# Patient Record
Sex: Female | Born: 1950 | Race: Black or African American | Hispanic: No | Smoking: Former smoker
Health system: Southern US, Community
[De-identification: ages and names within clinical notes are randomized; demographics above are authoritative.]

## PROBLEM LIST (undated history)

## (undated) DIAGNOSIS — F419 Anxiety disorder, unspecified: Secondary | ICD-10-CM

## (undated) DIAGNOSIS — I10 Essential (primary) hypertension: Secondary | ICD-10-CM

## (undated) DIAGNOSIS — K449 Diaphragmatic hernia without obstruction or gangrene: Secondary | ICD-10-CM

## (undated) DIAGNOSIS — H8110 Benign paroxysmal vertigo, unspecified ear: Secondary | ICD-10-CM

## (undated) DIAGNOSIS — R222 Localized swelling, mass and lump, trunk: Secondary | ICD-10-CM

## (undated) DIAGNOSIS — E042 Nontoxic multinodular goiter: Secondary | ICD-10-CM

## (undated) DIAGNOSIS — F329 Major depressive disorder, single episode, unspecified: Secondary | ICD-10-CM

## (undated) DIAGNOSIS — J189 Pneumonia, unspecified organism: Secondary | ICD-10-CM

## (undated) DIAGNOSIS — I639 Cerebral infarction, unspecified: Secondary | ICD-10-CM

## (undated) DIAGNOSIS — N3281 Overactive bladder: Secondary | ICD-10-CM

## (undated) DIAGNOSIS — Z972 Presence of dental prosthetic device (complete) (partial): Secondary | ICD-10-CM

## (undated) DIAGNOSIS — G890 Central pain syndrome: Secondary | ICD-10-CM

## (undated) DIAGNOSIS — M1990 Unspecified osteoarthritis, unspecified site: Secondary | ICD-10-CM

## (undated) DIAGNOSIS — K279 Peptic ulcer, site unspecified, unspecified as acute or chronic, without hemorrhage or perforation: Secondary | ICD-10-CM

## (undated) DIAGNOSIS — I69354 Hemiplegia and hemiparesis following cerebral infarction affecting left non-dominant side: Secondary | ICD-10-CM

## (undated) DIAGNOSIS — R51 Headache: Secondary | ICD-10-CM

## (undated) DIAGNOSIS — I719 Aortic aneurysm of unspecified site, without rupture: Secondary | ICD-10-CM

## (undated) DIAGNOSIS — G8929 Other chronic pain: Secondary | ICD-10-CM

## (undated) DIAGNOSIS — Z973 Presence of spectacles and contact lenses: Secondary | ICD-10-CM

## (undated) DIAGNOSIS — Z90710 Acquired absence of both cervix and uterus: Secondary | ICD-10-CM

## (undated) DIAGNOSIS — E669 Obesity, unspecified: Secondary | ICD-10-CM

## (undated) DIAGNOSIS — R519 Headache, unspecified: Secondary | ICD-10-CM

## (undated) DIAGNOSIS — F32A Depression, unspecified: Secondary | ICD-10-CM

## (undated) DIAGNOSIS — Z862 Personal history of diseases of the blood and blood-forming organs and certain disorders involving the immune mechanism: Secondary | ICD-10-CM

## (undated) DIAGNOSIS — N3941 Urge incontinence: Secondary | ICD-10-CM

## (undated) DIAGNOSIS — I351 Nonrheumatic aortic (valve) insufficiency: Secondary | ICD-10-CM

## (undated) DIAGNOSIS — M4316 Spondylolisthesis, lumbar region: Secondary | ICD-10-CM

## (undated) DIAGNOSIS — K219 Gastro-esophageal reflux disease without esophagitis: Secondary | ICD-10-CM

## (undated) DIAGNOSIS — E785 Hyperlipidemia, unspecified: Secondary | ICD-10-CM

## (undated) DIAGNOSIS — N182 Chronic kidney disease, stage 2 (mild): Secondary | ICD-10-CM

## (undated) DIAGNOSIS — Z974 Presence of external hearing-aid: Secondary | ICD-10-CM

## (undated) HISTORY — PX: ABDOMINAL HYSTERECTOMY: SHX81

## (undated) HISTORY — DX: Cerebral infarction, unspecified: I63.9

## (undated) HISTORY — DX: Obesity, unspecified: E66.9

## (undated) HISTORY — DX: Peptic ulcer, site unspecified, unspecified as acute or chronic, without hemorrhage or perforation: K27.9

## (undated) HISTORY — DX: Hyperlipidemia, unspecified: E78.5

## (undated) HISTORY — DX: Morbid (severe) obesity due to excess calories: E66.01

## (undated) HISTORY — DX: Essential (primary) hypertension: I10

## (undated) HISTORY — PX: TUBAL LIGATION: SHX77

## (undated) HISTORY — DX: Nonrheumatic aortic (valve) insufficiency: I35.1

## (undated) HISTORY — DX: Acquired absence of both cervix and uterus: Z90.710

---

## 1998-05-23 ENCOUNTER — Emergency Department (HOSPITAL_COMMUNITY): Admission: EM | Admit: 1998-05-23 | Discharge: 1998-05-23 | Payer: Self-pay | Admitting: Emergency Medicine

## 1998-06-06 ENCOUNTER — Other Ambulatory Visit: Admission: RE | Admit: 1998-06-06 | Discharge: 1998-06-06 | Payer: Self-pay | Admitting: Family Medicine

## 1998-09-11 ENCOUNTER — Ambulatory Visit (HOSPITAL_COMMUNITY): Admission: RE | Admit: 1998-09-11 | Discharge: 1998-09-11 | Payer: Self-pay | Admitting: Obstetrics

## 1998-10-13 ENCOUNTER — Encounter: Admission: RE | Admit: 1998-10-13 | Discharge: 1998-10-13 | Payer: Self-pay | Admitting: Internal Medicine

## 1998-10-15 ENCOUNTER — Encounter: Admission: RE | Admit: 1998-10-15 | Discharge: 1998-10-15 | Payer: Self-pay | Admitting: Internal Medicine

## 1998-11-29 HISTORY — PX: TUBAL LIGATION: SHX77

## 1999-09-14 ENCOUNTER — Emergency Department (HOSPITAL_COMMUNITY): Admission: EM | Admit: 1999-09-14 | Discharge: 1999-09-14 | Payer: Self-pay | Admitting: Emergency Medicine

## 1999-09-16 ENCOUNTER — Ambulatory Visit (HOSPITAL_COMMUNITY): Admission: RE | Admit: 1999-09-16 | Discharge: 1999-09-16 | Payer: Self-pay | Admitting: Obstetrics

## 2000-06-07 ENCOUNTER — Emergency Department (HOSPITAL_COMMUNITY): Admission: EM | Admit: 2000-06-07 | Discharge: 2000-06-08 | Payer: Self-pay | Admitting: Emergency Medicine

## 2000-08-27 ENCOUNTER — Emergency Department (HOSPITAL_COMMUNITY): Admission: EM | Admit: 2000-08-27 | Discharge: 2000-08-27 | Payer: Self-pay | Admitting: Emergency Medicine

## 2000-09-20 ENCOUNTER — Ambulatory Visit (HOSPITAL_COMMUNITY): Admission: RE | Admit: 2000-09-20 | Discharge: 2000-09-20 | Payer: Self-pay | Admitting: Internal Medicine

## 2001-10-04 ENCOUNTER — Ambulatory Visit (HOSPITAL_COMMUNITY): Admission: RE | Admit: 2001-10-04 | Discharge: 2001-10-04 | Payer: Self-pay | Admitting: Internal Medicine

## 2001-11-29 DIAGNOSIS — Z8711 Personal history of peptic ulcer disease: Secondary | ICD-10-CM

## 2001-11-29 HISTORY — DX: Personal history of peptic ulcer disease: Z87.11

## 2002-04-06 ENCOUNTER — Emergency Department (HOSPITAL_COMMUNITY): Admission: EM | Admit: 2002-04-06 | Discharge: 2002-04-06 | Payer: Self-pay | Admitting: Emergency Medicine

## 2002-05-11 ENCOUNTER — Observation Stay (HOSPITAL_COMMUNITY): Admission: EM | Admit: 2002-05-11 | Discharge: 2002-05-12 | Payer: Self-pay | Admitting: Emergency Medicine

## 2002-07-06 ENCOUNTER — Ambulatory Visit (HOSPITAL_COMMUNITY): Admission: RE | Admit: 2002-07-06 | Discharge: 2002-07-06 | Payer: Self-pay | Admitting: Gastroenterology

## 2002-07-06 ENCOUNTER — Encounter (INDEPENDENT_AMBULATORY_CARE_PROVIDER_SITE_OTHER): Payer: Self-pay | Admitting: *Deleted

## 2002-10-21 ENCOUNTER — Emergency Department (HOSPITAL_COMMUNITY): Admission: EM | Admit: 2002-10-21 | Discharge: 2002-10-21 | Payer: Self-pay | Admitting: Emergency Medicine

## 2002-10-23 ENCOUNTER — Ambulatory Visit (HOSPITAL_COMMUNITY): Admission: RE | Admit: 2002-10-23 | Discharge: 2002-10-23 | Payer: Self-pay | Admitting: Gastroenterology

## 2004-05-31 ENCOUNTER — Emergency Department (HOSPITAL_COMMUNITY): Admission: EM | Admit: 2004-05-31 | Discharge: 2004-05-31 | Payer: Self-pay | Admitting: Emergency Medicine

## 2004-09-14 ENCOUNTER — Ambulatory Visit (HOSPITAL_COMMUNITY): Admission: RE | Admit: 2004-09-14 | Discharge: 2004-09-14 | Payer: Self-pay | Admitting: Internal Medicine

## 2004-12-17 ENCOUNTER — Ambulatory Visit: Payer: Self-pay | Admitting: *Deleted

## 2004-12-17 ENCOUNTER — Ambulatory Visit: Payer: Self-pay | Admitting: Family Medicine

## 2004-12-21 ENCOUNTER — Ambulatory Visit: Payer: Self-pay | Admitting: Family Medicine

## 2004-12-25 ENCOUNTER — Emergency Department (HOSPITAL_COMMUNITY): Admission: EM | Admit: 2004-12-25 | Discharge: 2004-12-26 | Payer: Self-pay | Admitting: Emergency Medicine

## 2005-02-02 ENCOUNTER — Ambulatory Visit: Payer: Self-pay | Admitting: Family Medicine

## 2005-02-04 ENCOUNTER — Ambulatory Visit: Payer: Self-pay | Admitting: Family Medicine

## 2005-05-19 ENCOUNTER — Ambulatory Visit: Payer: Self-pay | Admitting: Family Medicine

## 2005-06-11 ENCOUNTER — Ambulatory Visit: Payer: Self-pay | Admitting: Family Medicine

## 2005-06-14 ENCOUNTER — Emergency Department (HOSPITAL_COMMUNITY): Admission: EM | Admit: 2005-06-14 | Discharge: 2005-06-14 | Payer: Self-pay | Admitting: Emergency Medicine

## 2005-06-17 ENCOUNTER — Emergency Department (HOSPITAL_COMMUNITY): Admission: EM | Admit: 2005-06-17 | Discharge: 2005-06-17 | Payer: Self-pay | Admitting: Emergency Medicine

## 2005-06-22 ENCOUNTER — Emergency Department (HOSPITAL_COMMUNITY): Admission: EM | Admit: 2005-06-22 | Discharge: 2005-06-22 | Payer: Self-pay | Admitting: Family Medicine

## 2005-09-16 ENCOUNTER — Ambulatory Visit (HOSPITAL_COMMUNITY): Admission: RE | Admit: 2005-09-16 | Discharge: 2005-09-16 | Payer: Self-pay | Admitting: Family Medicine

## 2006-03-29 DIAGNOSIS — I6930 Unspecified sequelae of cerebral infarction: Secondary | ICD-10-CM

## 2006-03-29 HISTORY — DX: Unspecified sequelae of cerebral infarction: I69.30

## 2006-04-17 ENCOUNTER — Inpatient Hospital Stay (HOSPITAL_COMMUNITY): Admission: AD | Admit: 2006-04-17 | Discharge: 2006-04-21 | Payer: Self-pay | Admitting: Neurology

## 2006-04-17 ENCOUNTER — Encounter: Payer: Self-pay | Admitting: Emergency Medicine

## 2006-04-19 ENCOUNTER — Ambulatory Visit: Payer: Self-pay | Admitting: Physical Medicine & Rehabilitation

## 2006-04-21 ENCOUNTER — Inpatient Hospital Stay (HOSPITAL_COMMUNITY)
Admission: RE | Admit: 2006-04-21 | Discharge: 2006-05-05 | Payer: Self-pay | Admitting: Physical Medicine & Rehabilitation

## 2006-09-23 ENCOUNTER — Ambulatory Visit (HOSPITAL_COMMUNITY): Admission: RE | Admit: 2006-09-23 | Discharge: 2006-09-23 | Payer: Self-pay | Admitting: Family Medicine

## 2007-03-01 ENCOUNTER — Inpatient Hospital Stay (HOSPITAL_COMMUNITY): Admission: EM | Admit: 2007-03-01 | Discharge: 2007-03-02 | Payer: Self-pay | Admitting: Emergency Medicine

## 2007-03-01 ENCOUNTER — Encounter: Payer: Self-pay | Admitting: Cardiology

## 2007-03-01 ENCOUNTER — Ambulatory Visit: Payer: Self-pay | Admitting: Cardiology

## 2007-04-23 ENCOUNTER — Emergency Department (HOSPITAL_COMMUNITY): Admission: EM | Admit: 2007-04-23 | Discharge: 2007-04-23 | Payer: Self-pay | Admitting: Emergency Medicine

## 2007-09-26 ENCOUNTER — Ambulatory Visit (HOSPITAL_COMMUNITY): Admission: RE | Admit: 2007-09-26 | Discharge: 2007-09-26 | Payer: Self-pay | Admitting: Family Medicine

## 2008-09-17 ENCOUNTER — Encounter: Admission: RE | Admit: 2008-09-17 | Discharge: 2008-09-17 | Payer: Self-pay | Admitting: Family Medicine

## 2008-10-03 ENCOUNTER — Ambulatory Visit (HOSPITAL_COMMUNITY): Admission: RE | Admit: 2008-10-03 | Discharge: 2008-10-03 | Payer: Self-pay | Admitting: Family Medicine

## 2008-11-20 ENCOUNTER — Encounter: Admission: RE | Admit: 2008-11-20 | Discharge: 2008-11-20 | Payer: Self-pay | Admitting: Family Medicine

## 2009-08-09 ENCOUNTER — Emergency Department (HOSPITAL_COMMUNITY): Admission: EM | Admit: 2009-08-09 | Discharge: 2009-08-09 | Payer: Self-pay | Admitting: Emergency Medicine

## 2009-10-07 ENCOUNTER — Ambulatory Visit (HOSPITAL_COMMUNITY): Admission: RE | Admit: 2009-10-07 | Discharge: 2009-10-07 | Payer: Self-pay | Admitting: Family Medicine

## 2009-12-05 ENCOUNTER — Ambulatory Visit: Payer: Self-pay | Admitting: Cardiology

## 2009-12-05 ENCOUNTER — Observation Stay (HOSPITAL_COMMUNITY): Admission: EM | Admit: 2009-12-05 | Discharge: 2009-12-07 | Payer: Self-pay | Admitting: Emergency Medicine

## 2009-12-07 ENCOUNTER — Encounter: Payer: Self-pay | Admitting: Cardiology

## 2009-12-24 DIAGNOSIS — E785 Hyperlipidemia, unspecified: Secondary | ICD-10-CM | POA: Insufficient documentation

## 2009-12-24 DIAGNOSIS — Z8679 Personal history of other diseases of the circulatory system: Secondary | ICD-10-CM | POA: Insufficient documentation

## 2009-12-24 DIAGNOSIS — I10 Essential (primary) hypertension: Secondary | ICD-10-CM | POA: Insufficient documentation

## 2009-12-24 DIAGNOSIS — Z8711 Personal history of peptic ulcer disease: Secondary | ICD-10-CM | POA: Insufficient documentation

## 2009-12-24 DIAGNOSIS — E669 Obesity, unspecified: Secondary | ICD-10-CM | POA: Insufficient documentation

## 2009-12-25 ENCOUNTER — Telehealth (INDEPENDENT_AMBULATORY_CARE_PROVIDER_SITE_OTHER): Payer: Self-pay

## 2009-12-29 ENCOUNTER — Encounter: Payer: Self-pay | Admitting: Cardiology

## 2009-12-29 ENCOUNTER — Ambulatory Visit: Payer: Self-pay

## 2009-12-29 ENCOUNTER — Encounter (HOSPITAL_COMMUNITY): Admission: RE | Admit: 2009-12-29 | Discharge: 2010-03-04 | Payer: Self-pay | Admitting: Cardiology

## 2009-12-29 ENCOUNTER — Ambulatory Visit (HOSPITAL_COMMUNITY): Admission: RE | Admit: 2009-12-29 | Discharge: 2009-12-29 | Payer: Self-pay | Admitting: Cardiology

## 2009-12-29 ENCOUNTER — Ambulatory Visit: Payer: Self-pay | Admitting: Cardiology

## 2010-01-06 ENCOUNTER — Ambulatory Visit: Payer: Self-pay | Admitting: Cardiology

## 2010-01-06 DIAGNOSIS — E041 Nontoxic single thyroid nodule: Secondary | ICD-10-CM | POA: Insufficient documentation

## 2010-01-06 DIAGNOSIS — E876 Hypokalemia: Secondary | ICD-10-CM | POA: Insufficient documentation

## 2010-01-09 ENCOUNTER — Ambulatory Visit (HOSPITAL_COMMUNITY): Admission: RE | Admit: 2010-01-09 | Discharge: 2010-01-09 | Payer: Self-pay | Admitting: Cardiology

## 2010-01-09 LAB — CONVERTED CEMR LAB
BUN: 15 mg/dL (ref 6–23)
CO2: 31 meq/L (ref 19–32)
Calcium: 9.4 mg/dL (ref 8.4–10.5)
Chloride: 105 meq/L (ref 96–112)
Creatinine, Ser: 1.1 mg/dL (ref 0.4–1.2)
GFR calc non Af Amer: 65.61 mL/min (ref >60)
Glucose, Bld: 88 mg/dL (ref 70–99)
Potassium: 3.8 meq/L (ref 3.5–5.1)
Sodium: 142 meq/L (ref 135–145)

## 2010-02-02 ENCOUNTER — Ambulatory Visit: Payer: Self-pay | Admitting: Cardiology

## 2010-02-11 LAB — CONVERTED CEMR LAB
BUN: 18 mg/dL (ref 6–23)
CO2: 31 meq/L (ref 19–32)
Calcium: 9.3 mg/dL (ref 8.4–10.5)
Chloride: 107 meq/L (ref 96–112)
Creatinine, Ser: 1.3 mg/dL — ABNORMAL HIGH (ref 0.4–1.2)
GFR calc non Af Amer: 54.09 mL/min (ref >60)
Glucose, Bld: 94 mg/dL (ref 70–99)
Potassium: 3.8 meq/L (ref 3.5–5.1)
Sodium: 143 meq/L (ref 135–145)

## 2010-07-29 ENCOUNTER — Ambulatory Visit: Payer: Self-pay | Admitting: Cardiovascular Disease

## 2010-07-29 ENCOUNTER — Ambulatory Visit: Payer: Self-pay

## 2010-07-29 ENCOUNTER — Ambulatory Visit (HOSPITAL_COMMUNITY): Admission: RE | Admit: 2010-07-29 | Discharge: 2010-07-29 | Payer: Self-pay | Admitting: Cardiology

## 2010-07-29 ENCOUNTER — Ambulatory Visit: Payer: Self-pay | Admitting: Cardiology

## 2010-07-29 ENCOUNTER — Encounter: Payer: Self-pay | Admitting: Cardiology

## 2010-07-29 DIAGNOSIS — R072 Precordial pain: Secondary | ICD-10-CM | POA: Insufficient documentation

## 2010-07-29 DIAGNOSIS — I359 Nonrheumatic aortic valve disorder, unspecified: Secondary | ICD-10-CM | POA: Insufficient documentation

## 2010-08-05 ENCOUNTER — Ambulatory Visit: Payer: Self-pay | Admitting: Cardiology

## 2010-08-06 ENCOUNTER — Telehealth: Payer: Self-pay | Admitting: Cardiology

## 2010-08-07 ENCOUNTER — Ambulatory Visit: Payer: Self-pay | Admitting: Cardiology

## 2010-08-07 ENCOUNTER — Inpatient Hospital Stay (HOSPITAL_BASED_OUTPATIENT_CLINIC_OR_DEPARTMENT_OTHER): Admission: RE | Admit: 2010-08-07 | Discharge: 2010-08-07 | Payer: Self-pay | Admitting: Cardiology

## 2010-08-07 HISTORY — PX: CARDIAC CATHETERIZATION: SHX172

## 2010-08-09 ENCOUNTER — Telehealth: Payer: Self-pay | Admitting: Adult Health

## 2010-08-10 ENCOUNTER — Telehealth: Payer: Self-pay | Admitting: Cardiology

## 2010-08-11 ENCOUNTER — Ambulatory Visit: Payer: Self-pay | Admitting: Cardiology

## 2010-08-12 ENCOUNTER — Telehealth: Payer: Self-pay | Admitting: Cardiology

## 2010-08-16 LAB — CONVERTED CEMR LAB
BUN: 23 mg/dL (ref 6–23)
Basophils Absolute: 0 10*3/uL (ref 0.0–0.1)
Basophils Relative: 0.6 % (ref 0.0–3.0)
CO2: 29 meq/L (ref 19–32)
Calcium: 9.3 mg/dL (ref 8.4–10.5)
Chloride: 103 meq/L (ref 96–112)
Creatinine, Ser: 1.3 mg/dL — ABNORMAL HIGH (ref 0.4–1.2)
Eosinophils Absolute: 0.2 10*3/uL (ref 0.0–0.7)
Eosinophils Relative: 4 % (ref 0.0–5.0)
GFR calc non Af Amer: 54.97 mL/min (ref >60)
Glucose, Bld: 83 mg/dL (ref 70–99)
HCT: 35.4 % — ABNORMAL LOW (ref 36.0–46.0)
Hemoglobin: 11.9 g/dL — ABNORMAL LOW (ref 12.0–15.0)
INR: 1 (ref 0.8–1.0)
Lymphocytes Relative: 25.7 % (ref 12.0–46.0)
Lymphs Abs: 1.2 10*3/uL (ref 0.7–4.0)
MCHC: 33.7 g/dL (ref 30.0–36.0)
MCV: 90 fL (ref 78.0–100.0)
Monocytes Absolute: 0.4 10*3/uL (ref 0.1–1.0)
Monocytes Relative: 9.2 % (ref 3.0–12.0)
Neutro Abs: 2.8 10*3/uL (ref 1.4–7.7)
Neutrophils Relative %: 60.5 % (ref 43.0–77.0)
Platelets: 341 10*3/uL (ref 150.0–400.0)
Potassium: 4.3 meq/L (ref 3.5–5.1)
Prothrombin Time: 10.6 s (ref 9.7–11.8)
RBC: 3.94 M/uL (ref 3.87–5.11)
RDW: 13.6 % (ref 11.5–14.6)
Sodium: 141 meq/L (ref 135–145)
WBC: 4.7 10*3/uL (ref 4.5–10.5)

## 2010-09-21 ENCOUNTER — Ambulatory Visit: Payer: Self-pay | Admitting: Cardiology

## 2010-09-22 ENCOUNTER — Encounter: Payer: Self-pay | Admitting: Cardiology

## 2010-10-12 ENCOUNTER — Ambulatory Visit (HOSPITAL_COMMUNITY): Admission: RE | Admit: 2010-10-12 | Discharge: 2010-10-12 | Payer: Self-pay | Admitting: Family Medicine

## 2010-12-29 NOTE — Assessment & Plan Note (Signed)
Summary: eph/post myoview/echo and carotid/lg   Visit Type:  Post-Hospital  CC:  No cardiac complains.  History of Present Illness: Has been doing well since she went home.  She follows her grandkids.  She feels better except for a back ache.  Patient was admitted to the hospital for chest pain.  She was seen by Dr. Dietrich Pates, who arranged OP evaluation.  Has not been seen in some time.  Has been doing well since time of discharge.  Current Medications (verified): 1)  Aspirin 81 Mg Tbec (Aspirin) .... Take One Tablet By Mouth Daily 2)  Bumetanide 1 Mg Tabs (Bumetanide) .... Take 1 Tablet By Mouth Once A Day 3)  Cardura 8 Mg Tabs (Doxazosin Mesylate) .... Take 1 Tab By Mouth At Bedtime 4)  Clonidine Hcl 0.3 Mg Tabs (Clonidine Hcl) .... Take One Tablet By Mouth Three Times A Day 5)  Enablex 7.5 Mg Xr24h-Tab (Darifenacin Hydrobromide) .... Take 1 Tablet By Mouth Once A Day 6)  Exforge 10-160 Mg Tabs (Amlodipine Besylate-Valsartan) .... Take 1 Tablet By Mouth Once A Day 7)  Furosemide 40 Mg Tabs (Furosemide) .... Take One Tablet By Mouth Daily. 8)  Lisinopril 10 Mg Tabs (Lisinopril) .... Take One Tablet By Mouth Daily 9)  Multivitamins  Tabs (Multiple Vitamin) .... Take 1 Tablet By Mouth Once A Day 10)  Omeprazole 20 Mg Tbec (Omeprazole) .... Take 1 Tablet By Mouth Two Times A Day 11)  Verapamil Hcl 80 Mg Tabs (Verapamil Hcl) .... Take 1 Tablet By Mouth Two Times A Day 12)  Vitamin B-12 100 Mcg Tabs (Cyanocobalamin) .... Take 1 Tablet By Mouth Once A Day 13)  Zetia 10 Mg Tabs (Ezetimibe) .... Take One Tablet By Mouth Daily.  Allergies (verified): No Known Drug Allergies  Vital Signs:  Patient profile:   60 year old female Height:      67 inches Weight:      197.50 pounds BMI:     31.04 Pulse rate:   76 / minute Pulse rhythm:   regular Resp:     18 per minute BP sitting:   108 / 64  (right arm) Cuff size:   large  Vitals Entered By: Vikki Ports (January 06, 2010 12:39  PM)  Physical Exam  General:  Well developed, well nourished, in no acute distress. Head:  normocephalic and atraumatic Lungs:  Clear bilaterally to auscultation and percussion. Heart:  PMI non displaced.  Minimal apical murmur, no loud MR heard. Abdomen:  Bowel sounds positive; abdomen soft and non-tender without masses, organomegaly, or hernias noted. No hepatosplenomegaly. Pulses:  pulses normal in all 4 extremities Extremities:  No clubbing or cyanosis. Neurologic:  Alert and oriented x 3.   Echocardiogram  Procedure date:  12/29/2009  Findings:       Study Conclusions            - Left ventricle: The cavity size was normal. Wall thickness was       normal. The estimated ejection fraction was 60%. Wall motion was       normal; there were no regional wall motion abnormalities.     - Aortic valve: The aortic valve is trileaflet. There is a central       AI jet. The AI is mild/moderate.     - Mitral valve: Mild regurgitation.     - Left atrium: The atrium was mildly dilated.     - Tricuspid valve: Mild-moderate regurgitation.  Nuclear ETT  Procedure date:  12/29/2009  Findings:       QPS  Raw Data Images:  Normal; no motion artifact; normal heart/lung ratio. Stress Images:  There is normal uptake in all areas. Rest Images:  Normal homogeneous uptake in all areas of the myocardium. Subtraction (SDS):  No evidence of ischemia. Transient Ischemic Dilatation:  1.20  (Normal <1.22)  Lung/Heart Ratio:  .24  (Normal <0.45)  Quantitative Gated Spect Images  QGS EDV:  119 ml QGS ESV:  55 ml QGS EF:  54 % QGS cine images:  Normal  Carotid Doppler  Procedure date:  12/29/2009  Findings:      Normal Carotids. Bilateral Thyroid Cysts.  Referred for thyroid US and discussed with Dr. Gery Pray.  Impression & Recommendations:  Problem # 1:  CHEST PAIN, HX OF (ICD-V12.50) See nuc study.  No evidence of ischemia by evaluation.  Continue to follow up in six months.  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 Orders: Misc. Referral (Misc. Ref)  Problem # 3:  HYPOKALEMIA (ICD-276.8) Recheck Orders: TLB-BMP (Basic Metabolic Panel-BMET) (80048-METABOL)  Problem # 4:  HYPERTENSION, UNSPECIFIED (ICD-401.9) Continue to monitor BP.  Check k Her updated medication list for this problem includes:    Aspirin 81 Mg Tbec (Aspirin) .Marland Kitchen... Take one tablet by mouth daily    Bumetanide 1 Mg Tabs (Bumetanide) .Marland Kitchen... Take 1 tablet by mouth once a day    Cardura 8 Mg Tabs (Doxazosin mesylate) .Marland Kitchen... Take 1 tab by mouth at bedtime    Clonidine Hcl 0.3 Mg Tabs (Clonidine hcl) .Marland Kitchen... Take one tablet by mouth three times a day    Exforge 10-160 Mg Tabs (Amlodipine besylate-valsartan) .Marland Kitchen... Take 1 tablet by mouth once a day    Furosemide 40 Mg Tabs (Furosemide) .Marland Kitchen... Take one tablet by mouth daily.    Lisinopril 10 Mg Tabs (Lisinopril) .Marland Kitchen... Take one tablet by mouth daily    Verapamil Hcl 80 Mg Tabs (Verapamil hcl) .Marland Kitchen... Take 1 tablet by mouth two times a day  Orders: Echocardiogram (Echo)  Patient Instructions: 1)  Your physician recommends that you schedule a follow-up appointment in: 6 months with Dr. Riley Kill 2)  Your physician recommends that you get lab work today to check your potassium level. 3)  You have been referred to [facility] to have an ultrasound of the thyroid to evaluate your thyroid cysts. 4)  Your physician has requested that you have an echocardiogram in 6 months, prior to your next appointment.  Echocardiography is a painless test that uses sound waves to create images of your heart. It provides your doctor with information about the size and shape of your heart and how well your heart's chambers and valves are working.  This procedure takes approximately one hour. There are no restrictions for this procedure.

## 2010-12-29 NOTE — Progress Notes (Signed)
Summary: CP, med clarification  Phone Note Outgoing Call   Call placed by: Julieta Gutting, RN, BSN,  August 06, 2010 11:44 AM Call placed to: Patient Summary of Call: I spoke with the pt and she is not taking Furosemide. The pt does take Bumetanide 1mg  daily.  The pt made me aware that she had CP on Tuesay, Wednesday night, and is currently having pain.  I advised the pt that she should go to the ER now and not wait for her cath tomorrow.  The pt does not want to go into the hospital today.  I once again advised the pt that she should seek treatment in the ER for her symptoms.  The pt said she would go to the ER if her symptoms worsened. The pt said she has been taking 4 baby ASA daily for pain. Initial call taken by: Julieta Gutting, RN, BSN,  August 06, 2010 11:45 AM

## 2010-12-29 NOTE — Consult Note (Signed)
Summary: Consultation Report - [facility]  Consultation Report - [facility]   Imported By: Marylou Mccoy 12/26/2009 09:02:43  _____________________________________________________________________  External Attachment:    Type:   Image     Comment:   External Document

## 2010-12-29 NOTE — Assessment & Plan Note (Signed)
Summary: Cardiology Nuclear Study  Nuclear Med Background Indications for Stress Test: Evaluation for Ischemia, Post Hospital  Indications Comments: 12/05/09 to 12/07/09 CP, MI ruled out.  History: Echo  History Comments: '08 Echo: EF=55-60%, Mild AI.  Symptoms: Chest Pain, Chest Pressure, SOB  Symptoms Comments: Radiates to Left shoulder.   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 Edwards,RN.   Nuclear Med Study 1 or 2 day study:  1 day     Stress Test Type:  Eugenie Birks Reading MD:  Willa Rough, MD     Referring MD:  T.Stuckey Resting Radionuclide:  Technetium 42m Tetrofosmin     Resting Radionuclide Dose:  11.0 mCi  Stress Radionuclide:  Technetium 35m Tetrofosmin     Stress Radionuclide Dose:  33.0 mCi   Stress Protocol   Lexiscan: 0.4 mg   Stress Test Technologist:  Milana Na EMT-P     Nuclear Technologist:  Burna Mortimer Deal RT-N  Rest Procedure  Myocardial perfusion imaging was performed at rest 45 minutes following the intravenous administration of Myoview Technetium 53m Tetrofosmin.  Stress Procedure  The patient received IV Lexiscan 0.4 mg over 15-seconds.  Myoview injected at 30-seconds.  There were no significant changes with infusion.  Quantitative spect images were obtained after a 45 minute delay.  QPS Raw Data Images:  Normal; no motion artifact; normal heart/lung ratio. Stress Images:  There is normal uptake in all areas. Rest Images:  Normal homogeneous uptake in all areas of the myocardium. Subtraction (SDS):  No evidence of ischemia. Transient Ischemic Dilatation:  1.20  (Normal  <1.22)  Lung/Heart Ratio:  .24  (Normal <0.45)  Quantitative Gated Spect Images QGS EDV:  119 ml QGS ESV:  55 ml QGS EF:  54 % QGS cine images:  Normal  Findings Normal nuclear study      Overall Impression  Exercise Capacity: Lexiscan myoview BP Response: Normal blood pressure response. Clinical Symptoms: headache ECG Impression: No significant ST segment change suggestive of ischemia. Overall Impression: Normal stress nuclear study.  Appended Document: Cardiology Nuclear Study Report of study reviewed with patient at time of office visit.  TS

## 2010-12-29 NOTE — Progress Notes (Signed)
Summary: Nuc. Pre-Procedure  Phone Note Outgoing Call Call back at [facility] Phone [phone]   Call placed by: Irean Hong, RN,  December 25, 2009 1:44 PM Summary of Call: Reviewed information on Myoview Information Sheet (see scanned document for further details).  Spoke with patient.     Nuclear Med Background Indications for Stress Test: Evaluation for Ischemia, Post Hospital  Indications Comments: 12/05/09 to 12/07/09 CP, MI ruled out.  History: Echo  History Comments: '08 Echo: EF=55-60%, Mild AI.  Symptoms: Chest Pain, Chest Pressure, SOB  Symptoms Comments: Radiates to Left shoulder.   Nuclear Pre-Procedure Cardiac Risk Factors: CVA, Hypertension, Obesity

## 2010-12-29 NOTE — Miscellaneous (Signed)
Summary: Orders Update  Clinical Lists Changes  Orders: Added new Test order of Carotid Duplex (Carotid Duplex) - Signed 

## 2010-12-29 NOTE — Progress Notes (Signed)
Summary: talk to nurse re visit yesterday  Phone Note Call from Patient   Caller: Patient Reason for Call: Talk to Nurse Summary of Call: pt wants a call re her visit yesterday-pls call [phone] Initial call taken by: Glynda Jaeger,  August 12, 2010 11:24 AM  Follow-up for Phone Call        The pt called because she was wondering what she needs to have done for her back pain.  Dr Riley Kill did speak with her about this issue at her OV. He instructed the pt to discuss these concerns with Dr Parke Simmers and that she may require further evaluation of her back by MRI.  Dr Riley Kill said he would touch base with Dr Parke Simmers prior to the pt's appt on Monday.  Follow-up by: Julieta Gutting, RN, BSN,  August 12, 2010 12:36 PM

## 2010-12-29 NOTE — Letter (Signed)
Summary: Cardiac Catheterization Instructions- [facility]  [employer], Main Office  [address]   [place], [place] [address]   Phone: [phone]  Fax: [phone]     07/29/2010 MRN: [id number]  ZEPHYRA BERNARDI [address] [place], [place]  [address]  Dear Ms. Slevin,   You are scheduled for a Cardiac Catheterization on August 07, 2010 with Dr.Sherelle Castelli. Please arrive to the 1st floor of the Heart and Vascular Center at [facility] at 7:30 am  on the day of your procedure. Please do not arrive before 6:30 a.m. Call the Heart and Vascular Center at [phone] if you are unable to make your appointmnet. The Code to get into the parking garage under the building is 0020_. Take the elevators to the 1st floor. You must have someone to drive you home. Someone must be with you for the first 24 hours after you arrive home. Please wear clothes that are easy to get on and off and wear slip-on shoes. Do not eat or drink after midnight except water with your medications that morning. Bring all your medications and current insurance cards with you.   __X_ Make sure you take your aspirin.  _X__ You may take ALL of your medications with water that morning. _ Please come to the office on August 05, 2010 for lab work. The lab opens at 8:30 and closes from 2:00-2:30. It is open until 4:15.  The usual length of stay after your procedure is 2 to 3 hours. This can vary.  If you have any questions, please call the office at the number listed above.   Dossie Arbour, RN, BSN

## 2010-12-29 NOTE — Progress Notes (Signed)
  Phone Note Call from Patient   Action Taken: Phone Call Completed Details for Reason: Chest pain  Summary of Call: Recieved a page to return call of Chelsea [name].  I called back twice and no answer.  She called back and reported that she was having chest pain, only when she was lying down.  She has had a cardiac catherization on 08/07/2010 and was found to have normal coronary anatomy.  She is not SOB, diaphoretic or dizzy.  I have reassured her that it appeared to be musculoskeletal and she should take tylenol or advil for relief.  She has an appointment this week with Dr. Riley Kill.  I have assured her that I would pass on this information to him to discuss with her on that visit. Initial call taken by: Joni Reining NP

## 2010-12-29 NOTE — Cardiovascular Report (Signed)
Summary: Pre Cath Orders   Pre Cath Orders   Imported By: Roderic Ovens 08/17/2010 13:29:22  _____________________________________________________________________  External Attachment:    Type:   Image     Comment:   External Document

## 2010-12-29 NOTE — Assessment & Plan Note (Signed)
Summary: eph   Visit Type:  Post-hospital  CC:  Chest discomfort- Left arm warm funny feeling goes down to left leg- Some bleeding from the cath site  and Sob.  History of Present Illness: Ms. Cummings comes in for followup after undergoing cardiac cath.  This was done in part to a possible change in wall motion on echo.  Cath revealed no significant obstruction, and also preserved LV function.  AI was mild, and right heart pressures normal.  Previous workup has included CT.  She continues to have episodes of chest pain.  They radiate into the back, and there is some tenderness.  We had held off for quite some time in doing cath, but recent symptoms seem more prominent.  She has been concerned.  We spend a long time today discussing this.  She is to see Dr. Parke Simmers on Monday.  There is not prominent shortness of breath, nausea, or vomiting.  GI symptoms are not prominent.   Current Medications (verified): 1)  Aspirin 81 Mg Tbec (Aspirin) .... Take One Tablet By Mouth Daily 2)  Bumetanide 1 Mg Tabs (Bumetanide) .... Take 1 Tablet By Mouth Once A Day 3)  Cardura 8 Mg Tabs (Doxazosin Mesylate) .... Take 1 Tab By Mouth At Bedtime 4)  Clonidine Hcl 0.3 Mg Tabs (Clonidine Hcl) .... Take One Tablet By Mouth Three Times A Day 5)  Enablex 7.5 Mg Xr24h-Tab (Darifenacin Hydrobromide) .... Take 1 Tablet By Mouth Once A Day 6)  Exforge 10-160 Mg Tabs (Amlodipine Besylate-Valsartan) .... Take 1 Tablet By Mouth Once A Day 7)  Lisinopril 10 Mg Tabs (Lisinopril) .... Take One Tablet By Mouth Daily 8)  Multivitamins  Tabs (Multiple Vitamin) .... Take 1 Tablet By Mouth Once A Day 9)  Omeprazole 20 Mg Tbec (Omeprazole) .... Take 1 Tablet By Mouth Two Times A Day 10)  Verapamil Hcl 80 Mg Tabs (Verapamil Hcl) .... Take 1 Tablet By Mouth Two Times A Day 11)  Vitamin B-12 100 Mcg Tabs (Cyanocobalamin) .... Take 1 Tablet By Mouth Once A Day 12)  Zetia 10 Mg Tabs (Ezetimibe) .... Take One Tablet By Mouth Daily. 13)   Potassium Chloride Cr 10 Meq Cr-Caps (Potassium Chloride) .... Take One Tablet By Mouth Daily 14)  Meloxicam 15 Mg Tabs (Meloxicam) .... Take 1 Tablet By Mouth Once A Day  Allergies (verified): No Known Drug Allergies  Past History:  Past Medical History: Last updated: 01-09-2010 Current Problems:  HYPERTENSION, UNSPECIFIED (ICD-401.9) HYPERLIPIDEMIA (ICD-272.4) CEREBROVASCULAR ACCIDENT, HX OF (ICD-V12.50) CHEST PAIN, HX OF (ICD-V12.50) OBESITY (ICD-278.00) PEPTIC ULCER DISEASE, HX OF (ICD-V12.71)  Past Surgical History: Last updated: Jan 09, 2010 Tubal ligation Hysterectomy  Family History: Last updated: 01/09/10  Mother died of hypertension and peripheral vascular   disease.  Father died due to cirrhosis.  A grandmother died due to   myocardial infarction.      Social History: Last updated: 09-Jan-2010  Married and resides locally.  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  She does not use tobacco products nor  alcohol.  She uses no illicit drugs.      Vital Signs:  Patient profile:   60 year old female Height:      67 inches Weight:      193 pounds BMI:     30.34 Pulse rate:   69 / minute Pulse rhythm:   regular Resp:     18 per minute BP sitting:   120 / 80  (right arm) Cuff size:  large  Vitals Entered By: Vikki Ports (August 11, 2010 3:59 PM)  Physical Exam  General:  Well developed, well nourished, in no acute distress. Head:  normocephalic and atraumatic Eyes:  PERRLA/EOM intact; conjunctiva and lids normal. Chest Wall:  Slight tenderness over L parasternal area. Lungs:  Clear bilaterally to auscultation and percussion. Heart:  Normal S1 and S2.  Minimal AI murmur.  No def rub, or gallop. Abdomen:  Bowel sounds positive; abdomen soft and non-tender without masses, organomegaly, or hernias noted. No hepatosplenomegaly. Extremities:  No clubbing or cyanosis.  Cath site looks ok. Neurologic:  Alert and oriented x  3.   EKG  Procedure date:  08/11/2010  Findings:      NSR.  Voltage criteria for LVH.  Cardiac Cath  Procedure date:  08/07/2010  Findings:      ANGIOGRAPHIC DATA: 1. Aortic root aortography revealed a normal-sized aortic root     leaflet, motion appeared to be relatively normal.  There was at     least 1-2 utmost possibly 2+ aortic regurgitation noted. 2. Ventriculography was done in the RAO projection.  Overall systolic     function appeared to be in the lower limits of normal with EF in     the range of about 55%.  No definite wall motion abnormalities were     seen.  There may be minimal hangup of the apical dissection. 3. Left main is free of critical disease. 4. Left anterior descending artery courses to the apex.  There is a     modest size diagonal branch and septal perforators, all of which     appear free of critical disease. 5. There is a small ramus intermedius free of disease. 6. There is a dominant circumflex providing two marginals in the     posterior descending all of which appear to be smooth. 7. The right coronary artery is nondominant but without significant     focal obstruction.   IMPRESSION: 1. Normal to low normal overall ejection fraction. 2. Mild aortic regurgitation. 3. No significant coronary obstruction.   PLAN: 1. The patient will be seen back in the office for followup visit. 2. A D-dimer will be obtained. 3. We will need to seek other causes of chest pain. 4. Her aortic regurgitation will need to be followed over time. 5. We will need to revise her medication list.         Arturo Morton. Riley Kill, MD, [facility]        CT of Chest  Procedure date:  12/06/2009  Findings:       Comparison:  Chest x-ray of 12/05/2009    Findings:  The pulmonary arteries opacify and there is no evidence   of acute pulmonary embolism.  The thoracic aorta opacifies with no   acute abnormality.  The ascending aorta measures 37 mm in diameter.   No  mediastinal or hilar adenopathy is seen.  There is mild   cardiomegaly present.    On the lung window images, mild dependent basilar atelectasis is   noted.  No bony abnormality is seen.    Review of the MIP images confirms the above findings.    IMPRESSION:    1.  No evidence of pulmonary embolism.   2.  Mild basilar atelectasis.  No infiltrate or effusion.   3.  Cardiomegaly.   Impression & Recommendations:  Problem # 1:  CHEST PAIN-PRECORDIAL (ICD-786.51) Not really sure of source of pain.  She will see Dr. Parke Simmers for  followup on Monday.  Will review with her.  Am concerned about use of NSAIDS in light of everything else, and we discussed potential other workup.  Cor angio does not demonstrate sig CAD, and right heart pressures are normal.  ?MS in etiology.  Will review with Dr. Parke Simmers.   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 Take 1 tablet by mouth once a day    Exforge 10-160 Mg Tabs (Amlodipine besylate-valsartan) .Marland Kitchen... Take 1 tablet by mouth once a day    Lisinopril 10 Mg Tabs (Lisinopril) .Marland Kitchen... Take one tablet by mouth daily  Problem # 3:  HYPERLIPIDEMIA (ICD-272.4) Followed by Dr. Parke Simmers. Her updated medication list for this problem includes:    Zetia 10 Mg Tabs (Ezetimibe) .Marland Kitchen... Take one tablet by mouth daily.  Problem # 4:  HYPERTENSION, UNSPECIFIED (ICD-401.9) see med profile. Her updated medication list for this problem includes:    Aspirin 81 Mg Tbec (Aspirin) .Marland Kitchen... Take one tablet by mouth daily    Bumetanide 1 Mg Tabs (Bumetanide) .Marland Kitchen... Take 1 tablet by mouth once a day    Cardura 8 Mg Tabs (Doxazosin mesylate) .Marland Kitchen... Take 1 tab by mouth at bedtime    Clonidine Hcl 0.3 Mg Tabs (Clonidine hcl) .Marland Kitchen... Take one tablet by mouth three times a day    Exforge 10-160 Mg Tabs (Amlodipine  besylate-valsartan) .Marland Kitchen... Take 1 tablet by mouth once a day    Lisinopril 10 Mg Tabs (Lisinopril) .Marland Kitchen... Take one tablet by mouth daily    Verapamil Hcl 80 Mg Tabs (Verapamil hcl) .Marland Kitchen... Take 1 tablet by mouth two times a day  Patient Instructions: 1)  Your physician recommends that you schedule a follow-up appointment in: 6 WEEKS 2)  Your physician recommends that you continue on your current medications as directed. Please refer to the Current Medication list given to you today. DO NOT TAKE ADVIL OR ALEVE FOR PAIN

## 2010-12-29 NOTE — Progress Notes (Signed)
Summary: chest discomfort  Phone Note Call from Patient   Caller: Patient Reason for Call: Talk to Nurse Summary of Call: pt has an appt with dr Riley Kill tomorrow but wants to talk to lauren re chest discomfort she has today-pls call [phone] Initial call taken by: Glynda Jaeger,  August 10, 2010 1:46 PM  Follow-up for Phone Call        I spoke with the pt and she ic/o the same type of CP she has been having and would like to know what she can do about these symptoms. The pt does c/o discomfort when she takes a deep breath.  I spoke with Dr Riley Kill and he would like the pt to come into the office today.  The pt cannot come into the office today because she does not have transportation.  Dr Riley Kill reviewed her hospital records and advised that she should keep her appt tomorrow and we would decide about further testing at that appt. Pt agreed with plan.  I advised the pt to go into the ER is she had worsening symptoms.   Follow-up by: Julieta Gutting, RN, BSN,  August 10, 2010 2:42 PM

## 2010-12-29 NOTE — Assessment & Plan Note (Signed)
Summary: F6W   Visit Type:  Follow-up Primary Provider:  Keenan Bachelor  CC:  Dizzy spell yesterday.  History of Present Illness: Dr. Parke Simmers has been cutting her medication back a bit.  She did have a little dizziness yesterday.  Overall, she is doing better and all of her chest pain has resolved. She feels better.  She has been losing some weight, resulting in a bit drop in her BP. Checked by me today her BP was 110/70, and of note she says she checks with a digital machine at home and the numbers are running about 120 systolic.    Current Medications (verified): 1)  Aspirin 81 Mg Tbec (Aspirin) .... Take One Tablet By Mouth Daily 2)  Bumetanide 1 Mg Tabs (Bumetanide) .... Take 1 Tablet By Mouth Once A Day 3)  Cardura 8 Mg Tabs (Doxazosin Mesylate) .... Take 1 Tab By Mouth At Bedtime 4)  Clonidine Hcl 0.3 Mg Tabs (Clonidine Hcl) .... Take One Tablet By Mouth Two Times A Day 5)  Enablex 7.5 Mg Xr24h-Tab (Darifenacin Hydrobromide) .... Take 1 Tablet By Mouth Once A Day 6)  Exforge 10-160 Mg Tabs (Amlodipine Besylate-Valsartan) .... Take 1 Tablet By Mouth Once A Day 7)  Lisinopril 10 Mg Tabs (Lisinopril) .... Take One Tablet By Mouth Daily 8)  Multivitamins  Tabs (Multiple Vitamin) .... Take 1 Tablet By Mouth Once A Day 9)  Omeprazole 20 Mg Tbec (Omeprazole) .... Take 1 Tablet By Mouth Two Times A Day 10)  Verapamil Hcl 80 Mg Tabs (Verapamil Hcl) .... Take 1 Tablet By Mouth Two Times A Day 11)  Vitamin B-12 100 Mcg Tabs (Cyanocobalamin) .... Take 1 Tablet By Mouth Once A Day 12)  Zetia 10 Mg Tabs (Ezetimibe) .... Take One Tablet By Mouth Daily. 13)  Potassium Chloride Cr 10 Meq Cr-Caps (Potassium Chloride) .... Take One Tablet By Mouth Daily 14)  Meloxicam 15 Mg Tabs (Meloxicam) .... Take 1 Tablet By Mouth Once A Day  Allergies (verified): No Known Drug Allergies  Past History:  Past Medical History: Last updated: 03-Jan-2010 Current Problems:  HYPERTENSION, UNSPECIFIED  (ICD-401.9) HYPERLIPIDEMIA (ICD-272.4) CEREBROVASCULAR ACCIDENT, HX OF (ICD-V12.50) CHEST PAIN, HX OF (ICD-V12.50) OBESITY (ICD-278.00) PEPTIC ULCER DISEASE, HX OF (ICD-V12.71)  Past Surgical History: Last updated: 01-03-10 Tubal ligation Hysterectomy  Family History: Last updated: 2010/01/03  Mother died of hypertension and peripheral vascular   disease.  Father died due to cirrhosis.  A grandmother died due to   myocardial infarction.      Social History: Last updated: 03-Jan-2010  Married and resides locally.  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  She does not use tobacco products nor  alcohol.  She uses no illicit drugs.      Vital Signs:  Patient profile:   60 year old female Height:      67 inches Weight:      187.50 pounds BMI:     29.47 Pulse rate:   57 / minute Pulse rhythm:   regular Resp:     18 per minute BP sitting:   102 / 68  (right arm) Cuff size:   large  Vitals Entered By: Vikki Ports (September 21, 2010 11:46 AM)  Physical Exam  General:  Well developed, well nourished, in no acute distress. Head:  normocephalic and atraumatic Eyes:  PERRLA/EOM intact; conjunctiva and lids normal. Lungs:  Clear bilaterally to auscultation and percussion. Heart:  PMI non displaced. Normal S1 and S2.  Minimal SEM.  Pulses:  pulses normal in all 4 extremities Extremities:  No clubbing or cyanosis. Neurologic:  Alert and oriented x 3.   EKG  Procedure date:  09/21/2010  Findings:      NSR.   Borderline LVH.  Otherwise unremarkable.    Impression & Recommendations:  Problem # 1:  CHEST PAIN-PRECORDIAL (ICD-786.51) Hsa entirely resolved.  Source was unclear, but clearly improved at present time.  See cath results from last office visit.  Her updated medication list for this problem includes:    Aspirin 81 Mg Tbec (Aspirin) .Marland Kitchen... Take one tablet by mouth daily    Exforge 10-160 Mg Tabs (Amlodipine besylate-valsartan) .Marland Kitchen... Take 1  tablet by mouth once a day    Lisinopril 10 Mg Tabs (Lisinopril) .Marland Kitchen... Take one tablet by mouth daily    Verapamil Hcl 80 Mg Tabs (Verapamil hcl) .Marland Kitchen... Take 1 tablet by mouth two times a day  Orders: EKG w/ Interpretation (93000)  Problem # 2:  AORTIC STENOSIS/ INSUFFICIENCY, NON-RHEUMATIC (ICD-424.1) Mild AI by cath.  Continue to monitor in follow up visits. Her updated medication list for this problem includes:    Bumetanide 1 Mg Tabs (Bumetanide) .Marland Kitchen... Take 1 tablet by mouth once a day    Exforge 10-160 Mg Tabs (Amlodipine besylate-valsartan) .Marland Kitchen... Take 1 tablet by mouth once a day    Lisinopril 10 Mg Tabs (Lisinopril) .Marland Kitchen... Take one tablet by mouth daily  Orders: EKG w/ Interpretation (93000)  Problem # 3:  HYPERTENSION, UNSPECIFIED (ICD-401.9) Dr. Parke Simmers is watching her, and has cut back a bit of her meds.  Will need to watched closely. Her updated medication list for this problem includes:    Aspirin 81 Mg Tbec (Aspirin) .Marland Kitchen... Take one tablet by mouth daily    Bumetanide 1 Mg Tabs (Bumetanide) .Marland Kitchen... Take 1 tablet by mouth once a day    Cardura 8 Mg Tabs (Doxazosin mesylate) .Marland Kitchen... Take 1 tab by mouth at bedtime    Clonidine Hcl 0.3 Mg Tabs (Clonidine hcl) .Marland Kitchen... Take one tablet by mouth two times a day    Exforge 10-160 Mg Tabs (Amlodipine besylate-valsartan) .Marland Kitchen... Take 1 tablet by mouth once a day    Lisinopril 10 Mg Tabs (Lisinopril) .Marland Kitchen... Take one tablet by mouth daily    Verapamil Hcl 80 Mg Tabs (Verapamil hcl) .Marland Kitchen... Take 1 tablet by mouth two times a day  Problem # 4:  HYPERLIPIDEMIA (ICD-272.4) treated without statin. Her updated medication list for this problem includes:    Zetia 10 Mg Tabs (Ezetimibe) .Marland Kitchen... Take one tablet by mouth daily.  Problem # 5:  CEREBROVASCULAR ACCIDENT, HX OF (ICD-V12.50) uses cain.  Patient Instructions: 1)  Your physician recommends that you schedule a follow-up appointment in: 4 MONTHS 2)  Your physician recommends that you continue on  your current medications as directed. Please refer to the Current Medication list given to you today. 3)  Your physician has requested that you regularly monitor and record your blood pressure readings at home.  Please use the same machine at the same time of day to check your readings and record them to bring to your follow-up visit.

## 2010-12-29 NOTE — Assessment & Plan Note (Addendum)
Summary: ROV   Visit Type:  Follow-up  CC:  follow up echo done today.  History of Present Illness: The patient comes in for followup.  She is actually here for follow up on her echo study.  This demonstrates some change, and she has continued to have some intermittent symptoms, mostly left sided chest pain which is hard to characterize.  She has had a fairly full work up, initiated by Dr. Dietrich Pates, with findings of unremarkable echo, and normal perfusion.  However, she now has on echo described by Dr. Eden Emms as a wall motion abnormality inovlving the distal septal area.  We discussed cardiac cath, and given the echo change suggest that it would be best to proceed with a definitive diagnosis.  She is agreeable, and we have discussed the risks and benefits with her.  It is also unclear as to her current medical regimen.  Current Medications (verified): 1)  Aspirin 81 Mg Tbec (Aspirin) .... Take One Tablet By Mouth Daily 2)  Bumetanide 1 Mg Tabs (Bumetanide) .... Take 1 Tablet By Mouth Once A Day 3)  Cardura 8 Mg Tabs (Doxazosin Mesylate) .... Take 1 Tab By Mouth At Bedtime 4)  Clonidine Hcl 0.3 Mg Tabs (Clonidine Hcl) .... Take One Tablet By Mouth Three Times A Day 5)  Enablex 7.5 Mg Xr24h-Tab (Darifenacin Hydrobromide) .... Take 1 Tablet By Mouth Once A Day 6)  Exforge 10-160 Mg Tabs (Amlodipine Besylate-Valsartan) .... Take 1 Tablet By Mouth Once A Day 7)  Furosemide 40 Mg Tabs (Furosemide) .... Take One Tablet By Mouth Daily. 8)  Lisinopril 10 Mg Tabs (Lisinopril) .... Take One Tablet By Mouth Daily 9)  Multivitamins  Tabs (Multiple Vitamin) .... Take 1 Tablet By Mouth Once A Day 10)  Omeprazole 20 Mg Tbec (Omeprazole) .... Take 1 Tablet By Mouth Two Times A Day 11)  Verapamil Hcl 80 Mg Tabs (Verapamil Hcl) .... Take 1 Tablet By Mouth Two Times A Day 12)  Vitamin B-12 100 Mcg Tabs (Cyanocobalamin) .... Take 1 Tablet By Mouth Once A Day 13)  Zetia 10 Mg Tabs (Ezetimibe) .... Take One Tablet  By Mouth Daily. 14)  Potassium Chloride Cr 10 Meq Cr-Caps (Potassium Chloride) .... Take One Tablet By Mouth Daily  Allergies (verified): No Known Drug Allergies  Past History:  Past Medical History: Last updated: Jan 05, 2010 Current Problems:  HYPERTENSION, UNSPECIFIED (ICD-401.9) HYPERLIPIDEMIA (ICD-272.4) CEREBROVASCULAR ACCIDENT, HX OF (ICD-V12.50) CHEST PAIN, HX OF (ICD-V12.50) OBESITY (ICD-278.00) PEPTIC ULCER DISEASE, HX OF (ICD-V12.71)  Past Surgical History: Last updated: 01/05/10 Tubal ligation Hysterectomy  Family History: Last updated: 01/05/10  Mother died of hypertension and peripheral vascular   disease.  Father died due to cirrhosis.  A grandmother died due to   myocardial infarction.      Social History: Last updated: 2010-01-05  Married and resides locally.  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  She does not use tobacco products nor  alcohol.  She uses no illicit drugs.      Review of Systems       The patient complains of chest pain, dyspnea on exertion, and severe indigestion/heartburn.  The patient denies anorexia, fever, weight loss, weight gain, vision loss, decreased hearing, hoarseness, peripheral edema, prolonged cough, headaches, hemoptysis, abdominal pain, melena, and hematochezia.    Vital Signs:  Patient profile:   60 year old female Height:      67 inches Weight:      192.25 pounds BMI:  30.22 Pulse rate:   58 / minute Pulse rhythm:   regular Resp:     18 per minute BP sitting:   94 / 63  (right arm) Cuff size:   large  Vitals Entered By: Vikki Ports (July 29, 2010 11:58 AM)  Physical Exam  General:  Well developed, well nourished, in no acute distress. Head:  normocephalic and atraumatic Eyes:  PERRLA/EOM intact; conjunctiva and lids normal. Lungs:  Clear bilaterally to auscultation and percussion. Heart:  Non-displaced PMI, chest non-tender; regular rate and rhythm, S1, S2 without murmurs,  rubs or gallops. Carotid upstroke normal, no bruit. Normal abdominal aortic size, no bruits. Abdomen:  Bowel sounds positive; abdomen soft and non-tender without masses, organomegaly, or hernias noted. No hepatosplenomegaly. Pulses:  pulses normal in all 4 extremities Extremities:  No clubbing or cyanosis. Neurologic:  Alert and oriented x 3.   Echocardiogram  Procedure date:  07/29/2010  Findings:      Left ventricle: Septal and apical hypokinesis The cavity size was       mildly dilated. Wall thickness was normal. Systolic function was       mildly reduced. The estimated ejection fraction was in the range       of 45% to 50%. Severe hypokinesis of the apical myocardium.       Doppler parameters are consistent with abnormal left ventricular       relaxation (grade 1 diastolic dysfunction).     - Aortic valve: Mild regurgitation.     - Mitral valve: Mild regurgitation.     - Left atrium: The atrium was mildly dilated.     - Tricuspid valve: Mild-moderate regurgitation.         Nuclear Study  Procedure date:  12/29/2009  Findings:      Stress Procedure   The patient received IV Lexiscan 0.4 mg over 15-seconds.  Myoview injected at 30-seconds.  There were no significant changes with infusion.  Quantitative spect images were obtained after a 45 minute delay.  QPS  Raw Data Images:  Normal; no motion artifact; normal heart/lung ratio. Stress Images:  There is normal uptake in all areas. Rest Images:  Normal homogeneous uptake in all areas of the myocardium. Subtraction (SDS):  No evidence of ischemia. Transient Ischemic Dilatation:  1.20  (Normal <1.22)  Lung/Heart Ratio:  .24  (Normal <0.45)  Quantitative Gated Spect Images  QGS EDV:  119 ml QGS ESV:  55 ml QGS EF:  54 % QGS cine images:  Normal  Findings  Normal nuclear study      Overall Impression   Exercise Capacity: Lexiscan myoview BP Response: Normal blood pressure response. Clinical Symptoms:  headache ECG Impression: No significant ST segment change suggestive of ischemia. Overall Impression: Normal stress nuclear study.    Signed by Talitha Givens, MD, [facility] on 12/29/2009 at 4:04 PM   EKG  Procedure date:  07/29/2010  Findings:      SB with first degree av block.  Slightly prominent voltage.  No ST or T changes noted.    Impression & Recommendations:  Problem # 1:  AORTIC STENOSIS/ INSUFFICIENCY, NON-RHEUMATIC (ICD-424.1) Mild at present.  On 2 different diuretics, but she does not know her meds.  Will inquire at home.  The AI is mild Her updated medication list for this problem includes:    Bumetanide 1 Mg Tabs (Bumetanide) .Marland Kitchen... Take 1 tablet by mouth once a day    Exforge 10-160 Mg Tabs (Amlodipine besylate-valsartan) .Marland Kitchen... Take 1 tablet  by mouth once a day    Furosemide 40 Mg Tabs (Furosemide) .Marland Kitchen... Take one tablet by mouth daily.    Lisinopril 10 Mg Tabs (Lisinopril) .Marland Kitchen... Take one tablet by mouth daily  Orders: Cardiac Catheterization (Cardiac Cath)  Problem # 2:  HYPOKALEMIA (ICD-276.8) Will get labs for cath study.  Problem # 3:  CHEST PAIN-PRECORDIAL (ICD-786.51) Many atypical features.  However, echo suggests new wall motion abnormality according to [employer] report.  Based on this, would favor cath to clarify if patient does or does not have CAD.  Will also do root study to assess degree of AI.  Discussed with patient who understands and consents to further evaluation.   Her updated medication list for this problem includes:    Aspirin 81 Mg Tbec (Aspirin) .Marland Kitchen... Take one tablet by mouth daily    Exforge 10-160 Mg Tabs (Amlodipine besylate-valsartan) .Marland Kitchen... Take 1 tablet by mouth once a day    Lisinopril 10 Mg Tabs (Lisinopril) .Marland Kitchen... Take one tablet by mouth daily    Verapamil Hcl 80 Mg Tabs (Verapamil hcl) .Marland Kitchen... Take 1 tablet by mouth two times a day  Patient Instructions: 1)  Your physician recommends that you schedule a follow-up appointment in: 3  weeks 2)  Your physician recommends that you continue on your current medications as directed. Please refer to the Current Medication list given to you today. 3)  Your physician has requested that you have a cardiac catheterization.  Cardiac catheterization is used to diagnose and/or treat various heart conditions. Doctors may recommend this procedure for a number of different reasons. The most common reason is to evaluate chest pain. Chest pain can be a symptom of coronary artery disease (CAD), and cardiac catheterization can show whether plaque is narrowing or blocking your heart's arteries. This procedure is also used to evaluate the valves, as well as measure the blood flow and oxygen levels in different parts of your heart.  For further information please visit [email or web address]  Please follow instruction sheet, as given. 4)  Scheduled for August 07, 2010.

## 2011-01-25 ENCOUNTER — Ambulatory Visit: Payer: Self-pay | Admitting: Cardiology

## 2011-01-27 ENCOUNTER — Ambulatory Visit: Payer: Self-pay | Admitting: Cardiology

## 2011-02-11 LAB — POCT I-STAT 3, ART BLOOD GAS (G3+)
Acid-base deficit: 2 mmol/L (ref 0.0–2.0)
Bicarbonate: 23.2 mEq/L (ref 20.0–24.0)
O2 Saturation: 92 %
TCO2: 24 mmol/L (ref 0–100)
pCO2 arterial: 41.7 mmHg (ref 35.0–45.0)
pH, Arterial: 7.353 (ref 7.350–7.400)
pO2, Arterial: 66 mmHg — ABNORMAL LOW (ref 80.0–100.0)

## 2011-02-11 LAB — POCT I-STAT 3, VENOUS BLOOD GAS (G3P V)
Acid-base deficit: 1 mmol/L (ref 0.0–2.0)
Acid-base deficit: 1 mmol/L (ref 0.0–2.0)
Acid-base deficit: 2 mmol/L (ref 0.0–2.0)
Bicarbonate: 24.4 mEq/L — ABNORMAL HIGH (ref 20.0–24.0)
Bicarbonate: 25.2 mEq/L — ABNORMAL HIGH (ref 20.0–24.0)
Bicarbonate: 25.4 mEq/L — ABNORMAL HIGH (ref 20.0–24.0)
O2 Saturation: 57 %
O2 Saturation: 64 %
O2 Saturation: 64 %
TCO2: 26 mmol/L (ref 0–100)
TCO2: 27 mmol/L (ref 0–100)
TCO2: 27 mmol/L (ref 0–100)
pCO2, Ven: 45.9 mmHg (ref 45.0–50.0)
pCO2, Ven: 45.9 mmHg (ref 45.0–50.0)
pCO2, Ven: 46.4 mmHg (ref 45.0–50.0)
pH, Ven: 7.334 — ABNORMAL HIGH (ref 7.250–7.300)
pH, Ven: 7.346 — ABNORMAL HIGH (ref 7.250–7.300)
pH, Ven: 7.349 — ABNORMAL HIGH (ref 7.250–7.300)
pO2, Ven: 32 mmHg (ref 30.0–45.0)
pO2, Ven: 35 mmHg (ref 30.0–45.0)
pO2, Ven: 35 mmHg (ref 30.0–45.0)

## 2011-02-11 LAB — D-DIMER, QUANTITATIVE: D-Dimer, Quant: 0.4 ug/mL-FEU (ref 0.00–0.48)

## 2011-02-14 LAB — BASIC METABOLIC PANEL
BUN: 9 mg/dL (ref 6–23)
BUN: 9 mg/dL (ref 6–23)
CO2: 28 mEq/L (ref 19–32)
CO2: 29 mEq/L (ref 19–32)
Calcium: 8.6 mg/dL (ref 8.4–10.5)
Calcium: 9.1 mg/dL (ref 8.4–10.5)
Chloride: 107 mEq/L (ref 96–112)
Chloride: 110 mEq/L (ref 96–112)
Creatinine, Ser: 0.97 mg/dL (ref 0.4–1.2)
Creatinine, Ser: 1.01 mg/dL (ref 0.4–1.2)
GFR calc Af Amer: >60 mL/min (ref >60)
GFR calc Af Amer: >60 mL/min (ref >60)
GFR calc non Af Amer: 56 mL/min — ABNORMAL LOW (ref >60)
GFR calc non Af Amer: 59 mL/min — ABNORMAL LOW (ref >60)
Glucose, Bld: 77 mg/dL (ref 70–99)
Glucose, Bld: 88 mg/dL (ref 70–99)
Potassium: 3.1 mEq/L — ABNORMAL LOW (ref 3.5–5.1)
Potassium: 4.1 mEq/L (ref 3.5–5.1)
Sodium: 144 mEq/L (ref 135–145)
Sodium: 145 mEq/L (ref 135–145)

## 2011-02-14 LAB — COMPREHENSIVE METABOLIC PANEL
ALT: 15 U/L (ref 0–35)
AST: 16 U/L (ref 0–37)
Albumin: 3.4 g/dL — ABNORMAL LOW (ref 3.5–5.2)
Alkaline Phosphatase: 74 U/L (ref 39–117)
BUN: 14 mg/dL (ref 6–23)
CO2: 27 mEq/L (ref 19–32)
Calcium: 8.7 mg/dL (ref 8.4–10.5)
Chloride: 108 mEq/L (ref 96–112)
Creatinine, Ser: 1.03 mg/dL (ref 0.4–1.2)
GFR calc Af Amer: >60 mL/min (ref >60)
GFR calc non Af Amer: 55 mL/min — ABNORMAL LOW (ref >60)
Glucose, Bld: 89 mg/dL (ref 70–99)
Potassium: 3.1 mEq/L — ABNORMAL LOW (ref 3.5–5.1)
Sodium: 142 mEq/L (ref 135–145)
Total Bilirubin: 0.6 mg/dL (ref 0.3–1.2)
Total Protein: 7 g/dL (ref 6.0–8.3)

## 2011-02-14 LAB — CARDIAC PANEL(CRET KIN+CKTOT+MB+TROPI)
CK, MB: 1.2 ng/mL (ref 0.3–4.0)
Relative Index: INVALID (ref 0.0–2.5)
Total CK: 70 U/L (ref 7–177)
Troponin I: 0.02 ng/mL (ref 0.00–0.06)

## 2011-02-14 LAB — URINALYSIS, ROUTINE W REFLEX MICROSCOPIC
Bilirubin Urine: NEGATIVE
Glucose, UA: NEGATIVE mg/dL
Hgb urine dipstick: NEGATIVE
Ketones, ur: NEGATIVE mg/dL
Nitrite: NEGATIVE
Protein, ur: NEGATIVE mg/dL
Specific Gravity, Urine: 1.034 — ABNORMAL HIGH (ref 1.005–1.030)
Urobilinogen, UA: 0.2 mg/dL (ref 0.0–1.0)
pH: 5.5 (ref 5.0–8.0)

## 2011-02-14 LAB — CBC
HCT: 33.8 % — ABNORMAL LOW (ref 36.0–46.0)
HCT: 34.7 % — ABNORMAL LOW (ref 36.0–46.0)
HCT: 35.7 % — ABNORMAL LOW (ref 36.0–46.0)
Hemoglobin: 11.3 g/dL — ABNORMAL LOW (ref 12.0–15.0)
Hemoglobin: 11.6 g/dL — ABNORMAL LOW (ref 12.0–15.0)
Hemoglobin: 12.2 g/dL (ref 12.0–15.0)
MCHC: 33.5 g/dL (ref 30.0–36.0)
MCHC: 33.6 g/dL (ref 30.0–36.0)
MCHC: 34.1 g/dL (ref 30.0–36.0)
MCV: 88.2 fL (ref 78.0–100.0)
MCV: 88.7 fL (ref 78.0–100.0)
MCV: 89.5 fL (ref 78.0–100.0)
Platelets: 267 10*3/uL (ref 150–400)
Platelets: 268 10*3/uL (ref 150–400)
Platelets: 272 10*3/uL (ref 150–400)
RBC: 3.81 MIL/uL — ABNORMAL LOW (ref 3.87–5.11)
RBC: 3.88 MIL/uL (ref 3.87–5.11)
RBC: 4.04 MIL/uL (ref 3.87–5.11)
RDW: 15.6 % — ABNORMAL HIGH (ref 11.5–15.5)
RDW: 15.8 % — ABNORMAL HIGH (ref 11.5–15.5)
RDW: 15.9 % — ABNORMAL HIGH (ref 11.5–15.5)
WBC: 4.4 10*3/uL (ref 4.0–10.5)
WBC: 4.5 10*3/uL (ref 4.0–10.5)
WBC: 5.6 10*3/uL (ref 4.0–10.5)

## 2011-02-14 LAB — CK TOTAL AND CKMB (NOT AT ARMC)
CK, MB: 1.2 ng/mL (ref 0.3–4.0)
CK, MB: 1.5 ng/mL (ref 0.3–4.0)
Relative Index: INVALID (ref 0.0–2.5)
Relative Index: INVALID (ref 0.0–2.5)
Total CK: 79 U/L (ref 7–177)
Total CK: 79 U/L (ref 7–177)

## 2011-02-14 LAB — D-DIMER, QUANTITATIVE (NOT AT ARMC): D-Dimer, Quant: 0.31 ug/mL-FEU (ref 0.00–0.48)

## 2011-02-14 LAB — LIPID PANEL
Cholesterol: 163 mg/dL (ref 0–200)
HDL: 48 mg/dL (ref >39)
LDL Cholesterol: 91 mg/dL (ref 0–99)
Total CHOL/HDL Ratio: 3.4 RATIO
Triglycerides: 118 mg/dL (ref <150)
VLDL: 24 mg/dL (ref 0–40)

## 2011-02-14 LAB — POCT CARDIAC MARKERS
CKMB, poc: <1 ng/mL — ABNORMAL LOW (ref 1.0–8.0)
Myoglobin, poc: 70.5 ng/mL (ref 12–200)
Troponin i, poc: <0.05 ng/mL (ref 0.00–0.09)

## 2011-02-14 LAB — DIFFERENTIAL
Basophils Absolute: 0 10*3/uL (ref 0.0–0.1)
Basophils Relative: 0 % (ref 0–1)
Eosinophils Absolute: 0.2 10*3/uL (ref 0.0–0.7)
Eosinophils Relative: 5 % (ref 0–5)
Lymphocytes Relative: 26 % (ref 12–46)
Lymphs Abs: 1.1 10*3/uL (ref 0.7–4.0)
Monocytes Absolute: 0.3 10*3/uL (ref 0.1–1.0)
Monocytes Relative: 7 % (ref 3–12)
Neutro Abs: 2.7 10*3/uL (ref 1.7–7.7)
Neutrophils Relative %: 61 % (ref 43–77)

## 2011-02-14 LAB — LIPASE, BLOOD: Lipase: 27 U/L (ref 11–59)

## 2011-02-14 LAB — TSH: TSH: 1.42 u[IU]/mL (ref 0.350–4.500)

## 2011-02-14 LAB — TROPONIN I
Troponin I: 0.01 ng/mL (ref 0.00–0.06)
Troponin I: 0.04 ng/mL (ref 0.00–0.06)

## 2011-02-24 ENCOUNTER — Encounter: Payer: Self-pay | Admitting: Cardiology

## 2011-02-25 ENCOUNTER — Ambulatory Visit: Payer: Self-pay | Admitting: Cardiology

## 2011-02-25 ENCOUNTER — Ambulatory Visit (INDEPENDENT_AMBULATORY_CARE_PROVIDER_SITE_OTHER): Payer: Medicaid Other | Admitting: Cardiology

## 2011-02-25 ENCOUNTER — Encounter: Payer: Self-pay | Admitting: Cardiology

## 2011-02-25 DIAGNOSIS — R072 Precordial pain: Secondary | ICD-10-CM

## 2011-02-25 DIAGNOSIS — I351 Nonrheumatic aortic (valve) insufficiency: Secondary | ICD-10-CM

## 2011-02-25 DIAGNOSIS — I10 Essential (primary) hypertension: Secondary | ICD-10-CM

## 2011-02-25 DIAGNOSIS — E785 Hyperlipidemia, unspecified: Secondary | ICD-10-CM

## 2011-02-25 DIAGNOSIS — I359 Nonrheumatic aortic valve disorder, unspecified: Secondary | ICD-10-CM

## 2011-02-25 NOTE — Assessment & Plan Note (Signed)
Dr. Parke Simmers has given her direction on this.

## 2011-02-25 NOTE — Patient Instructions (Addendum)
Your physician wants you to follow-up in: 4 MONTHS.  You will receive a reminder letter in the mail two months in advance. If you don't receive a letter, please call our office to schedule the follow-up appointment.  Your physician has requested that you have an echocardiogram in the next few weeks. Echocardiography is a painless test that uses sound waves to create images of your heart. It provides your doctor with information about the size and shape of your heart and how well your heart's chambers and valves are working. This procedure takes approximately one hour. There are no restrictions for this procedure.  Your physician has recommended you make the following change in your medication: STOP Bumex and Potassium

## 2011-02-25 NOTE — Assessment & Plan Note (Signed)
BP well controlled.  Probably does not need diuretic, and with elevated BP suggest that she should stop that and K for now.  Follow up with Dr. Parke Simmers suggested.

## 2011-02-25 NOTE — Assessment & Plan Note (Signed)
Not a sig murmur.  EF was down last time, and for unclear reasons.  No clinical CHF.  Will repeat.

## 2011-02-25 NOTE — Progress Notes (Signed)
HPI:  Patient is doing well. She was placed on celebrex for her chest pain, and it has helped a lot.  Her BUN was elevated, and she does not have fluid unless she eats salt in general.  We discussed this in some detail.  Her last EF was down a bit, and repeat echo has been suggested to follow this.  No orthopnea or dyspnea.   Current Outpatient Prescriptions  Medication Sig Dispense Refill  . amLODipine-valsartan (EXFORGE) 10-160 MG per tablet Take 1 tablet by mouth daily.        Marland Kitchen aspirin 81 MG tablet Take 81 mg by mouth daily.        . bumetanide (BUMEX) 1 MG tablet Take 1 mg by mouth daily.        . celecoxib (CELEBREX) 200 MG capsule Take 200 mg by mouth daily.        . cloNIDine (CATAPRES) 0.3 MG tablet Take 0.3 mg by mouth 2 (two) times daily.       . cyanocobalamin 100 MCG tablet Take 100 mcg by mouth daily.       Marland Kitchen darifenacin (ENABLEX) 7.5 MG 24 hr tablet Take 7.5 mg by mouth daily.        Marland Kitchen doxazosin (CARDURA) 8 MG tablet Take 8 mg by mouth at bedtime.        Marland Kitchen ezetimibe (ZETIA) 10 MG tablet Take 10 mg by mouth daily.        Marland Kitchen lisinopril (PRINIVIL,ZESTRIL) 10 MG tablet Take 10 mg by mouth daily.        . meloxicam (MOBIC) 15 MG tablet Take 15 mg by mouth daily.        . Multiple Vitamin (MULTIVITAMIN) tablet Take 1 tablet by mouth daily.        . niacin 500 MG tablet Take 500 mg by mouth daily with breakfast.        . omeprazole (PRILOSEC) 20 MG capsule Take 20 mg by mouth 2 (two) times daily.        . potassium chloride (KLOR-CON) 10 MEQ CR tablet Take 10 mEq by mouth daily.        . verapamil (CALAN) 80 MG tablet Take 80 mg by mouth 2 (two) times daily.          No Known Allergies  Past Medical History  Diagnosis Date  . Hypertension   . Hyperlipidemia   . Cerebrovascular accident     hx of  . Chest pain   . Obesity   . Peptic ulcer disease   . Hx of hysterectomy     Past Surgical History  Procedure Date  . Tubal ligation     Family History  Problem Relation Age  of Onset  . Hypertension Mother     deceased  . Peripheral vascular disease Mother   . Cirrhosis Father     DICEASED  . Heart attack Other     grandmother deceased    History   Social History  . Marital Status: Married    Spouse Name: N/A    Number of Children: N/A  . 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 Smoking status: Never Smoker   . Smokeless tobacco: Not on file  . Alcohol Use: No  . Drug Use: No  . Sexually Active: Not on file   Other Topics  Concern  . Not on file   Social History Narrative  . No narrative on file    ROS: Please see the HPI.  All other systems reviewed and negative.  PHYSICAL EXAM:  BP 115/70  Pulse 79  Ht 5\' 6"  (1.676 m)  Wt 189 lb 12.8 oz (86.093 kg)  BMI 30.63 kg/m2  General: Well developed, well nourished, in no acute distress. Head:  Normocephalic and atraumatic. Neck: no JVD Lungs: Clear to auscultation and percussion. Heart: Normal S1 and S2.  Minimal AI murmur.   Pulses: Pulses normal in all 4 extremities. Extremities: No clubbing or cyanosis. No edema. Neurologic: Alert and oriented x 3.  EKG:  NSR. LAE.  LVH.  Otherwise, no acute changes.   ASSESSMENT AND PLAN:

## 2011-02-25 NOTE — Assessment & Plan Note (Signed)
It is better on celebrex.    She wants to continue.

## 2011-03-01 ENCOUNTER — Encounter: Payer: Self-pay | Admitting: Cardiology

## 2011-03-15 ENCOUNTER — Ambulatory Visit (HOSPITAL_COMMUNITY): Payer: Medicare Other | Attending: Cardiology

## 2011-03-15 ENCOUNTER — Telehealth: Payer: Self-pay | Admitting: Cardiology

## 2011-03-15 DIAGNOSIS — R072 Precordial pain: Secondary | ICD-10-CM | POA: Insufficient documentation

## 2011-03-15 DIAGNOSIS — I359 Nonrheumatic aortic valve disorder, unspecified: Secondary | ICD-10-CM | POA: Insufficient documentation

## 2011-04-16 NOTE — Discharge Summary (Signed)
Chelsea [name], Chelsea [name]                  ACCOUNT NO.:  [id number]   MEDICAL RECORD NO.:  [id number]          PATIENT TYPE:  INP   LOCATION:  4705                         FACILITY:  [facility]   PHYSICIAN:  Altha Harm, MDDATE OF BIRTH:  08-19-1951   DATE OF ADMISSION:  02/28/2007  DATE OF DISCHARGE:  03/02/2007                               DISCHARGE SUMMARY   DISCHARGE DISPOSTION:  Home.   DISCHARGE DIAGNOSES:  1. Hypertensive urgency with malignant hypertension.  2. Unstable angina.  3. Hypokalemia.  4. Status post cerebral vascular accident in the past.  5. Chronic pain.  6. History of hyperlipidemia.  7. History of anxiety and depression.  8. History of stage III hypertension.  9. Questionable cirrhosis of the liver.   DISCHARGE MEDICATIONS:  1. Aspirin, enteric-coated, 81 mg p.o. daily.  2. Clonidine 0.2 mg p.o. every 8 hours.  3. Calan SR 160 mg p.o. daily.  4. Lisinopril 20 mg p.o. daily.  5. K-Dur 40 mg p.o. daily.  6. Zetia 10 mg p.o. daily.  7. Omeprazole 40 mg p.o. daily.  8. Potassium chloride 40 mEq b.i.d.  9. Darvocet-N 100 one tab p.o. every 4 hours p.r.n.   MEDICATIONS THAT HAVE BEEN CHANGED:  1. Clonidine changed from 0.3 mg to 0.2 mg every 8 hours.  2. Lisinopril changed from 10 mg daily to 20 mg daily.  3. (Please note the patient did not know her dose of Calan SR, so this      dose may be a new dosing from what the patient was on before).   CONSULTANTS:  Dr. Riley Kill, cardiology.   PROCEDURES:  None.   DIAGNOSTIC STUDIES:  1. 2D echocardiogram which shows mild left ventricular hypertrophy.      No left ventricular regional wall abnormalities.  Ejection fraction      ranging between 55-60%, and Doppler parameter is consistent with      high left ventricular filling pressures.  The patient also has mild      aortic valve regurgitation and mild nodular valvular regurgitation.  2. Chest x-ray portable which shows stable cardiomegaly and no active  lung disease.   PRIMARY CARE PHYSICIAN:  Dr. Lowella Bandy.   CODE STATUS:  Full Code.   ALLERGIES:  NO KNOWN DRUG ALLERGIES.   CHIEF COMPLAINT:  Chest pain.   HISTORY OF PRESENT ILLNESS:  Please see H&P dictated by Dr. Lavera Guise for the  details of the HPI.   HOSPITAL COURSE:  1. Chest pain:  The patient was ruled out for resting ischemia with      serial enzymes.  The chest pain was felt to be due to her stage III      hypertension, which on admission was 158/100.  The patient was      given sublingual nitro in the emergency room and then started on a      nitro drip.  [facility] was consulted, and they evaluated      the patient and felt that even though the patient warranted a      workup for ischemic heart disease,  they would not do this until the      patient had been worked up for her hypokalemia.  I would recommend      that the patient follow up with [facility] as an outpatient      for further workup of possible ischemic heart disease.  Please note      that the patient ambulated while hospitalized without any      recurrence of her chest pain, and her vital signs remained stable.  2. Hypertension stage III with hypertensive urgency:  The patient came      in with blood pressure as noted above (158/100).  The patient was      initially managed with IV nitro and some of her usual medications.      Please note that on admission, the patient did not reveal to the      admitting physician that she had been on Calan and her K-Dur, both      of which she had discontinued because she had ran out of her      prescription.  This more than likely was therefore inciting event      for her hypertensive urgency.  The patient was managed with several      medications.  The medications adjusted as noted above.  The      patient's blood pressure is now running within the range of 151 to      153/80 to 94, and the patient will probably need further workup and      titration as an  outpatient.  3. Hypokalemia:  The patient came with a potassium low at 2.4.  The      patient received oral repletion of her potassium, as the potassium      dosing is being increased from 40 mEq a day to 80 mEq per day.      Given the patient's high blood pressure, her low potassium without      the use of diuretics and her relatively high sodium, it was felt by      consensus of all the physicians involved that this patient would      warrant a workup for hyperaldosteronism.  However, after discussing      the with the patient, the patient wants to follow up with her      primary care physician, who is also a certified hypertension      specialist and to have a workup done either by her primary care      physician or have a referral made from her primary care physician      to either a nephrologist or endocrinologist.  The patient did not      under any circumstances want to be seen by a consultant for this      problem, although hospitalized. The patient should have her      potassium checked within the week to ensure that it is staying in      the therapeutic range and also to have workup for hypokalemia      initiated.  The patient has shown no signs of renal compromise with      the creatinine clearance being 88.10 mL/minute.  All other medical      problems remained stable.  The patient is being continued on      medication as noted above.   DIETARY RESTRICTIONS:  The patient should be on a low-cholesterol, low-  sodium diet.   PHYSICAL RESTRICTIONS:  None.   FOLLOWUP:  The patient should follow up with her primary care physician,  Dr. Renaye Rakers, within 1 week and is to follow up with Dr. Riley Kill in  the office as determined by her primary care physician.   LABORATORY FOLLOWUP:  The patient should have her electrolytes evaluated  in approximately 1 week in light of her hypokalemia and also in light of  the increase in her lisinopril.      Altha Harm,  MD Electronically Signed     MAM/MEDQ  D:  03/02/2007  T:  03/02/2007  Job:  [id number]   cc:   Arturo Morton. Riley Kill, MD, [facility]  Renaye Rakers, M.D.

## 2011-04-16 NOTE — Op Note (Signed)
NAME:  Chelsea [name], Chelsea [name] NO.:  [id number]   MEDICAL RECORD NO.:  [id number]                   PATIENT TYPE:   LOCATION:                                       FACILITY:   PHYSICIAN:  Anselmo Rod, M.D.               DATE OF BIRTH:  07/23/1951   DATE OF PROCEDURE:  10/23/2002  DATE OF DISCHARGE:                                 OPERATIVE REPORT   PROCEDURE PERFORMED:  Esophagogastroduodenoscopy.   ENDOSCOPIST:  Charna Elizabeth, M.D.   INSTRUMENT USED:  Olympus video panendoscope.   INDICATIONS FOR PROCEDURE:  The patient is a 60 year old African-American  female with history of an antral ulcer.  Repeat esophagogastroduodenoscopy  is being done to rule out nonhealing ulcer disease.   PREPROCEDURE PREPARATION:  Informed consent was procured from the patient.  The patient was fasted for eight hours prior to the procedure.   PREPROCEDURE PHYSICAL:  The patient had stable vital signs.  Neck supple,  chest clear to auscultation.  S1, S2 regular.  Abdomen soft with normal  bowel sounds.   DESCRIPTION OF PROCEDURE:  The patient was placed in the left lateral  decubitus position and sedated with 50 mg of Demerol and 5 mg of Versed  intravenously.  Once the patient was adequately sedated and maintained on  low-flow oxygen and continuous cardiac monitoring, the Olympus video  panendoscope was advanced through the mouth piece over the tongue into the  esophagus under direct vision.  The entire esophagus appeared normal with no  evidence of ring, stricture, masses, esophagitis or Barrett's mucosa.  The  scope was then advanced to the stomach.  There was moderate diffuse  gastritis throughout the gastric mucosa.  A small  hiatal hernia was seen on  high retroflexion.  No ulcers were noted.  There was no evidence of visible  vessels and duodenal bulb and proximal small bowel appeared normal.  There  was no evidence of outlet obstruction.   IMPRESSION:  1.  Moderate diffuse gastritis.  2. Small  hiatal hernia.  3. Normal-appearing esophagus and proximal small bowel.    RECOMMENDATIONS:  1. Continue proton pump inhibitor.  2. Schedule colonoscopy at a later date as the patient did not take her prep     today as previously recommended.  3. Outpatient follow-up in the next two weeks, earlier if need be.                                                Anselmo Rod, M.D.    JNM/MEDQ  D:  10/23/2002  T:  10/23/2002  Job:  [id number]   cc:   Candyce Churn. Allyne Gee, M.D.  [phone] [address] [place]  [place] [address]  Fax: [phone]

## 2011-04-16 NOTE — Op Note (Signed)
Chelsea [name], HEFFERNAN                           ACCOUNT NO.:  [id number]   MEDICAL RECORD NO.:  [id number]                   PATIENT TYPE:  AMB   LOCATION:  ENDO                                 FACILITY:  [facility]   PHYSICIAN:  Charna Elizabeth, M.D.                   DATE OF BIRTH:  [birth year]   DATE OF PROCEDURE:  07/06/2002  DATE OF DISCHARGE:  07/06/2002                                 OPERATIVE REPORT   PROCEDURE PERFORMED:  Esophagogastroduodenoscopy with biopsies.   ENDOSCOPIST:  Charna Elizabeth, M.D.   INSTRUMENT USED:  Olympus video panendoscope.   INDICATIONS FOR PROCEDURE:  History of bleeding ulcer diagnosed in the past.  The patient has been treated with Prevpac and PPIs, rule out nonhealing  ulcer.   PREPROCEDURE PREPARATION:  Informed consent was procured from the patient.  The patient was fasted for eight hours prior to the procedure.   PREPROCEDURE PHYSICAL:  VITAL SIGNS:  The patient had stable vital signs.  NECK:  Supple.  CHEST:  Clear to auscultation.  CARDIAC:  S1 and S2 regular.  ABDOMEN:  Soft with normal bowel sounds.   DESCRIPTION OF PROCEDURE:  The patient was placed in the left lateral  decubitus position.  Sedated with 55 mg of Demerol and 5 mg of Versed  intravenously.  Once the patient was adequately sedated, maintained on low-  flow oxygen, and continuous cardiac monitoring, the Olympus panendoscope was  advanced with the mouthpiece over the tongue and into the esophagus.  Under  direct vision, there was evidence of mild distal esophagus.  On advancing  the scope into the stomach, a small hiatal hernia was seen on high  retroflexion. There were multiple superficial anterior ulcers seen with  erosions and severe antral gastritis.  Biopsies were done to rule out the  presence of Helicobacter pylori by pathology.  The rest of the gastric  mucosa in the proximal half of the stomach appeared normal.  The proximal  stomach including the duodenal bulb appeared  normal up 60 cm.  The patient  tolerated the procedure well without complications.   IMPRESSION:  1. Mild distal esophagitis.  2. Multiple superficial antral ulcers with evidence of old heme around the     ulcers.  3. Biopsies to rule out Helicobacter pylori pathology.  4. Normal proximal stomach.  5. Small hiatal hernia on high retroflexion.   RECOMMENDATIONS:  1. Avoid all nonsteroidal anti-inflammatories including BC and Goody's     Powders.  2.     PPI is to be resumed.  3. Will treat with antibiotics if H. Pylori present.  4. Outpatient followup within the next week.  Charna Elizabeth, M.D.    JM/MEDQ  D:  07/06/2002  T:  07/08/2002  Job:  [id number]   cc:   Candyce Churn. Allyne Gee, M.D.

## 2011-04-16 NOTE — H&P (Signed)
NAMEDENECIA, BRUNETTE NO.:  [id number]   MEDICAL RECORD NO.:  [id number]          PATIENT TYPE:  IPS   LOCATION:  4149                         FACILITY:  [facility]   PHYSICIAN:  Ellwood Dense, M.D.   DATE OF BIRTH:  [birth year]   DATE OF ADMISSION:  04/21/2006  DATE OF DISCHARGE:                                HISTORY & PHYSICAL   PRIMARY CARE PHYSICIAN:  Renaye Rakers, M.D.   HISTORY OF PRESENT ILLNESS:  Chelsea [name] is a 60 year old adult female with a  history of poorly controlled blood pressure.  She was admitted Apr 17, 2006,  with acute onset of left-sided weakness and numbness along with headache.  She has had these starting the day before admission.   Initial head CT showed a right thalamic hemorrhage with minimal edema and  minimal mass effect on the third ventricle with mid to left shift of 3 mm.  MRI and MRA study of the brain showed a right thalamic hematoma with mass  effect secondary to edema but no occlusion, stenosis or dissection.  Next,  follow-up cranial CT showed slight effacement of the third ventricle but no  hydrocephalus.   The patient was evaluated by speech therapy and showed no sign of dysphagia.  She was placed on a regular diet.  She continues with left hemiparesis along  with left hemisensory deficits, blurred vision and difficult with balance.   The patient was evaluated by the rehab physicians and felt to be an  appropriate candidate for inpatient rehabilitation.   ALLERGIES:  NO KNOWN DRUG ALLERGIES.   MEDICATIONS PRIOR TO ADMISSION:  1.  Clonidine 0.2 mg b.i.d.  2.  Cardura 8 mg nightly.  3.  Flexeril 10 mg t.i.d. p.r.n.  4.  Ibuprofen 800 mg t.i.d. p.r.n.  5.  K-Dur 20 mEq b.i.d.  6.  Norvasc daily.  7.  Lotrel daily.  8.  Verapamil 120 mg daily.  9.  Xanax 0.25 mg b.i.d. to t.i.d. p.r.n.  10. Lasix 40 mg daily p.r.n.  11. Celebrex 200 mg daily.  12. Zetia 10 mg daily.   REVIEW OF SYSTEMS:  Positive for bilateral lower  extremity weakness,  anxiety, lumbago and reflux.   PAST MEDICAL HISTORY:  1.  Uncontrolled hypertension.  2.  Hysterectomy.  3.  Osteoporosis.  4.  Anxiety.   FAMILY HISTORY:  Positive for cirrhosis and hypertension.   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  She does not use alcohol or tobacco.  Family is supportive.  Her husband  works third shift and can provide supervision during the day.  The two of  them live in a home with multiple pets.  Home is a one-level home with one  step to enter.   FUNCTIONAL HISTORY PRIOR TO ADMISSION:  Independent but not driving  secondary to personal preference.   LABORATORY DATA:  Recent labs showed hemoglobin of 14.4, hematocrit 43.6,  platelet count 328,000, white count 6.  Recent lipid panel showed  cholesterol of 203, triglycerides 103, HDL cholesterol 47, LDL cholesterol  135.  Recent sodium was 141, potassium 3.1, chloride 103, CO2 30, BUN 11,  creatinine 0.9, glucose 97.  Liver function panel was within normal limits.  Hemoglobin A1c was 5.4 and homocystine level was 12.8.   PHYSICAL EXAMINATION:  GENERAL APPEARANCE:  A well-appearing, fit, middle  aged adult female lying in bed in no acute discomfort.  VITAL SIGNS:  Blood pressure 131/82 with pulse 72, respiratory rate 20,  temperature 97.2 with O2 saturation 100%.  HEENT:  Normocephalic and atraumatic.  LUNGS:  Clear to auscultation bilaterally.  CARDIOVASCULAR:  Regular rate and rhythm.  S1 and S2 without murmurs.  ABDOMEN:  Soft, nontender with positive bowel sounds.  NEUROLOGIC:  Alert and oriented x3.  Cranial nerves II-XII showed decreased  sensation of the left face with tongue in the midline.  There was no  significant facial droop on the left side.   Right upper and right lower extremity strength was 5/5.  Bulk and tone were  normal  Reflexes were 2+ and symmetrical. Sensation was intact to light  touch throughout the right side upper  and lower extremity.   Left upper extremity strength showed 3+/5 strength with decreased  coordination and decreased sensory throughout the entire left upper  extremity.  She had significant apraxia and difficult with finger-to-nose  testing.  Left lower extremity strength showed 3+ to 4-/5 strength with  normal bulk and tone.  Sensation was decreased to light touch throughout the  left lower extremity.   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   Presently the patient has deficits at activities of daily living, transfers  and ambulation along with vision and self-care.   PLAN:  1.  Admit to the rehabilitation unit for daily OT, PT and speech therapy to      advance ADLs, transfers, ambulation and speech cognition as appropriate.  2.  24-hour nursing for medication administration and monitoring of vitals.  3.  Check admission labs including CBC and CMET Friday, Apr 22, 2006.  4.  Monitor hypertension on Cardura 8 mg nightly, Lasix 40 mg daily.  5.  Catapres 0.2 mg p.o. b.i.d., Verapamil sustained release 240 mg daily.  6.  Continue Protonix 40 mg daily for GI prophylaxis.  7.  Continue Zocor 20 mg nightly along with Zetia 10 mg daily for      dyslipidemia.  8.  Multivitamin daily.  9.  Ground pass with staff p.r.n.  10. Xanax 0.25 mg p.o. b.i.d. p.r.n. for anxiety.  11. Tylenol 325 mg one to two tablets p.o. q.4h. p.r.n. for pain.   PROGNOSIS:  Good.   ESTIMATED LENGTH OF STAY:  10 to 15 days.   GOALS:  Minimal assist to modified independent ADLs, transfers and  ambulation.           ______________________________  Ellwood Dense, M.D.     DC/MEDQ  D:  04/21/2006  T:  04/21/2006  Job:  [id number]

## 2011-04-16 NOTE — Discharge Summary (Signed)
[facility]  Patient:    Chelsea [name], Chelsea [name] Visit Number: [id number] MRN: [id number]          Service Type: MED Location: 3W [id number] 02 Attending Physician:  Charna Elizabeth Dictated by:   Anselmo Rod, M.D. Admit Date:  05/10/2002 Discharge Date: 05/12/2002                             Discharge Summary  DATE OF BIRTH:  01/29/1951  ADMISSION DIAGNOSES: 1. History of upper gastrointestinal bleed with melena. 2. Hypertension. 3. History of near syncope.  DISCHARGE DIAGNOSES: 1. Antral ulcer. 2. Small ulcer in duodenal bulb. 3. Iron-deficiency anemia. 4. Hypertension.  HOSPITAL COURSE:  Chelsea [name] is a 60 year old African-American female who was admitted through the [facility] Emergency Room, picked up by me on an unassigned service when she presented with a history of black stools. On 05/11/02, the patient had a tarry black bowel movement proceeded by two such bowel movements the night prior to admission.  She felt dizzy and diaphoretic, but denies passing out.  She has been feeling uneasy with some periumbilical cramping that started about a week ago.  There was no history of fever, nausea, vomiting, diarrhea, constipation.  She denies frank hematochezia. There is no history of abnormal weight loss or weight gain.  No cardiorespiratory complaints.  She had been taking two to three packs of Goody Powders per day for headaches.  ALLERGIES:  No known drug allergies.  MEDICATIONS:  Tenormin.  PAST MEDICAL HISTORY: 1. Three normal vaginal deliveries. 2. Hysterectomy. 3. In 2000, she had a tubal ligation prior to her hysterectomy after her    deliveries.  FAMILY HISTORY:  Her father and mother both died of myocardial infarctions in their 73s and 52s, respectively.  There is no known family history of colon cancer.  HOSPITAL COURSE:  The patient was started on Protonix IV, and an esophagogastroduodenoscopy was done that revealed an antral  ulcer with blood oozing around the margins.  No visible vessel was seen.  Biopsies were done for H. pylori.  The patient was then switched to p.o. Protonix, and discharged on 05/12/02, as her son was to be married that evening.  DISCHARGE MEDICATIONS:  Protonix 40 mg b.i.d.  DISCHARGE INSTRUCTIONS:  Avoid all nonsteroidals, including aspirin and Goody Powders.  FOLLOWUP:  Outpatient followup in the next two to three days for further recommendations.  Call the doctor or return to the hospital if there is any evidence of ongoing bleeding, dizziness, or passing out spells. Dictated by:   Anselmo Rod, M.D. Attending Physician:  Charna Elizabeth DD:  05/20/02 TD:  05/21/02 Job: [id number]

## 2011-04-16 NOTE — Discharge Summary (Signed)
[name], [name]                  ACCOUNT NO.:  [id number]   MEDICAL RECORD NO.:  [id number]          PATIENT TYPE:  IPS   LOCATION:  4149                         FACILITY:  [facility]   PHYSICIAN:  Erick Colace, M.D.DATE OF BIRTH:  05-03-1951   DATE OF ADMISSION:  04/21/2006  DATE OF DISCHARGE:  05/05/2006                                 DISCHARGE SUMMARY   DISCHARGE DIAGNOSES:  1.  Right thalamic intracerebral hemorrhage.  2.  Hypertension.  3.  Somnolence, improving with Ritalin.  4.  Situation depression.  5.  Dyslipidemia.   HISTORY OF PRESENT ILLNESS:  [name] is a 60 year old female with history  of poorly controlled hypertension, noncompliance with BP medications.  Admitted Apr 21, 2006 with left-sided weakness, numbness and headaches since  the day before.  A CT of the head showed right thalamic hemorrhage, with  mild edema and minimal mass effect on third ventricle and a mid-to-left  sided shift of 3 mm.  MRI/MRA of the brain showed right thalamic hematoma  with mass effect, with neck edema.  No occlusion stenosis or dissection.   Follow-up CT last showed slight effacement of a third ventricle, no  hydrocephalus.  The patient currently continues with left hemiparesis, left  hemisensory deficit, blurred vision, difficulty with balance.  Swallow study  was done, showed no signs of dysphagia and the patient maintained on a  regular diet.   Currently, she is noted to have problems with mobility and self-care.  Rehab  is considered for further therapies.   PAST MEDICAL HISTORY:  1.  Hypertension.  2.  History of cirrhosis.  3.  Anxiety.   ALLERGIES:  NO KNOWN DRUG ALLERGIES.   FAMILY HISTORY:  Positive for hypertension and cirrhosis.   SOCIAL HISTORY:  The patient is married.  Works in day care.  Active prior  to admission.  Does not currently use tobacco or alcohol.  Home is one-level  with 5 steps at entry.  Husband works third shift and tries to provide  supervision during the day.   HOSPITAL COURSE:  [name] was admitted to rehab on Apr 21, 2006.  The  patient's therapies consisted of PT, OT.   At the time of admission the patient's left face was not droopy, labile and  Catapres was initially increased to t.i.d. basis.   Labs done past admission revealed:  Hemoglobin 13.6, hematocrit 39.4, white  count 4.3.  At check of electrolytes revealed:  Sodium 131, potassium 5.4,  chloride 100, CO2 30, BUN 17, creatinine 1, glucose 97.  LFTs initially  showed AST 45 with T bili of 2.1.  A recheck on Apr 23, 2006 shows  normalization with AST 17, ALT 14, alkaline phosphatase 76, and T. bili 0.9.  Follow-up electrolytes on Apr 28, 2006 showed hypokalemia to have resolved,  with: Sodium 139, potassium 3.7. chloride 100, CO2 32, BUN 20, creatinine  1.2, glucose 105.   The patient was noted to have lability, depressed mood; was started on  Lexapro.  She was noted to have hypersomnia as well as decreased endurance  secondary to her bleed.  She was started on low-dose Ritalin, and has been  able to tolerate Ritalin 5 mg p.o. b.i.d. basis.  Her Catapres dose was  increased further for better blood pressure control.  Blood pressures in the  last few days have been running from 105-130 systolics, 60-80's diastolic.  Heart rates were stable in 50-60's range.  Her p.o. intake has been good.  The patient has been continent of bowel and bladder.  Left hemiparesis is  slowly resolving.  The patient reveals increased function/use of left upper  extremity with increase in a steady balance and occasional heaviness of left  lower extremity.  Sensory awareness continues to impair functional mobility.   The patient is currently at minimal assistance, ambulating 100 feet x2,  supervision for bed transfers requiring a lot of verbal cues .  She can  navigate full stairs with minimal assistance with verbal cues.  The patient  is at supervision to minimal  assistance for ADLs.  Speech therapy has been  working with the patient regarding recalling and memories.  The patient is  able to use strategies for recall that originate.  She is able to read  complex information with some accuracy.  She is able to list her medications  independently.  Recommended is 24-hr supervision, when husband can provide  this.  She will also continue further follow-up PT by [facility]  past discharge on May 05, 2006.   The patient is discharged to home.   DISCHARGE MEDICATIONS:  1.  Cardura 8 mg p.o. q.h.s.  2.  Prilosec over-the-counter once daily 40 mg.  3.  Zetia 10 mg q.h.s.  4.  Verapamil SR 240 mg a day.  5.  Multivitamin per day.  6.  Zocor 20 mg p.o. q.h.s.  7.  K-Dur 20 mEq a day.  8.  Prinivil 10 mg a day.  9.  Lexapro 10 mg a day.  10. Ritalin  5 mg p.o. b.i.d. x2 weeks, then decrease to 1/2 p.o. b.i.d. x2      weeks then discontinue.  11. Catapres 0.3 mg t.i.d.  12. Tylenol 650 mg p.o. q.4-6 h. p.r.n. pain.   ACTIVITY:  As tolerated.  With supervision assistance.   DIET:  Regular.   SPECIAL INSTRUCTIONS:  Further follow-up PT by [facility].  Supervision at 24-hr for now.   FOLLOW-UP:  The patient is to follow-up with Dr. Parke Simmers for blood pressure  check and medical management.  Follow-up with Dr. Pearlean Brownie in 1-2 months.  Follow-up with Dr. Wynn Banker as needed.      Greg Cutter, P.A.      Erick Colace, M.D.  Electronically Signed    PP/MEDQ  D:  05/05/2006  T:  05/06/2006  Job:  [id number]   cc:   Renaye Rakers, M.D.  Fax: [phone]   Pramod P. Pearlean Brownie, MD  Fax: [phone]

## 2011-04-16 NOTE — Consult Note (Signed)
Chelsea [name], Chelsea [name]                  ACCOUNT NO.:  [id number]   MEDICAL RECORD NO.:  [id number]          PATIENT TYPE:  OBV   LOCATION:  4705                         FACILITY:  [facility]   PHYSICIAN:  Arturo Morton. Riley Kill, MD, FACCDATE OF BIRTH:  1951-01-30   DATE OF CONSULTATION:  02/28/2007  DATE OF DISCHARGE:                                 CONSULTATION   CARDIOLOGY CONSULTATION   REASON FOR CONSULTATION:  Chest pain in the setting of hypertension.   PRIMARY CARE PHYSICIAN:  Renaye Rakers, M.D.   HISTORY OF PRESENT ILLNESS:  This is a 60 year old African-American  female with a longstanding history of hypertension who awoke this  morning with substernal chest pain radiating to the back of her left  shoulder.  The patient states that she could not take a deep breath.  She rated the pain as a 10/10.  She described it as a vice-like pain  lasting 1-1/2 to 2 hours.  She came to the emergency room, and  nitroglycerin was given sublingual with some relief and diminishment of  pain, and she has been placed on a nitroglycerin drip.  The pain is now  1/10.  She had no associated diaphoresis, nausea, vomiting, or dyspnea.  She was having some trouble with deep inspirations only.  She is unable  to sit up in bed secondary to pain.  She did take Tylenol and Darvocet  for the pain at home, but she has never had pain like this before.  She  has never had a prior cardiac workup.  On arrival to the emergency room,  the patient's blood pressure was 158/100 on admission.  After  nitroglycerin and aspirin and a nitroglycerin drip, her blood pressure  decreased to 120/70.  She is currently without pain.   PAST MEDICAL HISTORY:  1. Longstanding hypertension stage 3.  2. Hemorrhagic CVA in 2007.  3. Hyperlipidemia.  4. Anxiety and depression.  5. Questionable cirrhosis of the liver.   PAST SURGICAL HISTORY:  Hysterectomy.   SOCIAL HISTORY:  The patient lives in [place] with her husband.  She  works  occasionally at the daycare center.  She is married.  She does not  smoke.  She does not drink.  She does not use illicit drugs.   FAMILY HISTORY:  Mother died of hypertension and peripheral vascular  disease with complications from gangrene.  Father died of liver  cirrhosis.  She has two brothers who are deceased and two brothers who  she does not know well.  Her grandmother died of a myocardial  infarction.   CURRENT MEDICATIONS:  1. Nitroglycerin drip at 5 mcg/kg.  2. Clonidine 0.2 mg q.8 h.  3. Lisinopril 10 mg daily.  4. Potassium 40 mEq with repeating a potassium in four hours.  5. Norvasc 10 mg q.h.s.  6. MDI daily.  7. Enteric-coated aspirin 81 mg daily.  8. Protonix 40 mg daily.  9. Tylenol p.r.n.   The patient denies any medical noncompliance.   CURRENT LABORATORY DATA ON ADMISSION:  The patient's sodium was 141,  potassium was 2.8 on point of  care.  Followup labs revealed a potassium  of 2.4, this was repleted with 40 mEq of p.o. potassium in the ER with  followup additional doses of potassium in four hours.  Her chloride was  105, CO2 43, BUN 11, creatinine 0.9, D-dimer was negative at 0.39.  PT  13.5, INR 1.0.  BNP 101.  Point of care, troponin less than 0.05, CK-MB  less than 1.0, myoglobin 66.3.   PHYSICAL EXAMINATION:  She is awake, alert, oriented without complaints  of chest discomfort or dyspnea at this time.  HEENT:  Head is normocephalic, and atraumatic.  Eyes:  PERRLA.  Mucous  membranes of mouth is pink and moist.  Tongue is midline.  NECK:  Supple.  There is no carotid bruits appreciated.  No thyromegaly  is noted.  CARDIOVASCULAR:  Regular rate and rhythm with S1 and S2 with a 2/6  systolic murmur best heard at the left sternal border at the fifth  intercostal space.  Pulses are 2+ and equal bilaterally.  LUNGS:  Clear to auscultation.  ABDOMEN:  Soft, nontender, normal bowel sounds, no abdominal bruits are  appreciated.  EXTREMITIES:  Without  clubbing, cyanosis, or edema.  NEUROLOGIC:  There is weakness on the left side with unable to feel the  left leg.  She has diminished strength in the left arm.   Chest x-ray reveals stable cardiomegaly, nothing acute.  EKG revealed  normal sinus rhythm with hypertrophy, ventricular rate of 79 beats per  minute.   IMPRESSION:  1. Chest pain, uncertain etiology.  2. Poorly controlled hypertension.  3. Severe hypokalemia without diuretics, question hypoaldosteronism.   PLAN:  This is a 60 year old African-American female who is being seen  and examined by myself and Dr. Shawnie Pons secondary to chest pain  with uncontrolled hypertension.   Our recommendations are:  1. Replace potassium.  2. Blood pressure control with a goal blood pressure of 130/80.  3. Recommend endo consultation to do hypoaldosterone workup with      sodium load and aldosterone and PR levels, if greater than 33, it      is suggestive.  4. Consider chest CT to rule out aortic ulcer or dissection.  Can also      see coronary or calcium, could extend into the abdomen to see if      adenoma.  5. Also do an ultrasound to evaluate for renal artery stenosis.  6. Would not consider stress testing until potassium is normal and      blood pressure controlled.  7. Dr. Parke Simmers will need to do an outpatient workup for      hypoaldosteronism at her discretion.  We will follow, making      further recommendations.  Echocardiogram is ordered and will      monitor blood pressure response and electrolytes concerning      potassium level.      Bettey Mare. Lyman Bishop, NP      Arturo Morton. Riley Kill, MD, [facility]  Electronically Signed    KML/MEDQ  D:  02/28/2007  T:  02/28/2007  Job:  [id number]

## 2011-04-16 NOTE — H&P (Signed)
Chelsea [name], Chelsea [name]                  ACCOUNT NO.:  [id number]   MEDICAL RECORD NO.:  [id number]          PATIENT TYPE:  INP   LOCATION:  2307                         FACILITY:  [facility]   PHYSICIAN:  Gustavus Messing. Orlin Hilding, M.D.DATE OF BIRTH:  06/29/1951   DATE OF ADMISSION:  04/17/2006  DATE OF DISCHARGE:                                HISTORY & PHYSICAL   PRIMARY CARE PHYSICIAN:  Dr. Renaye Rakers   CHIEF COMPLAINT:  Left-sided weakness and numbness.   HISTORY OF PRESENT ILLNESS:  Chelsea [name] is a 60 year old right-handed black  woman with a history of poorly controlled hypertension.  Yesterday around 3 p.m. she had onset of headache, left-sided numbness and  weakness. She waited until this afternoon to come to the emergency room  where a CT scan of the brain showed that she had a right thalamic  hemorrhage.   REVIEW OF SYSTEMS:  Positive for some headache although better now. Some  numbness and blurry vision. No chest pain, no shortness of breath.   PAST MEDICAL HISTORY:  1.  Significant for hypertension.  2.  Remote tubal ligation.  3.  Hysterectomy.   CURRENT MEDICATIONS:  1.  Norvasc perhaps 2.5 mg once daily, the dose is unclear.  2.  Another blood pressure medicine which is taken twice daily that she does      not recall the name of.   ALLERGIES:  No known drug allergies.   SOCIAL HISTORY:  She is married with children. Works in day care. No  cigarette or alcohol use.   FAMILY HISTORY:  Negative for hemorrhage or stroke.   PHYSICAL EXAMINATION:  VITAL SIGNS:  Temperature is 98.3, pulse 85, blood  pressure is currently 165/92, respirations 14, 98% saturation on 2 liters.  HEENT:  Head is normocephalic, atraumatic.  NECK:  Supple without bruits.  HEART:  Regular rate and rhythm.  LUNGS:  Clear to auscultation.  ABDOMEN:  Benign.  NEURO:  On an NIH stroke scale she scores a 5.  1.a -  She is alert, 0.  1.b - She answers 2 out of 2 questions appropriately, 0.  1.c -  Follows 2 out of 2 commands, gets a 0.  1.  Has full gaze, gets a 0.  2.  Visual fields are full independently; with double simultaneous      stimulation, however, there is some extinction on the left. 0.  3.  She has a mild left facial droop, 1.  5.a  She has mild left upper extremity drift, gets a 1.  1.  b No right upper extremity drift, gets a 0.  2.  a. Left lower extremity mild drift, gets a 1.  6.b Right lower extremity no drift, gets a 0.  1.  No ataxia, gets a 0.  2.  Decreased sensation on the left, gets a 1.  3.  No aphasia, 0.  4.  No dysarthria, gets a 0.  5.  Mild visual extinction on the left, gets a 1.  TOTAL SCORE therefore is 5.   CT shows approximately 2 cm right thalamic hemorrhage  with mild  hydrocephalus compared to a CT from January, 2006.   She is a little bit hypokalemic at 3.1, sodium is 141, chloride 103, CO2 of  30, BUN 11, creatinine 0.9, glucose 97 and calcium is 9.5.  PT is 12.5, INR  0.9. White blood cell count 6.0 thousand, hemoglobin 14.4, hematocrit 43.6,  platelets 328,000.   IMPRESSION:  1.  Right thalamic hemorrhage likely hypertensive.  2.  Hypertension.   PLAN:  Admit to ICU. Cardene drip for blood pressure. Repeat CT in the  morning. I have spoken to Dr. Jordan Likes of neurosurgery to be on standby in case  a ventriculostomy is needed.      Catherine A. Orlin Hilding, M.D.  Electronically Signed     CAW/MEDQ  D:  04/17/2006  T:  04/17/2006  Job:  [id number]   cc:   Renaye Rakers, M.D.  Fax: [phone]

## 2011-04-16 NOTE — Procedures (Signed)
[place]  Patient:    Chelsea [name], Chelsea [name] Visit Number: [id number] MRN: [id number]          Service Type: MED Location: 3W [id number] 02 Attending Physician:  Charna Elizabeth Dictated by:   Anselmo Rod, M.D. Proc. Date: 05/11/02 Admit Date:  05/10/2002 Discharge Date: 05/12/2002   CC:         Viviann Spare A. Cleta Alberts, M.D., [facility], [address]   Procedure Report  DATE OF BIRTH:  February 14, 1951.  PROCEDURE:  Esophagogastroduodenoscopy with biopsies.  ENDOSCOPIST:  Anselmo Rod, M.D.  INSTRUMENT USED:  Olympus video panendoscope.  INDICATION FOR PROCEDURE:  A 60 year old African-American female with a history of black, tarry stools, hemoglobin of 10.4 g/dl, with BUN of 50.  Rule out an upper GI source of bleeding.  PREPROCEDURE PREPARATION:  Informed consent was procured from the patient. The patient was fasted for eight hours prior to the procedure, and PT/PTT was checked.  The patient was given 2 L of IV fluids prior to the procedure.  PREPROCEDURE PHYSICAL:  VITAL SIGNS:  The patient had stable vital signs.  NECK:  Supple.  CHEST:  Clear to auscultation.  S1, S2 regular.  ABDOMEN:  Soft, nondistended, nontender, with a well-healed surgical scar in the suprapubic area from previous abdominal hysterectomy and a small surgical scar at the umbilicus from her previous BTL.  There was no evidence of any hepatosplenomegaly, no masses palpable.  RECTAL:  As per Mechele Collin L. Effie Shy, M.D., revealed black stool, was strongly guaiac-positive.  DESCRIPTION OF PROCEDURE:  The patient was placed in the left lateral decubitus position and sedated with 60 mg of Demerol and 6 mg of Versed intravenously.  Once the patient was adequately sedate and maintained on low-flow oxygen and continuous cardiac monitoring, the Olympus video panendoscope was advanced through the mouthpiece, over the tongue, into the esophagus under direct vision.  There was  a small patch of salmon-pink mucosa about the Z-line, indicating what might be Barretts mucosa.  This was not biopsied for pathology because of the patients acute GI bleed.  On further advancing the scope into the stomach there was evidence of a superficial antral ulcer with some oozing from the margins of the ulcer.  No definite visible vessel was identified.  The ulcer had a clean base.  A small superficial ulcer was also seen in the duodenal bulb.  Biopsies were done from the antrum to rule out the presence of Helicobacter pylori by CLOtest.  The small bowel distal to the above appeared normal.  There was no outlet obstruction.  The patient tolerated the procedure well without complication.  IMPRESSION: 1. Antral ulcer with mild oozing from the margins of the ulcer, otherwise    clean-based.  No visible vessel present. 2. Small superficial ulcer in the duodenal bulb. 3. 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  RECOMMENDATIONS: 1. Start the patient on clear liquid diet. 2. Protonix 40 mg IV for now. 3. Serial CBCs. 4. Carafate slurry 1 g q.i.d. between meals and at bedtime. 5. Avoid all nonsteroidals including aspirin and Goody powders. 6. Anticipate discharge in a.m. 7. Repeat EGD in the next six to eight weeks to document healing of the ulcer    and to biopsy the esophagus to rule out Barretts mucosa. Dictated by:   Anselmo Rod, M.D. Attending Physician:  Charna Elizabeth DD:  05/11/02 TD:  05/13/02 Job: [id number]

## 2011-04-16 NOTE — H&P (Signed)
Chelsea [name], Chelsea [name]                  ACCOUNT NO.:  [id number]   MEDICAL RECORD NO.:  [id number]          PATIENT TYPE:  OBV   LOCATION:  1828                         FACILITY:  [facility]   PHYSICIAN:  Lonia Blood, M.D.       DATE OF BIRTH:  01-20-1951   DATE OF ADMISSION:  02/28/2007  DATE OF DISCHARGE:                              HISTORY & PHYSICAL   PRIMARY CARE PHYSICIAN:  Dr. Renaye [name]   CHIEF COMPLAINT:  Chest pain.   HISTORY OF PRESENT ILLNESS:  Chelsea [name] is a 60 year old African  American woman with past medical history of severe stage 3 hypertension  status post a thalamic hemorrhagic stroke, who reports that woke up this  morning with severe squeezing chest pain located retrosternal.  The  patient was brought to the ER by the family and received some  nitroglycerin which was found relieve the chest pressure.  The patient  is on multiple blood pressure medications and she reports being  compliant to them.  The patient denies any diaphoresis and reports that  the pain radiated to the left shoulder.  The patient does not have  abdominal pain, nausea or vomiting.   HOME MEDICATIONS:  1. Doxazosin 40 mg daily.  2. Zetia 10 mg daily.  3. Darvocet as needed,  4. Clonidine 0.3 mg three times a day.  5. Lisinopril 10 mg daily.  6. Omeprazole 40 mg daily.  7. Potassium chloride 40 mEq daily.   SOCIAL HISTORY:  The patient is married.  She has got three children  from a previous marriage.  She denies any alcohol use, cocaine use, or  smoking cigarettes.   FAMILY HISTORY:  The patient's mother had a myocardial infarction at age  41 and the patient's grandmother had a stroke at age 38.  The patient  has four brothers but she really does not know their medical history.   PAST MEDICAL HISTORY:  As above.  Also, the patient has had tubal  ligation and hysterectomy.  There is a mention in the records of  osteoporosis but the patient vehemently denies this.   REVIEW OF SYSTEMS:  As  per HPI.  Also, the patient has chronic left-side  numbness left from a thalamic stroke, and she has got some left facial  numbness and some chronic facial droop.  Other systems are negative.   PHYSICAL EXAMINATION UPON ADMISSION:  VITAL SIGNS:  Temperature 98.1,  blood pressure 158/100, pulse 90, respirations 18, saturation 98% on  room air.  GENERAL APPEARANCE:  African American woman, well-developed, well-  nourished, in no acute distress, sitting on the stretcher.  Alert and  oriented to place, person and time.  HEENT:  Head normocephalic, atraumatic.  Eyes:  Pupils equal and round,  reactive to light and accommodation.  Extraocular movement was intact.  Face slightly asymmetric with some right deviation, left facial  numbness.  Throat clear.  NECK:  Supple.  No JVD, no carotid bruits.  CHEST:  Clear to auscultation bilaterally without wheeze, rhonchi or  crackles.  HEART:  Regular rate and rhythm with occasional  premature contractions.  ABDOMEN:  Soft, nontender, nondistended, bowel sounds are present.  LOWER EXTREMITIES:  Have no edema.  NEUROLOGIC:  Cranial nerves III, IV, VI appear to be intact.  VII has  slight right facial droop.  Strength 5/5 on the right side, 3/5 right  left upper extremity with significant left upper extremity clumsiness.   LABORATORY VALUES AT THE TIME OF ADMISSION:  Sodium 149, potassium 2.8,  chloride 105, BUN 11, creatinine is 0.9.  PT 13.5, INR 1.0.  BNP 101.  D-  dimer 0.39.  EKG shows sinus rhythm with significant right and left  atrial enlargement and nonspecific ST-T changes.  QT interval is  measured at 483.   ASSESSMENT AND PLAN:  1. Unstable angina.  At this point in time it is difficult to assess      if the patient's primary problem is coronary disease versus she has      got unstable angina from hypertension.  For now will admit the      patient to telemetry, rule out myocardial infarction with cardiac      enzymes, get the patient on  nitroglycerin and continue her      clonidine and ACE inhibitors.  Also, the patient on low-dose      aspirin but will not anticoagulate unless she is has got positive      troponins, given her history of hemorrhagic stroke.  2. Hypokalemia.  This is unclear about the cause of this.  Given this      patient's severe hypertension and no use of diuretics, I am worried      about the possibility of a hyperaldosteronism causing severe      hypertension and hypokalemia.  I will monitor the patient closely.      At this point in time, though, the renin/aldosterone ratio will not      be very helpful since the patient is already on an ACE inhibitor.      Empirically treating the patient with an ACE inhibitor and      carefully replacing the potassium should be a good starting point.      Lonia Blood, M.D.  Electronically Signed     SL/MEDQ  D:  02/28/2007  T:  02/28/2007  Job:  [id number]   cc:   Chelsea [name], M.D.

## 2011-04-16 NOTE — Discharge Summary (Signed)
NAMEHONESTIE, KULIK                  ACCOUNT NO.:  [id number]   MEDICAL RECORD NO.:  [id number]          PATIENT TYPE:  INP   LOCATION:  3012                         FACILITY:  [facility]   PHYSICIAN:  Marlan Palau, M.D.  DATE OF BIRTH:  01/15/1951   DATE OF ADMISSION:  04/17/2006  DATE OF DISCHARGE:  04/21/2006                                 DISCHARGE SUMMARY   DIAGNOSES AT TIME OF DISCHARGE:  1.  Acute right thalamic hypertensive hemorrhage.  2.  Hypertension.  3.  Hysterectomy.  4.  Remote tubal ligation.   DISCHARGE MEDICATIONS:  1.  Potassium 20 mEq b.i.d.  2.  Cardura 8 mg q.h.s.  3.  Lasix 40 mg daily.  4.  Catapres 0.2 mg b.i.d.  5.  Zetia 10 mg a day.  6.  Verapamil 120 mg a day.  7.  Zocor 20 mg a day.  8.  Protonix 40 mg a day.   STUDIES PERFORMED:  CT of the brain on admission shows acute hemorrhagic  stroke involving the right thalamus.  There is surrounding edema with mild  mass effect on the third ventricle and just to the midline on the left,  approximately 3 mm.   Follow-up CT at 24 hours shows right thalamic hemorrhage again noted.  Slight increase in vasogenic edema and slight increase in mass effect with  mild obstructive hydrocephalus.   CT of the brain at 72 hours shows stable right thalamic intracranial  hemorrhage with localized mass effect and surrounding edema.  There is  slight effacement of the third ventricle in the midline with minimal right  to left shift but no significant hydrocephalus.  Overall, exam is stable.   EKG shows normal sinus rhythm.  Left ventricular hypertrophy.  Nonspecific  ST/T wave abnormalities, prolonged Q-T.  The T wave abnormality has improved  from EKG of May 31, 2004.   MRI of the brain shows right thalamic hematoma with surrounding mass effect  secondary to vasogenic edema related to hematoma.  There is mild mass effect  on the inferior aspect of the third ventricle.  No imaging evidence of  abnormal enhancement or  blood vessel ending from periphery of this  hemorrhage.  There are mild sinusitis changes.   MRA of the head shows no evidence of occlusion, stenosis, dissections, or  aneurysms.   Transcranial Doppler, carotid Doppler, 2D echocardiogram not performed.   LABORATORY STUDIES:  Hemoglobin A1C 5.4, homocysteine 12.8, cholesterol 203,  triglycerides 105, HDL 47, LDL 135.  Chemistry was normal.  Urine drug  screen not performed.   HISTORY OF PRESENT ILLNESS:  Ms. Pauli is a 60 year old right-handed African-  American female with a history of poorly controlled hypertension.  The day  prior to admission at 3:00 p.m., she had a sudden onset of headache, left-  sided numbness and weakness.  She waited until the afternoon of admission to  come to the emergency room where a CT of the brain showed that she had a  right thalamic hemorrhage.  She was admitted to the ICU for blood pressure,  and neurosurgery  was asked to consult.   HOSPITAL COURSE:  CT remained stable with no extension of hemorrhage once  hospitalized.  Her blood pressure was able to be controlled on the IV  Cardene, and p.o. medicines were started.  IV Cardene was discontinued, and  the patient was transferred to the floor.  She did have evaluation with  rehab, including , PT, OT, and speech, and all agreed that she needed  inpatient rehab for left hemiparesis, ataxia, and decreased sensation.  She  is off antiplatelets secondary to hemorrhage.  She needs tight risk factor  control, as she has as new diagnosis of dyslipidemia and was started on  Zocor.  She does have some mild cognative difficulties, and this will also  be addressed on rehab.   CONDITION ON DISCHARGE:  Patient alert and oriented x3.  She is cooperative.  She does have some left hemiparesis, dysphagia as well as limbs.  She has  decreased sensation on the left.  Her chest is clear to auscultation.  Heart  rate is regular.  Her extremities are without edema.    DISCHARGE PLAN:  1.  Transfer to rehab for continuation of PT, OT, and speech therapy.  2.  Follow up with primary care physician one month after discharge from      rehab.  Needs tight risk factor control.  Goal of systolic blood      pressure less than 130 and goal LDL less than 100.  3.  Follow up with Dr. Delia Heady in 2-3 months.      Annie Main, N.P.      Marlan Palau, M.D.  Electronically Signed    SB/MEDQ  D:  04/21/2006  T:  04/21/2006  Job:  [id number]   cc:   Pramod P. Pearlean Brownie, MD  Fax: [phone]   Renaye Rakers, M.D.  Fax: [phone]

## 2011-07-14 ENCOUNTER — Ambulatory Visit: Payer: Medicaid Other | Admitting: Cardiology

## 2011-08-06 ENCOUNTER — Encounter: Payer: Self-pay | Admitting: Cardiology

## 2011-08-06 ENCOUNTER — Ambulatory Visit (INDEPENDENT_AMBULATORY_CARE_PROVIDER_SITE_OTHER): Payer: Medicare Other | Admitting: Cardiology

## 2011-08-06 DIAGNOSIS — I350 Nonrheumatic aortic (valve) stenosis: Secondary | ICD-10-CM

## 2011-08-06 DIAGNOSIS — E785 Hyperlipidemia, unspecified: Secondary | ICD-10-CM

## 2011-08-06 DIAGNOSIS — I359 Nonrheumatic aortic valve disorder, unspecified: Secondary | ICD-10-CM

## 2011-08-06 DIAGNOSIS — I10 Essential (primary) hypertension: Secondary | ICD-10-CM

## 2011-08-06 DIAGNOSIS — I2510 Atherosclerotic heart disease of native coronary artery without angina pectoris: Secondary | ICD-10-CM

## 2011-08-06 NOTE — Patient Instructions (Signed)
Your physician recommends that you schedule a follow-up appointment in: 6 months with Dr. Riley Kill. Your physician has requested that you have an echocardiogram. Echocardiography is a painless test that uses sound waves to create images of your heart. It provides your doctor with information about the size and shape of your heart and how well your heart's chambers and valves are working. This procedure takes approximately one hour. There are no restrictions for this procedure. In 6 months.

## 2011-08-08 NOTE — Progress Notes (Signed)
HPI:  Her chest pain is notably better.  None since before. We reviewed her meds.  Most have been prescribed by Dr. Parke Simmers.  She is doing quite well.    Current Outpatient Prescriptions  Medication Sig Dispense Refill  . amLODipine-valsartan (EXFORGE) 10-160 MG per tablet Take 1 tablet by mouth daily.        Marland Kitchen aspirin 81 MG tablet Take 81 mg by mouth daily.        . celecoxib (CELEBREX) 200 MG capsule Take 200 mg by mouth daily.        . cloNIDine (CATAPRES) 0.3 MG tablet Take 0.3 mg by mouth 2 (two) times daily.       . cyanocobalamin 100 MCG tablet Take 100 mcg by mouth daily.       Marland Kitchen darifenacin (ENABLEX) 7.5 MG 24 hr tablet Take 7.5 mg by mouth daily.        Marland Kitchen doxazosin (CARDURA) 8 MG tablet Take 8 mg by mouth at bedtime.        Marland Kitchen ezetimibe (ZETIA) 10 MG tablet Take 10 mg by mouth daily.        Marland Kitchen lisinopril (PRINIVIL,ZESTRIL) 10 MG tablet Take 10 mg by mouth daily.        . meloxicam (MOBIC) 15 MG tablet Take 15 mg by mouth daily.        . Multiple Vitamin (MULTIVITAMIN) tablet Take 1 tablet by mouth daily.        . niacin 500 MG tablet Take 500 mg by mouth daily with breakfast.        . omeprazole (PRILOSEC) 20 MG capsule Take 20 mg by mouth 2 (two) times daily.        . verapamil (CALAN) 80 MG tablet Take 80 mg by mouth 2 (two) times daily.          No Known Allergies  Past Medical History  Diagnosis Date  . Hypertension   . Hyperlipidemia   . Cerebrovascular accident     hx of  . Chest pain   . Obesity   . Peptic ulcer disease   . Hx of hysterectomy     Past Surgical History  Procedure Date  . Tubal ligation     Family History  Problem Relation Age of Onset  . Hypertension Mother     deceased  . Peripheral vascular disease Mother   . Cirrhosis Father     DICEASED  . Heart attack Other     grandmother deceased    History   Social History  . Marital Status: Married    Spouse Name: N/A    Number of Children: N/A  . 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 Smoking status: Never Smoker   . Smokeless tobacco: Not on file  . Alcohol Use: No  . Drug Use: No  . Sexually Active: Not on file   Other Topics Concern  . Not on file   Social History Narrative  . No narrative on file    ROS: Please see the HPI.  All other systems reviewed and negative.  PHYSICAL EXAM:  BP 110/74  Pulse 60  Ht 5\' 6"  (1.676 m)  Wt 185 lb (83.915 kg)  BMI 29.86 kg/m2  General: Well developed, well nourished, in no acute distress. Head:  Normocephalic and atraumatic. Neck: no JVD Lungs:  Clear to auscultation and percussion. Heart: Normal S1 and S2.  No murmur, rubs.  No definite AI murmur.  Positive S4, no S3.  Abdomen:  Normal bowel sounds; soft; non tender; no organomegaly Pulses: Pulses normal in all 4 extremities. Extremities: No clubbing or cyanosis. No edema. Neurologic: Alert and oriented x 3.  EKG:  SB.  LVH.  Possible LAE.   ASSESSMENT AND PLAN:

## 2011-08-15 NOTE — Assessment & Plan Note (Signed)
She is seeing Dr. Parke Simmers regularly who is adjusting meds.  Will suggest that she relook at meds to consider just one calcium antagonist.

## 2011-08-15 NOTE — Assessment & Plan Note (Signed)
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  We need to keep watching this over time.  For now, she should remain on medical therapy.

## 2011-08-15 NOTE — Assessment & Plan Note (Signed)
Treatment per Dr. Parke Simmers.

## 2011-10-04 ENCOUNTER — Other Ambulatory Visit (HOSPITAL_COMMUNITY): Payer: Self-pay | Admitting: Family Medicine

## 2011-10-04 DIAGNOSIS — Z1231 Encounter for screening mammogram for malignant neoplasm of breast: Secondary | ICD-10-CM

## 2011-10-10 ENCOUNTER — Encounter (HOSPITAL_COMMUNITY): Payer: Self-pay | Admitting: Emergency Medicine

## 2011-10-10 ENCOUNTER — Emergency Department (HOSPITAL_COMMUNITY)
Admission: EM | Admit: 2011-10-10 | Discharge: 2011-10-10 | Disposition: A | Discharge status: Home / Self Care | Payer: Medicare Other | Attending: Emergency Medicine | Admitting: Emergency Medicine

## 2011-10-10 DIAGNOSIS — Z7982 Long term (current) use of aspirin: Secondary | ICD-10-CM | POA: Insufficient documentation

## 2011-10-10 DIAGNOSIS — I10 Essential (primary) hypertension: Secondary | ICD-10-CM | POA: Insufficient documentation

## 2011-10-10 DIAGNOSIS — Z8673 Personal history of transient ischemic attack (TIA), and cerebral infarction without residual deficits: Secondary | ICD-10-CM | POA: Insufficient documentation

## 2011-10-10 DIAGNOSIS — T783XXA Angioneurotic edema, initial encounter: Secondary | ICD-10-CM | POA: Insufficient documentation

## 2011-10-10 DIAGNOSIS — H579 Unspecified disorder of eye and adnexa: Secondary | ICD-10-CM | POA: Insufficient documentation

## 2011-10-10 DIAGNOSIS — E785 Hyperlipidemia, unspecified: Secondary | ICD-10-CM | POA: Insufficient documentation

## 2011-10-10 DIAGNOSIS — H5789 Other specified disorders of eye and adnexa: Secondary | ICD-10-CM | POA: Insufficient documentation

## 2011-10-10 DIAGNOSIS — Z79899 Other long term (current) drug therapy: Secondary | ICD-10-CM | POA: Insufficient documentation

## 2011-10-10 DIAGNOSIS — X58XXXA Exposure to other specified factors, initial encounter: Secondary | ICD-10-CM | POA: Insufficient documentation

## 2011-10-10 DIAGNOSIS — Z9889 Other specified postprocedural states: Secondary | ICD-10-CM | POA: Insufficient documentation

## 2011-10-10 DIAGNOSIS — T7840XA Allergy, unspecified, initial encounter: Secondary | ICD-10-CM

## 2011-10-10 MED ORDER — LORATADINE 10 MG PO TABS: 10.0000 mg | ORAL_TABLET | Freq: Every day | ORAL | Status: DC

## 2011-10-10 MED ORDER — PREDNISONE 10 MG PO TABS: 20.0000 mg | ORAL_TABLET | Freq: Two times a day (BID) | ORAL | Status: AC

## 2011-10-10 NOTE — ED Provider Notes (Signed)
History     CSN: [id number] Arrival date & time: 10/10/2011  1:37 PM   First MD Initiated Contact with Patient 10/10/11 1703      Chief Complaint  Patient presents with  . Facial Swelling    (Consider location/radiation/quality/duration/timing/severity/associated sxs/prior treatment) HPI Comments: Started yesterday with bilateral eye swelling, itching.  No new contacts, exposures.  Patient is a 60 y.o. female presenting with allergic reaction. The history is provided by the patient.  Allergic Reaction The primary symptoms are  angioedema. The current episode started yesterday. The problem has been gradually worsening.  Associated with: unknown. Associated symptoms comments: Eye swelling .     Past Medical History  Diagnosis Date  . Hypertension   . Hyperlipidemia   . Cerebrovascular accident     hx of  . Chest pain   . Obesity   . Peptic ulcer disease   . Hx of hysterectomy     Past Surgical History  Procedure Date  . Tubal ligation   . Abdominal hysterectomy     Family History  Problem Relation Age of Onset  . Hypertension Mother     deceased  . Peripheral vascular disease Mother   . Cirrhosis Father     DICEASED  . Heart attack Other     grandmother deceased    History  Substance Use Topics  . Smoking status: Never Smoker   . Smokeless tobacco: Not on file  . Alcohol Use: No    OB History    Grav Para Term Preterm Abortions TAB SAB Ect Mult Living                  Review of Systems  All other systems reviewed and are negative.    Allergies  Review of patient's allergies indicates no known allergies.  Home Medications   Current Outpatient Rx  Name Route Sig Dispense Refill  . AMLODIPINE BESYLATE-VALSARTAN 10-160 MG PO TABS Oral Take 1 tablet by mouth daily.      . ASPIRIN 81 MG PO TABS Oral Take 81 mg by mouth daily.      . CELECOXIB 200 MG PO CAPS Oral Take 200 mg by mouth daily.      Marland Kitchen CLONIDINE HCL 0.3 MG PO TABS Oral Take 0.3 mg  by mouth 2 (two) times daily.     . CYANOCOBALAMIN 100 MCG PO TABS Oral Take 100 mcg by mouth daily.     Marland Kitchen DARIFENACIN HYDROBROMIDE 7.5 MG PO TB24 Oral Take 7.5 mg by mouth daily.      Marland Kitchen DOXAZOSIN MESYLATE 8 MG PO TABS Oral Take 8 mg by mouth at bedtime.      Marland Kitchen EZETIMIBE 10 MG PO TABS Oral Take 10 mg by mouth daily.      Marland Kitchen LISINOPRIL 10 MG PO TABS Oral Take 10 mg by mouth daily.      . MELOXICAM 15 MG PO TABS Oral Take 15 mg by mouth daily.      Marland Kitchen ONE-DAILY MULTI VITAMINS PO TABS Oral Take 1 tablet by mouth daily.      Marland Kitchen NIACIN 500 MG PO TABS Oral Take 500 mg by mouth daily with breakfast.      . OMEPRAZOLE 20 MG PO CPDR Oral Take 20 mg by mouth 2 (two) times daily.      Marland Kitchen VERAPAMIL HCL 80 MG PO TABS Oral Take 80 mg by mouth 2 (two) times daily.        BP 148/74  Pulse 89  Temp(Src) 97.5 F (36.4 C) (Oral)  Resp 16  SpO2 96%  Physical Exam  Nursing note and vitals reviewed. Constitutional: She is oriented to person, place, and time. She appears well-developed and well-nourished.  HENT:  Head: Normocephalic and atraumatic.  Eyes:       Both eyes are noted to have swelling of the periorbital tissues.  Conjunctive and sclera are normal.  Neck: Normal range of motion. Neck supple.  Cardiovascular: Normal rate and regular rhythm.   Pulmonary/Chest: Effort normal and breath sounds normal. No respiratory distress.  Musculoskeletal: Normal range of motion.  Neurological: She is alert and oriented to person, place, and time.  Skin: Skin is warm and dry.    ED Course  Procedures (including critical care time)  Labs Reviewed - No data to display No results found.   No diagnosis found.    MDM  Allergic swelling around eyes.        Geoffery Lyons, MD 10/10/11 1728

## 2011-10-10 NOTE — ED Notes (Signed)
Pt awoke today with swelling to B/L eyes.  Pt denies change in products. Pt did have her hair colored on Friday and may have rubbed her eyes.

## 2011-11-02 ENCOUNTER — Ambulatory Visit (HOSPITAL_COMMUNITY)
Admission: RE | Admit: 2011-11-02 | Discharge: 2011-11-02 | Disposition: A | Discharge status: Home / Self Care | Payer: Medicare Other | Source: Ambulatory Visit | Attending: Family Medicine | Admitting: Family Medicine

## 2011-11-02 DIAGNOSIS — Z1231 Encounter for screening mammogram for malignant neoplasm of breast: Secondary | ICD-10-CM | POA: Insufficient documentation

## 2011-12-14 DIAGNOSIS — I10 Essential (primary) hypertension: Secondary | ICD-10-CM | POA: Diagnosis not present

## 2012-01-31 DIAGNOSIS — F4323 Adjustment disorder with mixed anxiety and depressed mood: Secondary | ICD-10-CM | POA: Diagnosis not present

## 2012-01-31 DIAGNOSIS — I959 Hypotension, unspecified: Secondary | ICD-10-CM | POA: Diagnosis not present

## 2012-01-31 DIAGNOSIS — I10 Essential (primary) hypertension: Secondary | ICD-10-CM | POA: Diagnosis not present

## 2012-02-14 ENCOUNTER — Ambulatory Visit: Payer: Medicare Other | Admitting: Cardiology

## 2012-02-18 DIAGNOSIS — I10 Essential (primary) hypertension: Secondary | ICD-10-CM | POA: Diagnosis not present

## 2012-02-29 ENCOUNTER — Ambulatory Visit (INDEPENDENT_AMBULATORY_CARE_PROVIDER_SITE_OTHER): Payer: Medicare Other | Admitting: Cardiology

## 2012-02-29 VITALS — BP 114/75 | HR 53 | Wt 191.0 lb

## 2012-02-29 DIAGNOSIS — I351 Nonrheumatic aortic (valve) insufficiency: Secondary | ICD-10-CM

## 2012-02-29 DIAGNOSIS — I10 Essential (primary) hypertension: Secondary | ICD-10-CM | POA: Diagnosis not present

## 2012-02-29 DIAGNOSIS — R072 Precordial pain: Secondary | ICD-10-CM | POA: Diagnosis not present

## 2012-02-29 DIAGNOSIS — I359 Nonrheumatic aortic valve disorder, unspecified: Secondary | ICD-10-CM

## 2012-02-29 DIAGNOSIS — E785 Hyperlipidemia, unspecified: Secondary | ICD-10-CM

## 2012-02-29 NOTE — Assessment & Plan Note (Signed)
Followed by Dr. Bland  

## 2012-02-29 NOTE — Progress Notes (Signed)
HPI:  She is doing pretty well.  No major complaints.  Sees Dr. Parke Simmers on a regular basis.  Currently without major cardiac symptoms.  Nonsteroidals mostly helped her chest pain.  I did review her meds in detail, and she thinks she is taking both meloxicam and celebrex.  Denies current symptoms.  Studies suggest some AI, normal coronaries.  Being followed for this at this point.    Current Outpatient Prescriptions  Medication Sig Dispense Refill  . ALPRAZolam (XANAX) 0.5 MG tablet Take 0.5 mg by mouth at bedtime as needed.      Marland Kitchen amLODipine-valsartan (EXFORGE) 10-160 MG per tablet Take 1 tablet by mouth daily.        Marland Kitchen aspirin 81 MG tablet Take 81 mg by mouth daily.        . celecoxib (CELEBREX) 200 MG capsule Take 200 mg by mouth daily.        . citalopram (CELEXA) 10 MG tablet Take 10 mg by mouth daily.      . cloNIDine (CATAPRES) 0.3 MG tablet Take 0.3 mg by mouth 2 (two) times daily.       . cyanocobalamin 100 MCG tablet Take 100 mcg by mouth daily.       Marland Kitchen darifenacin (ENABLEX) 7.5 MG 24 hr tablet Take 7.5 mg by mouth daily.        Marland Kitchen doxazosin (CARDURA) 8 MG tablet Take 8 mg by mouth at bedtime.        Marland Kitchen ezetimibe (ZETIA) 10 MG tablet Take 10 mg by mouth daily.        Marland Kitchen lisinopril (PRINIVIL,ZESTRIL) 10 MG tablet Take 10 mg by mouth daily.        Marland Kitchen loratadine (CLARITIN) 10 MG tablet Take 1 tablet (10 mg total) by mouth daily.  10 tablet  0  . meloxicam (MOBIC) 15 MG tablet Take 15 mg by mouth daily.        . Multiple Vitamin (MULTIVITAMIN) tablet Take 1 tablet by mouth daily.        Marland Kitchen omeprazole (PRILOSEC) 20 MG capsule Take 20 mg by mouth 2 (two) times daily.        . verapamil (CALAN) 80 MG tablet Take 80 mg by mouth 2 (two) times daily.          No Known Allergies  Past Medical History  Diagnosis Date  . Hypertension   . Hyperlipidemia   . Cerebrovascular accident     hx of  . Chest pain   . Obesity   . Peptic ulcer disease   . Hx of hysterectomy     Past Surgical  History  Procedure Date  . Tubal ligation   . Abdominal hysterectomy     Family History  Problem Relation Age of Onset  . Hypertension Mother     deceased  . Peripheral vascular disease Mother   . Cirrhosis Father     DICEASED  . Heart attack Other     grandmother deceased    History   Social History  . Marital Status: Married    Spouse Name: N/A    Number of Children: N/A  . 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 Smoking status: Never Smoker   . Smokeless tobacco: Not on file  . Alcohol Use: No  . Drug Use: No  . Sexually Active:  Not on file   Other Topics Concern  . Not on file   Social History Narrative  . No narrative on file    ROS: Please see the HPI.  All other systems reviewed and negative.  PHYSICAL EXAM:  BP 114/75  Pulse 53  Wt 191 lb (86.637 kg)  General: Well developed, well nourished, in no acute distress. Head:  Normocephalic and atraumatic. Neck: no JVD Lungs: Clear to auscultation and percussion. Heart: Normal S1 and S2.  Soft AI murmur.  No peripheral findings of AI such as quinkes, or bounding pulsations.     Pulses: Pulses normal in all 4 extremities. Extremities: No clubbing or cyanosis. No edema. Neurologic: Alert and oriented x 3.  EKG:  SB.  Poss LAE.  ECHO 2012  Study Conclusions  - Left ventricle: LV filling pressure may be mildly increased. The cavity size was normal. Wall thickness was increased in a pattern of mild LVH. The estimated ejection fraction was 60%. Wall motion was normal; there were no regional wall motion abnormalities. - Aortic valve: Sclerosis without stenosis. Mild to moderate regurgitation. - Left atrium: The atrium was mildly dilated. - Right atrium: The atrium was mildly dilated. - Pulmonary arteries: PA peak pressure: 34mm Hg (S). - Impressions: Severity of AI seems similar to  06/2010. Wall motion now is better than reported in 06/2010. Wall motion in 2011 may have been somewhat better than reported. Impressions:  - Severity of AI seems similar to 06/2010. Wall motion now is better than reported in 06/2010. Wall motion in 2011 may have been somewhat better than reported.    ASSESSMENT AND PLAN:

## 2012-02-29 NOTE — Assessment & Plan Note (Addendum)
BP is well controlled, and she has moderate bradycardia including clonidine and verapamil.  Would stop the latter, and instructed to do so. This can be reassessed in her follow up with Dr. Parke Simmers.

## 2012-02-29 NOTE — Patient Instructions (Addendum)
Your physician wants you to follow-up in: 6 months.  You will receive a reminder letter in the mail two months in advance. If you don't receive a letter, please call our office to schedule the follow-up appointment.  Talk with Dr. Parke Simmers about Meloxicam and Celebrex combination. Also talk with Dr. Parke Simmers about the possibility of stopping Verapamil  Your physician has requested that you have an echocardiogram. Echocardiography is a painless test that uses sound waves to create images of your heart. It provides your doctor with information about the size and shape of your heart and how well your heart's chambers and valves are working. This procedure takes approximately one hour. There are no restrictions for this procedure.  To be done in 6 months.

## 2012-02-29 NOTE — Assessment & Plan Note (Signed)
Improved with NSAID.  However, she appears to be on two, and mentioned "Squeezing of her kidneys" with labs with Dr. Parke Simmers. She will see Dr. Parke Simmers in a few weeks, and I instructed her to bring the issue up with regard to her current celebrex, and meloxicam, especially with other agents.  Will defer to Dr. Parke Simmers as she has been following the patient closely.

## 2012-02-29 NOTE — Assessment & Plan Note (Signed)
Exam is stable.  When I see her in six months we will repeat her echocardiogram.

## 2012-03-16 DIAGNOSIS — H1045 Other chronic allergic conjunctivitis: Secondary | ICD-10-CM | POA: Diagnosis not present

## 2012-03-16 DIAGNOSIS — H04129 Dry eye syndrome of unspecified lacrimal gland: Secondary | ICD-10-CM | POA: Diagnosis not present

## 2012-03-16 DIAGNOSIS — H47649 Disorders of visual cortex in (due to) vascular disorders, unspecified side of brain: Secondary | ICD-10-CM | POA: Diagnosis not present

## 2012-03-17 DIAGNOSIS — I6789 Other cerebrovascular disease: Secondary | ICD-10-CM | POA: Diagnosis not present

## 2012-03-17 DIAGNOSIS — I10 Essential (primary) hypertension: Secondary | ICD-10-CM | POA: Diagnosis not present

## 2012-03-17 DIAGNOSIS — F4322 Adjustment disorder with anxiety: Secondary | ICD-10-CM | POA: Diagnosis not present

## 2012-04-14 DIAGNOSIS — M2040 Other hammer toe(s) (acquired), unspecified foot: Secondary | ICD-10-CM | POA: Diagnosis not present

## 2012-04-14 DIAGNOSIS — L600 Ingrowing nail: Secondary | ICD-10-CM | POA: Diagnosis not present

## 2012-04-14 DIAGNOSIS — M79609 Pain in unspecified limb: Secondary | ICD-10-CM | POA: Diagnosis not present

## 2012-04-20 DIAGNOSIS — L600 Ingrowing nail: Secondary | ICD-10-CM | POA: Diagnosis not present

## 2012-05-08 DIAGNOSIS — L03039 Cellulitis of unspecified toe: Secondary | ICD-10-CM | POA: Diagnosis not present

## 2012-05-08 DIAGNOSIS — L97509 Non-pressure chronic ulcer of other part of unspecified foot with unspecified severity: Secondary | ICD-10-CM | POA: Diagnosis not present

## 2012-05-16 DIAGNOSIS — I10 Essential (primary) hypertension: Secondary | ICD-10-CM | POA: Diagnosis not present

## 2012-05-16 DIAGNOSIS — I6789 Other cerebrovascular disease: Secondary | ICD-10-CM | POA: Diagnosis not present

## 2012-05-16 DIAGNOSIS — F4322 Adjustment disorder with anxiety: Secondary | ICD-10-CM | POA: Diagnosis not present

## 2012-05-22 DIAGNOSIS — T8189XA Other complications of procedures, not elsewhere classified, initial encounter: Secondary | ICD-10-CM | POA: Diagnosis not present

## 2012-08-15 DIAGNOSIS — I6789 Other cerebrovascular disease: Secondary | ICD-10-CM | POA: Diagnosis not present

## 2012-08-15 DIAGNOSIS — R0602 Shortness of breath: Secondary | ICD-10-CM | POA: Diagnosis not present

## 2012-08-15 DIAGNOSIS — I10 Essential (primary) hypertension: Secondary | ICD-10-CM | POA: Diagnosis not present

## 2012-08-29 ENCOUNTER — Encounter: Payer: Self-pay | Admitting: Cardiology

## 2012-08-29 ENCOUNTER — Ambulatory Visit (INDEPENDENT_AMBULATORY_CARE_PROVIDER_SITE_OTHER): Payer: Medicare Other | Admitting: Cardiology

## 2012-08-29 ENCOUNTER — Ambulatory Visit (HOSPITAL_COMMUNITY): Payer: Medicare Other | Attending: Cardiology | Admitting: Radiology

## 2012-08-29 VITALS — BP 96/64 | HR 62 | Resp 18 | Ht 67.0 in | Wt 184.0 lb

## 2012-08-29 DIAGNOSIS — I080 Rheumatic disorders of both mitral and aortic valves: Secondary | ICD-10-CM | POA: Diagnosis not present

## 2012-08-29 DIAGNOSIS — I10 Essential (primary) hypertension: Secondary | ICD-10-CM | POA: Diagnosis not present

## 2012-08-29 DIAGNOSIS — I359 Nonrheumatic aortic valve disorder, unspecified: Secondary | ICD-10-CM

## 2012-08-29 DIAGNOSIS — I369 Nonrheumatic tricuspid valve disorder, unspecified: Secondary | ICD-10-CM | POA: Diagnosis not present

## 2012-08-29 DIAGNOSIS — I379 Nonrheumatic pulmonary valve disorder, unspecified: Secondary | ICD-10-CM | POA: Insufficient documentation

## 2012-08-29 DIAGNOSIS — I351 Nonrheumatic aortic (valve) insufficiency: Secondary | ICD-10-CM

## 2012-08-29 LAB — CBC WITH DIFFERENTIAL/PLATELET
Basophils Absolute: 0 10*3/uL (ref 0.0–0.1)
Basophils Relative: 0.7 % (ref 0.0–3.0)
Eosinophils Absolute: 0.3 10*3/uL (ref 0.0–0.7)
Eosinophils Relative: 6.1 % — ABNORMAL HIGH (ref 0.0–5.0)
HCT: 39.7 % (ref 36.0–46.0)
Hemoglobin: 13 g/dL (ref 12.0–15.0)
Lymphocytes Relative: 31.1 % (ref 12.0–46.0)
Lymphs Abs: 1.4 10*3/uL (ref 0.7–4.0)
MCHC: 32.7 g/dL (ref 30.0–36.0)
MCV: 89.6 fl (ref 78.0–100.0)
Monocytes Absolute: 0.4 10*3/uL (ref 0.1–1.0)
Monocytes Relative: 7.6 % (ref 3.0–12.0)
Neutro Abs: 2.5 10*3/uL (ref 1.4–7.7)
Neutrophils Relative %: 54.5 % (ref 43.0–77.0)
Platelets: 294 10*3/uL (ref 150.0–400.0)
RBC: 4.43 Mil/uL (ref 3.87–5.11)
RDW: 14 % (ref 11.5–14.6)
WBC: 4.6 10*3/uL (ref 4.5–10.5)

## 2012-08-29 LAB — BASIC METABOLIC PANEL
BUN: 25 mg/dL — ABNORMAL HIGH (ref 6–23)
CO2: 29 mEq/L (ref 19–32)
Calcium: 9.2 mg/dL (ref 8.4–10.5)
Chloride: 100 mEq/L (ref 96–112)
Creatinine, Ser: 1.8 mg/dL — ABNORMAL HIGH (ref 0.4–1.2)
GFR: 38.05 mL/min — ABNORMAL LOW (ref >60.00)
Glucose, Bld: 93 mg/dL (ref 70–99)
Potassium: 3.9 mEq/L (ref 3.5–5.1)
Sodium: 138 mEq/L (ref 135–145)

## 2012-08-29 NOTE — Progress Notes (Signed)
HPI:  Patient is in for followup today. Overall, she seems to be getting along pretty well. She denies any chest pain. She denies progressive shortness of breath she and I reviewed her echocardiogram in detail today. It does demonstrate aortic regurgitation, and am waiting for the official report to determine the relationship between the previous study of the current study. We also reviewed her current medications today, and the list is rather extensive and has been changed.  Her chest pain comes and goes, but is not different than before.    Current Outpatient Prescriptions  Medication Sig Dispense Refill  . ALPRAZolam (XANAX) 0.5 MG tablet Take 0.5 mg by mouth at bedtime as needed.      Marland Kitchen amLODipine-valsartan (EXFORGE) 10-160 MG per tablet Take 1 tablet by mouth daily.        Marland Kitchen aspirin 81 MG tablet Take 81 mg by mouth daily.        . celecoxib (CELEBREX) 200 MG capsule Take 200 mg by mouth daily.        . citalopram (CELEXA) 10 MG tablet Take 10 mg by mouth daily.      . cloNIDine (CATAPRES) 0.3 MG tablet Take 0.3 mg by mouth 2 (two) times daily.       . cyanocobalamin 100 MCG tablet Take 100 mcg by mouth daily.       Marland Kitchen darifenacin (ENABLEX) 7.5 MG 24 hr tablet Take 7.5 mg by mouth daily.        Marland Kitchen doxazosin (CARDURA) 8 MG tablet Take 8 mg by mouth at bedtime.        Marland Kitchen ezetimibe (ZETIA) 10 MG tablet Take 10 mg by mouth daily.        Marland Kitchen lisinopril (PRINIVIL,ZESTRIL) 10 MG tablet Take 10 mg by mouth daily.        Marland Kitchen loratadine (CLARITIN) 10 MG tablet Take 1 tablet (10 mg total) by mouth daily.  10 tablet  0  . meloxicam (MOBIC) 15 MG tablet Take 15 mg by mouth daily.        . Multiple Vitamin (MULTIVITAMIN) tablet Take 1 tablet by mouth daily.        Marland Kitchen omeprazole (PRILOSEC) 20 MG capsule Take 20 mg by mouth 2 (two) times daily.        . verapamil (CALAN) 80 MG tablet Take 80 mg by mouth 2 (two) times daily.          No Known Allergies  Past Medical History  Diagnosis Date  . Hypertension     . Hyperlipidemia   . Cerebrovascular accident     hx of  . Chest pain   . Obesity   . Peptic ulcer disease   . Hx of hysterectomy     Past Surgical History  Procedure Date  . Tubal ligation   . Abdominal hysterectomy     Family History  Problem Relation Age of Onset  . Hypertension Mother     deceased  . Peripheral vascular disease Mother   . Cirrhosis Father     DICEASED  . Heart attack Other     grandmother deceased    History   Social History  . Marital Status: Married    Spouse Name: N/A    Number of Children: N/A  . 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 Smoking status: Never  Smoker   . Smokeless tobacco: Not on file  . Alcohol Use: No  . Drug Use: No  . Sexually Active: Not on file   Other Topics Concern  . Not on file   Social History Narrative  . No narrative on file    ROS: Please see the HPI.  All other systems reviewed and negative.  PHYSICAL EXAM:  BP 96/64  Pulse 62  Resp 18  Ht 5\' 7"  (1.702 m)  Wt 184 lb (83.462 kg)  BMI 28.82 kg/m2  SpO2 96% BP was confirmed by me.    General: Well developed, well nourished, in no acute distress. Head:  Normocephalic and atraumatic. Neck: no JVD Lungs: Clear to auscultation and percussion. Heart: Normal S1 and S2.  Soft AI murmur.  No SEM.   Abdomen:  Normal bowel sounds; soft; non tender; no organomegaly Pulses: Pulses normal in all 4 extremities. Extremities: No clubbing or cyanosis. No edema. Neurologic: Alert and oriented x 3.  EKG:  SB.  LVH   ASSESSMENT AND PLAN:

## 2012-08-29 NOTE — Assessment & Plan Note (Addendum)
I am concerned about her current BP.  It is lower and she is on a lot of meds.  We will stop her Verapamil, check some labs, and get her in later this week or early next week for an extender visit.   She will bring her meds to that clinic visit.  She is also on an ACE and ARB.  Will need to check.

## 2012-08-29 NOTE — Patient Instructions (Addendum)
Your physician recommends that you have lab work today: CBC and BMP  Your physician recommends that you schedule a follow-up appointment on Friday with NP/PA in the office to follow-up on BP.  Please bring all medications to your appointment.   Your physician recommends that you schedule a follow-up appointment in: 3 WEEKS with Dr Riley Kill  Your physician has recommended you make the following change in your medication: STOP Verapamil  Heart Murmur A heart murmur is an extra sound heard by your caregiver when listening to your heart with a device called a stethoscope. The sound might be a "hum" or "whoosh" sound heard when the heart beats. The sound comes from turbulence when blood flows through the heart. There are two types of heart murmurs:  Innocent (Harmless) murmurs: Most people with this type of heart murmur do not have signs or symptoms of heart problems. Many children have innocent heart murmurs. When an innocent heart murmur is found, there is no need to get tests or do treatment. Also, there is no need to restrict activities or stop playing sports. Innocent heart murmurs may be caused by many things. For example, it might be caused by a tiny hole or defect in the wall of the heart. These defects often close as a child grows. An innocent heart murmur may be heard by an examining clinician throughout your life. If you see a new caregiver, please let him or her know this was found during past exams.  Abnormal murmurs: May have signs and symptoms of heart problems. These types of murmurs can occur in children and adults. In children, abnormal heart murmurs are typically caused from heart defects that are present at birth. In adults, abnormal murmurs are usually from heart valve problems caused by disease, infection, or aging. SYMPTOMS   Innocent (Harmless) murmurs do not cause symptoms or require you to limit physical activity.  Many people with abnormal murmurs may or may not have symptoms.  If symptoms do develop, they might include:  Shortness of breath.  Blue coloring of the skin, especially on the fingertips.  Chest pain.  Palpitations or feeling a "fluttering" or a "skipped" heart beat.  Fainting.  Persistent cough.  Getting tired much faster than expected. DIAGNOSIS  A heart murmur might be heard during a pre-sports physical or during any type of examination. When a murmur is heard, it may suggest a possible problem. When this happens, your caregiver may ask you to see a heart specialist (cardiologist). You may also be asked to undergo one or more heart tests. In these cases, testing may vary depending upon what your caregiver heard. Tests for a heart murmur might include one or more of the following:  EKG (electrocardiogram).  Echocardiogram.  Cardiac MRI. For children and adults who have an abnormal heart murmur and want to play sports, it is important to complete testing, review test results, and receive recommendations from your caregiver. If heart disease is present, it may be risky to play. Finding out the results of your test Not all test results are available during your visit. If your test results are not back during the visit, make an appointment with your caregiver to find out the results. Do not assume everything is normal if you have not heard from your caregiver or the medical facility. It is important for you to follow up on all of your test results.  TREATMENT  As noted above, innocent (harmless) murmurs require no treatment or activity restriction. If the murmur represents  a problem with the heart, treatment will depend upon the exact nature of the problem. In these cases, medicine or surgery may be needed to treat the problem. HOME CARE INSTRUCTIONS If you want to participate in sports or other types of strenuous physical activity, it is important to discuss this first with your caregiver. If the murmur represents a problem with the heart and you  choose to participate in sports, there is a small chance that a serious problem (including sudden death) could result.  SEEK MEDICAL CARE IF:   You feel that your symptoms are slowly worsening.  You develop any new symptoms that cause concern.  You feel that you are having side effects from any medicines prescribed. SEEK IMMEDIATE MEDICAL CARE IF:   Chest pain develops.  You are short of breath.  You notice that your heart beats irregularly often enough to cause you to worry.  You have fainting spells.  There is a worsening of any problems that brought you or your child in for medical care. Document Released: 12/23/2004 Document Revised: 02/07/2012 Document Reviewed: 01/23/2008 [facility] Patient Information 2013 [place], [place].

## 2012-08-29 NOTE — Assessment & Plan Note (Signed)
Will await official report.  Has fairly minimal murmur at present.

## 2012-08-29 NOTE — Progress Notes (Signed)
Echocardiogram performed.  

## 2012-09-01 ENCOUNTER — Encounter: Payer: Self-pay | Admitting: Nurse Practitioner

## 2012-09-01 ENCOUNTER — Ambulatory Visit (INDEPENDENT_AMBULATORY_CARE_PROVIDER_SITE_OTHER): Payer: Medicare Other | Admitting: Nurse Practitioner

## 2012-09-01 ENCOUNTER — Ambulatory Visit: Payer: Medicare Other | Admitting: Cardiology

## 2012-09-01 VITALS — BP 106/70 | HR 88 | Ht 67.0 in | Wt 188.4 lb

## 2012-09-01 DIAGNOSIS — I10 Essential (primary) hypertension: Secondary | ICD-10-CM | POA: Diagnosis not present

## 2012-09-01 NOTE — Patient Instructions (Addendum)
We are stopping the Lisinopril and the Celebrex  For pain - use some Tylenol arthritis (or store brand)  Monitor your blood pressure at home and bring in your readings for Dr. Riley Kill to see.  Call the [facility] office at [phone] if you have any questions, problems or concerns.

## 2012-09-01 NOTE — Progress Notes (Signed)
Chelsea [name] Date of Birth: [birth year] Medical Record #[id number]  History of Present Illness: Chelsea [name] is seen today for a 3 day check. She is seen for Chelsea [name]. She has a history of mild valvular heart disease with recent echo. EF is normal. Her other problems include HTN, prior stroke, HLD, and OA.  She was here earlier this week. Blood pressure was much lower. She is on a multitude of meds. On both ACE & ARB. On amlodipiine and her Verapamil was stopped earlier this week.   She comes in today. She is here alone. She is doing ok. Blood pressure still on the low side of normal. She says her hemogobin was low, but it was normal by recent labs. Her echo showed just mild aortic regurgitation. EF is normal. She is not lightheaded or dizzy. She did have some elevated BUN and creatinine. She is on Celebrex and sounds like she has been on Mobic as well. She does not have chest pain. She is using her cane. Still with some mild left sided weakness from her old stroke.   Current Outpatient Prescriptions on File Prior to Visit  Medication Sig Dispense Refill  . amLODipine-valsartan (EXFORGE) 10-160 MG per tablet Take 1 tablet by mouth daily.        Marland Kitchen aspirin 81 MG tablet Take 81 mg by mouth daily.        . citalopram (CELEXA) 10 MG tablet Take 10 mg by mouth daily.      . cloNIDine (CATAPRES) 0.3 MG tablet Take 0.3 mg by mouth 2 (two) times daily.       . cyanocobalamin 100 MCG tablet Take 100 mcg by mouth daily.       Marland Kitchen doxazosin (CARDURA) 8 MG tablet Take 8 mg by mouth at bedtime.        Marland Kitchen ezetimibe (ZETIA) 10 MG tablet Take 10 mg by mouth daily.        . fesoterodine (TOVIAZ) 4 MG TB24 Take 1 tablet (4 mg total) by mouth daily.  1 tablet  0  . Multiple Vitamin (MULTIVITAMIN) tablet Take 1 tablet by mouth daily.        Marland Kitchen omeprazole (PRILOSEC) 20 MG capsule Take 20 mg by mouth 2 (two) times daily.          No Known Allergies  Past Medical History  Diagnosis Date  . Hypertension   .  Hyperlipidemia   . 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 Chest pain   . Obesity   . Peptic ulcer disease   . Hx of hysterectomy   . Aortic regurgitation     Past Surgical History  Procedure Date  . Tubal ligation   . Abdominal hysterectomy     History  Smoking status  . Never Smoker   Smokeless tobacco  . Not on file    History  Alcohol Use No    Family History  Problem Relation Age of Onset  . Hypertension Mother     deceased  . Peripheral vascular disease Mother   . Cirrhosis Father     DICEASED  . Heart attack Other     grandmother deceased    Review of Systems: The review of systems is per the HPI.  All other systems were reviewed and are negative.  Physical Exam: BP 106/70  Pulse 88  Ht 5\' 7"  (1.702 m)  Wt 188 lb 6.4 oz (85.458 kg)  BMI 29.51 kg/m2 Patient is very  pleasant and in no acute distress. Skin is warm and dry. Color is normal.  HEENT is unremarkable. Normocephalic/atraumatic. PERRL. Sclera are nonicteric. Neck is supple. No masses. No JVD. Lungs are clear. Cardiac exam shows a regular rate and rhythm. Soft outflow murmur. Abdomen is soft. Extremities are without edema. Gait and ROM are intact. No gross neurologic deficits noted.   LABORATORY DATA: N/A    Chemistry      Component Value Date/Time   NA 138 08/29/2012 1232   K 3.9 08/29/2012 1232   CL 100 08/29/2012 1232   CO2 29 08/29/2012 1232   BUN 25* 08/29/2012 1232   CREATININE 1.8* 08/29/2012 1232      Component Value Date/Time   CALCIUM 9.2 08/29/2012 1232   ALKPHOS 74 12/05/2009 1720   AST 16 12/05/2009 1720   ALT 15 12/05/2009 1720   BILITOT 0.6 12/05/2009 1720     Lab Results  Component Value Date   WBC 4.6 08/29/2012   HGB 13.0 08/29/2012   HCT 39.7 08/29/2012   MCV 89.6 08/29/2012   PLT 294.0 08/29/2012   Lab Results  Component Value Date   CHOL  Value: 163        ATP III CLASSIFICATION:  <200     mg/dL   Desirable  161-096  mg/dL   Borderline High  >=045    mg/dL    High        4/0/9811   HDL 48 12/05/2009   LDLCALC  Value: 91        Total Cholesterol/HDL:CHD Risk Coronary Heart Disease Risk Table                     Men   Women  1/2 Average Risk   3.4   3.3  Average Risk       5.0   4.4  2 X Average Risk   9.6   7.1  3 X Average Risk  23.4   11.0        Use the calculated Patient Ratio above and the CHD Risk Table to determine the patient's CHD Risk.        ATP III CLASSIFICATION (LDL):  <100     mg/dL   Optimal  914-782  mg/dL   Near or Above                    Optimal  130-159  mg/dL   Borderline  956-213  mg/dL   High  >086     mg/dL   Very High 04/04/8468   TRIG 118 12/05/2009   CHOLHDL 3.4 12/05/2009   Echo Study Conclusions 08-29-12  - Left ventricle: The cavity size was normal. Wall thickness was increased in a pattern of mild LVH. The estimated ejection fraction was 55%. Wall motion was normal; there were no regional wall motion abnormalities. Doppler parameters are consistent with abnormal left ventricular relaxation (grade 1 diastolic dysfunction). - Aortic valve: There was no stenosis. Mild regurgitation. - Mitral valve: Trivial regurgitation. - Left atrium: The atrium was mildly dilated. - Right ventricle: The cavity size was normal. Systolic function was normal. - Right atrium: The atrium was mildly dilated. - Tricuspid valve: Moderate regurgitation. Peak RV-RA gradient: 33mm Hg (S). - Pulmonary arteries: PA peak pressure: 38mm Hg (S). - Inferior vena cava: The vessel was normal in size; the respirophasic diameter changes were in the normal range (= 50%); findings are consistent with normal central venous pressure.  Assessment / Plan: 1. HTN - blood pressure remains on the lower side of normal. She is not symptomatic. She did have some mild kidney dysfunction on recent labs. I am stopping the Lisinopril and the Celebrex. She will need a recheck of her BMET with Chelsea [name] later this month. I have asked her to monitor some readings at home  and bring in for Chelsea [name] to review.   2. Valvular heart disease - mild. Has normal EF.  3. OA - taking Celebrex. BUN/Creatinine was up. I have suggested stopping and using Tylenol instead.  Chelsea [name] will see her back later this month. Will need recheck of her BMET on return. Patient is agreeable to this plan and will call if any problems develop in the interim.

## 2012-09-07 ENCOUNTER — Other Ambulatory Visit (HOSPITAL_COMMUNITY): Payer: Self-pay | Admitting: Family Medicine

## 2012-09-07 DIAGNOSIS — Z1231 Encounter for screening mammogram for malignant neoplasm of breast: Secondary | ICD-10-CM

## 2012-09-19 ENCOUNTER — Ambulatory Visit (INDEPENDENT_AMBULATORY_CARE_PROVIDER_SITE_OTHER): Payer: Medicare Other | Admitting: Cardiology

## 2012-09-19 ENCOUNTER — Ambulatory Visit: Payer: Medicare Other | Admitting: Cardiology

## 2012-09-19 ENCOUNTER — Encounter: Payer: Self-pay | Admitting: Cardiology

## 2012-09-19 VITALS — BP 116/70 | HR 75 | Ht 67.0 in | Wt 188.4 lb

## 2012-09-19 DIAGNOSIS — I10 Essential (primary) hypertension: Secondary | ICD-10-CM | POA: Diagnosis not present

## 2012-09-19 DIAGNOSIS — I359 Nonrheumatic aortic valve disorder, unspecified: Secondary | ICD-10-CM

## 2012-09-19 LAB — BASIC METABOLIC PANEL
BUN: 19 mg/dL (ref 6–23)
CO2: 31 mEq/L (ref 19–32)
Calcium: 9 mg/dL (ref 8.4–10.5)
Chloride: 105 mEq/L (ref 96–112)
Creatinine, Ser: 1.1 mg/dL (ref 0.4–1.2)
GFR: 65 mL/min (ref >60.00)
Glucose, Bld: 76 mg/dL (ref 70–99)
Potassium: 4 mEq/L (ref 3.5–5.1)
Sodium: 142 mEq/L (ref 135–145)

## 2012-09-19 NOTE — Assessment & Plan Note (Signed)
Seems better controlled. Her blood pressure is up. She is now on clonidine. We will recheck her basic metabolic profile today and hopefully this will be improved. I plan to see her back in followup in 4 weeks and have encouraged her to continue to check blood pressures at home

## 2012-09-19 NOTE — Patient Instructions (Addendum)
Your physician recommends that you schedule a follow-up appointment in: 4 WEEKS with Dr Virl Diamond MEDICATION BOTTLES TO YOUR NEXT OFFICE VISIT  Your physician recommends that you have lab work today: BMP  Calorie Counting Diet A calorie counting diet requires you to eat the number of calories that are right for you in a day. Calories are the measurement of how much energy you get from the food you eat. Eating the right amount of calories is important for staying at a healthy weight. If you eat too many calories, your body will store them as fat and you may gain weight. If you eat too few calories, you may lose weight. Counting the number of calories you eat during a day will help you know if you are eating the right amount. A Registered Dietitian can determine how many calories you need in a day. The amount of calories needed varies from person to person. If your goal is to lose weight, you will need to eat fewer calories. Losing weight can benefit you if you are overweight or have health problems such as heart disease, high blood pressure, or diabetes. If your goal is to gain weight, you will need to eat more calories. Gaining weight may be necessary if you have a certain health problem that causes your body to need more energy. TIPS Whether you are increasing or decreasing the number of calories you eat during a day, it may be hard to get used to changes in what you eat and drink. The following are tips to help you keep track of the number of calories you eat.  Measure foods at home with measuring cups. This helps you know the amount of food and number of calories you are eating.  Restaurants often serve food in amounts that are larger than 1 serving. While eating out, estimate how many servings of a food you are given. For example, a serving of cooked rice is  cup or about the size of half of a fist. Knowing serving sizes will help you be aware of how much food you are eating at  restaurants.  Ask for smaller portion sizes or child-size portions at restaurants.  Plan to eat half of a meal at a restaurant. Take the rest home or share the other half with a friend.  Read the Nutrition Facts panel on food labels for calorie content and serving size. You can find out how many servings are in a package, the size of a serving, and the number of calories each serving has.  For example, a package might contain 3 cookies. The Nutrition Facts panel on that package says that 1 serving is 1 cookie. Below that, it will say there are 3 servings in the container. The calories section of the Nutrition Facts label says there are 90 calories. This means there are 90 calories in 1 cookie (1 serving). If you eat 1 cookie you have eaten 90 calories. If you eat all 3 cookies, you have eaten 270 calories (3 servings x 90 calories = 270 calories). The list below tells you how big or small some common portion sizes are.  1 oz.........4 stacked dice.  3 oz........Marland KitchenDeck of cards.  1 tsp.......Marland KitchenTip of little finger.  1 tbs......Marland KitchenMarland KitchenThumb.  2 tbs.......Marland KitchenGolf ball.   cup......Marland KitchenHalf of a fist.  1 cup.......Marland KitchenA fist. KEEP A FOOD LOG Write down every food item you eat, the amount you eat, and the number of calories in each food you eat during the day. At the  end of the day, you can add up the total number of calories you have eaten. It may help to keep a list like the one below. Find out the calorie information by reading the Nutrition Facts panel on food labels. Breakfast  Bran cereal (1 cup, 110 calories).  Fat-free milk ( cup, 45 calories). Snack  Apple (1 medium, 80 calories). Lunch  Spinach (1 cup, 20 calories).  Tomato ( medium, 20 calories).  Chicken breast strips (3 oz, 165 calories).  Shredded cheddar cheese ( cup, 110 calories).  Light [place] dressing (2 tbs, 60 calories).  Whole-wheat bread (1 slice, 80 calories).  Tub margarine (1 tsp, 35 calories).  Vegetable  soup (1 cup, 160 calories). Dinner  Pork chop (3 oz, 190 calories).  Audi Conover rice (1 cup, 215 calories).  Steamed broccoli ( cup, 20 calories).  Strawberries (1  cup, 65 calories).  Whipped cream (1 tbs, 50 calories). Daily Calorie Total: 1425 Document Released: 11/15/2005 Document Revised: 02/07/2012 Document Reviewed: 05/12/2007 [facility] Patient Information 2013 [place], [place].

## 2012-09-19 NOTE — Assessment & Plan Note (Signed)
This is stable at the present time

## 2012-09-19 NOTE — Progress Notes (Signed)
HPI:  Patient seen today in a followup visit. She actually is been pretty stable. She forgot again to bring her medications in. I've been on her last several visits to bring her medications for her office visits, but she continues to forget. We will reminded her again today. However, she is on less medication at the present time. Her blood pressures have been improved. His blood pressure cuff at home, and his systolics are generally been running in the range of 130. She feels really quite good. She denies any chest pain. We asked her come in earlier for any electrolyte panel, but she was unable to come. We will obtain this today.  Current Outpatient Prescriptions  Medication Sig Dispense Refill  . amLODipine-valsartan (EXFORGE) 10-160 MG per tablet Take 1 tablet by mouth daily.        Marland Kitchen aspirin 81 MG tablet Take 81 mg by mouth daily.        . citalopram (CELEXA) 10 MG tablet Take 10 mg by mouth daily.      . cloNIDine (CATAPRES) 0.3 MG tablet Take 0.3 mg by mouth 2 (two) times daily.       . cyanocobalamin 100 MCG tablet Take 100 mcg by mouth daily.       Marland Kitchen doxazosin (CARDURA) 8 MG tablet Take 8 mg by mouth at bedtime.        Marland Kitchen ezetimibe (ZETIA) 10 MG tablet Take 10 mg by mouth daily.        . fesoterodine (TOVIAZ) 4 MG TB24 Take 1 tablet (4 mg total) by mouth daily.  1 tablet  0  . Multiple Vitamin (MULTIVITAMIN) tablet Take 1 tablet by mouth daily.        Marland Kitchen omeprazole (PRILOSEC) 20 MG capsule Take 20 mg by mouth 2 (two) times daily.          No Known Allergies  Past Medical History  Diagnosis Date  . Hypertension   . Hyperlipidemia   . 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 Chest pain   . Obesity   . Peptic ulcer disease   . Hx of hysterectomy   . Aortic regurgitation     Past Surgical History  Procedure Date  . Tubal ligation   . Abdominal hysterectomy     Family History  Problem Relation Age of Onset  . Hypertension Mother     deceased  . Peripheral  vascular disease Mother   . Cirrhosis Father     DICEASED  . Heart attack Other     grandmother deceased    History   Social History  . Marital Status: Married    Spouse Name: N/A    Number of Children: N/A  . 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 Smoking status: Never Smoker   . Smokeless tobacco: Not on file  . Alcohol Use: No  . Drug Use: No  . Sexually Active: Not Currently   Other Topics Concern  . Not on file   Social History Narrative  . No narrative on file    ROS: Please see the HPI.  All other systems reviewed and negative.  PHYSICAL EXAM:  BP 116/70  Pulse 75  Ht 5\' 7"  (1.702 m)  Wt 188 lb 6.4 oz (85.458 kg)  BMI 29.51 kg/m2  SpO2 97%  General: Well  developed, well nourished, in no acute distress. Head:  Normocephalic and atraumatic. Neck: no JVD Lungs: Clear to auscultation and percussion. Heart: Normal S1 and S2.  Soft SEM.  No DM.    Pulses: Pulses normal in all 4 extremities. Extremities: No clubbing or cyanosis. No edema. Neurologic: Alert and oriented x 3.  EKG:  Charge pulled out.  No acute change.  LVH.    ASSESSMENT AND PLAN:

## 2012-10-17 ENCOUNTER — Ambulatory Visit (INDEPENDENT_AMBULATORY_CARE_PROVIDER_SITE_OTHER): Payer: Medicare Other | Admitting: Cardiology

## 2012-10-17 VITALS — BP 136/84 | HR 78 | Resp 16 | Ht 67.0 in | Wt 183.0 lb

## 2012-10-17 DIAGNOSIS — I359 Nonrheumatic aortic valve disorder, unspecified: Secondary | ICD-10-CM | POA: Diagnosis not present

## 2012-10-17 DIAGNOSIS — I10 Essential (primary) hypertension: Secondary | ICD-10-CM | POA: Diagnosis not present

## 2012-10-17 LAB — BASIC METABOLIC PANEL
BUN: 15 mg/dL (ref 6–23)
CO2: 32 mEq/L (ref 19–32)
Calcium: 9.2 mg/dL (ref 8.4–10.5)
Chloride: 100 mEq/L (ref 96–112)
Creatinine, Ser: 1.1 mg/dL (ref 0.4–1.2)
GFR: 67.83 mL/min (ref >60.00)
Glucose, Bld: 95 mg/dL (ref 70–99)
Potassium: 3.7 mEq/L (ref 3.5–5.1)
Sodium: 139 mEq/L (ref 135–145)

## 2012-10-17 NOTE — Assessment & Plan Note (Addendum)
She remains stable and her BP is improved.  No changes at present.  Will check BMET today.  See last echocardiogram.

## 2012-10-17 NOTE — Assessment & Plan Note (Signed)
Improved on multiple meds.  Will continue to monitor.  BMET today and follow up in 2 months in office.

## 2012-10-17 NOTE — Progress Notes (Signed)
HPI:  Patient is in for followup. From a clinical standpoint she really is doing quite well. She does have a headache, but otherwise is stable. She did bring her medicines today, so we were able to review them in detail. She remains on quite a few blood pressure medicines to keep her blood pressure under control, but clearly is improved.  Current Outpatient Prescriptions  Medication Sig Dispense Refill  . amLODipine-valsartan (EXFORGE) 10-160 MG per tablet Take 1 tablet by mouth daily.        Marland Kitchen aspirin 81 MG tablet Take 81 mg by mouth daily.        . citalopram (CELEXA) 10 MG tablet Take 10 mg by mouth daily.      . cloNIDine (CATAPRES) 0.3 MG tablet Take 0.3 mg by mouth 2 (two) times daily.       . cyanocobalamin 100 MCG tablet Take 100 mcg by mouth daily.       Marland Kitchen doxazosin (CARDURA) 8 MG tablet Take 8 mg by mouth at bedtime.        Marland Kitchen ezetimibe (ZETIA) 10 MG tablet Take 10 mg by mouth daily.        . fesoterodine (TOVIAZ) 4 MG TB24 Take 1 tablet (4 mg total) by mouth daily.  1 tablet  0  . Multiple Vitamin (MULTIVITAMIN) tablet Take 1 tablet by mouth daily.        Marland Kitchen omeprazole (PRILOSEC) 20 MG capsule Take 20 mg by mouth 2 (two) times daily.          No Known Allergies  Past Medical History  Diagnosis Date  . Hypertension   . Hyperlipidemia   . 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 Chest pain   . Obesity   . Peptic ulcer disease   . Hx of hysterectomy   . Aortic regurgitation     Past Surgical History  Procedure Date  . Tubal ligation   . Abdominal hysterectomy     Family History  Problem Relation Age of Onset  . Hypertension Mother     deceased  . Peripheral vascular disease Mother   . Cirrhosis Father     DICEASED  . Heart attack Other     grandmother deceased    History   Social History  . Marital Status: Married    Spouse Name: N/A    Number of Children: N/A  . 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 Smoking status: Never Smoker   . Smokeless tobacco: Not on file  . Alcohol Use: No  . Drug Use: No  . Sexually Active: Not Currently   Other Topics Concern  . Not on file   Social History Narrative  . No narrative on file    ROS: Please see the HPI.  All other systems reviewed and negative.  PHYSICAL EXAM:  BP 136/84  Pulse 78  Resp 16  Ht 5\' 7"  (1.702 m)  Wt 183 lb (83.008 kg)  BMI 28.66 kg/m2  General: Well developed, well nourished, in no acute distress. Head:  Normocephalic and atraumatic. Neck: no JVD Lungs: Clear to auscultation and percussion. Heart: Normal S1 and S2. 1-2/6 SEM.  Do not appreciate AI today.   Pulses: Pulses normal in all 4 extremities. Extremities: No clubbing or cyanosis. No edema. Neurologic: Alert and oriented x  3.  EKG:  Non done today.   ASSESSMENT AND PLAN:

## 2012-10-17 NOTE — Patient Instructions (Signed)
Your physician recommends that you have lab work today: [facility]  Your physician recommends that you schedule a follow-up appointment in: 6-8 WEEKS  Your physician recommends that you continue on your current medications as directed. Please refer to the Current Medication list given to you today.

## 2012-10-19 ENCOUNTER — Encounter: Payer: Self-pay | Admitting: Cardiology

## 2012-10-19 NOTE — Telephone Encounter (Signed)
This encounter was created in error - please disregard.

## 2012-10-19 NOTE — Telephone Encounter (Signed)
New Problem:    Patient returned your call about her blood work.  Please call back.

## 2012-11-13 ENCOUNTER — Ambulatory Visit (HOSPITAL_COMMUNITY)
Admission: RE | Admit: 2012-11-13 | Discharge: 2012-11-13 | Disposition: A | Discharge status: Home / Self Care | Payer: Medicare HMO | Source: Ambulatory Visit | Attending: Family Medicine | Admitting: Family Medicine

## 2012-11-13 DIAGNOSIS — Z1231 Encounter for screening mammogram for malignant neoplasm of breast: Secondary | ICD-10-CM | POA: Insufficient documentation

## 2012-12-05 ENCOUNTER — Encounter: Payer: Self-pay | Admitting: Cardiology

## 2012-12-05 ENCOUNTER — Ambulatory Visit (INDEPENDENT_AMBULATORY_CARE_PROVIDER_SITE_OTHER): Payer: Medicare HMO | Admitting: Cardiology

## 2012-12-05 VITALS — BP 122/84 | HR 63 | Resp 16 | Ht 67.0 in | Wt 191.0 lb

## 2012-12-05 DIAGNOSIS — I10 Essential (primary) hypertension: Secondary | ICD-10-CM

## 2012-12-05 DIAGNOSIS — I359 Nonrheumatic aortic valve disorder, unspecified: Secondary | ICD-10-CM

## 2012-12-05 MED ORDER — DOXAZOSIN MESYLATE 8 MG PO TABS: 4.0000 mg | ORAL_TABLET | Freq: Every day | ORAL | Status: DC

## 2012-12-05 NOTE — Assessment & Plan Note (Signed)
She may be slightly overmedicated.  Will reduce Cardura to 4mg  and have her check and call nurses here for follow up.  She will then return in two weeks, and call us if she remains lightheaded.  Clonidine will need to be adjusted if that is necessary.

## 2012-12-05 NOTE — Patient Instructions (Addendum)
Reduce Cardura dose to 4mg  at bedtime. 1/2 tablet.  Your physician recommends that you schedule a follow-up appointment in: 2 weeks with Dr. Riley Kill.  Keep a good record of your blood pressures at home and report them back to Lauren in 1 week.

## 2012-12-05 NOTE — Progress Notes (Signed)
HPI:  She returns in follow up.  She is doing pretty well, but she notes that she gets lightheaded when she gets up quickly and turns. She take some of her morning medications. She denies any chest pain or other cardiac type symptoms. She did bump her head, and notices that it is sore.  This was just after Thanksgiving, but has improved.  Denies other symptoms.    Current Outpatient Prescriptions  Medication Sig Dispense Refill  . amLODipine-valsartan (EXFORGE) 10-160 MG per tablet Take 1 tablet by mouth daily.        Marland Kitchen aspirin 81 MG tablet Take 81 mg by mouth daily.        . citalopram (CELEXA) 10 MG tablet Take 10 mg by mouth daily.      . cloNIDine (CATAPRES) 0.3 MG tablet Take 0.3 mg by mouth 2 (two) times daily.       . cyanocobalamin 100 MCG tablet Take 100 mcg by mouth daily.       Marland Kitchen doxazosin (CARDURA) 8 MG tablet Take 0.5 tablets (4 mg total) by mouth at bedtime.  30 tablet  3  . ezetimibe (ZETIA) 10 MG tablet Take 10 mg by mouth daily.        . fesoterodine (TOVIAZ) 4 MG TB24 Take 1 tablet (4 mg total) by mouth daily.  1 tablet  0  . Multiple Vitamin (MULTIVITAMIN) tablet Take 1 tablet by mouth daily.        Marland Kitchen omeprazole (PRILOSEC) 20 MG capsule Take 20 mg by mouth 2 (two) times daily.          No Known Allergies  Past Medical History  Diagnosis Date  . Hypertension   . Hyperlipidemia   . 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 Chest pain   . Obesity   . Peptic ulcer disease   . Hx of hysterectomy   . Aortic regurgitation     Past Surgical History  Procedure Date  . Tubal ligation   . Abdominal hysterectomy     Family History  Problem Relation Age of Onset  . Hypertension Mother     deceased  . Peripheral vascular disease Mother   . Cirrhosis Father     DICEASED  . Heart attack Other     grandmother deceased    History   Social History  . Marital Status: Married    Spouse Name: N/A    Number of Children: N/A  . 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 Smoking status: Never Smoker   . Smokeless tobacco: Not on file  . Alcohol Use: No  . Drug Use: No  . Sexually Active: Not Currently   Other Topics Concern  . Not on file   Social History Narrative  . No narrative on file    ROS: Please see the HPI.  All other systems reviewed and negative.  PHYSICAL EXAM:  BP 122/84  Pulse 63  Resp 16  Ht 5\' 7"  (1.702 m)  Wt 191 lb (86.637 kg)  BMI 29.91 kg/m2  SpO2 98%  General: Well developed, well nourished, in no acute distress. Head:  Normocephalic and atraumatic. Neck: no JVD Lungs: Clear to auscultation and percussion. Heart: Normal S1 and S2.  No murmur, rubs or gallops.  Pulses: Pulses normal in all 4 extremities. Extremities:  No clubbing or cyanosis. No edema. Neurologic: Alert and oriented x 3.  No focal defects.    EKG:  ASSESSMENT AND PLAN:

## 2012-12-05 NOTE — Assessment & Plan Note (Signed)
No change in status.

## 2012-12-18 ENCOUNTER — Ambulatory Visit (INDEPENDENT_AMBULATORY_CARE_PROVIDER_SITE_OTHER): Payer: Medicare HMO | Admitting: Cardiology

## 2012-12-18 ENCOUNTER — Encounter: Payer: Self-pay | Admitting: Cardiology

## 2012-12-18 VITALS — BP 125/81 | HR 70 | Ht 67.0 in | Wt 187.0 lb

## 2012-12-18 DIAGNOSIS — I951 Orthostatic hypotension: Secondary | ICD-10-CM | POA: Insufficient documentation

## 2012-12-18 DIAGNOSIS — I10 Essential (primary) hypertension: Secondary | ICD-10-CM

## 2012-12-18 NOTE — Assessment & Plan Note (Addendum)
This has resolved with reduction in her Cardura.  We will continue the lower dose of medication. We will follow her up in six weeks.  TS

## 2012-12-18 NOTE — Progress Notes (Signed)
   HPI:  Patient returns in followup. She really is doing quite well. Since we read used her dose of Cardura, she is substantially better. She has no lightheadedness or dizziness. She feels quite well  Current Outpatient Prescriptions  Medication Sig Dispense Refill  . amLODipine-valsartan (EXFORGE) 10-160 MG per tablet Take 1 tablet by mouth daily.        Marland Kitchen aspirin 81 MG tablet Take 81 mg by mouth daily.        . citalopram (CELEXA) 10 MG tablet Take 10 mg by mouth daily.      . cloNIDine (CATAPRES) 0.3 MG tablet Take 0.3 mg by mouth 2 (two) times daily.       . cyanocobalamin 100 MCG tablet Take 100 mcg by mouth daily.       Marland Kitchen doxazosin (CARDURA) 8 MG tablet Take 0.5 tablets (4 mg total) by mouth at bedtime.  30 tablet  3  . ezetimibe (ZETIA) 10 MG tablet Take 10 mg by mouth daily.        . fesoterodine (TOVIAZ) 4 MG TB24 Take 1 tablet (4 mg total) by mouth daily.  1 tablet  0  . Multiple Vitamin (MULTIVITAMIN) tablet Take 1 tablet by mouth daily.        Marland Kitchen omeprazole (PRILOSEC) 20 MG capsule Take 20 mg by mouth 2 (two) times daily.          No Known Allergies  Past Medical History  Diagnosis Date  . Hypertension   . Hyperlipidemia   . 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 Chest pain   . Obesity   . Peptic ulcer disease   . Hx of hysterectomy   . Aortic regurgitation     Past Surgical History  Procedure Date  . Tubal ligation   . Abdominal hysterectomy     Family History  Problem Relation Age of Onset  . Hypertension Mother     deceased  . Peripheral vascular disease Mother   . Cirrhosis Father     DICEASED  . Heart attack Other     grandmother deceased    History   Social History  . Marital Status: Married    Spouse Name: N/A    Number of Children: N/A  . 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 Smoking status: Never Smoker     . Smokeless tobacco: Not on file  . Alcohol Use: No  . Drug Use: No  . Sexually Active: Not Currently   Other Topics Concern  . Not on file   Social History Narrative  . No narrative on file    ROS: Please see the HPI.  All other systems reviewed and negative.  PHYSICAL EXAM:  BP 125/81  Pulse 70  Ht 5\' 7"  (1.702 m)  Wt 187 lb (84.823 kg)  BMI 29.29 kg/m2  SpO2 98%  General: Well developed, well nourished, in no acute distress. Head:  Normocephalic and atraumatic. Neck: no JVD Neurologic: Alert and oriented x 3.  EKG:  ASSESSMENT AND PLAN:

## 2012-12-18 NOTE — Assessment & Plan Note (Signed)
Controlled at present.  

## 2012-12-18 NOTE — Patient Instructions (Signed)
Your physician recommends that you schedule a follow-up appointment in: 6 WEEKS with Dr Stuckey.  Your physician recommends that you continue on your current medications as directed. Please refer to the Current Medication list given to you today.  

## 2013-01-29 ENCOUNTER — Ambulatory Visit: Payer: Medicare HMO | Admitting: Cardiology

## 2013-01-30 ENCOUNTER — Encounter: Payer: Self-pay | Admitting: Cardiology

## 2013-01-30 ENCOUNTER — Ambulatory Visit (INDEPENDENT_AMBULATORY_CARE_PROVIDER_SITE_OTHER): Payer: Medicare HMO | Admitting: Cardiology

## 2013-01-30 VITALS — BP 142/90 | HR 70 | Ht 67.0 in | Wt 192.0 lb

## 2013-01-30 DIAGNOSIS — I359 Nonrheumatic aortic valve disorder, unspecified: Secondary | ICD-10-CM

## 2013-01-30 DIAGNOSIS — I2789 Other specified pulmonary heart diseases: Secondary | ICD-10-CM

## 2013-01-30 DIAGNOSIS — I951 Orthostatic hypotension: Secondary | ICD-10-CM

## 2013-01-30 DIAGNOSIS — I272 Pulmonary hypertension, unspecified: Secondary | ICD-10-CM

## 2013-01-30 DIAGNOSIS — I10 Essential (primary) hypertension: Secondary | ICD-10-CM

## 2013-01-30 NOTE — Assessment & Plan Note (Signed)
Improved.  Managed mostly by Dr. Parke Simmers.  Meds recently cut back due to hypotension.  Remains on considerable meds.

## 2013-01-30 NOTE — Progress Notes (Signed)
HPI:  This nice patient is in for followup. She cut her Cardura and half recently, and her blood pressures been much better. They been running in the range of about 120 systolic. She denies any ongoing chest pain or progressive symptoms. She feels good  Current Outpatient Prescriptions  Medication Sig Dispense Refill  . amLODipine-valsartan (EXFORGE) 10-160 MG per tablet Take 1 tablet by mouth daily.        Marland Kitchen aspirin 81 MG tablet Take 81 mg by mouth daily.        . citalopram (CELEXA) 10 MG tablet Take 10 mg by mouth daily.      . cloNIDine (CATAPRES) 0.3 MG tablet Take 0.3 mg by mouth 2 (two) times daily.       . cyanocobalamin 100 MCG tablet Take 100 mcg by mouth daily.       Marland Kitchen doxazosin (CARDURA) 8 MG tablet Take 0.5 tablets (4 mg total) by mouth at bedtime.  30 tablet  3  . ezetimibe (ZETIA) 10 MG tablet Take 10 mg by mouth daily.        . fesoterodine (TOVIAZ) 4 MG TB24 Take 1 tablet (4 mg total) by mouth daily.  1 tablet  0  . Multiple Vitamin (MULTIVITAMIN) tablet Take 1 tablet by mouth daily.        Marland Kitchen omeprazole (PRILOSEC) 20 MG capsule Take 20 mg by mouth 2 (two) times daily.         No current facility-administered medications for this visit.    No Known Allergies  Past Medical History  Diagnosis Date  . Hypertension   . Hyperlipidemia   . 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 Chest pain   . Obesity   . Peptic ulcer disease   . Hx of hysterectomy   . Aortic regurgitation     Past Surgical History  Procedure Laterality Date  . Tubal ligation    . Abdominal hysterectomy      Family History  Problem Relation Age of Onset  . Hypertension Mother     deceased  . Peripheral vascular disease Mother   . Cirrhosis Father     DICEASED  . Heart attack Other     grandmother deceased    History   Social History  . Marital Status: Married    Spouse Name: N/A    Number of Children: N/A  . 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 Smoking status: Never Smoker   . Smokeless tobacco: Not on file  . Alcohol Use: No  . Drug Use: No  . Sexually Active: Not Currently   Other Topics Concern  . Not on file   Social History Narrative  . No narrative on file    ROS: Please see the HPI.  All other systems reviewed and negative.  PHYSICAL EXAM:  BP 142/90  Pulse 70  Ht 5\' 7"  (1.702 m)  Wt 192 lb (87.091 kg)  BMI 30.06 kg/m2.  No orthostatic change.    General: Well developed, well nourished, in no acute distress. Head:  Normocephalic and atraumatic. Neck: no JVD Lungs: Clear to auscultation and percussion. Heart: Normal S1 and S2.  Minimal AI murmur.   Abdomen:  Normal bowel sounds; soft; non tender; no organomegaly Pulses: Pulses normal in all 4 extremities. Extremities: No clubbing or cyanosis.  No edema. Neurologic: Alert and oriented x 3.  EKG:  NSR.  WNL.    ASSESSMENT AND PLAN:

## 2013-01-30 NOTE — Patient Instructions (Addendum)
Your physician recommends that you schedule a follow-up appointment in: 2 months with Dr McLean.   

## 2013-01-30 NOTE — Assessment & Plan Note (Signed)
Not note today.

## 2013-01-30 NOTE — Assessment & Plan Note (Signed)
Very  Borderline.  On RV enlargement on echo.  Numbers are borderline.  Will need office follow up.  Might eventually benefit from TEE, but with findings prob not necessary at this point.

## 2013-01-30 NOTE — Assessment & Plan Note (Signed)
Mild possible mod AI.  BP control has improved on multiple meds.

## 2013-03-29 ENCOUNTER — Telehealth: Payer: Self-pay | Admitting: Cardiology

## 2013-03-29 NOTE — Telephone Encounter (Signed)
New Problem:    Patient called in confused about the last time she had an OV in our office.  Patient was contacted by her transportation's office and told to have a transportation slip from her vist from around 03/20/13 filled out and faxed to them Please call back.

## 2013-03-29 NOTE — Telephone Encounter (Signed)
Pt states that since she called this office this morning she realized that the office she was to call was [facility] for needed transportation slip. Pt is advised that if she does need a form from [place] to bring it by the office and we will gladly complete, she verbalized understanding.

## 2013-04-09 ENCOUNTER — Ambulatory Visit: Payer: Medicare HMO | Admitting: Cardiology

## 2013-04-10 ENCOUNTER — Encounter: Payer: Self-pay | Admitting: Cardiology

## 2013-04-10 ENCOUNTER — Ambulatory Visit (INDEPENDENT_AMBULATORY_CARE_PROVIDER_SITE_OTHER): Payer: Medicare HMO | Admitting: Cardiology

## 2013-04-10 VITALS — BP 110/70 | HR 76 | Ht 67.0 in | Wt 184.0 lb

## 2013-04-10 DIAGNOSIS — I10 Essential (primary) hypertension: Secondary | ICD-10-CM

## 2013-04-10 DIAGNOSIS — I359 Nonrheumatic aortic valve disorder, unspecified: Secondary | ICD-10-CM

## 2013-04-10 DIAGNOSIS — E785 Hyperlipidemia, unspecified: Secondary | ICD-10-CM

## 2013-04-10 MED ORDER — ATORVASTATIN CALCIUM 10 MG PO TABS: 10.0000 mg | ORAL_TABLET | Freq: Every day | ORAL | Status: DC

## 2013-04-10 NOTE — Progress Notes (Signed)
Patient ID: Chelsea [name], female   DOB: Jan 09, 1951, 62 y.o.   MRN: [id number] PCP: Dr. Parke Simmers  62 yo with history of CVA and left-sided weakness as well as a history of aortic insufficiency presents for cardiology followup.  She has been seen by Dr. Riley Kill in the past and is seen by me for the first time today.  She is feeling well in general.  No chest pain.  She uses her cane to walk and gets short of breath with long distances.  Her left side, especially the arm, is weak.  Her last echo showed mild aortic insufficiency.    Labs (11/13): K 3.7, creatinine 1.1  PMH: 1. HTN 2. Hyperlipidemia 3. CVA with left-sided weakness in 2007. 4. PUD 5. Aortic insufficiency:  Echo (10/13) showed EF 55%, mild LVH, mild AI, normal RV, moderate TR, PA systolic pressure 38 mmHg.   FH: Grandmother with MI, father with cirrhosis, mother with HTN  SH: Married, nonsmoker, lives in [place].  ROS: All systems reviewed and negative except as per PHI.   Current Outpatient Prescriptions  Medication Sig Dispense Refill  . amLODipine-valsartan (EXFORGE) 10-160 MG per tablet Take 1 tablet by mouth daily.        Marland Kitchen aspirin 81 MG tablet Take 81 mg by mouth daily.        . citalopram (CELEXA) 10 MG tablet Take 10 mg by mouth daily.      . cloNIDine (CATAPRES) 0.3 MG tablet Take 0.3 mg by mouth 2 (two) times daily.       . cyanocobalamin 100 MCG tablet Take 100 mcg by mouth daily.       Marland Kitchen doxazosin (CARDURA) 8 MG tablet Take 0.5 tablets (4 mg total) by mouth at bedtime.  30 tablet  3  . fesoterodine (TOVIAZ) 4 MG TB24 Take 1 tablet (4 mg total) by mouth daily.  1 tablet  0  . Multiple Vitamin (MULTIVITAMIN) tablet Take 1 tablet by mouth daily.        Marland Kitchen omeprazole (PRILOSEC) 20 MG capsule Take 20 mg by mouth 2 (two) times daily.        Marland Kitchen atorvastatin (LIPITOR) 10 MG tablet Take 1 tablet (10 mg total) by mouth daily.  30 tablet  11   No current facility-administered medications for this visit.    BP 110/70  Pulse  76  Ht 5\' 7"  (1.702 m)  Wt 184 lb (83.462 kg)  BMI 28.81 kg/m2  SpO2 99% General: NAD Neck: No JVD, no thyromegaly or thyroid nodule.  Lungs: Clear to auscultation bilaterally with normal respiratory effort. CV: Nondisplaced PMI.  Heart regular S1/S2, no S3/S4, no murmur.  No peripheral edema.  No carotid bruit.  Normal pedal pulses.  Abdomen: Soft, nontender, no hepatosplenomegaly, no distention.  Neurologic: Alert and oriented x 3.  Left facial droop and left-sided weakness.  Psych: Normal affect. Extremities: No clubbing or cyanosis.   Assessment/Plan: 1. Hyperlipidemia: With known vascular disease (prior CVA), patient should be on a statin.  She says she has never taken a statin.  She is on Zetia.  I will have her stop Zetia and start atorvastatin 10 mg daily with lipids/LFTs in 2 months.  2. HTN: BP appears to be controlled.  3. H/o CVA: Continue ASA 81, BP control, and start statin.  4. Aortic insufficiency: Only mild on most recent echo.  I will not have her get a repeat echo until 10/15 unless symptoms develop.    Marca Ancona 61/13/2014

## 2013-04-10 NOTE — Patient Instructions (Addendum)
Your physician wants you to follow-up in: 6 MONTHS  You will receive a reminder letter in the mail two months in advance. If you don't receive a letter, please call our office to schedule the follow-up appointment.   Your physician recommends that you return for a FASTING lipid profile: 2 MONTHS  06/13/13 Wednesday COME IN ANYTIME 7:30 AM TO 4 PM NO EATING BUT DRINK WATER.  Your physician has recommended you make the following change in your medication:   STOP ZETIA START ATORVASTATIN 10 MG EACH EVENING

## 2013-04-13 ENCOUNTER — Telehealth: Payer: Self-pay | Admitting: Cardiology

## 2013-04-13 ENCOUNTER — Telehealth: Payer: Self-pay | Admitting: *Deleted

## 2013-04-13 MED ORDER — ATORVASTATIN CALCIUM 10 MG PO TABS: 10.0000 mg | ORAL_TABLET | Freq: Every day | ORAL | Status: DC

## 2013-04-13 NOTE — Telephone Encounter (Signed)
LMTCB Calling patient to see which pharmacy she prefers for her medication refills. [facility] or [facility] on [address].   Micki Riley, CMA

## 2013-04-13 NOTE — Telephone Encounter (Signed)
[facility]

## 2013-04-16 ENCOUNTER — Telehealth: Payer: Self-pay | Admitting: Cardiology

## 2013-04-16 NOTE — Telephone Encounter (Signed)
Spoke with patient and answered questions.

## 2013-04-16 NOTE — Telephone Encounter (Signed)
New problem     Need an explantation of why patient was taken off zetia.     What are the cholesterol levels .   Recent office visit & lab sent to Dr. Arvella Nigh office # [phone].

## 2013-06-11 ENCOUNTER — Telehealth: Payer: Self-pay | Admitting: Cardiology

## 2013-06-11 NOTE — Telephone Encounter (Deleted)
error 

## 2013-06-13 ENCOUNTER — Other Ambulatory Visit (INDEPENDENT_AMBULATORY_CARE_PROVIDER_SITE_OTHER): Payer: Medicare HMO

## 2013-06-13 DIAGNOSIS — E785 Hyperlipidemia, unspecified: Secondary | ICD-10-CM

## 2013-06-13 LAB — HEPATIC FUNCTION PANEL
ALT: 17 U/L (ref 0–35)
AST: 19 U/L (ref 0–37)
Albumin: 3.6 g/dL (ref 3.5–5.2)
Alkaline Phosphatase: 93 U/L (ref 39–117)
Bilirubin, Direct: 0 mg/dL (ref 0.0–0.3)
Total Bilirubin: 0.3 mg/dL (ref 0.3–1.2)
Total Protein: 7.2 g/dL (ref 6.0–8.3)

## 2013-06-13 LAB — LIPID PANEL
Cholesterol: 102 mg/dL (ref 0–200)
HDL: 46.3 mg/dL (ref >39.00)
LDL Cholesterol: 43 mg/dL (ref 0–99)
Total CHOL/HDL Ratio: 2
Triglycerides: 66 mg/dL (ref 0.0–149.0)
VLDL: 13.2 mg/dL (ref 0.0–40.0)

## 2013-09-10 ENCOUNTER — Other Ambulatory Visit: Payer: Self-pay | Admitting: Cardiology

## 2013-09-17 ENCOUNTER — Telehealth: Payer: Self-pay | Admitting: Cardiology

## 2013-09-17 NOTE — Telephone Encounter (Signed)
Pt called to make an appointment to see Dr. Shirlee Latch for a 6 months F/U. An appointment was made for 11/15/13 at 3:30 PM pt aware.

## 2013-09-17 NOTE — Telephone Encounter (Signed)
New Problem:  Pt states she is calling in to find out what her plan of care is... Pt states is she suppose to see Dr. Shirlee Latch every 6 months, every year, etc... Please advise

## 2013-10-05 ENCOUNTER — Other Ambulatory Visit (HOSPITAL_COMMUNITY): Payer: Self-pay | Admitting: Family Medicine

## 2013-10-05 DIAGNOSIS — Z1231 Encounter for screening mammogram for malignant neoplasm of breast: Secondary | ICD-10-CM

## 2013-10-10 DIAGNOSIS — L219 Seborrheic dermatitis, unspecified: Secondary | ICD-10-CM | POA: Diagnosis not present

## 2013-11-11 ENCOUNTER — Other Ambulatory Visit: Payer: Self-pay | Admitting: Cardiology

## 2013-11-15 ENCOUNTER — Encounter: Payer: Self-pay | Admitting: *Deleted

## 2013-11-15 ENCOUNTER — Encounter: Payer: Self-pay | Admitting: Cardiology

## 2013-11-15 ENCOUNTER — Ambulatory Visit (INDEPENDENT_AMBULATORY_CARE_PROVIDER_SITE_OTHER): Payer: Medicare HMO | Admitting: Cardiology

## 2013-11-15 VITALS — BP 140/79 | HR 65 | Ht 67.0 in | Wt 186.6 lb

## 2013-11-15 DIAGNOSIS — I359 Nonrheumatic aortic valve disorder, unspecified: Secondary | ICD-10-CM

## 2013-11-15 DIAGNOSIS — E785 Hyperlipidemia, unspecified: Secondary | ICD-10-CM

## 2013-11-15 DIAGNOSIS — I351 Nonrheumatic aortic (valve) insufficiency: Secondary | ICD-10-CM

## 2013-11-15 DIAGNOSIS — I10 Essential (primary) hypertension: Secondary | ICD-10-CM

## 2013-11-15 MED ORDER — AMLODIPINE BESYLATE-VALSARTAN 10-160 MG PO TABS: 1.0000 | ORAL_TABLET | Freq: Every day | ORAL | Status: DC

## 2013-11-15 MED ORDER — CLONIDINE HCL 0.3 MG PO TABS: 0.3000 mg | ORAL_TABLET | Freq: Two times a day (BID) | ORAL | Status: DC

## 2013-11-15 MED ORDER — ATORVASTATIN CALCIUM 10 MG PO TABS: ORAL_TABLET | ORAL | Status: DC

## 2013-11-15 NOTE — Patient Instructions (Signed)
Your physician has requested that you have an echocardiogram. Echocardiography is a painless test that uses sound waves to create images of your heart. It provides your doctor with information about the size and shape of your heart and how well your heart's chambers and valves are working. This procedure takes approximately one hour. There are no restrictions for this procedure. October 2015  Your physician wants you to follow-up in: October 2015 with Dr Shirlee Latch after you have the echocardiogram.  You will receive a reminder letter in the mail two months in advance. If you don't receive a letter, please call our office to schedule the follow-up appointment.

## 2013-11-18 NOTE — Progress Notes (Signed)
Patient ID: Chelsea [name], female   DOB: 10-Apr-1951, 62 y.o.   MRN: [id number] PCP: Dr. Parke Simmers  62 yo with history of CVA and left-sided weakness as well as a history of aortic insufficiency presents for cardiology followup.  She is feeling well in general.  No chest pain.  She uses her cane to walk and gets short of breath with long distances.  Her left side, especially the arm, is weak.  Her last echo showed mild aortic insufficiency.  BP borderline elevated in the office today but has been running SBP 120s-130s at home.   Labs (11/13): K 3.7, creatinine 1.1 Labs (7/14): LDL 43, HDL 46  ECG: NSR, normal  PMH: 1. HTN 2. Hyperlipidemia 3. CVA with left-sided weakness in 2007. 4. PUD 5. Aortic insufficiency:  Echo (10/13) showed EF 55%, mild LVH, mild AI, normal RV, moderate TR, PA systolic pressure 38 mmHg.   FH: Grandmother with MI, father with cirrhosis, mother with HTN  SH: Married, nonsmoker, lives in [place].  Current Outpatient Prescriptions  Medication Sig Dispense Refill  . amLODipine-valsartan (EXFORGE) 10-160 MG per tablet Take 1 tablet by mouth daily.  90 tablet  3  . aspirin 81 MG tablet Take 81 mg by mouth daily.        Marland Kitchen atorvastatin (LIPITOR) 10 MG tablet TAKE 1 TABLET EVERY DAY  90 tablet  3  . citalopram (CELEXA) 10 MG tablet Take 10 mg by mouth daily.      . cloNIDine (CATAPRES) 0.3 MG tablet Take 1 tablet (0.3 mg total) by mouth 2 (two) times daily.  180 tablet  3  . cyanocobalamin 100 MCG tablet Take 100 mcg by mouth daily.       Marland Kitchen doxazosin (CARDURA) 8 MG tablet Take 0.5 tablets (4 mg total) by mouth at bedtime.  30 tablet  3  . fesoterodine (TOVIAZ) 4 MG TB24 Take 1 tablet (4 mg total) by mouth daily.  1 tablet  0  . Multiple Vitamin (MULTIVITAMIN) tablet Take 1 tablet by mouth daily.        Marland Kitchen omeprazole (PRILOSEC) 20 MG capsule Take 20 mg by mouth 2 (two) times daily.         No current facility-administered medications for this visit.    BP 140/79  Pulse 65   Ht 5\' 7"  (1.702 m)  Wt 84.641 kg (186 lb 9.6 oz)  BMI 29.22 kg/m2 General: NAD Neck: No JVD, no thyromegaly or thyroid nodule.  Lungs: Clear to auscultation bilaterally with normal respiratory effort. CV: Nondisplaced PMI.  Heart regular S1/S2, no S3/S4, 1/6 SEM.  No peripheral edema.  No carotid bruit.  Normal pedal pulses.  Abdomen: Soft, nontender, no hepatosplenomegaly, no distention.  Neurologic: Alert and oriented x 3.  Left facial droop and left-sided weakness.  Psych: Normal affect. Extremities: No clubbing or cyanosis.   Assessment/Plan: 1. Hyperlipidemia: Good lipids when checked in 7/14.  2. HTN: BP reasonably controlled.   3. H/o CVA: Continue ASA 81, BP control, statin. 4. Aortic insufficiency: Only mild on most recent echo.  I will not have her get a repeat echo until 10/15 unless symptoms develop.    Marca Ancona 11/18/2013

## 2013-11-19 ENCOUNTER — Ambulatory Visit (HOSPITAL_COMMUNITY)
Admission: RE | Admit: 2013-11-19 | Discharge: 2013-11-19 | Disposition: A | Discharge status: Home / Self Care | Payer: Medicare HMO | Source: Ambulatory Visit | Attending: Family Medicine | Admitting: Family Medicine

## 2013-11-19 DIAGNOSIS — Z1231 Encounter for screening mammogram for malignant neoplasm of breast: Secondary | ICD-10-CM

## 2013-12-24 ENCOUNTER — Other Ambulatory Visit: Payer: Self-pay | Admitting: Gastroenterology

## 2013-12-24 DIAGNOSIS — R933 Abnormal findings on diagnostic imaging of other parts of digestive tract: Secondary | ICD-10-CM

## 2013-12-25 ENCOUNTER — Other Ambulatory Visit: Payer: Self-pay | Admitting: Gastroenterology

## 2013-12-25 ENCOUNTER — Ambulatory Visit
Admission: RE | Admit: 2013-12-25 | Discharge: 2013-12-25 | Disposition: A | Discharge status: Home / Self Care | Payer: Commercial Managed Care - HMO | Source: Ambulatory Visit | Attending: Gastroenterology | Admitting: Gastroenterology

## 2013-12-25 DIAGNOSIS — R933 Abnormal findings on diagnostic imaging of other parts of digestive tract: Secondary | ICD-10-CM

## 2014-06-07 NOTE — Telephone Encounter (Signed)
Close encounter 

## 2014-09-16 ENCOUNTER — Ambulatory Visit (HOSPITAL_COMMUNITY): Payer: Medicare HMO | Attending: Cardiology | Admitting: Radiology

## 2014-09-16 ENCOUNTER — Other Ambulatory Visit: Payer: Self-pay

## 2014-09-16 DIAGNOSIS — I10 Essential (primary) hypertension: Secondary | ICD-10-CM | POA: Diagnosis not present

## 2014-09-16 DIAGNOSIS — E785 Hyperlipidemia, unspecified: Secondary | ICD-10-CM | POA: Diagnosis not present

## 2014-09-16 DIAGNOSIS — I359 Nonrheumatic aortic valve disorder, unspecified: Secondary | ICD-10-CM | POA: Insufficient documentation

## 2014-09-16 DIAGNOSIS — I351 Nonrheumatic aortic (valve) insufficiency: Secondary | ICD-10-CM

## 2014-09-16 NOTE — Progress Notes (Signed)
Echocardiogram performed.  

## 2014-09-17 ENCOUNTER — Telehealth: Payer: Self-pay | Admitting: Cardiology

## 2014-09-17 NOTE — Telephone Encounter (Signed)
New message  ° ° °Patient calling for test results.   °

## 2014-09-17 NOTE — Telephone Encounter (Signed)
Spoke with patient about recent echo results 

## 2014-10-11 ENCOUNTER — Other Ambulatory Visit (HOSPITAL_COMMUNITY): Payer: Self-pay | Admitting: Family Medicine

## 2014-10-11 DIAGNOSIS — Z1231 Encounter for screening mammogram for malignant neoplasm of breast: Secondary | ICD-10-CM

## 2014-11-04 ENCOUNTER — Ambulatory Visit: Payer: Commercial Managed Care - HMO | Admitting: Neurology

## 2014-11-06 ENCOUNTER — Encounter: Payer: Self-pay | Admitting: Neurology

## 2014-11-06 ENCOUNTER — Ambulatory Visit (INDEPENDENT_AMBULATORY_CARE_PROVIDER_SITE_OTHER): Payer: Commercial Managed Care - HMO | Admitting: Neurology

## 2014-11-06 VITALS — BP 128/83 | HR 88 | Temp 98.1°F | Ht 67.0 in | Wt 200.0 lb

## 2014-11-06 DIAGNOSIS — G8194 Hemiplegia, unspecified affecting left nondominant side: Secondary | ICD-10-CM

## 2014-11-06 DIAGNOSIS — I619 Nontraumatic intracerebral hemorrhage, unspecified: Secondary | ICD-10-CM | POA: Diagnosis not present

## 2014-11-06 DIAGNOSIS — G890 Central pain syndrome: Secondary | ICD-10-CM

## 2014-11-06 DIAGNOSIS — G8190 Hemiplegia, unspecified affecting unspecified side: Secondary | ICD-10-CM | POA: Diagnosis not present

## 2014-11-06 DIAGNOSIS — R259 Unspecified abnormal involuntary movements: Secondary | ICD-10-CM

## 2014-11-06 MED ORDER — GABAPENTIN 100 MG PO CAPS: ORAL_CAPSULE | ORAL | Status: DC

## 2014-11-06 NOTE — Patient Instructions (Addendum)
For you left side pain, which is likely from your stroke in 2007, we will try Neurontin (gabapentin) 100 mg strength: Take 1 pill nightly at bedtime for 1 week, then 2 pills nightly for 1 week, then 3 pills each night thereafter.  The most common side effects reported are sedation or sleepiness. Rare side effects include balance problems, confusion. Most people do not have side effects.   For your neck and back pain, you should see your orthopedic doctor again.   Try to drink enough water. Use your cane at all times.

## 2014-11-06 NOTE — Progress Notes (Signed)
Subjective:    Patient ID: Chelsea [name] is a 63 y.o. female.  HPI     Star Age, MD, PhD [facility] [address], [address] P.O. [place], [place] [address]  Dear Dr. Criss Rosales,  I saw your patient, Chelsea [name], upon your kind request in my neurologic clinic today for initial consultation of her headaches and recurrent neck pain. The patient is unaccompanied by today. As you know, Chelsea [name] is a 63 year old right-handed woman with an underlying complex medical history of hypertension, reflux disease, hyperactive bladder, arthritis, hyperlipidemia, depression, and previous stroke in 2007 with left-sided weakness, aortic insufficiency, followed by cardiology, who reports left-sided pain in her face, as well as the left hemibody. Symptoms started gradually, but she does not know exactly when they started. She did not happen immediately after the stroke in 2007. Perhaps a few years later. She has residual left-sided weakness and walks with a cane. She had therapy and home health therapy and was advised to use a cane. She describes a warmth sensation on the left side of her face and hemibody. This can be a burning pain as well at times. It is not always the same or constant. She has a tremor on the left side and difficulty with fine motor control in the left. In addition she has neck and lower back pain. She has seen a chiropractor for this. She had seen [facility] for this and her knee in the past. She's not able to pinpoint the degree of pain on the left side. Sometimes it is not actually a pain but an abnormal temperature sensation. It is not necessarily debilitating but at times quite painful when it has the burning sensation to it. She lives alone. She has 3 grown children. Her grandson is currently visiting. She does not smoke. She is on a baby aspirin. Of note, she had a right thalamic hemorrhagic stroke. She had an MRI brain with and without contrast on  11/20/2008: 1.  No evidence of acute ischemia. 2.  Stable non specific supratentorial subcortical white matter changes most likely representing areas of ischemic gliosis due to small vessel disease related to hypertension or diabetes. 3.  Interval near-complete resolution of the previous right thalamic hemorrhage. 4.  Small focal area of enhancement within the epicenter of the hemorrhage, which probably represents contrast pooling.  No evidence of a vascular abnormality however is suggested on the MRI examination.  She had a brain MRI with and without contrast and brain MRA without contrast on 04/22/2006:1.  Right thalamic hematoma with surrounding mass effect secondary to vasogenic edema. 2.  Mild mass effect on the inferior aspect of the third ventricle. 3.  No imaging evidence of abnormal enhancement or blood vessel ending from periphery of this hemorrhage.   4.  Mild sinusitis changes.  1.  No evidence of occlusion, stenosis, dissections or brain aneurysms noted on the images provided. 2.  Aneurysms of 5 mm or less may not be seen on MRA examination.    I personally reviewed the images through the PACS system and also shared images with her on the computer.  Of note, she is not sure if she snores. She denies gasping sensations and has not been told that she has pauses in her breathing. Her Past Medical History Is Significant For: Past Medical History  Diagnosis Date  . Hypertension   . Hyperlipidemia   . 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 Chest pain   .  Obesity   . Peptic ulcer disease   . Hx of hysterectomy   . Aortic regurgitation     His Past Surgical History Is Significant For: Past Surgical History  Procedure Laterality Date  . Tubal ligation    . Abdominal hysterectomy      Her Family History Is Significant For: Family History  Problem Relation Age of Onset  . Hypertension Mother     deceased  . Peripheral vascular disease Mother   . Cirrhosis Father      DICEASED  . Heart attack Other     grandmother deceased    Her Social History Is Significant For: History   Social History  . Marital Status: Married    Spouse Name: N/A    Number of Children: 3  . 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 Smoking status: Never Smoker   . Smokeless tobacco: Never Used  . Alcohol Use: No  . Drug Use: No  . Sexual Activity: Not Currently   Other Topics Concern  . None   Social History Narrative   Patient consumes no caffeine    Her Allergies Are:  No Known Allergies:   Her Current Medications Are:  Outpatient Encounter Prescriptions as of 11/06/2014  Medication Sig  . amLODipine-valsartan (EXFORGE) 10-160 MG per tablet Take 1 tablet by mouth daily.  Marland Kitchen aspirin 81 MG tablet Take 81 mg by mouth daily.    Marland Kitchen atorvastatin (LIPITOR) 10 MG tablet TAKE 1 TABLET EVERY DAY  . Calcium-Magnesium-Vitamin D (CALCIUM 500 PO) Take by mouth daily.  . citalopram (CELEXA) 10 MG tablet Take 10 mg by mouth daily.  . cloNIDine (CATAPRES) 0.3 MG tablet Take 1 tablet (0.3 mg total) by mouth 2 (two) times daily.  . cyanocobalamin 100 MCG tablet Take 100 mcg by mouth daily.   Marland Kitchen doxazosin (CARDURA) 8 MG tablet Take 0.5 tablets (4 mg total) by mouth at bedtime.  Marland Kitchen ezetimibe (ZETIA) 10 MG tablet Take 10 mg by mouth daily.  . fesoterodine (TOVIAZ) 4 MG TB24 Take 1 tablet (4 mg total) by mouth daily.  . meloxicam (MOBIC) 7.5 MG tablet   . Multiple Vitamin (MULTIVITAMIN) tablet Take 1 tablet by mouth daily.    Marland Kitchen omeprazole (PRILOSEC) 20 MG capsule Take 20 mg by mouth 2 (two) times daily.    . potassium chloride SA (K-DUR,KLOR-CON) 20 MEQ tablet   . gabapentin (NEURONTIN) 100 MG capsule Take 1 pill nightly at bedtime for 1 week, then 2 pills nightly for 1 week, then 3 pills each night thereafter.  :   Review of Systems:  Out of a complete 14 point  review of systems, all are reviewed and negative with the exception of these symptoms as listed below:  Review of Systems  Neurological: Positive for headaches.   L sided pain, weakness  Objective:  Neurologic Exam  Physical Exam Physical Examination:   Filed Vitals:   11/06/14 1030  BP: 128/83  Pulse: 88  Temp: 98.1 F (36.7 C)   General Examination: The patient is a very pleasant 63 y.o. female in no acute distress. She appears well-developed and well-nourished and well groomed.   HEENT: Normocephalic, atraumatic, pupils are equal, round and reactive to light and accommodation. Funduscopic exam is normal with sharp disc margins noted. Extraocular tracking is good without limitation to gaze excursion or nystagmus noted.  Normal smooth pursuit is noted. Hearing is grossly intact. Tympanic membranes are clear bilaterally. Face is asymmetric with mild left lower facial weakness noted. Speech is clear. She has no significant dysarthria. She has decreased sensation on the left face. Oropharynx exam reveals: moderate mouth dryness, adequate dental hygiene and mild airway crowding.   Chest: Clear to auscultation without wheezing, rhonchi or crackles noted.  Heart: S1+S2+0, regular and normal without murmurs, rubs or gallops noted.   Abdomen: Soft, non-tender and non-distended with normal bowel sounds appreciated on auscultation.  Extremities: There is no pitting edema in the distal lower extremities bilaterally. Pedal pulses are intact.  Skin: Warm and dry without trophic changes noted. There are no varicose veins.  Musculoskeletal: exam reveals no obvious joint deformities, tenderness or joint swelling or erythema.   Neurologically:  Mental status: The patient is awake, alert and oriented in all 4 spheres. Her immediate and remote memory, attention, language skills and fund of knowledge are appropriate. There is no evidence of aphasia, agnosia, apraxia or anomia. Speech is clear with  normal prosody and enunciation. Thought process is linear. Mood is normal and affect is normal.  Cranial nerves II - XII are as described above under HEENT exam. In addition: shoulder shrug is normal with equal shoulder height noted. Motor exam: She has an overall fairly thin bulk. Strength is normal on the right. She has 4 out of 5 weakness on the left. She has a coarse intention tremor on the left and no resting tremor. She has difficulty with fine motor skills and involuntary movements in the left upper extremity in particular with action. Reflexes are mildly hyperactive on the left and normal on the right. She has mild difficulty standing and walks with a slight limp on the left. She can walk some without her cane. I did not ask her to do tandem walk. Romberg shows mild sway.  She reports mild low back pain while standing up. This is not a sciatica type pain.  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 On examination she has residual left hemiparesis and left hemibody decrease in sensation and describes abnormal sensations and pain on the left side including her face, arm and leg. 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 I explained to the patient that her involuntary movements are likely secondary to the stroke as well. These can be very difficult to treat and the pain can also be resistant to treatment. Nevertheless, she has never tried gabapentin and I would like to proceed with a trial of gabapentin with a small dose and slow titration. She will start with 100 mg at night and build  up to 300 mg over the next 3 weeks. We talked about common side effects including balance problems and sedation and she is advised to be cautious and use her cane at all times. She reports normal liver and kidney function. If she has not had routine blood work done I would suggest that she have this with you. For her neck and back pain she has seen orthopedics before and is advised to see orthopedics at [facility] again. I answered  all her questions today and she was in agreement. I will see her routinely in a few months and have encouraged her to call with any interim questions. I provided her with a new prescription for gabapentin. Thank you very much for allowing me to participate in the care of this nice patient. If I can be of any further assistance to you please do not hesitate to call me at [phone].  Sincerely,   Star Age, MD, PhD

## 2014-11-25 ENCOUNTER — Other Ambulatory Visit: Payer: Self-pay | Admitting: Cardiology

## 2014-11-26 ENCOUNTER — Ambulatory Visit (HOSPITAL_COMMUNITY)
Admission: RE | Admit: 2014-11-26 | Discharge: 2014-11-26 | Disposition: A | Discharge status: Home / Self Care | Payer: Commercial Managed Care - HMO | Source: Ambulatory Visit | Attending: Family Medicine | Admitting: Family Medicine

## 2014-11-26 DIAGNOSIS — Z1231 Encounter for screening mammogram for malignant neoplasm of breast: Secondary | ICD-10-CM | POA: Diagnosis present

## 2015-02-06 ENCOUNTER — Other Ambulatory Visit: Payer: Self-pay

## 2015-02-06 ENCOUNTER — Telehealth: Payer: Self-pay | Admitting: Cardiology

## 2015-02-06 DIAGNOSIS — I351 Nonrheumatic aortic (valve) insufficiency: Secondary | ICD-10-CM

## 2015-02-06 MED ORDER — ATORVASTATIN CALCIUM 10 MG PO TABS: 10.0000 mg | ORAL_TABLET | Freq: Every day | ORAL | Status: DC

## 2015-02-06 MED ORDER — CLONIDINE HCL 0.3 MG PO TABS: 0.3000 mg | ORAL_TABLET | Freq: Two times a day (BID) | ORAL | Status: DC

## 2015-02-06 NOTE — Telephone Encounter (Signed)
New Message        Pt calling stating she wants to speak to Dr. Claris Gladden nurse to find out when she needs to come in for her next visit. I offered to schedule pt and appt w/ Dr. Aundra Dubin or a PA since she hasn't been seen in over a year but pt declined and wants to find out from the nurse when she is supposed to come back. Please call back and advise.

## 2015-02-06 NOTE — Telephone Encounter (Signed)
Left message that I would be forwarding this to Dr. Claris Gladden nurse and [facility] for scheduling. Informed on message that someone would call her back about appointment.

## 2015-02-10 NOTE — Telephone Encounter (Signed)
Per Dr Scarlette Shorts schedule with a PA/NP on a day Dr Aundra Dubin is in the office.  Please let the patient know Dr Aundra Dubin will come see her after the PA/NP has seen her that day.

## 2015-03-10 ENCOUNTER — Encounter: Payer: Self-pay | Admitting: Neurology

## 2015-03-10 ENCOUNTER — Ambulatory Visit (INDEPENDENT_AMBULATORY_CARE_PROVIDER_SITE_OTHER): Payer: Commercial Managed Care - HMO | Admitting: Neurology

## 2015-03-10 VITALS — BP 124/66 | HR 65 | Resp 16 | Ht 67.0 in | Wt 208.0 lb

## 2015-03-10 DIAGNOSIS — G8194 Hemiplegia, unspecified affecting left nondominant side: Secondary | ICD-10-CM

## 2015-03-10 DIAGNOSIS — G8190 Hemiplegia, unspecified affecting unspecified side: Secondary | ICD-10-CM | POA: Diagnosis not present

## 2015-03-10 DIAGNOSIS — G890 Central pain syndrome: Secondary | ICD-10-CM

## 2015-03-10 DIAGNOSIS — R259 Unspecified abnormal involuntary movements: Secondary | ICD-10-CM

## 2015-03-10 DIAGNOSIS — I619 Nontraumatic intracerebral hemorrhage, unspecified: Secondary | ICD-10-CM | POA: Diagnosis not present

## 2015-03-10 MED ORDER — GABAPENTIN 300 MG PO CAPS: 300.0000 mg | ORAL_CAPSULE | Freq: Three times a day (TID) | ORAL | Status: DC

## 2015-03-10 NOTE — Patient Instructions (Signed)
We will increase your gabapentin to 300 mg strength: take 1 twice daily for 2 weeks, then 1 pill three times a day, thereafter.  You can use up your previous gabapentin 100 mg 3 at night, and when you fill the new prescription, you can start the new dosing regimen.

## 2015-03-10 NOTE — Progress Notes (Signed)
Subjective:    Patient ID: Chelsea [name] is a 64 y.o. female.  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 She is unaccompanied today. I first met her on 11/06/2014 at the request of her primary care physician, at which time the patient reported a several year history of left-sided pain in her face as well as hemibody. Symptoms started perhaps a few years after her stroke in 2007. I suggested symptomatic treatment with gabapentin. 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 I suggested she titrate gabapentin starting at 100 mg at night.  Today, 03/10/15: she reports that the gabapentin was somewhat helpful. She still has some episodic pain shooting through her face or down her left arm or leg. Overall her pain intensity is improved. She denies any side effects with gabapentin and is currently taking 300 mg each night. She had no significant medication changes except for being on a new bladder medication per primary care physician. She had an appointment with her last month. She reports no new problems otherwise.  Previously:   She reports left-sided pain in her face, as well as the left hemibody. Symptoms started gradually, but she does not know exactly when they started. It did not happen immediately after the stroke in 2007, perhaps a few years later. She has residual left-sided weakness and walks with a cane. She had therapy and home health therapy and was advised to use a cane. She describes a warmth sensation on the left side of her face and hemibody. This can be a  burning pain as well at times. It is not always the same or constant. She has a tremor on the left side and difficulty with fine motor control in the left. In addition she has neck and lower back pain. She has seen a chiropractor for this. She had seen [facility] for this and her knee in the past. She's not able to pinpoint the degree of pain on the left side. Sometimes it is not actually a pain but an abnormal temperature sensation. It is not necessarily debilitating but at times quite painful when it has the burning sensation to it. She lives alone. She has 3 grown children. Her grandson is currently visiting. She does not smoke. She is on a baby aspirin. Of note, she had a right thalamic hemorrhagic stroke. She had an MRI brain with and without contrast on 11/20/2008: 1.  No evidence of acute ischemia. 2.  Stable non specific supratentorial subcortical white matter changes most likely representing areas of ischemic gliosis due to small vessel disease related to hypertension or diabetes. 3.  Interval near-complete resolution of the previous right thalamic hemorrhage. 4.  Small focal area of enhancement within the epicenter of the hemorrhage, which probably represents contrast pooling.  No evidence of a vascular abnormality however is suggested on the MRI examination.  She had a brain MRI with and without contrast and brain MRA without contrast on 04/22/2006:1.  Right thalamic hematoma with surrounding mass effect secondary to vasogenic edema. 2.  Mild mass effect on the inferior aspect of the third ventricle. 3.  No imaging evidence of abnormal enhancement or blood  vessel ending from periphery of this hemorrhage.   4.  Mild sinusitis changes.   1.  No evidence of occlusion, stenosis, dissections or brain aneurysms noted on the images provided. 2.  Aneurysms of 5 mm or less may not be seen on MRA examination.    I personally reviewed the images through the PACS system and also shared images with  her on the computer.  Of note, she is not sure if she snores. She denies gasping sensations and has not been told that she has pauses in her breathing.  Her Past Medical History Is Significant For: Past Medical History  Diagnosis Date  . Hypertension   . Hyperlipidemia   . 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 Chest pain   . Obesity   . Peptic ulcer disease   . Hx of hysterectomy   . Aortic regurgitation     Her Past Surgical History Is Significant For: Past Surgical History  Procedure Laterality Date  . Tubal ligation    . Abdominal hysterectomy      Her Family History Is Significant For: Family History  Problem Relation Age of Onset  . Hypertension Mother     deceased  . Peripheral vascular disease Mother   . Cirrhosis Father     DICEASED  . Heart attack Other     grandmother deceased    Her Social History Is Significant For: History   Social History  . Marital Status: Married    Spouse Name: N/A  . Number of Children: 3  . 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 Smoking status: Never Smoker   . Smokeless tobacco: Never Used  . Alcohol Use: No  . Drug Use: No  . Sexual Activity: Not Currently   Other Topics Concern  . None   Social History Narrative   Patient consumes no caffeine    Her Allergies Are:  No Known Allergies:   Her Current Medications Are:  Outpatient Encounter Prescriptions as of 03/10/2015  Medication Sig  . amLODipine-valsartan (EXFORGE) 10-160 MG per tablet Take 1 tablet by mouth daily.  Marland Kitchen aspirin 81 MG tablet Take 81 mg by mouth daily.    Marland Kitchen atorvastatin (LIPITOR) 10 MG tablet Take 1 tablet (10 mg total) by mouth daily.  . Calcium-Magnesium-Vitamin D (CALCIUM 500 PO) Take by mouth daily.  . citalopram (CELEXA) 10 MG tablet Take 10 mg by mouth daily.  . cloNIDine (CATAPRES) 0.3 MG tablet Take 1  tablet (0.3 mg total) by mouth 2 (two) times daily.  . cyanocobalamin 100 MCG tablet Take 100 mcg by mouth daily.   . diclofenac (VOLTAREN) 75 MG EC tablet   . doxazosin (CARDURA) 8 MG tablet Take 0.5 tablets (4 mg total) by mouth at bedtime.  Marland Kitchen ezetimibe (ZETIA) 10 MG tablet Take 10 mg by mouth daily.  Marland Kitchen gabapentin (NEURONTIN) 100 MG capsule Take 1 pill nightly at bedtime for 1 week, then 2 pills nightly for 1 week, then 3 pills each night thereafter.  . Multiple Vitamin (MULTIVITAMIN) tablet Take 1 tablet by mouth daily.    Marland Kitchen omeprazole (PRILOSEC) 20 MG capsule Take 20 mg by mouth 2 (two) times daily.    . potassium chloride SA (K-DUR,KLOR-CON) 20 MEQ tablet   . [DISCONTINUED] fesoterodine (TOVIAZ) 4 MG TB24 Take 1 tablet (4 mg total) by  mouth daily.  . [DISCONTINUED] meloxicam (MOBIC) 7.5 MG tablet   :  Review of Systems:  Out of a complete 14 point review of systems, all are reviewed and negative with the exception of these symptoms as listed below:   Review of Systems  Neurological:       L sided rib pain when stretching, exercising, and coughing.      Objective:  Neurologic Exam  Physical Exam Physical Examination:   Filed Vitals:   03/10/15 1301  BP: 124/66  Pulse: 65  Resp: 16   General Examination: The patient is a very pleasant 64 y.o. female in no acute distress. She appears well-developed and well-nourished and well groomed. She wears a head scarf.   HEENT: Normocephalic, atraumatic, pupils are equal, round and reactive to light and accommodation. Funduscopic exam is normal with sharp disc margins noted. She has mild b/l cataracts. Extraocular tracking is good without limitation to gaze excursion or nystagmus noted. Normal smooth pursuit is noted. Hearing is grossly intact. Face is asymmetric with mild left lower facial weakness noted. Speech is clear. She has no significant dysarthria. She has decreased sensation to all modalities on the left face. Oropharynx exam  reveals: moderate mouth dryness, adequate dental hygiene and mild airway crowding.   Chest: Clear to auscultation without wheezing, rhonchi or crackles noted.  Heart: S1+S2+0, regular and normal without murmurs, rubs or gallops noted.   Abdomen: Soft, non-tender and non-distended with normal bowel sounds appreciated on auscultation.  Extremities: There is no pitting edema in the distal lower extremities bilaterally. Pedal pulses are intact.  Skin: Warm and dry without trophic changes noted. There are no varicose veins.  Musculoskeletal: exam reveals no obvious joint deformities, tenderness or joint swelling or erythema.   Neurologically:  Mental status: The patient is awake, alert and oriented in all 4 spheres. Her immediate and remote memory, attention, language skills and fund of knowledge are appropriate. There is no evidence of aphasia, agnosia, apraxia or anomia. Speech is clear with normal prosody and enunciation. Thought process is linear. Mood is normal and affect is normal.  Cranial nerves II - XII are as described above under HEENT exam. In addition: shoulder shrug is normal with equal shoulder height noted. Motor exam: She has an overall fairly thin bulk. Strength is normal on the right. She has 4 out of 5 weakness on the left. She has mild twitching in the LUE. She has a coarse intention tremor on the left and no resting tremor. She has difficulty with fine motor skills and has involuntary movements in the left upper extremity in particular with action. sensory exam shows decreased all modalities on the left hemibody. She has some sensation of a dull touch and has no sensation for vibration on the left.  Toes are downgoing on the R and equivocal on the L. Reflexes are mildly hyperactive on the left and normal on the right. She has mild difficulty standing and walks with a slight limp on the left. She has mild circumduction on the L. She can walk some without her cane. She cannot do  tandem walk. Romberg shows mild sway.   Assessment and Plan:    In summary, KAYLEEN ALIG is a very pleasant 64 year old female with an underlying complex medical history of hypertension, reflux disease, hyperactive bladder, arthritis, hyperlipidemia, depression, and previous stroke in 2007 with left-sided weakness, aortic insufficiency, followed by cardiology, who reports left-sided pain in her face, as well as the left hemibody. On  examination she has residual left hemiparesis and left hemibody decrease in sensation and describes abnormal sensations and pain on the left side including her face, arm and leg. 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 I explained to the patient that her involuntary movements are likely secondary to the stroke as well. These can be very difficult to treat and the pain can also be resistant to treatment. Nevertheless, she has had some success with low-dose gabapentin at 300 mg each night with no side effects reported. I would like to increase her gabapentin to 300 mg strength one pill twice daily for the next couple weeks and then increase to 1 pill 3 times a day thereafter. She is again reminded about potential side effects and encouraged to call with any interim questions or concerns. I would like to see her back routinely in 4-5 months, sooner if the need arises. Her exam is otherwise stable.  I spent 15 minutes in total face-to-face time with the patient, more than 50% of which was spent in counseling and coordination of care, reviewing test results, reviewing medication and discussing or reviewing the diagnosis of thalamic stroke and residual weakness secondary to thalamic stroke and thalamic pain syndrome, its prognosis and treatment options.

## 2015-03-18 ENCOUNTER — Encounter: Payer: Self-pay | Admitting: Nurse Practitioner

## 2015-03-18 ENCOUNTER — Ambulatory Visit (INDEPENDENT_AMBULATORY_CARE_PROVIDER_SITE_OTHER): Payer: Commercial Managed Care - HMO | Admitting: Nurse Practitioner

## 2015-03-18 VITALS — BP 118/80 | HR 76 | Ht 66.0 in | Wt 205.0 lb

## 2015-03-18 DIAGNOSIS — I10 Essential (primary) hypertension: Secondary | ICD-10-CM | POA: Diagnosis not present

## 2015-03-18 DIAGNOSIS — I359 Nonrheumatic aortic valve disorder, unspecified: Secondary | ICD-10-CM | POA: Diagnosis not present

## 2015-03-18 DIAGNOSIS — E785 Hyperlipidemia, unspecified: Secondary | ICD-10-CM | POA: Diagnosis not present

## 2015-03-18 DIAGNOSIS — I351 Nonrheumatic aortic (valve) insufficiency: Secondary | ICD-10-CM

## 2015-03-18 LAB — LIPID PANEL
Cholesterol: 151 mg/dL (ref 0–200)
HDL: 54.4 mg/dL (ref >39.00)
LDL Cholesterol: 75 mg/dL (ref 0–99)
NonHDL: 96.6
Total CHOL/HDL Ratio: 3
Triglycerides: 106 mg/dL (ref 0.0–149.0)
VLDL: 21.2 mg/dL (ref 0.0–40.0)

## 2015-03-18 NOTE — Progress Notes (Signed)
Patient Name: Chelsea [name] Date of Encounter: 03/18/2015  Primary Care Provider:  Elyn Peers, MD Primary Cardiologist:  Einar Crow, MD   Chief Complaint  64 year old female with prior history of hypertension and aortic insufficiency who presents for routine follow-up.  Past Medical History   Past Medical History  Diagnosis Date  . Hypertension   . Hyperlipidemia   . 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 Chest pain   . Obesity   . Peptic ulcer disease   . Hx of hysterectomy   . Mild to Moderate Aortic Insufficiency     a. 08/2014 Echo: EF 55-60%, mild conc LVH, no rwma, Gr 1 DD, mild-mod AI, mild MR.  . Morbid obesity    Past Surgical History  Procedure Laterality Date  . Tubal ligation    . Abdominal hysterectomy      Allergies  No Known Allergies  HPI  64 year old female with the above problem list. She is status post stroke in 2007 with some mild, residual left-sided weakness. She also has a history of aortic insufficiency and had an echocardiogram performed in October 2015 showing normal LV function with mild to moderate AI. Over the past year, she has been doing reasonably well. She does walk some and is able to do this without significant dyspnea on exertion. She does not express any chest pain. She denies PND, orthopnea, dizziness, syncope, edema, or early satiety. She is interested in losing weight and has asked her primary care provider for diet. We did discuss diet and exercise at length as well as calorie counting and available applications to assist her in losing weight.  Home Medications  Prior to Admission medications   Medication Sig Start Date End Date Taking? Authorizing Provider  amLODipine-valsartan (EXFORGE) 10-160 MG per tablet Take 1 tablet by mouth daily. 11/15/13  Yes Larey Dresser, MD  aspirin 81 MG tablet Take 81 mg by mouth daily.     Yes Historical Provider, MD  atorvastatin (LIPITOR) 10 MG tablet Take 1  tablet (10 mg total) by mouth daily. 02/06/15  Yes Larey Dresser, MD  Calcium-Magnesium-Vitamin D (CALCIUM 500 PO) Take by mouth daily.   Yes Historical Provider, MD  citalopram (CELEXA) 10 MG tablet Take 10 mg by mouth daily.   Yes Historical Provider, MD  cloNIDine (CATAPRES) 0.3 MG tablet Take 1 tablet (0.3 mg total) by mouth 2 (two) times daily. 02/06/15  Yes Larey Dresser, MD  cyanocobalamin 100 MCG tablet Take 100 mcg by mouth daily.    Yes Historical Provider, MD  diclofenac (VOLTAREN) 75 MG EC tablet  03/05/15  Yes Historical Provider, MD  doxazosin (CARDURA) 8 MG tablet Take 0.5 tablets (4 mg total) by mouth at bedtime. 12/05/12  Yes Hillary Bow, MD  ezetimibe (ZETIA) 10 MG tablet Take 10 mg by mouth daily.   Yes Historical Provider, MD  gabapentin (NEURONTIN) 300 MG capsule Take 300 mg by mouth 3 (three) times daily.   Yes Historical Provider, MD  Multiple Vitamin (MULTIVITAMIN) tablet Take 1 tablet by mouth daily.     Yes Historical Provider, MD  omeprazole (PRILOSEC) 20 MG capsule Take 20 mg by mouth 2 (two) times daily.     Yes Historical Provider, MD  potassium chloride SA (K-DUR,KLOR-CON) 20 MEQ tablet  09/01/14  Yes Historical Provider, MD    Review of Systems  As above, she is overall doing well.   She denies chest pain, palpitations, dyspnea,  pnd, orthopnea, n, v, dizziness, syncope, edema, weight gain, or early satiety. All other systems reviewed and are otherwise negative except as noted above.  Physical Exam  VS:  BP 118/80 mmHg  Pulse 76  Ht 5\' 6"  (1.676 m)  Wt 205 lb (92.987 kg)  BMI 33.10 kg/m2 , BMI Body mass index is 33.1 kg/(m^2). GEN: Well nourished, well developed, in no acute distress. HEENT: normal. Neck: Supple, no JVD, carotid bruits, or masses. Cardiac: RRR, 2/6 systolic murmur at the right upper sternal border, no rubs, or gallops. No clubbing, cyanosis, edema.  Radials/DP/PT 2+ and equal bilaterally.  Respiratory:  Respirations regular and unlabored,  clear to auscultation bilaterally. GI: Soft, nontender, nondistended, BS + x 4. MS: no deformity or atrophy. Skin: warm and dry, no rash. Neuro:  Strength and sensation are intact. Psych: Normal affect.  Accessory Clinical Findings  ECG - regular sinus rhythm, 76, nonspecific T changes, left axis. No acute changes.  Assessment & Plan  1.  Hypertension: This is well controlled on multiple medications including calcium channel blocker, ARB, and clonidine.  2. Hyperlipidemia: She is on Lipitor therapy. We will check lipid profile today.  3. Mild to moderate aortic insufficiency: She had echo in October 2015 showing normal LV function and mild to moderate AI. Plan to follow up echo in October 2017 as recommended by Dr. Aundra Dubin.  4. Morbid obesity: Patient is interested in losing weight. She does walk some. We discussed the benefits of calorie counting and phone applications such as My Fitness Pal to assist her in doing so. She does walk some but feels that her neighborhood is not conducive to regular outside exercise.  5. Disposition: Follow-up lipids today. Follow-up with Dr. Marigene Ehlers in one year.  Murray Hodgkins, NP 03/18/2015, 4:36 PM

## 2015-03-18 NOTE — Patient Instructions (Signed)
Medication Instructions:  Your physician recommends that you continue on your current medications as directed. Please refer to the Current Medication list given to you today.   Labwork: Your physician recommends that you have labs today lipids   Testing/Procedures: Your physician has requested that you have an echocardiogram in 2017. Echocardiography is a painless test that uses sound waves to create images of your heart. It provides your doctor with information about the size and shape of your heart and how well your heart's chambers and valves are working. This procedure takes approximately one hour. There are no restrictions for this procedure.    Follow-Up: Your physician wants you to follow-up in: 1 year with Dr. Aundra Dubin. You will receive a reminder letter in the mail two months in advance. If you don't receive a letter, please call our office to schedule the follow-up appointment.   Any Other Special Instructions Will Be Listed Below (If Applicable).

## 2015-03-19 ENCOUNTER — Telehealth: Payer: Self-pay | Admitting: Cardiology

## 2015-03-19 NOTE — Telephone Encounter (Signed)
Follow up      Returned Michelle's call

## 2015-03-19 NOTE — Telephone Encounter (Signed)
NEw Message  Pt wanted to inform our office that she made an appt- 4/25 @ 1 pm w/ Doree Fudge. Pt wanted to discuss what to do going forward. Please call back and discuss.

## 2015-03-19 NOTE — Telephone Encounter (Signed)
Left message for patient to call office.  

## 2015-03-19 NOTE — Telephone Encounter (Signed)
Spoke with patient who states she is scheduled for an MRI Monday at 1:00 pm and was advised to notify Dr. Aundra Dubin.  I questioned the reason Dr. Aundra Dubin wanted to know and patient realized that she had called the wrong office; states she was supposed to let someone else know.  I advised the patient to call back with questions or concerns.

## 2015-06-10 DIAGNOSIS — Z713 Dietary counseling and surveillance: Secondary | ICD-10-CM | POA: Diagnosis not present

## 2015-06-10 DIAGNOSIS — E669 Obesity, unspecified: Secondary | ICD-10-CM | POA: Diagnosis not present

## 2015-06-10 DIAGNOSIS — I119 Hypertensive heart disease without heart failure: Secondary | ICD-10-CM | POA: Diagnosis not present

## 2015-06-10 DIAGNOSIS — I69952 Hemiplegia and hemiparesis following unspecified cerebrovascular disease affecting left dominant side: Secondary | ICD-10-CM | POA: Diagnosis not present

## 2015-06-13 DIAGNOSIS — M47816 Spondylosis without myelopathy or radiculopathy, lumbar region: Secondary | ICD-10-CM | POA: Diagnosis not present

## 2015-07-10 DIAGNOSIS — F064 Anxiety disorder due to known physiological condition: Secondary | ICD-10-CM | POA: Diagnosis not present

## 2015-07-10 DIAGNOSIS — E669 Obesity, unspecified: Secondary | ICD-10-CM | POA: Diagnosis not present

## 2015-07-10 DIAGNOSIS — R739 Hyperglycemia, unspecified: Secondary | ICD-10-CM | POA: Diagnosis not present

## 2015-07-21 ENCOUNTER — Encounter: Payer: Self-pay | Admitting: Dietician

## 2015-07-21 ENCOUNTER — Encounter: Payer: Medicaid Other | Attending: Family Medicine | Admitting: Dietician

## 2015-07-21 DIAGNOSIS — Z713 Dietary counseling and surveillance: Secondary | ICD-10-CM | POA: Insufficient documentation

## 2015-07-21 DIAGNOSIS — Z6835 Body mass index (BMI) 35.0-35.9, adult: Secondary | ICD-10-CM | POA: Insufficient documentation

## 2015-07-21 NOTE — Patient Instructions (Addendum)
If you want coffee, try with either 1-2 tsp of sugar or splenda/stevia. Try switching to 1% milk. Aim to eat at least 3 x day. Have protein and carbs with each meal and snack. Aim to fill half of your plate with vegetables, have a quarter of your plate protein and a quarter of your plate starch/fruit. Try to use a small plate to help with portion sizes. Try Powerade Zero instead of Gatorade.  Try Diet V8 Splash or Diet Cranberry Ocean Spray (5 calories) juice. Try to eat meals and snacks without the TV on.  Continue to exercise most days.  Use canola for cooking vegetables. Frozen vegetables are just as good as fresh vegetables.

## 2015-07-21 NOTE — Progress Notes (Signed)
  Medical Nutrition Therapy:  Appt start time: 1400 end time:  4536.   Assessment:  Primary concerns today: Chelsea [name] is here today to try to lose weight and was told she had pre-diabetes. Hgb A1c was 5.7% in early August. Has been walking/exercising. Was eating cake, chips, fritos, etc until 2 weeks ago.  Weight was at 170 lbs for a while and starting gaining in the past year. Weight loss goal is 150 lbs. Has some fluid in her legs and taking lasix now. Not able to get on floor to door exercise since she has trouble getting up.   She is not working. Had a stroke in 2007 which affected her left side. Lives by herself and does her meal preparation/food shopping. Doesn't eat in the morning since she is not hungry. Misses lunch 1-2 x week. Very seldom eats meals out.  Appetites varies by day. Portion sizes are not very large. Watches her salt intake. Using Mrs. Dash now and unsalted butter.  Preferred Learning Style:   No preference indicated   Learning Readiness:   Ready  MEDICATIONS: see list   DIETARY INTAKE:  Usual eating pattern includes 2  meals and 1 snacks per day.  Avoided foods include mushrooms, okra    24-hr recall:  B ( AM):none  Snk ( AM): none  L ( PM): none or 1 egg with applesauce Snk ( PM): none or pie (rare) D ( PM): chicken with zucchini and onions, hamburger (no bun) with rice and cornbread Snk ( PM): apple or watermelon Beverages: water, 2 cups juice, 1/2 bottle Gatorade, coffee sometimes with 2 tablespoons of sugar  Usual physical activity: walking if it's not too hot/raining for 10 minutes and 50 sit ups/other exercises for 30 minutes each day  Estimated energy needs: 1600 calories 180 g carbohydrates 120 g protein 44 g fat  Progress Towards Goal(s):  In progress.   Nutritional Diagnosis:  [place]-3.3 Overweight/obesity As related to hx of energy dense food choices and sugary beverages.  As evidenced by BMI of 35.9.    Intervention:  Nutrition counseling  provided. Plan: If you want coffee, try with either 1-2 tsp of sugar or splenda/stevia. Try switching to 1% milk. Aim to eat at least 3 x day. Have protein and carbs with each meal and snack. Aim to fill half of your plate with vegetables, have a quarter of your plate protein and a quarter of your plate starch/fruit. Try to use a small plate to help with portion sizes. Try Powerade Zero instead of Gatorade.  Try Diet V8 Splash or Diet Cranberry Ocean Spray (5 calories) juice. Try to eat meals and snacks without the TV on.  Continue to exercise most days.  Use canola for cooking vegetables. Frozen vegetables are just as good as fresh vegetables.   Teaching Method Utilized:  Visual Auditory Hands on  Handouts given during visit include:  MyPlate Handout  15 g CHO Snacks  Meal Plan  Barriers to learning/adherence to lifestyle change: limited income, limited mobility  Demonstrated degree of understanding via:  Teach Back   Monitoring/Evaluation:  Dietary intake, exercise, and body weight in 3 month(s).

## 2015-08-14 ENCOUNTER — Ambulatory Visit (INDEPENDENT_AMBULATORY_CARE_PROVIDER_SITE_OTHER): Payer: Medicare Other | Admitting: Neurology

## 2015-08-14 ENCOUNTER — Encounter: Payer: Self-pay | Admitting: Neurology

## 2015-08-14 VITALS — BP 120/78 | HR 70 | Resp 16 | Ht 66.0 in | Wt 221.0 lb

## 2015-08-14 DIAGNOSIS — G8194 Hemiplegia, unspecified affecting left nondominant side: Secondary | ICD-10-CM

## 2015-08-14 DIAGNOSIS — G890 Central pain syndrome: Secondary | ICD-10-CM

## 2015-08-14 DIAGNOSIS — G8190 Hemiplegia, unspecified affecting unspecified side: Secondary | ICD-10-CM

## 2015-08-14 DIAGNOSIS — R259 Unspecified abnormal involuntary movements: Secondary | ICD-10-CM

## 2015-08-14 DIAGNOSIS — I619 Nontraumatic intracerebral hemorrhage, unspecified: Secondary | ICD-10-CM

## 2015-08-14 MED ORDER — GABAPENTIN 300 MG PO CAPS: 300.0000 mg | ORAL_CAPSULE | Freq: Three times a day (TID) | ORAL | Status: DC

## 2015-08-14 NOTE — Progress Notes (Signed)
Subjective:    Patient ID: Chelsea [name] is a 64 y.o. female.  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 She is unaccompanied today. I last saw her on 03/10/2015, at which time she reported that the gabapentin was somewhat helpful. She had some residual pain. She was taking 300 mg each night. We gradually increased it to tid.   Today, 08/14/2015: She reports doing fairly well. She needs some assistance from the [place] to help  Take her trash can out and buy can. She lives alone. She has not fallen. She feels that the increase in gabapentin was helpful. She does not drive. She has ongoing left-sided weakness and involuntary movements. She tries to drink enough water. She has gained some weight and is scheduled to see a nutritionist.  Previously:  I first met her on 11/06/2014 at the request of her primary care physician, at which time the patient reported a several year history of left-sided pain in her face as well as hemibody. Symptoms started perhaps a few years after her stroke in 2007. I suggested symptomatic treatment with gabapentin. 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 I suggested she titrate gabapentin starting at 100 mg at night.   She reports left-sided pain in her face, as well as the left hemibody. Symptoms started gradually, but she does not know exactly when they started. It did not happen immediately after the stroke in 2007, perhaps a few years later. She has residual left-sided weakness and walks with a cane. She had therapy and home health  therapy and was advised to use a cane. She describes a warmth sensation on the left side of her face and hemibody. This can be a burning pain as well at times. It is not always the same or constant. She has a tremor on the left side and difficulty with fine motor control in the left. In addition she has neck and lower back pain. She has seen a chiropractor for this. She had seen [facility] for this and her knee in the past. She's not able to pinpoint the degree of pain on the left side. Sometimes it is not actually a pain but an abnormal temperature sensation. It is not necessarily debilitating but at times quite painful when it has the burning sensation to it. She lives alone. She has 3 grown children. Her grandson is currently visiting. She does not smoke. She is on a baby aspirin. Of note, she had a right thalamic hemorrhagic stroke. She had an MRI brain with and without contrast on 11/20/2008: 1.  No evidence of acute ischemia. 2.  Stable non specific supratentorial subcortical white matter changes most likely representing areas of ischemic gliosis due to small vessel disease related to hypertension or diabetes. 3.  Interval near-complete resolution of the previous right thalamic hemorrhage. 4.  Small focal area of enhancement within the epicenter of the hemorrhage, which probably represents contrast pooling.  No evidence of a vascular abnormality however is suggested on the MRI examination.   She had a brain MRI with and without contrast and brain MRA without contrast on 04/22/2006:1.  Right thalamic hematoma with surrounding  mass effect secondary to vasogenic edema. 2.  Mild mass effect on the inferior aspect of the third ventricle. 3.  No imaging evidence of abnormal enhancement or blood vessel ending from periphery of this hemorrhage.   4.  Mild sinusitis changes.   1.  No evidence of occlusion, stenosis, dissections or brain aneurysms noted on the images provided. 2.  Aneurysms of 5 mm or  less may not be seen on MRA examination.    I personally reviewed the images through the PACS system and also shared images with her on the computer.  Of note, she is not sure if she snores. She denies gasping sensations and has not been told that she has pauses in her breathing.  Her Past Medical History Is Significant For: Past Medical History  Diagnosis Date  . Hypertension   . Hyperlipidemia   . 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 Chest pain   . Obesity   . Peptic ulcer disease   . Hx of hysterectomy   . Mild to Moderate Aortic Insufficiency     a. 08/2014 Echo: EF 55-60%, mild conc LVH, no rwma, Gr 1 DD, mild-mod AI, mild MR.  . Morbid obesity     Her Past Surgical History Is Significant For: Past Surgical History  Procedure Laterality Date  . Tubal ligation    . Abdominal hysterectomy      Her Family History Is Significant For: Family History  Problem Relation Age of Onset  . Hypertension Mother     deceased  . Peripheral vascular disease Mother   . Cirrhosis Father     DICEASED  . Heart attack Other     grandmother deceased    Her Social History Is Significant For: Social History   Social History  . Marital Status: Married    Spouse Name: N/A  . Number of Children: 3  . 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 Smoking status: Never Smoker   . Smokeless tobacco: Never Used  . Alcohol Use: No  . Drug Use: No  . Sexual Activity: Not Currently   Other Topics Concern  . None   Social History Narrative   Patient consumes no caffeine    Her Allergies Are:  No Known Allergies:   Her Current Medications Are:  Outpatient Encounter Prescriptions as of 08/14/2015  Medication Sig  . amLODipine-valsartan (EXFORGE) 10-160 MG per tablet Take 1 tablet by mouth daily.  Marland Kitchen aspirin 81 MG tablet Take 81 mg by mouth  daily.    Marland Kitchen atorvastatin (LIPITOR) 10 MG tablet Take 1 tablet (10 mg total) by mouth daily.  . Calcium-Magnesium-Vitamin D (CALCIUM 500 PO) Take by mouth daily.  . citalopram (CELEXA) 10 MG tablet Take 10 mg by mouth daily.  . cloNIDine (CATAPRES) 0.3 MG tablet Take 1 tablet (0.3 mg total) by mouth 2 (two) times daily.  . cyanocobalamin 100 MCG tablet Take 100 mcg by mouth daily.   Marland Kitchen doxazosin (CARDURA) 8 MG tablet Take 0.5 tablets (4 mg total) by mouth at bedtime.  Marland Kitchen ezetimibe (ZETIA) 10 MG tablet Take 10 mg by mouth daily.  . furosemide (LASIX) 20 MG tablet Take 20 mg by mouth.  . gabapentin (NEURONTIN) 300 MG capsule Take 300 mg by mouth 3 (three) times daily.  . mirabegron ER (MYRBETRIQ) 50 MG  TB24 tablet Take 50 mg by mouth daily.  . Multiple Vitamin (MULTIVITAMIN) tablet Take 1 tablet by mouth daily.    Marland Kitchen omeprazole (PRILOSEC) 20 MG capsule Take 20 mg by mouth 2 (two) times daily.    Marland Kitchen omeprazole (PRILOSEC) 40 MG capsule   . [DISCONTINUED] diclofenac (VOLTAREN) 75 MG EC tablet   . [DISCONTINUED] potassium chloride SA (K-DUR,KLOR-CON) 20 MEQ tablet    No facility-administered encounter medications on file as of 08/14/2015.  :  Review of Systems:  Out of a complete 14 point review of systems, all are reviewed and negative with the exception of these symptoms as listed below:   Review of Systems  Neurological:       Patient would like to discuss her "nerve pain". States that the increase in gabapentin has helped "some".     Objective:  Neurologic Exam  Physical Exam Physical Examination:   Filed Vitals:   08/14/15 1250  BP: 120/78  Pulse: 70  Resp: 16   General Examination: The patient is a very pleasant 64 y.o. female in no acute distress. She appears well-developed and well-nourished and well groomed. She wears a head scarf. She is in good spirits today.   HEENT: Normocephalic, atraumatic, pupils are equal, round and reactive to light and accommodation. Funduscopic exam  is normal with sharp disc margins noted. She has mild b/l cataracts. Extraocular tracking is good without limitation to gaze excursion or nystagmus noted. Normal smooth pursuit is noted. Hearing is grossly intact. Face is asymmetric with mild left lower facial weakness noted, unchanged. Speech is clear. She has no significant dysarthria. She has decreased sensation to all modalities on the left face. Oropharynx exam reveals: moderate mouth dryness, adequate dental hygiene and mild airway crowding.   Chest: Clear to auscultation without wheezing, rhonchi or crackles noted.  Heart: S1+S2+0, regular and normal without murmurs, rubs or gallops noted.   Abdomen: Soft, non-tender and non-distended with normal bowel sounds appreciated on auscultation.  Extremities: There is trace pitting edema in the distal lower extremities bilaterally. Pedal pulses are intact.  Skin: Warm and dry without trophic changes noted. There are no varicose veins.  Musculoskeletal: exam reveals no obvious joint deformities, tenderness or joint swelling or erythema.   Neurologically:  Mental status: The patient is awake, alert and oriented in all 4 spheres. Her immediate and remote memory, attention, language skills and fund of knowledge are appropriate. There is no evidence of aphasia, agnosia, apraxia or anomia. Speech is clear with normal prosody and enunciation. Thought process is linear. Mood is normal and affect is normal.  Cranial nerves II - XII are as described above under HEENT exam. In addition: shoulder shrug is normal with equal shoulder height noted. Motor exam: She has an overall fairly thin bulk. Strength is normal on the right. She has 4 out of 5 weakness on the left. She has mild twitching in the LUE. She has a coarse intention tremor on the left and no resting tremor. She has difficulty with fine motor skills and has involuntary movements in the left upper extremity in particular with action. Sensory exam shows  decreased all modalities on the left hemibody. Reflexes are mildly hyperactive on the left and normal on the right. She has mild difficulty standing and walks with a slight limp on the left. She has mild circumduction on the L. She can walk some without her cane. She cannot do tandem walk. Romberg shows mild sway, all stable.   Assessment and Plan:  In summary, GUADALUPE KEREKES is a very pleasant 64 year old female with an underlying complex medical history of hypertension, reflux disease, hyperactive bladder, arthritis, hyperlipidemia, depression, and previous stroke in 2007 with left-sided weakness, aortic insufficiency, followed by cardiology, who reports left-sided pain in her face, as well as the left hemibody, with  Improvement of her symptoms on gabapentin 300 mg 3 times a day. Her exam is stable , and in keeping with residual left hemiparesis and left hemibody sensory loss as well as involuntary movements of her left upper extremity.  She has left hemibody pain. This is in light of her hemorrhagic thalamic stroke on the right side in keeping with thalamic pain syndrome. I explained to the patient that her involuntary movements are likely secondary to the stroke as well. These can be very difficult to treat and the pain can also be resistant to treatment. Nevertheless, she has had some success with  Gabapentin 300 mg 3 times a day. I would like to keep her at this dose. We have gradually increased in the recent past. She is able to tolerate this. She requested a form to be filled out so she  Receive assistance from the sitting to get her trashcan pulled out and placed back in place. I filled out her form.  I renewed her gabapentin prescription. I would like to see her back in 6 months, sooner if the need arises.  I answered all her questions today and she was in agreement.  I spent 15 minutes in total face-to-face time with the patient, more than 50% of which was spent in counseling and coordination of  care, reviewing test results, reviewing medication and discussing or reviewing the diagnosis of thalamic stroke and residual weakness secondary to thalamic stroke and thalamic pain syndrome, its prognosis and treatment options.

## 2015-08-14 NOTE — Patient Instructions (Signed)
We will continue with gabapentin 300 mg three times a day.

## 2015-09-29 ENCOUNTER — Telehealth: Payer: Medicare Other | Admitting: Neurology

## 2015-09-29 DIAGNOSIS — G8194 Hemiplegia, unspecified affecting left nondominant side: Secondary | ICD-10-CM

## 2015-09-29 DIAGNOSIS — I619 Nontraumatic intracerebral hemorrhage, unspecified: Secondary | ICD-10-CM

## 2015-09-29 DIAGNOSIS — R259 Unspecified abnormal involuntary movements: Secondary | ICD-10-CM

## 2015-09-29 DIAGNOSIS — G890 Central pain syndrome: Secondary | ICD-10-CM

## 2015-09-29 NOTE — Telephone Encounter (Signed)
Patient called again about the form for Chelsea [name] and wanted to know if these have been sent.  She requested a call back.

## 2015-09-29 NOTE — Telephone Encounter (Signed)
Pt called inquiring if form from [employer] has been received for rolator. Please call and advise at [phone]

## 2015-09-30 ENCOUNTER — Telehealth: Payer: Self-pay | Admitting: Neurology

## 2015-09-30 NOTE — Telephone Encounter (Signed)
I spoke to patient and advised her that her insurance requires very specific information which we are trying to find in her chart. I stated that we are working on it.

## 2015-09-30 NOTE — Telephone Encounter (Signed)
Patient called back wanting to know if form had been sent to [facility] Patient states she "has arthritis in back real bad, when she walks or stands it hurts real bad, problem with left knee, when I walk, I'm about to fall, I had stroke that affected the whole left side, burning in left side, walks with cane, cane is not holding her balance, she almost falls or falls sometimes, I have to let the cane go so I can catch my balance. If I had the rollator, I could just lock it in, sit down until I catch my balance and pain subsides a little and then be able to get back up and walk again".

## 2015-09-30 NOTE — Telephone Encounter (Signed)
I spoke to patient and let her know that we just received the form today. We will fill out and fax in today.

## 2015-09-30 NOTE — Telephone Encounter (Signed)
Pt returned Diana's call. I relayed message to pt. She understood.

## 2015-09-30 NOTE — Telephone Encounter (Signed)
Left message on vm that we have sent order to [facility].

## 2015-09-30 NOTE — Telephone Encounter (Signed)
Patient called back and stated that [facility] says that all they need is a prescription sent to them and they will take care of everything.

## 2015-09-30 NOTE — Telephone Encounter (Signed)
I called patient back and let her know I have already sent the order in. I advised her that it will take a little bit before they process it.

## 2015-09-30 NOTE — Addendum Note (Signed)
Addended by: Laurence Spates on: 09/30/2015 03:48 PM   Modules accepted: Orders

## 2015-09-30 NOTE — Telephone Encounter (Signed)
Melissa/[facility] [phone] called regarding request for Rollator. Needs more information for [facility] medical necessity for Rollator. Notes that were received didn't have enough information to support Rollator. Melissa states patient calls approximately three times a day.

## 2015-09-30 NOTE — Telephone Encounter (Signed)
Patient returned call. Got message from nurse that form was sent to [facility]. Patient wants to know if it was sent today or what's going on with it. 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 Patient states she called [facility] between 4:00-4:30pm and they had not received form.

## 2015-10-01 NOTE — Telephone Encounter (Signed)
Pt called inquiring if referral was sent. I relayed message Beverlee Nims noted this morning. Advised pt to wait a couple more days. Pt understood.

## 2015-10-01 NOTE — Telephone Encounter (Signed)
Order has been sent via [facility]. I advised patient yesterday that it may take a couple of days to process.

## 2015-10-01 NOTE — Telephone Encounter (Signed)
Patient called back. States [facility] thinks order may have been faxed to the wrong #, [facility] has never received the order. Resend to fax# [phone].

## 2015-10-02 ENCOUNTER — Other Ambulatory Visit: Payer: Self-pay

## 2015-10-02 ENCOUNTER — Telehealth: Payer: Self-pay | Admitting: Neurology

## 2015-10-02 DIAGNOSIS — R259 Unspecified abnormal involuntary movements: Secondary | ICD-10-CM

## 2015-10-02 DIAGNOSIS — G8194 Hemiplegia, unspecified affecting left nondominant side: Secondary | ICD-10-CM

## 2015-10-02 DIAGNOSIS — G890 Central pain syndrome: Secondary | ICD-10-CM

## 2015-10-02 DIAGNOSIS — I619 Nontraumatic intracerebral hemorrhage, unspecified: Secondary | ICD-10-CM

## 2015-10-02 NOTE — Telephone Encounter (Signed)
Please see other phone message for further.

## 2015-10-02 NOTE — Telephone Encounter (Signed)
Patient called wanting to know when the Rx for the Rollator will be faxed to [facility]. Corene Cornea from [facility] called her and said that we sent the wrong Rx over. Will it be today or tomorrow?

## 2015-10-02 NOTE — Telephone Encounter (Signed)
I am in contact with rep at [facility] to make sure the order is right. I put in an order for the same "rollator walker" (same equipment code) that she was trying to get from the original company she was using.

## 2015-10-02 NOTE — Telephone Encounter (Signed)
Chelsea [name] at 10/02/2015 4:19 PM     Status: Signed       Expand All Collapse All   Pt called and said that [facility] called her and said the rx was sent for a walker instead of a rollator walker. Can you please resend a correct rx.

## 2015-10-02 NOTE — Telephone Encounter (Signed)
Please see other  Message for further

## 2015-10-02 NOTE — Telephone Encounter (Signed)
Pt called and said that [facility] called her and said the rx was sent for a walker instead of a rollator walker.  Can you please resend a correct rx.

## 2015-10-06 NOTE — Telephone Encounter (Signed)
[facility] has advised me that they have the complete order and will process.

## 2015-10-08 DIAGNOSIS — I69952 Hemiplegia and hemiparesis following unspecified cerebrovascular disease affecting left dominant side: Secondary | ICD-10-CM | POA: Diagnosis not present

## 2015-10-08 DIAGNOSIS — N3281 Overactive bladder: Secondary | ICD-10-CM | POA: Diagnosis not present

## 2015-10-08 DIAGNOSIS — I10 Essential (primary) hypertension: Secondary | ICD-10-CM | POA: Diagnosis not present

## 2015-10-08 DIAGNOSIS — F064 Anxiety disorder due to known physiological condition: Secondary | ICD-10-CM | POA: Diagnosis not present

## 2015-10-13 ENCOUNTER — Ambulatory Visit: Payer: Commercial Managed Care - HMO | Admitting: Dietician

## 2015-10-16 DIAGNOSIS — M25562 Pain in left knee: Secondary | ICD-10-CM | POA: Diagnosis not present

## 2015-10-16 DIAGNOSIS — M25462 Effusion, left knee: Secondary | ICD-10-CM | POA: Diagnosis not present

## 2015-10-21 DIAGNOSIS — M791 Myalgia: Secondary | ICD-10-CM | POA: Diagnosis not present

## 2015-10-27 ENCOUNTER — Other Ambulatory Visit: Payer: Self-pay

## 2015-10-27 DIAGNOSIS — Z1231 Encounter for screening mammogram for malignant neoplasm of breast: Secondary | ICD-10-CM

## 2015-10-28 ENCOUNTER — Telehealth: Payer: Self-pay | Admitting: Neurology

## 2015-10-28 ENCOUNTER — Ambulatory Visit: Payer: 59 | Admitting: Dietician

## 2015-10-28 NOTE — Telephone Encounter (Signed)
Patient called to request refill of gabapentin (NEURONTIN) 300 MG capsule to be sent to [facility] on [address], [place].

## 2015-10-28 NOTE — Telephone Encounter (Signed)
It appears a 6 month Rx for Gabapentin was sent to [facility] in Sept.  I called the pharmacy ans spoke with the pharmacist who said the patient has several refills remaining.  I called the patient back.  She is aware.

## 2015-11-03 ENCOUNTER — Telehealth: Payer: Self-pay | Admitting: Neurology

## 2015-11-03 NOTE — Telephone Encounter (Signed)
Pt called requesting to make payment on old balance. Please call and advise.

## 2015-11-04 DIAGNOSIS — M7122 Synovial cyst of popliteal space [Baker], left knee: Secondary | ICD-10-CM | POA: Diagnosis not present

## 2015-11-04 DIAGNOSIS — Z6839 Body mass index (BMI) 39.0-39.9, adult: Secondary | ICD-10-CM | POA: Diagnosis not present

## 2015-11-04 DIAGNOSIS — E785 Hyperlipidemia, unspecified: Secondary | ICD-10-CM | POA: Diagnosis not present

## 2015-11-10 DIAGNOSIS — M25562 Pain in left knee: Secondary | ICD-10-CM | POA: Diagnosis not present

## 2015-11-13 ENCOUNTER — Encounter: Payer: Self-pay | Admitting: Dietician

## 2015-11-13 ENCOUNTER — Encounter: Payer: Medicaid Other | Attending: Family Medicine | Admitting: Dietician

## 2015-11-13 VITALS — Ht 66.0 in | Wt 220.6 lb

## 2015-11-13 DIAGNOSIS — E669 Obesity, unspecified: Secondary | ICD-10-CM | POA: Insufficient documentation

## 2015-11-13 DIAGNOSIS — Z6835 Body mass index (BMI) 35.0-35.9, adult: Secondary | ICD-10-CM | POA: Diagnosis not present

## 2015-11-13 DIAGNOSIS — Z713 Dietary counseling and surveillance: Secondary | ICD-10-CM | POA: Insufficient documentation

## 2015-11-13 NOTE — Patient Instructions (Addendum)
Aim to eat at least 3 x day. Have protein and carbs with each meal and snack. (egg and fruit for breakfast) Try having one starch per meal (bread, potatoes, rice, noodles, corn, yams, and peas). Use just a small amount of mayo on sandwich. Aim to fill half of your plate with vegetables, have a quarter of your plate protein and a quarter of your plate starch/fruit. For lunch, have potato, chicken, and carrots. For dinner, have broccoli, lettuce, fish, and rice. Try Diet V8 Splash or Diet Cranberry Ocean Spray (5 calories) juice. Try to use a small plate to help with portion sizes again. Continue to eat meals and snacks without the TV on to help notice fullness feelings. Continue to exercise most days.  Look for whole wheat flour at the grocery store.

## 2015-11-13 NOTE — Progress Notes (Signed)
  Medical Nutrition Therapy:  Appt start time: [id number] end time:  1625   Assessment:  Primary concerns today: Chelsea [name] is here today for a follow up to try to lose weight with a 2 lbs weight loss.  May have to have knee surgery soon but is scared to have surgery. Felt like she was track for a short time and then went back to old habits - large portions and "junk food". Started feeling depressed and started skipping and breakfast and lunch and was having a large dinner.   Preferred Learning Style:   No preference indicated   Learning Readiness:   Ready  MEDICATIONS: see list   DIETARY INTAKE:  Usual eating pattern includes 2  meals and 1 snacks per day.  Avoided foods include mushrooms, okra    24-hr recall:  B ( AM):none  Snk ( AM): none  L (12 PM): none or bologna sandwich with mayo and juice Snk ( PM): tangerine D ( PM): spaghetti with fruit, chicken with zucchini and onions, hamburger (no bun) with rice and cornbread Snk ( PM): cake  Beverages: water, 2 cups juice, 1/2 bottle Powerade Zero, coffee sometimes with stevia  Usual physical activity: some walking (not consistent). Cardio cruiser every day for 45 minutes   Estimated energy needs: 1600 calories 180 g carbohydrates 120 g protein 44 g fat  Progress Towards Goal(s):  In progress.   Nutritional Diagnosis:  Chelsea [name]-3.3 Overweight/obesity As related to hx of energy dense food choices and sugary beverages.  As evidenced by BMI of 35.9.    Intervention:  Nutrition counseling provided. Plan: Aim to eat at least 3 x day. Have protein and carbs with each meal and snack. (egg and fruit for breakfast) Try having one starch per meal (bread, potatoes, rice, noodles, corn, yams, and peas). Use just a small amount of mayo on sandwich. Aim to fill half of your plate with vegetables, have a quarter of your plate protein and a quarter of your plate starch/fruit. For lunch, have potato, chicken, and carrots. For dinner, have broccoli,  lettuce, fish, and rice. Try Diet V8 Splash or Diet Cranberry Ocean Spray (5 calories) juice. Try to use a small plate to help with portion sizes again. Continue to eat meals and snacks without the TV on to help notice fullness feelings. Continue to exercise most days.  Look for whole wheat flour at the grocery store.   Teaching Method Utilized:  Visual Auditory Hands on  Handouts given during visit include:  MyPlate Handout  Meal Plan  Barriers to learning/adherence to lifestyle change: limited income, limited mobility  Demonstrated degree of understanding via:  Teach Back   Monitoring/Evaluation:  Dietary intake, exercise, and body weight in 4 month(s).

## 2015-11-17 DIAGNOSIS — M25562 Pain in left knee: Secondary | ICD-10-CM | POA: Diagnosis not present

## 2015-11-28 ENCOUNTER — Ambulatory Visit
Admission: RE | Admit: 2015-11-28 | Discharge: 2015-11-28 | Disposition: A | Discharge status: Home / Self Care | Payer: Medicare Other | Source: Ambulatory Visit

## 2015-11-28 DIAGNOSIS — Z1231 Encounter for screening mammogram for malignant neoplasm of breast: Secondary | ICD-10-CM

## 2015-12-03 DIAGNOSIS — M1712 Unilateral primary osteoarthritis, left knee: Secondary | ICD-10-CM | POA: Diagnosis not present

## 2015-12-16 ENCOUNTER — Other Ambulatory Visit: Payer: Self-pay | Admitting: Orthopedic Surgery

## 2015-12-25 ENCOUNTER — Encounter (HOSPITAL_COMMUNITY)
Admission: RE | Admit: 2015-12-25 | Discharge: 2015-12-25 | Disposition: A | Discharge status: Home / Self Care | Payer: Medicare Other | Source: Ambulatory Visit | Attending: Orthopedic Surgery | Admitting: Orthopedic Surgery

## 2015-12-25 ENCOUNTER — Encounter (HOSPITAL_COMMUNITY): Payer: Self-pay

## 2015-12-25 ENCOUNTER — Telehealth: Payer: Self-pay | Admitting: Cardiology

## 2015-12-25 DIAGNOSIS — I10 Essential (primary) hypertension: Secondary | ICD-10-CM | POA: Diagnosis not present

## 2015-12-25 DIAGNOSIS — Z79899 Other long term (current) drug therapy: Secondary | ICD-10-CM | POA: Insufficient documentation

## 2015-12-25 DIAGNOSIS — E785 Hyperlipidemia, unspecified: Secondary | ICD-10-CM | POA: Diagnosis not present

## 2015-12-25 DIAGNOSIS — Z0183 Encounter for blood typing: Secondary | ICD-10-CM | POA: Diagnosis not present

## 2015-12-25 DIAGNOSIS — Z7982 Long term (current) use of aspirin: Secondary | ICD-10-CM | POA: Insufficient documentation

## 2015-12-25 DIAGNOSIS — Z01818 Encounter for other preprocedural examination: Secondary | ICD-10-CM | POA: Insufficient documentation

## 2015-12-25 DIAGNOSIS — M1712 Unilateral primary osteoarthritis, left knee: Secondary | ICD-10-CM | POA: Insufficient documentation

## 2015-12-25 DIAGNOSIS — Z01811 Encounter for preprocedural respiratory examination: Secondary | ICD-10-CM | POA: Diagnosis not present

## 2015-12-25 DIAGNOSIS — Z01812 Encounter for preprocedural laboratory examination: Secondary | ICD-10-CM | POA: Insufficient documentation

## 2015-12-25 DIAGNOSIS — I351 Nonrheumatic aortic (valve) insufficiency: Secondary | ICD-10-CM | POA: Diagnosis not present

## 2015-12-25 DIAGNOSIS — K219 Gastro-esophageal reflux disease without esophagitis: Secondary | ICD-10-CM | POA: Diagnosis not present

## 2015-12-25 DIAGNOSIS — Z8673 Personal history of transient ischemic attack (TIA), and cerebral infarction without residual deficits: Secondary | ICD-10-CM | POA: Diagnosis not present

## 2015-12-25 HISTORY — DX: Gastro-esophageal reflux disease without esophagitis: K21.9

## 2015-12-25 HISTORY — DX: Headache, unspecified: R51.9

## 2015-12-25 HISTORY — DX: Anxiety disorder, unspecified: F41.9

## 2015-12-25 HISTORY — DX: Headache: R51

## 2015-12-25 HISTORY — DX: Major depressive disorder, single episode, unspecified: F32.9

## 2015-12-25 HISTORY — DX: Depression, unspecified: F32.A

## 2015-12-25 LAB — CBC WITH DIFFERENTIAL/PLATELET
Basophils Absolute: 0 10*3/uL (ref 0.0–0.1)
Basophils Relative: 0 %
Eosinophils Absolute: 0.2 10*3/uL (ref 0.0–0.7)
Eosinophils Relative: 4 %
HCT: 43.6 % (ref 36.0–46.0)
Hemoglobin: 13.7 g/dL (ref 12.0–15.0)
Lymphocytes Relative: 27 %
Lymphs Abs: 1.3 10*3/uL (ref 0.7–4.0)
MCH: 28.2 pg (ref 26.0–34.0)
MCHC: 31.4 g/dL (ref 30.0–36.0)
MCV: 89.7 fL (ref 78.0–100.0)
Monocytes Absolute: 0.3 10*3/uL (ref 0.1–1.0)
Monocytes Relative: 7 %
Neutro Abs: 3.1 10*3/uL (ref 1.7–7.7)
Neutrophils Relative %: 62 %
Platelets: 299 10*3/uL (ref 150–400)
RBC: 4.86 MIL/uL (ref 3.87–5.11)
RDW: 15 % (ref 11.5–15.5)
WBC: 5 10*3/uL (ref 4.0–10.5)

## 2015-12-25 LAB — APTT: aPTT: 31 seconds (ref 24–37)

## 2015-12-25 LAB — URINALYSIS, ROUTINE W REFLEX MICROSCOPIC
Bilirubin Urine: NEGATIVE
Glucose, UA: NEGATIVE mg/dL
Ketones, ur: NEGATIVE mg/dL
Nitrite: NEGATIVE
Protein, ur: NEGATIVE mg/dL
Specific Gravity, Urine: 1.02 (ref 1.005–1.030)
pH: 5 (ref 5.0–8.0)

## 2015-12-25 LAB — BASIC METABOLIC PANEL
Anion gap: 9 (ref 5–15)
BUN: 15 mg/dL (ref 6–20)
CO2: 29 mmol/L (ref 22–32)
Calcium: 9.7 mg/dL (ref 8.9–10.3)
Chloride: 105 mmol/L (ref 101–111)
Creatinine, Ser: 1.12 mg/dL — ABNORMAL HIGH (ref 0.44–1.00)
GFR calc Af Amer: 59 mL/min — ABNORMAL LOW (ref >60)
GFR calc non Af Amer: 51 mL/min — ABNORMAL LOW (ref >60)
Glucose, Bld: 89 mg/dL (ref 65–99)
Potassium: 3.9 mmol/L (ref 3.5–5.1)
Sodium: 143 mmol/L (ref 135–145)

## 2015-12-25 LAB — PROTIME-INR
INR: 1.04 (ref 0.00–1.49)
Prothrombin Time: 13.8 seconds (ref 11.6–15.2)

## 2015-12-25 LAB — TYPE AND SCREEN
ABO/RH(D): O POS
Antibody Screen: NEGATIVE

## 2015-12-25 LAB — ABO/RH: ABO/RH(D): O POS

## 2015-12-25 LAB — URINE MICROSCOPIC-ADD ON

## 2015-12-25 LAB — SURGICAL PCR SCREEN
MRSA, PCR: NEGATIVE
Staphylococcus aureus: NEGATIVE

## 2015-12-25 NOTE — Telephone Encounter (Signed)
Pt just left [facility]  And they told her to notify Dr Aundra Dubin that she is having a knee replacement on 01-05-16.

## 2015-12-25 NOTE — Pre-Procedure Instructions (Signed)
    Chelsea [name]  12/25/2015      [name], [name] [address] Phone: [phone] Fax: [phone]  [name], [name] Phone: [phone] Fax: [phone]  [name] - [place], [place] [address] AT [address] [place] [address] Phone: [phone] Fax: [phone]    Your procedure is scheduled on 01/05/16.  Report to [facility] Admitting at 8 A.M.  Call this number if you have problems the morning of surgery:  [phone]   Remember:  Do not eat food or drink liquids after midnight.  Take these medicines the morning of surgery with A SIP OF WATER --amlodipine,celexa,clondine,cardura,neurontin,prilosec,hydrocodone   Do not wear jewelry, make-up or nail polish.  Do not wear lotions, powders, or perfumes.  You may wear deodorant.  Do not shave 48 hours prior to surgery.  Men may shave face and neck.  Do not bring valuables to the hospital.  [facility] is not responsible for any belongings or valuables.  Contacts, dentures or bridgework may not be worn into surgery.  Leave your suitcase in the car.  After surgery it may be brought to your room.  For patients admitted to the hospital, discharge time will be determined by your treatment team.  Patients discharged the day of surgery will not be allowed to drive home.   Name and phone number of your driver:   Special instructions:   Please read over the following fact sheets that you were given. Pain Booklet, Coughing and Deep Breathing, Blood Transfusion Information, MRSA Information and Surgical Site Infection Prevention

## 2015-12-25 NOTE — Progress Notes (Signed)
Pt has not informed Dr Aundra [name] about up coming surgery. But does plan to. I will also f/u with Chelsea [name]

## 2015-12-26 ENCOUNTER — Telehealth: Payer: Self-pay | Admitting: Cardiology

## 2015-12-26 DIAGNOSIS — I351 Nonrheumatic aortic (valve) insufficiency: Secondary | ICD-10-CM

## 2015-12-26 NOTE — Telephone Encounter (Signed)
As long as symptoms are stable, would get echo to followup on aortic insufficiency pre-surgery. As long as this is not severe, she may proceed with surgery.

## 2015-12-26 NOTE — Telephone Encounter (Signed)
LMTCB

## 2015-12-26 NOTE — Telephone Encounter (Signed)
Chelsea [name] is returning your call .

## 2015-12-26 NOTE — Telephone Encounter (Signed)
Request for surgical clearance:  1. What type of surgery is being performed?  lft total knee    2. When is this surgery scheduled? Feb 6-   3. Are there any medications that need to be held prior to surgery and how long? n/a   4. Name of physician performing surgery? Dr Frederik Pear   5. What is your office phone and fax number? Fax- [phone].

## 2015-12-26 NOTE — Telephone Encounter (Signed)
Pt states she was told to notify Dr Aundra Dubin that she was having knee replacement 01/05/16, she does not think they are requesting cardiac clearance from Dr Aundra Dubin Pt advised that if surgeon is requesting cardiac clearance for surgery by Dr Aundra Dubin, the surgeon's office should contact Dr Aundra Dubin with name of surgery and if the surgeon has any requests for holding medications, etc.  Pt verbalized understanding.

## 2015-12-26 NOTE — Telephone Encounter (Signed)
LMTCB to let pt know surgeon's office will need to contact our office if the surgeon is requesting  surgical clearance from Dr Chelsea [name].

## 2015-12-29 ENCOUNTER — Other Ambulatory Visit: Payer: Self-pay | Admitting: Orthopedic Surgery

## 2015-12-29 NOTE — Telephone Encounter (Signed)
Per Ebony in echo scheduling, first echo appt 01/08/16. Charlena Cross states she will check with Lorriane Shire tomorrow to see if echo can be scheduled ASAP so pt can have prior to knee surgery for Dr Aundra Dubin to be able to clear for knee surgery.  Pt advised it will be tomorrow before I will know about echo scheduling, pt states she will contact Dr Damita Dunnings office to let him know.

## 2015-12-29 NOTE — Telephone Encounter (Signed)
LMTCB

## 2015-12-29 NOTE — Telephone Encounter (Signed)
Returning your call. °

## 2015-12-29 NOTE — Telephone Encounter (Signed)
Pt asked me to notify Caralyn Guile at Dr Damita Dunnings office once I know about timing of echocardiogram.

## 2015-12-30 DIAGNOSIS — R739 Hyperglycemia, unspecified: Secondary | ICD-10-CM | POA: Diagnosis not present

## 2015-12-30 DIAGNOSIS — B664 Paragonimiasis: Secondary | ICD-10-CM | POA: Diagnosis not present

## 2015-12-30 DIAGNOSIS — I69952 Hemiplegia and hemiparesis following unspecified cerebrovascular disease affecting left dominant side: Secondary | ICD-10-CM | POA: Diagnosis not present

## 2015-12-30 DIAGNOSIS — N760 Acute vaginitis: Secondary | ICD-10-CM | POA: Diagnosis not present

## 2015-12-30 DIAGNOSIS — N3281 Overactive bladder: Secondary | ICD-10-CM | POA: Diagnosis not present

## 2015-12-30 DIAGNOSIS — N390 Urinary tract infection, site not specified: Secondary | ICD-10-CM | POA: Diagnosis not present

## 2015-12-30 NOTE — Telephone Encounter (Signed)
Echo scheduled for 12/30/15 at 11:30AM, pt advised.

## 2015-12-30 NOTE — Telephone Encounter (Signed)
Please call.

## 2015-12-30 NOTE — Progress Notes (Addendum)
Anesthesia Chart Review:  Pt is a 65 year old female scheduled for L total knee arthroplasty on 01/05/2016 with Dr. Mayer Camel.   Cardiologist is Dr. Loralie Champagne.   PMH includes:  HTN, aortic insufficiency, hyperlipidemia, CVA (2007), GERD. Never smoker. BMI 35.   Medications include: amlodipine-valsartan, ASA, lipitor, clonidine, doxazosin, zetia, lasix, prilosec.   Preoperative labs reviewed.  UA consistent with UTI. Left message for Juliann Pulse in Dr. Damita Dunnings office.   Chest x-ray 12/25/15 reviewed. No active cardiopulmonary disease.   EKG 03/15/15: NSR. Moderate voltage criteria for LVH, may be normal variant. Nonspecific T wave abnormality.   Pt scheduled for echo 12/31/15. Per Dr. Aundra Dubin, if aortic insufficiency stable, pt can proceed with surgery (see Epic telephone encounter dated 12/26/15). Will revisit chart when results available.   Willeen Cass, FNP-BC [facility] Short Stay Surgical Center/Anesthesiology Phone: [phone] 12/30/2015 1:20 PM  Addendum:   Echo 12/31/15:  - Left ventricle: The cavity size was mildly dilated. Wall thickness was increased in a pattern of mild LVH. Systolic function was normal. The estimated ejection fraction was in the range of 55% to 60%. Wall motion was normal; there were no regional wall motion abnormalities. Doppler parameters are consistent with abnormal left ventricular relaxation (grade 1 diastolic dysfunction). Doppler parameters are consistent with elevated ventricular end-diastolic filling pressure. - Aortic valve: There was mild regurgitation. Mean gradient (S): 7 mm Hg. Peak gradient (S): 15 mm Hg. Valve area (VTI): 2.63 cm^2. Valve area (Vmax): 2.45 cm^2. Valve area (Vmean): 2.75 cm^2. - Mitral valve: There was mild regurgitation. - Left atrium: The atrium was mildly dilated. - Atrial septum: No defect or patent foramen ovale was identified.  Dr. Aundra Dubin has cleared pt for surgery.  If no changes, I anticipate pt can proceed with surgery as scheduled.    Willeen Cass, FNP-BC [facility] Short Stay Surgical Center/Anesthesiology Phone: [phone] 01/02/2016 9:11 AM

## 2015-12-31 ENCOUNTER — Ambulatory Visit (HOSPITAL_COMMUNITY): Payer: Medicare Other | Attending: Cardiovascular Disease

## 2015-12-31 ENCOUNTER — Other Ambulatory Visit: Payer: Self-pay

## 2015-12-31 DIAGNOSIS — Z6834 Body mass index (BMI) 34.0-34.9, adult: Secondary | ICD-10-CM | POA: Diagnosis not present

## 2015-12-31 DIAGNOSIS — I351 Nonrheumatic aortic (valve) insufficiency: Secondary | ICD-10-CM | POA: Diagnosis not present

## 2015-12-31 DIAGNOSIS — I10 Essential (primary) hypertension: Secondary | ICD-10-CM | POA: Diagnosis not present

## 2015-12-31 DIAGNOSIS — E785 Hyperlipidemia, unspecified: Secondary | ICD-10-CM | POA: Diagnosis not present

## 2015-12-31 DIAGNOSIS — I340 Nonrheumatic mitral (valve) insufficiency: Secondary | ICD-10-CM | POA: Diagnosis not present

## 2015-12-31 DIAGNOSIS — E669 Obesity, unspecified: Secondary | ICD-10-CM | POA: Insufficient documentation

## 2015-12-31 DIAGNOSIS — I359 Nonrheumatic aortic valve disorder, unspecified: Secondary | ICD-10-CM | POA: Diagnosis present

## 2015-12-31 DIAGNOSIS — I517 Cardiomegaly: Secondary | ICD-10-CM | POA: Insufficient documentation

## 2016-01-01 ENCOUNTER — Telehealth: Payer: Self-pay | Admitting: Cardiology

## 2016-01-01 NOTE — Telephone Encounter (Signed)
See phone note 12/26/15

## 2016-01-01 NOTE — Telephone Encounter (Signed)
New message     patient calling checking on the status cardiac clearance

## 2016-01-01 NOTE — Telephone Encounter (Signed)
LM for [facility] at Dr Damita Dunnings office [phone] Dr Aundra Dubin has  given cardiac clearance for surgery

## 2016-01-01 NOTE — Telephone Encounter (Signed)
Pt given echo results 

## 2016-01-01 NOTE — Telephone Encounter (Signed)
Pt advised Dr Aundra Dubin has cleared her for surgery.

## 2016-01-01 NOTE — Telephone Encounter (Signed)
Reviewed echo, looks ok.  Should be of reasonable risk to undergo surgery.

## 2016-01-02 NOTE — H&P (Signed)
TOTAL KNEE ADMISSION H&P  Patient is being admitted for left total knee arthroplasty.  Subjective:  Chief Complaint:left knee pain.  HPI: Chelsea [name], 65 y.o. female, has a history of pain and functional disability in the left knee due to arthritis and has failed non-surgical conservative treatments for greater than 12 weeks to includeNSAID's and/or analgesics, corticosteriod injections, flexibility and strengthening excercises, weight reduction as appropriate and activity modification.  Onset of symptoms was gradual, starting 3 years ago with gradually worsening course since that time. The patient noted no past surgery on the left knee(s).  Patient currently rates pain in the left knee(s) at 10 out of 10 with activity. Patient has night pain, worsening of pain with activity and weight bearing, pain that interferes with activities of daily living, pain with passive range of motion, crepitus and joint swelling.  Patient has evidence of subchondral sclerosis and joint space narrowing by imaging studies. There is no active infection.  Patient Active Problem List   Diagnosis Date Noted  . Mild to Moderate Aortic Insufficiency   . Hypertension   . Hyperlipidemia   . Morbid obesity ([place])   . Pulmonary hypertension ([place]) 01/30/2013  . Orthostatic hypotension 12/18/2012  . Aortic valve disorder 07/29/2010  . CHEST PAIN-PRECORDIAL 07/29/2010  . CYST OF THYROID 01/06/2010  . HYPOKALEMIA 01/06/2010  . HYPERLIPIDEMIA 12/24/2009  . OBESITY 12/24/2009  . HYPERTENSION, UNSPECIFIED 12/24/2009  . Personal history of unspecified circulatory disease 12/24/2009  . PEPTIC ULCER DISEASE, HX OF 12/24/2009   Past Medical History  Diagnosis Date  . Hypertension   . Hyperlipidemia   . Cerebrovascular accident [facility])     a. 2007-->residual left sided weakness  . Chest pain   . Obesity   . Peptic ulcer disease   . Hx of hysterectomy   . Mild to Moderate Aortic Insufficiency     a. 08/2014 Echo: EF 55-60%,  mild conc LVH, no rwma, Gr 1 DD, mild-mod AI, mild MR.  . Morbid obesity ([place])   . Depression   . Anxiety   . Headache   . GERD (gastroesophageal reflux disease)     Past Surgical History  Procedure Laterality Date  . Tubal ligation    . Abdominal hysterectomy      No prescriptions prior to admission   No Known Allergies  Social History  Substance Use Topics  . Smoking status: Never Smoker   . Smokeless tobacco: Never Used  . Alcohol Use: No    Family History  Problem Relation Age of Onset  . Hypertension Mother     deceased  . Peripheral vascular disease Mother   . Cirrhosis Father     DICEASED  . Heart attack Other     grandmother deceased     Review of Systems  Constitutional: Negative.   HENT: Negative.   Eyes:       Glasses  Respiratory: Negative.   Cardiovascular:       HTN and hx of stroke  Gastrointestinal: Negative.   Genitourinary: Negative.   Musculoskeletal: Positive for joint pain.  Skin: Negative.   Neurological: Negative.   Psychiatric/Behavioral: Negative.     Objective:  Physical Exam  Constitutional: She is oriented to person, place, and time. She appears well-developed and well-nourished.  HENT:  Head: Normocephalic and atraumatic.  Eyes: Pupils are equal, round, and reactive to light.  Neck: Normal range of motion. Neck supple.  Cardiovascular: Intact distal pulses.   Respiratory: Effort normal.  Musculoskeletal:  Tender along the  lateral joint line of the left knee obvious 10 valgus deformity.  2+ effusion, collateral ligaments are stable.    Neurological: She is alert and oriented to person, place, and time.  Skin: Skin is warm and dry.  Psychiatric: She has a normal mood and affect. Her behavior is normal. Judgment and thought content normal.    Vital signs in last 24 hours:    Labs:   Estimated body mass index is 35.62 kg/(m^2) as calculated from the following:   Height as of 11/13/15: 5\' 6"  (1.676 m).   Weight as of  11/13/15: 100.064 kg (220 lb 9.6 oz).   Imaging Review Plain radiographs demonstrate bone-on-bone the lateral compartment and near bone-on-bone to the patellofemoral compartment.  An MRI scan was also accomplished showing bone-on-bone arthritic changes with marrow edema in the lateral femoral condyle and the lateral tibial plateau.  Assessment/Plan:  End stage arthritis, left knee   The patient history, physical examination, clinical judgment of the provider and imaging studies are consistent with end stage degenerative joint disease of the left knee(s) and total knee arthroplasty is deemed medically necessary. The treatment options including medical management, injection therapy arthroscopy and arthroplasty were discussed at length. The risks and benefits of total knee arthroplasty were presented and reviewed. The risks due to aseptic loosening, infection, stiffness, patella tracking problems, thromboembolic complications and other imponderables were discussed. The patient acknowledged the explanation, agreed to proceed with the plan and consent was signed. Patient is being admitted for inpatient treatment for surgery, pain control, PT, OT, prophylactic antibiotics, VTE prophylaxis, progressive ambulation and ADL's and discharge planning. The patient is planning to be discharged to skilled nursing facility

## 2016-01-03 DIAGNOSIS — M1712 Unilateral primary osteoarthritis, left knee: Secondary | ICD-10-CM | POA: Diagnosis present

## 2016-01-04 MED ORDER — CHLORHEXIDINE GLUCONATE 4 % EX LIQD: 60.0000 mL | Freq: Once | CUTANEOUS | Status: DC

## 2016-01-04 MED ORDER — TRANEXAMIC ACID 1000 MG/10ML IV SOLN
2000.0000 mg | Freq: Once | INTRAVENOUS | Status: AC
  Administered 2016-01-05: 2000 mg via TOPICAL
  Filled 2016-01-04: qty 20

## 2016-01-04 MED ORDER — DEXTROSE-NACL 5-0.45 % IV SOLN: INTRAVENOUS | Status: DC

## 2016-01-04 MED ORDER — CEFAZOLIN SODIUM 10 G IJ SOLR
3.0000 g | INTRAMUSCULAR | Status: AC
  Administered 2016-01-05: 3 g via INTRAVENOUS
  Filled 2016-01-04: qty 3000

## 2016-01-04 MED ORDER — BUPIVACAINE LIPOSOME 1.3 % IJ SUSP
20.0000 mL | Freq: Once | INTRAMUSCULAR | Status: AC
  Administered 2016-01-05: 20 mL
  Filled 2016-01-04: qty 20

## 2016-01-05 ENCOUNTER — Inpatient Hospital Stay (HOSPITAL_COMMUNITY): Payer: Medicare Other | Admitting: Emergency Medicine

## 2016-01-05 ENCOUNTER — Inpatient Hospital Stay (HOSPITAL_COMMUNITY)
Admission: RE | Admit: 2016-01-05 | Discharge: 2016-01-08 | DRG: 470 | Disposition: A | Discharge status: Home / Self Care | Payer: Medicare Other | Source: Ambulatory Visit | Attending: Orthopedic Surgery | Admitting: Orthopedic Surgery

## 2016-01-05 ENCOUNTER — Encounter (HOSPITAL_COMMUNITY)
Admission: RE | Disposition: A | Discharge status: Home / Self Care | Payer: Self-pay | Source: Ambulatory Visit | Attending: Orthopedic Surgery

## 2016-01-05 ENCOUNTER — Inpatient Hospital Stay (HOSPITAL_COMMUNITY): Payer: Medicare Other | Admitting: Certified Registered"

## 2016-01-05 DIAGNOSIS — D62 Acute posthemorrhagic anemia: Secondary | ICD-10-CM | POA: Diagnosis not present

## 2016-01-05 DIAGNOSIS — M1712 Unilateral primary osteoarthritis, left knee: Secondary | ICD-10-CM | POA: Diagnosis not present

## 2016-01-05 DIAGNOSIS — I10 Essential (primary) hypertension: Secondary | ICD-10-CM | POA: Diagnosis not present

## 2016-01-05 DIAGNOSIS — Z6835 Body mass index (BMI) 35.0-35.9, adult: Secondary | ICD-10-CM

## 2016-01-05 DIAGNOSIS — Z8711 Personal history of peptic ulcer disease: Secondary | ICD-10-CM | POA: Diagnosis not present

## 2016-01-05 DIAGNOSIS — I272 Other secondary pulmonary hypertension: Secondary | ICD-10-CM | POA: Diagnosis not present

## 2016-01-05 DIAGNOSIS — K219 Gastro-esophageal reflux disease without esophagitis: Secondary | ICD-10-CM | POA: Diagnosis not present

## 2016-01-05 DIAGNOSIS — E785 Hyperlipidemia, unspecified: Secondary | ICD-10-CM | POA: Diagnosis present

## 2016-01-05 DIAGNOSIS — F329 Major depressive disorder, single episode, unspecified: Secondary | ICD-10-CM | POA: Diagnosis present

## 2016-01-05 DIAGNOSIS — G8918 Other acute postprocedural pain: Secondary | ICD-10-CM | POA: Diagnosis not present

## 2016-01-05 DIAGNOSIS — M179 Osteoarthritis of knee, unspecified: Secondary | ICD-10-CM | POA: Diagnosis not present

## 2016-01-05 DIAGNOSIS — I69354 Hemiplegia and hemiparesis following cerebral infarction affecting left non-dominant side: Secondary | ICD-10-CM | POA: Diagnosis not present

## 2016-01-05 HISTORY — PX: TOTAL KNEE ARTHROPLASTY: SHX125

## 2016-01-05 SURGERY — ARTHROPLASTY, KNEE, TOTAL
Anesthesia: Spinal | Laterality: Left

## 2016-01-05 MED ORDER — DEXAMETHASONE SODIUM PHOSPHATE 10 MG/ML IJ SOLN
10.0000 mg | Freq: Once | INTRAMUSCULAR | Status: AC
  Administered 2016-01-06: 10 mg via INTRAVENOUS
  Filled 2016-01-05: qty 1

## 2016-01-05 MED ORDER — METHOCARBAMOL 500 MG PO TABS
500.0000 mg | ORAL_TABLET | Freq: Four times a day (QID) | ORAL | Status: DC | PRN
  Administered 2016-01-05 – 2016-01-08 (×5): 500 mg via ORAL
  Filled 2016-01-05 (×6): qty 1

## 2016-01-05 MED ORDER — BISACODYL 5 MG PO TBEC: 5.0000 mg | DELAYED_RELEASE_TABLET | Freq: Every day | ORAL | Status: DC | PRN

## 2016-01-05 MED ORDER — SODIUM CHLORIDE 0.9 % IV BOLUS (SEPSIS)
500.0000 mL | Freq: Once | INTRAVENOUS | Status: AC
  Administered 2016-01-05: 500 mL via INTRAVENOUS

## 2016-01-05 MED ORDER — ONDANSETRON HCL 4 MG/2ML IJ SOLN
INTRAMUSCULAR | Status: DC | PRN
  Administered 2016-01-05: 4 mg via INTRAVENOUS

## 2016-01-05 MED ORDER — ACETAMINOPHEN 325 MG PO TABS: 650.0000 mg | ORAL_TABLET | Freq: Four times a day (QID) | ORAL | Status: DC | PRN

## 2016-01-05 MED ORDER — PROPOFOL 10 MG/ML IV BOLUS
INTRAVENOUS | Status: AC
  Filled 2016-01-05: qty 20

## 2016-01-05 MED ORDER — EZETIMIBE 10 MG PO TABS
10.0000 mg | ORAL_TABLET | Freq: Every day | ORAL | Status: DC
  Administered 2016-01-05 – 2016-01-08 (×4): 10 mg via ORAL
  Filled 2016-01-05 (×4): qty 1

## 2016-01-05 MED ORDER — PHENYLEPHRINE HCL 10 MG/ML IJ SOLN
10.0000 mg | INTRAVENOUS | Status: DC | PRN
  Administered 2016-01-05: 5 ug/min via INTRAVENOUS

## 2016-01-05 MED ORDER — MEPERIDINE HCL 25 MG/ML IJ SOLN: 6.2500 mg | INTRAMUSCULAR | Status: DC | PRN

## 2016-01-05 MED ORDER — METHOCARBAMOL 1000 MG/10ML IJ SOLN
500.0000 mg | Freq: Four times a day (QID) | INTRAVENOUS | Status: DC | PRN
  Filled 2016-01-05: qty 5

## 2016-01-05 MED ORDER — IRBESARTAN 150 MG PO TABS
150.0000 mg | ORAL_TABLET | Freq: Every day | ORAL | Status: DC
  Administered 2016-01-06 – 2016-01-07 (×2): 150 mg via ORAL
  Filled 2016-01-05 (×2): qty 1

## 2016-01-05 MED ORDER — SODIUM CHLORIDE 0.9 % IJ SOLN
INTRAMUSCULAR | Status: DC | PRN
  Administered 2016-01-05: 40 mL via INTRAVENOUS

## 2016-01-05 MED ORDER — HYDROMORPHONE HCL 1 MG/ML IJ SOLN: 0.2500 mg | INTRAMUSCULAR | Status: DC | PRN

## 2016-01-05 MED ORDER — ONDANSETRON HCL 4 MG PO TABS: 4.0000 mg | ORAL_TABLET | Freq: Four times a day (QID) | ORAL | Status: DC | PRN

## 2016-01-05 MED ORDER — ASPIRIN EC 325 MG PO TBEC
325.0000 mg | DELAYED_RELEASE_TABLET | Freq: Every day | ORAL | Status: DC
  Administered 2016-01-06 – 2016-01-08 (×3): 325 mg via ORAL
  Filled 2016-01-05 (×3): qty 1

## 2016-01-05 MED ORDER — DOXAZOSIN MESYLATE 4 MG PO TABS
4.0000 mg | ORAL_TABLET | Freq: Every day | ORAL | Status: DC
  Administered 2016-01-05 – 2016-01-06 (×2): 4 mg via ORAL
  Filled 2016-01-05 (×4): qty 1

## 2016-01-05 MED ORDER — CLONIDINE HCL 0.2 MG PO TABS
0.3000 mg | ORAL_TABLET | Freq: Two times a day (BID) | ORAL | Status: DC
  Administered 2016-01-06 – 2016-01-08 (×4): 0.3 mg via ORAL
  Filled 2016-01-05 (×6): qty 1

## 2016-01-05 MED ORDER — FENTANYL CITRATE (PF) 250 MCG/5ML IJ SOLN
INTRAMUSCULAR | Status: AC
  Filled 2016-01-05: qty 5

## 2016-01-05 MED ORDER — AMLODIPINE BESYLATE 10 MG PO TABS
10.0000 mg | ORAL_TABLET | Freq: Every day | ORAL | Status: DC
  Administered 2016-01-06 – 2016-01-07 (×2): 10 mg via ORAL
  Filled 2016-01-05 (×2): qty 1

## 2016-01-05 MED ORDER — ADULT MULTIVITAMIN W/MINERALS CH
1.0000 | ORAL_TABLET | Freq: Every day | ORAL | Status: DC
  Administered 2016-01-05 – 2016-01-08 (×4): 1 via ORAL
  Filled 2016-01-05 (×4): qty 1

## 2016-01-05 MED ORDER — MENTHOL 3 MG MT LOZG
1.0000 | LOZENGE | OROMUCOSAL | Status: DC | PRN
Start: 2016-01-05 — End: 2016-01-08

## 2016-01-05 MED ORDER — FESOTERODINE FUMARATE ER 4 MG PO TB24
4.0000 mg | ORAL_TABLET | Freq: Every day | ORAL | Status: DC
  Administered 2016-01-05 – 2016-01-06 (×2): 4 mg via ORAL
  Filled 2016-01-05 (×4): qty 1

## 2016-01-05 MED ORDER — ACETAMINOPHEN 650 MG RE SUPP: 650.0000 mg | Freq: Four times a day (QID) | RECTAL | Status: DC | PRN

## 2016-01-05 MED ORDER — AMLODIPINE BESYLATE-VALSARTAN 10-160 MG PO TABS: 1.0000 | ORAL_TABLET | Freq: Every day | ORAL | Status: DC

## 2016-01-05 MED ORDER — MIDAZOLAM HCL 2 MG/2ML IJ SOLN
INTRAMUSCULAR | Status: AC
  Filled 2016-01-05: qty 2

## 2016-01-05 MED ORDER — METOCLOPRAMIDE HCL 5 MG PO TABS: 5.0000 mg | ORAL_TABLET | Freq: Three times a day (TID) | ORAL | Status: DC | PRN

## 2016-01-05 MED ORDER — DIPHENHYDRAMINE HCL 12.5 MG/5ML PO ELIX: 12.5000 mg | ORAL_SOLUTION | ORAL | Status: DC | PRN

## 2016-01-05 MED ORDER — METOCLOPRAMIDE HCL 5 MG/ML IJ SOLN: 5.0000 mg | Freq: Three times a day (TID) | INTRAMUSCULAR | Status: DC | PRN

## 2016-01-05 MED ORDER — GABAPENTIN 300 MG PO CAPS
300.0000 mg | ORAL_CAPSULE | Freq: Three times a day (TID) | ORAL | Status: DC
  Administered 2016-01-05 – 2016-01-08 (×9): 300 mg via ORAL
  Filled 2016-01-05 (×9): qty 1

## 2016-01-05 MED ORDER — PHENYLEPHRINE HCL 10 MG/ML IJ SOLN
INTRAMUSCULAR | Status: DC | PRN
  Administered 2016-01-05 (×3): 40 ug via INTRAVENOUS

## 2016-01-05 MED ORDER — ONDANSETRON HCL 4 MG/2ML IJ SOLN: 4.0000 mg | Freq: Four times a day (QID) | INTRAMUSCULAR | Status: DC | PRN

## 2016-01-05 MED ORDER — EPHEDRINE SULFATE 50 MG/ML IJ SOLN
INTRAMUSCULAR | Status: DC | PRN
  Administered 2016-01-05: 10 mg via INTRAVENOUS
  Administered 2016-01-05 (×2): 5 mg via INTRAVENOUS

## 2016-01-05 MED ORDER — MIDAZOLAM HCL 2 MG/2ML IJ SOLN
INTRAMUSCULAR | Status: DC | PRN
  Administered 2016-01-05: 1 mg via INTRAVENOUS

## 2016-01-05 MED ORDER — BUPIVACAINE IN DEXTROSE 0.75-8.25 % IT SOLN
INTRATHECAL | Status: DC | PRN
  Administered 2016-01-05: 2 mL via INTRATHECAL

## 2016-01-05 MED ORDER — KCL IN DEXTROSE-NACL 20-5-0.45 MEQ/L-%-% IV SOLN
INTRAVENOUS | Status: DC
  Administered 2016-01-05 – 2016-01-06 (×2): via INTRAVENOUS
  Filled 2016-01-05 (×2): qty 1000

## 2016-01-05 MED ORDER — PANTOPRAZOLE SODIUM 40 MG PO TBEC
40.0000 mg | DELAYED_RELEASE_TABLET | Freq: Every day | ORAL | Status: DC
  Administered 2016-01-05 – 2016-01-08 (×4): 40 mg via ORAL
  Filled 2016-01-05 (×4): qty 1

## 2016-01-05 MED ORDER — ALUM & MAG HYDROXIDE-SIMETH 200-200-20 MG/5ML PO SUSP: 30.0000 mL | ORAL | Status: DC | PRN

## 2016-01-05 MED ORDER — BUPIVACAINE HCL 0.5 % IJ SOLN
INTRAMUSCULAR | Status: DC | PRN
  Administered 2016-01-05: 20 mL

## 2016-01-05 MED ORDER — FENTANYL CITRATE (PF) 100 MCG/2ML IJ SOLN
INTRAMUSCULAR | Status: AC
  Administered 2016-01-05: 100 ug
  Filled 2016-01-05: qty 2

## 2016-01-05 MED ORDER — LACTATED RINGERS IV SOLN
INTRAVENOUS | Status: DC
  Administered 2016-01-05 (×2): via INTRAVENOUS

## 2016-01-05 MED ORDER — HYDROMORPHONE HCL 1 MG/ML IJ SOLN
0.5000 mg | INTRAMUSCULAR | Status: DC | PRN
  Administered 2016-01-05 – 2016-01-06 (×3): 1 mg via INTRAVENOUS
  Filled 2016-01-05 (×3): qty 1

## 2016-01-05 MED ORDER — ONDANSETRON HCL 4 MG/2ML IJ SOLN: 4.0000 mg | Freq: Once | INTRAMUSCULAR | Status: DC | PRN

## 2016-01-05 MED ORDER — BUPIVACAINE-EPINEPHRINE (PF) 0.5% -1:200000 IJ SOLN
INTRAMUSCULAR | Status: DC | PRN
  Administered 2016-01-05: 30 mL via PERINEURAL

## 2016-01-05 MED ORDER — PROPOFOL 500 MG/50ML IV EMUL
INTRAVENOUS | Status: DC | PRN
  Administered 2016-01-05: 5 ug/kg/min via INTRAVENOUS

## 2016-01-05 MED ORDER — CITALOPRAM HYDROBROMIDE 10 MG PO TABS
10.0000 mg | ORAL_TABLET | Freq: Every day | ORAL | Status: DC
  Administered 2016-01-06 – 2016-01-08 (×3): 10 mg via ORAL
  Filled 2016-01-05 (×3): qty 1

## 2016-01-05 MED ORDER — DOCUSATE SODIUM 100 MG PO CAPS
100.0000 mg | ORAL_CAPSULE | Freq: Two times a day (BID) | ORAL | Status: DC
  Administered 2016-01-05 – 2016-01-08 (×7): 100 mg via ORAL
  Filled 2016-01-05 (×7): qty 1

## 2016-01-05 MED ORDER — PROPOFOL 10 MG/ML IV BOLUS
INTRAVENOUS | Status: DC | PRN
  Administered 2016-01-05: 20 mg via INTRAVENOUS

## 2016-01-05 MED ORDER — VITAMIN B-12 100 MCG PO TABS
100.0000 ug | ORAL_TABLET | Freq: Every day | ORAL | Status: DC
  Administered 2016-01-06 – 2016-01-08 (×3): 100 ug via ORAL
  Filled 2016-01-05 (×3): qty 1

## 2016-01-05 MED ORDER — ATORVASTATIN CALCIUM 10 MG PO TABS
10.0000 mg | ORAL_TABLET | Freq: Every day | ORAL | Status: DC
  Administered 2016-01-05 – 2016-01-07 (×3): 10 mg via ORAL
  Filled 2016-01-05 (×3): qty 1

## 2016-01-05 MED ORDER — TRAMADOL HCL 50 MG PO TABS
50.0000 mg | ORAL_TABLET | Freq: Three times a day (TID) | ORAL | Status: DC | PRN
Start: 2016-01-05 — End: 2016-01-08
  Administered 2016-01-06: 100 mg via ORAL
  Filled 2016-01-05: qty 2

## 2016-01-05 MED ORDER — MIDAZOLAM HCL 2 MG/2ML IJ SOLN
INTRAMUSCULAR | Status: AC
  Administered 2016-01-05: 2 mg
  Filled 2016-01-05: qty 2

## 2016-01-05 MED ORDER — BUPIVACAINE HCL (PF) 0.5 % IJ SOLN
INTRAMUSCULAR | Status: AC
  Filled 2016-01-05: qty 30

## 2016-01-05 MED ORDER — PHENOL 1.4 % MT LIQD
1.0000 | OROMUCOSAL | Status: DC | PRN
Start: 2016-01-05 — End: 2016-01-08

## 2016-01-05 MED ORDER — FUROSEMIDE 20 MG PO TABS
20.0000 mg | ORAL_TABLET | Freq: Two times a day (BID) | ORAL | Status: DC
  Administered 2016-01-06 – 2016-01-08 (×5): 20 mg via ORAL
  Filled 2016-01-05 (×6): qty 1

## 2016-01-05 MED ORDER — OXYCODONE HCL 5 MG PO TABS
5.0000 mg | ORAL_TABLET | ORAL | Status: DC | PRN
  Administered 2016-01-05: 10 mg via ORAL
  Administered 2016-01-05: 5 mg via ORAL
  Administered 2016-01-06 – 2016-01-08 (×10): 10 mg via ORAL
  Filled 2016-01-05 (×10): qty 2
  Filled 2016-01-05: qty 1
  Filled 2016-01-05 (×2): qty 2

## 2016-01-05 MED ORDER — FLEET ENEMA 7-19 GM/118ML RE ENEM: 1.0000 | ENEMA | Freq: Once | RECTAL | Status: DC | PRN

## 2016-01-05 MED ORDER — FENTANYL CITRATE (PF) 250 MCG/5ML IJ SOLN
INTRAMUSCULAR | Status: DC | PRN
  Administered 2016-01-05: 50 ug via INTRAVENOUS

## 2016-01-05 MED ORDER — SENNOSIDES-DOCUSATE SODIUM 8.6-50 MG PO TABS: 1.0000 | ORAL_TABLET | Freq: Every evening | ORAL | Status: DC | PRN

## 2016-01-05 MED ORDER — SODIUM CHLORIDE 0.9 % IR SOLN
Status: DC | PRN
  Administered 2016-01-05: 1000 mL

## 2016-01-05 SURGICAL SUPPLY — 69 items
BAG DECANTER FOR FLEXI CONT (MISCELLANEOUS) ×1 IMPLANT
BANDAGE ACE 4X5 VEL STRL LF (GAUZE/BANDAGES/DRESSINGS) ×1 IMPLANT
BANDAGE ACE 6X5 VEL STRL LF (GAUZE/BANDAGES/DRESSINGS) ×1 IMPLANT
BANDAGE ESMARK 6X9 LF (GAUZE/BANDAGES/DRESSINGS) ×1 IMPLANT
BLADE SAG 18X100X1.27 (BLADE) ×2 IMPLANT
BLADE SAW SGTL 13.0X1.19X90.0M (BLADE) ×1 IMPLANT
BLADE SAW SGTL 13X75X1.27 (BLADE) ×2 IMPLANT
BLADE SURG ROTATE 9660 (MISCELLANEOUS) IMPLANT
BNDG CMPR 9X6 STRL LF SNTH (GAUZE/BANDAGES/DRESSINGS) ×1
BNDG CMPR MED 10X6 ELC LF (GAUZE/BANDAGES/DRESSINGS) ×1
BNDG ELASTIC 6X10 VLCR STRL LF (GAUZE/BANDAGES/DRESSINGS) ×2 IMPLANT
BNDG ESMARK 6X9 LF (GAUZE/BANDAGES/DRESSINGS) ×2
BOWL SMART MIX CTS (DISPOSABLE) ×2 IMPLANT
CAPT KNEE TOTAL 3 ATTUNE ×1 IMPLANT
CEMENT HV SMART SET (Cement) ×4 IMPLANT
COVER SURGICAL LIGHT HANDLE (MISCELLANEOUS) ×2 IMPLANT
CUFF TOURNIQUET SINGLE 34IN LL (TOURNIQUET CUFF) ×2 IMPLANT
CUFF TOURNIQUET SINGLE 44IN (TOURNIQUET CUFF) IMPLANT
DECANTER SPIKE VIAL GLASS SM (MISCELLANEOUS) ×1 IMPLANT
DRAPE EXTREMITY T 121X128X90 (DRAPE) ×2 IMPLANT
DRAPE U-SHAPE 47X51 STRL (DRAPES) ×2 IMPLANT
DRSG AQUACEL AG ADV 3.5X14 (GAUZE/BANDAGES/DRESSINGS) ×1 IMPLANT
DURAPREP 26ML APPLICATOR (WOUND CARE) ×4 IMPLANT
ELECT REM PT RETURN 9FT ADLT (ELECTROSURGICAL) ×2
ELECTRODE REM PT RTRN 9FT ADLT (ELECTROSURGICAL) ×1 IMPLANT
EVACUATOR 1/8 PVC DRAIN (DRAIN) IMPLANT
GAUZE SPONGE 4X4 12PLY STRL (GAUZE/BANDAGES/DRESSINGS) ×4 IMPLANT
GAUZE XEROFORM 1X8 LF (GAUZE/BANDAGES/DRESSINGS) ×2 IMPLANT
GLOVE BIO SURGEON STRL SZ7.5 (GLOVE) ×6 IMPLANT
GLOVE BIO SURGEON STRL SZ8.5 (GLOVE) ×2 IMPLANT
GLOVE BIOGEL PI IND STRL 7.0 (GLOVE) IMPLANT
GLOVE BIOGEL PI IND STRL 8 (GLOVE) ×1 IMPLANT
GLOVE BIOGEL PI IND STRL 9 (GLOVE) ×1 IMPLANT
GLOVE BIOGEL PI INDICATOR 7.0 (GLOVE) ×3
GLOVE BIOGEL PI INDICATOR 8 (GLOVE) ×1
GLOVE BIOGEL PI INDICATOR 9 (GLOVE) ×1
GLOVE ECLIPSE 6.5 STRL STRAW (GLOVE) ×1 IMPLANT
GLOVE ECLIPSE 7.0 STRL STRAW (GLOVE) ×1 IMPLANT
GOWN STRL REIN 3XL LVL4 (GOWN DISPOSABLE) ×1 IMPLANT
GOWN STRL REUS W/ TWL LRG LVL3 (GOWN DISPOSABLE) ×1 IMPLANT
GOWN STRL REUS W/ TWL XL LVL3 (GOWN DISPOSABLE) ×2 IMPLANT
GOWN STRL REUS W/TWL LRG LVL3 (GOWN DISPOSABLE) ×2
GOWN STRL REUS W/TWL XL LVL3 (GOWN DISPOSABLE) ×4
HANDPIECE INTERPULSE COAX TIP (DISPOSABLE) ×2
HOOD PEEL AWAY FACE SHEILD DIS (HOOD) ×4 IMPLANT
KIT BASIN OR (CUSTOM PROCEDURE TRAY) ×2 IMPLANT
KIT ROOM TURNOVER OR (KITS) ×2 IMPLANT
MANIFOLD NEPTUNE II (INSTRUMENTS) ×2 IMPLANT
NDL SPNL 18GX3.5 QUINCKE PK (NEEDLE) IMPLANT
NEEDLE SPNL 18GX3.5 QUINCKE PK (NEEDLE) IMPLANT
NS IRRIG 1000ML POUR BTL (IV SOLUTION) ×2 IMPLANT
PACK TOTAL JOINT (CUSTOM PROCEDURE TRAY) ×2 IMPLANT
PACK UNIVERSAL I (CUSTOM PROCEDURE TRAY) ×2 IMPLANT
PAD ARMBOARD 7.5X6 YLW CONV (MISCELLANEOUS) ×4 IMPLANT
PADDING CAST COTTON 6X4 STRL (CAST SUPPLIES) ×2 IMPLANT
SET HNDPC FAN SPRY TIP SCT (DISPOSABLE) ×1 IMPLANT
SUT VIC AB 0 CT1 27 (SUTURE) ×2
SUT VIC AB 0 CT1 27XBRD ANBCTR (SUTURE) ×1 IMPLANT
SUT VIC AB 1 CTX 36 (SUTURE) ×2
SUT VIC AB 1 CTX36XBRD ANBCTR (SUTURE) ×1 IMPLANT
SUT VIC AB 2-0 CT1 27 (SUTURE)
SUT VIC AB 2-0 CT1 TAPERPNT 27 (SUTURE) IMPLANT
SUT VIC AB 3-0 CT1 27 (SUTURE) ×2
SUT VIC AB 3-0 CT1 TAPERPNT 27 (SUTURE) ×1 IMPLANT
SYR 50ML LL SCALE MARK (SYRINGE) ×2 IMPLANT
TOWEL OR 17X24 6PK STRL BLUE (TOWEL DISPOSABLE) ×2 IMPLANT
TOWEL OR 17X26 10 PK STRL BLUE (TOWEL DISPOSABLE) ×2 IMPLANT
TRAY CATH 16FR W/PLASTIC CATH (SET/KITS/TRAYS/PACK) ×1 IMPLANT
WATER STERILE IRR 1000ML POUR (IV SOLUTION) ×6 IMPLANT

## 2016-01-05 NOTE — Transfer of Care (Signed)
Immediate Anesthesia Transfer of Care Note  Patient: Chelsea [name]  Procedure(s) Performed: Procedure(s): TOTAL KNEE ARTHROPLASTY (Left)  Patient Location: PACU  Anesthesia Type:MAC, Spinal and MAC combined with regional for post-op pain  Level of Consciousness: awake, alert  and oriented  Airway & Oxygen Therapy: Patient Spontanous Breathing and Patient connected to face mask oxygen  Post-op Assessment: Report given to RN and Post -op Vital signs reviewed and stable  Post vital signs: Reviewed and stable  Last Vitals:  Filed Vitals:   01/05/16 0933 01/05/16 0938  BP: 107/61 104/60  Pulse: 68 71  Temp:    Resp: 7 12    Complications: No apparent anesthesia complications

## 2016-01-05 NOTE — Anesthesia Preprocedure Evaluation (Signed)
Anesthesia Evaluation  Patient identified by MRN, date of birth, ID band Patient awake    Reviewed: Allergy & Precautions, NPO status , Patient's Chart, lab work & pertinent test results  Airway Mallampati: I  TM Distance: >3 FB Neck ROM: Full    Dental   Pulmonary    Pulmonary exam normal        Cardiovascular hypertension, Pt. on medications Normal cardiovascular exam     Neuro/Psych Anxiety Depression    GI/Hepatic GERD  Medicated and Controlled,  Endo/Other    Renal/GU      Musculoskeletal   Abdominal   Peds  Hematology   Anesthesia Other Findings   Reproductive/Obstetrics                             Anesthesia Physical Anesthesia Plan  ASA: II  Anesthesia Plan: Spinal   Post-op Pain Management: MAC Combined w/ Regional for Post-op pain   Induction: Intravenous  Airway Management Planned: Simple Face Mask  Additional Equipment:   Intra-op Plan:   Post-operative Plan:   Informed Consent: I have reviewed the patients History and Physical, chart, labs and discussed the procedure including the risks, benefits and alternatives for the proposed anesthesia with the patient or authorized representative who has indicated his/her understanding and acceptance.     Plan Discussed with: CRNA and Surgeon  Anesthesia Plan Comments:         Anesthesia Quick Evaluation

## 2016-01-05 NOTE — Evaluation (Signed)
Physical Therapy Evaluation Patient Details Name: Chelsea [name] MRN: [id number] DOB: 25-Jan-1951 Today's Date: 01/05/2016   History of Present Illness  65 y.o. female admitted to [facility] on 01/05/16 for elective L TKA.  Pt with significant PMHx of stroke with residual left sided weakness, HTN, mild to moderate aortic insufficiency, depression/anxiety, and obesity.    Clinical Impression  Pt is POD #0 and is able to get OOB to the chair with min to mod assist and RW.  She reports she has not arranged for 24/7 assist when she goes home (son works days), but could call a neighbor for help if needed.  I would prefer, for her safety given her h/o stroke and increased fall risk compared to other TKA patients, for her to have 24/7 assist for at least a week when she first discharges home.   PT to follow acutely for deficits listed below.       Follow Up Recommendations Home health PT;Supervision/Assistance - 24 hour    Equipment Recommendations  Rolling walker with 5" wheels;3in1 (PT)    Recommendations for Other Services   NA    Precautions / Restrictions Precautions Precautions: Fall;Knee Precaution Booklet Issued: Yes (comment) Precaution Comments: knee hanout given Restrictions Weight Bearing Restrictions: Yes LLE Weight Bearing: Weight bearing as tolerated      Mobility  Bed Mobility Overal bed mobility: Needs Assistance Bed Mobility: Supine to Sit     Supine to sit: Min assist     General bed mobility comments: Min assist to help progress left leg over EOB.  Pt relying heavily on bed rails and HOB elevated to get seated EOB.  verbal cues for sequencing and technique.   Transfers Overall transfer level: Needs assistance Equipment used: Rolling walker (2 wheeled) Transfers: Sit to/from [facility] Sit to Stand: Mod assist Stand pivot transfers: Min assist       General transfer comment: Mod assist to support trunk to get to standing initally, but once standing pt  was min assist to help steady herself in the RW to take steps to the recliner chair.                          Balance Overall balance assessment: Needs assistance Sitting-balance support: Feet supported;Single extremity supported Sitting balance-Leahy Scale: Good     Standing balance support: Bilateral upper extremity supported Standing balance-Leahy Scale: Poor                               Pertinent Vitals/Pain Pain Assessment: 0-10 Pain Score: 8  Pain Location: left knee Pain Descriptors / Indicators: Aching;Burning Pain Intervention(s): Limited activity within patient's tolerance;Monitored during session;Repositioned;Patient requesting pain meds-RN notified    Home Living Family/patient expects to be discharged to:: Private residence Living Arrangements: Alone Available Help at Discharge: Family;Available PRN/intermittently (son in AMs and PMs after work, Industrial/product designer by phone) Type of Home: House Home Access: Stairs to enter Entrance Stairs-Rails: None Technical brewer of Steps: 1 Home Layout: One level Home Equipment: Cane - single point      Prior Function Level of Independence: Independent with assistive device(s)         Comments: pt walked with a cane     Hand Dominance   Dominant Hand: Right    Extremity/Trunk Assessment   Upper Extremity Assessment: LUE deficits/detail (due to PMHx of stroke, strong enough to grip RW)  Lower Extremity Assessment: LLE deficits/detail   LLE Deficits / Details: left leg with normal post op pain and weakness.  Some weakness on this side due to PMHx of stroke at baseline, however, ankle at least 3/5, knee 2/5, hip 2/5 per bed level gross functional assessment.   Cervical / Trunk Assessment: Normal  Communication   Communication: No difficulties  Cognition Arousal/Alertness: Awake/alert Behavior During Therapy: WFL for tasks assessed/performed Overall Cognitive Status: Within  Functional Limits for tasks assessed                         Exercises Total Joint Exercises Ankle Circles/Pumps: AROM;Both;10 reps      Assessment/Plan    PT Assessment Patient needs continued PT services  PT Diagnosis Abnormality of gait;Difficulty walking;Generalized weakness;Acute pain   PT Problem List Decreased strength;Decreased activity tolerance;Decreased range of motion;Decreased balance;Decreased mobility;Decreased knowledge of use of DME;Decreased knowledge of precautions;Pain  PT Treatment Interventions DME instruction;Gait training;Stair training;Functional mobility training;Therapeutic activities;Therapeutic exercise;Neuromuscular re-education;Balance training;Patient/family education;Modalities;Manual techniques   PT Goals (Current goals can be found in the Care Plan section) Acute Rehab PT Goals Patient Stated Goal: to get back to walking PT Goal Formulation: With patient Time For Goal Achievement: 01/12/16 Potential to Achieve Goals: Good    Frequency 7X/week   Barriers to discharge Decreased caregiver support it does not sound like pt has arranged for 24/7 assist at discharge.  She has a complicated PMHx and I think she would be safer with 24/7 assit for the first week.        End of Session Equipment Utilized During Treatment: Gait belt Activity Tolerance: Patient limited by pain Patient left: in chair;with call bell/phone within reach;with chair alarm set;with family/visitor present Nurse Communication: Mobility status         Time: [id number] PT Time Calculation (min) (ACUTE ONLY): 22 min   Charges:   PT Evaluation $PT Eval Moderate Complexity: 1 Procedure          Airel Magadan B. Roberts, [place], DPT [phone]   01/05/2016, 6:15 PM

## 2016-01-05 NOTE — Op Note (Addendum)
PATIENT ID:      Chelsea [name]  MRN:     [id number] DOB/AGE:    Jul 10, 1951 / 65 y.o.       OPERATIVE REPORT    DATE OF PROCEDURE:  01/05/2016       PREOPERATIVE DIAGNOSIS:   LEFT KNEE OSTEOARTHRITIS      Estimated body mass index is 34.96 kg/(m^2) as calculated from the following:   Height as of 12/25/15: 5\' 6"  (1.676 m).   Weight as of this encounter: 98.204 kg (216 lb 8 oz).                                                        POSTOPERATIVE DIAGNOSIS:   LEFT KNEE OSTEOARTHRITIS                                                                      PROCEDURE:  Procedure(s): TOTAL KNEE ARTHROPLASTY Using DepuyAttune RP implants #6L Femur, #6Tibia, 5 mm Attune RP bearing, 38 Patella     SURGEON: Shelsey Rieth J    ASSISTANT:  [employer] RNFA   (Present and scrubbed throughout the case, critical for assistance with exposure, retraction, instrumentation, and closure.)         ANESTHESIA: Spinal, 20cc Exparel, 20cc 0.5% Marcaine  EBL: 400  FLUID REPLACEMENT: 1600 crystalloid  TOURNIQUET TIME: 67min  Drains: None  Tranexamic Acid: 2gm topical   COMPLICATIONS:  None         INDICATIONS FOR PROCEDURE: The patient has  LEFT KNEE OSTEOARTHRITIS, Valgu deformities, XR shows bone on bone arthritis, lateral subluxation of tibia. Patient has failed all conservative measures including anti-inflammatory medicines, narcotics, attempts at  exercise and weight loss, cortisone injections and viscosupplementation.  Risks and benefits of surgery have been discussed, questions answered.   DESCRIPTION OF PROCEDURE: The patient identified by armband, received  IV antibiotics, in the holding area at [facility]. Patient taken to the operating room, appropriate anesthetic  monitors were attached, and spinal anesthesia was  induced. Tourniquet  applied high to the operative thigh. Lateral post and foot positioner  applied to the table, the lower extremity was then prepped and draped  in usual sterile  fashion from the toes to the tourniquet. Time-out procedure was performed. We began the operation, with the knee flexed 120 degrees, by making the anterior midline incision starting at handbreadth above the patella going over the patella 1 cm medial to and 4 cm distal to the tibial tubercle. Small bleeders in the skin and the  subcutaneous tissue identified and cauterized. Transverse retinaculum was incised and reflected medially and a medial parapatellar arthrotomy was accomplished. the patella was everted and theprepatellar fat pad resected. The superficial medial collateral  ligament was then elevated from anterior to posterior along the proximal  flare of the tibia and anterior half of the menisci resected. The knee was hyperflexed exposing bone on bone arthritis. Peripheral and notch osteophytes as well as the cruciate ligaments were then resected. We continued to  work our way around posteriorly along the proximal tibia, and externally  rotated the tibia subluxing it out from underneath the femur. A McHale  retractor was placed through the notch and a lateral Hohmann retractor  placed, and we then drilled through the proximal tibia in line with the  axis of the tibia followed by an intramedullary guide rod and 2-degree  posterior slope cutting guide. The tibial cutting guide, 3 degree posterior sloped, was pinned into place allowing resection of 4 mm of bone medially and 6 mm of bone laterally. Satisfied with the tibial resection, we then  entered the distal femur 2 mm anterior to the PCL origin with the  intramedullary guide rod and applied the distal femoral cutting guide  set at 9 mm, with 5 degrees of valgus. This was pinned along the  epicondylar axis. At this point, the distal femoral cut was accomplished without difficulty. We then sized for a #6L femoral component and pinned the guide in 3 degrees of external rotation. The chamfer cutting guide was pinned into place. The anterior,  posterior, and chamfer cuts were accomplished without difficulty followed by  the Attune RP box cutting guide and the box cut. We also removed posterior osteophytes from the posterior femoral condyles. At this  time, the knee was brought into full extension. We checked our  extension and flexion gaps and found them symmetric for a 7 mm bearing. Distracting in extension with a lamina spreader, the posterior horns of the menisci were removed, and Exparel, diluted to 60 cc, with 20cc NS, and 20cc 0.5% Marcaine,was injected into the capsule and synovium of the knee. The posterior patella cut was accomplished with the 9.5 mm Attune cutting guide, sized for a 49mm dome, and the fixation pegs drilled.The knee  was then once again hyperflexed exposing the proximal tibia. We sized for a # 6 tibial base plate, applied the smokestack and the conical reamer followed by the the Delta fin keel punch. We then hammered into place the Attune RP trial femoral component, drilled the lugs, inserted a  7 mm trial bearing, trial patellar button, and took the knee through range of motion from 0-130 degrees. No thumb pressure was required for patellar Tracking. At this point, the limb was wrapped with an Esmarch bandage and the tourniquet inflated to 350 mmHg. All trial components were removed, mating surfaces irrigated with pulse lavage, and dried with suction and sponges. A double batch of DePuy HV cement with 1500 mg of Zinacef was mixed and applied to all bony metallic mating surfaces except for the posterior condyles of the femur itself. In order, we  hammered into place the tibial tray and removed excess cement, the femoral component and removed excess cement. The final Attune RP bearing  was inserted, and the knee brought to full extension with compression.  The patellar button was clamped into place, and excess cement  removed. While the cement cured the wound was irrigated out with normal saline solution pulse lavage.  Ligament stability and patellar tracking were checked and found to be excellent. The parapatellar arthrotomy was closed with  running #1 Vicryl suture. The subcutaneous tissue with 0 and 2-0 undyed  Vicryl suture, and the skin with running 3-0 SQ vicryl. A dressing of Xeroform,  4 x 4, dressing sponges, Webril, and Ace wrap applied. The patient  awakened, and taken to recovery room without difficulty.   Jireh Vinas J 01/05/2016, 11:38 AM

## 2016-01-05 NOTE — Interval H&P Note (Signed)
History and Physical Interval Note:  01/05/2016 9:15 AM  Chelsea [name]  has presented today for surgery, with the diagnosis of LEFT KNEE OSTEOARTHRITIS  The various methods of treatment have been discussed with the patient and family. After consideration of risks, benefits and other options for treatment, the patient has consented to  Procedure(s): TOTAL KNEE ARTHROPLASTY (Left) as a surgical intervention .  The patient's history has been reviewed, patient examined, no change in status, stable for surgery.  I have reviewed the patient's chart and labs.  Questions were answered to the patient's satisfaction.     Kerin Salen

## 2016-01-05 NOTE — Anesthesia Procedure Notes (Addendum)
Anesthesia Regional Block:  Adductor canal block  Pre-Anesthetic Checklist: ,, timeout performed, Correct Patient, Correct Site, Correct Laterality, Correct Procedure, Correct Position, site marked, Risks and benefits discussed,  Surgical consent,  Pre-op evaluation,  At surgeon's request and post-op pain management  Laterality: Left  Prep: chloraprep       Needles:  Injection technique: Single-shot  Needle Type: Echogenic Stimulator Needle     Needle Length: 9cm 9 cm Needle Gauge: 21 and 21 G    Additional Needles:  Procedures: ultrasound guided (picture in chart) Adductor canal block Narrative:  Start time: 01/05/2016 9:15 AM End time: 01/05/2016 9:30 AM Injection made incrementally with aspirations every 5 mL.  Performed by: Personally  Anesthesiologist: Lillia Abed  Additional Notes: Monitors applied. Patient sedated. Sterile prep and drape,hand hygiene and sterile gloves were used. Relevant anatomy identified.Needle position confirmed.Local anesthetic injected incrementally after negative aspiration. Local anesthetic spread visualized around nerve(s). Vascular puncture avoided. No complications. Image printed for medical record.The patient tolerated the procedure well.    Lillia Abed MD   Spinal Patient location during procedure: OR Start time: 01/05/2016 10:07 AM End time: 01/05/2016 10:12 AM Staffing Anesthesiologist: Lillia Abed Performed by: anesthesiologist  Preanesthetic Checklist Completed: patient identified, site marked, surgical consent, pre-op evaluation, timeout performed, IV checked, risks and benefits discussed and monitors and equipment checked Spinal Block Patient position: sitting Prep: Betadine Patient monitoring: heart rate, cardiac monitor, continuous pulse ox and blood pressure Approach: midline Location: L3-4 Injection technique: single-shot Needle Needle type: Pencan  Needle gauge: 24 G Needle length: 9 cm Needle insertion depth: 6  cm

## 2016-01-05 NOTE — Anesthesia Postprocedure Evaluation (Addendum)
Anesthesia Post Note  Patient: Chelsea [name]  Procedure(s) Performed: Procedure(s) (LRB): TOTAL KNEE ARTHROPLASTY (Left)  Patient location during evaluation: PACU Anesthesia Type: Spinal Level of consciousness: awake and alert Pain management: pain level controlled Vital Signs Assessment: post-procedure vital signs reviewed and stable Respiratory status: spontaneous breathing, nonlabored ventilation, respiratory function stable and patient connected to nasal cannula oxygen Cardiovascular status: blood pressure returned to baseline and stable Postop Assessment: no signs of nausea or vomiting Anesthetic complications: no    Last Vitals:  Filed Vitals:   01/05/16 1205 01/05/16 1215  BP: 101/59   Pulse: 67 55  Temp: 36.4 C   Resp: 14 11    Last Pain:  Filed Vitals:   01/05/16 1215  PainSc: 0-No pain                 Bonnell Placzek DAVID

## 2016-01-05 NOTE — Progress Notes (Addendum)
Pt requested to use the [facility] and get back to bed. RN and nursing student helped pt get to [facility]. As patient was on the [facility] pt c/o being dizzy. Vital signs taken at time of incident BP was 72/37. Pt was Alert and Oriented the whole time. Pt was then helped back into the bed. BP was then 90/48. Pt stated dizziness had subsided. Paged the answering service and awaiting return page.

## 2016-01-05 NOTE — Progress Notes (Signed)
Utilization review completed.  

## 2016-01-05 NOTE — Progress Notes (Signed)
Orders for 500cc bolus given by PA on call.

## 2016-01-06 ENCOUNTER — Encounter (HOSPITAL_COMMUNITY): Payer: Self-pay | Admitting: Orthopedic Surgery

## 2016-01-06 LAB — BASIC METABOLIC PANEL
Anion gap: 8 (ref 5–15)
BUN: 10 mg/dL (ref 6–20)
CO2: 26 mmol/L (ref 22–32)
Calcium: 8.4 mg/dL — ABNORMAL LOW (ref 8.9–10.3)
Chloride: 104 mmol/L (ref 101–111)
Creatinine, Ser: 1.31 mg/dL — ABNORMAL HIGH (ref 0.44–1.00)
GFR calc Af Amer: 49 mL/min — ABNORMAL LOW (ref >60)
GFR calc non Af Amer: 42 mL/min — ABNORMAL LOW (ref >60)
Glucose, Bld: 140 mg/dL — ABNORMAL HIGH (ref 65–99)
Potassium: 3.3 mmol/L — ABNORMAL LOW (ref 3.5–5.1)
Sodium: 138 mmol/L (ref 135–145)

## 2016-01-06 LAB — CBC
HCT: 34.1 % — ABNORMAL LOW (ref 36.0–46.0)
Hemoglobin: 10.5 g/dL — ABNORMAL LOW (ref 12.0–15.0)
MCH: 27.7 pg (ref 26.0–34.0)
MCHC: 30.8 g/dL (ref 30.0–36.0)
MCV: 90 fL (ref 78.0–100.0)
Platelets: 241 10*3/uL (ref 150–400)
RBC: 3.79 MIL/uL — ABNORMAL LOW (ref 3.87–5.11)
RDW: 15.3 % (ref 11.5–15.5)
WBC: 8.2 10*3/uL (ref 4.0–10.5)

## 2016-01-06 MED ORDER — ASPIRIN EC 325 MG PO TBEC: 325.0000 mg | DELAYED_RELEASE_TABLET | Freq: Two times a day (BID) | ORAL | Status: DC

## 2016-01-06 MED ORDER — METHOCARBAMOL 500 MG PO TABS: ORAL_TABLET | ORAL | Status: DC

## 2016-01-06 MED ORDER — OXYCODONE-ACETAMINOPHEN 5-325 MG PO TABS: 1.0000 | ORAL_TABLET | ORAL | Status: DC | PRN

## 2016-01-06 NOTE — Progress Notes (Signed)
OT calls RN to room to inform her of pt incision dsg bleeding after therapy. Mepilex dsg saturated with blood and leakage to the side. Dr. Mayer Camel notified and he ordered to remove old dsg, clean incision with hydrogen peroxide and applied 4x4 gauze dsgs with compression wrap. dsg changed, clean dry and intact. Will closely monitor. Delia Heady RN

## 2016-01-06 NOTE — Progress Notes (Signed)
Patient ID: Chelsea [name], female   DOB: 09-Jun-1951, 65 y.o.   MRN: [id number] PATIENT ID: Chelsea [name]  MRN: [id number]  DOB/AGE:  1951-09-28 / 65 y.o.  1 Day Post-Op Procedure(s) (LRB): TOTAL KNEE ARTHROPLASTY (Left)    PROGRESS NOTE Subjective: Patient is alert, oriented, no Nausea, no Vomiting, yes passing gas. Taking PO well. Denies SOB, Chest or Calf Pain. Using Incentive Spirometer, PAS in place. Ambulate in room yesterday, CPM 0-40 Patient reports pain as 6/10 .    Objective: Vital signs in last 24 hours: Filed Vitals:   01/05/16 1940 01/05/16 2245 01/06/16 0236 01/06/16 0559  BP: 142/70 111/61 103/67 110/51  Pulse: 76 83 92 106  Temp:  98.2 F (36.8 C) 98.9 F (37.2 C) 99.8 F (37.7 C)  TempSrc:  Oral Oral Oral  Resp:  14 18 20   Weight:      SpO2:  93% 94% 96%      Intake/Output from previous day: I/O last 3 completed shifts: In: [id number] [P.O.:240; I.V.:1500] Out: 1050 [Urine:650; Blood:400]   Intake/Output this shift:     LABORATORY DATA:  Recent Labs  01/06/16 0312  WBC 8.2  HGB 10.5*  HCT 34.1*  PLT 241  NA 138  K 3.3*  CL 104  CO2 26  BUN 10  CREATININE 1.31*  GLUCOSE 140*  CALCIUM 8.4*    Examination: Neurologically intact ABD soft Neurovascular intact Sensation intact distally Intact pulses distally Dorsiflexion/Plantar flexion intact Incision: dressing C/D/I No cellulitis present Compartment soft}  Assessment:   1 Day Post-Op Procedure(s) (LRB): TOTAL KNEE ARTHROPLASTY (Left) ADDITIONAL DIAGNOSIS: Expected Acute Blood Loss Anemia, Hypertension, mod AI, pulm HTN  Plan: PT/OT WBAT, CPM 5/hrs day until ROM 0-90 degrees, then D/C CPM DVT Prophylaxis:  SCDx72hrs, ASA 325 mg BID x 2 weeks DISCHARGE PLAN: Home, if progresses with PT DISCHARGE NEEDS: HHPT, CPM, Walker and 3-in-1 comode seat     Morgan Keinath J 01/06/2016, 6:45 AM

## 2016-01-06 NOTE — Progress Notes (Signed)
Occupational Therapy Evaluation Patient Details Name: Chelsea [name] MRN: [id number] DOB: 1951/03/24 Today's Date: 01/06/2016    History of Present Illness 65 y.o. female admitted to [facility] on 01/05/16 for elective L TKA.  Pt with significant PMHx of stroke with residual left sided weakness, HTN, mild to moderate aortic insufficiency, depression/anxiety, and obesity.     Clinical Impression   PTA, pt was independent with ADLs and used a SPC for mobility due to residual L sided weakness from previous CVA. Pt currently requires mod assist for functional transfers and ambulation and mod assist for LB ADLs. Reviewed knee precautions, importance of elevating LLE, and began education for compensatory strategies for ADLs. Had lengthy discussion with pt and family members regarding discharge options and plan - pt open to various options, but son reports that pt's other son is not reliable and cannot provide consistent care. Currently, recommending SNF for post-acute rehab stay. Pt will benefit from continued acute OT to increase independence and safety with ADLs and mobility. Will continue to follow acutely.    Follow Up Recommendations  SNF;Supervision/Assistance - 24 hour    Equipment Recommendations  3 in 1 bedside comode;Tub/shower seat;Other (comment) (RW-2 wheeled)    Recommendations for Other Services       Precautions / Restrictions Precautions Precautions: Fall;Knee Precaution Booklet Issued: No Precaution Comments: Reviewed nor keeping pillow or other object under knee Restrictions Weight Bearing Restrictions: Yes LLE Weight Bearing: Weight bearing as tolerated      Mobility Bed Mobility Overal bed mobility: Needs Assistance Bed Mobility: Supine to Sit     Supine to sit: Min assist     General bed mobility comments: Pt up in chair on OT arrival  Transfers Overall transfer level: Needs assistance Equipment used: Rolling walker (2 wheeled) Transfers: Sit to/from Stand Sit to  Stand: Mod assist Stand pivot transfers: Mod assist       General transfer comment: x3 - Mod assist for boost to stand and to stabilize RW for first 2 transfers, but pt reached closer to min assist level by 3rd sit-stand. Pt with narrow BOS and verbal cues to widen stance. Verbal cues for safe hand placement and RW use.    Balance Overall balance assessment: Needs assistance Sitting-balance support: No upper extremity supported;Feet supported Sitting balance-Leahy Scale: Fair     Standing balance support: Bilateral upper extremity supported;During functional activity Standing balance-Leahy Scale: Poor Standing balance comment: Reliant on UE support to maintain static and dynamic standing balance                            ADL Overall ADL's : Needs assistance/impaired                     Lower Body Dressing: Moderate assistance;Cueing for compensatory techniques;Cueing for safety;Sitting/lateral leans Lower Body Dressing Details (indicate cue type and reason): Pt unable to reach bilateral feet Toilet Transfer: Moderate assistance;Cueing for safety;Cueing for sequencing;Ambulation;BSC;RW Toilet Transfer Details (indicate cue type and reason): BSC placed just inside bathroom, cues for step sequence, hand placement and to feel BSC on back of legs Toileting- Clothing Manipulation and Hygiene: Minimal assistance;Sitting/lateral lean Toileting - Clothing Manipulation Details (indicate cue type and reason): Able to complete front pericare while sitting, min assist to move gown before sitting although pt able to assist with this as well     Functional mobility during ADLs: Moderate assistance;Rolling walker;Cueing for sequencing General ADL Comments: Reviewed knee precautions,  importance of elevating LLE, and began education on compensatory strategies for ADLs.     Vision Vision Assessment?: No apparent visual deficits   Perception     Praxis      Pertinent  Vitals/Pain Pain Assessment: 0-10 Pain Score: 5  Pain Location: L knee Pain Descriptors / Indicators: Aching;Sore Pain Intervention(s): Limited activity within patient's tolerance;Monitored during session;Premedicated before session;Repositioned;Ice applied     Hand Dominance Right   Extremity/Trunk Assessment Upper Extremity Assessment Upper Extremity Assessment: LUE deficits/detail LUE Deficits / Details: STRENGTH: wfl very slight weakness, AROM wfl, SENSATION: intact, pt noted to have tremor in hand and arm during activity LUE Coordination: decreased fine motor   Lower Extremity Assessment Lower Extremity Assessment: LLE deficits/detail LLE Deficits / Details: Decreased AROM and strength as expected post op. Some residual weakness from PMHx of CVA at baseline.   Cervical / Trunk Assessment Cervical / Trunk Assessment: Normal   Communication Communication Communication: No difficulties   Cognition Arousal/Alertness: Awake/alert Behavior During Therapy: WFL for tasks assessed/performed Overall Cognitive Status: Within Functional Limits for tasks assessed                     General Comments       Exercises Exercises: Total Joint     Shoulder Instructions      Home Living Family/patient expects to be discharged to:: Private residence Living Arrangements: Alone Available Help at Discharge: Family;Available PRN/intermittently (son in AMs and PMs after work) Type of Home: House Home Access: Stairs to enter Technical brewer of Steps: 1 Entrance Stairs-Rails: None Home Layout: One level     Bathroom Shower/Tub: Tub/shower unit;Curtain Shower/tub characteristics: Architectural technologist: Standard Bathroom Accessibility: Yes How Accessible: Accessible via walker Home Equipment: Kasandra Knudsen - single point   Additional Comments: 1 son available in AMs and PMs after work (brother says this sibling is not reliable), other son lievs in [place] and can assist  sometimes, daughter lives in [place]      Prior Functioning/Environment Level of Independence: Independent with assistive device(s)        Comments: pt walked with a cane    OT Diagnosis: Generalized weakness;Acute pain;Hemiplegia non-dominant side   OT Problem List: Decreased strength;Decreased range of motion;Decreased activity tolerance;Impaired balance (sitting and/or standing);Decreased coordination;Decreased safety awareness;Decreased knowledge of precautions;Decreased knowledge of use of DME or AE;Obesity;Pain;Impaired UE functional use   OT Treatment/Interventions: Self-care/ADL training;Therapeutic exercise;Energy conservation;DME and/or AE instruction;Therapeutic activities;Patient/family education;Balance training    OT Goals(Current goals can be found in the care plan section) Acute Rehab OT Goals Patient Stated Goal: to have a good birthday! OT Goal Formulation: With patient Time For Goal Achievement: 01/20/16 Potential to Achieve Goals: Good ADL Goals Pt Will Perform Grooming: with min guard assist;standing Pt Will Perform Lower Body Bathing: with min guard assist;with adaptive equipment;sit to/from stand;sitting/lateral leans Pt Will Perform Lower Body Dressing: with min guard assist;sitting/lateral leans;with adaptive equipment;sit to/from stand Pt Will Transfer to Toilet: with min guard assist;ambulating;bedside commode (BSC over toilet) Pt Will Perform Toileting - Clothing Manipulation and hygiene: with min guard assist;sitting/lateral leans;sit to/from stand Pt Will Perform Tub/Shower Transfer: Tub transfer;with min assist;ambulating;shower seat;rolling walker  OT Frequency: Min 3X/week   Barriers to D/C: Decreased caregiver support  Son(s) only available before and after work - 1 son unreliable according to other son       Co-evaluation              End of Session Equipment Utilized During Treatment: Gait belt;Rolling  walker CPM Left Knee CPM Left Knee:  Off Additional Comments: elevated L heel Nurse Communication: Mobility status  Activity Tolerance: Patient limited by fatigue Patient left: in chair;with call bell/phone within reach;with family/visitor present   Time: 1215-1252 OT Time Calculation (min): 37 min Charges:  OT General Charges $OT Visit: 1 Procedure OT Evaluation $OT Eval Moderate Complexity: 1 Procedure OT Treatments $Self Care/Home Management : 8-22 mins G-Codes:    Redmond Baseman, OTR/L Pager: [phone] 01/06/2016, 1:49 PM

## 2016-01-06 NOTE — Progress Notes (Signed)
Physical Therapy Treatment Patient Details Name: Chelsea [name] MRN: [id number] DOB: December 28, 1950 Today's Date: 01/06/2016    History of Present Illness 65 y.o. female admitted to [facility] on 01/05/16 for elective L TKA.  Pt with significant PMHx of stroke with residual left sided weakness, HTN, mild to moderate aortic insufficiency, depression/anxiety, and obesity.      PT Comments    Pt limited by pain this session and unable to ambulate due to Lt knee buckling. Pt will benefit from [facility] for safe gait training. Continue to progress as tolerated.   Follow Up Recommendations  Home health PT;Supervision/Assistance - 24 hour     Equipment Recommendations  Rolling walker with 5" wheels;3in1 (PT)    Recommendations for Other Services       Precautions / Restrictions Precautions Precautions: Fall;Knee Precaution Booklet Issued: Yes (comment) Precaution Comments: knee hanout given Restrictions Weight Bearing Restrictions: Yes LLE Weight Bearing: Weight bearing as tolerated    Mobility  Bed Mobility Overal bed mobility: Needs Assistance Bed Mobility: Supine to Sit     Supine to sit: Min assist     General bed mobility comments: R LE assist with bringing L LE to EOB with cues for technique; HOB flat and min use of bedrails; min A to elevate trunk into sitting; vc for sequencing  Transfers Overall transfer level: Needs assistance Equipment used: Rolling walker (2 wheeled) Transfers: Sit to/from Stand Sit to Stand: Mod assist Stand pivot transfers: Mod assist       General transfer comment: mod A for power up into standing from EOB with vc for hand placement and technique; vc for posture and engaging L quad to better stabilize for WS to L LE; pt with L knee flexion in standing; mod A to guide RW, WS to L LE, and maintain balance for stand pivot; L knee buckled with each step   Ambulation/Gait             General Gait Details: unable due to L knee buckling and c/o pain   Stairs            Wheelchair Mobility    Modified Rankin (Stroke Patients Only)       Balance Overall balance assessment: Needs assistance Sitting-balance support: Feet supported;Single extremity supported Sitting balance-Leahy Scale: Good     Standing balance support: Bilateral upper extremity supported Standing balance-Leahy Scale: Poor                      Cognition Arousal/Alertness: Awake/alert Behavior During Therapy: WFL for tasks assessed/performed Overall Cognitive Status: Within Functional Limits for tasks assessed                      Exercises Total Joint Exercises Quad Sets: AROM;Both;10 reps;Supine Long Arc Quad: AROM;Left;5 reps;Seated Knee Flexion: AROM;Left;5 reps;Seated (with towel under foot for sliding) Goniometric ROM: 0-60    General Comments        Pertinent Vitals/Pain Pain Assessment: 0-10 Pain Score: 8  Pain Location: L knee Pain Descriptors / Indicators: Sore;Sharp Pain Intervention(s): Limited activity within patient's tolerance;Monitored during session;Premedicated before session;Repositioned;Ice applied    Home Living                      Prior Function            PT Goals (current goals can now be found in the care plan section) Acute Rehab PT Goals Patient Stated Goal: to get back to  walking PT Goal Formulation: With patient Time For Goal Achievement: 01/12/16 Potential to Achieve Goals: Good Progress towards PT goals: Not progressing toward goals - comment (limited by pain)    Frequency  7X/week    PT Plan Current plan remains appropriate    Co-evaluation             End of Session Equipment Utilized During Treatment: Gait belt Activity Tolerance: Patient limited by pain Patient left: in chair;with call bell/phone within reach;with family/visitor present     Time: [id number] PT Time Calculation (min) (ACUTE ONLY): 47 min  Charges:  $Therapeutic Exercise: 8-22 mins $Therapeutic  Activity: 23-37 mins                    G Codes:      Salina April, PTA Pager: [phone]   01/06/2016, 11:44 AM

## 2016-01-06 NOTE — Care Management Note (Signed)
Case Management Note  Patient Details  Name: Chelsea [name] MRN: [id number] Date of Birth: Feb 06, 1951  Subjective/Objective:            S/p left total knee arthroplasty        Action/Plan: Spoke with patient and her son about discharge plan. They selected [facility]. Contacted Marie at [employer] and set up [place]. [employer] will deliver CPM to patient's home, rolling walker and 3N1 to patient's room. Patient stated that her sons and grandchildren will be able to assist her after discharge.     Expected Discharge Date:                  Expected Discharge Plan:  [place]  In-House Referral:  NA  Discharge planning Services  CM Consult  Post Acute Care Choice:  Durable Medical Equipment, Home Health Choice offered to:  Patient  DME Arranged:  3-N-1, CPM DME Agency:  [employer]  HH Arranged:  PT [place] Agency:  [facility]  Status of Service:  Completed, signed off  Medicare Important Message Given:    Date Medicare IM Given:    Medicare IM give by:    Date Additional Medicare IM Given:    Additional Medicare Important Message give by:     If discussed at [place] of Stay Meetings, dates discussed:    Additional Comments:  Nila Nephew, RN 01/06/2016, 12:27 PM

## 2016-01-06 NOTE — Progress Notes (Signed)
Physical Therapy Treatment Patient Details Name: Chelsea [name] MRN: [id number] DOB: [birth year] Today's Date: 01/06/2016    History of Present Illness 65 y.o. female admitted to [facility] on 01/05/16 for elective L TKA.  Pt with significant PMHx of stroke with residual left sided weakness, HTN, mild to moderate aortic insufficiency, depression/anxiety, and obesity.      PT Comments    Pt is requiring mod assist just to transfer to [facility].  She has been unable to ambulate for me even short distance, in room.  She is not progressing well enough to d/c home safely at this time, so I am updating my recommendation to SNF for rehab at discharge.  Pt is still hopeful that she can progress better tomorrow if her pain has decreased.  I called Dr. Damita Dunnings office and asked for an order for a KI for her to help with knee stability during mobility.    Follow Up Recommendations  SNF     Equipment Recommendations  Rolling walker with 5" wheels;3in1 (PT)    Recommendations for Other Services   NA     Precautions / Restrictions Precautions Precautions: Fall;Knee Precaution Booklet Issued: Yes (comment) Precaution Comments: knee hanout given yesterday Restrictions LLE Weight Bearing: Weight bearing as tolerated    Mobility  Bed Mobility Overal bed mobility: Needs Assistance Bed Mobility: Supine to Sit;Sit to Supine     Supine to sit: Min assist Sit to supine: Min assist   General bed mobility comments: Min assist to help progress left leg to EOB. Verbal cues for hand placement and sequencing.  HOB elevated and pt needed significant extra time to complete the task.  Transfers Overall transfer level: Needs assistance Equipment used: Rolling walker (2 wheeled) Transfers: Sit to/from [facility] Sit to Stand: Mod assist;From elevated surface Stand pivot transfers: Mod assist;From elevated surface       General transfer comment: Mod assist to stand from elelvated bed to [facility] and back to  bed.  Pt with significant flexed knee posture on left leg during stance.  Left knee buckling with attempts to step around with RW.  Pt unable, due to left arm weakness, to support her trunk on her arms to prevent buckling.  MD paged to ask for KI to help with left knee stability during standing and attempts at gait.   Ambulation/Gait             General Gait Details: unble at this time.  Pt reports too painful and pt is not strong enough yet as left knee is buckling.           Balance Overall balance assessment: Needs assistance Sitting-balance support: Feet supported;Bilateral upper extremity supported Sitting balance-Leahy Scale: Fair     Standing balance support: Bilateral upper extremity supported Standing balance-Leahy Scale: Poor                      Cognition Arousal/Alertness: Awake/alert Behavior During Therapy: WFL for tasks assessed/performed Overall Cognitive Status: Within Functional Limits for tasks assessed                      Exercises Total Joint Exercises Ankle Circles/Pumps: AROM;AAROM;Both;20 reps Quad Sets: AROM;Left;10 reps Towel Squeeze: AROM;Left;10 reps Heel Slides: AAROM;Left;10 reps        Pertinent Vitals/Pain Pain Assessment: 0-10 Pain Score: 8  Pain Location: left knee Pain Descriptors / Indicators: Aching;Burning;Constant Pain Intervention(s): Monitored during session;Limited activity within patient's tolerance;Repositioned;Patient requesting pain meds-RN notified;RN gave  pain meds during session    Home Living Family/patient expects to be discharged to:: Private residence Living Arrangements: Alone                      PT Goals (current goals can now be found in the care plan section) Acute Rehab PT Goals Patient Stated Goal: to decrease pain so she can walk better Progress towards PT goals: Progressing toward goals (very slowly)    Frequency  7X/week    PT Plan Discharge plan needs to be updated        End of Session Equipment Utilized During Treatment: Gait belt Activity Tolerance: Patient limited by pain Patient left: in bed;with call bell/phone within reach;with bed alarm set;in CPM     Time: [id number] PT Time Calculation (min) (ACUTE ONLY): 36 min  Charges:  $Therapeutic Exercise: 8-22 mins $Therapeutic Activity: 8-22 mins                     Chelsea [name] B. [place], [place], DPT [phone]   01/06/2016, 4:39 PM

## 2016-01-07 LAB — CBC
HCT: 32.2 % — ABNORMAL LOW (ref 36.0–46.0)
Hemoglobin: 10.1 g/dL — ABNORMAL LOW (ref 12.0–15.0)
MCH: 28 pg (ref 26.0–34.0)
MCHC: 31.4 g/dL (ref 30.0–36.0)
MCV: 89.2 fL (ref 78.0–100.0)
Platelets: 247 10*3/uL (ref 150–400)
RBC: 3.61 MIL/uL — ABNORMAL LOW (ref 3.87–5.11)
RDW: 15.3 % (ref 11.5–15.5)
WBC: 13.6 10*3/uL — ABNORMAL HIGH (ref 4.0–10.5)

## 2016-01-07 NOTE — Progress Notes (Signed)
Physical Therapy Treatment Patient Details Name: Chelsea [name] MRN: [id number] DOB: 1951/10/16 Today's Date: 01/07/2016    History of Present Illness 65 y.o. female admitted to [facility] on 01/05/16 for elective L TKA.  Pt with significant PMHx of stroke with residual left sided weakness, HTN, mild to moderate aortic insufficiency, depression/anxiety, and obesity.      PT Comments    Per pt report, she will have 24/7 care from ex-husband if discharged home. Due to pt presentation this AM, PT continues to recommend SNF upon d/c due to pt requiring +2 mod assist in transferring sit to stand and in ambulation. Pt is making progress compared to yesterday, but prior to changing recommended d/c location to home with one person to help, PT needs to see continued progress with transfers and ambulation.   Follow Up Recommendations  SNF     Equipment Recommendations  Rolling walker with 5" wheels;3in1 (PT)       Precautions / Restrictions Precautions Precautions: Fall;Knee Restrictions Weight Bearing Restrictions: Yes LLE Weight Bearing: Weight bearing as tolerated    Mobility  Bed Mobility Overal bed mobility:  (pt received and returned to recliner )                Transfers Overall transfer level: Needs assistance Equipment used: Rolling walker (2 wheeled) Transfers: Sit to/from Stand Sit to Stand: Mod assist;+2 physical assistance         General transfer comment: Pt requires verbal cues for hand placement prior to pushing up. Pt requires +2 heavy mod assist to transfer from sit to stand. Pt took multiple attempts to stand, and used some momentum to push forward and up. Once standing, pt's LLE and LUE have noted weakness, which was normal at her baseline due to CVA.  Ambulation/Gait Ambulation/Gait assistance: Mod assist;+2 physical assistance Ambulation Distance (Feet): 25 Feet Assistive device: Rolling walker (2 wheeled) Gait Pattern/deviations: Step-to pattern;Antalgic;Decreased  weight shift to left Gait velocity: decreased    General Gait Details: Pt able to ambulate with near constant cues for gait pattern and sequencing. Pt requires verbal cues to get the RW out in front of her so that she has space to take a full step into the base. As pt fatigues, her LUE and LLE become shaky and L knee buckles when weight bearing. Pt requires +2 mod assist for physical assistance on both sides and the recliner chair followed closely behind for safety.             Balance Overall balance assessment: Needs assistance Sitting-balance support: Feet supported;Single extremity supported Sitting balance-Leahy Scale: Fair Sitting balance - Comments: Pt tends to hold on with RUE to armrest in sitting to help maintain balance as her LUE is weak.   Standing balance support: Bilateral upper extremity supported Standing balance-Leahy Scale: Poor Standing balance comment: Requires bilateral UE support on RW to  maintain standing balance                     Cognition Arousal/Alertness: Awake/alert Behavior During Therapy: WFL for tasks assessed/performed Overall Cognitive Status: Within Functional Limits for tasks assessed                      Exercises Total Joint Exercises Ankle Circles/Pumps: AROM;Both;20 reps Quad Sets: AROM;Left;10 reps Towel Squeeze: AROM;Both;10 reps Heel Slides: AAROM;Left;10 reps    General Comments General comments (skin integrity, edema, etc.): Per pt report, if she discharges home, her ex-husband will be available 24/7  to care for her. Her preference is to d/c home with his help instead of SNF. Due to pt presentation, PT continues to recommend SNF as d/c location due to needing +2 mod assist in transfers and gait. Pt's ambulation progress is significant today compared to yesterday, but need to see continued progress in mobility prior to recommending d/c location to home with one person to help.      Pertinent Vitals/Pain Pain  Assessment: 0-10 Pain Score: 7  Pain Location: Left knee and headache  Pain Descriptors / Indicators: Aching Pain Intervention(s): Limited activity within patient's tolerance;Monitored during session;Repositioned           PT Goals (current goals can now be found in the care plan section) Acute Rehab PT Goals Patient Stated Goal: decrease pain and d/c home  PT Goal Formulation: With patient Time For Goal Achievement: 01/12/16 Potential to Achieve Goals: Good Progress towards PT goals: Progressing toward goals    Frequency  7X/week    PT Plan Current plan remains appropriate       End of Session Equipment Utilized During Treatment: Gait belt Activity Tolerance: Patient limited by pain Patient left: in chair;with call bell/phone within reach     Time: 1000-1034 PT Time Calculation (min) (ACUTE ONLY): 34 min  Charges:    1 therapeutic exercise, 1 gait           [place], [place] office [name] 01/07/2016, 10:56 AM

## 2016-01-07 NOTE — Progress Notes (Signed)
Physical Therapy Treatment Patient Details Name: Chelsea [name] MRN: [id number] DOB: 11/28/1951 Today's Date: 01/07/2016    History of Present Illness 66 y.o. female admitted to [facility] on 01/05/16 for elective L TKA.  Pt with significant PMHx of stroke with residual left sided weakness, HTN, mild to moderate aortic insufficiency, depression/anxiety, and obesity.      PT Comments    Pt requires +2 mod assist to transfer and ambulate safely. Pt's ex-husband was present during PT tx, and practiced sit to stand transfers as one of the +2. Then ex-husband and pt practiced a sit to stand transfer on their own with PT min guard for safety. It took them three attempts to get her to standing, and pt was very shaky. To ambulate, pt requires +2 mod assist for safety. Ex-husband and pt practiced ambulation with PT min guard assist for safety and a recliner chair following closely behind. As pt fatigues, her L knee begins to buckle when weight bearing and pt requires verbal cues for sequencing. Per pt and ex-husband's report, they can enter through the back door, which is a level entry. PT will continue to recommend d/c to SNF for rehab due to deficits noted below.   Follow Up Recommendations  SNF     Equipment Recommendations  Rolling walker with 5" wheels;3in1 (PT)       Precautions / Restrictions Precautions Precautions: Fall;Knee Precaution Comments: knee hanout given yesterday Restrictions Weight Bearing Restrictions: Yes LLE Weight Bearing: Weight bearing as tolerated    Mobility  Bed Mobility Overal bed mobility:  (pt received and returned to recliner)                Transfers Overall transfer level: Needs assistance Equipment used: Rolling walker (2 wheeled) Transfers: Sit to/from Stand Sit to Stand: Mod assist;+2 physical assistance         General transfer comment: Pt requires verbal cues for hand placement prior to pushing up. Pt counts to three and uses momentum to push up to  standing from sitting. Pt requires heavy mod assist with verbal cues to remind her to push with her arms (particularly with her RUE as her LUE is weak). Pt practiced STS transfer with ex-husband as he will be caring for her once home. Worked best for him to assist from R side as he is "pulling" her to standing on her strong side. When he completed the trasnfer with her, another person was right next to her for safety as it took them three attempts to get her to standing.  Ambulation/Gait Ambulation/Gait assistance: +2 physical assistance;Mod assist Ambulation Distance (Feet): 25 Feet Assistive device: Rolling walker (2 wheeled) Gait Pattern/deviations: Step-to pattern;Decreased weight shift to left;Antalgic Gait velocity: decreased  Gait velocity interpretation: Below normal speed for age/gender General Gait Details: For safety, pt requires +2 mod assist to ambulate with verbal cues for sequencing of RW and steps. Pt requries cues to put RW out far enough in front of her to create space to step. Pt practiced ambulation with ex-husband, and he was educated on providing ambulation assist on her L side as her knee buckles when WB. A second person was on the other side of the pt for safety with a third person bringing the recliner chair to follow closely.           Balance Overall balance assessment: Needs assistance Sitting-balance support: Single extremity supported;Feet supported Sitting balance-Leahy Scale: Fair Sitting balance - Comments: Pt tends to hold on with RUE to armrest  in sitting to help maintain balance as her LUE is weak.   Standing balance support: Bilateral upper extremity supported Standing balance-Leahy Scale: Poor Standing balance comment: Requires bilateral UE support on RW to maintain standing balance                    Cognition Arousal/Alertness: Awake/alert Behavior During Therapy: WFL for tasks assessed/performed Overall Cognitive Status: Within Functional  Limits for tasks assessed                      Exercises Total Joint Exercises Short Arc QuadSinclair Ship;Left;10 reps Hip ABduction/ADduction: AAROM;Left;10 reps Straight Leg Raises: AAROM;Left;10 reps    General Comments        Pertinent Vitals/Pain Pain Assessment: 0-10 Pain Score: 7  Pain Location: L knee Pain Descriptors / Indicators: Aching Pain Intervention(s): Limited activity within patient's tolerance;Monitored during session;Repositioned           PT Goals (current goals can now be found in the care plan section) Acute Rehab PT Goals Patient Stated Goal: d/c home  PT Goal Formulation: With patient Time For Goal Achievement: 01/12/16 Potential to Achieve Goals: Good Progress towards PT goals: Progressing toward goals    Frequency  7X/week    PT Plan Current plan remains appropriate       End of Session Equipment Utilized During Treatment: Gait belt Activity Tolerance: Patient limited by pain Patient left: in chair;with call bell/phone within reach;with family/visitor present     Time: [id number] PT Time Calculation (min) (ACUTE ONLY): 37 min  Charges:    1 therapeutic exercise, 1 gait                     [place], [place] office [name] 01/07/2016, 4:02 PM

## 2016-01-07 NOTE — Progress Notes (Addendum)
Subjective: 2 Days Post-Op Procedure(s) (LRB): TOTAL KNEE ARTHROPLASTY (Left).  Patient is resting comfortably in bed this morning. She is still sore and weak. PT is now recommending SNF instead of HH and Home PT. Patient states that she wants to try to do better with PT this morning and that her ex husband will help take care of her.  Activity level:  wbat Diet tolerance:  ok Voiding:  ok Patient reports pain as mild and moderate.    Objective: Vital signs in last 24 hours: Temp:  [98.4 F (36.9 C)-99.1 F (37.3 C)] 98.5 F (36.9 C) (02/08 0358) Pulse Rate:  [87-107] 87 (02/08 0358) Resp:  [16-20] 16 (02/08 0358) BP: (106-155)/(62-91) 128/71 mmHg (02/08 0358) SpO2:  [93 %-97 %] 93 % (02/08 0358)  Labs:  Recent Labs  01/06/16 0312  HGB 10.5*    Recent Labs  01/06/16 0312  WBC 8.2  RBC 3.79*  HCT 34.1*  PLT 241    Recent Labs  01/06/16 0312  NA 138  K 3.3*  CL 104  CO2 26  BUN 10  CREATININE 1.31*  GLUCOSE 140*  CALCIUM 8.4*   No results for input(s): LABPT, INR in the last 72 hours.  Physical Exam:  Neurologically intact ABD soft Neurovascular intact Sensation intact distally Intact pulses distally Dorsiflexion/Plantar flexion intact Incision: dressing C/D/I and no drainage No cellulitis present Compartment soft  Assessment/Plan:  2 Days Post-Op Procedure(s) (LRB): TOTAL KNEE ARTHROPLASTY (Left) Advance diet Up with therapy Plan for discharge tomorrow Discharge to SNF per the reccomendations of PT at this time. We will see how she does this morning and if she does well  And her ex husband can help her at home we will send her home today. I will check back at lunch time about this. Follow up with Dr. Mayer Camel in office 2 weeks post op. Continue on ASA 325mg  BID x 2 weeks post op for dvt prevention.   Arielys Wandersee, Larwance Sachs 01/07/2016, 7:39 AM

## 2016-01-07 NOTE — Clinical Social Work Note (Signed)
CSW met with patient to discuss going to SNF for short term rehab.  Patient stated she does not want to go to SNF even though PT is recommending that she goes to SNF.  Patient stated her ex husband will be staying with her and he is on disability and does not work.  Patient states she has spoken to him and he is in agreement to having her stay with her.  CSW to sign off please reconsult if other social work needs arise.  Jones Broom. [place], MSW, [place] 01/07/2016 12:42 PM

## 2016-01-08 LAB — CBC
HCT: 31 % — ABNORMAL LOW (ref 36.0–46.0)
Hemoglobin: 10 g/dL — ABNORMAL LOW (ref 12.0–15.0)
MCH: 28.4 pg (ref 26.0–34.0)
MCHC: 32.3 g/dL (ref 30.0–36.0)
MCV: 88.1 fL (ref 78.0–100.0)
Platelets: 232 10*3/uL (ref 150–400)
RBC: 3.52 MIL/uL — ABNORMAL LOW (ref 3.87–5.11)
RDW: 15.6 % — ABNORMAL HIGH (ref 11.5–15.5)
WBC: 8.5 10*3/uL (ref 4.0–10.5)

## 2016-01-08 NOTE — Progress Notes (Signed)
Physical Therapy Treatment Patient Details Name: Chelsea [name] MRN: [id number] DOB: 1951-11-27 Today's Date: 01/08/2016    History of Present Illness 65 y.o. female admitted to [facility] on 01/05/16 for elective L TKA.  Pt with significant PMHx of stroke with residual left sided weakness, HTN, mild to moderate aortic insufficiency, depression/anxiety, and obesity.      PT Comments    Pt requires min assist for bed mobility to advance LLE to EOB. Pt requires +2 mod assist for safety to transfer from sit to stand with verbal cues for hand placement. Pt counts to three and uses momentum to help sit to stand transfer. Pt transitioned from a +2 mod assist in ambulation to a +1 mod assist midway through her 30 ft walk. Pt took one seated rest break in the recliner that was closely following the pt throughout ambulation. Pt reports she wants to d/c home and has help from her ex-husband. PT will see pt this PM ideally with ex-husband present to practice transfers and ambulation as we did with him yesterday afternoon, but it was shaky and needs practice for safety.     Follow Up Recommendations  SNF     Equipment Recommendations  Rolling walker with 5" wheels;3in1 (PT)       Precautions / Restrictions Precautions Precautions: Fall;Knee Restrictions Weight Bearing Restrictions: Yes LLE Weight Bearing: Weight bearing as tolerated    Mobility  Bed Mobility Overal bed mobility: Needs Assistance Bed Mobility: Supine to Sit     Supine to sit: Min assist     General bed mobility comments: Min assist to help advance LLE to EOB. Pt requires verbal cues for hand palcement and to "find the floor" with her R foot once sitting EOB.  Transfers Overall transfer level: Needs assistance Equipment used: Rolling walker (2 wheeled) Transfers: Sit to/from Stand Sit to Stand: Mod assist;+2 physical assistance Stand pivot transfers: Mod assist;+2 physical assistance       General transfer comment: Pt requires  verbal cues for hand placement to push up. Pt uses momentum to push up and counts to three. Pt requires verbal cues to "push, push, push" with her hands with transferring from sit to stand. This AM, pt still required +2 mod assist for physical assistance and safety, but she was a lighter +2 mod assist than yesterday PM.  Ambulation/Gait Ambulation/Gait assistance: +2 physical assistance;Mod assist Ambulation Distance (Feet): 30 Feet Assistive device: Rolling walker (2 wheeled) Gait Pattern/deviations: Step-to pattern;Antalgic;Decreased weight shift to left Gait velocity: decreased  Gait velocity interpretation: Below normal speed for age/gender General Gait Details: For safety, pt requires +2 mod assist to ambulate with continued verbal cues for sequencing of RW and steps. Pt requires verbal cues to remind her to advance the RW far enough in front of her to to create space to step into. Towards end of walk, we transistioned to +1 mod assist with a second person close by and available if needed. The recliner was followed close behind throughout the entire walk. Pt took one seated rest break approx 8 ft into the walk.           Balance Overall balance assessment: Needs assistance Sitting-balance support: Single extremity supported;Feet supported Sitting balance-Leahy Scale: Fair     Standing balance support: Bilateral upper extremity supported Standing balance-Leahy Scale: Poor Standing balance comment: Requires bilateral UE support on RW                     Cognition Arousal/Alertness: Awake/alert  Behavior During Therapy: [facility] for tasks assessed/performed Overall Cognitive Status: Within Functional Limits for tasks assessed                      Exercises Total Joint Exercises Long Arc Quad: AAROM;Left;10 reps;Seated Knee Flexion: AROM;AAROM;Left;10 reps (AAROM with assist from RLE - completed 5 reps) Goniometric ROM: L knee flexion: 70 degrees; L knee extension:  lacking 7 degrees from full extension        Pertinent Vitals/Pain Pain Assessment: 0-10 Pain Score: 7  Pain Location: L knee Pain Descriptors / Indicators: Aching;Dull;Throbbing Pain Intervention(s): Limited activity within patient's tolerance;Monitored during session;Repositioned           PT Goals (current goals can now be found in the care plan section) Acute Rehab PT Goals Patient Stated Goal: d/c home  PT Goal Formulation: With patient Time For Goal Achievement: 01/12/16 Potential to Achieve Goals: Good Progress towards PT goals: Progressing toward goals    Frequency  7X/week    PT Plan Current plan remains appropriate       End of Session Equipment Utilized During Treatment: Gait belt Activity Tolerance: Patient limited by pain Patient left: in chair;with call bell/phone within reach     Time: 0759-0843 PT Time Calculation (min) (ACUTE ONLY): 44 min  Charges:    1 therapeutic exercise, 2 gait          [place], [place] office  [name] 01/08/2016, 9:32 AM

## 2016-01-08 NOTE — Progress Notes (Signed)
   PATIENT ID: Chelsea [name]   3 Days Post-Op Procedure(s) (LRB): TOTAL KNEE ARTHROPLASTY (Left)  Subjective: Sitting up in chair, comfortable. Anxious to go home. Does not want to go to SNF as rec by PT. Tells me she will have help at home.   Objective:  Filed Vitals:   01/08/16 0500 01/08/16 0846  BP: 128/75 124/71  Pulse: 93   Temp: 98.5 F (36.9 C)   Resp: 18     Neurologically intact Neurovascular intact Sensation intact distally Intact pulses distally Dorsiflexion/Plantar flexion intact Incision: dressing C/D/I and no drainage No cellulitis present Compartment soft  Labs:   Recent Labs  01/06/16 0312 01/07/16 0629 01/08/16 0729  HGB 10.5* 10.1* 10.0*   Recent Labs  01/07/16 0629 01/08/16 0729  WBC 13.6* 8.5  RBC 3.61* 3.52*  HCT 32.2* 31.0*  PLT 247 232   Recent Labs  01/06/16 0312  NA 138  K 3.3*  CL 104  CO2 26  BUN 10  CREATININE 1.31*  GLUCOSE 140*  CALCIUM 8.4*    Assessment and Plan: 3 Days Post-Op Procedure(s) (LRB): TOTAL KNEE ARTHROPLASTY (Left) Advance diet Up with therapy D/c home today Refuses to go to SNF, home health set up and will be will ex-husband with 24 hr care Follow up with Dr. Mayer Camel in office 2 weeks post op. Continue on ASA 325mg  BID x 2 weeks post op for dvt prevention.

## 2016-01-08 NOTE — Progress Notes (Signed)
Physical Therapy Treatment Patient Details Name: Chelsea [name] MRN: [id number] DOB: 09-21-1951 Today's Date: 01/08/2016    History of Present Illness 65 y.o. female admitted to [facility] on 01/05/16 for elective L TKA.  Pt with significant PMHx of stroke with residual left sided weakness, HTN, mild to moderate aortic insufficiency, depression/anxiety, and obesity.      PT Comments    Pt's ex-husband was present and practiced STS and ambulation with pt. PT was close on other side of pt should they need help. Pt requires mod assist to transfer from sit to stand with verbal cues for hand placement. It was shaky for ex-husband to get her to standing, and it took them 2 attempts. Once standing, pt demonstrated a smoother gait pattern in ambulation today compared to yesterday with her ex-husband.   Follow Up Recommendations  SNF     Equipment Recommendations  Rolling walker with 5" wheels;3in1 (PT)       Precautions / Restrictions Precautions Precautions: Fall;Knee Restrictions Weight Bearing Restrictions: Yes LLE Weight Bearing: Weight bearing as tolerated    Mobility  Bed Mobility Overal bed mobility:  (pt received and returned to recliner)                Transfers Overall transfer level: Needs assistance Equipment used: Rolling walker (2 wheeled) Transfers: Sit to/from Stand Sit to Stand: Mod assist         General transfer comment: Pt demonstrated ability to transfer from sit to stand with mod assist from ex-husband, but it was shaky and took multiple attempts. PT was close on other side of pt if they were to need help. It took two attempts, but pt was able to transfer by using momentum and counting to three. Pt continues to requrie verbal cues for hand placement.  Ambulation/Gait Ambulation/Gait assistance: Mod assist Ambulation Distance (Feet): 35 Feet Assistive device: Rolling walker (2 wheeled) Gait Pattern/deviations: Step-to pattern;Decreased weight shift to  left;Antalgic Gait velocity: decreased  Gait velocity interpretation: Below normal speed for age/gender General Gait Details: Pt ambulated with mod assist with help from her ex-husband. PT was on other side of pt should they need help. Both pt and ex-husband verbalized they felt comfortable with both transfers and ambulation. Pt requires verbal cues to get the walker out in front of her so that she will have space to take a full step forward.          Balance Overall balance assessment: Needs assistance Sitting-balance support: Feet supported;Single extremity supported Sitting balance-Leahy Scale: Fair     Standing balance support: Bilateral upper extremity supported Standing balance-Leahy Scale: Poor Standing balance comment: Requires bilateral UE support on RW                    Cognition Arousal/Alertness: Awake/alert Behavior During Therapy: WFL for tasks assessed/performed Overall Cognitive Status: Within Functional Limits for tasks assessed                      Exercises Total Joint Exercises Ankle Circles/Pumps: AROM;Both;20 reps Heel Slides: AAROM;Left;10 reps        Pertinent Vitals/Pain Pain Assessment: 0-10 Pain Score: 6  Pain Location: L knee Pain Descriptors / Indicators: Aching Pain Intervention(s): Limited activity within patient's tolerance;Monitored during session;Repositioned           PT Goals (current goals can now be found in the care plan section) Acute Rehab PT Goals Patient Stated Goal: d/c home  PT Goal Formulation: With patient  Time For Goal Achievement: 01/12/16 Potential to Achieve Goals: Good Progress towards PT goals: Progressing toward goals    Frequency  7X/week    PT Plan Current plan remains appropriate       End of Session Equipment Utilized During Treatment: Gait belt Activity Tolerance: Patient limited by pain Patient left: in chair;with call bell/phone within reach;with family/visitor present      Time: 1416-1440 PT Time Calculation (min) (ACUTE ONLY): 24 min  Charges:    2 gait                     [place], [place] office  Chelsea [name] 01/08/2016, 4:14 PM

## 2016-01-08 NOTE — Progress Notes (Signed)
Occupational Therapy Treatment Patient Details Name: Chelsea [name] MRN: [id number] DOB: 13-Mar-1951 Today's Date: 01/08/2016    History of present illness 65 y.o. female admitted to [facility] on 01/05/16 for elective L TKA.  Pt with significant PMHx of stroke with residual left sided weakness, HTN, mild to moderate aortic insufficiency, depression/anxiety, and obesity.     OT comments  Pt making progress toward goals. States she will have 24/7 assistance at home. Educated on home safety, compensatory techniques for ADL and functional mobility. Discussed need for [facility] with case manager.   Follow Up Recommendations  Home health OT;Supervision/Assistance - 24 hour    Equipment Recommendations  3 in 1 bedside comode    Recommendations for Other Services      Precautions / Restrictions Precautions Precautions: Fall;Knee Restrictions Weight Bearing Restrictions: Yes LLE Weight Bearing: Weight bearing as tolerated       Mobility Bed Mobility Overal bed mobility: Needs Assistance Bed Mobility: Supine to Sit     Supine to sit: Min assist     General bed mobility comments: Pt OOB in chair. Educated pt on using sheet/belt to mobilize LLE in/out of bed  Transfers Overall transfer level: Needs assistance Equipment used: Rolling walker (2 wheeled) Transfers: Sit to/from [facility] Sit to Stand: Mod assist Stand pivot transfers: Min assist;From elevated surface       General transfer comment: Better performance from elevated surface    Balance Overall balance assessment: Needs assistance Sitting-balance support: Single extremity supported;Feet supported Sitting balance-Leahy Scale: Fair     Standing balance support: Bilateral upper extremity supported Standing balance-Leahy Scale: Poor Standing balance comment: Requires bilateral UE support on RW                    ADL                                         General ADL Comments: Completed  education on technique for tub transfer using 3 in 1. Recommended to pt to only sponge bath at this time, unless she backed up to tub with 3 in 1 turned sideways to sit in tub with LLE dangling out of tub. Pt not safe to step over tub at this time. Pt verbalized understanding. Required mod A to move from sit - stand from lower recliner. Only min A from elevated 3 in 1. Discussed importance of sitting in higher chairs at thome to increase safety with mobility. also educated on compensatory technqiues for LB ADL. Pt verbalized understadning. Also recommended for pt to use 3 in 1 by bed at night to reduce risk of falls until she is safe to ambulate bathroom distance with [facility] therapists.      Vision                     Perception     Praxis      Cognition   Behavior During Therapy: [facility] for tasks assessed/performed Overall Cognitive Status: Within Functional Limits for tasks assessed                       Extremity/Trunk Assessment               Exercises   Shoulder Instructions       General Comments      Pertinent Vitals/ Pain  Pain Assessment: 0-10 Pain Score: 8  Pain Location: L knee Pain Descriptors / Indicators: Aching Pain Intervention(s): Limited activity within patient's tolerance  Home Living                                          Prior Functioning/Environment              Frequency Min 3X/week     Progress Toward Goals  OT Goals(current goals can now be found in the care plan section)  Progress towards OT goals: Progressing toward goals  Acute Rehab OT Goals Patient Stated Goal: d/c home  OT Goal Formulation: With patient Time For Goal Achievement: 01/20/16 Potential to Achieve Goals: Good ADL Goals Pt Will Perform Grooming: with min guard assist;standing Pt Will Perform Lower Body Bathing: with min guard assist;with adaptive equipment;sit to/from stand;sitting/lateral leans Pt Will Perform Lower Body  Dressing: with min guard assist;sitting/lateral leans;with adaptive equipment;sit to/from stand Pt Will Transfer to Toilet: with min guard assist;ambulating;bedside commode Pt Will Perform Toileting - Clothing Manipulation and hygiene: with min guard assist;sitting/lateral leans;sit to/from stand Pt Will Perform Tub/Shower Transfer: Tub transfer;with min assist;ambulating;shower seat;rolling walker  Plan Discharge plan needs to be updated    Co-evaluation                 End of Session Equipment Utilized During Treatment: Gait belt;Rolling walker CPM Left Knee CPM Left Knee: Off   Activity Tolerance Patient tolerated treatment well   Patient Left in chair;with call bell/phone within reach;with chair alarm set   Nurse Communication Mobility status        Time: [id number] OT Time Calculation (min): 29 min  Charges: OT General Charges $OT Visit: 1 Procedure OT Treatments $Self Care/Home Management : 23-37 mins  Deeanne Deininger,H[place] 01/08/2016, 10:06 AM   Maurie Boettcher, OTR/L  [phone] 01/08/2016

## 2016-01-08 NOTE — Discharge Summary (Signed)
Patient ID: Chelsea [name] MRN: [id number] DOB/AGE: [birth year] 64 y.o.  Admit date: 01/05/2016 Discharge date: 01/08/2016  Admission Diagnoses:  Principal Problem:   Primary osteoarthritis of left knee   Discharge Diagnoses:  Same  Past Medical History  Diagnosis Date  . Hypertension   . Hyperlipidemia   . Cerebrovascular accident [facility])     a. 2007-->residual left sided weakness  . Chest pain   . Obesity   . Peptic ulcer disease   . Hx of hysterectomy   . Mild to Moderate Aortic Insufficiency     a. 08/2014 Echo: EF 55-60%, mild conc LVH, no rwma, Gr 1 DD, mild-mod AI, mild MR.  . Morbid obesity ([place])   . Depression   . Anxiety   . Headache   . GERD (gastroesophageal reflux disease)     Surgeries: Procedure(s): TOTAL KNEE ARTHROPLASTY on 01/05/2016   Consultants:    Discharged Condition: Improved  Hospital Course: Chelsea [name] is an 65 y.o. female who was admitted 01/05/2016 for operative treatment ofPrimary osteoarthritis of left knee. Patient has severe unremitting pain that affects sleep, daily activities, and work/hobbies. After pre-op clearance the patient was taken to the operating room on 01/05/2016 and underwent  Procedure(s): TOTAL KNEE ARTHROPLASTY.    Patient was given perioperative antibiotics: Anti-infectives    Start     Dose/Rate Route Frequency Ordered Stop   01/05/16 0930  ceFAZolin (ANCEF) 3 g in dextrose 5 % 50 mL IVPB     3 g 130 mL/hr over 30 Minutes Intravenous To ShortStay Surgical 01/04/16 1537 01/05/16 1030       Patient was given sequential compression devices, early ambulation, and ASA 325 daily to prevent DVT.  Patient benefited maximally from hospital stay and there were no complications.    Recent vital signs: Patient Vitals for the past 24 hrs:  BP Temp Temp src Pulse Resp SpO2  01/08/16 0846 124/71 mmHg - - - - -  01/08/16 0500 128/75 mmHg 98.5 F (36.9 C) Oral 93 18 95 %  01/08/16 0111 (!) 98/56 mmHg - - 89 - -  01/07/16 2118 (!) 95/59  mmHg - - - - -  01/07/16 2050 (!) 91/57 mmHg 98.4 F (36.9 C) Oral 85 18 97 %  01/07/16 1352 113/67 mmHg 98.6 F (37 C) - 84 18 98 %     Recent laboratory studies:  Recent Labs  01/06/16 0312 01/07/16 0629 01/08/16 0729  WBC 8.2 13.6* 8.5  HGB 10.5* 10.1* 10.0*  HCT 34.1* 32.2* 31.0*  PLT 241 247 232  NA 138  --   --   K 3.3*  --   --   CL 104  --   --   CO2 26  --   --   BUN 10  --   --   CREATININE 1.31*  --   --   GLUCOSE 140*  --   --   CALCIUM 8.4*  --   --      Discharge Medications:     Medication List    STOP taking these medications        aspirin 81 MG tablet  Replaced by:  aspirin EC 325 MG tablet     HYDROcodone-acetaminophen 5-325 MG tablet  Commonly known as:  NORCO/VICODIN      TAKE these medications        amLODipine-valsartan 10-160 MG tablet  Commonly known as:  EXFORGE  Take 1 tablet by mouth daily.     aspirin  EC 325 MG tablet  Take 1 tablet (325 mg total) by mouth 2 (two) times daily.     atorvastatin 10 MG tablet  Commonly known as:  LIPITOR  Take 1 tablet (10 mg total) by mouth daily.     CALCIUM 500 PO  Take 1 tablet by mouth daily.     citalopram 10 MG tablet  Commonly known as:  CELEXA  Take 10 mg by mouth daily.     cloNIDine 0.3 MG tablet  Commonly known as:  CATAPRES  Take 1 tablet (0.3 mg total) by mouth 2 (two) times daily.     cyanocobalamin 100 MCG tablet  Take 100 mcg by mouth daily.     doxazosin 8 MG tablet  Commonly known as:  CARDURA  Take 0.5 tablets (4 mg total) by mouth at bedtime.     furosemide 20 MG tablet  Commonly known as:  LASIX  Take 20 mg by mouth 2 (two) times daily.     gabapentin 300 MG capsule  Commonly known as:  NEURONTIN  Take 1 capsule (300 mg total) by mouth 3 (three) times daily.     methocarbamol 500 MG tablet  Commonly known as:  ROBAXIN  1 tablet q 6hr prn muscle spasm     multivitamin tablet  Take 1 tablet by mouth daily.     omeprazole 20 MG capsule  Commonly known  as:  PRILOSEC  Take 20 mg by mouth 2 (two) times daily.     oxyCODONE-acetaminophen 5-325 MG tablet  Commonly known as:  ROXICET  Take 1 tablet by mouth every 4 (four) hours as needed for severe pain.     TOVIAZ 4 MG Tb24 tablet  Generic drug:  fesoterodine  Take 4 mg by mouth at bedtime.     traMADol 50 MG tablet  Commonly known as:  ULTRAM  Take 1-2 tablets by mouth 3 (three) times daily as needed.     ZETIA 10 MG tablet  Generic drug:  ezetimibe  Take 10 mg by mouth daily.        Diagnostic Studies: Dg Chest 2 View  12/25/2015  CLINICAL DATA:  Left knee osteoarthritis. Preop respiratory exam for total knee arthroplasty. Chest pain, gastroesophageal reflux disease, and hypertension. EXAM: CHEST  2 VIEW COMPARISON:  12/05/2009 FINDINGS: The heart size and mediastinal contours are within normal limits. Mild ectasia thoracic aorta again noted. Both lungs are clear. No evidence of pleural effusion or pneumothorax. The visualized skeletal structures are unremarkable. IMPRESSION: No active cardiopulmonary disease. Electronically Signed   By: Earle Gell M.D.   On: 12/25/2015 15:17    Disposition: 01-Home or Self Care      Discharge Instructions    Call MD / Call 911    Complete by:  As directed   If you experience chest pain or shortness of breath, CALL 911 and be transported to the hospital emergency room.  If you develope a fever above 101 F, pus (white drainage) or increased drainage or redness at the wound, or calf pain, call your surgeon's office.     Constipation Prevention    Complete by:  As directed   Drink plenty of fluids.  Prune juice may be helpful.  You may use a stool softener, such as Colace (over the counter) 100 mg twice a day.  Use MiraLax (over the counter) for constipation as needed.     Diet - low sodium heart healthy    Complete by:  As directed  Increase activity slowly as tolerated    Complete by:  As directed            Follow-up Information     Follow up with [facility].   Why:  They will contact you to schedule home therapy visits.    Contact information:   [address] [place] [address] [phone]        Signed: Grier Mitts 01/08/2016, 8:56 AM

## 2016-01-08 NOTE — Care Management Important Message (Signed)
Important Message  Patient Details  Name: Chelsea [name] MRN: [id number] Date of Birth: 1951-06-12   Medicare Important Message Given:  Yes    Loann Quill 01/08/2016, 8:33 AM

## 2016-01-08 NOTE — Progress Notes (Signed)
OT recommending [place], contacted Manuela Schwartz at [facility] and added HHOT. Patient set up for [place] and [place] with [facility]. Spoke with patient about discharge plan and confirmed that she does not agree to going to SNF although SNF recommended by PT. Patient stated that her ex husband will be staying with her 24/7. Informed patient that she will be receiving HHOT as well as HHPT.

## 2016-01-08 NOTE — Discharge Instructions (Signed)

## 2016-01-09 DIAGNOSIS — Z7982 Long term (current) use of aspirin: Secondary | ICD-10-CM | POA: Diagnosis not present

## 2016-01-09 DIAGNOSIS — Z96652 Presence of left artificial knee joint: Secondary | ICD-10-CM | POA: Diagnosis not present

## 2016-01-09 DIAGNOSIS — I340 Nonrheumatic mitral (valve) insufficiency: Secondary | ICD-10-CM | POA: Diagnosis not present

## 2016-01-09 DIAGNOSIS — Z471 Aftercare following joint replacement surgery: Secondary | ICD-10-CM | POA: Diagnosis not present

## 2016-01-09 DIAGNOSIS — E785 Hyperlipidemia, unspecified: Secondary | ICD-10-CM | POA: Diagnosis not present

## 2016-01-09 DIAGNOSIS — M1712 Unilateral primary osteoarthritis, left knee: Secondary | ICD-10-CM | POA: Diagnosis not present

## 2016-01-09 DIAGNOSIS — I10 Essential (primary) hypertension: Secondary | ICD-10-CM | POA: Diagnosis not present

## 2016-01-12 DIAGNOSIS — Z7982 Long term (current) use of aspirin: Secondary | ICD-10-CM | POA: Diagnosis not present

## 2016-01-12 DIAGNOSIS — Z471 Aftercare following joint replacement surgery: Secondary | ICD-10-CM | POA: Diagnosis not present

## 2016-01-12 DIAGNOSIS — Z96652 Presence of left artificial knee joint: Secondary | ICD-10-CM | POA: Diagnosis not present

## 2016-01-12 DIAGNOSIS — I340 Nonrheumatic mitral (valve) insufficiency: Secondary | ICD-10-CM | POA: Diagnosis not present

## 2016-01-12 DIAGNOSIS — I10 Essential (primary) hypertension: Secondary | ICD-10-CM | POA: Diagnosis not present

## 2016-01-12 DIAGNOSIS — E785 Hyperlipidemia, unspecified: Secondary | ICD-10-CM | POA: Diagnosis not present

## 2016-01-13 DIAGNOSIS — I340 Nonrheumatic mitral (valve) insufficiency: Secondary | ICD-10-CM | POA: Diagnosis not present

## 2016-01-13 DIAGNOSIS — Z96652 Presence of left artificial knee joint: Secondary | ICD-10-CM | POA: Diagnosis not present

## 2016-01-13 DIAGNOSIS — Z7982 Long term (current) use of aspirin: Secondary | ICD-10-CM | POA: Diagnosis not present

## 2016-01-13 DIAGNOSIS — I10 Essential (primary) hypertension: Secondary | ICD-10-CM | POA: Diagnosis not present

## 2016-01-13 DIAGNOSIS — E785 Hyperlipidemia, unspecified: Secondary | ICD-10-CM | POA: Diagnosis not present

## 2016-01-13 DIAGNOSIS — Z471 Aftercare following joint replacement surgery: Secondary | ICD-10-CM | POA: Diagnosis not present

## 2016-01-14 DIAGNOSIS — Z96652 Presence of left artificial knee joint: Secondary | ICD-10-CM | POA: Diagnosis not present

## 2016-01-14 DIAGNOSIS — E785 Hyperlipidemia, unspecified: Secondary | ICD-10-CM | POA: Diagnosis not present

## 2016-01-14 DIAGNOSIS — I340 Nonrheumatic mitral (valve) insufficiency: Secondary | ICD-10-CM | POA: Diagnosis not present

## 2016-01-14 DIAGNOSIS — Z7982 Long term (current) use of aspirin: Secondary | ICD-10-CM | POA: Diagnosis not present

## 2016-01-14 DIAGNOSIS — Z471 Aftercare following joint replacement surgery: Secondary | ICD-10-CM | POA: Diagnosis not present

## 2016-01-14 DIAGNOSIS — I10 Essential (primary) hypertension: Secondary | ICD-10-CM | POA: Diagnosis not present

## 2016-01-15 DIAGNOSIS — Z7982 Long term (current) use of aspirin: Secondary | ICD-10-CM | POA: Diagnosis not present

## 2016-01-15 DIAGNOSIS — Z471 Aftercare following joint replacement surgery: Secondary | ICD-10-CM | POA: Diagnosis not present

## 2016-01-15 DIAGNOSIS — I10 Essential (primary) hypertension: Secondary | ICD-10-CM | POA: Diagnosis not present

## 2016-01-15 DIAGNOSIS — E785 Hyperlipidemia, unspecified: Secondary | ICD-10-CM | POA: Diagnosis not present

## 2016-01-15 DIAGNOSIS — I340 Nonrheumatic mitral (valve) insufficiency: Secondary | ICD-10-CM | POA: Diagnosis not present

## 2016-01-15 DIAGNOSIS — Z96652 Presence of left artificial knee joint: Secondary | ICD-10-CM | POA: Diagnosis not present

## 2016-01-19 DIAGNOSIS — Z7982 Long term (current) use of aspirin: Secondary | ICD-10-CM | POA: Diagnosis not present

## 2016-01-19 DIAGNOSIS — E785 Hyperlipidemia, unspecified: Secondary | ICD-10-CM | POA: Diagnosis not present

## 2016-01-19 DIAGNOSIS — Z471 Aftercare following joint replacement surgery: Secondary | ICD-10-CM | POA: Diagnosis not present

## 2016-01-19 DIAGNOSIS — I10 Essential (primary) hypertension: Secondary | ICD-10-CM | POA: Diagnosis not present

## 2016-01-19 DIAGNOSIS — Z96652 Presence of left artificial knee joint: Secondary | ICD-10-CM | POA: Diagnosis not present

## 2016-01-19 DIAGNOSIS — I340 Nonrheumatic mitral (valve) insufficiency: Secondary | ICD-10-CM | POA: Diagnosis not present

## 2016-01-20 DIAGNOSIS — M1712 Unilateral primary osteoarthritis, left knee: Secondary | ICD-10-CM | POA: Diagnosis not present

## 2016-01-22 DIAGNOSIS — Z471 Aftercare following joint replacement surgery: Secondary | ICD-10-CM | POA: Diagnosis not present

## 2016-01-22 DIAGNOSIS — E785 Hyperlipidemia, unspecified: Secondary | ICD-10-CM | POA: Diagnosis not present

## 2016-01-22 DIAGNOSIS — I340 Nonrheumatic mitral (valve) insufficiency: Secondary | ICD-10-CM | POA: Diagnosis not present

## 2016-01-22 DIAGNOSIS — Z96652 Presence of left artificial knee joint: Secondary | ICD-10-CM | POA: Diagnosis not present

## 2016-01-22 DIAGNOSIS — Z7982 Long term (current) use of aspirin: Secondary | ICD-10-CM | POA: Diagnosis not present

## 2016-01-22 DIAGNOSIS — I10 Essential (primary) hypertension: Secondary | ICD-10-CM | POA: Diagnosis not present

## 2016-01-23 DIAGNOSIS — I340 Nonrheumatic mitral (valve) insufficiency: Secondary | ICD-10-CM | POA: Diagnosis not present

## 2016-01-23 DIAGNOSIS — E785 Hyperlipidemia, unspecified: Secondary | ICD-10-CM | POA: Diagnosis not present

## 2016-01-23 DIAGNOSIS — Z96652 Presence of left artificial knee joint: Secondary | ICD-10-CM | POA: Diagnosis not present

## 2016-01-23 DIAGNOSIS — I10 Essential (primary) hypertension: Secondary | ICD-10-CM | POA: Diagnosis not present

## 2016-01-23 DIAGNOSIS — Z7982 Long term (current) use of aspirin: Secondary | ICD-10-CM | POA: Diagnosis not present

## 2016-01-23 DIAGNOSIS — Z471 Aftercare following joint replacement surgery: Secondary | ICD-10-CM | POA: Diagnosis not present

## 2016-01-26 DIAGNOSIS — Z7982 Long term (current) use of aspirin: Secondary | ICD-10-CM | POA: Diagnosis not present

## 2016-01-26 DIAGNOSIS — I340 Nonrheumatic mitral (valve) insufficiency: Secondary | ICD-10-CM | POA: Diagnosis not present

## 2016-01-26 DIAGNOSIS — E785 Hyperlipidemia, unspecified: Secondary | ICD-10-CM | POA: Diagnosis not present

## 2016-01-26 DIAGNOSIS — Z471 Aftercare following joint replacement surgery: Secondary | ICD-10-CM | POA: Diagnosis not present

## 2016-01-26 DIAGNOSIS — I10 Essential (primary) hypertension: Secondary | ICD-10-CM | POA: Diagnosis not present

## 2016-01-26 DIAGNOSIS — Z96652 Presence of left artificial knee joint: Secondary | ICD-10-CM | POA: Diagnosis not present

## 2016-01-27 DIAGNOSIS — E785 Hyperlipidemia, unspecified: Secondary | ICD-10-CM | POA: Diagnosis not present

## 2016-01-27 DIAGNOSIS — Z471 Aftercare following joint replacement surgery: Secondary | ICD-10-CM | POA: Diagnosis not present

## 2016-01-27 DIAGNOSIS — I10 Essential (primary) hypertension: Secondary | ICD-10-CM | POA: Diagnosis not present

## 2016-01-27 DIAGNOSIS — Z96652 Presence of left artificial knee joint: Secondary | ICD-10-CM | POA: Diagnosis not present

## 2016-01-27 DIAGNOSIS — I340 Nonrheumatic mitral (valve) insufficiency: Secondary | ICD-10-CM | POA: Diagnosis not present

## 2016-01-27 DIAGNOSIS — Z7982 Long term (current) use of aspirin: Secondary | ICD-10-CM | POA: Diagnosis not present

## 2016-01-29 DIAGNOSIS — Z96652 Presence of left artificial knee joint: Secondary | ICD-10-CM | POA: Diagnosis not present

## 2016-01-29 DIAGNOSIS — Z7982 Long term (current) use of aspirin: Secondary | ICD-10-CM | POA: Diagnosis not present

## 2016-01-29 DIAGNOSIS — E785 Hyperlipidemia, unspecified: Secondary | ICD-10-CM | POA: Diagnosis not present

## 2016-01-29 DIAGNOSIS — Z471 Aftercare following joint replacement surgery: Secondary | ICD-10-CM | POA: Diagnosis not present

## 2016-01-29 DIAGNOSIS — I10 Essential (primary) hypertension: Secondary | ICD-10-CM | POA: Diagnosis not present

## 2016-01-29 DIAGNOSIS — I340 Nonrheumatic mitral (valve) insufficiency: Secondary | ICD-10-CM | POA: Diagnosis not present

## 2016-02-03 ENCOUNTER — Ambulatory Visit: Payer: Medicare Other | Attending: Orthopedic Surgery

## 2016-02-03 DIAGNOSIS — M25662 Stiffness of left knee, not elsewhere classified: Secondary | ICD-10-CM | POA: Diagnosis not present

## 2016-02-03 DIAGNOSIS — R29898 Other symptoms and signs involving the musculoskeletal system: Secondary | ICD-10-CM

## 2016-02-03 DIAGNOSIS — R262 Difficulty in walking, not elsewhere classified: Secondary | ICD-10-CM | POA: Diagnosis not present

## 2016-02-03 DIAGNOSIS — M6289 Other specified disorders of muscle: Secondary | ICD-10-CM | POA: Diagnosis not present

## 2016-02-03 DIAGNOSIS — R2681 Unsteadiness on feet: Secondary | ICD-10-CM | POA: Diagnosis not present

## 2016-02-03 NOTE — Therapy (Signed)
[place], [place], [address] Phone: [phone]   Fax:  [phone]  Physical Therapy Evaluation  Patient Details  Name: Chelsea [name] MRN: [id number] Date of Birth: September 11, 1951 Referring Provider: Frederik Pear, MD  Encounter Date: 02/03/2016      PT End of Session - 02/03/16 1442    Visit Number 1   Number of Visits 16   Date for PT Re-Evaluation 03/30/16   Authorization Type UHC MCR   Authorization Time Period KX at visit 15   PT Start Time 0110   PT Stop Time 0200   PT Time Calculation (min) 50 min   Activity Tolerance Patient tolerated treatment well   Behavior During Therapy [facility] for tasks assessed/performed      Past Medical History  Diagnosis Date  . Hypertension   . Hyperlipidemia   . Cerebrovascular accident [facility])     a. 2007-->residual left sided weakness  . Chest pain   . Obesity   . Peptic ulcer disease   . Hx of hysterectomy   . Mild to Moderate Aortic Insufficiency     a. 08/2014 Echo: EF 55-60%, mild conc LVH, no rwma, Gr 1 DD, mild-mod AI, mild MR.  . Morbid obesity ([place])   . Depression   . Anxiety   . Headache   . GERD (gastroesophageal reflux disease)     Past Surgical History  Procedure Laterality Date  . Tubal ligation    . Abdominal hysterectomy    . Total knee arthroplasty Left 01/05/2016    Procedure: TOTAL KNEE ARTHROPLASTY;  Surgeon: Frederik Pear, MD;  Location: [place];  Service: Orthopedics;  Laterality: Left;    There were no vitals filed for this visit.  Visit Diagnosis:  Difficulty walking - Plan: PT plan of care cert/re-cert  Stiffness of knee joint, left - Plan: PT plan of care cert/re-cert  Unsteadiness on feet - Plan: PT plan of care cert/re-cert  Weakness of both hips - Plan: PT plan of care cert/re-cert  Weakness of left lower extremity - Plan: PT plan of care cert/re-cert      Subjective Assessment - 02/03/16 1313    Subjective TKA LT  01/05/16.   She was in pain for years and she was bone on bone so had surgery.    Limitations Walking;Standing;Sitting   How long can you sit comfortably? 45-60 min then stiff and sore after   How long can you stand comfortably? 15 min   How long can you walk comfortably? 200 feet   Patient Stated Goals To be able to get around as before without device. REturn to exercise with gym machine at home   Currently in Pain? No/denies   Pain Score 4   in last week   Pain Location Knee   Pain Orientation Left   Pain Descriptors / Indicators Aching   Pain Type Surgical pain   Pain Onset More than a month ago   Pain Frequency Intermittent   Aggravating Factors  being on feet for periods   Pain Relieving Factors rest, medication, ice   Multiple Pain Sites No            [facility] PT Assessment - 02/03/16 1313    Assessment   Medical Diagnosis LT TKA   Referring Provider Frederik Pear, MD   Onset Date/Surgical Date 01/05/16   Next MD Visit 02/24/16   Prior Therapy She had OT and PT home health. Stopped 01/27/16 last seen   Precautions  Precautions None   Restrictions   Weight Bearing Restrictions No   Balance Screen   Has the patient fallen in the past 6 months No   Has the patient had a decrease in activity level because of a fear of falling?  Yes   Is the patient reluctant to leave their home because of a fear of falling?  No   Home Environment   Living Environment Private residence   Living Arrangements Alone   Type of [place] Access Level entry   Prior Function   Level of Independence Requires assistive device for independence   Cognition   Overall Cognitive Status Within Functional Limits for tasks assessed   Observation/Other Assessments-Edema    Edema Circumferential   Circumferential Edema   Circumferential - Right mid patella  48 cm.   Circumferential - Left  43 cm.   Posture/Postural Control   Posture Comments Legs fall into ER due to weakness at hips   ROM / Strength    AROM / PROM / Strength AROM;PROM;Strength   AROM   AROM Assessment Site Knee   Right/Left Knee Right;Left   Right Knee Extension 0   Right Knee Flexion 130   Left Knee Extension -15   Left Knee Flexion 108   PROM   PROM Assessment Site Knee   Right/Left Knee Left   Left Knee Extension -5   Left Knee Flexion 110   Strength   Overall Strength Comments Lt hip flex 4/5 , abduction 3-/5 ,  rotation 4/5.    Strength Assessment Site Knee   Right/Left Knee Right;Left   Right Knee Flexion 5/5   Right Knee Extension 5/5   Left Knee Flexion 4/5   Left Knee Extension 5/5   Ambulation/Gait   Assistive device Rolling walker   Gait Pattern Step-through pattern   Balance   Balance Assessed --  Unable to stand on LT leg , RT 3 sec max with light hand ass                   [facility] Adult PT Treatment/Exercise - 02/03/16 1313    Exercises   Exercises Knee/Hip   Knee/Hip Exercises: Aerobic   Nustep L4 6 min                 PT Education - 02/03/16 1337    Education provided Yes   Education Details POC   Person(s) Educated Patient   Methods Explanation   Comprehension Verbalized understanding          PT Short Term Goals - 02/03/16 1449    PT SHORT TERM GOAL #1   Title she will be independent with inital HEP issued   Time 4   Period Weeks   Status New   PT SHORT TERM GOAL #2   Title She will be able to walk with supervision with [facility] safely 200 feet for basisc in home ambulation   Time 4   Period Weeks   Status New   PT SHORT TERM GOAL #3   Title She wioo improve active LT knee flexion to 125 degrees or more    Time 4   Period Weeks   Status New           PT Long Term Goals - 02/03/16 1451    PT LONG TERM GOAL #1   Title she will walk safely in community with [facility].    Time 8   Period Weeks   Status New  PT LONG TERM GOAL #2   Title She will improve edema to 44 cm LT knee to improve active flexion to 130 degeesso she can walk comfortabley in and  out of home   Time 8   Period Weeks   Status New   PT LONG TERM GOAL #3   Title She will be able to walk 12 steps with one rail up and down safely for community access.    Time 8   Period Weeks   Status New   PT LONG TERM GOAL #4   Title She will be independent with all HEP issued as of last visit   Time 8   Period Weeks   Status New   PT LONG TERM GOAL #5   Title She will report no pain with normal home tasks   Time 8   Period Weeks   Status New               Plan - March 02, 2016 1443    Clinical Impression Statement MS Lagace presents with LE weakness limiting ambulation and need for use of walker. Some of the weakness may be residual from previous CVA. She has measureable swelling limiting ROM and decreased balance due to weakness an effects of CVA.  Pain is only a minor factor limiting activity. She should do well with PT but may be limited by effects of CVA on LT LE   Pt will benefit from skilled therapeutic intervention in order to improve on the following deficits Decreased strength;Decreased balance;Pain;Decreased activity tolerance;Difficulty walking;Decreased range of motion   Rehab Potential Good   PT Frequency 2x / week   PT Duration 8 weeks   PT Treatment/Interventions Cryotherapy;Software engineer;Therapeutic exercise;Manual techniques;Taping;Vasopneumatic Device;Dry needling;Patient/family education;Passive range of motion   PT Next Visit Plan REview HEP and add as needed and add weight. Manual to LT knee and hip strengthening   Consulted and Agree with Plan of Care Patient          G-Codes - 03/02/16 1441    Functional Assessment Tool Used FOTO 49% limited   Functional Limitation Mobility: Walking and moving around   Mobility: Walking and Moving Around Current Status [phone]) At least 40 percent but less than 60 percent impaired, limited or restricted   Mobility: Walking and Moving Around Goal Status [phone]) At least 20 percent but  less than 40 percent impaired, limited or restricted       Problem List Patient Active Problem List   Diagnosis Date Noted  . Primary osteoarthritis of left knee 01/03/2016  . Mild to Moderate Aortic Insufficiency   . Hypertension   . Hyperlipidemia   . Morbid obesity ([place])   . Pulmonary hypertension ([place]) 01/30/2013  . Orthostatic hypotension 12/18/2012  . Aortic valve disorder 07/29/2010  . CHEST PAIN-PRECORDIAL 07/29/2010  . CYST OF THYROID 01/06/2010  . HYPOKALEMIA 01/06/2010  . HYPERLIPIDEMIA 12/24/2009  . OBESITY 12/24/2009  . HYPERTENSION, UNSPECIFIED 12/24/2009  . Personal history of unspecified circulatory disease 12/24/2009  . PEPTIC ULCER DISEASE, HX OF 12/24/2009    Darrel Hoover PT 03/02/2016, 2:56 PM  [facility] [address] [place], [place], [address] Phone: [phone]   Fax:  [phone]  Name: VASHANTI RANDLETT MRN: [id number] Date of Birth: [birth year]

## 2016-02-03 NOTE — Patient Instructions (Signed)
Asked her to bring in HEP after discussing what she is doing at home

## 2016-02-05 ENCOUNTER — Ambulatory Visit: Payer: Medicare Other | Admitting: Physical Therapy

## 2016-02-10 ENCOUNTER — Ambulatory Visit: Payer: Medicare Other

## 2016-02-10 DIAGNOSIS — R29898 Other symptoms and signs involving the musculoskeletal system: Secondary | ICD-10-CM

## 2016-02-10 DIAGNOSIS — M25662 Stiffness of left knee, not elsewhere classified: Secondary | ICD-10-CM | POA: Diagnosis not present

## 2016-02-10 DIAGNOSIS — R2681 Unsteadiness on feet: Secondary | ICD-10-CM | POA: Diagnosis not present

## 2016-02-10 DIAGNOSIS — R262 Difficulty in walking, not elsewhere classified: Secondary | ICD-10-CM

## 2016-02-10 DIAGNOSIS — M6289 Other specified disorders of muscle: Secondary | ICD-10-CM | POA: Diagnosis not present

## 2016-02-10 NOTE — Therapy (Signed)
[place], [place], [address] Phone: [phone]   Fax:  [phone]  Physical Therapy Treatment  Patient Details  Name: Chelsea [name] MRN: [id number] Date of Birth: June 16, 1951 Referring Provider: Frederik Pear, MD  Encounter Date: 02/10/2016      PT End of Session - 02/10/16 1503    Visit Number 2   Number of Visits 16   Date for PT Re-Evaluation 03/30/16   PT Start Time 0215   PT Stop Time 0315   PT Time Calculation (min) 60 min   Activity Tolerance Patient tolerated treatment well   Behavior During Therapy [facility] for tasks assessed/performed      Past Medical History  Diagnosis Date  . Hypertension   . Hyperlipidemia   . Cerebrovascular accident [facility])     a. 2007-->residual left sided weakness  . Chest pain   . Obesity   . Peptic ulcer disease   . Hx of hysterectomy   . Mild to Moderate Aortic Insufficiency     a. 08/2014 Echo: EF 55-60%, mild conc LVH, no rwma, Gr 1 DD, mild-mod AI, mild MR.  . Morbid obesity ([place])   . Depression   . Anxiety   . Headache   . GERD (gastroesophageal reflux disease)     Past Surgical History  Procedure Laterality Date  . Tubal ligation    . Abdominal hysterectomy    . Total knee arthroplasty Left 01/05/2016    Procedure: TOTAL KNEE ARTHROPLASTY;  Surgeon: Frederik Pear, MD;  Location: [place];  Service: Orthopedics;  Laterality: Left;    There were no vitals filed for this visit.  Visit Diagnosis:  Difficulty walking  Stiffness of knee joint, left  Unsteadiness on feet  Weakness of both hips  Weakness of left lower extremity      Subjective Assessment - 02/10/16 1448    Subjective Want to walk off walker . Called MD to  ask and they said we would ween off walker.  BERG test done .     Patient is accompained by: Family member   Currently in Pain? No/denies  soreness in LT thigh            [facility] PT Assessment - 02/10/16 1429    AROM   Left Knee  Flexion 110   Standardized Balance Assessment   Standardized Balance Assessment Berg Balance Test   Berg Balance Test   Sit to Stand Able to stand  independently using hands   Standing Unsupported Able to stand safely 2 minutes   Sitting with Back Unsupported but Feet Supported on Floor or Stool Able to sit safely and securely 2 minutes   Stand to Sit Sits safely with minimal use of hands   Transfers Able to transfer safely, definite need of hands   Standing Unsupported with Eyes Closed Able to stand 10 seconds safely   Standing Ubsupported with Feet Together Needs help to attain position but able to stand for 30 seconds with feet together   From Standing, Reach Forward with Outstretched Arm Can reach confidently >25 cm (10")   From Standing Position, Pick up Object from Floor Able to pick up shoe safely and easily   From Standing Position, Turn to Look Behind Over each Shoulder Looks behind one side only/other side shows less weight shift   Turn 360 Degrees Able to turn 360 degrees safely but slowly   Standing Unsupported, Alternately Place Feet on Step/Stool Needs assistance to keep from falling  or unable to try   Standing Unsupported, One Foot in [place] to take small step independently and hold 30 seconds   Standing on One Leg Unable to try or needs assist to prevent fall   Total Score 38   Berg comment: The score and fall risk without use of walker was explained to patient and she appeared to understand.                     [facility] Adult PT Treatment/Exercise - 02/10/16 1429    Ambulation/Gait   Ambulation Distance (Feet) 250 Feet   Assistive device Straight cane   Gait Pattern Step-through pattern   Ambulation Surface Level;Indoor   Door Management 6: Modified independent (Device/Increase time)   Gait Comments CGA   Knee/Hip Exercises: Supine   Short Arc Quad Sets Strengthening;Left   Short Arc Quad Sets Limitations 3 pounds x12 10 sec hold   Heel Slides  Left;AROM;5 reps   Bridges Limitations 2x10 reps with cues to not adduct knees (LT leg shaky with this)   Bridges with Clamshell Strengthening;Both;2 sets  12 reps greens band   Knee/Hip Exercises: Sidelying   Hip ABduction Left;AROM  12 reps. Cued tactile and verbal to keep hip forward    Hip ABduction Limitations and to limit ROM to minimize ER /flex of LT hip   Modalities   Modalities Moist Heat   Moist Heat Therapy   Number Minutes Moist Heat 15 Minutes   Moist Heat Location --  LT thigh                PT Education - 02/10/16 1503    Education provided Yes   Education Details BERG score results and for safty a walker will be best used out of home   Person(s) Educated Patient   Methods Explanation   Comprehension Verbalized understanding          PT Short Term Goals - 02/03/16 1449    PT SHORT TERM GOAL #1   Title she will be independent with inital HEP issued   Time 4   Period Weeks   Status New   PT SHORT TERM GOAL #2   Title She will be able to walk with supervision with [facility] safely 200 feet for basisc in home ambulation   Time 4   Period Weeks   Status New   PT SHORT TERM GOAL #3   Title She wioo improve active LT knee flexion to 125 degrees or more    Time 4   Period Weeks   Status New           PT Long Term Goals - 02/03/16 1451    PT LONG TERM GOAL #1   Title she will walk safely in community with [place].    Time 8   Period Weeks   Status New   PT LONG TERM GOAL #2   Title She will improve edema to 44 cm LT knee to improve active flexion to 130 degeesso she can walk comfortabley in and out of home   Time 8   Period Weeks   Status New   PT LONG TERM GOAL #3   Title She will be able to walk 12 steps with one rail up and down safely for community access.    Time 8   Period Weeks   Status New   PT LONG TERM GOAL #4   Title She will be independent with all HEP issued as of  last visit   Time 8   Period Weeks   Status New   PT LONG TERM  GOAL #5   Title She will report no pain with normal home tasks   Time 8   Period Weeks   Status New               Plan - 02/10/16 1504    Clinical Impression Statement She is not safe for use of walker out of home even if she feels she is safe in the home. Suggested continued use of RW per BERG score.  will conitnue to work on flexion ROM and LT LE strength and balance though residual imparments from CVA will impact end function   PT Next Visit Plan Continue strength and ROM flexion. REview HEP   Consulted and Agree with Plan of Care Patient        Problem List Patient Active Problem List   Diagnosis Date Noted  . Primary osteoarthritis of left knee 01/03/2016  . Mild to Moderate Aortic Insufficiency   . Hypertension   . Hyperlipidemia   . Morbid obesity ([place])   . Pulmonary hypertension ([place]) 01/30/2013  . Orthostatic hypotension 12/18/2012  . Aortic valve disorder 07/29/2010  . CHEST PAIN-PRECORDIAL 07/29/2010  . CYST OF THYROID 01/06/2010  . HYPOKALEMIA 01/06/2010  . HYPERLIPIDEMIA 12/24/2009  . OBESITY 12/24/2009  . HYPERTENSION, UNSPECIFIED 12/24/2009  . Personal history of unspecified circulatory disease 12/24/2009  . PEPTIC ULCER DISEASE, HX OF 12/24/2009    Darrel Hoover PT 02/10/2016, 3:18 PM  [facility] [address] [place], [place], [address] Phone: [phone]   Fax:  [phone]  Name: Chelsea [name] MRN: [id number] Date of Birth: [birth year]

## 2016-02-11 ENCOUNTER — Ambulatory Visit (INDEPENDENT_AMBULATORY_CARE_PROVIDER_SITE_OTHER): Payer: Medicare Other | Admitting: Neurology

## 2016-02-11 ENCOUNTER — Encounter: Payer: Self-pay | Admitting: Neurology

## 2016-02-11 VITALS — BP 130/68 | HR 78 | Resp 18 | Ht 66.0 in | Wt 204.0 lb

## 2016-02-11 DIAGNOSIS — R259 Unspecified abnormal involuntary movements: Secondary | ICD-10-CM | POA: Diagnosis not present

## 2016-02-11 DIAGNOSIS — I619 Nontraumatic intracerebral hemorrhage, unspecified: Secondary | ICD-10-CM | POA: Diagnosis not present

## 2016-02-11 DIAGNOSIS — G8194 Hemiplegia, unspecified affecting left nondominant side: Secondary | ICD-10-CM | POA: Diagnosis not present

## 2016-02-11 DIAGNOSIS — G890 Central pain syndrome: Secondary | ICD-10-CM | POA: Diagnosis not present

## 2016-02-11 DIAGNOSIS — I499 Cardiac arrhythmia, unspecified: Secondary | ICD-10-CM

## 2016-02-11 MED ORDER — GABAPENTIN 300 MG PO CAPS: 300.0000 mg | ORAL_CAPSULE | Freq: Three times a day (TID) | ORAL | Status: DC

## 2016-02-11 NOTE — Progress Notes (Signed)
Subjective:    [name] ID: Chelsea [name] is a 65 y.o. [name].  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 Chelsea [name] is unaccompanied today. I last saw Chelsea [name] on 08/14/2015, at which time Chelsea [name] reported doing fairly well. Chelsea [name] requested a letter so Chelsea [name] can have help with Chelsea [name] trash can as Chelsea [name] was not able to move Chelsea [name] trash can by herself. Chelsea [name] felt that Chelsea increase in gabapentin was helpful. Chelsea [name] was not driving. Chelsea [name] had ongoing left-sided weakness and left-sided involuntary movements but stable. Chelsea [name] had gained some weight and was scheduled to see a nutritionist. I suggested Chelsea [name] continue with gabapentin 300 mg 3 times a day.  Today, 02/11/2016: Chelsea [name] reports that Chelsea gabapentin is working fairly well. Chelsea [name] still has residual pain on Chelsea left side. I noticed an irregular heartbeat and pulse today. Chelsea [name] has a mild systolic murmur. Chelsea [name] denies any chest pain or shortness of breath but does say that Chelsea [name] notices an irregular heartbeat from time to time. Sometimes Chelsea [name] takes an extra baby aspirin and it helps. Chelsea [name] has not seen cardiology from what I can see since 2014. Chelsea [name] had an echocardiogram and EKG for surgical clearance last month. I reviewed Chelsea echocardiogram results. In Chelsea interim, on 01/05/2016 Chelsea [name] underwent left total knee arthroplasty under Dr. Mayer Camel. Chelsea [name] is coming along fairly well after that. Chelsea [name] is sore in Chelsea [name] left knee. Chelsea [name] is in physical therapy. Unfortunately, Chelsea [name] had side effects with Chelsea [name] pain medication, including severe nausea and vomiting. Chelsea [name] stopped taking Chelsea [name] narcotic pain medication in late February. Chelsea [name] is on nausea medication as needed. Well after that.    Previously:  I saw Chelsea [name] on 03/10/2015, at which time Chelsea [name] reported that Chelsea gabapentin was somewhat helpful. Chelsea [name] had some residual pain. Chelsea [name] was taking 300 mg each night. We gradually increased it to tid.   I first met Chelsea [name] on 11/06/2014 at Chelsea request of Chelsea [name] primary care physician, at which time Chelsea [name] reported a several year history of left-sided pain in Chelsea [name] face as well as hemibody. Symptoms started perhaps a few years after Chelsea [name] stroke in 2007. I suggested symptomatic treatment with gabapentin. 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 I suggested Chelsea [name] titrate gabapentin starting at 100 mg at night.  Chelsea [name] reports left-sided pain in Chelsea [name] face, as well as Chelsea left hemibody. Symptoms started gradually, but Chelsea [name] does not know exactly when they started. It did not happen immediately after Chelsea stroke in 2007, perhaps a few years later. Chelsea [name] has residual left-sided weakness and walks with a cane. Chelsea [name] had therapy and home health therapy and was advised to use a cane. Chelsea [name] describes a warmth sensation on Chelsea left side of Chelsea [name] face and hemibody. This can be a burning pain as well at times. It is not always Chelsea same or constant. Chelsea [name] has a tremor on Chelsea left side and difficulty with fine motor control in Chelsea left. In addition Chelsea [name] has neck and lower back pain. Chelsea [name] has seen a chiropractor for this. Chelsea [name] had seen [place] for this and Chelsea [name] knee in Chelsea past. Chelsea [name]'s not able to pinpoint Chelsea degree of pain  on Chelsea left side. Sometimes it is not actually a pain but an abnormal temperature sensation. It is not necessarily debilitating but at times quite painful when it has Chelsea burning sensation to it. Chelsea [name] lives alone. Chelsea [name] has 3 grown children. Chelsea [name] grandson is currently visiting. Chelsea [name] does not smoke. Chelsea [name] is on a baby aspirin. 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 Chelsea [name] had an MRI brain with and without  contrast on 11/20/2008: 1.  No evidence of acute ischemia. 2.  Stable non specific supratentorial subcortical white matter changes most likely representing areas of ischemic gliosis due to small vessel disease related to hypertension or diabetes. 3.  Interval near-complete resolution of Chelsea previous right thalamic hemorrhage. 4.  Small focal area of enhancement within Chelsea epicenter of Chelsea hemorrhage, which probably represents contrast pooling.  No evidence of a vascular abnormality however is suggested on Chelsea MRI examination.   Chelsea [name] had a brain MRI with and without contrast and brain MRA without contrast on 04/22/2006:1.  Right thalamic hematoma with surrounding mass effect secondary to vasogenic edema. 2.  Mild mass effect on Chelsea inferior aspect of Chelsea third ventricle. 3.  No imaging evidence of abnormal enhancement or blood vessel ending from periphery of this hemorrhage.   4.  Mild sinusitis changes.   1.  No evidence of occlusion, stenosis, dissections or brain aneurysms noted on Chelsea images provided. 2.  Aneurysms of 5 mm or less may not be seen on MRA examination.    I personally reviewed Chelsea images through Chelsea PACS system and also shared images with Chelsea [name] on Chelsea computer.  Of note, Chelsea [name] is not sure if Chelsea [name] snores. Chelsea [name] denies gasping sensations and has not been told that Chelsea [name] has pauses in Chelsea [name] breathing.  Chelsea [name] Past Medical History Is Significant For: Past Medical History  Diagnosis Date  . Hypertension   . Hyperlipidemia   . Cerebrovascular accident [facility])     a. 2007-->residual left sided weakness  . Chest pain   . Obesity   . Peptic ulcer disease   . Hx of hysterectomy   . Mild to Moderate Aortic Insufficiency     a. 08/2014 Echo: EF 55-60%, mild conc LVH, no rwma, Gr 1 DD, mild-mod AI, mild MR.  . Morbid obesity ([place])   . Depression   . Anxiety   . Headache   . GERD (gastroesophageal reflux disease)     Chelsea [name] Past Surgical History Is Significant For: Past Surgical History  Procedure  Laterality Date  . Tubal ligation    . Abdominal hysterectomy    . Total knee arthroplasty Left 01/05/2016    Procedure: TOTAL KNEE ARTHROPLASTY;  Surgeon: Frederik Pear, MD;  Location: [facility];  Service: Orthopedics;  Laterality: Left;    Chelsea [name] Family History Is Significant For: Family History  Problem Relation Age of Onset  . Hypertension Mother     deceased  . Peripheral vascular disease Mother   . Cirrhosis Father     DICEASED  . Heart attack Other     grandmother deceased    Chelsea [name] Social History Is Significant For: Social History   Social History  . Marital Status: Married    Spouse Name: N/A  . Number of Children: 3  . 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 Smoking status: Never Smoker   . Smokeless tobacco:  Never Used  . Alcohol Use: No  . Drug Use: No  . Sexual Activity: Not Currently   Other Topics Concern  . None   Social History Narrative   [name] consumes no caffeine    Chelsea [name] Allergies Are:  No Known Allergies:   Chelsea [name] Current Medications Are:  Outpatient Encounter Prescriptions as of 02/11/2016  Medication Sig  . amLODipine-valsartan (EXFORGE) 10-160 MG per tablet Take 1 tablet by mouth daily.  Marland Kitchen aspirin EC 325 MG tablet Take 1 tablet (325 mg total) by mouth 2 (two) times daily.  Marland Kitchen atorvastatin (LIPITOR) 10 MG tablet Take 1 tablet (10 mg total) by mouth daily.  . Calcium-Magnesium-Vitamin D (CALCIUM 500 PO) Take 1 tablet by mouth daily.   . citalopram (CELEXA) 10 MG tablet Take 10 mg by mouth daily.  . cloNIDine (CATAPRES) 0.3 MG tablet Take 1 tablet (0.3 mg total) by mouth 2 (two) times daily.  . cyanocobalamin 100 MCG tablet Take 100 mcg by mouth daily.   Marland Kitchen doxazosin (CARDURA) 8 MG tablet Take 0.5 tablets (4 mg total) by mouth at bedtime. ([name] taking differently: Take 8 mg by mouth at bedtime. )  . ezetimibe (ZETIA) 10 MG tablet Take 10 mg  by mouth daily.  . fesoterodine (TOVIAZ) 4 MG TB24 tablet Take 4 mg by mouth at bedtime.  . furosemide (LASIX) 20 MG tablet Take 20 mg by mouth 2 (two) times daily.   Marland Kitchen gabapentin (NEURONTIN) 300 MG capsule Take 1 capsule (300 mg total) by mouth 3 (three) times daily.  . methocarbamol (ROBAXIN) 500 MG tablet 1 tablet q 6hr prn muscle spasm  . Multiple Vitamin (MULTIVITAMIN) tablet Take 1 tablet by mouth daily.    Marland Kitchen omeprazole (PRILOSEC) 20 MG capsule Take 20 mg by mouth 2 (two) times daily.    Marland Kitchen oxyCODONE-acetaminophen (ROXICET) 5-325 MG tablet Take 1 tablet by mouth every 4 (four) hours as needed for severe pain.  . traMADol (ULTRAM) 50 MG tablet Take 1-2 tablets by mouth 3 (three) times daily as needed.  . [DISCONTINUED] gabapentin (NEURONTIN) 300 MG capsule Take 1 capsule (300 mg total) by mouth 3 (three) times daily.   No facility-administered encounter medications on file as of 02/11/2016.  :  Review of Systems:  Out of a complete 14 point review of systems, all are reviewed and negative with Chelsea exception of these symptoms as listed below:  Review of Systems  Neurological:       [name] recently had L knee surgery. No new concerns.     Objective:  Neurologic Exam  Physical Exam Physical Examination:   Filed Vitals:   02/11/16 1502  BP: 130/68  Pulse: 78  Resp: 18   General Examination: Chelsea [name] is a very pleasant 65 y.o. [name] in no acute distress. Chelsea [name] appears well-developed and well-nourished and well groomed. Chelsea [name] wears a head scarf. Chelsea [name] is in good spirits today. Chelsea [name] denies any palpitations, chest pain or shortness of breath.   HEENT: Normocephalic, atraumatic, pupils are equal, round and reactive to light and accommodation. Funduscopic exam is normal with sharp disc margins noted. Chelsea [name] has mild b/l cataracts. Chelsea [name] wears corrective eyeglasses. Extraocular tracking is good without limitation to gaze excursion or nystagmus noted. Normal smooth pursuit is noted. Hearing is  grossly intact. Face is asymmetric with mild left lower facial weakness noted, unchanged. Speech is clear. Chelsea [name] has no significant dysarthria. Chelsea [name] has decreased sensation to all modalities on Chelsea left face. Oropharynx exam reveals: moderate mouth dryness, adequate dental  hygiene and mild airway crowding.   Chest: Clear to auscultation without wheezing, rhonchi or crackles noted.  Heart: S1+S2+0, irregularly irregular, mild systolic heart murmur noted, but new.   Abdomen: Soft, non-tender and non-distended with normal bowel sounds appreciated on auscultation.  Extremities: There is trace pitting edema in Chelsea distal lower extremities bilaterally. Pedal pulses are intact.  Skin: Warm and dry without trophic changes noted. There are no varicose veins.  Musculoskeletal: exam reveals no obvious joint deformities, tenderness or joint swelling or erythema, other than unremarkable scar over Chelsea left knee, mild swelling of Chelsea left knee and mild warmth noted in Chelsea lateral aspect of Chelsea left knee. Chelsea [name] has some soreness in Chelsea [name] left knee.  Neurologically:  Mental status: Chelsea [name] is awake, alert and oriented in all 4 spheres. Chelsea [name] immediate and remote memory, attention, language skills and fund of knowledge are appropriate. There is no evidence of aphasia, agnosia, apraxia or anomia. Speech is clear with normal prosody and enunciation. Thought process is linear. Mood is normal and affect is normal.  Cranial nerves II - XII are as described above under HEENT exam. In addition: shoulder shrug is normal with equal shoulder height noted. Motor exam: Chelsea [name] has an overall fairly thin bulk. Strength is normal on Chelsea right. Chelsea [name] has 4 out of 5 weakness on Chelsea left, stable. Chelsea [name] has no involuntary movements at rest in Chelsea left upper extremity. Chelsea [name] does have a coarse intention tremor on Chelsea left, no significant postural tremor in either upper extremity. Chelsea [name] has difficulty with fine motor skills and has involuntary movements  in Chelsea left upper extremity in particular with action. Sensory exam shows decreased all modalities on Chelsea left hemibody. Reflexes are mildly hyperactive on Chelsea left and normal on Chelsea right. Chelsea [name] has mild difficulty standing and walks with a slight limp on Chelsea left.  Chelsea [name] also reports soreness in Chelsea [name] left knee when standing and walking. Tandem walk is not possible. Romberg is not possible. Chelsea [name] walks with a 2 wheeled walker.  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 Aundra Dubin in cardiology), who  presents for follow-up consultation of Chelsea [name] thalamic pain syndrome, in Chelsea context of history of thalamic stroke with residual weakness and pain symptoms of left-sided pain, including Chelsea [name] face, and left hemibody. Symptomatic treatment with gabapentin 300 mg 3 times a day has been fairly successful. Chelsea [name] exam is stable with Chelsea exception that I detected a regular heartbeat today, mild systolic murmur but Chelsea [name] heartbeat is irregularly irregular, so is Chelsea [name] pulse, concern for A. fib. Chelsea [name] may have paroxysmal A. fib for chronic A. fib. Chelsea [name] is strongly encouraged to call Chelsea [name] cardiologist today to make an appointment ASAP. Chelsea [name] is on baby aspirin and is advised to continue with this. Should Chelsea [name] have any sudden neurological symptoms Chelsea [name] knows to go to Chelsea emergency room. Chelsea [name] is advised to continue with gabapentin 300 mg 3 times a day and I renewed Chelsea [name] prescription for 90 days with refills. I advised Chelsea [name] that should Chelsea [name] need any referral to cardiology because it has been too long since Chelsea [name] was actually seen in Chelsea office, I will make a referral or Chelsea [name] can ask Chelsea [name] primary care physician for a referral. I would like to see Chelsea [name]  back in 6 months, sooner if Chelsea need  arises.  I answered all Chelsea [name] questions today and Chelsea [name] was in agreement.  I spent 25 minutes in total face-to-face time with Chelsea [name], more than 50% of which was spent in counseling and coordination of care, reviewing test results, reviewing medication and discussing or reviewing Chelsea diagnosis of thalamic stroke and residual weakness secondary to thalamic stroke and thalamic pain syndrome, as well as a fib, its prognosis and treatment options.

## 2016-02-11 NOTE — Patient Instructions (Addendum)
We will continue with your gabapentin 300 mg 3 times a day.  I have refilled your prescription.  I have detected an irregular heartbeat, which appears to be new to me. Please call your cardiologist for an appointment ASAP.

## 2016-02-16 ENCOUNTER — Ambulatory Visit: Payer: Medicare Other | Admitting: Physical Therapy

## 2016-02-16 DIAGNOSIS — R2681 Unsteadiness on feet: Secondary | ICD-10-CM

## 2016-02-16 DIAGNOSIS — M25662 Stiffness of left knee, not elsewhere classified: Secondary | ICD-10-CM | POA: Diagnosis not present

## 2016-02-16 DIAGNOSIS — R29898 Other symptoms and signs involving the musculoskeletal system: Secondary | ICD-10-CM | POA: Diagnosis not present

## 2016-02-16 DIAGNOSIS — R262 Difficulty in walking, not elsewhere classified: Secondary | ICD-10-CM | POA: Diagnosis not present

## 2016-02-16 DIAGNOSIS — M6289 Other specified disorders of muscle: Secondary | ICD-10-CM | POA: Diagnosis not present

## 2016-02-16 NOTE — Patient Instructions (Addendum)
Straight Leg Raise    Press back of knee flat into mat table. Hold knee straight and lift  _6___ inches from floor. Hold 5 seconds. Do not let it bend. Repeat __10__ times per set. Do ___2_ sets per session. Do __2__ sessions per day.  Balance: Unilateral   HOLD ONTO COUNTER TOP Attempt to balance on left leg, eyes open. Hold __60__ seconds. Repeat __1__ times per set. Do __1-2__ sets per session. Do ___2_ sessions per day.   Hamstring Stretch, Seated (Strap, Two Chairs)    Sit with one leg extended onto facing chair. Loop strap over outstretched foot at ball of big toe. Lengthen spine. Hold for _5___ breaths. Repeat __3__ times each leg.

## 2016-02-16 NOTE — Therapy (Signed)
[place], [place], [address] Phone: [phone]   Fax:  [phone]  Physical Therapy Treatment  Patient Details  Name: Chelsea [name] MRN: [id number] Date of Birth: Feb 12, 1951 Referring Provider: Frederik Pear, MD  Encounter Date: 02/16/2016      PT End of Session - 02/16/16 1450    Visit Number 3   Number of Visits 16   Date for PT Re-Evaluation 03/30/16   Authorization Type [employer] MCR   Authorization Time Period KX at visit 15   PT Start Time 0205   PT Stop Time 0255   PT Time Calculation (min) 50 min      Past Medical History  Diagnosis Date  . Hypertension   . Hyperlipidemia   . Cerebrovascular accident [facility])     a. 2007-->residual left sided weakness  . Chest pain   . Obesity   . Peptic ulcer disease   . Hx of hysterectomy   . Mild to Moderate Aortic Insufficiency     a. 08/2014 Echo: EF 55-60%, mild conc LVH, no rwma, Gr 1 DD, mild-mod AI, mild MR.  . Morbid obesity ([place])   . Depression   . Anxiety   . Headache   . GERD (gastroesophageal reflux disease)     Past Surgical History  Procedure Laterality Date  . Tubal ligation    . Abdominal hysterectomy    . Total knee arthroplasty Left 01/05/2016    Procedure: TOTAL KNEE ARTHROPLASTY;  Surgeon: Frederik Pear, MD;  Location: [facility];  Service: Orthopedics;  Laterality: Left;    There were no vitals filed for this visit.  Visit Diagnosis:  Difficulty walking  Stiffness of knee joint, left  Unsteadiness on feet  Weakness of both hips  Weakness of left lower extremity      Subjective Assessment - 02/16/16 1510    Subjective I am ready to be off this walker. My knee feels like it buckles sometimes.   Currently in Pain? No/denies                         [facility] Adult PT Treatment/Exercise - 02/16/16 0001    Knee/Hip Exercises: Aerobic   Recumbent Bike Rec bike L1 x 5 min   Knee/Hip Exercises: Standing   Knee Flexion  Limitations painful popping medial knee   SLS with 1 UE support 15 sec best    Knee/Hip Exercises: Seated   Long Arc Quad 2 sets;10 reps   Long Arc Quad Weight 5 lbs.   Hamstring Curl 10 reps   Hamstring Limitations green band - pain ful popping left medial knee   Knee/Hip Exercises: Supine   Quad Sets 10 reps   Heel Slides Left;AROM;5 reps   Bridges Limitations 2x10 reps with cues to not adduct knees (LT leg shaky with this)   Bridges with Clamshell Strengthening;Both;2 sets  12 reps greens band   Straight Leg Raises 10 reps   Straight Leg Raises Limitations with quad set and cues to decrease 5 degree lag                PT Education - 02/16/16 1509    Education provided Yes   Education Details SLS at counter, hamstring/calf stretch on stool, SLR with quad set   Person(s) Educated Patient   Methods Explanation;Handout   Comprehension Verbalized understanding          PT Short Term Goals - 02/16/16 1510  PT SHORT TERM GOAL #1   Title she will be independent with inital HEP issued   Time 4   Period Weeks   Status On-going   PT SHORT TERM GOAL #2   Title She will be able to walk with supervision with [facility] safely 200 feet for basisc in home ambulation   Time 4   Period Weeks   Status On-going   PT SHORT TERM GOAL #3   Title She wioo improve active LT knee flexion to 125 degrees or more    Time 4   Period Weeks   Status On-going           PT Long Term Goals - 02/03/16 1451    PT LONG TERM GOAL #1   Title she will walk safely in community with [place].    Time 8   Period Weeks   Status New   PT LONG TERM GOAL #2   Title She will improve edema to 44 cm LT knee to improve active flexion to 130 degeesso she can walk comfortabley in and out of home   Time 8   Period Weeks   Status New   PT LONG TERM GOAL #3   Title She will be able to walk 12 steps with one rail up and down safely for community access.    Time 8   Period Weeks   Status New   PT LONG TERM  GOAL #4   Title She will be independent with all HEP issued as of last visit   Time 8   Period Weeks   Status New   PT LONG TERM GOAL #5   Title She will report no pain with normal home tasks   Time 8   Period Weeks   Status New               Plan - 02/16/16 1540    Clinical Impression Statement Instructed pt in SLS at RW vs counter using UE support to promote weight bearing. Pt able to tolerate 15 seconds with UE support. Pt demonstrates painful popping medial knee with any resisted knee flexion. Instructed pt in seated calf/hamstring stretch using stool and towel around ball of foot. Pt reports she holds her leg perpindicular while in supine and holds it for 1 minute. Instructed pt to begin SLR with quad set and hold only slightly off table. This is very difficult and she demonstrates mild quad lag. No increased pain. Updated HEP to include todays exercises.    PT Next Visit Plan Continue strength and ROM flexion. REview HEP        Problem List Patient Active Problem List   Diagnosis Date Noted  . Primary osteoarthritis of left knee 01/03/2016  . Mild to Moderate Aortic Insufficiency   . Hypertension   . Hyperlipidemia   . Morbid obesity ([place])   . Pulmonary hypertension ([place]) 01/30/2013  . Orthostatic hypotension 12/18/2012  . Aortic valve disorder 07/29/2010  . CHEST PAIN-PRECORDIAL 07/29/2010  . CYST OF THYROID 01/06/2010  . HYPOKALEMIA 01/06/2010  . HYPERLIPIDEMIA 12/24/2009  . OBESITY 12/24/2009  . HYPERTENSION, UNSPECIFIED 12/24/2009  . Personal history of unspecified circulatory disease 12/24/2009  . PEPTIC ULCER DISEASE, HX OF 12/24/2009    Chelsea [name] , PTA  02/16/2016, 4:58 PM  [place], [place], [address] Phone: [phone]   Fax:  [phone]  Name: Chelsea [name] MRN: [id number] Date of Birth: December 10, 1950

## 2016-02-18 ENCOUNTER — Ambulatory Visit: Payer: Medicare Other | Admitting: Physical Therapy

## 2016-02-18 DIAGNOSIS — R2681 Unsteadiness on feet: Secondary | ICD-10-CM

## 2016-02-18 DIAGNOSIS — M25662 Stiffness of left knee, not elsewhere classified: Secondary | ICD-10-CM | POA: Diagnosis not present

## 2016-02-18 DIAGNOSIS — R29898 Other symptoms and signs involving the musculoskeletal system: Secondary | ICD-10-CM

## 2016-02-18 DIAGNOSIS — R262 Difficulty in walking, not elsewhere classified: Secondary | ICD-10-CM

## 2016-02-18 DIAGNOSIS — M6289 Other specified disorders of muscle: Secondary | ICD-10-CM | POA: Diagnosis not present

## 2016-02-18 NOTE — Patient Instructions (Signed)
KNEE: Flexion - Prone    Bend knee. Raise heel toward buttocks. Do not raise hips. _10 reps per set, _3__ sets per session, 2 sessions per day.  Heel Slide    Bend knee and pull heel toward buttocks. Hold __5__ seconds. Return. Repeat with other knee. Repeat __10__ times. Do _3___ sets, 2 sessions per day.

## 2016-02-18 NOTE — Therapy (Signed)
[place], [place], [address] Phone: [phone]   Fax:  [phone]  Physical Therapy Treatment  Patient Details  Name: Chelsea [name] MRN: [id number] Date of Birth: 1951/02/05 Referring Provider: Frederik Pear, MD  Encounter Date: 02/18/2016      PT End of Session - 02/18/16 1411    Visit Number 4   Number of Visits 16   Date for PT Re-Evaluation 03/30/16   Authorization Type UHC MCR   Authorization Time Period KX at visit 15   PT Start Time 0205   PT Stop Time 0300   PT Time Calculation (min) 55 min      Past Medical History  Diagnosis Date  . Hypertension   . Hyperlipidemia   . Cerebrovascular accident [facility])     a. 2007-->residual left sided weakness  . Chest pain   . Obesity   . Peptic ulcer disease   . Hx of hysterectomy   . Mild to Moderate Aortic Insufficiency     a. 08/2014 Echo: EF 55-60%, mild conc LVH, no rwma, Gr 1 DD, mild-mod AI, mild MR.  . Morbid obesity ([place])   . Depression   . Anxiety   . Headache   . GERD (gastroesophageal reflux disease)     Past Surgical History  Procedure Laterality Date  . Tubal ligation    . Abdominal hysterectomy    . Total knee arthroplasty Left 01/05/2016    Procedure: TOTAL KNEE ARTHROPLASTY;  Surgeon: Frederik Pear, MD;  Location: [place];  Service: Orthopedics;  Laterality: Left;    There were no vitals filed for this visit.  Visit Diagnosis:  Difficulty walking  Stiffness of knee joint, left  Unsteadiness on feet  Weakness of both hips  Weakness of left lower extremity      Subjective Assessment - 02/18/16 1409    Subjective I am going to start walking an the level sidewalk outside of my house with my walker.   Currently in Pain? Yes   Pain Score 1    Pain Location Knee   Pain Orientation Left   Aggravating Factors  standing    Pain Relieving Factors rest, meds, ice                         [facility] Adult PT  Treatment/Exercise - 02/18/16 0001    Knee/Hip Exercises: Aerobic   Nustep L5 x 5 minutes   Knee/Hip Exercises: Standing   Knee Flexion 1 set;10 reps   Knee Flexion Limitations no pain today   Other Standing Knee Exercises 3 way hip in parallel bars x 10 with Left SLS, squats 2 x 10 in bars    Knee/Hip Exercises: Seated   Long Arc Quad 2 sets;10 reps   Long Arc Quad Weight 5 lbs.   Knee/Hip Exercises: Supine   Quad Sets 2 sets;10 reps   Short Arc Quad Sets 3 sets;10 reps   Short Arc Quad Sets Limitations assist for full ROM, cues for hold   Heel Slides 3 sets;10 reps   Straight Leg Raises 10 reps;3 sets   Knee/Hip Exercises: Sidelying   Hip ABduction 2 sets;10 reps   Clams 2 sets 10 reps left   Other Sidelying Knee/Hip Exercises reverse clam 2 x 10 left   Knee/Hip Exercises: Prone   Hamstring Curl 3 sets;10 reps                PT Education - 02/18/16  1445    Education provided Yes   Education Details heel slide, prone h/s curl   Person(s) Educated Patient   Methods Explanation;Handout   Comprehension Verbalized understanding          PT Short Term Goals - 02/16/16 1510    PT SHORT TERM GOAL #1   Title she will be independent with inital HEP issued   Time 4   Period Weeks   Status On-going   PT SHORT TERM GOAL #2   Title She will be able to walk with supervision with [facility] safely 200 feet for basisc in home ambulation   Time 4   Period Weeks   Status On-going   PT SHORT TERM GOAL #3   Title She wioo improve active LT knee flexion to 125 degrees or more    Time 4   Period Weeks   Status On-going           PT Long Term Goals - 02/03/16 1451    PT LONG TERM GOAL #1   Title she will walk safely in community with [place].    Time 8   Period Weeks   Status New   PT LONG TERM GOAL #2   Title She will improve edema to 44 cm LT knee to improve active flexion to 130 degeesso she can walk comfortabley in and out of home   Time 8   Period Weeks   Status New    PT LONG TERM GOAL #3   Title She will be able to walk 12 steps with one rail up and down safely for community access.    Time 8   Period Weeks   Status New   PT LONG TERM GOAL #4   Title She will be independent with all HEP issued as of last visit   Time 8   Period Weeks   Status New   PT LONG TERM GOAL #5   Title She will report no pain with normal home tasks   Time 8   Period Weeks   Status New               Plan - 02/18/16 1435    Clinical Impression Statement Improved tolerance to hamstring AROM/ strengthening today. Pt no longer feel pain/popping as she did last session. Worked on balance and weight bearing in parallel bars using 3 way hip with SLS on left with 1 ue support. Pt demonstrates decreased left hip and knee strength. Added to HEP.  She demonstrates quad lag and decreased motor control with LLE AROM exercises.    PT Next Visit Plan Get hip strength baseline. Continue POC, CHECK goals        Problem List Patient Active Problem List   Diagnosis Date Noted  . Primary osteoarthritis of left knee 01/03/2016  . Mild to Moderate Aortic Insufficiency   . Hypertension   . Hyperlipidemia   . Morbid obesity ([place])   . Pulmonary hypertension ([place]) 01/30/2013  . Orthostatic hypotension 12/18/2012  . Aortic valve disorder 07/29/2010  . CHEST PAIN-PRECORDIAL 07/29/2010  . CYST OF THYROID 01/06/2010  . HYPOKALEMIA 01/06/2010  . HYPERLIPIDEMIA 12/24/2009  . OBESITY 12/24/2009  . HYPERTENSION, UNSPECIFIED 12/24/2009  . Personal history of unspecified circulatory disease 12/24/2009  . PEPTIC ULCER DISEASE, HX OF 12/24/2009    Dorene Ar, PTA 02/18/2016, 3:17 PM  [place], [place], [address] Phone: [phone]   Fax:  [phone]  Name: Layten  POPPY BOELENS MRN: [id number] Date of Birth: [birth year]

## 2016-02-24 DIAGNOSIS — Z96652 Presence of left artificial knee joint: Secondary | ICD-10-CM | POA: Diagnosis not present

## 2016-02-24 DIAGNOSIS — Z471 Aftercare following joint replacement surgery: Secondary | ICD-10-CM | POA: Diagnosis not present

## 2016-02-25 ENCOUNTER — Ambulatory Visit: Payer: Medicare Other | Admitting: Physical Therapy

## 2016-02-25 DIAGNOSIS — R262 Difficulty in walking, not elsewhere classified: Secondary | ICD-10-CM

## 2016-02-25 DIAGNOSIS — R29898 Other symptoms and signs involving the musculoskeletal system: Secondary | ICD-10-CM | POA: Diagnosis not present

## 2016-02-25 DIAGNOSIS — R2681 Unsteadiness on feet: Secondary | ICD-10-CM | POA: Diagnosis not present

## 2016-02-25 DIAGNOSIS — M6289 Other specified disorders of muscle: Secondary | ICD-10-CM | POA: Diagnosis not present

## 2016-02-25 DIAGNOSIS — M25662 Stiffness of left knee, not elsewhere classified: Secondary | ICD-10-CM

## 2016-02-25 NOTE — Therapy (Signed)
[place], [place], [address] Phone: [phone]   Fax:  [phone]  Physical Therapy Treatment  Patient Details  Name: Chelsea [name] MRN: [id number] Date of Birth: [birth year] Referring Provider: Frederik Pear, MD  Encounter Date: 02/25/2016      PT End of Session - 02/25/16 1537    Visit Number 5   Number of Visits 16   Date for PT Re-Evaluation 03/30/16   Authorization Type UHC MCR   Authorization Time Period KX at visit 15   PT Start Time 0324   PT Stop Time 0415   PT Time Calculation (min) 51 min      Past Medical History  Diagnosis Date  . Hypertension   . Hyperlipidemia   . Cerebrovascular accident [facility])     a. 2007-->residual left sided weakness  . Chest pain   . Obesity   . Peptic ulcer disease   . Hx of hysterectomy   . Mild to Moderate Aortic Insufficiency     a. 08/2014 Echo: EF 55-60%, mild conc LVH, no rwma, Gr 1 DD, mild-mod AI, mild MR.  . Morbid obesity ([place])   . Depression   . Anxiety   . Headache   . GERD (gastroesophageal reflux disease)     Past Surgical History  Procedure Laterality Date  . Tubal ligation    . Abdominal hysterectomy    . 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  Visit Diagnosis:  Difficulty walking  Stiffness of knee joint, left  Unsteadiness on feet  Weakness of both hips  Weakness of left lower extremity      Subjective Assessment - 02/25/16 1537    Subjective I saw dr Mayer Camel. He wanted to know when I would be able to get off the RW.    Currently in Pain? No/denies            [facility] PT Assessment - 02/25/16 0001    Circumferential Edema   Circumferential - Right mid patella 41 cm   Circumferential - Left  mid patella 45.5 cm   AROM   Left Knee Extension 0   Left  Knee Flexion 120   Strength   Left Knee Flexion 5/5   Left Knee Extension 5/5   Ambulation/Gait   Ambulation Distance (Feet) 250 Feet   Assistive device Straight cane   Gait Pattern Step-through pattern   Ambulation Surface Level;Indoor   Gait Comments Supervision   Berg Balance Test   Sit to Stand Able to stand without using hands and stabilize independently   Standing Unsupported Able to stand safely 2 minutes   Sitting with Back Unsupported but Feet Supported on Floor or Stool Able to sit safely and securely 2 minutes   Stand to Sit Sits safely with minimal use of hands   Transfers Able to transfer safely, minor use of hands   Standing Unsupported with Eyes Closed Able to stand 10 seconds safely   Standing Ubsupported with Feet Together Able to place feet together independently and stand 1 minute safely   From Standing, Reach Forward with Outstretched Arm Can reach confidently >25 cm (10")   From Standing Position, Pick up Object from Floor Able to pick up shoe safely and easily   From Standing Position, Turn to Look Behind Over each  Shoulder Looks behind from both sides and weight shifts well   Turn 360 Degrees Able to turn 360 degrees safely but slowly   Standing Unsupported, Alternately Place Feet on Step/Stool Able to stand independently and complete 8 steps >20 seconds   Standing Unsupported, One Foot in Front Able to plae foot ahead of the other independently and hold 30 seconds   Standing on One Leg Able to lift leg independently and hold 5-10 seconds  10 sec on right   Total Score 51                     [facility] Adult PT Treatment/Exercise - 02/25/16 0001    Ambulation/Gait   Stairs Yes   Stairs Assistance 6: Modified independent (Device/Increase time)   Stair Management Technique Alternating pattern   Number of Stairs 12   Height of Stairs 6   Door Management --   Knee/Hip Exercises: Stretches   Knee: Self-Stretch to increase Flexion 3 reps;30 seconds    Knee: Self-Stretch Limitations seated edge of seat   Knee/Hip Exercises: Supine   Straight Leg Raises 10 reps   Straight Leg Raises Limitations mild lag                  PT Short Term Goals - 02/25/16 1623    PT SHORT TERM GOAL #1   Title she will be independent with inital HEP issued   Status Achieved   PT SHORT TERM GOAL #2   Title She will be able to walk with supervision with SPC safely 200 feet for basisc in home ambulation   Status Achieved   PT SHORT TERM GOAL #3   Title She wioo improve active LT knee flexion to 125 degrees or more    Baseline 120   Time 4   Period Weeks   Status On-going           PT Long Term Goals - 02/25/16 1623    PT LONG TERM GOAL #1   Title she will walk safely in community with SPC.    Time 8   Period Weeks   Status On-going   PT LONG TERM GOAL #2   Title She will improve edema to 44 cm LT knee to improve active flexion to 130 degeesso she can walk comfortabley in and out of home   Time 8   Period Weeks   Status On-going   PT LONG TERM GOAL #3   Title She will be able to walk 12 steps with one rail up and down safely for community access.    Time 8   Period Weeks   Status Achieved   PT LONG TERM GOAL #4   Title She will be independent with all HEP issued as of last visit   Time 8   Period Weeks   Status On-going   PT LONG TERM GOAL #5   Title She will report no pain with normal home tasks   Baseline only when she over does her exercises   Time 8   Period Weeks   Status Partially Met               Plan - 02/25/16 1617    Clinical Impression Statement Pt presents with questions about how long to continue with RW. Retested BERG with pt scoring 51/56 improved from 38/56 at eval 2 weeks ago. She reports working very Hard on her HEP. Knee strength is 5/5 when tested however she demonstrates a mild  quad lag with SLR. Gait training today with quad cane verses SPC. Pt reports she wants to order a quad cane because it  can stand alone. Pt demonstrates left knee snapping into extension with gait however she reports her knee did that prior to TKA. She demonstrates good safety on level surfaces as well as able to negotiate 12 stairs reciprocally  in clinic with one handrail on right. Her left knee edema at mid patella measures at 45.5 cm reduced from 48 cm at eval. Her knee AROM has improved to 120 supine. Instructed pt in seated edge of seat scoot for continued stretching in flexion. PROM measures 125. STG#1,2 Met. LTG#3 Met.    PT Next Visit Plan gait outdoors on unlevel surfaces with SPC, recheck flexion ROM, step ups, closed chain        Problem List Patient Active Problem List   Diagnosis Date Noted  . Primary osteoarthritis of left knee 01/03/2016  . Mild to Moderate Aortic Insufficiency   . Hypertension   . Hyperlipidemia   . Morbid obesity ([place])   . Pulmonary hypertension ([place]) 01/30/2013  . Orthostatic hypotension 12/18/2012  . Aortic valve disorder 07/29/2010  . CHEST PAIN-PRECORDIAL 07/29/2010  . CYST OF THYROID 01/06/2010  . HYPOKALEMIA 01/06/2010  . HYPERLIPIDEMIA 12/24/2009  . OBESITY 12/24/2009  . HYPERTENSION, UNSPECIFIED 12/24/2009  . Personal history of unspecified circulatory disease 12/24/2009  . PEPTIC ULCER DISEASE, HX OF 12/24/2009    Dorene Ar, PTA 02/25/2016, 4:32 PM  [facility] [address] [place], [place], [address] Phone: [phone]   Fax:  [phone]  Name: Chelsea [name] MRN: [id number] Date of Birth: 07-31-1951

## 2016-03-01 ENCOUNTER — Encounter: Payer: Medicare Other | Admitting: Physical Therapy

## 2016-03-02 DIAGNOSIS — R634 Abnormal weight loss: Secondary | ICD-10-CM | POA: Diagnosis not present

## 2016-03-02 DIAGNOSIS — R7309 Other abnormal glucose: Secondary | ICD-10-CM | POA: Diagnosis not present

## 2016-03-02 DIAGNOSIS — I10 Essential (primary) hypertension: Secondary | ICD-10-CM | POA: Diagnosis not present

## 2016-03-02 DIAGNOSIS — I119 Hypertensive heart disease without heart failure: Secondary | ICD-10-CM | POA: Diagnosis not present

## 2016-03-02 DIAGNOSIS — E785 Hyperlipidemia, unspecified: Secondary | ICD-10-CM | POA: Diagnosis not present

## 2016-03-03 ENCOUNTER — Ambulatory Visit: Payer: Medicare Other | Attending: Orthopedic Surgery | Admitting: Physical Therapy

## 2016-03-03 DIAGNOSIS — R262 Difficulty in walking, not elsewhere classified: Secondary | ICD-10-CM | POA: Insufficient documentation

## 2016-03-03 DIAGNOSIS — M25662 Stiffness of left knee, not elsewhere classified: Secondary | ICD-10-CM | POA: Insufficient documentation

## 2016-03-03 DIAGNOSIS — M6281 Muscle weakness (generalized): Secondary | ICD-10-CM | POA: Diagnosis not present

## 2016-03-03 DIAGNOSIS — R2681 Unsteadiness on feet: Secondary | ICD-10-CM | POA: Diagnosis not present

## 2016-03-03 NOTE — Patient Instructions (Addendum)
Knee Extension: Resisted (Sitting)   With band looped around right ankle and under other foot, straighten leg with ankle loop. Keep other leg bent to increase resistance. HOLD 5 Sec.  Repeat ___10_ times per set. Do __2__ sets per session. Do _2___ sessions per day.   Knee Flexion: Resisted (Sitting)   Sit with band under left foot and looped around ankle of supported leg OR PUT KNOT IN DOOR . Pull unsupported leg back. HOLD 5 SEC Repeat ___20_ times per set. Do _2___ sets per session. Do _2___ sessions per day.

## 2016-03-03 NOTE — Therapy (Signed)
[place], [place], [address] Phone: [phone]   Fax:  [phone]  Physical Therapy Treatment  Patient Details  Name: Chelsea [name] MRN: [id number] Date of Birth: 04/25/1951 Referring Provider: Frederik Pear, MD  Encounter Date: 03/03/2016      PT End of Session - 03/03/16 1526    Visit Number 6   Number of Visits 16   Date for PT Re-Evaluation 03/30/16   Authorization Type UHC MCR   Authorization Time Period KX at visit 15   PT Start Time 0300   PT Stop Time 0345   PT Time Calculation (min) 45 min      Past Medical History  Diagnosis Date  . Hypertension   . Hyperlipidemia   . Cerebrovascular accident [facility])     a. 2007-->residual left sided weakness  . Chest pain   . Obesity   . Peptic ulcer disease   . Hx of hysterectomy   . Mild to Moderate Aortic Insufficiency     a. 08/2014 Echo: EF 55-60%, mild conc LVH, no rwma, Gr 1 DD, mild-mod AI, mild MR.  . Morbid obesity ([place])   . Depression   . Anxiety   . Headache   . GERD (gastroesophageal reflux disease)     Past Surgical History  Procedure Laterality Date  . Tubal ligation    . Abdominal hysterectomy    . Total knee arthroplasty Left 01/05/2016    Procedure: TOTAL KNEE ARTHROPLASTY;  Surgeon: Frederik Pear, MD;  Location: [place];  Service: Orthopedics;  Laterality: Left;    There were no vitals filed for this visit.  Visit Diagnosis:  Stiffness of knee joint, left  Unsteadiness on feet  Muscle weakness (generalized)      Subjective Assessment - 03/03/16 1655    Currently in Pain? No/denies                         [facility] Adult PT Treatment/Exercise - 03/03/16 0001    Ambulation/Gait   Stairs Yes   Stairs Assistance 6: Modified independent (Device/Increase time)   Stair Management Technique Alternating pattern;One rail Right   Number of Stairs 12   Height of Stairs 6   Knee/Hip Exercises: Standing   Lateral  Step Up 10 reps   Lateral Step Up Limitations cues for alignment   Forward Step Up 10 reps   Forward Step Up Limitations cues for alignment   Step Down 10 reps   Step Down Limitations cues for alignment, heel strike   Knee/Hip Exercises: Seated   Long Arc Quad 2 sets;10 reps   Long Arc Quad [name] 5 lbs.   Long [employer] Limitations given green band for h/s curls   Hamstring Curl 10 reps   Hamstring Limitations green band - pain ful popping left medial knee   Knee/Hip Exercises: Supine   Short Arc Quad Sets 3 sets;10 reps   Short Arc Quad Sets Limitations 5#    Straight Leg Raises 10 reps   Straight Leg Raises Limitations quad lag                 PT Education - 03/03/16 1546    Education provided Yes   Education Details knee therabnd LAQ and curl green   Person(s) Educated Patient   Methods Explanation;Handout   Comprehension Verbalized understanding          PT Short Term Goals - 03/03/16 1529    PT  SHORT TERM GOAL #1   Title she will be independent with inital HEP issued   Status Achieved   PT SHORT TERM GOAL #2   Title She will be able to walk with supervision with [facility] safely 200 feet for basisc in home ambulation   Status Achieved   PT SHORT TERM GOAL #3   Title She wioo improve active LT knee flexion to 125 degrees or more    Status Achieved           PT Long Term Goals - 03/03/16 1530    PT LONG TERM GOAL #1   Title she will walk safely in community with [place].    Time 8   Period Weeks   Status On-going   PT LONG TERM GOAL #2   Title She will improve edema to 44 cm LT knee to improve active flexion to 130 degeesso she can walk comfortabley in and out of home   Time 8   Period Weeks   Status Partially Met   PT LONG TERM GOAL #3   Title She will be able to walk 12 steps with one rail up and down safely for community access.    Status Achieved   PT LONG TERM GOAL #4   Title She will be independent with all HEP issued as of last visit   Time 8    Period Weeks   Status On-going   PT LONG TERM GOAL #5   Title She will report no pain with normal home tasks   Status Achieved               Plan - 03/03/16 1529    Clinical Impression Statement Pts AROM improved to 125 left knee flexion. STG#3 Met. Her edema has reduced to 44 cm left patella, LTG# 2 partially met, no pain with normal tasks, LTG# 5 Met. Pt's SPC is broken, using duct tape unitl she can boorow a cane. Using RW today. CAn order a new cane April 15th per her insurance. Encouraged her to keep RW unless she has a new cane.    PT Next Visit Plan gait outdoors on unlevel surfaces with SPC, recheck flexion ROM, step ups, closed chain, open chain with [name], Review seated theraband        Problem List Patient Active Problem List   Diagnosis Date Noted  . Primary osteoarthritis of left knee 01/03/2016  . Mild to Moderate Aortic Insufficiency   . Hypertension   . Hyperlipidemia   . Morbid obesity ([place])   . Pulmonary hypertension ([place]) 01/30/2013  . Orthostatic hypotension 12/18/2012  . Aortic valve disorder 07/29/2010  . CHEST PAIN-PRECORDIAL 07/29/2010  . CYST OF THYROID 01/06/2010  . HYPOKALEMIA 01/06/2010  . HYPERLIPIDEMIA 12/24/2009  . OBESITY 12/24/2009  . HYPERTENSION, UNSPECIFIED 12/24/2009  . Personal history of unspecified circulatory disease 12/24/2009  . PEPTIC ULCER DISEASE, HX OF 12/24/2009    Dorene Ar, PTA 03/03/2016, 4:57 PM  [place], [place], [address] Phone: [phone]   Fax:  [phone]  Name: Chelsea [name] MRN: [id number] Date of Birth: 10/07/1951

## 2016-03-04 DIAGNOSIS — G890 Central pain syndrome: Secondary | ICD-10-CM | POA: Diagnosis not present

## 2016-03-04 DIAGNOSIS — G8190 Hemiplegia, unspecified affecting unspecified side: Secondary | ICD-10-CM | POA: Diagnosis not present

## 2016-03-09 ENCOUNTER — Ambulatory Visit: Payer: Medicare Other

## 2016-03-09 DIAGNOSIS — R2681 Unsteadiness on feet: Secondary | ICD-10-CM | POA: Diagnosis not present

## 2016-03-09 DIAGNOSIS — M6281 Muscle weakness (generalized): Secondary | ICD-10-CM | POA: Diagnosis not present

## 2016-03-09 DIAGNOSIS — R262 Difficulty in walking, not elsewhere classified: Secondary | ICD-10-CM

## 2016-03-09 DIAGNOSIS — M25662 Stiffness of left knee, not elsewhere classified: Secondary | ICD-10-CM

## 2016-03-09 NOTE — Therapy (Signed)
[place], [place], [address] Phone: [phone]   Fax:  [phone]  Physical Therapy Treatment  Patient Details  Name: Chelsea [name] MRN: [id number] Date of Birth: Jan 03, 1951 Referring Provider: Frederik Pear, MD  Encounter Date: 03/09/2016      PT End of Session - 03/09/16 1521    Visit Number 7   Number of Visits 16   Date for PT Re-Evaluation 03/30/16   PT Start Time 0225   PT Stop Time 0312   PT Time Calculation (min) 47 min   Activity Tolerance Patient tolerated treatment well   Behavior During Therapy [facility] for tasks assessed/performed      Past Medical History  Diagnosis Date  . Hypertension   . Hyperlipidemia   . Cerebrovascular accident [facility])     a. 2007-->residual left sided weakness  . Chest pain   . Obesity   . Peptic ulcer disease   . Hx of hysterectomy   . Mild to Moderate Aortic Insufficiency     a. 08/2014 Echo: EF 55-60%, mild conc LVH, no rwma, Gr 1 DD, mild-mod AI, mild MR.  . Morbid obesity ([place])   . Depression   . Anxiety   . Headache   . GERD (gastroesophageal reflux disease)     Past Surgical History  Procedure Laterality Date  . Tubal ligation    . Abdominal hysterectomy    . 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      Subjective Assessment - 03/09/16 1431    Subjective I don't use the RW now except to come in here.    Currently in Pain? No/denies            [facility] PT Assessment - 03/09/16 1443    AROM   Left Knee Extension 0   Left Knee Flexion 120   Strength   Right Knee Flexion 5/5   Right Knee Extension 5/5   Left Knee Flexion 5/5   Left Knee Extension 5/5                     [facility] Adult PT Treatment/Exercise - 03/09/16 1432    Ambulation/Gait   Ambulation  Distance (Feet) 500 Feet   Assistive device Straight cane   Gait Pattern Step-through pattern   Ambulation Surface Level;Indoor;Outdoor;Paved   Door Management 5: Supervision   Gait Comments inclines up and down outside   Knee/Hip Exercises: Aerobic   Recumbent Bike Rec bike L2 x 6  min   Knee/Hip Exercises: Standing   Lateral Step Up Left;Step Height: 8";Hand Hold: 2  12 reps   Lateral Step Up Limitations cues for max LT quad efefort   Forward Step Up 15 reps;Hand Hold: 2;Step Height: 8"   Forward Step Up Limitations cued for max quad effort   Knee/Hip Exercises: Seated   Other Seated Knee/Hip Exercises worked on stabilization LT knee with SAQ to -10-15  degreeswith 3 pounds and red band to pull up and after band tension off hold foot to not have foot lower. x 12   Sit to Sand 10 reps;2 sets                  PT Short Term Goals - 03/03/16 1529    PT SHORT TERM GOAL #1  Title she will be independent with inital HEP issued   Status Achieved   PT SHORT TERM GOAL #2   Title She will be able to walk with supervision with [facility] safely 200 feet for basisc in home ambulation   Status Achieved   PT SHORT TERM GOAL #3   Title She wioo improve active LT knee flexion to 125 degrees or more    Status Achieved           PT Long Term Goals - 03/09/16 1525    PT LONG TERM GOAL #1   Title she will walk safely in community with [place].    Status On-going   PT LONG TERM GOAL #2   Title She will improve edema to 44 cm LT knee to improve active flexion to 130 degeesso she can walk comfortabley in and out of home   Status Partially Met   PT LONG TERM GOAL #3   Title She will be able to walk 12 steps with one rail up and down safely for community access.    Status Achieved   PT LONG TERM GOAL #4   Title She will be independent with all HEP issued as of last visit   Status On-going   PT LONG TERM GOAL #5   Title She will report no pain with normal home tasks   Status Achieved                Plan - 03/09/16 1521    Clinical Impression Statement LT knee flexion to 120 degrees today so we worked on self stretching with knee to chest. She did better with LT Leg strength and use of LT leg to step up and lower  and was shaky Lt  quad and leg with exercise after step ups  showed fatigue after this   PT Next Visit Plan Continued closed chain exercises and try on grass next visit with cane. review flexion stretching and active ROM   PT Home Exercise Plan LT knee flexion stretch   Consulted and Agree with Plan of Care Patient      Patient will benefit from skilled therapeutic intervention in order to improve the following deficits and impairments:  Decreased strength, Decreased balance, Pain, Decreased activity tolerance, Difficulty walking, Decreased range of motion  Visit Diagnosis: Stiffness of knee joint, left  Muscle weakness (generalized)  Unsteadiness on feet  Difficulty in walking, not elsewhere classified     Problem List Patient Active Problem List   Diagnosis Date Noted  . Primary osteoarthritis of left knee 01/03/2016  . Mild to Moderate Aortic Insufficiency   . Hypertension   . Hyperlipidemia   . Morbid obesity ([place])   . Pulmonary hypertension ([place]) 01/30/2013  . Orthostatic hypotension 12/18/2012  . Aortic valve disorder 07/29/2010  . CHEST PAIN-PRECORDIAL 07/29/2010  . CYST OF THYROID 01/06/2010  . HYPOKALEMIA 01/06/2010  . HYPERLIPIDEMIA 12/24/2009  . OBESITY 12/24/2009  . HYPERTENSION, UNSPECIFIED 12/24/2009  . Personal history of unspecified circulatory disease 12/24/2009  . PEPTIC ULCER DISEASE, HX OF 12/24/2009    Darrel Hoover PT 03/09/2016, 3:27 PM  [place] [facility] [address] [place], [place], [address] Phone: [phone]   Fax:  [phone]  Name: Chelsea [name] MRN: [id number] Date of Birth: 01/18/1951

## 2016-03-15 ENCOUNTER — Ambulatory Visit: Payer: Medicare Other | Admitting: Physical Therapy

## 2016-03-15 DIAGNOSIS — M25662 Stiffness of left knee, not elsewhere classified: Secondary | ICD-10-CM | POA: Diagnosis not present

## 2016-03-15 DIAGNOSIS — M6281 Muscle weakness (generalized): Secondary | ICD-10-CM | POA: Diagnosis not present

## 2016-03-15 DIAGNOSIS — R2681 Unsteadiness on feet: Secondary | ICD-10-CM

## 2016-03-15 DIAGNOSIS — R262 Difficulty in walking, not elsewhere classified: Secondary | ICD-10-CM | POA: Diagnosis not present

## 2016-03-15 NOTE — Therapy (Signed)
[place], [place], [address] Phone: [phone]   Fax:  [phone]  Physical Therapy Treatment  Patient Details  Name: GENESE QUEBEDEAUX MRN: [id number] Date of Birth: Jun 02, 1951 Referring Provider: Frederik Pear, MD  Encounter Date: 03/15/2016      PT End of Session - 03/15/16 1154    Visit Number 8   Number of Visits 16   Date for PT Re-Evaluation 03/30/16   Authorization Type UHC MCR   Authorization Time Period KX at visit 15   PT Start Time 1100   PT Stop Time 1140   PT Time Calculation (min) 40 min      Past Medical History  Diagnosis Date  . Hypertension   . Hyperlipidemia   . Cerebrovascular accident [facility])     a. 2007-->residual left sided weakness  . Chest pain   . Obesity   . Peptic ulcer disease   . Hx of hysterectomy   . Mild to Moderate Aortic Insufficiency     a. 08/2014 Echo: EF 55-60%, mild conc LVH, no rwma, Gr 1 DD, mild-mod AI, mild MR.  . Morbid obesity ([place])   . Depression   . Anxiety   . Headache   . GERD (gastroesophageal reflux disease)     Past Surgical History  Procedure Laterality Date  . Tubal ligation    . Abdominal hysterectomy    . 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                       [facility] Adult PT Treatment/Exercise - 03/15/16 0001    Ambulation/Gait   Ambulation Distance (Feet) 500 Feet   Assistive device Straight cane   Gait Pattern Step-through pattern   Ambulation Surface Outdoor;Unlevel   Door Management 5: Supervision   Gait Comments inclines, declines, curbs grass, dirt al with good safety and good sequencing for curbs   Knee/Hip Exercises: Stretches   Active Hamstring Stretch 2 reps;30 seconds   Active Hamstring Stretch Limitations seated   Knee:  Self-Stretch to increase Flexion 3 reps;30 seconds   Knee: Self-Stretch Limitations supine knee to chest, cues to relax leg and used arm   Knee/Hip Exercises: Prone   Hamstring Curl 2 sets;10 reps   Hamstring Curl Limitations 3# pt reports increased posterior knee pain   Manual Therapy   Manual Therapy Soft tissue mobilization;Passive ROM   Soft tissue mobilization Posterior knee, hamstring soft tissue work. Most tender popliteal fossa   Passive ROM Prone knee flexion 5 x 30 sec to tolerance                  PT Short Term Goals - 03/03/16 1529    PT SHORT TERM GOAL #1   Title she will be independent with inital HEP issued   Status Achieved   PT SHORT TERM GOAL #2   Title She will be able to walk with supervision with [facility] safely 200 feet for basisc in home ambulation   Status Achieved   PT SHORT TERM GOAL #3   Title She wioo improve active LT knee flexion to 125 degrees or more    Status Achieved           PT Long Term Goals - 03/15/16 1158  PT LONG TERM GOAL #1   Title she will walk safely in community with [facility].    Status Achieved   PT LONG TERM GOAL #2   Title She will improve edema to 44 cm LT knee to improve active flexion to 130 degeesso she can walk comfortabley in and out of home   Baseline 120 Active flexion, 130 passive   Time 8   Period Weeks   Status Partially Met   PT LONG TERM GOAL #3   Title She will be able to walk 12 steps with one rail up and down safely for community access.    Status Achieved   PT LONG TERM GOAL #4   Title She will be independent with all HEP issued as of last visit   Time 8   Period Weeks   Status On-going   PT LONG TERM GOAL #5   Title She will report no pain with normal home tasks   Status Achieved               Plan - 03/15/16 1154    Clinical Impression Statement Active knee flexion still 120, Passive 130. Pt reports increased hamstring/posterior knee pain due to straining with bending exercises and just  pushing herself despite pain. Encouraged pt to decrease reps if straining. Used manual techniques to decrease posterior knee pain. Trigger points present in distal hamstrings. Gait training outdoors and on unlevel surfaces with SPC and good safety. Pt reports she has been using SPC in community including shopping without difficulty. LTG#1 Met.    PT Next Visit Plan flexion stretching and hamstring strengthening as tolerated, manual to posterior knee as needed, closed chain quad strength FOTO soon      Patient will benefit from skilled therapeutic intervention in order to improve the following deficits and impairments:  Decreased strength, Decreased balance, Pain, Decreased activity tolerance, Difficulty walking, Decreased range of motion  Visit Diagnosis: Stiffness of knee joint, left  Muscle weakness (generalized)  Unsteadiness on feet  Difficulty in walking, not elsewhere classified     Problem List Patient Active Problem List   Diagnosis Date Noted  . Primary osteoarthritis of left knee 01/03/2016  . Mild to Moderate Aortic Insufficiency   . Hypertension   . Hyperlipidemia   . Morbid obesity ([place])   . Pulmonary hypertension ([place]) 01/30/2013  . Orthostatic hypotension 12/18/2012  . Aortic valve disorder 07/29/2010  . CHEST PAIN-PRECORDIAL 07/29/2010  . CYST OF THYROID 01/06/2010  . HYPOKALEMIA 01/06/2010  . HYPERLIPIDEMIA 12/24/2009  . OBESITY 12/24/2009  . HYPERTENSION, UNSPECIFIED 12/24/2009  . Personal history of unspecified circulatory disease 12/24/2009  . PEPTIC ULCER DISEASE, HX OF 12/24/2009    Dorene Ar, PTA 03/15/2016, 12:02 PM  [place], [place], [address] Phone: [phone]   Fax:  [phone]  Name: ARELIZ ROTHMAN MRN: [id number] Date of Birth: [birth year]

## 2016-03-16 ENCOUNTER — Ambulatory Visit: Payer: Medicare Other | Admitting: Physical Therapy

## 2016-03-16 DIAGNOSIS — M25662 Stiffness of left knee, not elsewhere classified: Secondary | ICD-10-CM

## 2016-03-16 DIAGNOSIS — M6281 Muscle weakness (generalized): Secondary | ICD-10-CM | POA: Diagnosis not present

## 2016-03-16 DIAGNOSIS — R262 Difficulty in walking, not elsewhere classified: Secondary | ICD-10-CM | POA: Diagnosis not present

## 2016-03-16 DIAGNOSIS — R2681 Unsteadiness on feet: Secondary | ICD-10-CM | POA: Diagnosis not present

## 2016-03-16 NOTE — Therapy (Signed)
[place], [place], [address] Phone: [phone]   Fax:  [phone]  Physical Therapy Treatment  Patient Details  Name: Chelsea [name] MRN: [id number] Date of Birth: 01-08-1951 Referring Provider: Frederik Pear, MD  Encounter Date: 03/16/2016      PT End of Session - 03/16/16 1650    Visit Number 9   Number of Visits 16   Date for PT Re-Evaluation 03/30/16   Authorization Type UHC MCR   PT Start Time 2449   PT Stop Time 1625   PT Time Calculation (min) 40 min   Activity Tolerance Patient tolerated treatment well   Behavior During Therapy [facility] for tasks assessed/performed      Past Medical History  Diagnosis Date  . Hypertension   . Hyperlipidemia   . Cerebrovascular accident [facility])     a. 2007-->residual left sided weakness  . Chest pain   . Obesity   . Peptic ulcer disease   . Hx of hysterectomy   . Mild to Moderate Aortic Insufficiency     a. 08/2014 Echo: EF 55-60%, mild conc LVH, no rwma, Gr 1 DD, mild-mod AI, mild MR.  . Morbid obesity ([place])   . Depression   . Anxiety   . Headache   . GERD (gastroesophageal reflux disease)     Past Surgical History  Procedure Laterality Date  . Tubal ligation    . Abdominal hysterectomy    . 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      Subjective Assessment - 03/16/16 1550    Subjective "I 've been sore in the back of the knee from doing the exercises"    Currently in Pain? Yes   Pain Score 2    Pain Orientation Left   Pain Descriptors / Indicators Throbbing   Pain Type Surgical pain   Pain Onset More than a month ago   Pain Frequency Intermittent   Aggravating Factors  working too much on the knee bending   Pain Relieving Factors rest, meds, ice            [facility] PT  Assessment - 03/16/16 0001    Ambulation/Gait   Number of Stairs 12   Height of Stairs 6   Door Management 7: Independent                     [facility] Adult PT Treatment/Exercise - 03/16/16 1555    Knee/Hip Exercises: Stretches   Active Hamstring Stretch 2 reps;30 seconds   Knee: Self-Stretch to increase Flexion 4 reps;20 seconds  with strap in prone   Knee/Hip Exercises: Standing   Heel Raises 2 sets;20 reps   Wall Squat 2 sets;10 reps  with ball between knees to facilitate proper alignment   Gait Training in // exagerrated heel strike and toe off x 4, 2 x 30 ft with SPC   reported felt easier and decreased forced knee extension   Knee/Hip Exercises: Seated   Sit to Sand --  3 x 7 with table lowered between sets   Manual Therapy   Soft tissue mobilization IASTM of distal hamstrings with increased focus on semi-tendinosus  reported decreased pain                PT Education - 03/16/16 1657  Education provided Yes   Education Details gait training with heel to toe technique   Person(s) Educated Patient   Methods Explanation   Comprehension Verbalized understanding          PT Short Term Goals - 03/03/16 1529    PT SHORT TERM GOAL #1   Title she will be independent with inital HEP issued   Status Achieved   PT SHORT TERM GOAL #2   Title She will be able to walk with supervision with SPC safely 200 feet for basisc in home ambulation   Status Achieved   PT SHORT TERM GOAL #3   Title She wioo improve active LT knee flexion to 125 degrees or more    Status Achieved           PT Long Term Goals - 03/15/16 1158    PT LONG TERM GOAL #1   Title she will walk safely in community with [place].    Status Achieved   PT LONG TERM GOAL #2   Title She will improve edema to 44 cm LT knee to improve active flexion to 130 degeesso she can walk comfortabley in and out of home   Baseline 120 Active flexion, 130 passive   Time 8   Period Weeks   Status  Partially Met   PT LONG TERM GOAL #3   Title She will be able to walk 12 steps with one rail up and down safely for community access.    Status Achieved   PT LONG TERM GOAL #4   Title She will be independent with all HEP issued as of last visit   Time 8   Period Weeks   Status On-going   PT LONG TERM GOAL #5   Title She will report no pain with normal home tasks   Status Achieved               Plan - 03/16/16 1651    Clinical Impression Statement Mrs. Nguyen reports that she is sore but is doing better since the last session. following stretching and  IASTM of the hamstrings she reported relief of pain. Following gait training she reported decreasd popping back of the knee and she felt it helped her stand up straighter. pt declined MHP post session.    PT Next Visit Plan flexion stretching and hamstring strengthening as tolerated, manual to posterior knee as needed, closed chain quad strength FOTO soon   Consulted and Agree with Plan of Care Patient      Patient will benefit from skilled therapeutic intervention in order to improve the following deficits and impairments:  Decreased strength, Decreased balance, Pain, Decreased activity tolerance, Difficulty walking, Decreased range of motion  Visit Diagnosis: Stiffness of knee joint, left  Muscle weakness (generalized)  Unsteadiness on feet  Difficulty in walking, not elsewhere classified     Problem List Patient Active Problem List   Diagnosis Date Noted  . Primary osteoarthritis of left knee 01/03/2016  . Mild to Moderate Aortic Insufficiency   . Hypertension   . Hyperlipidemia   . Morbid obesity ([place])   . Pulmonary hypertension ([place]) 01/30/2013  . Orthostatic hypotension 12/18/2012  . Aortic valve disorder 07/29/2010  . CHEST PAIN-PRECORDIAL 07/29/2010  . CYST OF THYROID 01/06/2010  . HYPOKALEMIA 01/06/2010  . HYPERLIPIDEMIA 12/24/2009  . OBESITY 12/24/2009  . HYPERTENSION, UNSPECIFIED 12/24/2009  .  Personal history of unspecified circulatory disease 12/24/2009  . PEPTIC ULCER DISEASE, HX OF 12/24/2009   Starr Lake PT, DPT,  LAT, ATC  03/16/2016  4:58 PM      [facility] [address] [place], [place], [address] Phone: [phone]   Fax:  [phone]  Name: CORIE ALLIS MRN: [id number] Date of Birth: [birth year]

## 2016-03-17 ENCOUNTER — Encounter: Payer: Medicare Other | Attending: Family Medicine | Admitting: Dietician

## 2016-03-17 ENCOUNTER — Encounter: Payer: Self-pay | Admitting: Dietician

## 2016-03-17 VITALS — Ht 66.0 in | Wt 203.6 lb

## 2016-03-17 DIAGNOSIS — E669 Obesity, unspecified: Secondary | ICD-10-CM | POA: Insufficient documentation

## 2016-03-17 NOTE — Progress Notes (Signed)
  Medical Nutrition Therapy:  Appt start time: 200 end time:  230   Assessment:  Primary concerns today: Chelsea [name] is here today for a follow up to try to lose weight with a 16 lbs weight loss in the past 4 months. Had a joint/knee replacement since last visit. Was on a low calorie diet in the hospital. Cut out most junk food and only drinking ginger ale for sodas once in a while. Eating yogurt or fruit cups for snacks. Has 2 more weeks of physical therapy left.  Eating more meals throughout the day.   Preferred Learning Style:   No preference indicated   Learning Readiness:   Ready  MEDICATIONS: see list   DIETARY INTAKE:  Usual eating pattern includes 2  meals and 1 snacks per day.  Avoided foods include mushrooms, okra    24-hr recall:  B ( AM):special k with 2% milk with slice of toast and bacon and egg Snk ( AM): fruit cup or yogurt L (12 PM): bologna sandwich with mayo with fruit cup and slice of toast Snk ( PM): grapes D ( PM): chicken, small amount of mashed potatoes, and applesauce Snk ( PM): yogurt Beverages: water, Ocean Spray 5 calorie, 1/2 bottle Powerade Zero, coffee sometimes with stevia  Usual physical activity: not released to do any exercise outside of physical therapy  Estimated energy needs: 1600 calories 180 g carbohydrates 120 g protein 44 g fat  Progress Towards Goal(s):  In progress.   Nutritional Diagnosis:  [place]-3.3 Overweight/obesity As related to hx of energy dense food choices and sugary beverages.  As evidenced by BMI of 35.9.    Intervention:  Nutrition counseling provided. Plan: Continue eat at least 3 x day. Have just cereal with egg and bacon (leave off toast if not that hungry). Just have sandwich with fruit at lunch (leave off toast if not that hungry). Continue to use a small plate to help with portion sizes again. Start doing exercise again when you are able to.   Teaching Method Utilized:  Visual Auditory Hands on  Handouts  given during visit include:  none  Barriers to learning/adherence to lifestyle change: limited income, limited mobility  Demonstrated degree of understanding via:  Teach Back   Monitoring/Evaluation:  Dietary intake, exercise, and body weight in 3 month(s).

## 2016-03-17 NOTE — Patient Instructions (Addendum)
Continue eat at least 3 x day. Have just cereal with egg and bacon (leave off toast if not that hungry). Just have sandwich with fruit at lunch (leave off toast if not that hungry). Continue to use a small plate to help with portion sizes again. Start doing exercise again when you are able to.

## 2016-03-22 ENCOUNTER — Ambulatory Visit: Payer: Medicare Other | Admitting: Physical Therapy

## 2016-03-22 DIAGNOSIS — R262 Difficulty in walking, not elsewhere classified: Secondary | ICD-10-CM | POA: Diagnosis not present

## 2016-03-22 DIAGNOSIS — M6281 Muscle weakness (generalized): Secondary | ICD-10-CM | POA: Diagnosis not present

## 2016-03-22 DIAGNOSIS — R2681 Unsteadiness on feet: Secondary | ICD-10-CM

## 2016-03-22 DIAGNOSIS — M25662 Stiffness of left knee, not elsewhere classified: Secondary | ICD-10-CM | POA: Diagnosis not present

## 2016-03-22 NOTE — Therapy (Signed)
[place], [place], [address] Phone: [phone]   Fax:  [phone]  Physical Therapy Treatment  Patient Details  Name: Chelsea [name] MRN: [id number] Date of Birth: 31-Oct-1951 Referring Provider: Frederik Pear, MD  Encounter Date: 03/22/2016      PT End of Session - 03/22/16 1435    Visit Number 10   Number of Visits 16   Date for PT Re-Evaluation 03/30/16   PT Start Time 9326   PT Stop Time 1315   PT Time Calculation (min) 45 min      Past Medical History  Diagnosis Date  . Hypertension   . Hyperlipidemia   . Cerebrovascular accident [facility])     a. 2007-->residual left sided weakness  . Chest pain   . Obesity   . Peptic ulcer disease   . Hx of hysterectomy   . Mild to Moderate Aortic Insufficiency     a. 08/2014 Echo: EF 55-60%, mild conc LVH, no rwma, Gr 1 DD, mild-mod AI, mild MR.  . Morbid obesity ([place])   . Depression   . Anxiety   . Headache   . GERD (gastroesophageal reflux disease)     Past Surgical History  Procedure Laterality Date  . Tubal ligation    . Abdominal hysterectomy    . 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      Subjective Assessment - 03/22/16 1418    Subjective No pain. Mentioned that the back of her knee and her back have been sore today because of the weather.   Currently in Pain? No/denies            [facility] PT Assessment - 03/22/16 0001    AROM   Left Knee Flexion 121   PROM   Left Knee Flexion 130                     [facility] Adult PT Treatment/Exercise - 03/22/16 0001    Knee/Hip Exercises: Stretches   Active Hamstring Stretch 2 reps;30 seconds   Knee: Self-Stretch to increase Flexion Both;2 reps;30 seconds prone with strap   Knee/Hip Exercises: Standing   Heel Raises  Both;3 sets;10 reps   Wall Squat 3 sets;10 reps   Other Standing Knee Exercises sit-to-stand  3x x 10   Knee/Hip Exercises: Prone   Hamstring Curl 2 sets;10 reps  second set against resistance of my hand.                  PT Short Term Goals - 03/03/16 1529    PT SHORT TERM GOAL #1   Title she will be independent with inital HEP issued   Status Achieved   PT SHORT TERM GOAL #2   Title She will be able to walk with supervision with SPC safely 200 feet for basisc in home ambulation   Status Achieved   PT SHORT TERM GOAL #3   Title She wioo improve active LT knee flexion to 125 degrees or more    Status Achieved           PT Long Term Goals - 03/15/16 1158    PT LONG TERM GOAL #1   Title she will walk safely in community with [place].    Status Achieved   PT LONG TERM  GOAL #2   Title She will improve edema to 44 cm LT knee to improve active flexion to 130 degeesso she can walk comfortabley in and out of home   Baseline 120 Active flexion, 130 passive   Time 8   Period Weeks   Status Partially Met   PT LONG TERM GOAL #3   Title She will be able to walk 12 steps with one rail up and down safely for community access.    Status Achieved   PT LONG TERM GOAL #4   Title She will be independent with all HEP issued as of last visit   Time 8   Period Weeks   Status On-going   PT LONG TERM GOAL #5   Title She will report no pain with normal home tasks   Status Achieved               Plan - 03/22/16 1427    Clinical Impression Statement Stated that she is sore but no pain and that the weather might be making the soreness worse. Needed brief breaks during session because of fatigue. Mentioned that she felt the standing calf raises in her knee, calves, and leg. Made progress with LTG #2. Still needs work on Environmental health practitioner.   PT Next Visit Plan Continue to focus on hamstring strengthening so she can reach LTG #2 of reaching 130 degrees actively. Continue with  flexion stretching. Next visit will be her final one.NEEDS FOTO      Patient will benefit from skilled therapeutic intervention in order to improve the following deficits and impairments:  Decreased strength, Decreased balance, Pain, Decreased activity tolerance, Difficulty walking, Decreased range of motion  Visit Diagnosis: Stiffness of knee joint, left  Muscle weakness (generalized)  Unsteadiness on feet  Difficulty in walking, not elsewhere classified     Problem List Patient Active Problem List   Diagnosis Date Noted  . Primary osteoarthritis of left knee 01/03/2016  . Mild to Moderate Aortic Insufficiency   . Hypertension   . Hyperlipidemia   . Morbid obesity ([place])   . Pulmonary hypertension ([place]) 01/30/2013  . Orthostatic hypotension 12/18/2012  . Aortic valve disorder 07/29/2010  . CHEST PAIN-PRECORDIAL 07/29/2010  . CYST OF THYROID 01/06/2010  . HYPOKALEMIA 01/06/2010  . HYPERLIPIDEMIA 12/24/2009  . OBESITY 12/24/2009  . HYPERTENSION, UNSPECIFIED 12/24/2009  . Personal history of unspecified circulatory disease 12/24/2009  . PEPTIC ULCER DISEASE, HX OF 12/24/2009   Chelsea [name], [address], [place] 03/22/2016, 3:07 PM  [facility] [address] [place], [place], [address] Phone: [phone]   Fax:  [phone]  Name: Chelsea [name] MRN: [id number] Date of Birth: [birth year]

## 2016-03-24 ENCOUNTER — Ambulatory Visit: Payer: Medicare Other | Admitting: Physical Therapy

## 2016-03-24 DIAGNOSIS — R2681 Unsteadiness on feet: Secondary | ICD-10-CM | POA: Diagnosis not present

## 2016-03-24 DIAGNOSIS — M6281 Muscle weakness (generalized): Secondary | ICD-10-CM | POA: Diagnosis not present

## 2016-03-24 DIAGNOSIS — R262 Difficulty in walking, not elsewhere classified: Secondary | ICD-10-CM | POA: Diagnosis not present

## 2016-03-24 DIAGNOSIS — M25662 Stiffness of left knee, not elsewhere classified: Secondary | ICD-10-CM

## 2016-03-24 NOTE — Therapy (Signed)
[place], [place], [address] Phone: [phone]   Fax:  [phone]  Physical Therapy Treatment / Discharge Note  Patient Details  Name: Chelsea [name] MRN: [id number] Date of Birth: 1951/01/20 Referring Provider: Frederik Pear, MD  Encounter Date: 03/24/2016      PT End of Session - 03/24/16 1153    Visit Number 11   Number of Visits 16   Date for PT Re-Evaluation 03/30/16   Authorization Type [employer] MCR   PT Start Time 8889   PT Stop Time 1218   PT Time Calculation (min) 33 min   Activity Tolerance Patient tolerated treatment well   Behavior During Therapy [facility] for tasks assessed/performed      Past Medical History  Diagnosis Date  . Hypertension   . Hyperlipidemia   . Cerebrovascular accident [facility])     a. 2007-->residual left sided weakness  . Chest pain   . Obesity   . Peptic ulcer disease   . Hx of hysterectomy   . Mild to Moderate Aortic Insufficiency     a. 08/2014 Echo: EF 55-60%, mild conc LVH, no rwma, Gr 1 DD, mild-mod AI, mild MR.  . Morbid obesity ([place])   . Depression   . Anxiety   . Headache   . GERD (gastroesophageal reflux disease)     Past Surgical History  Procedure Laterality Date  . Tubal ligation    . Abdominal hysterectomy    . Total knee arthroplasty Left 01/05/2016    Procedure: TOTAL KNEE ARTHROPLASTY;  Surgeon: Frederik Pear, MD;  Location: [place];  Service: Orthopedics;  Laterality: Left;    There were no vitals filed for this visit.      Subjective Assessment - 03/24/16 1149    Subjective " my back, is  more sore today from having to rearrange my room"   Currently in Pain? No/denies   Pain Location Knee   Pain Orientation Left            [facility] PT Assessment - 03/24/16 1200    Observation/Other Assessments   Focus on Therapeutic Outcomes (FOTO)  45% limited   AROM   Left Knee Flexion 125  AAROM pushing with her other leg   Strength   Right Knee Flexion  5/5   Right Knee Extension 5/5   Left Knee Flexion 5/5   Left Knee Extension 5/5                     [facility] Adult PT Treatment/Exercise - 03/24/16 1152    Knee/Hip Exercises: Stretches   Active Hamstring Stretch 2 reps;30 seconds   Knee/Hip Exercises: Aerobic   Nustep L 8 x 5 min   Knee/Hip Exercises: Seated   Hamstring Curl AROM;Strengthening;Left;2 sets;10 reps                PT Education - 03/24/16 1223    Education provided Yes   Education Details HEP review   Person(s) Educated Patient   Methods Explanation   Comprehension Verbalized understanding          PT Short Term Goals - 03/03/16 1529    PT SHORT TERM GOAL #1   Title she will be independent with inital HEP issued   Status Achieved   PT SHORT TERM GOAL #2   Title She will be able to walk with supervision with [facility] safely 200 feet for basisc in home ambulation   Status Achieved   PT SHORT  TERM GOAL #3   Title She wioo improve active LT knee flexion to 125 degrees or more    Status Achieved           PT Long Term Goals - 10-Apr-2016 1210    PT LONG TERM GOAL #1   Title she will walk safely in community with SPC.    Time 8   Period Weeks   Status Achieved   PT LONG TERM GOAL #2   Title She will improve edema to 44 cm LT knee to improve active flexion to 130 degeesso she can walk comfortabley in and out of home   Baseline AAROM 125, AROM 121   Time 8   Period Weeks   Status Partially Met   PT LONG TERM GOAL #3   Title She will be able to walk 12 steps with one rail up and down safely for community access.    Time 8   Period Weeks   Status Achieved   PT LONG TERM GOAL #4   Title She will be independent with all HEP issued as of last visit   Time 8   Period Weeks   Status Achieved   PT LONG TERM GOAL #5   Title She will report no pain with normal home tasks   Time 8   Period Weeks   Status Achieved               Plan - 04/10/16 1224    Clinical Impression Statement  Mrs. Wrench reports that today is her last day due to insurance issues with coverage. She reported no pain inthe knee/ hamstringst today. she met all goals except for partially meeting LTG #2 today. She has functional ROM of the L knee and improved static strength but continues to demonstrate limitation with endurance. She reports she is able to maintain and progress her current level of function independently and will be discharged from physical therapy today.    PT Next Visit Plan discharge from PT   Consulted and Agree with Plan of Care Patient      Patient will benefit from skilled therapeutic intervention in order to improve the following deficits and impairments:  Decreased strength, Decreased balance, Pain, Decreased activity tolerance, Difficulty walking, Decreased range of motion  Visit Diagnosis: Stiffness of knee joint, left  Muscle weakness (generalized)  Unsteadiness on feet  Difficulty in walking, not elsewhere classified       G-Codes - 10-Apr-2016 1211    Functional Assessment Tool Used FOTO 45% limited/ clinical judgement   Functional Limitation Mobility: Walking and moving around   Mobility: Walking and Moving Around Goal Status [phone]) At least 20 percent but less than 40 percent impaired, limited or restricted   Mobility: Walking and Moving Around Discharge Status [phone]) At least 40 percent but less than 60 percent impaired, limited or restricted      Problem List Patient Active Problem List   Diagnosis Date Noted  . Primary osteoarthritis of left knee 01/03/2016  . Mild to Moderate Aortic Insufficiency   . Hypertension   . Hyperlipidemia   . Morbid obesity ([place])   . Pulmonary hypertension ([place]) 01/30/2013  . Orthostatic hypotension 12/18/2012  . Aortic valve disorder 07/29/2010  . CHEST PAIN-PRECORDIAL 07/29/2010  . CYST OF THYROID 01/06/2010  . HYPOKALEMIA 01/06/2010  . HYPERLIPIDEMIA 12/24/2009  . OBESITY 12/24/2009  . HYPERTENSION, UNSPECIFIED  12/24/2009  . Personal history of unspecified circulatory disease 12/24/2009  . PEPTIC ULCER DISEASE, HX OF 12/24/2009  Starr Lake PT, DPT, LAT, ATC  03/24/2016  12:28 PM      [facility] [address] [place], [place], [address] Phone: [phone]   Fax:  [phone]  Name: Chelsea [name] MRN: [id number] Date of Birth: 02/16/1951  PHYSICAL THERAPY DISCHARGE SUMMARY  Visits from Start of Care: 11  Current functional level related to goals / functional outcomes: FOTO 45% limited   Remaining deficits: Functional L knee AROM. Intermittent soreness in the distal hamstrings in the L compared bil. Limited endurance with weight bearing activities requiring multiple rest breaks.    Education / Equipment: HEP, theraband for strengthening, gait training education  Plan: Patient agrees to discharge.  Patient goals were partially met. Patient is being discharged due to financial reasons.  ?????

## 2016-03-29 ENCOUNTER — Other Ambulatory Visit: Payer: Self-pay | Admitting: *Deleted

## 2016-03-29 MED ORDER — ATORVASTATIN CALCIUM 10 MG PO TABS: 10.0000 mg | ORAL_TABLET | Freq: Every day | ORAL | Status: DC

## 2016-04-06 DIAGNOSIS — M1712 Unilateral primary osteoarthritis, left knee: Secondary | ICD-10-CM | POA: Diagnosis not present

## 2016-04-15 ENCOUNTER — Encounter: Payer: Self-pay | Admitting: *Deleted

## 2016-04-20 ENCOUNTER — Ambulatory Visit (INDEPENDENT_AMBULATORY_CARE_PROVIDER_SITE_OTHER): Payer: Medicare Other | Admitting: Cardiology

## 2016-04-20 ENCOUNTER — Encounter: Payer: Self-pay | Admitting: Cardiology

## 2016-04-20 VITALS — BP 152/102 | HR 67 | Ht 66.0 in | Wt 199.0 lb

## 2016-04-20 DIAGNOSIS — I351 Nonrheumatic aortic (valve) insufficiency: Secondary | ICD-10-CM

## 2016-04-20 DIAGNOSIS — E785 Hyperlipidemia, unspecified: Secondary | ICD-10-CM

## 2016-04-20 DIAGNOSIS — I10 Essential (primary) hypertension: Secondary | ICD-10-CM | POA: Diagnosis not present

## 2016-04-20 DIAGNOSIS — I272 Other secondary pulmonary hypertension: Secondary | ICD-10-CM

## 2016-04-20 DIAGNOSIS — I359 Nonrheumatic aortic valve disorder, unspecified: Secondary | ICD-10-CM

## 2016-04-20 LAB — BASIC METABOLIC PANEL
BUN: 10 mg/dL (ref 7–25)
CO2: 28 mmol/L (ref 20–31)
Calcium: 9.4 mg/dL (ref 8.6–10.4)
Chloride: 103 mmol/L (ref 98–110)
Creat: 1.11 mg/dL — ABNORMAL HIGH (ref 0.50–0.99)
Glucose, Bld: 82 mg/dL (ref 65–99)
Potassium: 4.2 mmol/L (ref 3.5–5.3)
Sodium: 142 mmol/L (ref 135–146)

## 2016-04-20 LAB — LIPID PANEL
Cholesterol: 134 mg/dL (ref 125–200)
HDL: 54 mg/dL (ref >=46)
LDL Cholesterol: 60 mg/dL (ref <130)
Total CHOL/HDL Ratio: 2.5 Ratio (ref <=5.0)
Triglycerides: 99 mg/dL (ref <150)
VLDL: 20 mg/dL (ref <30)

## 2016-04-20 NOTE — Patient Instructions (Signed)
Medication Instructions:  Your physician recommends that you continue on your current medications as directed. Please refer to the Current Medication list given to you today.   Labwork: Lipid profile/BMET today  Testing/Procedures: none  Follow-Up: Your physician recommends that you schedule a follow-up appointment in: 1 year with Dr Aundra Dubin. (May 2018).   Any Other Special Instructions Will Be Listed Below (If Applicable).  Your physician has requested that you regularly monitor and record your blood pressure readings at home. Please use the same machine at the same time of day to check your readings and record them. I will call you in about 2 weeks to get the readings. Eliot Ford       If you need a refill on your cardiac medications before your next appointment, please call your pharmacy.

## 2016-04-21 NOTE — Progress Notes (Signed)
Patient ID: Chelsea [name], female   DOB: [birth year], 65 y.o.   MRN: [id number] PCP: Dr. Criss Rosales  65 yo with history of CVA and left-sided weakness as well as a history of aortic insufficiency presents for cardiology followup.  She is feeling well in general.  No chest pain.  BP is high in the office today but runs systolic [id number] at home.  No palpitations.  No exertional dyspnea.  Some fatigue.    Labs (11/13): K 3.7, creatinine 1.1 Labs (7/14): LDL 43, HDL 46  ECG: NSR, LVH, nonspecific T wave flattening.   PMH: 1. HTN 2. Hyperlipidemia 3. CVA with left-sided weakness in 2007. 4. PUD 5. Aortic insufficiency:  Echo (10/13) showed EF 55%, mild LVH, mild AI, normal RV, moderate TR, PA systolic pressure 38 mmHg.  - Echo (2/17) with EF 55-60%, mild AI.  6. Left TKR (2/17) 7. 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   FH: Grandmother with MI, father with cirrhosis, mother with HTN  SH: Married, nonsmoker, lives in [place].  Current Outpatient Prescriptions  Medication Sig Dispense Refill  . amLODipine-valsartan (EXFORGE) 10-160 MG per tablet Take 1 tablet by mouth daily. 90 tablet 3  . aspirin EC 325 MG tablet Take 1 tablet (325 mg total) by mouth 2 (two) times daily. 30 tablet 0  . atorvastatin (LIPITOR) 10 MG tablet Take 1 tablet (10 mg total) by mouth daily. 90 tablet 0  . Calcium-Magnesium-Vitamin D (CALCIUM 500 PO) Take 1 tablet by mouth daily.     . citalopram (CELEXA) 10 MG tablet Take 10 mg by mouth daily.    . cloNIDine (CATAPRES) 0.3 MG tablet Take 1 tablet (0.3 mg total) by mouth 2 (two) times daily. 180 tablet 2  . cyanocobalamin 100 MCG tablet Take 100 mcg by mouth daily.     Marland Kitchen doxazosin (CARDURA) 8 MG tablet Take 0.5 tablets (4 mg total) by mouth at bedtime. (Patient taking differently: Take 8 mg by mouth at bedtime. ) 30 tablet 3  . ezetimibe (ZETIA) 10 MG tablet Take 10 mg by mouth daily.    . fesoterodine (TOVIAZ) 4 MG TB24 tablet Take 4 mg by mouth at bedtime.     . furosemide (LASIX) 20 MG tablet Take 20 mg by mouth 2 (two) times daily.     Marland Kitchen gabapentin (NEURONTIN) 300 MG capsule Take 1 capsule (300 mg total) by mouth 3 (three) times daily. 270 capsule 3  . methocarbamol (ROBAXIN) 500 MG tablet 1 tablet q 6hr prn muscle spasm 60 tablet 1  . Multiple Vitamin (MULTIVITAMIN) tablet Take 1 tablet by mouth daily.      Marland Kitchen omeprazole (PRILOSEC) 20 MG capsule Take 20 mg by mouth 2 (two) times daily.      . traMADol (ULTRAM) 50 MG tablet Take 1-2 tablets by mouth 3 (three) times daily as needed.  0   No current facility-administered medications for this visit.    BP 152/102 mmHg  Pulse 67  Ht 5\' 6"  (1.676 m)  Wt 199 lb (90.266 kg)  BMI 32.13 kg/m2 General: NAD Neck: No JVD, no thyromegaly or thyroid nodule.  Lungs: Clear to auscultation bilaterally with normal respiratory effort. CV: Nondisplaced PMI.  Heart regular S1/S2, no S3/S4, 1/6 SEM.  No peripheral edema.  No carotid bruit.  Normal pedal pulses.  Abdomen: Soft, nontender, no hepatosplenomegaly, no distention.  Neurologic: Alert and oriented x 3.  Left facial droop and left-sided weakness.  Psych: Normal affect. Extremities: No clubbing or cyanosis.  Assessment/Plan: 1. Hyperlipidemia: Check lipids today.  2. HTN: BP high here today, she says it has been fine at home.  I will have her check BP daily x 2 weeks, we will call for readings.   3. H/o CVA: Continue ASA 81, BP control, statin. 4. Aortic insufficiency: Only mild on most recent echo.  I will not have her get a repeat echo until 2/19 unless symptoms develop.    Loralie Champagne 04/21/2016

## 2016-04-27 ENCOUNTER — Telehealth: Payer: Self-pay | Admitting: Cardiology

## 2016-04-27 NOTE — Telephone Encounter (Signed)
LMTCB

## 2016-04-27 NOTE — Telephone Encounter (Signed)
Spoke with patient about recent lab results 

## 2016-04-27 NOTE — Telephone Encounter (Signed)
Follow Up ° °Pt returned call//  °

## 2016-04-27 NOTE — Telephone Encounter (Signed)
Follow Up   Pt states that she is returning the call

## 2016-05-07 ENCOUNTER — Telehealth: Payer: Self-pay | Admitting: *Deleted

## 2016-05-07 NOTE — Telephone Encounter (Signed)
Copied from Dr Claris Gladden 04/20/16 office note:  HTN: BP high here today, she says it has been fine at home. I will have her check BP daily x 2 weeks, we will call for readings.   05/07/16--pt states BP since office visit with Dr Aundra Dubin 04/20/16 has been in the 119-125/80-90 range, pt states DBP occ >90 but not consistently.   Pt advised I will forward to Dr Aundra Dubin for review.

## 2016-05-07 NOTE — Telephone Encounter (Signed)
Pt.notified

## 2016-05-07 NOTE — Telephone Encounter (Signed)
Those are fine

## 2016-05-11 DIAGNOSIS — H25813 Combined forms of age-related cataract, bilateral: Secondary | ICD-10-CM | POA: Diagnosis not present

## 2016-05-18 ENCOUNTER — Other Ambulatory Visit: Payer: Self-pay | Admitting: Cardiology

## 2016-05-24 ENCOUNTER — Other Ambulatory Visit: Payer: Self-pay | Admitting: Cardiology

## 2016-06-02 DIAGNOSIS — I69952 Hemiplegia and hemiparesis following unspecified cerebrovascular disease affecting left dominant side: Secondary | ICD-10-CM | POA: Diagnosis not present

## 2016-06-02 DIAGNOSIS — I6789 Other cerebrovascular disease: Secondary | ICD-10-CM | POA: Diagnosis not present

## 2016-06-02 DIAGNOSIS — I10 Essential (primary) hypertension: Secondary | ICD-10-CM | POA: Diagnosis not present

## 2016-06-02 DIAGNOSIS — F331 Major depressive disorder, recurrent, moderate: Secondary | ICD-10-CM | POA: Diagnosis not present

## 2016-06-16 ENCOUNTER — Ambulatory Visit: Payer: Medicare Other | Admitting: Dietician

## 2016-06-16 DIAGNOSIS — I69952 Hemiplegia and hemiparesis following unspecified cerebrovascular disease affecting left dominant side: Secondary | ICD-10-CM | POA: Diagnosis not present

## 2016-06-16 DIAGNOSIS — I119 Hypertensive heart disease without heart failure: Secondary | ICD-10-CM | POA: Diagnosis not present

## 2016-06-16 DIAGNOSIS — E785 Hyperlipidemia, unspecified: Secondary | ICD-10-CM | POA: Diagnosis not present

## 2016-06-30 ENCOUNTER — Encounter: Payer: Self-pay | Admitting: Dietician

## 2016-06-30 ENCOUNTER — Encounter: Payer: Medicare Other | Attending: Family Medicine | Admitting: Dietician

## 2016-06-30 DIAGNOSIS — Z713 Dietary counseling and surveillance: Secondary | ICD-10-CM | POA: Diagnosis not present

## 2016-06-30 DIAGNOSIS — E669 Obesity, unspecified: Secondary | ICD-10-CM

## 2016-06-30 NOTE — Progress Notes (Signed)
  Medical Nutrition Therapy:  Appt start time: [id number] end time:  1105   Assessment:  Primary concerns today: Chelsea [name] is here today for a follow up to try to lose weight with a 11 lbs weight loss in the past 3 month! Has lost 27 lbs since December 15th.   Has cut out soda since December. Has chips rarely. No longer craving sweets. No longer having hunger at night. No longer skipping meals.   Preferred Learning Style:   No preference indicated   Learning Readiness:   Ready  MEDICATIONS: see list   DIETARY INTAKE:  Usual eating pattern includes 2  meals and 1 snacks per day.  Avoided foods include mushrooms, okra, black beans    24-hr recall:  B ( AM): 2 boiled eggs with a plums Snk ( AM): fruit or yogurt L (12 PM): baked chicken with brown rice and green beans st Snk ( PM): grapes or other fruit D ( PM): chicken, small amount of mashed potatoes, and applesauce Snk ( PM): yogurt Beverages: water, Ocean Spray 5 calorie, diet green tea, 1/2 bottle Powerade Zero, coffee sometimes with stevia  Usual physical activity: exercises in her bed  Estimated energy needs: 1600 calories 180 g carbohydrates 120 g protein 44 g fat  Progress Towards Goal(s):  In progress.   Nutritional Diagnosis:  -3.3 Overweight/obesity As related to hx of energy dense food choices and sugary beverages.  As evidenced by BMI of 35.9.    Intervention:  Nutrition counseling provided. Plan: Continue eat at least 3 x day. For yogurt try Dannon Light and Fit or Oikos Triple Zero (carbs less than 15 grams). Have snacks only if you are hungry. If you have to have a half bagel, have with protein (egg, peanut butter).  If you want to have ice cream, go out to get a cone.   Try sugar free jello. Continue doing exercises each day.   Teaching Method Utilized:  Visual Auditory Hands on  Handouts given during visit include:  none  Barriers to learning/adherence to lifestyle change: limited income, limited  mobility  Demonstrated degree of understanding via:  Teach Back   Monitoring/Evaluation:  Dietary intake, exercise, and body weight in 3 month(s).

## 2016-06-30 NOTE — Patient Instructions (Addendum)
Continue eat at least 3 x day. For yogurt try Dannon Light and Fit or Oikos Triple Zero (carbs less than 15 grams). Have snacks only if you are hungry. If you have to have a half bagel, have with protein (egg, peanut butter).  If you want to have ice cream, go out to get a cone.   Try sugar free jello. Continue doing exercises each day.

## 2016-07-28 DIAGNOSIS — K219 Gastro-esophageal reflux disease without esophagitis: Secondary | ICD-10-CM | POA: Diagnosis not present

## 2016-07-28 DIAGNOSIS — I69952 Hemiplegia and hemiparesis following unspecified cerebrovascular disease affecting left dominant side: Secondary | ICD-10-CM | POA: Diagnosis not present

## 2016-08-09 ENCOUNTER — Encounter (HOSPITAL_COMMUNITY): Payer: Self-pay

## 2016-08-09 ENCOUNTER — Emergency Department (HOSPITAL_COMMUNITY)
Admission: EM | Admit: 2016-08-09 | Discharge: 2016-08-09 | Disposition: A | Discharge status: Home / Self Care | Payer: Medicare Other | Attending: Emergency Medicine | Admitting: Emergency Medicine

## 2016-08-09 DIAGNOSIS — Z79899 Other long term (current) drug therapy: Secondary | ICD-10-CM | POA: Insufficient documentation

## 2016-08-09 DIAGNOSIS — Z96652 Presence of left artificial knee joint: Secondary | ICD-10-CM | POA: Diagnosis not present

## 2016-08-09 DIAGNOSIS — L309 Dermatitis, unspecified: Secondary | ICD-10-CM | POA: Insufficient documentation

## 2016-08-09 DIAGNOSIS — I10 Essential (primary) hypertension: Secondary | ICD-10-CM | POA: Diagnosis not present

## 2016-08-09 DIAGNOSIS — Z7982 Long term (current) use of aspirin: Secondary | ICD-10-CM | POA: Diagnosis not present

## 2016-08-09 DIAGNOSIS — Z8673 Personal history of transient ischemic attack (TIA), and cerebral infarction without residual deficits: Secondary | ICD-10-CM | POA: Diagnosis not present

## 2016-08-09 DIAGNOSIS — L298 Other pruritus: Secondary | ICD-10-CM | POA: Diagnosis present

## 2016-08-09 LAB — CBC
HCT: 43 % (ref 36.0–46.0)
Hemoglobin: 13.6 g/dL (ref 12.0–15.0)
MCH: 28.4 pg (ref 26.0–34.0)
MCHC: 31.6 g/dL (ref 30.0–36.0)
MCV: 89.8 fL (ref 78.0–100.0)
Platelets: 237 10*3/uL (ref 150–400)
RBC: 4.79 MIL/uL (ref 3.87–5.11)
RDW: 14.6 % (ref 11.5–15.5)
WBC: 4 10*3/uL (ref 4.0–10.5)

## 2016-08-09 LAB — I-STAT CHEM 8, ED
BUN: 14 mg/dL (ref 6–20)
Calcium, Ion: 1.13 mmol/L — ABNORMAL LOW (ref 1.15–1.40)
Chloride: 103 mmol/L (ref 101–111)
Creatinine, Ser: 1.2 mg/dL — ABNORMAL HIGH (ref 0.44–1.00)
Glucose, Bld: 89 mg/dL (ref 65–99)
HCT: 43 % (ref 36.0–46.0)
Hemoglobin: 14.6 g/dL (ref 12.0–15.0)
Potassium: 3.5 mmol/L (ref 3.5–5.1)
Sodium: 145 mmol/L (ref 135–145)
TCO2: 29 mmol/L (ref 0–100)

## 2016-08-09 MED ORDER — RANITIDINE HCL 150 MG PO TABS: 150.0000 mg | ORAL_TABLET | Freq: Two times a day (BID) | ORAL | 0 refills | Status: DC

## 2016-08-09 MED ORDER — DIPHENHYDRAMINE HCL 25 MG PO CAPS: 25.0000 mg | ORAL_CAPSULE | Freq: Four times a day (QID) | ORAL | 0 refills | Status: DC | PRN

## 2016-08-09 MED ORDER — FAMOTIDINE IN NACL 20-0.9 MG/50ML-% IV SOLN
20.0000 mg | Freq: Once | INTRAVENOUS | Status: AC
Start: 2016-08-09 — End: 2016-08-09
  Administered 2016-08-09: 20 mg via INTRAVENOUS
  Filled 2016-08-09: qty 50

## 2016-08-09 MED ORDER — METHYLPREDNISOLONE SODIUM SUCC 125 MG IJ SOLR
125.0000 mg | Freq: Once | INTRAMUSCULAR | Status: AC
  Administered 2016-08-09: 125 mg via INTRAVENOUS
  Filled 2016-08-09: qty 2

## 2016-08-09 MED ORDER — SODIUM CHLORIDE 0.9 % IV BOLUS (SEPSIS)
1000.0000 mL | Freq: Once | INTRAVENOUS | Status: AC
  Administered 2016-08-09: 1000 mL via INTRAVENOUS

## 2016-08-09 MED ORDER — DIPHENHYDRAMINE HCL 50 MG/ML IJ SOLN
50.0000 mg | Freq: Once | INTRAMUSCULAR | Status: AC
  Administered 2016-08-09: 50 mg via INTRAVENOUS
  Filled 2016-08-09: qty 1

## 2016-08-09 MED ORDER — PREDNISONE 20 MG PO TABS: 60.0000 mg | ORAL_TABLET | Freq: Every day | ORAL | 0 refills | Status: AC

## 2016-08-09 NOTE — ED Notes (Signed)
Pt sts swelling is becoming worse, pt reports she cannot see anymore.

## 2016-08-09 NOTE — ED Provider Notes (Signed)
[facility] DEPT Provider Note   CSN: [id number] Arrival date & time: 08/09/16  1054     History   Chief Complaint No chief complaint on file.   HPI Chelsea [name] is a 65 y.o. female.  HPI 65 year old female with past medical history of hypertension, hyperlipidemia, who presents with scalp itching and pain. The patient states that on Friday night, she applied a black hair dye to her hair. She had some mild burning at the time, but wash it out and had no further itching or pain. Throughout the day Saturday and Sunday, she developed progressively worsening itching and redness of her scalp. Over the last 12 hours she has also noticed swelling of her scalp as well as swelling of her bilateral eyelids. She endorses persistent severe itching of her entire scalp, wherever she put the hair dye. Of note, she has had allergic reactions to hair dye in the past. Denies any difficulty swallowing or speaking. Denies any wheezing, nausea or vomiting. No other new medication changes, recent changes in shampoo or other potential allergens.  Past Medical History:  Diagnosis Date  . Anxiety   . Cerebrovascular accident [facility])    a. 2007-->residual left sided weakness  . Chest pain   . Depression   . GERD (gastroesophageal reflux disease)   . Headache   . Hx of hysterectomy   . Hyperlipidemia   . Hypertension   . Mild to Moderate Aortic Insufficiency    a. 08/2014 Echo: EF 55-60%, mild conc LVH, no rwma, Gr 1 DD, mild-mod AI, mild MR.  . Morbid obesity ([place])   . Obesity   . Peptic ulcer disease     Patient Active Problem List   Diagnosis Date Noted  . Primary osteoarthritis of left knee 01/03/2016  . Mild to Moderate Aortic Insufficiency   . Hypertension   . Hyperlipidemia   . Morbid obesity ([place])   . Pulmonary hypertension ([place]) 01/30/2013  . Orthostatic hypotension 12/18/2012  . Aortic valve disorder 07/29/2010  . CHEST PAIN-PRECORDIAL 07/29/2010  . CYST OF THYROID 01/06/2010  .  HYPOKALEMIA 01/06/2010  . HYPERLIPIDEMIA 12/24/2009  . OBESITY 12/24/2009  . HYPERTENSION, UNSPECIFIED 12/24/2009  . Personal history of unspecified circulatory disease 12/24/2009  . PEPTIC ULCER DISEASE, HX OF 12/24/2009    Past Surgical History:  Procedure Laterality Date  . ABDOMINAL HYSTERECTOMY    . 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 TUBAL LIGATION      OB History    No data available       Home Medications    Prior to Admission medications   Medication Sig Start Date End Date Taking? Authorizing Provider  amLODipine-valsartan (EXFORGE) 10-160 MG per tablet Take 1 tablet by mouth daily. 11/15/13  Yes Larey Dresser, MD  aspirin EC 325 MG tablet Take 1 tablet (325 mg total) by mouth 2 (two) times daily. 01/06/16  Yes Frederik Pear, MD  atorvastatin (LIPITOR) 10 MG tablet TAKE 1 TABLET(10 MG) BY MOUTH DAILY 05/25/16  Yes Larey Dresser, MD  Calcium-Magnesium-Vitamin D (CALCIUM 500 PO) Take 1 tablet by mouth daily.    Yes Historical Provider, MD  CARAFATE 1 GM/10ML suspension Take 10 mLs by mouth 4 (four) times daily. 07/29/16  Yes Historical Provider, MD  citalopram (CELEXA) 10 MG tablet Take 10 mg by mouth daily.   Yes Historical Provider, MD  cloNIDine (CATAPRES)  0.3 MG tablet TAKE 1 TABLET BY MOUTH TWICE DAILY 05/19/16  Yes Larey Dresser, MD  cyanocobalamin 100 MCG tablet Take 100 mcg by mouth daily.    Yes Historical Provider, MD  doxazosin (CARDURA) 8 MG tablet Take 0.5 tablets (4 mg total) by mouth at bedtime. Patient taking differently: Take 8 mg by mouth at bedtime.  12/05/12  Yes Hillary Bow, MD  ezetimibe (ZETIA) 10 MG tablet Take 10 mg by mouth daily.   Yes Historical Provider, MD  furosemide (LASIX) 20 MG tablet Take 20 mg by mouth 2 (two) times daily.    Yes Historical Provider, MD  gabapentin (NEURONTIN) 300 MG capsule Take 1 capsule  (300 mg total) by mouth 3 (three) times daily. 02/11/16  Yes Star Age, MD  methocarbamol (ROBAXIN) 500 MG tablet 1 tablet q 6hr prn muscle spasm 01/06/16  Yes Frederik Pear, MD  mirabegron ER (MYRBETRIQ) 25 MG TB24 tablet Take 25 mg by mouth daily.   Yes Historical Provider, MD  Multiple Vitamin (MULTIVITAMIN) tablet Take 1 tablet by mouth daily.     Yes Historical Provider, MD  omeprazole (PRILOSEC) 20 MG capsule Take 20 mg by mouth 2 (two) times daily.     Yes Historical Provider, MD  pantoprazole (PROTONIX) 40 MG tablet Take 40 mg by mouth every evening. 07/28/16  Yes Historical Provider, MD  diphenhydrAMINE (BENADRYL) 25 mg capsule Take 1 capsule (25 mg total) by mouth every 6 (six) hours as needed for itching. 08/09/16 08/16/16  Duffy Bruce, MD  predniSONE (DELTASONE) 20 MG tablet Take 3 tablets (60 mg total) by mouth daily. 08/10/16 08/14/16  Duffy Bruce, MD  ranitidine (ZANTAC) 150 MG tablet Take 1 tablet (150 mg total) by mouth 2 (two) times daily. 08/09/16 08/16/16  Duffy Bruce, MD    Family History Family History  Problem Relation Age of Onset  . Hypertension Mother     deceased  . Peripheral vascular disease Mother   . Cirrhosis Father     DeCEASED  . Heart attack Other     grandmother deceased    Social History Social History  Substance Use Topics  . Smoking status: Never Smoker  . Smokeless tobacco: Never Used  . Alcohol use No     Allergies   Other   Review of Systems Review of Systems  Constitutional: Negative for chills, fatigue and fever.  HENT: Positive for facial swelling. Negative for congestion and rhinorrhea.   Eyes: Negative for visual disturbance.  Respiratory: Negative for cough, shortness of breath and wheezing.   Cardiovascular: Negative for chest pain and leg swelling.  Gastrointestinal: Negative for abdominal pain, diarrhea, nausea and vomiting.  Genitourinary: Negative for dysuria and flank pain.  Musculoskeletal: Negative for neck pain and  neck stiffness.  Skin: Positive for rash. Negative for wound.  Allergic/Immunologic: Negative for immunocompromised state.  Neurological: Negative for syncope, weakness and headaches.  All other systems reviewed and are negative.    Physical Exam Updated Vital Signs BP 148/91   Pulse (!) 49   Temp 97.5 F (36.4 C) (Oral)   Resp 17   Ht 5\' 6"  (1.676 m)   Wt 186 lb (84.4 kg)   SpO2 100%   BMI 30.02 kg/m   Physical Exam  Constitutional: She is oriented to person, place, and time. She appears well-developed and well-nourished. No distress.  HENT:  Head: Normocephalic and atraumatic.  Moderate erythema and urticaria of the scalp. Erythema stops beyond border repair. Moderate bilateral facial lid edema.  Conjunctivae are normal. Vision is intact. No erythema of the face.  Eyes: Conjunctivae are normal. Pupils are equal, round, and reactive to light.  Neck: Normal range of motion. Neck supple.  No stridor  Cardiovascular: Normal rate, regular rhythm and normal heart sounds.  Exam reveals no friction rub.   No murmur heard. Pulmonary/Chest: Effort normal and breath sounds normal. No respiratory distress. She has no wheezes. She has no rales.  Abdominal: Soft. Bowel sounds are normal. She exhibits no distension.  Musculoskeletal: She exhibits no edema.  Neurological: She is alert and oriented to person, place, and time. She exhibits normal muscle tone.  Skin: Skin is warm. Capillary refill takes less than 2 seconds.  Psychiatric: She has a normal mood and affect.  Nursing note and vitals reviewed.    ED Treatments / Results  Labs (all labs ordered are listed, but only abnormal results are displayed) Labs Reviewed  I-STAT CHEM 8, ED - Abnormal; Notable for the following:       Result Value   Creatinine, Ser 1.20 (*)    Calcium, Ion 1.13 (*)    All other components within normal limits  CBC    EKG  EKG Interpretation None       Radiology No results  found.  Procedures Procedures (including critical care time)  Medications Ordered in ED Medications  sodium chloride 0.9 % bolus 1,000 mL (0 mLs Intravenous Stopped 08/09/16 1733)  diphenhydrAMINE (BENADRYL) injection 50 mg (50 mg Intravenous Given 08/09/16 1609)  famotidine (PEPCID) IVPB 20 mg premix (0 mg Intravenous Stopped 08/09/16 1733)  methylPREDNISolone sodium succinate (SOLU-MEDROL) 125 mg/2 mL injection 125 mg (125 mg Intravenous Given 08/09/16 1609)     Initial Impression / Assessment and Plan / ED Course  I have reviewed the triage vital signs and the nursing notes.  Pertinent labs & imaging results that were available during my care of the patient were reviewed by me and considered in my medical decision making (see chart for details).  Clinical Course    65 year old female here with scalp itching and irritation after applying hair dye approximate 72 hours ago. Exam and history is consistent with likely allergic dermatitis with possible component of inflammatory contact dermatitis. No evidence of airway involvement, wheezing, nausea, vomiting or other signs of anaphylaxis. Will give antihistamines, steroids and monitor for improvement. Patient is otherwise well-appearing. Her symptoms correlate directly with the application of hairline. She has no tenderness or pain over her areas of erythema and I do not suspect superimposed or primary cellulitis or other infectious etiology.  1:24 AM scalp erythema significantly improved and bilateral lid edema is improved as well. Patient states her symptoms are improved and she is able to see without difficulty. We will discharge with outpatient course of antihistamines, steroids and topical steroids. Patient is in agreement with this plan  Final Clinical Impressions(s) / ED Diagnoses   Final diagnoses:  Dermatitis    New Prescriptions Discharge Medication List as of 08/09/2016  4:41 PM    START taking these medications   Details   diphenhydrAMINE (BENADRYL) 25 mg capsule Take 1 capsule (25 mg total) by mouth every 6 (six) hours as needed for itching., Starting Mon 08/09/2016, Until Mon 08/16/2016, Print    predniSONE (DELTASONE) 20 MG tablet Take 3 tablets (60 mg total) by mouth daily., Starting Tue 08/10/2016, Until Sat 08/14/2016, Print    ranitidine (ZANTAC) 150 MG tablet Take 1 tablet (150 mg total) by mouth 2 (two) times daily., Starting Mon  08/09/2016, Until Mon 08/16/2016, Print         Duffy Bruce, MD 08/10/16 [phone]

## 2016-08-09 NOTE — ED Triage Notes (Signed)
Patient here with bilateral eye swelling and itching since applying hair color in hair Saturday, no pain but orbital skin tightness, NAD

## 2016-08-09 NOTE — ED Notes (Signed)
Pt's eyes are swollen shut. Can not see.

## 2016-08-09 NOTE — ED Notes (Signed)
Pt.ask when wiil she be getting a room  are working on a room

## 2016-08-09 NOTE — Discharge Instructions (Signed)
-   Avoid any new hair dye or hair products until your scalp fully heals - Try to use shampoo that you know you do not react to

## 2016-08-11 ENCOUNTER — Ambulatory Visit: Payer: Medicare Other | Admitting: Neurology

## 2016-08-26 ENCOUNTER — Other Ambulatory Visit: Payer: Self-pay | Admitting: Gastroenterology

## 2016-08-26 DIAGNOSIS — R10816 Epigastric abdominal tenderness: Secondary | ICD-10-CM

## 2016-08-26 DIAGNOSIS — R1030 Lower abdominal pain, unspecified: Secondary | ICD-10-CM

## 2016-08-26 DIAGNOSIS — K219 Gastro-esophageal reflux disease without esophagitis: Secondary | ICD-10-CM | POA: Diagnosis not present

## 2016-08-26 DIAGNOSIS — R110 Nausea: Secondary | ICD-10-CM | POA: Diagnosis not present

## 2016-08-26 DIAGNOSIS — R1084 Generalized abdominal pain: Secondary | ICD-10-CM

## 2016-09-09 ENCOUNTER — Encounter: Payer: Self-pay | Admitting: Neurology

## 2016-09-09 ENCOUNTER — Ambulatory Visit (INDEPENDENT_AMBULATORY_CARE_PROVIDER_SITE_OTHER): Payer: Medicare Other | Admitting: Neurology

## 2016-09-09 DIAGNOSIS — G8194 Hemiplegia, unspecified affecting left nondominant side: Secondary | ICD-10-CM | POA: Diagnosis not present

## 2016-09-09 DIAGNOSIS — G890 Central pain syndrome: Secondary | ICD-10-CM | POA: Diagnosis not present

## 2016-09-09 DIAGNOSIS — R259 Unspecified abnormal involuntary movements: Secondary | ICD-10-CM | POA: Diagnosis not present

## 2016-09-09 DIAGNOSIS — I619 Nontraumatic intracerebral hemorrhage, unspecified: Secondary | ICD-10-CM | POA: Diagnosis not present

## 2016-09-09 MED ORDER — GABAPENTIN 300 MG PO CAPS: 300.0000 mg | ORAL_CAPSULE | Freq: Three times a day (TID) | ORAL | 3 refills | Status: DC

## 2016-09-09 NOTE — Patient Instructions (Signed)
We will continue with your gabapentin 300 mg 3 times a day.   We can see you in 6 months, you can see one of our nurse practitioners as you are stable. I will see you after that.

## 2016-09-09 NOTE — Progress Notes (Signed)
Subjective:    [name] ID: Chelsea [name] is a 65 y.o. female.  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 Chelsea [name] is unaccompanied today. I last saw Chelsea [name] on 02/11/2016, at which time Chelsea [name] reported that gabapentin was helping. Chelsea [name] did have residual pain on Chelsea left side. Chelsea [name] was noted to have an irregular pulse on my exam and denied any chest pain or shortness of breath, but reported that Chelsea [name] herself had noted irregular heartbeat from time to time. Chelsea [name] had not seen cardiology since 2014. Chelsea [name] had an echocardiogram and EKG for surgical clearance recently.Chelsea [name] underwent left total knee arthroplasty on 01/05/2016, under Dr. Mayer Camel. Chelsea [name] was coming along quite well after that, still was in physical therapy. Unfortunately, Chelsea [name] pain medication had caused severe nausea and vomiting. Chelsea [name] stopped taking narcotic pain medication in February 2017. I suggested Chelsea [name] continue with gabapentin 300 mg 3 times a day.  Today, 09/09/2016: Chelsea [name] reports doing okay as far as Chelsea pain on Chelsea L side. Chelsea [name] was seen by Dr. Aundra Dubin in cardiology on 04/20/2016 and I reviewed Chelsea office note. Chelsea [name] has been feeling okay as far as palpitations. Chelsea [name] has LBP, sees Dr. Jacelyn Grip and has been receiving injections. L knee okay, finished PT.   Previously:   I saw Chelsea [name] on 08/14/2015, at which time Chelsea [name] reported doing fairly well. Chelsea [name] requested a letter so Chelsea [name] can have help with Chelsea [name] trash can as Chelsea [name] was not able to move Chelsea [name] trash can by herself. Chelsea [name] felt that Chelsea increase in gabapentin was helpful. Chelsea [name] was not driving. Chelsea [name] had ongoing left-sided weakness and  left-sided involuntary movements but stable. Chelsea [name] had gained some weight and was scheduled to see a nutritionist. I suggested Chelsea [name] continue with gabapentin 300 mg 3 times a day.   I saw Chelsea [name] on 03/10/2015, at which time Chelsea [name] reported that Chelsea gabapentin was somewhat helpful. Chelsea [name] had some residual pain. Chelsea [name] was taking 300 mg each night. We gradually increased it to tid.    I first met Chelsea [name] on 11/06/2014 at Chelsea request of Chelsea [name] primary care physician, at which time Chelsea [name] reported a several year history of left-sided pain in Chelsea [name] face as well as hemibody. Symptoms started perhaps a few years after Chelsea [name] stroke in 2007. I suggested symptomatic treatment with gabapentin. 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 I suggested Chelsea [name] titrate gabapentin starting at 100 mg at night.   Chelsea [name] reports left-sided pain in Chelsea [name] face, as well as Chelsea left hemibody. Symptoms started gradually, but Chelsea [name] does not know exactly when they started. It did not happen immediately after Chelsea stroke in 2007, perhaps a few years later. Chelsea [name] has residual left-sided weakness and walks with a cane. Chelsea [name] had therapy and home health therapy and was advised to use a cane. Chelsea [name] describes a warmth sensation on Chelsea left side of Chelsea [name] face and hemibody. This can be a burning pain as well at times. It is not always Chelsea same or constant. Chelsea [name] has a tremor on Chelsea left side and difficulty with fine motor control in Chelsea left. In  addition Chelsea [name] has neck and lower back pain. Chelsea [name] has seen a chiropractor for this. Chelsea [name] had seen [facility] for this and Chelsea [name] knee in Chelsea past. Chelsea [name]'s not able to pinpoint Chelsea degree of pain on Chelsea left side. Sometimes it is not actually a pain but an abnormal temperature sensation. It is not necessarily debilitating but at times quite painful when it has Chelsea burning sensation to it. Chelsea [name] lives alone. Chelsea [name] has 3 grown children. Chelsea [name] grandson is  currently visiting. Chelsea [name] does not smoke. Chelsea [name] is on a baby aspirin. 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 Chelsea [name] had an MRI brain with and without contrast on 11/20/2008: 1.  No evidence of acute ischemia. 2.  Stable non specific supratentorial subcortical white matter changes most likely representing areas of ischemic gliosis due to small vessel disease related to hypertension or diabetes. 3.  Interval near-complete resolution of Chelsea previous right thalamic hemorrhage. 4.  Small focal area of enhancement within Chelsea epicenter of Chelsea hemorrhage, which probably represents contrast pooling.  No evidence of a vascular abnormality however is suggested on Chelsea MRI examination.   Chelsea [name] had a brain MRI with and without contrast and brain MRA without contrast on 04/22/2006:1.  Right thalamic hematoma with surrounding mass effect secondary to vasogenic edema. 2.  Mild mass effect on Chelsea inferior aspect of Chelsea third ventricle. 3.  No imaging evidence of abnormal enhancement or blood vessel ending from periphery of this hemorrhage.   4.  Mild sinusitis changes.   1.  No evidence of occlusion, stenosis, dissections or brain aneurysms noted on Chelsea images provided. 2.  Aneurysms of 5 mm or less may not be seen on MRA examination.     I personally reviewed Chelsea images through Chelsea PACS system and also shared images with Chelsea [name] on Chelsea computer.   Of note, Chelsea [name] is not sure if Chelsea [name] snores. Chelsea [name] denies gasping sensations and has not been told that Chelsea [name] has pauses in Chelsea [name] breathing.  Chelsea [name] Past Medical History Is Significant For: Past Medical History:  Diagnosis Date  . Anxiety   . Cerebrovascular accident [facility])    a. 2007-->residual left sided weakness  . Chest pain   . Depression   . GERD (gastroesophageal reflux disease)   . Headache   . Hx of hysterectomy   . Hyperlipidemia   . Hypertension   . Mild to Moderate Aortic Insufficiency    a. 08/2014 Echo: EF 55-60%, mild conc LVH, no rwma, Gr 1 DD, mild-mod AI, mild  MR.  . Morbid obesity ([place])   . Obesity   . Peptic ulcer disease     Chelsea [name] Past Surgical History Is Significant For: Past Surgical History:  Procedure Laterality Date  . ABDOMINAL HYSTERECTOMY    . TOTAL KNEE ARTHROPLASTY Left 01/05/2016   Procedure: TOTAL KNEE ARTHROPLASTY;  Surgeon: Frederik Pear, MD;  Location: [facility];  Service: Orthopedics;  Laterality: Left;  . TUBAL LIGATION      Chelsea [name] Family History Is Significant For: Family History  Problem Relation Age of Onset  . Hypertension Mother     deceased  . Peripheral vascular disease Mother   . Cirrhosis Father     DeCEASED  . Heart attack Other     grandmother deceased    Chelsea [name] Social History Is Significant For: Social History   Social History  . Marital status: Married    Spouse name: N/A  . Number of children: 3  . 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 Smoking status: Never Smoker  . Smokeless tobacco: Never Used  . Alcohol use No  . Drug use: No  . Sexual activity: Not Currently   Other Topics Concern  . None   Social History Narrative   [name] consumes no caffeine    Chelsea [name] Allergies Are:  Allergies  Allergen Reactions  . Other Swelling    Hair dye, caused eye swelling, multiple times experienced this reaction  :   Chelsea [name] Current Medications Are:  Outpatient Encounter Prescriptions as of 09/09/2016  Medication Sig  . amLODipine-valsartan (EXFORGE) 10-160 MG per tablet Take 1 tablet by mouth daily.  Marland Kitchen aspirin EC 325 MG tablet Take 1 tablet (325 mg total) by mouth 2 (two) times daily.  Marland Kitchen atorvastatin (LIPITOR) 10 MG tablet TAKE 1 TABLET(10 MG) BY MOUTH DAILY  . Calcium-Magnesium-Vitamin D (CALCIUM 500 PO) Take 1 tablet by mouth daily.   Marland Kitchen CARAFATE 1 GM/10ML suspension Take 10 mLs by mouth 4 (four) times daily.  . citalopram (CELEXA) 10 MG tablet Take 10 mg by mouth daily.  . cloNIDine (CATAPRES)  0.3 MG tablet TAKE 1 TABLET BY MOUTH TWICE DAILY  . cyanocobalamin 100 MCG tablet Take 100 mcg by mouth daily.   Marland Kitchen doxazosin (CARDURA) 8 MG tablet Take 0.5 tablets (4 mg total) by mouth at bedtime. ([name] taking differently: Take 8 mg by mouth at bedtime. )  . ezetimibe (ZETIA) 10 MG tablet Take 10 mg by mouth daily.  . furosemide (LASIX) 20 MG tablet Take 20 mg by mouth 2 (two) times daily.   Marland Kitchen gabapentin (NEURONTIN) 300 MG capsule Take 1 capsule (300 mg total) by mouth 3 (three) times daily.  . methocarbamol (ROBAXIN) 500 MG tablet 1 tablet q 6hr prn muscle spasm  . mirabegron ER (MYRBETRIQ) 25 MG TB24 tablet Take 25 mg by mouth daily.  . Multiple Vitamin (MULTIVITAMIN) tablet Take 1 tablet by mouth daily.    Marland Kitchen omeprazole (PRILOSEC) 20 MG capsule Take 20 mg by mouth 2 (two) times daily.    . pantoprazole (PROTONIX) 40 MG tablet Take 40 mg by mouth every evening.  . diphenhydrAMINE (BENADRYL) 25 mg capsule Take 1 capsule (25 mg total) by mouth every 6 (six) hours as needed for itching.  . ranitidine (ZANTAC) 150 MG tablet Take 1 tablet (150 mg total) by mouth 2 (two) times daily.   No facility-administered encounter medications on file as of 09/09/2016.   :  Review of Systems:  Out of a complete 14 point review of systems, all are reviewed and negative with Chelsea exception of these symptoms as listed below:  Review of Systems  Neurological:       No new concerns per [name]. Needs Handicap DMV form filled out.     Objective:  Neurologic Exam  Physical Exam Physical Examination:   Vitals:   09/09/16 0810  BP: 128/82  Pulse: 72  Resp: 16   General Examination: Chelsea [name] is a very pleasant 65 y.o. female in no acute distress. Chelsea [name] appears well-developed and well-nourished and well groomed. Chelsea [name] wears a head scarf. Chelsea [name] is in good spirits today.   HEENT: Normocephalic, atraumatic, pupils are equal, round and reactive to light and accommodation. Funduscopic exam is normal with sharp  disc margins noted. Chelsea [name] has mild b/l cataracts. Chelsea [name] wears corrective eyeglasses. Extraocular tracking is good without limitation to  gaze excursion or nystagmus noted. Normal smooth pursuit is noted. Hearing is grossly intact. Face is asymmetric with mild left lower facial weakness noted, unchanged. Speech is clear. Chelsea [name] has no significant dysarthria. Chelsea [name] has decreased sensation to all modalities on Chelsea left face. Oropharynx exam reveals: moderate mouth dryness, adequate dental hygiene and mild airway crowding.   Chest: Clear to auscultation without wheezing, rhonchi or crackles noted.  Heart: S1+S2+0, regular, no murmur noted today.    Abdomen: Soft, non-tender and non-distended with normal bowel sounds appreciated on auscultation.  Extremities: There is trace pitting edema in Chelsea distal lower extremities bilaterally. Pedal pulses are intact.  Skin: Warm and dry without trophic changes noted. There are no varicose veins.  Musculoskeletal: exam reveals no obvious joint deformities, tenderness or joint swelling or erythema, other than unremarkable scar over Chelsea left knee, mild swelling of Chelsea left knee. Chelsea [name] has some LBP.   Neurologically:  Mental status: Chelsea [name] is awake, alert and oriented in all 4 spheres. Chelsea [name] immediate and remote memory, attention, language skills and fund of knowledge are appropriate. There is no evidence of aphasia, agnosia, apraxia or anomia. Speech is clear with normal prosody and enunciation. Thought process is linear. Mood is normal and affect is normal.  Cranial nerves II - XII are as described above under HEENT exam. In addition: shoulder shrug is normal with equal shoulder height noted. Motor exam: Chelsea [name] has an overall fairly thin bulk. Strength is normal on Chelsea right. Chelsea [name] has 4 out of 5 weakness on Chelsea left, stable. Chelsea [name] has no involuntary movements at rest in Chelsea left upper extremity, but has a coarse intention tremor on Chelsea left, no significant postural tremor in either  upper extremity. Chelsea [name] has difficulty with fine motor skills and has involuntary movements in Chelsea left upper extremity in particular with action. Sensory exam shows decreased all modalities on Chelsea left hemibody. Reflexes are mildly hyperactive on Chelsea left and normal on Chelsea right. Chelsea [name] has mild difficulty standing and walks with a slight limp on Chelsea left, has a 4 pronged cane. Chelsea [name] also reports soreness in Chelsea [name] left knee when standing and walking. Tandem walk is not possible. Romberg is not possible.  Assessment and Plan:    In summary, Chelsea [name] is a very pleasant 65 year old female with an underlying complex medical history of hypertension, reflux disease, hyperactive bladder, arthritis, hyperlipidemia, depression, and stroke in 2007 with residual left-sided weakness, aortic insufficiency (seen by Dr. Aundra Dubin in cardiology), who presents for follow-up consultation of Chelsea [name] thalamic pain syndrome, in Chelsea context of thalamic stroke with residual weakness and pain symptoms of left-sided pain, including Chelsea [name] face, and left hemibody. Symptomatic treatment with gabapentin 300 mg 3 times a day has been fairly successful. Chelsea [name] exam is stable with Chelsea exception. I had detected an irregular heartbeat last time, but thankfully things checked out with Chelsea [name] cardiology FU appointment in May 2017. Chelsea [name] is on baby aspirin and is advised to continue with this. Should Chelsea [name] have any sudden neurological symptoms Chelsea [name] knows to go to Chelsea emergency room. Chelsea [name] is advised to continue with gabapentin 300 mg 3 times a day and I renewed Chelsea [name] prescription for 90 days with refills. I would like to see Chelsea [name] back in 6 months, sooner if Chelsea need arises.  I answered all Chelsea [name] questions today and Chelsea [name] was in agreement.  I spent 25 minutes in total face-to-face time with Chelsea [name], more than 50% of which was spent in counseling and coordination  of care, reviewing test results, reviewing medication and discussing or reviewing Chelsea diagnosis of thalamic stroke  and residual weakness secondary to thalamic stroke and thalamic pain syndrome, as well as a fib, its prognosis and treatment options.

## 2016-09-10 ENCOUNTER — Ambulatory Visit
Admission: RE | Admit: 2016-09-10 | Discharge: 2016-09-10 | Disposition: A | Discharge status: Home / Self Care | Payer: Medicare Other | Source: Ambulatory Visit | Attending: Gastroenterology | Admitting: Gastroenterology

## 2016-09-10 DIAGNOSIS — R1084 Generalized abdominal pain: Secondary | ICD-10-CM

## 2016-09-10 DIAGNOSIS — R10816 Epigastric abdominal tenderness: Secondary | ICD-10-CM

## 2016-09-10 DIAGNOSIS — R1030 Lower abdominal pain, unspecified: Secondary | ICD-10-CM

## 2016-09-10 DIAGNOSIS — K8020 Calculus of gallbladder without cholecystitis without obstruction: Secondary | ICD-10-CM | POA: Diagnosis not present

## 2016-09-22 DIAGNOSIS — R1013 Epigastric pain: Secondary | ICD-10-CM | POA: Diagnosis not present

## 2016-09-22 DIAGNOSIS — K228 Other specified diseases of esophagus: Secondary | ICD-10-CM | POA: Diagnosis not present

## 2016-09-22 DIAGNOSIS — K259 Gastric ulcer, unspecified as acute or chronic, without hemorrhage or perforation: Secondary | ICD-10-CM | POA: Diagnosis not present

## 2016-09-22 DIAGNOSIS — R12 Heartburn: Secondary | ICD-10-CM | POA: Diagnosis not present

## 2016-09-22 DIAGNOSIS — K210 Gastro-esophageal reflux disease with esophagitis: Secondary | ICD-10-CM | POA: Diagnosis not present

## 2016-09-22 DIAGNOSIS — K219 Gastro-esophageal reflux disease without esophagitis: Secondary | ICD-10-CM | POA: Diagnosis not present

## 2016-09-22 DIAGNOSIS — K3189 Other diseases of stomach and duodenum: Secondary | ICD-10-CM | POA: Diagnosis not present

## 2016-09-22 DIAGNOSIS — K2930 Chronic superficial gastritis without bleeding: Secondary | ICD-10-CM | POA: Diagnosis not present

## 2016-09-29 DIAGNOSIS — K210 Gastro-esophageal reflux disease with esophagitis: Secondary | ICD-10-CM | POA: Diagnosis not present

## 2016-09-29 DIAGNOSIS — K2930 Chronic superficial gastritis without bleeding: Secondary | ICD-10-CM | POA: Diagnosis not present

## 2016-10-05 ENCOUNTER — Encounter: Payer: Medicare Other | Attending: Family Medicine | Admitting: Dietician

## 2016-10-05 DIAGNOSIS — E669 Obesity, unspecified: Secondary | ICD-10-CM | POA: Insufficient documentation

## 2016-10-05 DIAGNOSIS — Z6830 Body mass index (BMI) 30.0-30.9, adult: Secondary | ICD-10-CM

## 2016-10-05 DIAGNOSIS — Z713 Dietary counseling and surveillance: Secondary | ICD-10-CM | POA: Insufficient documentation

## 2016-10-05 NOTE — Patient Instructions (Addendum)
For yogurt try Dannon Light and Fit or Oikos Triple Zero (carbs less than 15 grams). Have snacks only if you are hungry. If you have to have a half bagel, have with protein (egg, peanut butter).  If you want to have ice cream, go out to get a cone.   Try sugar free jello. Continue doing exercises each day.  Have vegetables (spinach, green beans, broccoli, cauliflower) with lunch and dinner. Aim to have 30-45 grams of carbs per meal and up to 15 grams per snack.

## 2016-10-05 NOTE — Progress Notes (Signed)
  Medical Nutrition Therapy:  Appt start time: 1025 end time:  1105   Assessment:  Primary concerns today: Chelsea [name] is here today for a follow up to try to lose weight with a 1 lbs weight loss in the past 3 months. Just found out that she has a stomach ulcer. Taking Protonix.  Splurges on chips every once in a while but for the most part avoid sweets and "junk foods".  Weight loss goal is 165 lbs.  Preferred Learning Style:   No preference indicated   Learning Readiness:   Ready  MEDICATIONS: see list   DIETARY INTAKE:  Usual eating pattern includes 3  meals and 3 snacks per day.  Avoided foods include mushrooms, okra, black beans    24-hr recall:  B ( AM): 2 scrambled eggs with [place] bacon and applesauce Snk ( AM): fruit or yogurt L (12 PM): baked chicken with brown rice and green beans or fiber one if she is out Snk ( PM): grapes or other fruit  D ( PM): 2 hot dogs, pinto beans, and applesauce or fish, hush puppies, and coleslaw or bologna sandwich and applesauce Snk ( PM): yogurt Beverages: water, Ocean Spray 5 calorie, diet green tea, 1/2 bottle Powerade Zero, coffee sometimes with stevia  Usual physical activity: exercises in her bed  Estimated energy needs: 1600 calories 180 g carbohydrates 120 g protein 44 g fat  Progress Towards Goal(s):  In progress.   Nutritional Diagnosis:  [place]-3.3 Overweight/obesity As related to hx of energy dense food choices and sugary beverages.  As evidenced by BMI of 35.9.    Intervention:  Nutrition counseling provided. Plan: For yogurt try Dannon Light and Fit or Oikos Triple Zero (carbs less than 15 grams). Have snacks only if you are hungry. If you have to have a half bagel, have with protein (egg, peanut butter).  If you want to have ice cream, go out to get a cone.   Try sugar free jello. Continue doing exercises each day.  Have vegetables (spinach, green beans, broccoli, cauliflower) with lunch and dinner. Aim to have 30-45  grams of carbs per meal and up to 15 grams per snack.   Teaching Method Utilized:  Visual Auditory Hands on  Handouts given during visit include:  Meal card  Barriers to learning/adherence to lifestyle change: limited income, limited mobility  Demonstrated degree of understanding via:  Teach Back   Monitoring/Evaluation:  Dietary intake, exercise, and body weight in 3 month(s).

## 2016-10-27 ENCOUNTER — Other Ambulatory Visit: Payer: Self-pay | Admitting: Family Medicine

## 2016-10-27 DIAGNOSIS — Z1231 Encounter for screening mammogram for malignant neoplasm of breast: Secondary | ICD-10-CM

## 2016-10-27 DIAGNOSIS — I10 Essential (primary) hypertension: Secondary | ICD-10-CM | POA: Diagnosis not present

## 2016-10-27 DIAGNOSIS — I69952 Hemiplegia and hemiparesis following unspecified cerebrovascular disease affecting left dominant side: Secondary | ICD-10-CM | POA: Diagnosis not present

## 2016-10-28 DIAGNOSIS — R10816 Epigastric abdominal tenderness: Secondary | ICD-10-CM | POA: Diagnosis not present

## 2016-10-28 DIAGNOSIS — K219 Gastro-esophageal reflux disease without esophagitis: Secondary | ICD-10-CM | POA: Diagnosis not present

## 2016-11-18 DIAGNOSIS — R10816 Epigastric abdominal tenderness: Secondary | ICD-10-CM | POA: Diagnosis not present

## 2016-11-19 ENCOUNTER — Ambulatory Visit: Payer: Medicare Other

## 2016-12-02 DIAGNOSIS — R899 Unspecified abnormal finding in specimens from other organs, systems and tissues: Secondary | ICD-10-CM | POA: Diagnosis not present

## 2016-12-07 ENCOUNTER — Ambulatory Visit
Admission: RE | Admit: 2016-12-07 | Discharge: 2016-12-07 | Disposition: A | Discharge status: Home / Self Care | Payer: Medicare Other | Source: Ambulatory Visit | Attending: Family Medicine | Admitting: Family Medicine

## 2016-12-07 DIAGNOSIS — Z1231 Encounter for screening mammogram for malignant neoplasm of breast: Secondary | ICD-10-CM

## 2016-12-08 DIAGNOSIS — K219 Gastro-esophageal reflux disease without esophagitis: Secondary | ICD-10-CM | POA: Diagnosis not present

## 2016-12-08 DIAGNOSIS — I10 Essential (primary) hypertension: Secondary | ICD-10-CM | POA: Diagnosis not present

## 2016-12-08 DIAGNOSIS — E785 Hyperlipidemia, unspecified: Secondary | ICD-10-CM | POA: Diagnosis not present

## 2016-12-23 DIAGNOSIS — I10 Essential (primary) hypertension: Secondary | ICD-10-CM | POA: Diagnosis not present

## 2017-01-03 ENCOUNTER — Ambulatory Visit: Payer: Medicare Other | Admitting: Dietician

## 2017-01-04 ENCOUNTER — Encounter: Payer: Medicare Other | Attending: Family Medicine | Admitting: Registered"

## 2017-01-04 DIAGNOSIS — Z713 Dietary counseling and surveillance: Secondary | ICD-10-CM | POA: Insufficient documentation

## 2017-01-04 DIAGNOSIS — E6609 Other obesity due to excess calories: Secondary | ICD-10-CM

## 2017-01-04 DIAGNOSIS — E669 Obesity, unspecified: Secondary | ICD-10-CM | POA: Insufficient documentation

## 2017-01-04 DIAGNOSIS — Z6830 Body mass index (BMI) 30.0-30.9, adult: Secondary | ICD-10-CM

## 2017-01-04 NOTE — Progress Notes (Signed)
  Medical Nutrition Therapy:  Appt start time: 0940 end time:  1010   Assessment:  Primary concerns today: Chelsea [name] is here today for a follow up to try to lose weight. Her weight loss seems to have plateaud, 193 lbs, 2 lbs up from November. (she was wearing winter coat when getting weighed. Left side is affected by prior stroke and did not ask her to remove coat). Chelsea [name] states that she weighed 187 at last doctor's visit.  Recently bought brown rice. Commercials for fast food makes her want to eat these foods. Takes Kcl which helps her energy level. She reports getting plenty of sleep. Last night fell asleep with the TV on.  Weight loss goal is 175 lbs.  Preferred Learning Style:   No preference indicated   Learning Readiness:   Ready  MEDICATIONS: see list   DIETARY INTAKE:  Usual eating pattern includes 3  meals and 3 snacks per day.  Avoided foods include mushrooms, okra, black beans    24-hr recall:  B ( AM): 1 boiled egg, grits, Chelsea [name] bacon Snk ( AM): wheat thins crackers L (12 PM): PB&J  Snk ( PM): ice cream sandwich D ( PM): 1/2 Chelsea [name] sandwich with lettuce, tomato Snk ( PM): none Beverages: started water with lemon, Ocean Spray 5 calorie, diet green tea, 1/2 bottle Powerade Zero, coffee sometimes with stevia  Usual physical activity: exercises in her bed  Estimated energy needs: 1600 calories 180 g carbohydrates 120 g protein 44 g fat  Progress Towards Goal(s):  In progress.   Nutritional Diagnosis:  Chelsea [name]-3.3 Overweight/obesity As related to hx of energy dense food choices and sugary beverages.  As evidenced by BMI of 35.9.    Intervention:  Nutrition counseling provided. Plan: For yogurt try fat free [place] yogurt with carbs less than 15 grams). Popcorn is fine for snacks pair with protein. Skinny Pop is a good brand. Continue doing exercises each day.  Have vegetables (spinach, green beans, broccoli, cauliflower) with lunch and dinner. Aim to have 30-45 grams  of carbs per meal and up to 15 grams per snack.   Teaching Method Utilized:  Visual Auditory Hands on  Handouts given during visit include:    Barriers to learning/adherence to lifestyle change: limited income, limited mobility  Demonstrated degree of understanding via:  Teach Back   Monitoring/Evaluation:  Dietary intake, exercise, and body weight in 3 month(s).

## 2017-01-04 NOTE — Patient Instructions (Signed)
Plan: For yogurt try fat free [place] yogurt with carbs less than 15 grams). Popcorn is fine for snacks pair with protein. Skinny Pop is a good brand. Continue doing exercises each day.  Have vegetables (spinach, green beans, broccoli, cauliflower) with lunch and dinner. Aim to have 30-45 grams of carbs per meal and up to 15 grams per snack.

## 2017-02-03 DIAGNOSIS — I10 Essential (primary) hypertension: Secondary | ICD-10-CM | POA: Diagnosis not present

## 2017-02-03 DIAGNOSIS — E060 Acute thyroiditis: Secondary | ICD-10-CM | POA: Diagnosis not present

## 2017-03-07 DIAGNOSIS — M549 Dorsalgia, unspecified: Secondary | ICD-10-CM | POA: Diagnosis not present

## 2017-03-10 ENCOUNTER — Ambulatory Visit (INDEPENDENT_AMBULATORY_CARE_PROVIDER_SITE_OTHER): Payer: Medicare Other | Admitting: Adult Health

## 2017-03-10 ENCOUNTER — Encounter: Payer: Self-pay | Admitting: Adult Health

## 2017-03-10 ENCOUNTER — Encounter (INDEPENDENT_AMBULATORY_CARE_PROVIDER_SITE_OTHER): Payer: Self-pay

## 2017-03-10 VITALS — BP 156/92 | HR 56 | Ht 66.0 in | Wt 198.2 lb

## 2017-03-10 DIAGNOSIS — I619 Nontraumatic intracerebral hemorrhage, unspecified: Secondary | ICD-10-CM

## 2017-03-10 DIAGNOSIS — G890 Central pain syndrome: Secondary | ICD-10-CM | POA: Diagnosis not present

## 2017-03-10 MED ORDER — GABAPENTIN 400 MG PO CAPS: 400.0000 mg | ORAL_CAPSULE | Freq: Three times a day (TID) | ORAL | 11 refills | Status: DC

## 2017-03-10 NOTE — Progress Notes (Addendum)
PATIENT: Chelsea [name] DOB: [birth year]  REASON FOR VISIT: follow up- stroke, thalamic pain syndrome HISTORY FROM: patient  HISTORY OF PRESENT ILLNESS: Chelsea [name] is a 66 year old female with a history of stroke. She returns today for follow-up. Since her stroke she's had thalamic pain primarily on the left side of the body. She reports that gabapentin is beneficial but there are times that she has to double her dose due to her discomfort. She is currently taking gabapentin 300 mg 3 times a day. She is following with her primary care. She states that her blood pressure has been elevated and her PCP suggested that she rest more often. She has a follow-up appointment at the end of this month. She remains on Lipitor for her cholesterol. She denies any new neurological symptoms. She returns today for an evaluation.  HISTORY copied from Dr. 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 She is unaccompanied today. I last saw her on 02/11/2016, at which time she reported that gabapentin was helping. She did have residual pain on the left side. She was noted to have an irregular pulse on my exam and denied any chest pain or shortness of breath, but reported that she herself had noted irregular heartbeat from time to time. She had not seen cardiology since 2014. She had an echocardiogram and EKG for surgical clearance recently.she underwent left total knee arthroplasty on 01/05/2016, under Dr. Mayer Camel. She was coming along quite well after that, still was in physical therapy. Unfortunately, her pain medication had caused severe  nausea and vomiting. She stopped taking narcotic pain medication in February 2017. I suggested she continue with gabapentin 300 mg 3 times a day.  Today, 09/09/2016: She reports doing okay as far as the pain on the L side. She was seen by Dr. Aundra Dubin in cardiology on 04/20/2016 and I reviewed the office note. She has been feeling okay as far as palpitations. She has LBP, sees Dr. Jacelyn Grip and has been receiving injections. L knee okay, finished PT.   Previously:  I saw her on 08/14/2015, at which time she reported doing fairly well. She requested a letter so she can have help with her trash can as she was not able to move her trash can by herself. She felt that the increase in gabapentin was helpful. She was not driving. She had ongoing left-sided weakness and left-sided involuntary movements but stable. She had gained some weight and was scheduled to see a nutritionist. I suggested she continue with gabapentin 300 mg 3 times a day.  I saw her on 03/10/2015, at which time she reported that the gabapentin was somewhat helpful. She had some residual pain. She was taking 300 mg each night. We gradually increased it to tid.   I first met her on 11/06/2014 at the request of her primary care physician, at which time the patient reported a several year history of left-sided pain in her face as well as hemibody. Symptoms started perhaps a few years after her stroke in 2007. I suggested symptomatic treatment with gabapentin. 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 I suggested she  titrate gabapentin starting at 100 mg at night.  She reports left-sided pain in her face, as well as the left hemibody. Symptoms started gradually, but she does not know exactly when they started. It did not happen immediately after the stroke in 2007, perhaps a few years later. She has residual left-sided weakness and walks with a  cane. She had therapy and home health therapy and was advised to use a cane. She describes a warmth sensation on the left side of her face and hemibody. This can be a burning pain as well at times. It is not always the same or constant. She has a tremor on the left side and difficulty with fine motor control in the left. In addition she has neck and lower back pain. She has seen a chiropractor for this. She had seen [facility] for this and her knee in the past. She's not able to pinpoint the degree of pain on the left side. Sometimes it is not actually a pain but an abnormal temperature sensation. It is not necessarily debilitating but at times quite painful when it has the burning sensation to it. She lives alone. She has 3 grown children. Her grandson is currently visiting. She does not smoke. She is on a baby aspirin. Of note, she had a right thalamic hemorrhagic stroke. She had an MRI brain with and without contrast on 11/20/2008: 1.No evidence of acute ischemia. 2.Stable non specific supratentorial subcortical white matter changes most likely representing areas of ischemic gliosis due to small vessel disease related to hypertension or diabetes. 3.Interval near-complete resolution of the previous right thalamic hemorrhage. 4.Small focal area of enhancement within the epicenter of the hemorrhage, which probably represents contrast pooling.No evidence of a vascular abnormality however is suggested on the MRI examination.  She had a brain MRI with and without contrast and brain MRA without contrast on 04/22/2006:1.Right thalamic hematoma with surrounding mass effect secondary to vasogenic edema. 2.Mild mass effect on the inferior aspect of the third ventricle. 3.No imaging evidence of abnormal enhancement or blood vessel ending from periphery of this hemorrhage.  4.Mild sinusitis changes.  1.No evidence of occlusion, stenosis, dissections or brain aneurysms noted on the  images provided. 2.Aneurysms of 5 mm or less may not be seen on MRA examination.   I personally reviewed the images through the PACS system and also shared images with her on the computer.  Of note, she is not sure if she snores. She denies gasping sensations and has not been told that she has pauses in her breathing.  REVIEW OF SYSTEMS: Out of a complete 14 system review of symptoms, the patient complains only of the following symptoms, and all other reviewed systems are negative.  See history of present illness  ALLERGIES: Allergies  Allergen Reactions  . Other Swelling    Hair dye, caused eye swelling, multiple times experienced this reaction    HOME MEDICATIONS: Outpatient Medications Prior to Visit  Medication Sig Dispense Refill  . amLODipine-valsartan (EXFORGE) 10-160 MG per tablet Take 1 tablet by mouth daily. 90 tablet 3  . aspirin EC 325 MG tablet Take 1 tablet (325 mg total) by mouth 2 (two) times daily. 30 tablet 0  . atorvastatin (LIPITOR) 10 MG tablet TAKE 1 TABLET(10 MG) BY MOUTH DAILY 90 tablet 3  . Calcium-Magnesium-Vitamin D (CALCIUM 500 PO) Take 1 tablet by mouth daily.     Marland Kitchen CARAFATE 1 GM/10ML suspension Take 10 mLs by mouth 4 (four) times daily.    Marland Kitchen  citalopram (CELEXA) 10 MG tablet Take 10 mg by mouth daily.    . cloNIDine (CATAPRES) 0.3 MG tablet TAKE 1 TABLET BY MOUTH TWICE DAILY 180 tablet 3  . cyanocobalamin 100 MCG tablet Take 100 mcg by mouth daily.     Marland Kitchen doxazosin (CARDURA) 8 MG tablet Take 0.5 tablets (4 mg total) by mouth at bedtime. (Patient taking differently: Take 8 mg by mouth at bedtime. ) 30 tablet 3  . ezetimibe (ZETIA) 10 MG tablet Take 10 mg by mouth daily.    . furosemide (LASIX) 20 MG tablet Take 20 mg by mouth 2 (two) times daily.     Marland Kitchen gabapentin (NEURONTIN) 300 MG capsule Take 1 capsule (300 mg total) by mouth 3 (three) times daily. 270 capsule 3  . methocarbamol (ROBAXIN) 500 MG tablet 1 tablet q 6hr prn muscle spasm 60 tablet 1  .  mirabegron ER (MYRBETRIQ) 25 MG TB24 tablet Take 25 mg by mouth daily.    . Multiple Vitamin (MULTIVITAMIN) tablet Take 1 tablet by mouth daily.      . pantoprazole (PROTONIX) 40 MG tablet Take 40 mg by mouth every evening.    . diphenhydrAMINE (BENADRYL) 25 mg capsule Take 1 capsule (25 mg total) by mouth every 6 (six) hours as needed for itching. 30 capsule 0  . omeprazole (PRILOSEC) 20 MG capsule Take 20 mg by mouth 2 (two) times daily.      . ranitidine (ZANTAC) 150 MG tablet Take 1 tablet (150 mg total) by mouth 2 (two) times daily. 14 tablet 0   No facility-administered medications prior to visit.     PAST MEDICAL HISTORY: Past Medical History:  Diagnosis Date  . Anxiety   . Cerebrovascular accident [facility])    a. 2007-->residual left sided weakness  . Chest pain   . Depression   . GERD (gastroesophageal reflux disease)   . Headache   . Hx of hysterectomy   . Hyperlipidemia   . Hypertension   . Mild to Moderate Aortic Insufficiency    a. 08/2014 Echo: EF 55-60%, mild conc LVH, no rwma, Gr 1 DD, mild-mod AI, mild MR.  . Morbid obesity ([place])   . Obesity   . Peptic ulcer disease     PAST SURGICAL HISTORY: Past Surgical History:  Procedure Laterality Date  . ABDOMINAL HYSTERECTOMY    . TOTAL KNEE ARTHROPLASTY Left 01/05/2016   Procedure: TOTAL KNEE ARTHROPLASTY;  Surgeon: Frederik Pear, MD;  Location: [place];  Service: Orthopedics;  Laterality: Left;  . TUBAL LIGATION      FAMILY HISTORY: Family History  Problem Relation Age of Onset  . Hypertension Mother     deceased  . Peripheral vascular disease Mother   . Cirrhosis Father     DeCEASED  . Heart attack Other     grandmother deceased    SOCIAL HISTORY: Social History   Social History  . Marital status: Married    Spouse name: N/A  . Number of children: 3  . 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  Smoking status: Never Smoker  . Smokeless tobacco: Never Used  . Alcohol use No  . Drug use: No  . Sexual activity: Not Currently   Other Topics Concern  . Not on file   Social History Narrative   Patient consumes no caffeine  PHYSICAL EXAM  Vitals:   03/10/17 1000  BP: (!) 156/92  Pulse: (!) 56  Weight: 198 lb 3.2 oz (89.9 kg)  Height: _0  (1.676 m)   Body mass index is 31.99 kg/m.  Generalized: Well developed, in no acute distress   Neurological examination  Mentation: Alert oriented to time, place, history taking. Follows all commands speech and language fluent Cranial nerve II-XII: Pupils were equal round reactive to light. Extraocular movements were full, visual field were full on confrontational test. Facial sensation and strength were normal. Uvula tongue midline. Head turning and shoulder shrug  were normal and symmetric. Motor: The motor testing reveals 5 over 5 strength of all 4 extremities. Good symmetric motor tone is noted throughout.  Sensory: Sensory testing is intact to soft touch on all 4 extremities. No evidence of extinction is noted.  Coordination: Cerebellar testing reveals good finger-nose-finger and heel-to-shin bilaterally.  Gait and station: Gait is normal. Tandem gait is normal. Romberg is negative. No drift is seen.  Reflexes: Deep tendon reflexes are symmetric and normal bilaterally.   DIAGNOSTIC DATA (LABS, IMAGING, TESTING) - I reviewed patient records, labs, notes, testing and imaging myself where available.  Lab Results  Component Value Date   WBC 4.0 08/09/2016   HGB 14.6 08/09/2016   HCT 43.0 08/09/2016   MCV 89.8 08/09/2016   PLT 237 08/09/2016      Component Value Date/Time   NA 145 08/09/2016 1603   K 3.5 08/09/2016 1603   CL 103 08/09/2016 1603   CO2 28 04/20/2016 1409   GLUCOSE 89 08/09/2016 1603   BUN 14 08/09/2016 1603   CREATININE 1.20 (H) 08/09/2016 1603   CREATININE 1.11 (H) 04/20/2016 1409   CALCIUM 9.4  04/20/2016 1409   PROT 7.2 06/13/2013 1045   ALBUMIN 3.6 06/13/2013 1045   AST 19 06/13/2013 1045   ALT 17 06/13/2013 1045   ALKPHOS 93 06/13/2013 1045   BILITOT 0.3 06/13/2013 1045   GFRNONAA 42 (L) 01/06/2016 0312   GFRAA 49 (L) 01/06/2016 0312   Lab Results  Component Value Date   CHOL 134 04/20/2016   HDL 54 04/20/2016   LDLCALC 60 04/20/2016   TRIG 99 04/20/2016   CHOLHDL 2.5 04/20/2016       ASSESSMENT AND PLAN 66 y.o. year old female  has a past medical history of Anxiety; Cerebrovascular accident ([place]); Chest pain; Depression; GERD (gastroesophageal reflux disease); Headache; hysterectomy; Hyperlipidemia; Hypertension; Mild to Moderate Aortic Insufficiency; Morbid obesity ([place]); Obesity; and Peptic ulcer disease. here with:  1. Thalamic pain syndrome   2. History of stroke   The patient states that gabapentin is beneficial for her pain but there are times that she has to double her dose. For that reason we will increase gabapentin to 400 mg 3 times a day. I have reviewed the side effects of gabapentin with the patient and provided her with a handout. The patient should maintain strict control of her blood pressure with goal less than 130/90, cholesterol LDL less than 70 and hemoglobin A1c less than 6.5%. Patient is advised that if she has any strokelike symptoms she should go to the emergency room. She will follow-up in 6 months with Dr. Carmelia Bake, MSN, NP-C 03/10/2017, 10:05 AM [facility] [address] [place], [place] [address] [phone]  I reviewed the above note and documentation by the Nurse Practitioner and agree with the history, physical exam, assessment and plan as outlined above. I was  immediately available for face-to-face consultation. Star Age, MD, PhD [facility])

## 2017-03-10 NOTE — Patient Instructions (Addendum)
Increase Gabapentin to 400 mg three times a day Maintain strict BP <130/90 Cholesterol LDL < 70 HgbA1c <6.5 % If your symptoms worsen or you develop new symptoms please let us know.   Gabapentin capsules or tablets What is this medicine? GABAPENTIN (GA ba pen tin) is used to control partial seizures in adults with epilepsy. It is also used to treat certain types of nerve pain. This medicine may be used for other purposes; ask your health care provider or pharmacist if you have questions. COMMON BRAND NAME(S): Active-PAC with Gabapentin, Gabarone, Neurontin What should I tell my health care provider before I take this medicine? They need to know if you have any of these conditions: -kidney disease -suicidal thoughts, plans, or attempt; a previous suicide attempt by you or a family member -an unusual or allergic reaction to gabapentin, other medicines, foods, dyes, or preservatives -pregnant or trying to get pregnant -breast-feeding How should I use this medicine? Take this medicine by mouth with a glass of water. Follow the directions on the prescription label. You can take it with or without food. If it upsets your stomach, take it with food.Take your medicine at regular intervals. Do not take it more often than directed. Do not stop taking except on your doctor's advice. If you are directed to break the 600 or 800 mg tablets in half as part of your dose, the extra half tablet should be used for the next dose. If you have not used the extra half tablet within 28 days, it should be thrown away. A special MedGuide will be given to you by the pharmacist with each prescription and refill. Be sure to read this information carefully each time. Talk to your pediatrician regarding the use of this medicine in children. Special care may be needed. Overdosage: If you think you have taken too much of this medicine contact a poison control center or emergency room at once. NOTE: This medicine is only for  you. Do not share this medicine with others. What if I miss a dose? If you miss a dose, take it as soon as you can. If it is almost time for your next dose, take only that dose. Do not take double or extra doses. What may interact with this medicine? Do not take this medicine with any of the following medications: -other gabapentin products This medicine may also interact with the following medications: -alcohol -antacids -antihistamines for allergy, cough and cold -certain medicines for anxiety or sleep -certain medicines for depression or psychotic disturbances -homatropine; hydrocodone -naproxen -narcotic medicines (opiates) for pain -phenothiazines like chlorpromazine, mesoridazine, prochlorperazine, thioridazine This list may not describe all possible interactions. Give your health care provider a list of all the medicines, herbs, non-prescription drugs, or dietary supplements you use. Also tell them if you smoke, drink alcohol, or use illegal drugs. Some items may interact with your medicine. What should I watch for while using this medicine? Visit your doctor or health care professional for regular checks on your progress. You may want to keep a record at home of how you feel your condition is responding to treatment. You may want to share this information with your doctor or health care professional at each visit. You should contact your doctor or health care professional if your seizures get worse or if you have any new types of seizures. Do not stop taking this medicine or any of your seizure medicines unless instructed by your doctor or health care professional. Stopping your medicine suddenly can  increase your seizures or their severity. Wear a medical identification bracelet or chain if you are taking this medicine for seizures, and carry a card that lists all your medications. You may get drowsy, dizzy, or have blurred vision. Do not drive, use machinery, or do anything that needs  mental alertness until you know how this medicine affects you. To reduce dizzy or fainting spells, do not sit or stand up quickly, especially if you are an older patient. Alcohol can increase drowsiness and dizziness. Avoid alcoholic drinks. Your mouth may get dry. Chewing sugarless gum or sucking hard candy, and drinking plenty of water will help. The use of this medicine may increase the chance of suicidal thoughts or actions. Pay special attention to how you are responding while on this medicine. Any worsening of mood, or thoughts of suicide or dying should be reported to your health care professional right away. Women who become pregnant while using this medicine may enroll in the [place] Pregnancy Registry by calling [phone]. This registry collects information about the safety of antiepileptic drug use during pregnancy. What side effects may I notice from receiving this medicine? Side effects that you should report to your doctor or health care professional as soon as possible: -allergic reactions like skin rash, itching or hives, swelling of the face, lips, or tongue -worsening of mood, thoughts or actions of suicide or dying Side effects that usually do not require medical attention (report to your doctor or health care professional if they continue or are bothersome): -constipation -difficulty walking or controlling muscle movements -dizziness -nausea -slurred speech -tiredness -tremors -weight gain This list may not describe all possible side effects. Call your doctor for medical advice about side effects. You may report side effects to FDA at 1-800-FDA-1088. Where should I keep my medicine? Keep out of reach of children. This medicine may cause accidental overdose and death if it taken by other adults, children, or pets. Mix any unused medicine with a substance like cat litter or coffee grounds. Then throw the medicine away in a sealed container like a  sealed bag or a coffee can with a lid. Do not use the medicine after the expiration date. Store at room temperature between 15 and 30 degrees C (59 and 86 degrees F). NOTE: This sheet is a summary. It may not cover all possible information. If you have questions about this medicine, talk to your doctor, pharmacist, or health care provider.  2018 Elsevier/Gold Standard (2014-01-11 15:26:50)

## 2017-03-11 IMAGING — US US ABDOMEN COMPLETE
1 series · 14 of 25 positions shown · non-contrast
Comparison: None.

CLINICAL DATA: Epigastric pain with palpation. Nausea for 2 months.

EXAM:
ABDOMEN ULTRASOUND COMPLETE

[Series 1: us abdomen complete · 0.20mm/px · 14 of 74 slices shown]
[im 1/74]
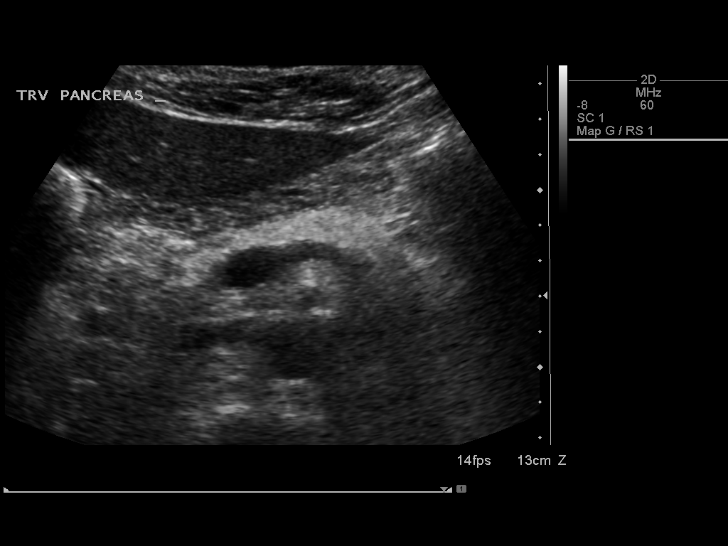
[im 7/74]
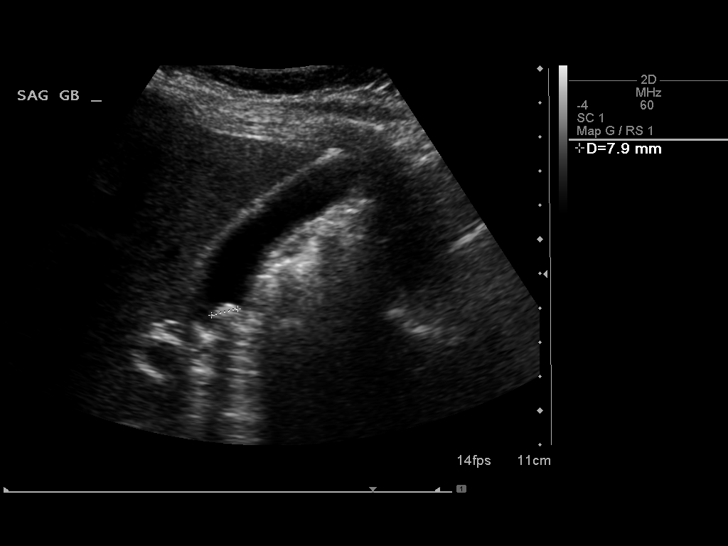
[im 13/74]
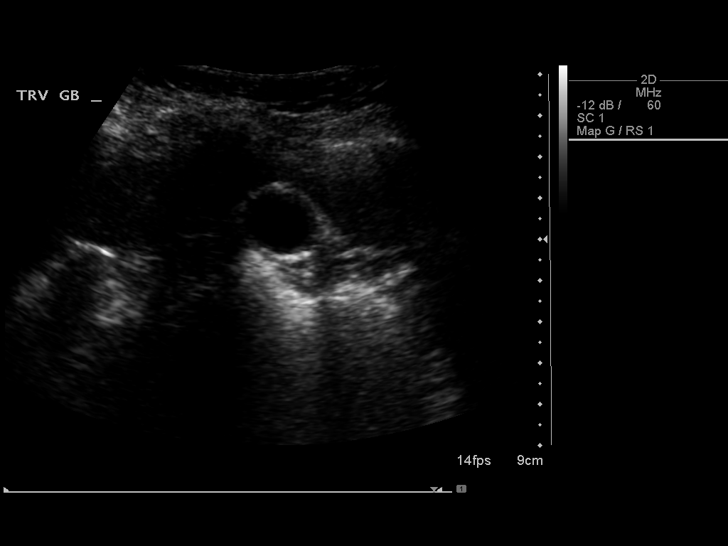
[im 19/74]
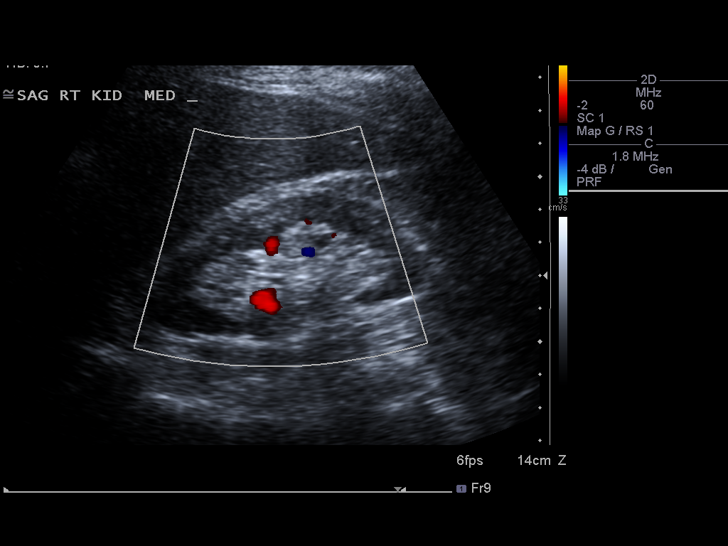
[im 25/74]
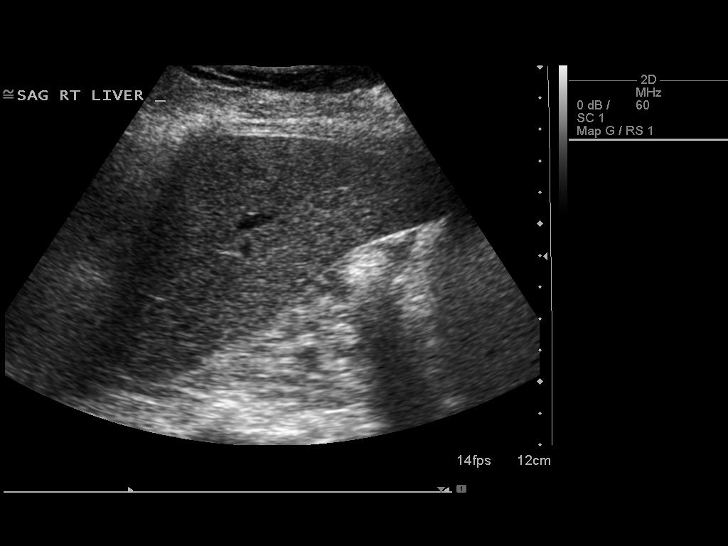
[im 28/74]
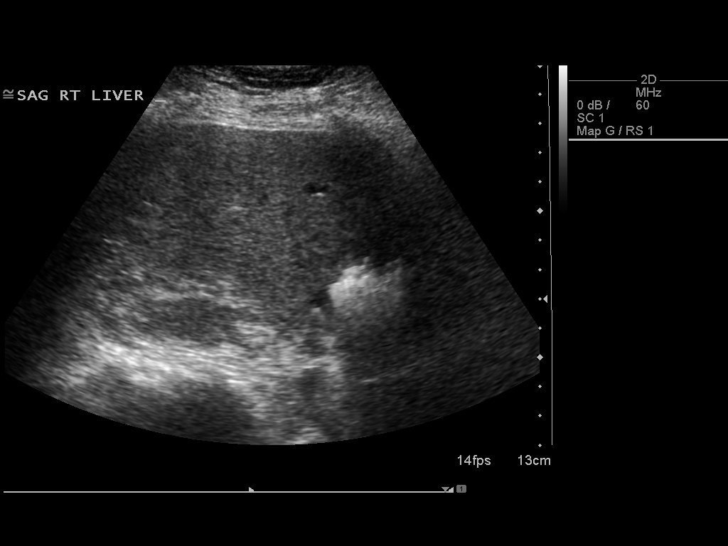
[im 34/74]
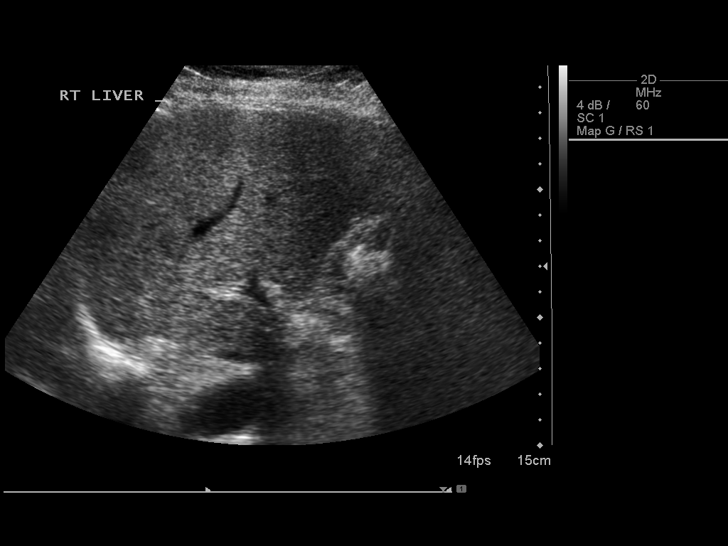
[im 40/74]
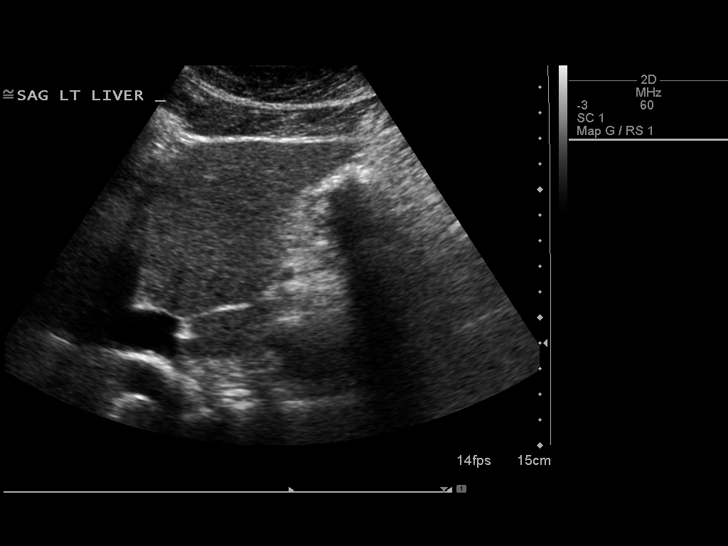
[im 46/74]
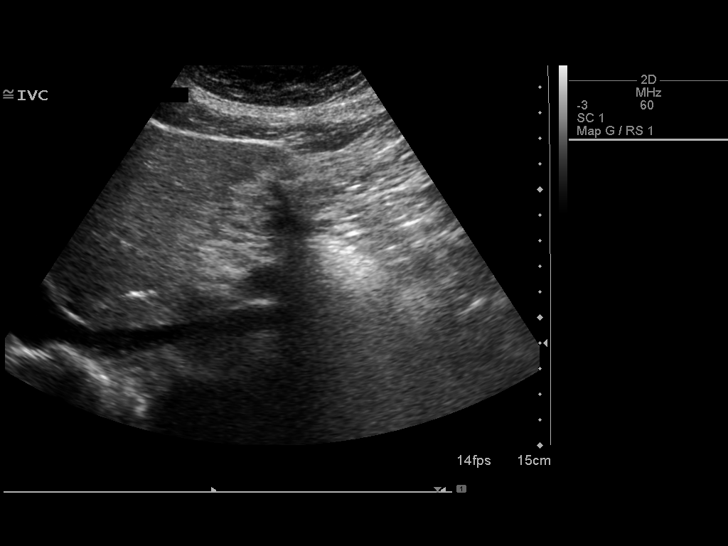
[im 49/74]
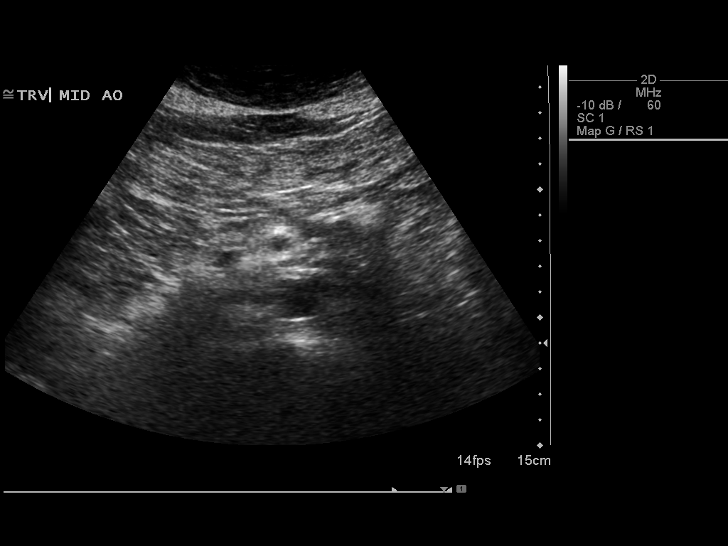
[im 55/74]
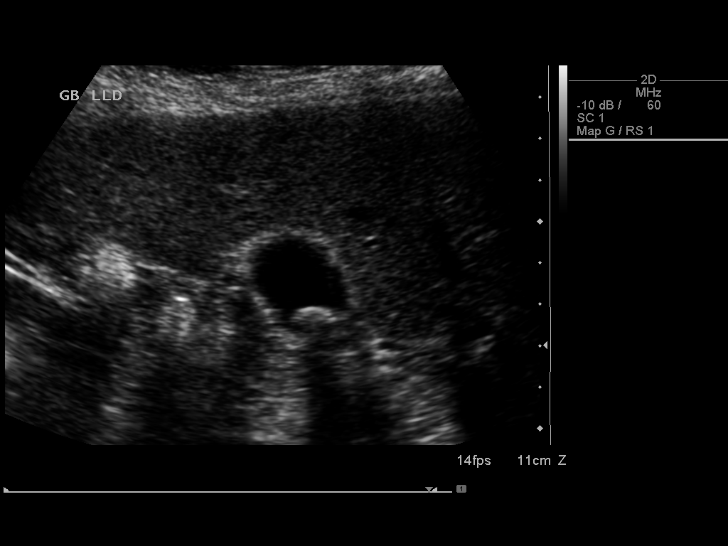
[im 61/74]
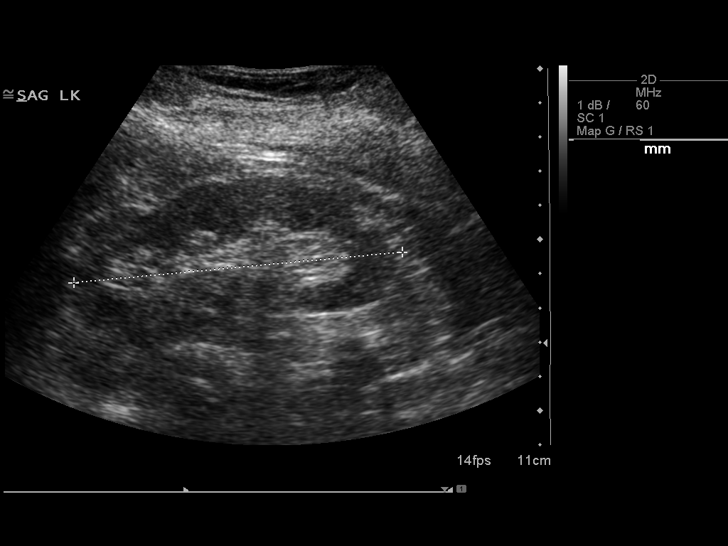
[im 67/74]
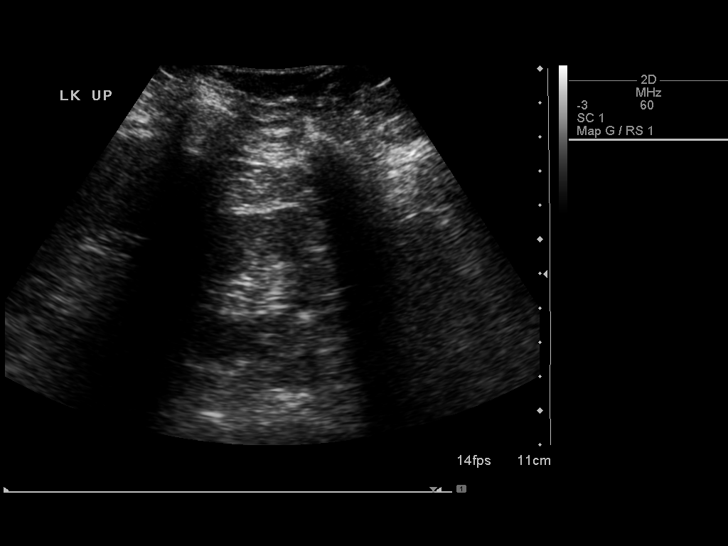
[im 74/74]
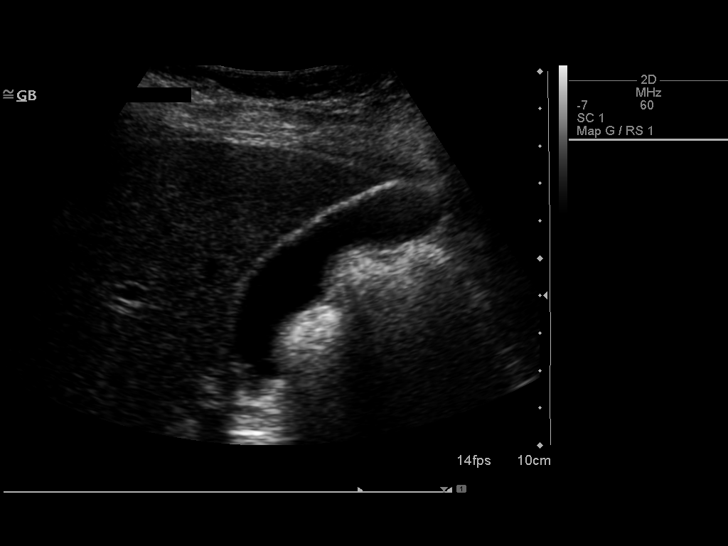

[14 of 25 positions shown; findings below may reference images not displayed]

FINDINGS: Gallbladder: Cholelithiasis. No pericholecystic fluid or gallbladder
wall thickening. Negative sonographic Murphy sign.

Common bile duct: Diameter: 3.1 mm

Liver: No focal lesion identified. Within normal limits in
parenchymal echogenicity.

IVC: No abnormality visualized.

Pancreas: Visualized portion unremarkable.

Spleen: Size and appearance within normal limits.

Right Kidney: Length: 9.4 cm. Echogenicity within normal limits. No
mass or hydronephrosis visualized.

Left Kidney: Length: 9.6 cm. Echogenicity within normal limits. No
mass or hydronephrosis visualized.

Abdominal aorta: No aneurysm visualized.

Other findings: None.
IMPRESSION: Cholelithiasis without sonographic evidence of acute cholecystitis.

## 2017-03-11 IMAGING — US US PELVIS COMPLETE
1 series · 14 of 25 positions shown · non-contrast
Comparison: CT abdomen and pelvis April 23, 2007

CLINICAL DATA: Abdominal pain

EXAM:
TRANSABDOMINAL AND TRANSVAGINAL ULTRASOUND OF PELVIS
TECHNIQUE: Study was performed transabdominally to optimize pelvic field of
view evaluation and transvaginally to optimize internal visceral
architecture evaluation.

[Series 1: us pelvis complete · 0.28mm/px · 14 of 34 slices shown]
[im 1/34]
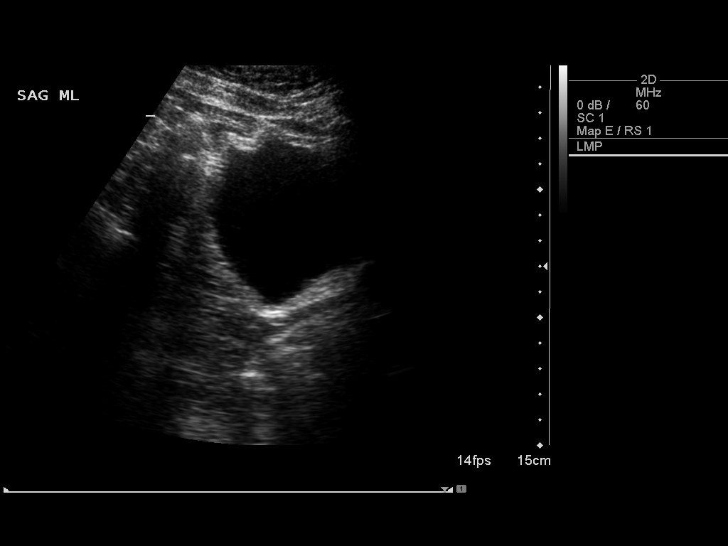
[im 3/34]
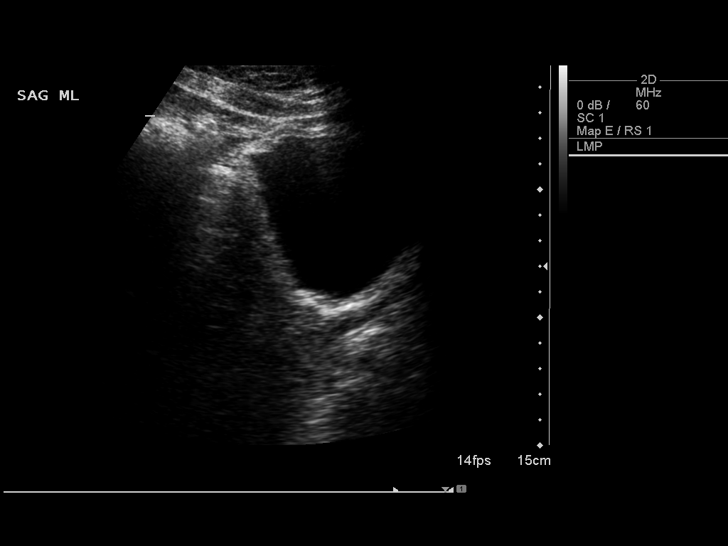
[im 6/34]
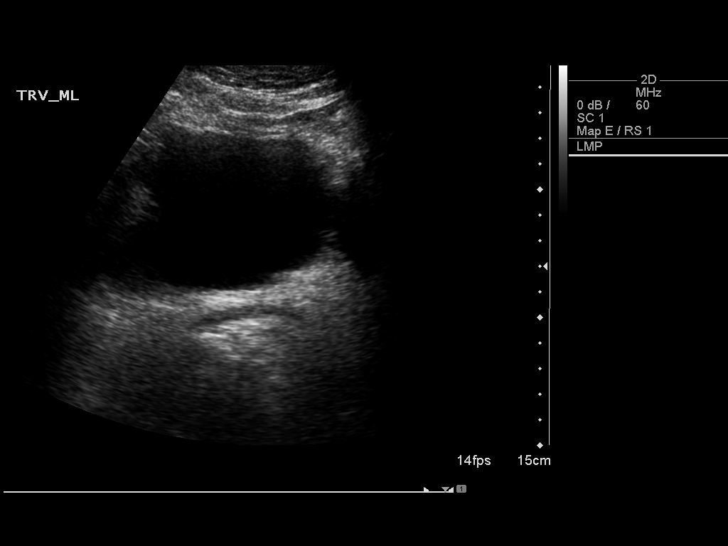
[im 9/34]
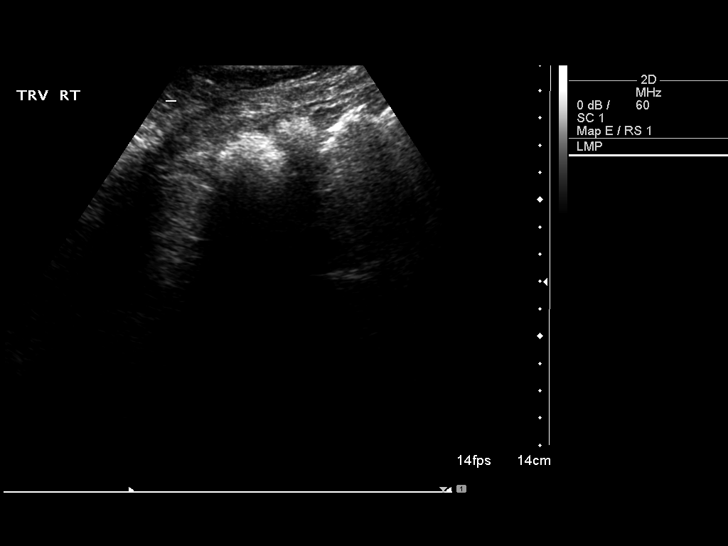
[im 12/34]
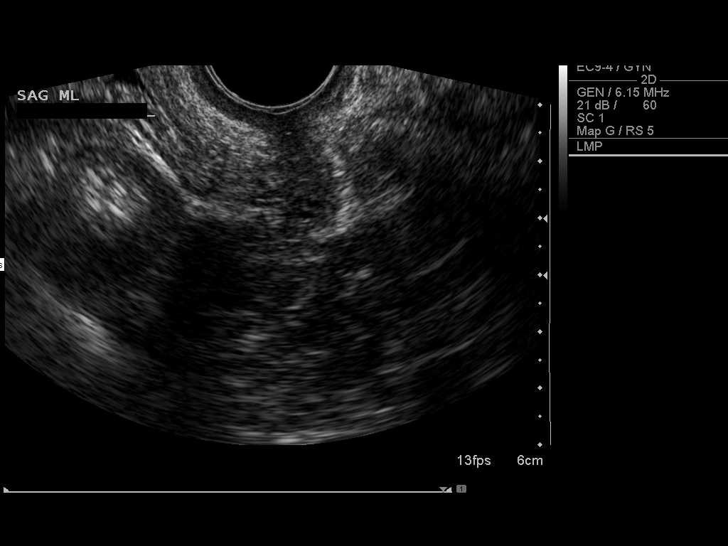
[im 13/34]
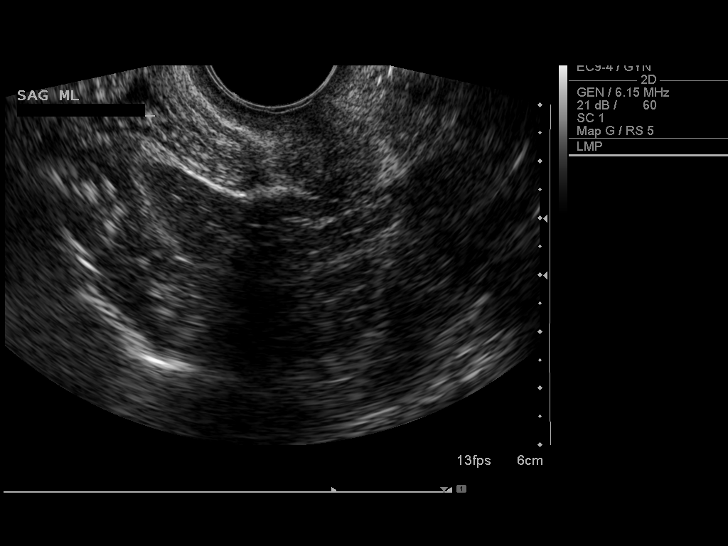
[im 16/34]
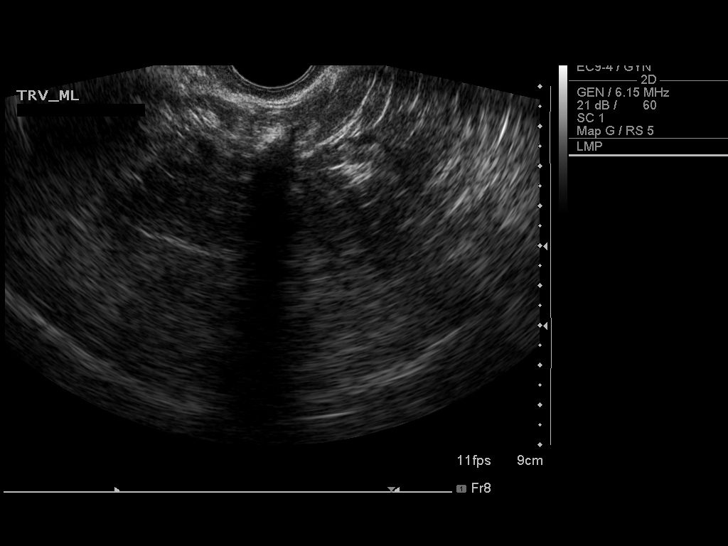
[im 18/34]
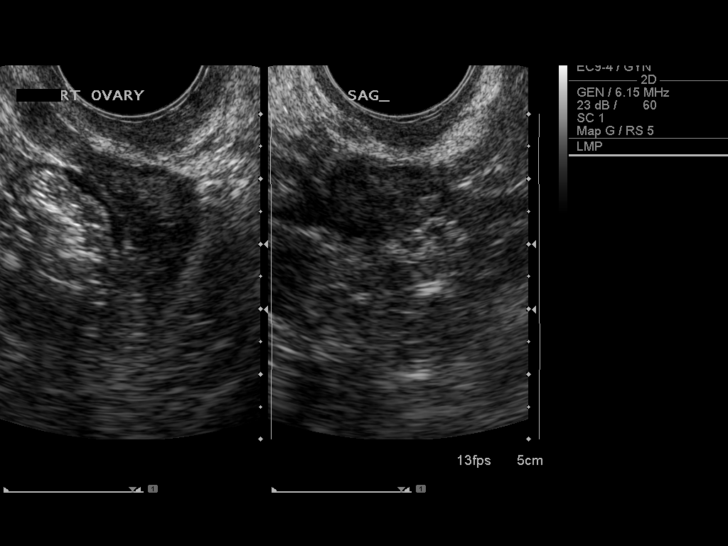
[im 21/34]
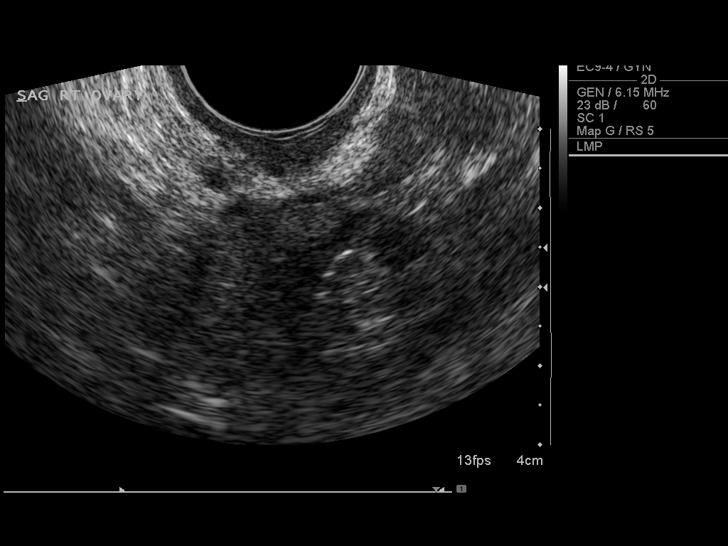
[im 23/34]
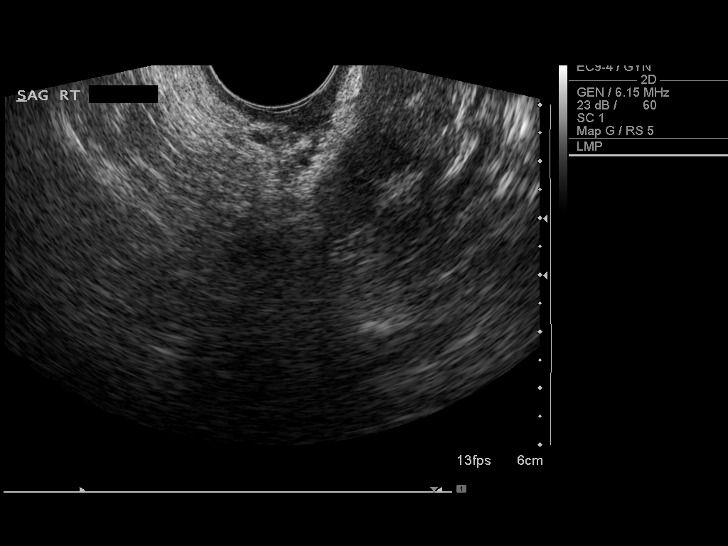
[im 25/34]
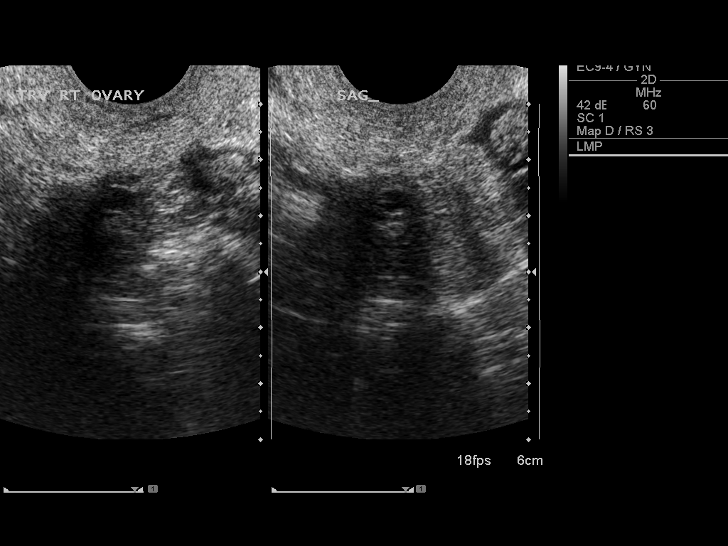
[im 28/34]
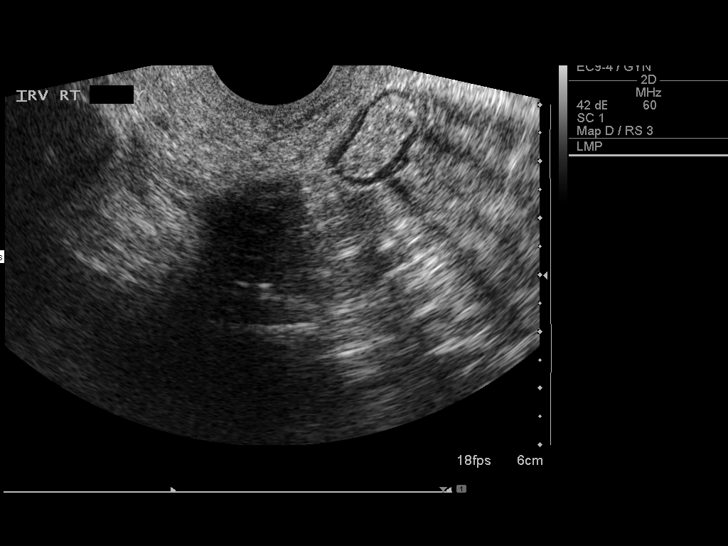
[im 31/34]
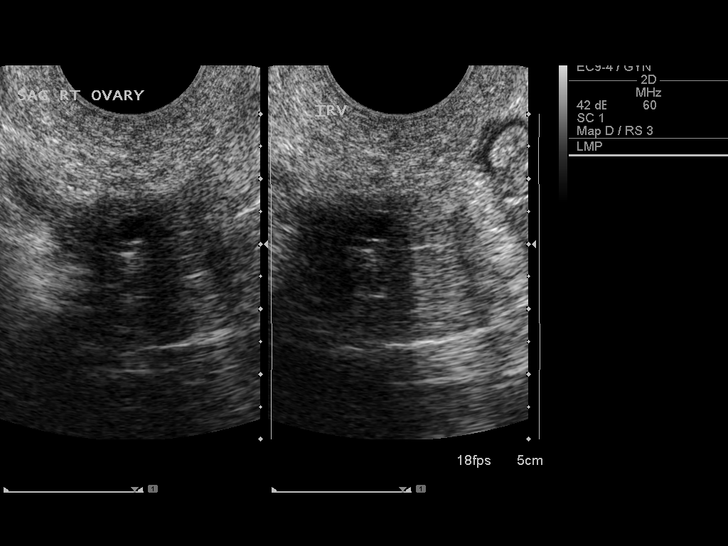
[im 34/34]
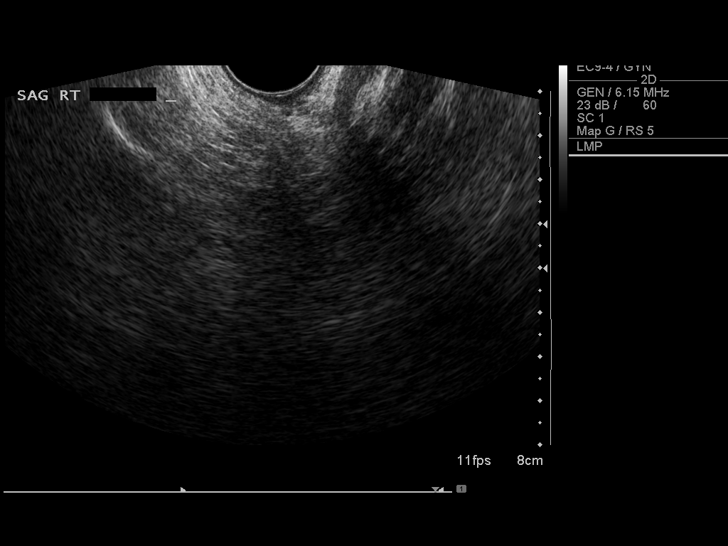

[14 of 25 positions shown; findings below may reference images not displayed]

FINDINGS: Uterus

Surgically absent.

Right ovary

Measurements: 1.4 x 2.0 x 1.4 cm. A 5 x 4 mm cyst is noted in the
right ovary. No other right-sided pelvic mass.

Left ovary

Measurements: 2.0 x 1.4 x 2.6 cm. Normal appearance/no adnexal mass.

Other findings

No abnormal free fluid.
IMPRESSION: Uterus absent. 5 x 4 mm cyst in right ovary. This is almost
certainly benign, and no specific imaging follow up is recommended
according to the Society of Radiologists in XltrasoundCPKP Consensus
Conference Statement (Eqan Tarif et al. Management of Asymptomatic
Ovarian and Other Adnexal Cysts Imaged at US: Society of
Radiologists in Ultrasound Consensus Conference Statement 8616.
Radiology [DATE]): [id number].). Study otherwise unremarkable.

## 2017-03-23 DIAGNOSIS — I6789 Other cerebrovascular disease: Secondary | ICD-10-CM | POA: Diagnosis not present

## 2017-03-23 DIAGNOSIS — M5489 Other dorsalgia: Secondary | ICD-10-CM | POA: Diagnosis not present

## 2017-03-23 DIAGNOSIS — I10 Essential (primary) hypertension: Secondary | ICD-10-CM | POA: Diagnosis not present

## 2017-03-29 DIAGNOSIS — M545 Low back pain: Secondary | ICD-10-CM | POA: Diagnosis not present

## 2017-04-04 DIAGNOSIS — M545 Low back pain: Secondary | ICD-10-CM | POA: Diagnosis not present

## 2017-04-05 ENCOUNTER — Ambulatory Visit: Payer: Medicare Other | Admitting: Registered"

## 2017-04-07 DIAGNOSIS — M545 Low back pain: Secondary | ICD-10-CM | POA: Diagnosis not present

## 2017-04-10 NOTE — Progress Notes (Signed)
Cardiology Office Note    Date:  04/11/2017   ID:  Chelsea [name], DOB 1951-11-17, MRN [id number]  PCP:  Lucianne Lei, MD  Cardiologist: Sinclair Grooms, MD   Chief Complaint  Patient presents with  . Coronary Artery Disease    History of Present Illness:  Chelsea [name] is a 66 y.o. female CVA and left-sided weakness as well as a history of aortic insufficiency presents for cardiology followup. Prior patient of Dr. Aundra Dubin and now being established for care with me.  No specific cardiac complaints. She feels she has a "rip" in a heart valve.She denies chest pain, edema,orthopnea, and PND. She does not feel limited There is no history of coronary di In reviewing the records her main reason for cardiology follow-up is "aortic insufficiency".   Past Medical History:  Diagnosis Date  . Anxiety   . Cerebrovascular accident [facility])    a. 2007-->residual left sided weakness  . Chest pain   . Depression   . GERD (gastroesophageal reflux disease)   . Headache   . Hx of hysterectomy   . Hyperlipidemia   . Hypertension   . Mild to Moderate Aortic Insufficiency    a. 08/2014 Echo: EF 55-60%, mild conc LVH, no rwma, Gr 1 DD, mild-mod AI, mild MR.  . Morbid obesity ([place])   . Obesity   . Peptic ulcer disease     Past Surgical History:  Procedure Laterality Date  . ABDOMINAL HYSTERECTOMY    . 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 TUBAL LIGATION      Current Medications: Outpatient Medications Prior to Visit  Medication Sig Dispense Refill  . amLODipine-valsartan (EXFORGE) 10-160 MG per tablet Take 1 tablet by mouth daily. 90 tablet 3  . aspirin EC 325 MG tablet Take 1 tablet (325 mg total) by mouth 2 (two) times daily. 30 tablet 0  . atorvastatin (LIPITOR) 10 MG tablet TAKE 1 TABLET(10 MG) BY MOUTH DAILY 90 tablet 3  . Calcium-Magnesium-Vitamin D (CALCIUM  500 PO) Take 1 tablet by mouth daily.     Marland Kitchen CARAFATE 1 GM/10ML suspension Take 10 mLs by mouth 4 (four) times daily.    . citalopram (CELEXA) 10 MG tablet Take 10 mg by mouth daily.    . cloNIDine (CATAPRES) 0.3 MG tablet TAKE 1 TABLET BY MOUTH TWICE DAILY 180 tablet 3  . cyanocobalamin 100 MCG tablet Take 100 mcg by mouth daily.     Marland Kitchen ezetimibe (ZETIA) 10 MG tablet Take 10 mg by mouth daily.    . furosemide (LASIX) 20 MG tablet Take 20 mg by mouth 2 (two) times daily.     Marland Kitchen gabapentin (NEURONTIN) 400 MG capsule Take 1 capsule (400 mg total) by mouth 3 (three) times daily. 90 capsule 11  . methocarbamol (ROBAXIN) 500 MG tablet 1 tablet q 6hr prn muscle spasm 60 tablet 1  . mirabegron ER (MYRBETRIQ) 25 MG TB24 tablet Take 25 mg by mouth daily.    . Multiple Vitamin (MULTIVITAMIN) tablet Take 1 tablet by mouth daily.      . pantoprazole (PROTONIX) 40 MG tablet Take 40 mg by mouth every evening.    Marland Kitchen doxazosin (CARDURA) 8 MG tablet Take 0.5 tablets (4 mg total) by mouth at bedtime. (Patient not taking: Reported on 04/11/2017) 30 tablet 3   No facility-administered medications prior to visit.  Allergies:   Other   Social History   Social History  . Marital status: Married    Spouse name: N/A  . Number of children: 3  . 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 Smoking status: Never Smoker  . Smokeless tobacco: Never Used  . Alcohol use No  . Drug use: No  . Sexual activity: Not Currently   Other Topics Concern  . None   Social History Narrative   Patient consumes no caffeine     Family History:  The patient's family history includes Cirrhosis in her father; Heart attack in her other; Hypertension in her mother; Peripheral vascular disease in her mother.   ROS:   Please see the history of present illness.    Occasional left knee discomfort. Palpitations  intermittently. Unexplained weight gain.  All other systems reviewed and are negative.   PHYSICAL EXAM:   VS:  BP (!) 144/94 (BP Location: Right Arm)   Pulse (!) 59   Ht 5\' 6"  (1.676 m)   Wt 196 lb 12.8 oz (89.3 kg)   BMI 31.76 kg/m    GEN: Well nourished, well developed, in no acute distress  HEENT: normal  Neck: no JVD, carotid bruits, or masses Cardiac: RRR. She does have a 1/6 systolic murmur, right upper sternal border. No rub. There is an S4 gallops. No edema . Respiratory:  clear to auscultation bilaterally, normal work of breathing GI: soft, nontender, nondistended, + BS MS: no deformity or atrophy  Skin: warm and dry, no rash Neuro:  Alert and Oriented x 3, Strength and sensation are intact Psych: euthymic mood, full affect  Wt Readings from Last 3 Encounters:  04/11/17 196 lb 12.8 oz (89.3 kg)  03/10/17 198 lb 3.2 oz (89.9 kg)  01/04/17 193 lb (87.5 kg)      Studies/Labs Reviewed:   EKG:  EKG  Sinus bradycardia at 59 bpm, biatrial abnormality, voltage criteria for LVH. No change compared with prior.  Recent Labs: 08/09/2016: BUN 14; Creatinine, Ser 1.20; Hemoglobin 14.6; Platelets 237; Potassium 3.5; Sodium 145   Lipid Panel    Component Value Date/Time   CHOL 134 04/20/2016 1409   TRIG 99 04/20/2016 1409   HDL 54 04/20/2016 1409   CHOLHDL 2.5 04/20/2016 1409   VLDL 20 04/20/2016 1409   LDLCALC 60 04/20/2016 1409    Additional studies/ records that were reviewed today include:    Echocardiogram 12/2015: ------------------------------------------------------------------- Study Conclusions  - Left ventricle: The cavity size was mildly dilated. Wall   thickness was increased in a pattern of mild LVH. Systolic   function was normal. The estimated ejection fraction was in the   range of 55% to 60%. Wall motion was normal; there were no   regional wall motion abnormalities. Doppler parameters are   consistent with abnormal left ventricular relaxation  (grade 1   diastolic dysfunction). Doppler parameters are consistent with   elevated ventricular end-diastolic filling pressure. - Aortic valve: There was mild regurgitation. Mean gradient (S): 7   mm Hg. Peak gradient (S): 15 mm Hg. Valve area (VTI): 2.63 cm^2.   Valve area (Vmax): 2.45 cm^2. Valve area (Vmean): 2.75 cm^2. - Mitral valve: There was mild regurgitation. - Left atrium: The atrium was mildly dilated. - Atrial septum: No defect or patent foramen ovale was identified.   ASSESSMENT:  1. Aortic valve disorder   2. Essential hypertension   3. Nonrheumatic aortic valve insufficiency      PLAN:  In order of problems listed above:  1. The aortic valve issue is stable. No obvious structural abnormality and the most recent echo demonstrated mild aortic regurgitation. 2. Hypertension with evidence that his, echo demonstrating moderate LVH Low pressure today is under reason Target should be less than 140/90 millimeters mercury. 2 g sodium diet. Weight loss. Aerobic activity. 3. Target LDL less than 70. Diet and exercise are recommended.  Overall plan is clinical follow-up in    Medication Adjustments/Labs and Tests Ordered: Current medicines are reviewed at length with the patient today.  Concerns regarding medicines are outlined above.  Medication changes, Labs and Tests ordered today are listed in the Patient Instructions below. There are no Patient Instructions on file for this visit.   Signed, Sinclair Grooms, MD  04/11/2017 10:28 AM    [facility] [address], [place], [place]  [address] Phone: [phone]; Fax: [phone]

## 2017-04-11 ENCOUNTER — Encounter (INDEPENDENT_AMBULATORY_CARE_PROVIDER_SITE_OTHER): Payer: Self-pay

## 2017-04-11 ENCOUNTER — Ambulatory Visit (INDEPENDENT_AMBULATORY_CARE_PROVIDER_SITE_OTHER): Payer: Medicare Other | Admitting: Interventional Cardiology

## 2017-04-11 ENCOUNTER — Encounter: Payer: Self-pay | Admitting: Interventional Cardiology

## 2017-04-11 VITALS — BP 144/94 | HR 59 | Ht 66.0 in | Wt 196.8 lb

## 2017-04-11 DIAGNOSIS — I359 Nonrheumatic aortic valve disorder, unspecified: Secondary | ICD-10-CM

## 2017-04-11 DIAGNOSIS — E783 Hyperchylomicronemia: Secondary | ICD-10-CM

## 2017-04-11 DIAGNOSIS — I10 Essential (primary) hypertension: Secondary | ICD-10-CM

## 2017-04-11 MED ORDER — ASPIRIN EC 81 MG PO TBEC: 81.0000 mg | DELAYED_RELEASE_TABLET | Freq: Every day | ORAL | Status: DC

## 2017-04-11 NOTE — Patient Instructions (Addendum)
Medication Instructions:  DECREASE Aspirin to 81 mg once daily  Labwork: None Ordered   Testing/Procedures: None Ordered   Follow-Up: Your physician wants you to follow-up in: Dr. Tamala Julian.  You will receive a reminder letter in the mail two months in advance. If you don't receive a letter, please call our office to schedule the follow-up appointment.  Dr. Tamala Julian recommends you keep your blood pressure < 140/90 mmHg, eat a low sodium diet (< 2 grams sodium per day) and get 30 minutes of aerobic activity daily  If you need a refill on your cardiac medications before your next appointment, please call your pharmacy.   Thank you for choosing [facility]! Christen Bame, RN [phone]

## 2017-04-12 ENCOUNTER — Encounter: Payer: Medicare Other | Attending: Family Medicine | Admitting: Registered"

## 2017-04-12 DIAGNOSIS — Z713 Dietary counseling and surveillance: Secondary | ICD-10-CM | POA: Diagnosis not present

## 2017-04-12 DIAGNOSIS — E669 Obesity, unspecified: Secondary | ICD-10-CM

## 2017-04-12 DIAGNOSIS — R7303 Prediabetes: Secondary | ICD-10-CM | POA: Insufficient documentation

## 2017-04-12 DIAGNOSIS — I10 Essential (primary) hypertension: Secondary | ICD-10-CM | POA: Diagnosis not present

## 2017-04-12 NOTE — Progress Notes (Addendum)
Medical Nutrition Therapy:  Appt start time: 2409 end time:  1135   Assessment:  Primary concerns today: Chelsea [name] is here today for a weight managemetn follow up visit. Pt has gained 2 lbs since last visit. Pt states she gets frustrated because she is not losing weight and then will eat (emotional eating). Pt states her exhusband and kids stress her out. When RD offered resources for counselor, Pt states she will talk to Dr. Criss Rosales for referral. Pt states her heart doctor wants to keep BP under control.   Pt states her weight loss goal is 165 lbs. (reported 175 lbs at last visit).  Preferred Learning Style:   No preference indicated   Learning Readiness:   Ready  MEDICATIONS: see list   DIETARY INTAKE:  Usual eating pattern includes 3  meals and 3 snacks per day.  Avoided foods include mushrooms, okra, black beans    24-hr recall:  B ( AM): scrambled egg, grits, [place] bacon, toast or bagel Snk ( AM): yogurt OR melon L (12 PM): PB&J (sugar free) Snk ( PM): cheese crackers  D ( PM): spaghetti Snk ( PM): none Beverages: started water with lemon, Ocean Spray 5 calorie, strawberry kiwi, bottle Powerade Zero, coffee sometimes with stevia  Usual physical activity: exercises in her bed  Estimated energy needs: 1600 calories 180 g carbohydrates 120 g protein 44 g fat  Progress Towards Goal(s):  In progress.   Nutritional Diagnosis:  [place]-3.3 Overweight/obesity As related to hx of energy dense food choices and sugary beverages.  As evidenced by BMI of 35.9.    Intervention:  Nutrition counseling provided.  Plan:  Look at your snacks foods for the sodium content.  Foods that are high in sodium include many canned goods, soups, and pre-packaged foods.  Use the Low-Sodium seasoning ideas for meat and vegetables.  Peanut butter (to go for convenience) is a good snack with fruits & vegetables.  Continue walking for heart health For other counseling support look into [facility] [place], [place] [address] Phone: [phone] [email or web address]   Teaching Method Utilized:  Visual Auditory Hands on  Handouts given during visit include:  Low Sodium Seasonings  Barriers to learning/adherence to lifestyle change: limited income, limited mobility  Demonstrated degree of understanding via:  Teach Back   Monitoring/Evaluation:  Dietary intake, exercise, and body weight in 3 month(s).

## 2017-04-12 NOTE — Patient Instructions (Addendum)
   Look at your snacks foods for the sodium content.  Foods that are high in sodium include many canned goods, soups, and pre-packaged foods.  Use the Low-Sodium seasoning ideas for meat and vegetables.  Peanut butter (to go for convenience) is a good snack with fruits & vegetables.  Continue walking for heart health  [facility] [place], [place] [address] Phone: [phone] [email or web address]

## 2017-04-13 DIAGNOSIS — M545 Low back pain: Secondary | ICD-10-CM | POA: Diagnosis not present

## 2017-04-15 DIAGNOSIS — M545 Low back pain: Secondary | ICD-10-CM | POA: Diagnosis not present

## 2017-04-18 DIAGNOSIS — M545 Low back pain: Secondary | ICD-10-CM | POA: Diagnosis not present

## 2017-04-20 DIAGNOSIS — M545 Low back pain: Secondary | ICD-10-CM | POA: Diagnosis not present

## 2017-04-24 ENCOUNTER — Other Ambulatory Visit: Payer: Self-pay | Admitting: Neurology

## 2017-04-24 DIAGNOSIS — G8194 Hemiplegia, unspecified affecting left nondominant side: Secondary | ICD-10-CM

## 2017-04-24 DIAGNOSIS — G890 Central pain syndrome: Secondary | ICD-10-CM

## 2017-04-24 DIAGNOSIS — R259 Unspecified abnormal involuntary movements: Secondary | ICD-10-CM

## 2017-04-24 DIAGNOSIS — I619 Nontraumatic intracerebral hemorrhage, unspecified: Secondary | ICD-10-CM

## 2017-04-27 DIAGNOSIS — M545 Low back pain: Secondary | ICD-10-CM | POA: Diagnosis not present

## 2017-05-03 DIAGNOSIS — M545 Low back pain: Secondary | ICD-10-CM | POA: Diagnosis not present

## 2017-05-05 DIAGNOSIS — M545 Low back pain: Secondary | ICD-10-CM | POA: Diagnosis not present

## 2017-05-10 ENCOUNTER — Other Ambulatory Visit: Payer: Self-pay | Admitting: Cardiology

## 2017-05-10 DIAGNOSIS — M545 Low back pain: Secondary | ICD-10-CM | POA: Diagnosis not present

## 2017-05-10 NOTE — Telephone Encounter (Signed)
Okay to refill these? Previous patient of Dr Aundra Dubin. Most recent lipid panel in epic is from 03/2016. Please advise. Thanks, MI

## 2017-05-11 DIAGNOSIS — H25813 Combined forms of age-related cataract, bilateral: Secondary | ICD-10-CM | POA: Diagnosis not present

## 2017-05-11 NOTE — Telephone Encounter (Signed)
That's fine

## 2017-05-12 DIAGNOSIS — M545 Low back pain: Secondary | ICD-10-CM | POA: Diagnosis not present

## 2017-05-17 DIAGNOSIS — M545 Low back pain: Secondary | ICD-10-CM | POA: Diagnosis not present

## 2017-05-19 DIAGNOSIS — M545 Low back pain: Secondary | ICD-10-CM | POA: Diagnosis not present

## 2017-05-23 DIAGNOSIS — I10 Essential (primary) hypertension: Secondary | ICD-10-CM | POA: Diagnosis not present

## 2017-05-23 DIAGNOSIS — I1310 Hypertensive heart and chronic kidney disease without heart failure, with stage 1 through stage 4 chronic kidney disease, or unspecified chronic kidney disease: Secondary | ICD-10-CM | POA: Diagnosis not present

## 2017-05-23 DIAGNOSIS — I69952 Hemiplegia and hemiparesis following unspecified cerebrovascular disease affecting left dominant side: Secondary | ICD-10-CM | POA: Diagnosis not present

## 2017-05-23 DIAGNOSIS — E876 Hypokalemia: Secondary | ICD-10-CM | POA: Diagnosis not present

## 2017-05-30 DIAGNOSIS — M545 Low back pain: Secondary | ICD-10-CM | POA: Diagnosis not present

## 2017-06-07 DIAGNOSIS — M545 Low back pain: Secondary | ICD-10-CM | POA: Diagnosis not present

## 2017-07-13 ENCOUNTER — Ambulatory Visit: Payer: Medicare Other | Admitting: Registered"

## 2017-07-19 ENCOUNTER — Encounter: Payer: Medicare Other | Attending: Family Medicine | Admitting: Registered"

## 2017-07-19 DIAGNOSIS — R7303 Prediabetes: Secondary | ICD-10-CM | POA: Diagnosis not present

## 2017-07-19 DIAGNOSIS — Z713 Dietary counseling and surveillance: Secondary | ICD-10-CM | POA: Diagnosis not present

## 2017-07-19 DIAGNOSIS — E669 Obesity, unspecified: Secondary | ICD-10-CM

## 2017-07-19 NOTE — Patient Instructions (Addendum)
   Think about switching up your breakfast cereal and try Cheerios.  Try yogurt try fat free yogurt with carbs less than 15 grams.  Try an egg, [place], cheese and spinach on a English muffin, with grapes for breakfast.  Popcorn is fine for snacks pair with protein such as nuts  Continue having vegetables (spinach, green beans, broccoli, cauliflower) with lunch and dinner.  Try to get back doing exercises each day.  When buying ground beef or [place] burger look for 85% lean or high.  I dinner idea is Chicken or Burger (palm of your hand), sweet potatoes 1/2 c, and 1 c spinach.  Add more of your steamed spinach to more meals.

## 2017-07-19 NOTE — Progress Notes (Signed)
Medical Nutrition Therapy:  Appt start time: 4970 end time:  1215.   Assessment:  Primary concerns today: follow-up for weight management. Pt states she shops at [facility] Lot and per pt they have limited options for yogurt and no [place] yogurt. Pt states neighbor bought 6 pack diet coke but doesn't drink it much.   RD helped patient create a grocery shopping list.  Preferred Learning Style:   No preference indicated   Learning Readiness:   Change in progress  MEDICATIONS: reviewed   DIETARY INTAKE:  24-hr recall:  B ( AM): cinnamon toast crunch  Snk ( AM): carrots L ( PM): peanut on honeywheat Snk ( PM): crackers/ popcorn D ( PM): bean burritos Snk ( PM): fruit Beverages: fruit flavored water, water  Usual physical activity: too stressed out about ex-husband's problems to exercise.  Estimated energy needs: 1600 calories 180 g carbohydrates 120 g protein 44 g fat  Progress Towards Goal(s):  In progress.   Nutritional Diagnosis:  [place]-3.3 Overweight/obesity As related to hx of energy dense food choices and sugary beverages.  As evidenced by BMI of 35.9    Intervention:  Nutrition counseling.  Plan:  Think about switching up your breakfast cereal and try Cheerios.  Try yogurt try fat free yogurt with carbs less than 15 grams.  Try an egg, [place], cheese and spinach on a English muffin, with grapes for breakfast.  Popcorn is fine for snacks pair with protein such as nuts  Continue having vegetables (spinach, green beans, broccoli, cauliflower) with lunch and dinner.  Try to get back doing exercises each day.  When buying ground beef or [place] burger look for 85% lean or high.  I dinner idea is Chicken or Burger (palm of your hand), sweet potatoes 1/2 c, and 1 c spinach.  Add more of your steamed spinach to more meals.  Teaching Method Utilized:  Visual Auditory  Handouts given during visit include:  none  Barriers to learning/adherence to lifestyle  change: limited finances and reduced mobility due to effects of stroke  Demonstrated degree of understanding via:  Teach Back   Monitoring/Evaluation:  Dietary intake, exercise, and body weight in 3 month(s).

## 2017-08-22 DIAGNOSIS — I119 Hypertensive heart disease without heart failure: Secondary | ICD-10-CM | POA: Diagnosis not present

## 2017-08-22 DIAGNOSIS — E785 Hyperlipidemia, unspecified: Secondary | ICD-10-CM | POA: Diagnosis not present

## 2017-08-22 DIAGNOSIS — Z23 Encounter for immunization: Secondary | ICD-10-CM | POA: Diagnosis not present

## 2017-09-12 ENCOUNTER — Ambulatory Visit: Payer: Medicare Other | Admitting: Neurology

## 2017-10-04 DIAGNOSIS — I10 Essential (primary) hypertension: Secondary | ICD-10-CM | POA: Diagnosis not present

## 2017-10-04 DIAGNOSIS — E0811 Diabetes mellitus due to underlying condition with ketoacidosis with coma: Secondary | ICD-10-CM | POA: Diagnosis not present

## 2017-10-10 ENCOUNTER — Ambulatory Visit (INDEPENDENT_AMBULATORY_CARE_PROVIDER_SITE_OTHER): Payer: Medicare Other | Admitting: Neurology

## 2017-10-10 ENCOUNTER — Encounter: Payer: Self-pay | Admitting: Neurology

## 2017-10-10 VITALS — BP 192/100 | HR 77 | Ht 66.0 in | Wt 206.0 lb

## 2017-10-10 DIAGNOSIS — I619 Nontraumatic intracerebral hemorrhage, unspecified: Secondary | ICD-10-CM

## 2017-10-10 DIAGNOSIS — G890 Central pain syndrome: Secondary | ICD-10-CM

## 2017-10-10 MED ORDER — GABAPENTIN 800 MG PO TABS: 800.0000 mg | ORAL_TABLET | Freq: Two times a day (BID) | ORAL | 5 refills | Status: DC

## 2017-10-10 NOTE — Progress Notes (Signed)
Subjective:    [name] ID: Chelsea [name] is a 66 y.o. female.  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 She is unaccompanied today. I last saw her on 09/09/2016, at which time she reported doing okay as far as Chelsea pain on Chelsea L side. She was seen by Dr. Aundra Dubin in cardiology on 04/20/2016. For her LBP, she had seen Dr. Jacelyn Grip and had received injections. L knee was okay, finished PT.  She saw Ward Givens in Chelsea interim on 03/10/2017, at which time she reported residual pain in her gabapentin was increased to 400 mg 3 times a day.  Today, 10/10/2017: She reports that she takes 2 pills of Chelsea 400 mg about once daily, not 400 mg tid. She feels, Chelsea 800 mg helps in one dose. No recent illness, but does report increase in stress lately. Both her ex-husbands have been sick, she recently reunited with her younger brother after about 40+ years.    Chelsea [name]'s allergies, current medications, family history, past medical history, past social history, past surgical history and problem list were reviewed and updated as appropriate.    Previously:  I saw her on 02/11/2016, at which time she reported that gabapentin was helping. She did have residual pain on Chelsea left side. She was noted to have an irregular pulse on my exam and denied any chest pain or shortness of breath, but reported that she herself had noted irregular heartbeat from time to time. She had not seen cardiology since 2014. She had an echocardiogram and EKG for surgical clearance recently.she underwent left  total knee arthroplasty on 01/05/2016, under Dr. Mayer Camel. She was coming along quite well after that, still was in physical therapy. Unfortunately, her pain medication had caused severe nausea and vomiting. She stopped taking narcotic pain medication in February 2017. I suggested she continue with gabapentin 300 mg 3 times a day.     I saw her on 08/14/2015, at which time she reported doing fairly well. She requested a letter so she can have help with her trash can as she was not able to move her trash can by herself. She felt that Chelsea increase in gabapentin was helpful. She was not driving. She had ongoing left-sided weakness and left-sided involuntary movements but stable. She had gained some weight and was scheduled to see a nutritionist. I suggested she continue with gabapentin 300 mg 3 times a day.   I saw her on 03/10/2015, at which time she reported that Chelsea gabapentin was somewhat helpful. She had some residual pain. She was taking 300 mg each night. We gradually increased it to tid.    I first met her on 11/06/2014 at Chelsea request of her primary care physician, at which time Chelsea [name] reported a several year history of left-sided pain in her face as well as hemibody. Symptoms started perhaps a few years after her stroke in 2007. I suggested symptomatic treatment with gabapentin. 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 I suggested she titrate gabapentin starting at 100 mg at  night.   She reports left-sided pain in her face, as well as Chelsea left hemibody. Symptoms started gradually, but she does not know exactly when they started. It did not happen immediately after Chelsea stroke in 2007, perhaps a few years later. She has residual left-sided weakness and walks with a cane. She had therapy and home health therapy and was advised to use a cane. She describes a warmth sensation on Chelsea left side of her face  and hemibody. This can be a burning pain as well at times. It is not always Chelsea same or constant. She has a tremor on Chelsea left side and difficulty with fine motor control in Chelsea left. In addition she has neck and lower back pain. She has seen a chiropractor for this. She had seen [place] for this and her knee in Chelsea past. She's not able to pinpoint Chelsea degree of pain on Chelsea left side. Sometimes it is not actually a pain but an abnormal temperature sensation. It is not necessarily debilitating but at times quite painful when it has Chelsea burning sensation to it. She lives alone. She has 3 grown children. Her grandson is currently visiting. She does not smoke. She is on a baby aspirin. Of note, she had a right thalamic hemorrhagic stroke. She had an MRI brain with and without contrast on 11/20/2008: 1.  No evidence of acute ischemia. 2.  Stable non specific supratentorial subcortical white matter changes most likely representing areas of ischemic gliosis due to small vessel disease related to hypertension or diabetes. 3.  Interval near-complete resolution of Chelsea previous right thalamic hemorrhage. 4.  Small focal area of enhancement within Chelsea epicenter of Chelsea hemorrhage, which probably represents contrast pooling.  No evidence of a vascular abnormality however is suggested on Chelsea MRI examination.   She had a brain MRI with and without contrast and brain MRA without contrast on 04/22/2006:1.  Right thalamic hematoma with surrounding mass effect secondary to vasogenic edema. 2.  Mild mass effect on Chelsea inferior aspect of Chelsea third ventricle. 3.  No imaging evidence of abnormal enhancement or blood vessel ending from periphery of this hemorrhage.   4.  Mild sinusitis changes.   1.  No evidence of occlusion, stenosis, dissections or brain aneurysms noted on Chelsea images provided. 2.  Aneurysms of 5 mm or less may not be seen on MRA examination.     I personally reviewed Chelsea images through Chelsea PACS  system and also shared images with her on Chelsea computer.   Of note, she is not sure if she snores. She denies gasping sensations and has not been told that she has pauses in her breathing.  Her Past Medical History Is Significant For: Past Medical History:  Diagnosis Date  . Anxiety   . Cerebrovascular accident [facility])    a. 2007-->residual left sided weakness  . Chest pain   . Depression   . GERD (gastroesophageal reflux disease)   . Headache   . Hx of hysterectomy   . Hyperlipidemia   . Hypertension   . Mild to Moderate Aortic Insufficiency    a. 08/2014 Echo: EF 55-60%, mild conc LVH, no rwma, Gr 1 DD, mild-mod AI, mild MR.  . Morbid obesity ([place])   . Obesity   . Peptic ulcer disease     Her Past Surgical History Is Significant For: Past Surgical History:  Procedure Laterality Date  . ABDOMINAL HYSTERECTOMY    . TUBAL LIGATION      Her  Family History Is Significant For: Family History  Problem Relation Age of Onset  . Hypertension Mother        deceased  . Peripheral vascular disease Mother   . Cirrhosis Father        DeCEASED  . Heart attack Other        grandmother deceased    Her Social History Is Significant For: Social History   Socioeconomic History  . Marital status: Married    Spouse name: None  . Number of children: 3  . Years of education: 70  . Highest education level: None  Social Needs  . Financial resource strain: None  . Food insecurity - worry: None  . Food insecurity - inability: None  . Transportation needs - medical: None  . Transportation needs - non-medical: None  Occupational History  . 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 Smoking status: Never Smoker  . Smokeless tobacco: Never Used  Substance and Sexual Activity  . Alcohol use: No    Alcohol/week: 0.0 oz  . Drug use: No  . Sexual activity: Not Currently  Other Topics Concern  . None  Social History  Narrative   [name] consumes no caffeine    Her Allergies Are:  Allergies  Allergen Reactions  . Other Swelling    Hair dye, caused eye swelling, multiple times experienced this reaction  :   Her Current Medications Are:  Outpatient Encounter Medications as of 10/10/2017  Medication Sig  . amLODipine-valsartan (EXFORGE) 10-160 MG per tablet Take 1 tablet by mouth daily.  Marland Kitchen aspirin EC 81 MG tablet Take 1 tablet (81 mg total) by mouth daily.  Marland Kitchen atorvastatin (LIPITOR) 10 MG tablet TAKE 1 TABLET(10 MG) BY MOUTH DAILY  . Calcium-Magnesium-Vitamin D (CALCIUM 500 PO) Take 1 tablet by mouth daily.   Marland Kitchen CARAFATE 1 GM/10ML suspension Take 10 mLs by mouth 4 (four) times daily.  . citalopram (CELEXA) 10 MG tablet Take 10 mg by mouth daily.  . cloNIDine (CATAPRES) 0.3 MG tablet TAKE 1 TABLET BY MOUTH TWICE DAILY  . cyanocobalamin 100 MCG tablet Take 100 mcg by mouth daily.   Marland Kitchen doxazosin (CARDURA) 8 MG tablet Take 8 mg by mouth at bedtime.  Marland Kitchen ezetimibe (ZETIA) 10 MG tablet Take 10 mg by mouth daily.  . furosemide (LASIX) 20 MG tablet Take 20 mg by mouth 2 (two) times daily.   Marland Kitchen gabapentin (NEURONTIN) 400 MG capsule Take 1 capsule (400 mg total) by mouth 3 (three) times daily.  . methocarbamol (ROBAXIN) 500 MG tablet 1 tablet q 6hr prn muscle spasm  . mirabegron ER (MYRBETRIQ) 25 MG TB24 tablet Take 25 mg by mouth daily.  . Multiple Vitamin (MULTIVITAMIN) tablet Take 1 tablet by mouth daily.    . pantoprazole (PROTONIX) 40 MG tablet Take 40 mg by mouth every evening.  Marland Kitchen TRAMADOL HCL ER PO Take by mouth.   No facility-administered encounter medications on file as of 10/10/2017.   :  Review of Systems:  Out of a complete 14 point review of systems, all are reviewed and negative with Chelsea exception of these symptoms as listed below: Review of Systems  Neurological:       Pt presents today to follow up on her pain. Pt is complaining of a burning pain in her arms. Pt is taking gabapentin.     Objective:  Neurological Exam  Physical Exam Physical Examination:   Vitals:  10/10/17 1400  BP: (!) 192/100  Pulse: 77   General Examination: Chelsea [name] is a very pleasant 66 y.o. female in no acute distress. She appears well-developed and well-nourished and well groomed.   HEENT: Normocephalic, atraumatic, pupils are equal, round and reactive to light and accommodation. She has mild b/l cataracts. She wears corrective eyeglasses. Extraocular tracking is good without limitation to gaze excursion or nystagmus noted. Normal smooth pursuit is noted. Hearing is grossly intact. Face is asymmetric with mild left lower facial weakness noted, unchanged. Speech is clear. She has no significant dysarthria. She has decreased sensation to all modalities on Chelsea left face. Oropharynx exam reveals: moderate mouth dryness, adequate dental hygiene and mild airway crowding.   Chest: Clear to auscultation without wheezing, rhonchi or crackles noted.  Heart: S1+S2+0, regular, no murmur noted today.    Abdomen: Soft, non-tender and non-distended with normal bowel sounds appreciated on auscultation.  Extremities: There is trace pitting edema in Chelsea distal lower extremities bilaterally. Pedal pulses are intact.  Skin: Warm and dry without trophic changes noted. There are no varicose veins.  Musculoskeletal: exam reveals no obvious joint deformities, tenderness or joint swelling or erythema, other than unremarkable scar over Chelsea left knee, mild swelling of Chelsea left knee. She has some LBP. All unchanged.   Neurologically:  Mental status: Chelsea [name] is awake, alert and oriented in all 4 spheres. Her immediate and remote memory, attention, language skills and fund of knowledge are appropriate. There is no evidence of aphasia, agnosia, apraxia or anomia. Speech is clear with normal prosody and enunciation. Thought process is linear. Mood is normal and affect is normal.  Cranial nerves II - XII are  as described above under HEENT exam.  Motor exam: She has an overall fairly thin bulk. Strength is normal on Chelsea right. She has 4 out of 5 weakness on Chelsea left, stable. She has no involuntary movements at rest in Chelsea left upper extremity, but has a coarse intention tremor on Chelsea left, no significant postural tremor in either upper extremity. She has difficulty with fine motor skills and has involuntary movements in Chelsea left upper extremity in particular with action. All unchanged. Sensory exam shows decreased all modalities on Chelsea left hemibody. Reflexes are mildly hyperactive on Chelsea left and normal on Chelsea right. She has mild difficulty standing and walks with a slight limp on Chelsea left, has a 4 pronged cane. She also reports soreness in her left knee when standing and walking. Tandem walk is not possible. Romberg is not possible.  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 Aundra Dubin in cardiology), and status post left total knee replacement in 2016, who presents for follow-up consultation of her thalamic pain syndrome, in Chelsea context of thalamic stroke with residual weakness and pain symptoms of left-sided pain, including her face, and left hemibody. Symptomatic treatment with gabapentin 300 mg 3 times a day has been fairly successful in Chelsea past. She has found that it is more effective for her to take 800 mg once daily. She does not take 400 mg 3 times a day as discussed last time when she saw [place]. Her exam is otherwise stable. I suggested that I increase her gabapentin prescription to 800 mg tablets and she can take 1 tablet  up to twice daily. She is cautioned about Chelsea sedating effects of gabapentin and is aware. I suggested a six-month  follow-up with Jinny Blossom, I will see her after that. I answered all her questions today and she was in agreement. I spent 25 minutes in total face-to-face time with Chelsea [name], more than 50% of which was spent in counseling and coordination of care, reviewing test results, reviewing medication and discussing or reviewing Chelsea diagnosis of stroke and thalamic pain syndrome, Chelsea prognosis and treatment options. Pertinent laboratory and imaging test results that were available during this visit with Chelsea [name] were reviewed by me and considered in my medical decision making (see chart for details).

## 2017-10-10 NOTE — Patient Instructions (Signed)
As discussed, we will increase your neurontin to 800 mg tabs 1 pill up to twice daily, as you have had better results with 800 mg daily.   I adjusted your prescription in that regard. You can follow up with Megan in about 6 months.

## 2017-10-11 ENCOUNTER — Encounter: Payer: Medicare Other | Attending: Family Medicine | Admitting: Registered"

## 2017-10-11 DIAGNOSIS — R7303 Prediabetes: Secondary | ICD-10-CM | POA: Insufficient documentation

## 2017-10-11 DIAGNOSIS — Z713 Dietary counseling and surveillance: Secondary | ICD-10-CM | POA: Insufficient documentation

## 2017-10-11 NOTE — Patient Instructions (Addendum)
Consider finding another counselor to talk to about your feeling about your family and the emotional eating.  Try to eat balanced meals, consider having peanuts with your breakfast so you can have protein in the morning.  If you have a doctor's appointment before our next visit, ask to have your blood sugar checked and bring that blood sugar number to your next visit with me.

## 2017-10-11 NOTE — Progress Notes (Signed)
Medical Nutrition Therapy:  Appt start time: 1200 end time:  3335.  Assessment:  Patient is here for follow-up for weight management. Patients states she recently reconnected with younger brother which she hasn't seen since he was a child and had several weeks of high stress trying to track him down.  Pt state she has strong emotions when she looks at family pictures which evokes feelings of guilt and anger. Pt states when she is in these high emotional states she will over eat foods such as Hi-c punch, chips, cookies, donuts.  Pt states she enjoys watching wresting on TV and has to snack on something and this week it has been cream filled donut holes.  Preferred Learning Style:   No preference indicated   Learning Readiness:   Change in progress  MEDICATIONS: reviewed   DIETARY INTAKE:   Avoided foods include pork and tuna fish.  24-hr recall:  B ( AM): instant oatmeal or instant grits Snk ( AM): none L ( PM): PB & J sandwich Snk ( PM): yogurt OR banana D ( PM): 2 hamburgers on buns OR chalupa Snk ( PM): graham crackers  Beverages: water,   Usual physical activity: not assessed this visit  Estimated energy needs: 1600 calories 180 g carbohydrates 120 g protein 44 g fat  Progress Towards Goal(s):  No progress. Pt is gaining weight due to increased emotional eating.   Nutritional Diagnosis:  [place]-3.3 Overweight/obesity As related to hx of energy dense food choices and sugary beverages.  As evidenced by BMI of 35.9    Intervention:  Nutrition counseling.  Plan:  Consider finding another counselor to talk to about your feeling about your family and the emotional eating.  Try to eat balanced meals, consider having peanuts with your breakfast so you can have protein in the morning.  If you have a doctor's appointment before our next visit, ask to have your blood sugar checked and bring that blood sugar number to your next visit with me.  Teaching Method Utilized:   Visual Auditory  Handouts given during visit include:  10 healthy things to buy at dollar store  Healthy shopping at the dollar store  Barriers to learning/adherence to lifestyle change: limited finances and reduced mobility due to effects of stroke  Demonstrated degree of understanding via:  Teach Back   Monitoring/Evaluation:  Dietary intake, exercise, and body weight in 3 month(s).

## 2017-11-01 ENCOUNTER — Other Ambulatory Visit: Payer: Self-pay | Admitting: Family Medicine

## 2017-11-01 DIAGNOSIS — Z1231 Encounter for screening mammogram for malignant neoplasm of breast: Secondary | ICD-10-CM

## 2017-12-08 ENCOUNTER — Ambulatory Visit
Admission: RE | Admit: 2017-12-08 | Discharge: 2017-12-08 | Disposition: A | Discharge status: Home / Self Care | Payer: Medicare Other | Source: Ambulatory Visit | Attending: Family Medicine | Admitting: Family Medicine

## 2017-12-08 DIAGNOSIS — Z1231 Encounter for screening mammogram for malignant neoplasm of breast: Secondary | ICD-10-CM

## 2017-12-13 ENCOUNTER — Ambulatory Visit: Payer: Medicare Other | Admitting: Registered"

## 2017-12-14 ENCOUNTER — Encounter: Payer: Medicare Other | Attending: Family Medicine | Admitting: Registered"

## 2017-12-14 DIAGNOSIS — R7303 Prediabetes: Secondary | ICD-10-CM | POA: Diagnosis not present

## 2017-12-14 DIAGNOSIS — Z713 Dietary counseling and surveillance: Secondary | ICD-10-CM | POA: Insufficient documentation

## 2017-12-14 NOTE — Patient Instructions (Addendum)
   Consider eating beans on a regular basis.  Think about using the toaster oven or Sanjuana Letters rather than the deep fryer for your meat.  Try to eat balanced meals, consider having peanuts with your breakfast so you can have protein in the morning.  If you have a doctor's appointment before our next visit, ask to have your blood sugar checked and bring that blood sugar number to your next visit with me.

## 2017-12-14 NOTE — Progress Notes (Signed)
Medical Nutrition Therapy:  Appt start time: 1150 end time:  1230  Assessment:  Patient is here for follow-up for weight management with 0.5 lbs weight loss in 2 months. Patient believes she is struggling with weight because of family stress and has been talking to a new therapist who she reports has been trying to help her limit how much time she spends worrying about other people's problems. Patient states she plays games on her phone or listens to music to occupy her mind. Patient states she has an ulcer which stress makes it worse and she will drink lemon grass tea to help stomach pain. Patient also states she has been having headaches.   Patient states next month she will not be getting food stamps, but has a back up plan for getting food from pantries including [employer].  Patient states her daughter gave her a deep fryer and she cooks chicken in it.   Patient states she tries to encourage her daughter to get more exercise and tells her that even though she gets out of breath walking 3 or 4 blocks, she still does it some times. Patient reports she uses her exercise machine at home every other day for 5 min and resistance bands every night.  Preferred Learning Style:   No preference indicated   Learning Readiness:   Change in progress  MEDICATIONS: reviewed   DIETARY INTAKE:   Avoided foods include pork and tuna fish.  24-hr recall:  B ( AM): cornflakes OR banana muffin Snk ( AM): none L ( PM): none (wasn't feeling good yesterday, was alseep) day before had grilled chicken salad Snk ( PM): none D ( PM): manwich and fries, sweet tea (diet?) Snk ( PM): sugar free jello  Beverages: water, diet tea   Usual physical activity: see note above  Estimated energy needs: 1600 calories 180 g carbohydrates 120 g protein 44 g fat  Progress Towards Goal(s):  In progress. Pt weight stabilized, appears to be eating less sweet baked goods.   Nutritional Diagnosis:  [place]-3.3  Overweight/obesity As related to hx of energy dense food choices and sugary beverages.  As evidenced by BMI of 35.9    Intervention:  Nutrition counseling. Discussed ways to make healthy meals using food typically received from food pantries. Discussed healthier cooking methods.   Consider eating beans on a regular basis.  Think about using the toaster oven or Sanjuana Letters rather than the deep fryer for your meat.  Try to eat balanced meals, consider having peanuts with your breakfast so you can have protein in the morning.  If you have a doctor's appointment before our next visit, ask to have your blood sugar checked and bring that blood sugar number to your next visit with me.  Teaching Method Utilized:  Visual Auditory  Handouts given during visit include:  none  Barriers to learning/adherence to lifestyle change: limited finances and reduced mobility due to effects of stroke  Demonstrated degree of understanding via:  Teach Back   Monitoring/Evaluation:  Dietary intake, exercise, and body weight in 3 month(s).

## 2018-01-02 DIAGNOSIS — I10 Essential (primary) hypertension: Secondary | ICD-10-CM | POA: Diagnosis not present

## 2018-01-02 DIAGNOSIS — I6789 Other cerebrovascular disease: Secondary | ICD-10-CM | POA: Diagnosis not present

## 2018-01-02 DIAGNOSIS — I679 Cerebrovascular disease, unspecified: Secondary | ICD-10-CM | POA: Diagnosis not present

## 2018-01-02 DIAGNOSIS — E118 Type 2 diabetes mellitus with unspecified complications: Secondary | ICD-10-CM | POA: Diagnosis not present

## 2018-01-12 ENCOUNTER — Ambulatory Visit: Payer: Medicare Other | Admitting: Registered"

## 2018-01-18 DIAGNOSIS — I10 Essential (primary) hypertension: Secondary | ICD-10-CM | POA: Diagnosis not present

## 2018-01-18 DIAGNOSIS — J399 Disease of upper respiratory tract, unspecified: Secondary | ICD-10-CM | POA: Diagnosis not present

## 2018-01-19 ENCOUNTER — Encounter: Payer: Medicare Other | Attending: Family Medicine | Admitting: Registered"

## 2018-01-19 DIAGNOSIS — Z713 Dietary counseling and surveillance: Secondary | ICD-10-CM | POA: Diagnosis not present

## 2018-01-19 DIAGNOSIS — R7303 Prediabetes: Secondary | ICD-10-CM | POA: Insufficient documentation

## 2018-01-19 NOTE — Progress Notes (Signed)
Medical Nutrition Therapy:  Appt start time: 1140 end time:  1210  Assessment:  Patient is here for follow-up for weight management. Patient states she was 202 lbs at Dr. Fransico Setters office yesterday approx 4-5 lbs weight loss in ~4 months. Pt reports her doctor wants her to lose 2-3 more lbs before her next visit there. Pt states these nutrition counseling visits help her stay on track.  Pt states she has been eating less at lunch than she used to and has felt satisfied without getting seconds. Pt states her son bought a box of [employer] cookies and it lasted 3 weeks. Pt reports she no longer feels the need to have sweets every day. In addition to other herbal teas, pt states she now drinks tumeric tea.  Patient states she finds she has started doing vigourous arm chair exercises while watching TV.  Pt states her daughter is on weight loss medication and doesn't get much exercise and is concerned about her daughters lack of effort to get active and lose weight. RD encouraged patient to continue being a good role model by staying active and eating well and paying attention to fullness cues. Compared to previous visits, today patient was less focused on other people's problems and appeared to be less stressed.   Preferred Learning Style:   No preference indicated   Learning Readiness:   Change in progress  MEDICATIONS: reviewed   DIETARY INTAKE:   Avoided foods include pork and tuna fish.  24-hr recall:  B ( AM): cereal, water OR oatmeal, applesauce Snk ( AM): none or nuts L ( PM): PBJ sandwich, non-fat milk Snk ( PM): none OR 2 fig newtons, yogurt D ( PM): sweet peas, mac & cheese, chicken strips Snk ( PM): none  Beverages: water, diet tea   Usual physical activity: daily Arm chair exercises, resistance bands  Estimated energy needs: 1600 calories 180 g carbohydrates 120 g protein 44 g fat  Progress Towards Goal(s):  In progress. Pt weight stabilized, appears to be eating less  sweet baked goods.   Nutritional Diagnosis:  [place]-3.3 Overweight/obesity As related to hx of energy dense food choices and sugary beverages.  As evidenced by BMI of 35.9    Intervention:  Nutrition counseling. Discussed getting more vegetables and beans in diet. Reviewed, reinforced healthier cooking methods. Discussed concentrating on own behavior to be a role model for family members.  Consider getting humus for snacks Consider eating a few more veggies during the day. Snap peas are delicious raw and go with a lot of different foods.  Aim to get more beans diet Keep using the toaster oven or Sanjuana Letters to prepare meat. Keep up the good work staying active in your chair while watching TV  Teaching Method Utilized:  Ship broker  Handouts given during visit include:  none  Barriers to learning/adherence to lifestyle change: limited finances and reduced mobility due to effects of stroke  Demonstrated degree of understanding via:  Teach Back   Monitoring/Evaluation:  Dietary intake, exercise, and body weight in 1 month(s).

## 2018-01-19 NOTE — Patient Instructions (Addendum)
Consider getting humus for snacks Consider eating a few more veggies during the day. Snap peas are delicious raw and go with a lot of different foods.  Aim to get more beans diet Keep using the toaster oven or [employer] to prepare meat. Keep up the good work staying active in your chair while watching TV

## 2018-01-20 ENCOUNTER — Ambulatory Visit: Payer: Self-pay | Admitting: *Deleted

## 2018-01-20 NOTE — Telephone Encounter (Signed)
Pt called stating that she would like to talk to a psychiatrist because she feels like the weight of the world is on her shoulders; unable to complete nurse triage because able to connect pt with Crisis Hotline [phone] and pt connected to [facility]; pt also given the number to [facility] [phone]  Answer Assessment - Initial Assessment Questions 1. CONCERN: "What happened that made you call today?"     Wants to talk to a psychiatrist 2. DEPRESSION SYMPTOM SCREENING: "How are you feeling overall?" (e.g., decreased energy, increased sleeping or difficulty sleeping, difficulty concentrating, feelings of sadness, guilt, hopelessness, or worthlessness)     depression 3. RISK OF HARM - SUICIDAL IDEATION:  "Do you ever have thoughts of hurting or killing yourself?"  (e.g., yes, no, no but preoccupation with thoughts about death)   - INTENT:  "Do you have thoughts of hurting or killing yourself right NOW?" (e.g., yes, no, N/A)   - PLAN: "Do you have a specific plan for how you would do this?" (e.g., gun, knife, overdose, no plan, N/A)     no 4. RISK OF HARM - HOMICIDAL IDEATION:  "Do you ever have thoughts of hurting or killing someone else?"  (e.g., yes, no, no but preoccupation with thoughts about death)   - INTENT:  "Do you have thoughts of hurting or killing someone right NOW?" (e.g., yes, no, N/A)   - PLAN: "Do you have a specific plan for how you would do this?" (e.g., gun, knife, no plan, N/A)      no 5. FUNCTIONAL IMPAIRMENT: "How have things been going for you overall in your life? Have you had any more difficulties than usual doing your normal daily activities?"  (e.g., better, same, worse; self-care, school, work, interactions)     unknown 6. SUPPORT: "Who is with you now?" "Who do you live with?" "Do you have family or friends nearby who you can talk to?"      children 7. THERAPIST: "Do you have a counselor or therapist? Name?"     no 8. STRESSORS: "Has there been any new stress  or recent changes in your life?"     no 9. DRUG ABUSE/ALCOHOL: "Do you drink alcohol or use any illegal drugs?"      no 10. OTHER: "Do you have any other health or medical symptoms right now?" (e.g., fever)       no 11. PREGNANCY: "Is there any chance you are pregnant?" "When was your last menstrual period?"       no  Protocols used: DEPRESSION-A-AH

## 2018-02-14 ENCOUNTER — Encounter: Payer: Medicare Other | Attending: Family Medicine | Admitting: Registered"

## 2018-02-14 DIAGNOSIS — E782 Mixed hyperlipidemia: Secondary | ICD-10-CM | POA: Insufficient documentation

## 2018-02-14 DIAGNOSIS — Z6832 Body mass index (BMI) 32.0-32.9, adult: Secondary | ICD-10-CM | POA: Insufficient documentation

## 2018-02-14 DIAGNOSIS — I119 Hypertensive heart disease without heart failure: Secondary | ICD-10-CM | POA: Insufficient documentation

## 2018-02-14 DIAGNOSIS — Z713 Dietary counseling and surveillance: Secondary | ICD-10-CM | POA: Insufficient documentation

## 2018-02-14 DIAGNOSIS — E669 Obesity, unspecified: Secondary | ICD-10-CM | POA: Diagnosis not present

## 2018-02-14 NOTE — Progress Notes (Signed)
Medical Nutrition Therapy:  Appt start time: 1410 end time:  1540  Assessment:  Patient is here for follow-up for weight management. Patient states last month her weight was 202 lbs at doctor's visit. Today weight is 201.6 lbs. Patient states she wants to be ~160 lbs.  Patient states she keeps receiving an offer in the mail for a motorized chair, but she doesn't think it will fit in her apartment. Patient states the reason she has considered getting it is because her back hurts after walking ~2 blocks to her rent office. Patient states she continues to do daily exercise and movement where she can sit or hold on to something due to stroke affecting balance.   Patient appears to have a lot of distress from interactions with her ex-husband who calls her often and berates her about her weight & using her cane to walk. Patient also states a lot of stress again due to death of mother and brother (not recent events), and finds it hard to turn her mind off. Patient states she still goes to a therapist who listens to her and helps somewhat. Patient states she wants to see a psychiatrist but cannot verbalize what she thinks would be the difference from her current treatment.  Pt states she bought the garlic humus which RD suggested last month, but wasn't sure what she should dip in it. Patient states she also bought some fresh strawberries. When RD suggested branching out a little more to include more vegetables to snack on, patient states she is waiting for her food stamps before going to the grocery store.  Preferred Learning Style:   No preference indicated   Learning Readiness:   Change in progress  MEDICATIONS: reviewed   DIETARY INTAKE:   Avoided foods include pork and tuna fish.  24-hr recall:  B ( AM): 2 boiled eggs, applesauce, toast Snk ( AM): blueberry multigrain bar L ( PM): PB&J sandwich, non-fat milk Snk ( PM): graham crackers D ( PM): breaded chicken, mixed veggies, stovetop  stuffing Snk ( PM): orange jello Beverages: water, fruit flavored water, coffee   Usual physical activity: daily Arm chair exercises, stretching, twisting, resistance bands  Estimated energy needs: 1600 calories 180 g carbohydrates 120 g protein 44 g fat  Progress Towards Goal(s):  In progress. Pt weight still stable, appears to continue improved eating habits.   Nutritional Diagnosis:  [place]-3.3 Overweight/obesity As related to hx of energy dense food choices and sugary beverages.  As evidenced by BMI of 35.9    Intervention:  Nutrition counseling. Discussed getting more vegetables in diet. Discussed value of taking naps.  Plan:  Nice to see you are branching out with different snacks, buying humus, having more fruit & berries.  Consider getting more veggies in your diet, ones that you mentioned are green beans, carrots, sweet peas, corn, collard greens.   Great job on changing you habit of snacking while watching TV.  Ask your therapist about how a psychiatrist may help you.  When you are sleepy from blood pressure medication go ahead and take a nap. Use an alarm, kitchen timer or have your daughter or neighbor call you if you are afraid of sleeping too long.  Teaching Method Utilized:  Visual Auditory  Handouts given during visit include:  none  Barriers to learning/adherence to lifestyle change: limited finances and reduced mobility due to effects of stroke  Demonstrated degree of understanding via:  Teach Back   Monitoring/Evaluation:  Dietary intake, exercise, and body weight  in 1 month(s).

## 2018-02-14 NOTE — Patient Instructions (Addendum)
   Nice to see you are branching out with different snacks, buying humus, having more fruit & berries.  Consider getting more veggies in your diet, ones that you mentioned are green beans, carrots, sweet peas, corn, collard greens.   Great job on changing you habit of snacking while watching TV.  Ask your therapist about how a psychiatrist may help you.  When you are sleepy from blood pressure medication go ahead and take a nap. Use an alarm, kitchen timer or have your daughter or neighbor call you if you are afraid of sleeping too long.

## 2018-02-16 ENCOUNTER — Ambulatory Visit: Payer: Medicare Other | Admitting: Registered"

## 2018-02-17 DIAGNOSIS — I10 Essential (primary) hypertension: Secondary | ICD-10-CM | POA: Diagnosis not present

## 2018-02-17 DIAGNOSIS — M62830 Muscle spasm of back: Secondary | ICD-10-CM | POA: Diagnosis not present

## 2018-02-17 DIAGNOSIS — Z0000 Encounter for general adult medical examination without abnormal findings: Secondary | ICD-10-CM | POA: Diagnosis not present

## 2018-03-20 ENCOUNTER — Encounter: Payer: Medicare Other | Attending: Family Medicine | Admitting: Registered"

## 2018-03-20 DIAGNOSIS — E669 Obesity, unspecified: Secondary | ICD-10-CM | POA: Insufficient documentation

## 2018-03-20 DIAGNOSIS — E782 Mixed hyperlipidemia: Secondary | ICD-10-CM | POA: Insufficient documentation

## 2018-03-20 DIAGNOSIS — Z713 Dietary counseling and surveillance: Secondary | ICD-10-CM | POA: Insufficient documentation

## 2018-03-20 DIAGNOSIS — I119 Hypertensive heart disease without heart failure: Secondary | ICD-10-CM | POA: Diagnosis not present

## 2018-03-20 DIAGNOSIS — Z6832 Body mass index (BMI) 32.0-32.9, adult: Secondary | ICD-10-CM | POA: Diagnosis not present

## 2018-03-20 NOTE — Progress Notes (Signed)
Medical Nutrition Therapy:  Appt start time: 1610 end time:  1435  Assessment:  Patient is here for follow-up for weight management. Patient weight is 203.2 lbs up 2.4 lbs from last visit.    Patient states she saw a HurryCane on TV that has 3 feet with suction cups that she wants to get to help her stability. Patient states her doctor won't send over the paperwork to [place] and doesn't know why. Patient states she has a quad cane, but couldn't verbalize why she is not using it instead of her standard cane. Patient said that her ex-husband givers her a hard time about using the cane and tells her it makes her hunched over.  Patient states she cannot walk very far without pain and being out of breath. Patient states she forgets to use her rollator which would allow her to sit and take breaks. Patient mentions walking to the rent office but not to pay rent. Patient states she rides with her ex-husband and they go together on errands to pay bills.  Patient states she gets tired during the day but her ex-husband and neighbor call her constantly so she can't take a nap. Patient states she doesn't want to turn off her phone just in case something happens with her grandchildren. RD believes being sleepy may be making her sugar cravings stronger.   Patient states she will be fasting during Ramadan and thinks that will make her slow down on eating chocolate. Patient states she bought grapes, chocolate pudding and apple sauce for snacks. Patient states she uses light ranch and when eating out the two small containers of dressing were not enough for her salad.  Preferred Learning Style:   No preference indicated   Learning Readiness:   Change in progress  MEDICATIONS: reviewed   DIETARY INTAKE:   Avoided foods include pork and tuna fish.  24-hr recall:  B ( AM):scrambles eggs, grits, whole wheat toast OR corn flakes Snk ( AM): grapes L ( PM): bologne sandwich, lettuce, salad dressing,  juice Snk ( PM): graham crackers D ( PM): baked chicken ranch, collard greens, mac cheese  Snk ( PM): low fat yogurt Beverages: water, occasionally coffee, tropical splash   Usual physical activity: continues to do daily arm chair exercises, stretching, twisting, resistance bands. 1x week walks a little  Estimated energy needs: 1600 calories 180 g carbohydrates 120 g protein 44 g fat  Progress Towards Goal(s):  In progress. Pt weight still stable, appears to continue improved eating habits.   Nutritional Diagnosis:  [place]-3.3 Overweight/obesity As related to hx of energy dense food choices and sugary beverages.  As evidenced by BMI of 35.9    Intervention:  Nutrition counseling. Discussed getting more vegetables in diet. Discussed value of taking naps.  Plan:  Consider walking more and use the rollator so you can sit and take a break.  Consider going back to playing your games instead of snacking while watching TV.  Good job on getting more veggies in your diet.  Continue having fruit, veggies, and yogurt for snacks.  Consider telling your ex-husband when you want to take a nap and turn off your phone.  Teaching Method Utilized:  Visual Auditory  Handouts given during visit include:  none  Barriers to learning/adherence to lifestyle change: limited finances and reduced mobility due to effects of stroke  Demonstrated degree of understanding via:  Teach Back   Monitoring/Evaluation:  Dietary intake, exercise, and body weight in 2 month(s).

## 2018-03-20 NOTE — Patient Instructions (Addendum)
   Consider walking more and use the rollator so you can sit and take a break.  Consider going back to playing your games instead of snacking while watching TV.  Good job on getting more veggies in your diet.  Continue having fruit, veggies, and yogurt for snacks.  Consider telling your ex-husband when you want to take a nap and turn off your phone.

## 2018-03-24 ENCOUNTER — Other Ambulatory Visit: Payer: Self-pay | Admitting: Adult Health

## 2018-03-24 DIAGNOSIS — I619 Nontraumatic intracerebral hemorrhage, unspecified: Secondary | ICD-10-CM

## 2018-03-24 DIAGNOSIS — G890 Central pain syndrome: Secondary | ICD-10-CM

## 2018-04-06 DIAGNOSIS — I10 Essential (primary) hypertension: Secondary | ICD-10-CM | POA: Diagnosis not present

## 2018-04-06 DIAGNOSIS — R51 Headache: Secondary | ICD-10-CM | POA: Diagnosis not present

## 2018-04-06 DIAGNOSIS — I69952 Hemiplegia and hemiparesis following unspecified cerebrovascular disease affecting left dominant side: Secondary | ICD-10-CM | POA: Diagnosis not present

## 2018-04-11 ENCOUNTER — Encounter: Payer: Self-pay | Admitting: Adult Health

## 2018-04-11 ENCOUNTER — Ambulatory Visit (INDEPENDENT_AMBULATORY_CARE_PROVIDER_SITE_OTHER): Payer: Medicare Other | Admitting: Adult Health

## 2018-04-11 VITALS — BP 136/64 | HR 76 | Ht 66.0 in | Wt 207.8 lb

## 2018-04-11 DIAGNOSIS — G4489 Other headache syndrome: Secondary | ICD-10-CM

## 2018-04-11 DIAGNOSIS — R42 Dizziness and giddiness: Secondary | ICD-10-CM

## 2018-04-11 DIAGNOSIS — G890 Central pain syndrome: Secondary | ICD-10-CM | POA: Diagnosis not present

## 2018-04-11 DIAGNOSIS — I619 Nontraumatic intracerebral hemorrhage, unspecified: Secondary | ICD-10-CM | POA: Diagnosis not present

## 2018-04-11 NOTE — Progress Notes (Addendum)
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  She returns today for follow-up.  She is currently taking gabapentin 800 mg twice a day.  She reports that this works well for her pain on the left side of the body.  She states that she does have a tremor with the left hand as significant.  She states that this is been present since her stroke.  She also reports that she has daily mild headaches.  She states that they occur across the forehead.  She has tried taking over-the-counter medication such as Tylenol but it offers her no benefit.  She denies photophobia, phonophobia, nausea or vomiting.  She reports that she discussed this with her primary care provider.  She states that they felt that this was related to stress.  The patient reports that she is under an excessive amount of stress dealing with family issues.  She reports that she is on Celexa for anxiety and depression.  She denies any excessive daytime sleepiness.  She reports that she is never been told that she snores but she is unsure.  She is also unsure about any witnessed apneic events.  She also reports that she is had dizzy events.  She is unsure if it occurs with position changes.  She states that they are usually very brief.  She is unable to give a lot of detail about the events.  She returns today for evaluation.   HISTORY Chelsea [name] is a 67 year old right-handed woman with an underlying complex medical history of hypertension, reflux disease, hyperactive bladder, arthritis with s/p L TKA in 2/16, hyperlipidemia, depression, and previous stroke in 2007 with residual left-sided weakness, aortic insufficiency, followed by cardiology, who presents for follow-up consultation of her left-sided hemibody pain, likely thalamic pain syndrome  in the context of right thalamic stroke. She is unaccompanied today. I last saw her on 09/09/2016, at which time she reporteddoing okay as far as the pain on the L side. She was seen by Dr. Aundra Dubin in cardiology on 04/20/2016.For her LBP, she had seen Dr. Jacelyn Grip and had received injections. L knee was okay, finished PT.  She saw Ward Givens in the interim on 03/10/2017, at which time she reported residual pain in her gabapentin was increased to 400 mg 3 times a day.  Today, 10/10/2017: She reports that she takes 2 pills of the 400 mg about once daily, not 400 mg tid. She feels, the 800 mg helps in one dose. No recent illness, but does report increase in stress lately. Both her ex-husbands have been sick, she recently reunited with her younger brother after about 40+ years.   The patient's allergies, current medications, family history, past medical history, past social history, past surgical history and problem list were reviewed and updated as appropriate.    Previously:  I saw her on 02/11/2016, at which time she reported that gabapentin was helping. She did have residual pain on the left side. She was noted to have an irregular pulse on my exam and denied any chest pain or shortness of breath, but reported that she herself had noted irregular heartbeat from time to time. She had not seen cardiology since 2014. She had an echocardiogram and EKG for surgical clearance recently.she underwent left total knee arthroplasty on 01/05/2016, under Dr.  Mayer Camel. She was coming along quite well after that, still was in physical therapy. Unfortunately, her pain medication had caused severe nausea and vomiting. She stopped taking narcotic pain medication in February 2017. I suggested she continue with gabapentin 300 mg 3 times a day.   I saw her on 08/14/2015, at which time she reported doing fairly well. She requested a letter so she can have help with her trash can as she was not able to move her trash  can by herself. She felt that the increase in gabapentin was helpful. She was not driving. She had ongoing left-sided weakness and left-sided involuntary movements but stable. She had gained some weight and was scheduled to see a nutritionist. I suggested she continue with gabapentin 300 mg 3 times a day.  I saw her on 03/10/2015, at which time she reported that the gabapentin was somewhat helpful. She had some residual pain. She was taking 300 mg each night. We gradually increased it to tid.   I first met her on 11/06/2014 at the request of her primary care physician, at which time the patient reported a several year history of left-sided pain in her face as well as hemibody. Symptoms started perhaps a few years after her stroke in 2007. I suggested symptomatic treatment with gabapentin. 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 I suggested she titrate gabapentin starting at 100 mg at night.  She reports left-sided pain in her face, as well as the left hemibody. Symptoms started gradually, but she does not know exactly when they started. It did not happen immediately after the stroke in 2007, perhaps a few years later. She has residual left-sided weakness and walks with a cane. She had therapy and home health therapy and was advised to use a cane. She describes a warmth sensation on the left side of her face and hemibody. This can be a burning pain as well at times. It is not always the same or constant. She has a tremor on the left side and difficulty with fine motor control in the left. In addition she has neck and lower back pain. She has seen a chiropractor for this. She had seen [facility] for this and her knee in the past. She's not able to pinpoint the degree of pain on the left side. Sometimes it is not actually a pain but an abnormal temperature sensation. It is not necessarily  debilitating but at times quite painful when it has the burning sensation to it. She lives alone. She has 3 grown children. Her grandson is currently visiting. She does not smoke. She is on a baby aspirin. Of note, she had a right thalamic hemorrhagic stroke. She had an MRI brain with and without contrast on 11/20/2008: 1.No evidence of acute ischemia. 2.Stable non specific supratentorial subcortical white matter changes most likely representing areas of ischemic gliosis due to small vessel disease related to hypertension or diabetes. 3.Interval near-complete resolution of the previous right thalamic hemorrhage. 4.Small focal area of enhancement within the epicenter of the hemorrhage, which probably represents contrast pooling.No evidence of a vascular abnormality however is suggested on the MRI examination.  She had a brain MRI with and without contrast and brain MRA without contrast on 04/22/2006:1.Right thalamic hematoma with surrounding mass effect secondary to vasogenic edema. 2.Mild mass effect on the inferior aspect of the third ventricle. 3.No imaging evidence of abnormal enhancement or blood vessel ending  from periphery of this hemorrhage.  4.Mild sinusitis changes.  1.No evidence of occlusion, stenosis, dissections or brain aneurysms noted on the images provided. 2.Aneurysms of 5 mm or less may not be seen on MRA examination.   I personally reviewed the images through the PACS system and also shared images with her on the computer.  Of note, she is not sure if she snores. She denies gasping sensations and has not been told that she has pauses in her breathing.    REVIEW OF SYSTEMS: Out of a complete 14 system review of symptoms, the patient complains only of the following symptoms, and all other reviewed systems are negative.  See HPI  ALLERGIES: Allergies  Allergen Reactions  . Other Swelling    Hair dye, caused eye swelling, multiple times experienced this  reaction    HOME MEDICATIONS: Outpatient Medications Prior to Visit  Medication Sig Dispense Refill  . amLODipine-valsartan (EXFORGE) 10-160 MG per tablet Take 1 tablet by mouth daily. 90 tablet 3  . aspirin EC 81 MG tablet Take 1 tablet (81 mg total) by mouth daily.    Marland Kitchen atorvastatin (LIPITOR) 10 MG tablet TAKE 1 TABLET(10 MG) BY MOUTH DAILY 90 tablet 3  . Calcium-Magnesium-Vitamin D (CALCIUM 500 PO) Take 1 tablet by mouth daily.     Marland Kitchen CARAFATE 1 GM/10ML suspension Take 10 mLs by mouth 4 (four) times daily.    . citalopram (CELEXA) 10 MG tablet Take 10 mg by mouth daily.    . cloNIDine (CATAPRES) 0.3 MG tablet TAKE 1 TABLET BY MOUTH TWICE DAILY 180 tablet 3  . cyanocobalamin 100 MCG tablet Take 100 mcg by mouth daily.     Marland Kitchen doxazosin (CARDURA) 8 MG tablet Take 8 mg by mouth at bedtime.    Marland Kitchen ezetimibe (ZETIA) 10 MG tablet Take 10 mg by mouth daily.    . furosemide (LASIX) 20 MG tablet Take 20 mg by mouth 2 (two) times daily.     Marland Kitchen gabapentin (NEURONTIN) 800 MG tablet Take 1 tablet (800 mg total) 2 (two) times daily by mouth. 60 tablet 5  . methocarbamol (ROBAXIN) 500 MG tablet 1 tablet q 6hr prn muscle spasm 60 tablet 1  . mirabegron ER (MYRBETRIQ) 25 MG TB24 tablet Take 25 mg by mouth daily.    . Multiple Vitamin (MULTIVITAMIN) tablet Take 1 tablet by mouth daily.      . pantoprazole (PROTONIX) 40 MG tablet Take 40 mg by mouth every evening.    Marland Kitchen TRAMADOL HCL ER PO Take by mouth.     No facility-administered medications prior to visit.     PAST MEDICAL HISTORY: Past Medical History:  Diagnosis Date  . Anxiety   . Cerebrovascular accident [facility])    a. 2007-->residual left sided weakness  . Chest pain   . Depression   . GERD (gastroesophageal reflux disease)   . Headache   . Hx of hysterectomy   . Hyperlipidemia   . Hypertension   . Mild to Moderate Aortic Insufficiency    a. 08/2014 Echo: EF 55-60%, mild conc LVH, no rwma, Gr 1 DD, mild-mod AI, mild MR.  . Morbid obesity  ([place])   . Obesity   . Peptic ulcer disease     PAST SURGICAL HISTORY: Past Surgical History:  Procedure Laterality Date  . ABDOMINAL HYSTERECTOMY    . TOTAL KNEE ARTHROPLASTY Left 01/05/2016   Procedure: TOTAL KNEE ARTHROPLASTY;  Surgeon: Frederik Pear, MD;  Location: [place];  Service: Orthopedics;  Laterality: Left;  .  TUBAL LIGATION      FAMILY HISTORY: Family History  Problem Relation Age of Onset  . Hypertension Mother        deceased  . Peripheral vascular disease Mother   . Cirrhosis Father        DeCEASED  . Heart attack Other        grandmother deceased  . Breast cancer Neg Hx     SOCIAL HISTORY: Social History   Socioeconomic History  . Marital status: Married    Spouse name: Not on file  . Number of children: 3  . Years of education: 86  . Highest education level: Not on file  Occupational History  . 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 Financial resource strain: Not on file  . Food insecurity:    Worry: Not on file    Inability: Not on file  . Transportation needs:    Medical: Not on file    Non-medical: Not on file  Tobacco Use  . Smoking status: Never Smoker  . Smokeless tobacco: Never Used  Substance and Sexual Activity  . Alcohol use: No    Alcohol/week: 0.0 oz  . Drug use: No  . Sexual activity: Not Currently  Lifestyle  . Physical activity:    Days per week: Not on file    Minutes per session: Not on file  . Stress: Not on file  Relationships  . Social connections:    Talks on phone: Not on file    Gets together: Not on file    Attends religious service: Not on file    Active member of club or organization: Not on file    Attends meetings of clubs or organizations: Not on file    Relationship status: Not on file  . Intimate partner violence:    Fear of current or ex partner: Not on file    Emotionally abused: Not on file    Physically abused: Not on file    Forced  sexual activity: Not on file  Other Topics Concern  . Not on file  Social History Narrative   Patient consumes no caffeine      PHYSICAL EXAM  Vitals:   04/11/18 1342  BP: 136/64  Pulse: 76  Weight: 207 lb 12.8 oz (94.3 kg)  Height: _0  (1.676 m)   Body mass index is 33.54 kg/m.  Generalized: Well developed, in no acute distress   Neurological examination  Mentation: Alert oriented to time, place, history taking. Follows all commands speech and language fluent Cranial nerve II-XII: Pupils were equal round reactive to light. Extraocular movements were full, visual field were full on confrontational test. Facial sensation and strength were normal. Uvula tongue midline. Head turning and shoulder shrug  were normal and symmetric. Motor: The motor testing reveals 5 over 5 strength in RUE and RLE. 4/5 in the LUE and LLE. Good symmetric motor tone is noted throughout.  Sensory: Sensory testing is intact to soft touch on all 4 extremities. No evidence of extinction is noted.  Coordination: Cerebellar testing reveals good finger-nose-finger on the right.  Coarse tremor noted on the left.   Good heel-to-shin bilaterally.  Gait and station: Patient has a slight limp on the left.  She uses a cane when ambulating. Reflexes: Deep tendon reflexes are symmetric and normal bilaterally.   DIAGNOSTIC DATA (LABS, IMAGING, TESTING) - I reviewed patient records, labs, notes, testing and imaging  myself where available.  Lab Results  Component Value Date   WBC 4.0 08/09/2016   HGB 14.6 08/09/2016   HCT 43.0 08/09/2016   MCV 89.8 08/09/2016   PLT 237 08/09/2016      Component Value Date/Time   NA 145 08/09/2016 1603   K 3.5 08/09/2016 1603   CL 103 08/09/2016 1603   CO2 28 04/20/2016 1409   GLUCOSE 89 08/09/2016 1603   BUN 14 08/09/2016 1603   CREATININE 1.20 (H) 08/09/2016 1603   CREATININE 1.11 (H) 04/20/2016 1409   CALCIUM 9.4 04/20/2016 1409   PROT 7.2 06/13/2013 1045   ALBUMIN  3.6 06/13/2013 1045   AST 19 06/13/2013 1045   ALT 17 06/13/2013 1045   ALKPHOS 93 06/13/2013 1045   BILITOT 0.3 06/13/2013 1045   GFRNONAA 42 (L) 01/06/2016 0312   GFRAA 49 (L) 01/06/2016 0312   Lab Results  Component Value Date   CHOL 134 04/20/2016   HDL 54 04/20/2016   LDLCALC 60 04/20/2016   TRIG 99 04/20/2016   CHOLHDL 2.5 04/20/2016        ASSESSMENT AND PLAN 67 y.o. year old female  has a past medical history of Anxiety, Cerebrovascular accident ([place]), Chest pain, Depression, GERD (gastroesophageal reflux disease), Headache, hysterectomy, Hyperlipidemia, Hypertension, Mild to Moderate Aortic Insufficiency, Morbid obesity ([place]), Obesity, and Peptic ulcer disease. here with:  1. Thalamic pain syndrome 2.  History of thalamic hemorrhage 3. Dizziness 4. Headaches   The patient will continue on gabapentin 800 mg twice a day.  The patient does sound as if she is having headaches related to stress.  We discussed ways to reduce her stress level.  I also advised that she should keep a journal with her dizzy episodes.  If she notices a pattern she should let us know.  She has an appointment with her cardiologist tomorrow.  She will follow-up in 6 months or sooner if needed.  Ward Givens, MSN, NP-C 04/11/2018, 2:15 PM [facility] [address], [place], [place] [address] [phone]  I reviewed the above note and documentation by the Nurse Practitioner and agree with the history, physical exam, assessment and plan as outlined above. I was immediately available for face-to-face consultation. Star Age, MD, PhD [facility])

## 2018-04-11 NOTE — Patient Instructions (Signed)
Your Plan:  Continue gabapentin  Keep journal of dizzy episodes If your symptoms worsen or you develop new symptoms please let us know.   Thank you for coming to see [place] at [facility]. I hope we have been able to provide you high quality care today.  You may receive a patient satisfaction survey over the next few weeks. We would appreciate your feedback and comments so that we may continue to improve ourselves and the health of our patients.

## 2018-04-12 ENCOUNTER — Ambulatory Visit (INDEPENDENT_AMBULATORY_CARE_PROVIDER_SITE_OTHER): Payer: Medicare Other | Admitting: Interventional Cardiology

## 2018-04-12 ENCOUNTER — Encounter: Payer: Self-pay | Admitting: Interventional Cardiology

## 2018-04-12 VITALS — BP 120/82 | HR 66 | Ht 66.0 in | Wt 207.8 lb

## 2018-04-12 DIAGNOSIS — I10 Essential (primary) hypertension: Secondary | ICD-10-CM

## 2018-04-12 DIAGNOSIS — E7849 Other hyperlipidemia: Secondary | ICD-10-CM

## 2018-04-12 DIAGNOSIS — I351 Nonrheumatic aortic (valve) insufficiency: Secondary | ICD-10-CM | POA: Diagnosis not present

## 2018-04-12 NOTE — Progress Notes (Signed)
Cardiology Office Note    Date:  04/12/2018   ID:  Chelsea [name], DOB 05-12-1951, MRN [id number]  PCP:  Lucianne Lei, MD  Cardiologist: Sinclair Grooms, MD   Chief Complaint  Patient presents with  . Cardiac Valve Problem    History of Present Illness:  Chelsea [name] is a 67 y.o. female with history of mild aortic regurgitation, non-cardiac chest pain, prior history of stroke with residual left-sided weakness, hyperlipidemia, hypertension, and moderate obesity.  She is doing well.  She denies chest pain, syncope, orthopnea, lower extremity swelling, and claudication.  No episodes of palpitation or other cardiac complaints.  She did express her understanding of the underlying cardiac condition being a "hole in the heart".  We again discussed that she has mild aortic valve disease with regurgitation that may turn into stenosis.  Also has a mild left ventricular hypertrophy and high blood pressure.  Past Medical History:  Diagnosis Date  . Anxiety   . Cerebrovascular accident [facility])    a. 2007-->residual left sided weakness  . Chest pain   . Depression   . GERD (gastroesophageal reflux disease)   . Headache   . Hx of hysterectomy   . Hyperlipidemia   . Hypertension   . Mild to Moderate Aortic Insufficiency    a. 08/2014 Echo: EF 55-60%, mild conc LVH, no rwma, Gr 1 DD, mild-mod AI, mild MR.  . Morbid obesity ([place])   . Obesity   . Peptic ulcer disease     Past Surgical History:  Procedure Laterality Date  . ABDOMINAL HYSTERECTOMY    . TOTAL KNEE ARTHROPLASTY Left 01/05/2016   Procedure: TOTAL KNEE ARTHROPLASTY;  Surgeon: Frederik Pear, MD;  Location: [facility];  Service: Orthopedics;  Laterality: Left;  . TUBAL LIGATION      Current Medications: Outpatient Medications Prior to Visit  Medication Sig Dispense Refill  . amLODipine-valsartan (EXFORGE) 10-160 MG per tablet Take 1 tablet by mouth daily. 90 tablet 3  . aspirin EC 81 MG tablet Take 1 tablet (81 mg total) by mouth  daily.    Marland Kitchen atorvastatin (LIPITOR) 10 MG tablet TAKE 1 TABLET(10 MG) BY MOUTH DAILY 90 tablet 3  . Calcium-Magnesium-Vitamin D (CALCIUM 500 PO) Take 1 tablet by mouth daily.     Marland Kitchen CARAFATE 1 GM/10ML suspension Take 10 mLs by mouth 4 (four) times daily.    . citalopram (CELEXA) 10 MG tablet Take 10 mg by mouth daily.    . cloNIDine (CATAPRES) 0.3 MG tablet TAKE 1 TABLET BY MOUTH TWICE DAILY 180 tablet 3  . cyanocobalamin 100 MCG tablet Take 100 mcg by mouth daily.     Marland Kitchen doxazosin (CARDURA) 8 MG tablet Take 8 mg by mouth at bedtime.    Marland Kitchen ezetimibe (ZETIA) 10 MG tablet Take 10 mg by mouth daily.    . furosemide (LASIX) 20 MG tablet Take 20 mg by mouth 2 (two) times daily.     Marland Kitchen gabapentin (NEURONTIN) 800 MG tablet Take 1 tablet (800 mg total) 2 (two) times daily by mouth. 60 tablet 5  . methocarbamol (ROBAXIN) 500 MG tablet 1 tablet q 6hr prn muscle spasm 60 tablet 1  . mirabegron ER (MYRBETRIQ) 25 MG TB24 tablet Take 25 mg by mouth daily.    . Multiple Vitamin (MULTIVITAMIN) tablet Take 1 tablet by mouth daily.      . pantoprazole (PROTONIX) 40 MG tablet Take 40 mg by mouth 2 (two) times daily.     Marland Kitchen  TRAMADOL HCL ER PO Take 1 tablet by mouth every 6 (six) hours as needed (PAIN).      No facility-administered medications prior to visit.      Allergies:   Other   Social History   Socioeconomic History  . Marital status: Married    Spouse name: Not on file  . Number of children: 3  . Years of education: 73  . Highest education level: Not on file  Occupational History  . 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 Financial resource strain: Not on file  . Food insecurity:    Worry: Not on file    Inability: Not on file  . Transportation needs:    Medical: Not on file    Non-medical: Not on file  Tobacco Use  . Smoking status: Current Every Day Smoker  . Smokeless tobacco: Never Used  Substance and Sexual Activity    . Alcohol use: No    Alcohol/week: 0.0 oz  . Drug use: No  . Sexual activity: Not Currently  Lifestyle  . Physical activity:    Days per week: Not on file    Minutes per session: Not on file  . Stress: Not on file  Relationships  . Social connections:    Talks on phone: Not on file    Gets together: Not on file    Attends religious service: Not on file    Active member of club or organization: Not on file    Attends meetings of clubs or organizations: Not on file    Relationship status: Not on file  Other Topics Concern  . Not on file  Social History Narrative   Patient consumes no caffeine     Family History:  The patient's family history includes Cirrhosis in her father; Heart attack in her other; Hypertension in her mother; Peripheral vascular disease in her mother.   ROS:   Please see the history of present illness.    Back discomfort, left-sided weakness from prior stroke, dizziness, headache, stress, unexplained weight gain.  Having some domestic issues with her ex-husband trying to come back into her life.  This is caused her to have quite high stress related to his A. fib.  She has a grandchildren. All other systems reviewed and are negative.   PHYSICAL EXAM:   VS:  BP 120/82   Pulse 66   Ht 5\' 6"  (1.676 m)   Wt 207 lb 12.8 oz (94.3 kg)   BMI 33.54 kg/m    GEN: Well nourished, well developed, in no acute distress  HEENT: normal  Neck: no JVD, carotid bruits, or masses Cardiac: RRR; 1/6 to 2/6 systolic murmur and no diastolic murmur.  No rubs, or gallops,no edema. Respiratory:  clear to auscultation bilaterally, normal work of breathing GI: soft, nontender, nondistended, + BS MS: no deformity or atrophy  Skin: warm and dry, no rash Neuro:  Alert and Oriented x 3, Strength and sensation are intact Psych: euthymic mood, full affect  Wt Readings from Last 3 Encounters:  04/12/18 207 lb 12.8 oz (94.3 kg)  04/11/18 207 lb 12.8 oz (94.3 kg)  03/20/18 203 lb 3.2  oz (92.2 kg)      Studies/Labs Reviewed:   EKG:  EKG sinus rhythm, left atrial abnormality, left axis deviation, LVH.  Recent Labs: No results found for requested labs within last 8760 hours.   Lipid Panel    Component Value  Date/Time   CHOL 134 04/20/2016 1409   TRIG 99 04/20/2016 1409   HDL 54 04/20/2016 1409   CHOLHDL 2.5 04/20/2016 1409   VLDL 20 04/20/2016 1409   LDLCALC 60 04/20/2016 1409    Additional studies/ records that were reviewed today include:  2D Doppler echocardiogram February 2017:  Study Conclusions   - Left ventricle: The cavity size was mildly dilated. Wall   thickness was increased in a pattern of mild LVH. Systolic   function was normal. The estimated ejection fraction was in the   range of 55% to 60%. Wall motion was normal; there were no   regional wall motion abnormalities. Doppler parameters are   consistent with abnormal left ventricular relaxation (grade 1   diastolic dysfunction). Doppler parameters are consistent with   elevated ventricular end-diastolic filling pressure. - Aortic valve: There was mild regurgitation. Mean gradient (S): 7   mm Hg. Peak gradient (S): 15 mm Hg. Valve area (VTI): 2.63 cm^2.   Valve area (Vmax): 2.45 cm^2. Valve area (Vmean): 2.75 cm^2. - Mitral valve: There was mild regurgitation. - Left atrium: The atrium was mildly dilated. - Atrial septum: No defect or patent foramen ovale was identified.   ASSESSMENT:    1. Nonrheumatic aortic valve insufficiency   2. Other hyperlipidemia   3. Essential hypertension   4. Morbid obesity ([place])      PLAN:  In order of problems listed above:  1. No significant diastolic murmur.  1/6 systolic murmur is heard.  Clinically there is no significant change in aortic valve disease.  Plan clinical follow-up in 1 year with an echocardiogram planned/interpreted prior to that office visit.  Notify us of dyspnea, lower extremity swelling, or chest pain.  Needs 2D Doppler  echocardiogram to follow-up both LVH and whether there is progression of aortic valve disease. 2. LDL target should be less than 100 and preferably less than 70 since she has multiple risk factors including age, smoking, and hypertension. 3. Low-salt diet.  Target blood pressure 130/80.  Discontinue tobacco use. 4. Decrease caloric intake.  Clinical follow-up in 1 year with echocardiogram prior to the office visit.    Medication Adjustments/Labs and Tests Ordered: Current medicines are reviewed at length with the patient today.  Concerns regarding medicines are outlined above.  Medication changes, Labs and Tests ordered today are listed in the Patient Instructions below. Patient Instructions  Medication Instructions:  Your physician recommends that you continue on your current medications as directed. Please refer to the Current Medication list given to you today.   Labwork: None   Testing/Procedures: Your physician has requested that you have an echocardiogram prior to your 1 year appointment. Echocardiography is a painless test that uses sound waves to create images of your heart. It provides your doctor with information about the size and shape of your heart and how well your heart's chambers and valves are working. This procedure takes approximately one hour. There are no restrictions for this procedure.    Follow-Up: Your physician wants you to follow-up in 1 year with Dr. Tamala Julian. You will receive a reminder letter in the mail two months in advance. If you don't receive a letter, please call our office to schedule the follow-up appointment.   Any Other Special Instructions Will Be Listed Below (If Applicable).   Steps to Quit Smoking Smoking tobacco can be bad for your health. It can also affect almost every organ in your body. Smoking puts you and people around you at risk  for many serious long-lasting (chronic) diseases. Quitting smoking is hard, but it is one of the best things  that you can do for your health. It is never too late to quit. What are the benefits of quitting smoking? When you quit smoking, you lower your risk for getting serious diseases and conditions. They can include:  Lung cancer or lung disease.  Heart disease.  Stroke.  Heart attack.  Not being able to have children (infertility).  Weak bones (osteoporosis) and broken bones (fractures).  If you have coughing, wheezing, and shortness of breath, those symptoms may get better when you quit. You may also get sick less often. If you are pregnant, quitting smoking can help to lower your chances of having a baby of low birth weight. What can I do to help me quit smoking? Talk with your doctor about what can help you quit smoking. Some things you can do (strategies) include:  Quitting smoking totally, instead of slowly cutting back how much you smoke over a period of time.  Going to in-person counseling. You are more likely to quit if you go to many counseling sessions.  Using resources and support systems, such as: ? Database administrator with a Social worker. ? Phone quitlines. ? Careers information officer. ? Support groups or group counseling. ? Text messaging programs. ? Mobile phone apps or applications.  Taking medicines. Some of these medicines may have nicotine in them. If you are pregnant or breastfeeding, do not take any medicines to quit smoking unless your doctor says it is okay. Talk with your doctor about counseling or other things that can help you.  Talk with your doctor about using more than one strategy at the same time, such as taking medicines while you are also going to in-person counseling. This can help make quitting easier. What things can I do to make it easier to quit? Quitting smoking might feel very hard at first, but there is a lot that you can do to make it easier. Take these steps:  Talk to your family and friends. Ask them to support and encourage you.  Call phone  quitlines, reach out to support groups, or work with a Social worker.  Ask people who smoke to not smoke around you.  Avoid places that make you want (trigger) to smoke, such as: ? Bars. ? Parties. ? Smoke-break areas at work.  Spend time with people who do not smoke.  Lower the stress in your life. Stress can make you want to smoke. Try these things to help your stress: ? Getting regular exercise. ? Deep-breathing exercises. ? Yoga. ? Meditating. ? Doing a body scan. To do this, close your eyes, focus on one area of your body at a time from head to toe, and notice which parts of your body are tense. Try to relax the muscles in those areas.  Download or buy apps on your mobile phone or tablet that can help you stick to your quit plan. There are many free apps, such as QuitGuide from the [employer] Office manager for Disease Control and Prevention). You can find more support from smokefree.gov and other websites.  This information is not intended to replace advice given to you by your health care provider. Make sure you discuss any questions you have with your health care provider. Document Released: 09/11/2009 Document Revised: 07/13/2016 Document Reviewed: 04/01/2015 Elsevier Interactive Patient Education  [employer].    If you need a refill on your cardiac medications before your next appointment, please  call your pharmacy.      Signed, Sinclair Grooms, MD  04/12/2018 1:45 PM    [facility] [address], [place], [place]  [address] Phone: [phone]; Fax: [phone]

## 2018-04-12 NOTE — Patient Instructions (Addendum)
Medication Instructions:  Your physician recommends that you continue on your current medications as directed. Please refer to the Current Medication list given to you today.   Labwork: None   Testing/Procedures: Your physician has requested that you have an echocardiogram prior to your 1 year appointment. Echocardiography is a painless test that uses sound waves to create images of your heart. It provides your doctor with information about the size and shape of your heart and how well your heart's chambers and valves are working. This procedure takes approximately one hour. There are no restrictions for this procedure.    Follow-Up: Your physician wants you to follow-up in 1 year with Dr. Tamala Julian. You will receive a reminder letter in the mail two months in advance. If you don't receive a letter, please call our office to schedule the follow-up appointment.   Any Other Special Instructions Will Be Listed Below (If Applicable).   Steps to Quit Smoking Smoking tobacco can be bad for your health. It can also affect almost every organ in your body. Smoking puts you and people around you at risk for many serious long-lasting (chronic) diseases. Quitting smoking is hard, but it is one of the best things that you can do for your health. It is never too late to quit. What are the benefits of quitting smoking? When you quit smoking, you lower your risk for getting serious diseases and conditions. They can include:  Lung cancer or lung disease.  Heart disease.  Stroke.  Heart attack.  Not being able to have children (infertility).  Weak bones (osteoporosis) and broken bones (fractures).  If you have coughing, wheezing, and shortness of breath, those symptoms may get better when you quit. You may also get sick less often. If you are pregnant, quitting smoking can help to lower your chances of having a baby of low birth weight. What can I do to help me quit smoking? Talk with your doctor  about what can help you quit smoking. Some things you can do (strategies) include:  Quitting smoking totally, instead of slowly cutting back how much you smoke over a period of time.  Going to in-person counseling. You are more likely to quit if you go to many counseling sessions.  Using resources and support systems, such as: ? Database administrator with a Social worker. ? Phone quitlines. ? Careers information officer. ? Support groups or group counseling. ? Text messaging programs. ? Mobile phone apps or applications.  Taking medicines. Some of these medicines may have nicotine in them. If you are pregnant or breastfeeding, do not take any medicines to quit smoking unless your doctor says it is okay. Talk with your doctor about counseling or other things that can help you.  Talk with your doctor about using more than one strategy at the same time, such as taking medicines while you are also going to in-person counseling. This can help make quitting easier. What things can I do to make it easier to quit? Quitting smoking might feel very hard at first, but there is a lot that you can do to make it easier. Take these steps:  Talk to your family and friends. Ask them to support and encourage you.  Call phone quitlines, reach out to support groups, or work with a Social worker.  Ask people who smoke to not smoke around you.  Avoid places that make you want (trigger) to smoke, such as: ? Bars. ? Parties. ? Smoke-break areas at work.  Spend time with people who  do not smoke.  Lower the stress in your life. Stress can make you want to smoke. Try these things to help your stress: ? Getting regular exercise. ? Deep-breathing exercises. ? Yoga. ? Meditating. ? Doing a body scan. To do this, close your eyes, focus on one area of your body at a time from head to toe, and notice which parts of your body are tense. Try to relax the muscles in those areas.  Download or buy apps on your mobile phone or  tablet that can help you stick to your quit plan. There are many free apps, such as QuitGuide from the [employer] Office manager for Disease Control and Prevention). You can find more support from smokefree.gov and other websites.  This information is not intended to replace advice given to you by your health care provider. Make sure you discuss any questions you have with your health care provider. Document Released: 09/11/2009 Document Revised: 07/13/2016 Document Reviewed: 04/01/2015 Elsevier Interactive Patient Education  [employer].    If you need a refill on your cardiac medications before your next appointment, please call your pharmacy.

## 2018-04-20 ENCOUNTER — Other Ambulatory Visit: Payer: Self-pay | Admitting: Interventional Cardiology

## 2018-05-03 ENCOUNTER — Ambulatory Visit (INDEPENDENT_AMBULATORY_CARE_PROVIDER_SITE_OTHER): Payer: Medicare Other

## 2018-05-03 ENCOUNTER — Ambulatory Visit (INDEPENDENT_AMBULATORY_CARE_PROVIDER_SITE_OTHER): Payer: Medicare Other | Admitting: Podiatry

## 2018-05-03 ENCOUNTER — Encounter: Payer: Self-pay | Admitting: Podiatry

## 2018-05-03 DIAGNOSIS — M779 Enthesopathy, unspecified: Secondary | ICD-10-CM

## 2018-05-03 DIAGNOSIS — M21372 Foot drop, left foot: Secondary | ICD-10-CM | POA: Diagnosis not present

## 2018-05-03 DIAGNOSIS — Z8673 Personal history of transient ischemic attack (TIA), and cerebral infarction without residual deficits: Secondary | ICD-10-CM

## 2018-05-07 NOTE — Progress Notes (Signed)
   HPI: 67 year old female presenting today as a new patient with a chief complaint of her left foot turning inwards. She also reports a nodule to the dorsum of the left foot. She reports h/o left sided stroke in 2007 and has had these symptoms since. She has been elevating the foot with no significant relief. She denies pain or swelling. Patient is here for further evaluation and treatment.   Past Medical History:  Diagnosis Date  . Anxiety   . Cerebrovascular accident [facility])    a. 2007-->residual left sided weakness  . Chest pain   . Depression   . GERD (gastroesophageal reflux disease)   . Headache   . Hx of hysterectomy   . Hyperlipidemia   . Hypertension   . Mild to Moderate Aortic Insufficiency    a. 08/2014 Echo: EF 55-60%, mild conc LVH, no rwma, Gr 1 DD, mild-mod AI, mild MR.  . Morbid obesity ([place])   . Obesity   . Peptic ulcer disease      Physical Exam: General: The patient is alert and oriented x3 in no acute distress.  Dermatology: Skin is warm, dry and supple bilateral lower extremities. Negative for open lesions or macerations.  Vascular: Palpable pedal pulses bilaterally. No edema or erythema noted. Capillary refill within normal limits.  Neurological: Epicritic and protective threshold grossly intact bilaterally.   Musculoskeletal Exam: Muscle strength 4/5 in all muscle groups of the left lower extremity. Decreased forced pronation of the left foot.    Radiographic Exam:  Normal osseous mineralization. Joint spaces preserved. No fracture/dislocation/boney destruction.    Assessment: 1. H/o left-sided stroke 2007 2. Dropfoot LLE   Plan of Care:  1. Patient evaluated. X-Rays reviewed.  2. Appointment with Benjie Karvonen for custom molded bracing.  3. Return to clinic as needed.       Edrick Kins, DPM [facility]  Dr. Edrick Kins, DPM    [address], [place] [address]                Office  [phone]  Fax [phone]

## 2018-05-09 ENCOUNTER — Other Ambulatory Visit: Payer: Medicare Other

## 2018-05-11 DIAGNOSIS — H25813 Combined forms of age-related cataract, bilateral: Secondary | ICD-10-CM | POA: Diagnosis not present

## 2018-05-16 DIAGNOSIS — H5213 Myopia, bilateral: Secondary | ICD-10-CM | POA: Diagnosis not present

## 2018-05-20 ENCOUNTER — Other Ambulatory Visit: Payer: Self-pay | Admitting: Neurology

## 2018-05-22 ENCOUNTER — Encounter: Payer: Medicare Other | Attending: Family Medicine | Admitting: Registered"

## 2018-05-22 ENCOUNTER — Other Ambulatory Visit: Payer: Self-pay

## 2018-05-22 DIAGNOSIS — Z713 Dietary counseling and surveillance: Secondary | ICD-10-CM | POA: Insufficient documentation

## 2018-05-22 DIAGNOSIS — E782 Mixed hyperlipidemia: Secondary | ICD-10-CM | POA: Diagnosis not present

## 2018-05-22 DIAGNOSIS — I119 Hypertensive heart disease without heart failure: Secondary | ICD-10-CM | POA: Insufficient documentation

## 2018-05-22 DIAGNOSIS — E669 Obesity, unspecified: Secondary | ICD-10-CM | POA: Diagnosis not present

## 2018-05-22 DIAGNOSIS — Z6832 Body mass index (BMI) 32.0-32.9, adult: Secondary | ICD-10-CM | POA: Diagnosis not present

## 2018-05-22 MED ORDER — GABAPENTIN 800 MG PO TABS: 800.0000 mg | ORAL_TABLET | Freq: Two times a day (BID) | ORAL | 5 refills | Status: DC

## 2018-05-22 NOTE — Patient Instructions (Signed)
   Call your insurance to find out if they cover psychiatrists and who you might be able to see.  Find another way to get transportation for your errands so you won't have to rely on your ex-husband to drive. Your counselor may help you find resources  Talk to your counseling about tools when you are feeling stress so you are not needing chips, cookies and m&ms   When God daughter is staying with you go for a walk with her.  Keep doing your exercises with the dumbells  Use whole wheat bread instead of honey wheat.  Include vegetables with your lunch.  Good job on having vegetables with dinner.

## 2018-05-22 NOTE — Progress Notes (Signed)
Medical Nutrition Therapy:  Appt start time: 1530 end time:  1600  Assessment:  Patient is here for follow-up for weight management.  Patient states she replaced the rubber tip on her cane. Pt denies falls, but states when standing for long periods she feels like she struggles with her balance.  Patient states she cannot walk very far without pain and when her God daughter stays with her 1-2x per week, she tries to get her to go for longer walks. Pt states uses her rollator and will sit for breaks.   Patient continues to verbalize frustration with her ex-husband continually calling her and creating stress for her. Patient attributes her stress eating to interactions with him. Patient states she she still relies on him for errands because no one else in her family drives or lives close enough to help her with this.   Pt states she would like her Goddaughter to spend more time with her because she is a big help with things that are difficult due to stroke related limited use of her hand. Pt stated she was thinking about taking care of a foster child and RD discourages this stating it would probably add more stress to her life instead of helping her. RD encouraged Pt to investigate activities through the senior center.  RD may consider asking Pt for permission to speak with her counselor so we may be able to coordinate care to support each other's services.  Pt states she will be getting a brace for her left foot on July 10 to prevent potential falls.  Preferred Learning Style:   No preference indicated   Learning Readiness:   Change in progress  MEDICATIONS: reviewed   DIETARY INTAKE:   Avoided foods include pork and tuna fish.  24-hr recall:  B ( AM): oatmeal, applesauce, whole wheat toast Snk ( AM): Belvita L ( PM): bologne sandwich, on honey wheat or whole wheat Snk ( PM): dried cranberries (when stressed chips, m&ms, cookies) D ( PM): baked chicken, mac & cheese, vegetable Snk (  PM): fruit Beverages: water, drop-ins   Usual physical activity: continues to do daily arm chair exercises, stretching, twisting, resistance bands and dumbells. 1x week walks to the end of the driveway  Estimated energy needs: 1600 calories 180 g carbohydrates 120 g protein 44 g fat  Progress Towards Goal(s):  In progress. Pt weight still stable, appears to continue improved eating habits.   Nutritional Diagnosis:  [place]-3.3 Overweight/obesity As related to hx of energy dense food choices and sugary beverages.  As evidenced by BMI of 35.9    Intervention:  Nutrition counseling. Discussed getting more vegetables in diet. Discussed need to address stress issues that are driving her snacking on chips and candy.  Plan:  Call your insurance to find out if they cover psychiatrists and who you might be able to see.  Find another way to get transportation for your errands so you won't have to rely on your ex-husband to drive. Your counselor may help you find resources  Talk to your counseler about tools when you are feeling stress so you are not needing chips, cookies and m&ms   When God daughter is staying with you go for a walk with her.  Keep doing your exercises with the dumbells  Use whole wheat bread instead of honey wheat.  Include vegetables with your lunch.  Good job on having vegetables with dinner  Teaching Method Utilized:  Visual Auditory  Handouts given during visit include:  none  Barriers to learning/adherence to lifestyle change: limited finances and reduced mobility due to effects of stroke  Demonstrated degree of understanding via:  Teach Back   Monitoring/Evaluation:  Dietary intake, exercise, and body weight in 2 month(s).

## 2018-06-06 DIAGNOSIS — R739 Hyperglycemia, unspecified: Secondary | ICD-10-CM | POA: Diagnosis not present

## 2018-06-06 DIAGNOSIS — I119 Hypertensive heart disease without heart failure: Secondary | ICD-10-CM | POA: Diagnosis not present

## 2018-06-07 ENCOUNTER — Ambulatory Visit (INDEPENDENT_AMBULATORY_CARE_PROVIDER_SITE_OTHER): Payer: Medicare Other | Admitting: Orthotics

## 2018-06-07 DIAGNOSIS — M1712 Unilateral primary osteoarthritis, left knee: Secondary | ICD-10-CM

## 2018-06-07 DIAGNOSIS — Z8673 Personal history of transient ischemic attack (TIA), and cerebral infarction without residual deficits: Secondary | ICD-10-CM

## 2018-06-07 DIAGNOSIS — M21372 Foot drop, left foot: Secondary | ICD-10-CM

## 2018-06-07 NOTE — Progress Notes (Signed)
Patient  came in to pick up [place] brace w/ dorsi assist [place].  The brace fit well and immediately patient's  gait approved.  The brace provided desired m-l stability in frontal/transverse planes and aided in dorsiflexion in saggital plane. Patient was able to don and doff brace with minimal difficulty.  Overall patient pleased with fit and functionality of brace.  

## 2018-06-08 IMAGING — MG DIGITAL SCREENING BILATERAL MAMMOGRAM WITH CAD
4 series · 4 of 4 positions shown · non-contrast
Comparison: Previous exam(s).

CLINICAL DATA: Screening.

EXAM:
DIGITAL SCREENING BILATERAL MAMMOGRAM WITH CAD

[L MLO]
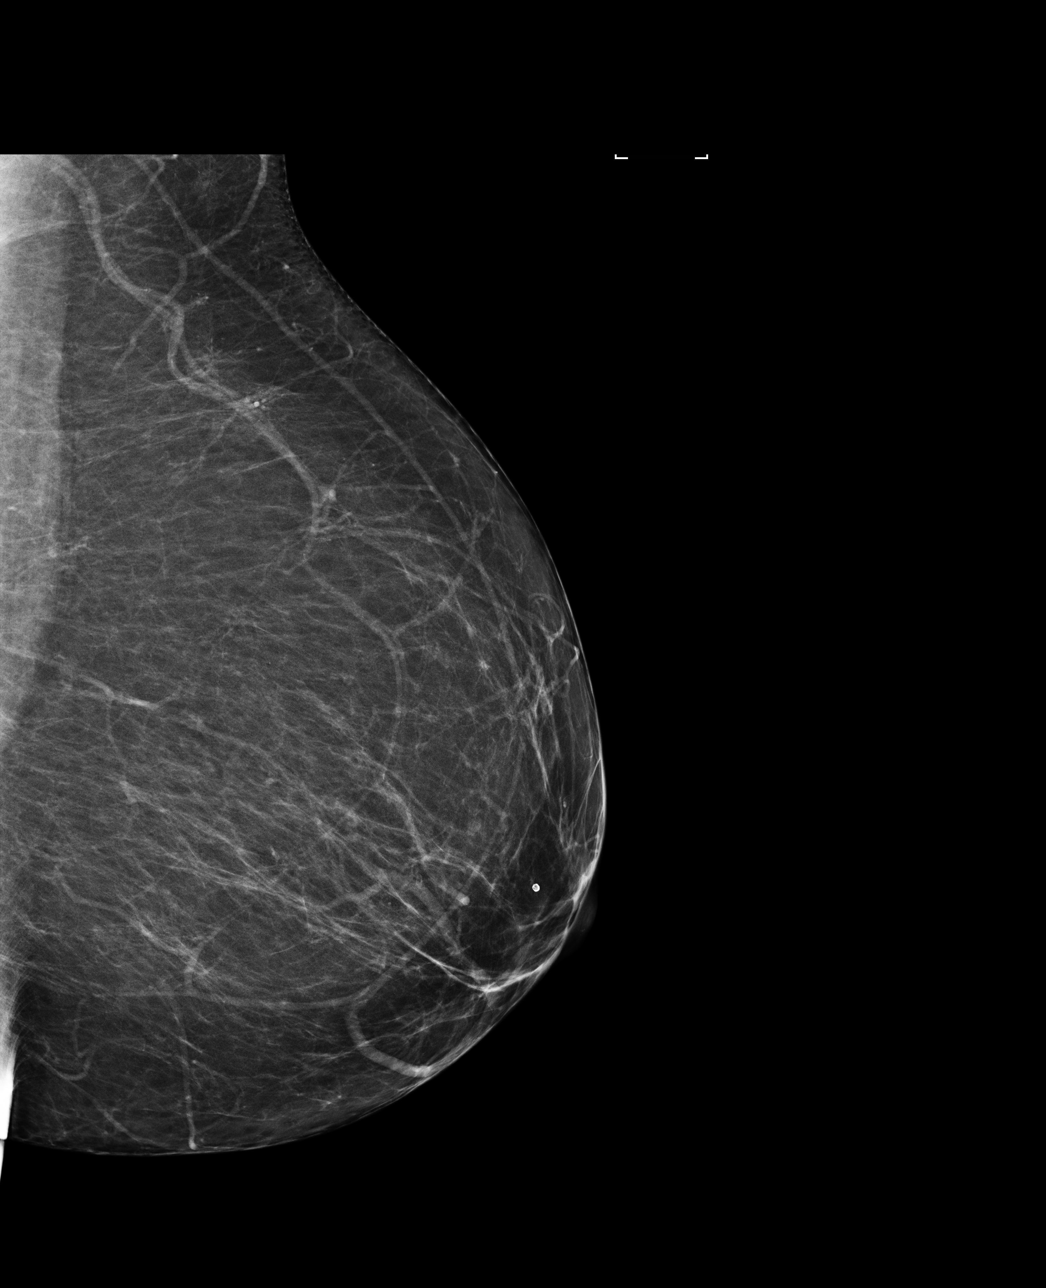

[R CC]
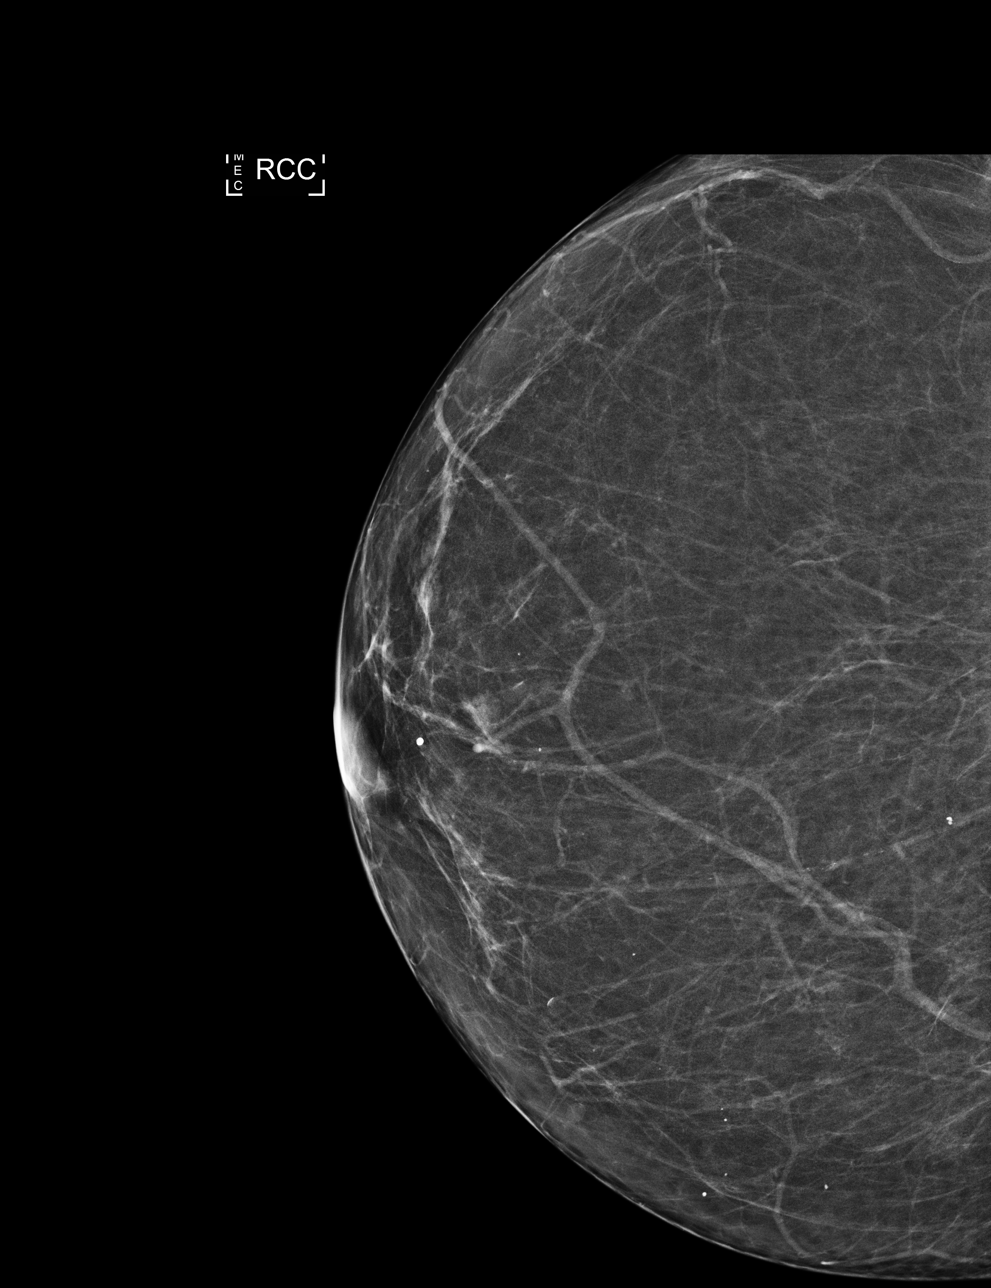

[L CC]
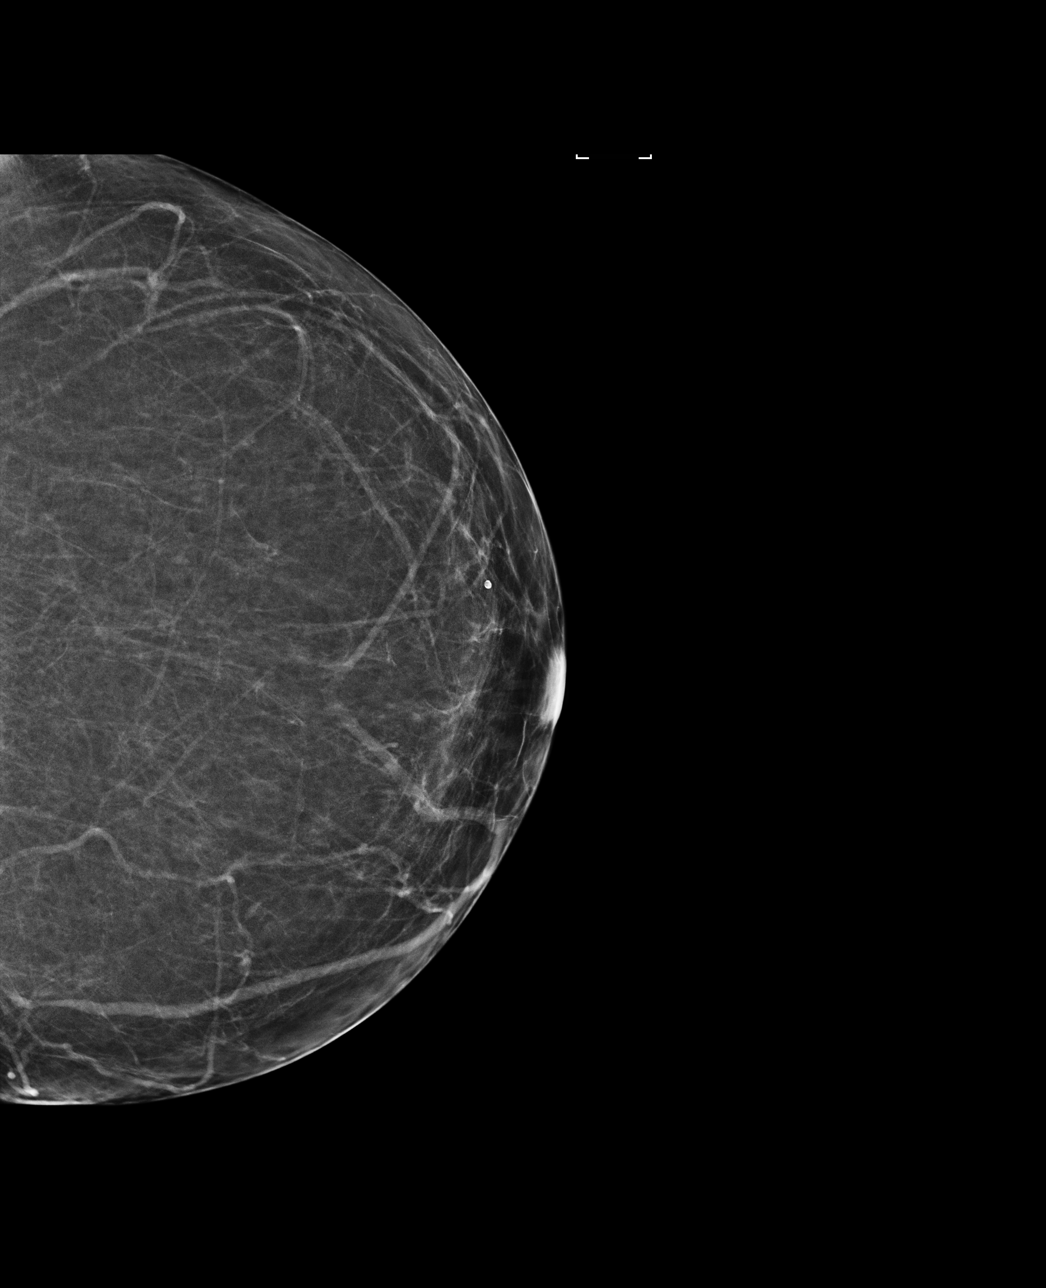

[R MLO]
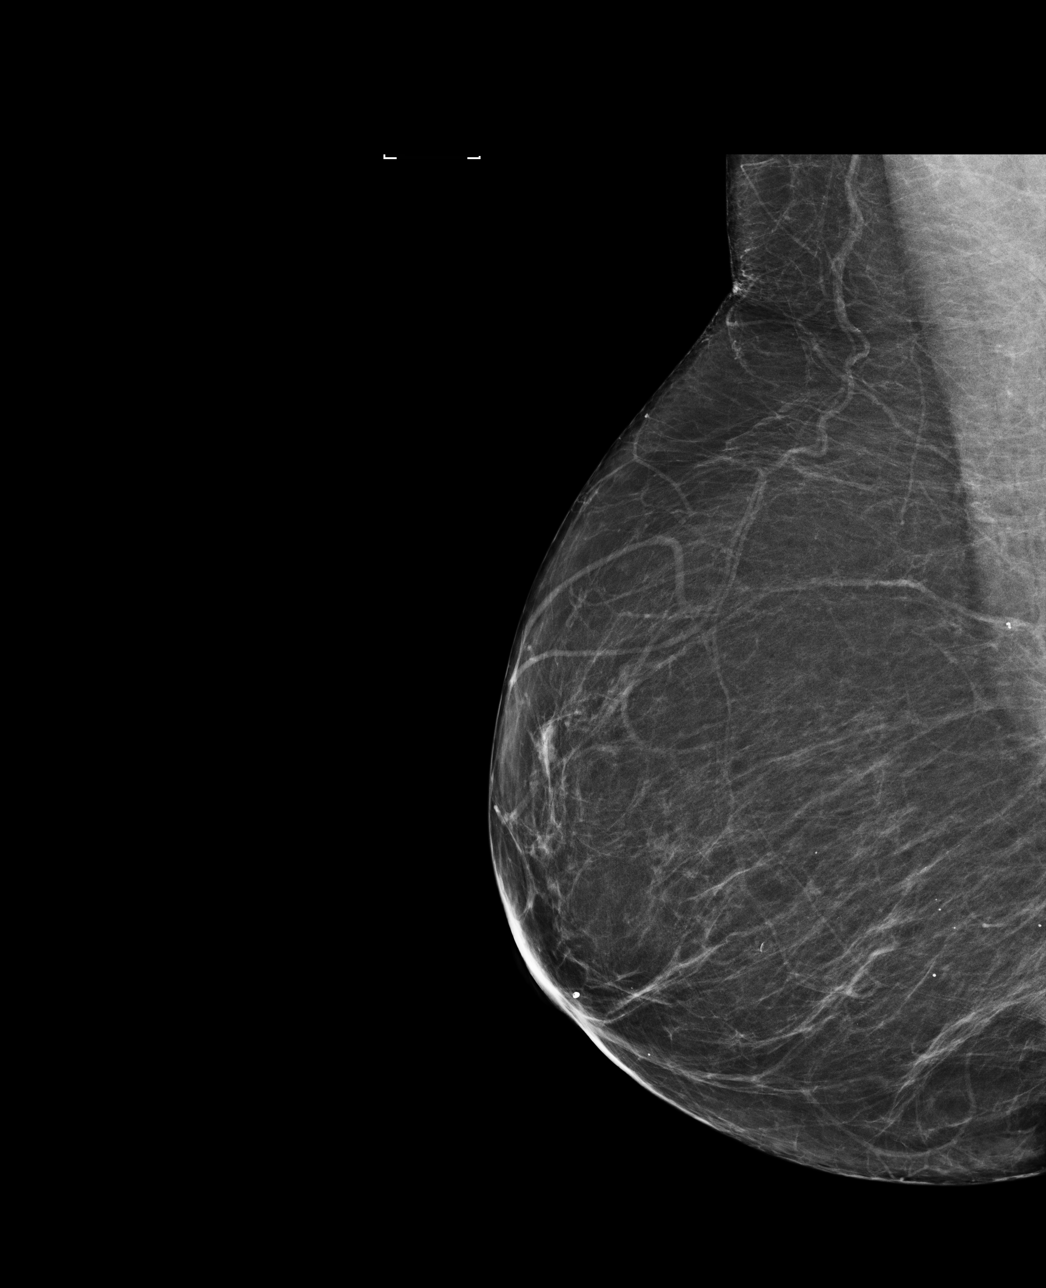

[4 of 4 positions shown; findings below may reference images not displayed]

ACR Breast Density Category b: There are scattered areas of
fibroglandular density.
FINDINGS: There are no findings suspicious for malignancy. Images were
processed with CAD.
IMPRESSION: No mammographic evidence of malignancy. A result letter of this
screening mammogram will be mailed directly to the patient.

RECOMMENDATION:
Screening mammogram in one year. (Code:AS-G-LCT)

BI-RADS CATEGORY  1: Negative.

## 2018-06-20 DIAGNOSIS — H524 Presbyopia: Secondary | ICD-10-CM | POA: Diagnosis not present

## 2018-07-03 ENCOUNTER — Encounter: Payer: Medicare Other | Attending: Family Medicine | Admitting: Registered"

## 2018-07-03 DIAGNOSIS — E669 Obesity, unspecified: Secondary | ICD-10-CM | POA: Insufficient documentation

## 2018-07-03 DIAGNOSIS — I119 Hypertensive heart disease without heart failure: Secondary | ICD-10-CM | POA: Diagnosis not present

## 2018-07-03 DIAGNOSIS — E782 Mixed hyperlipidemia: Secondary | ICD-10-CM | POA: Insufficient documentation

## 2018-07-03 DIAGNOSIS — Z6832 Body mass index (BMI) 32.0-32.9, adult: Secondary | ICD-10-CM | POA: Diagnosis not present

## 2018-07-03 DIAGNOSIS — Z713 Dietary counseling and surveillance: Secondary | ICD-10-CM | POA: Diagnosis not present

## 2018-07-03 NOTE — Progress Notes (Signed)
Medical Nutrition Therapy:  Appt start time: 1200 end time:  1230  Assessment:  Patient is here for follow-up for weight management.  Pt reports that she has switched to whole wheat bread and that she likes it and plans to continue with this change. Pt states she doesn't know how to cook brown rice, states it is crunchy. Pt states she plans to cook baked chicken, white rice, garlic potatoes, seasoned cabbage for dinner tonight.    Pt states the reason she snacks on potato chips is because she forgets that she has other snack foods in the house such as fruit. Pt states she keeps potato chips on top of the fridge and the applesauce,  fruit cups and nutrigrain bars in the cupboard where she doesn't see them.  Pt states she although her citalopram dose has been increased and she continues to see a counselor and a psychiatrist, her depression is not improving and states it is due to her ex-husband constantly calling and coming over to her house uninvited and giving her a hard time. Pt states he doesn't do a lot of talking when they are in the car and states many barriers to finding alternative transportation to get her errands done.   Pt states her son is painting some rooms in his house and should be ready end September for her to move in. Pt states her daughter just got out of [place] and will be moving in with him as well. Pt states she would like to also get a companion animal to keep her company, but states her god daughter is allergic to animals and patient really enjoys her God daughter's company.  Pt states she can't stand or walk very long because her lower back hurts. Pt states she has tried Restaurant manager, fast food and Cortizone shots but neither helped. Pt continues to do arm chair exercises and stretches in her bed because it is easier for her to get up when she is done rather than on the floor which is difficult to get up from.  Preferred Learning Style:   No preference indicated   Learning  Readiness:   Change in progress  MEDICATIONS: reviewed   DIETARY INTAKE:   Avoided foods include pork and tuna fish.  24-hr recall:  B ( AM): grits, eggs, 2 slices bacon, toast Snk ( AM): potato chips (too salty) L ( PM): 2 hot dogs, chips Snk ( PM): m&ms D ( PM): fish, shrimp, coleslaw, hush puppies Snk ( PM): sugar-free jello Beverages: water, drop-ins, [place] tea   Usual physical activity: continues to do daily arm chair exercises, stretching, twisting, resistance bands and dumbells. 1x week walks to the end of the driveway  Estimated energy needs: 1600 calories 180 g carbohydrates 120 g protein 44 g fat  Progress Towards Goal(s):  In progress. Pt weight still stable, appears to continue improved eating habits.   Nutritional Diagnosis:  [place]-3.3 Overweight/obesity As related to hx of energy dense food choices and sugary beverages.  As evidenced by BMI of 35.9    Intervention:  Nutrition counseling. Discussed placing healthier snacks where she will remember to choose them over potato chips. Discussed whole grains and options to get more in diet.  Plan: Moving in with your son sounds like it will be helpful to reduce ex-husband stress. When God daughter is staying with you, go for a walk with her. Keep doing your exercises with the dumbells Continue having whole wheat bread instead of honey wheat. Consider trying instant brown rice (  [employer]cle Ben's is one brand that makes it) Consider having your fruit cups instead of potato chips for snacks Continue drinking plenty of water  Teaching Method Utilized:  Visual Auditory  Handouts given during visit include:  none  Barriers to learning/adherence to lifestyle change: limited finances and reduced mobility due to effects of stroke  Demonstrated degree of understanding via:  Teach Back   Monitoring/Evaluation:  Dietary intake, exercise, and body weight in 2 month(s).

## 2018-07-03 NOTE — Patient Instructions (Signed)
Moving in with your son sounds like it will be helpful to reduce ex-husband stress. When God daughter is staying with you go for a walk with her. Keep doing your exercises with the dumbells Continue having whole wheat bread instead of honey wheat. Consider trying instant brown rice (Uncle [employer]'s is one brand that makes it) Consider having your fruit cups instead of potato chips for snacks Continue drinking plenty of water

## 2018-07-17 DIAGNOSIS — M47816 Spondylosis without myelopathy or radiculopathy, lumbar region: Secondary | ICD-10-CM | POA: Diagnosis not present

## 2018-07-19 ENCOUNTER — Ambulatory Visit (INDEPENDENT_AMBULATORY_CARE_PROVIDER_SITE_OTHER): Payer: Medicare Other | Admitting: Podiatry

## 2018-07-19 DIAGNOSIS — M65172 Other infective (teno)synovitis, left ankle and foot: Secondary | ICD-10-CM | POA: Diagnosis not present

## 2018-07-19 DIAGNOSIS — M21372 Foot drop, left foot: Secondary | ICD-10-CM | POA: Diagnosis not present

## 2018-07-19 DIAGNOSIS — M659 Synovitis and tenosynovitis, unspecified: Secondary | ICD-10-CM

## 2018-07-23 NOTE — Progress Notes (Signed)
   HPI: 67 year old female presenting today for follow up evaluation of left drop foot. She has been wearing the AFO brace which she states helps significantly. She reports some pain in the left ankle. There are no modifying factors noted. Patient is here for further evaluation and treatment.   Past Medical History:  Diagnosis Date  . Anxiety   . Cerebrovascular accident [facility])    a. 2007-->residual left sided weakness  . Chest pain   . Depression   . GERD (gastroesophageal reflux disease)   . Headache   . Hx of hysterectomy   . Hyperlipidemia   . Hypertension   . Mild to Moderate Aortic Insufficiency    a. 08/2014 Echo: EF 55-60%, mild conc LVH, no rwma, Gr 1 DD, mild-mod AI, mild MR.  . Morbid obesity ([place])   . Obesity   . Peptic ulcer disease      Physical Exam: General: The patient is alert and oriented x3 in no acute distress.  Dermatology: Skin is warm, dry and supple bilateral lower extremities. Negative for open lesions or macerations.  Vascular: Palpable pedal pulses bilaterally. No edema or erythema noted. Capillary refill within normal limits.  Neurological: Epicritic and protective threshold grossly intact bilaterally.   Musculoskeletal Exam: Muscle strength 4/5 in all muscle groups of the left lower extremity. Decreased forced pronation of the left foot. Pain with palpation to the anterior, medial and lateral aspects of the left ankle.    Assessment: 1. H/o left-sided stroke 2007 2. Dropfoot LLE 3. Left ankle synovitis    Plan of Care:  1. Patient evaluated.  2. Continue wearing AFO brace and good shoe gear.  3. Injection of 0.5 mLs Celestone Soluspan injected into the left ankle joint.  4. Return to clinic as needed.      Edrick Kins, DPM [facility]  Dr. Edrick Kins, DPM    [address], [place] [address]                Office [phone]  Fax [phone]

## 2018-08-07 DIAGNOSIS — E785 Hyperlipidemia, unspecified: Secondary | ICD-10-CM | POA: Diagnosis not present

## 2018-08-07 DIAGNOSIS — I119 Hypertensive heart disease without heart failure: Secondary | ICD-10-CM | POA: Diagnosis not present

## 2018-08-07 DIAGNOSIS — I1310 Hypertensive heart and chronic kidney disease without heart failure, with stage 1 through stage 4 chronic kidney disease, or unspecified chronic kidney disease: Secondary | ICD-10-CM | POA: Diagnosis not present

## 2018-08-14 DIAGNOSIS — M47816 Spondylosis without myelopathy or radiculopathy, lumbar region: Secondary | ICD-10-CM | POA: Diagnosis not present

## 2018-08-24 DIAGNOSIS — G8929 Other chronic pain: Secondary | ICD-10-CM | POA: Diagnosis not present

## 2018-08-24 DIAGNOSIS — M545 Low back pain: Secondary | ICD-10-CM | POA: Diagnosis not present

## 2018-08-24 DIAGNOSIS — I10 Essential (primary) hypertension: Secondary | ICD-10-CM | POA: Diagnosis not present

## 2018-09-04 ENCOUNTER — Encounter: Payer: Medicare Other | Attending: Family Medicine | Admitting: Registered"

## 2018-09-04 DIAGNOSIS — I119 Hypertensive heart disease without heart failure: Secondary | ICD-10-CM | POA: Insufficient documentation

## 2018-09-04 DIAGNOSIS — Z6832 Body mass index (BMI) 32.0-32.9, adult: Secondary | ICD-10-CM | POA: Diagnosis not present

## 2018-09-04 DIAGNOSIS — E782 Mixed hyperlipidemia: Secondary | ICD-10-CM | POA: Diagnosis not present

## 2018-09-04 DIAGNOSIS — Z713 Dietary counseling and surveillance: Secondary | ICD-10-CM | POA: Diagnosis not present

## 2018-09-04 DIAGNOSIS — E669 Obesity, unspecified: Secondary | ICD-10-CM | POA: Insufficient documentation

## 2018-09-04 NOTE — Patient Instructions (Addendum)
Try Uncle Bens brown rice either boil in the bag or instant. Use the handout for ideas to get some other meals and snack ideas. Remember to have your fruit cups or sugar-free jello for snacks. Pinto beans a great to include in diet. When eating ramen noodles use only 1/2 pack of seasoning mix to reduce the sodium. Use the handout Planning Health Meals to help create balanced meals.

## 2018-09-04 NOTE — Progress Notes (Signed)
Medical Nutrition Therapy:  Appt start time: 1200 end time:  1235  Assessment:  Patient is here for follow-up for weight management.  Pt states her stress is now coming from her brother who tells her she doesn't love him. Pt reports her ex-husband no longer calls her because she has a new relationship. Pt reports she is still seeing a psychiatrist as well as taking medication for headaches.  Pt reports that she has continues to eat whole wheat bread. Pt states she doesn't know how to cook brown rice, states it is crunchy. Pt states she plans to fried chicken, pintos beans, green beans.  Pt states when she cooks ramen noodles it is too salty so she adds butter to cut the salt taste. Patient reports snacking on m&ms and fritos more during this visit than in past. RD not sure if it has really increased or if she has not been reporting in the past.  Pt states her son isn't working on his house any more, doesn't know if he is still planning on having her stay with him. She still wants to get out of apartment.   Preferred Learning Style:   No preference indicated   Learning Readiness:   Change in progress  MEDICATIONS: reviewed   DIETARY INTAKE:   Avoided foods include pork and tuna fish.  24-hr recall:  B ( AM): instant grits, [place], 1 slice ww toast Snk ( AM): m&ms with peanuts L ( PM):hamburger, fritos Snk ( PM): none D ( PM): chicken tenders, carrots, broccoli celery dip Snk ( PM): fritos Beverages: water, drop-ins, [place] tea   Usual physical activity: continues to do daily arm chair exercises, stretching, twisting, resistance bands and dumbells. 1x week walks to the end of the driveway  Estimated energy needs: 1600 calories 180 g carbohydrates 120 g protein 44 g fat  Progress Towards Goal(s):  Minimal progress.    Nutritional Diagnosis:  [place]-3.3 Overweight/obesity As related to hx of energy dense food choices and sugary beverages.  As evidenced by BMI of 35.9     Intervention:  Nutrition counseling. Reviewed balanced eating and taught how to use the handout to create meals.  Plan: Try Uncle Bens brown rice either boil in the bag or instant. Use the handout for ideas to get some other meals and snack ideas. Remember to have your fruit cups or sugar-free jello for snacks. Pinto beans a great to include in diet. When eating ramen noodles use only 1/2 pack of seasoning mix to reduce the sodium. Use the handout Planning Health Meals to help create balanced meals.  Teaching Method Utilized:  Visual Auditory  Handouts given during visit include:  Planning Healthy Meals (novo nordisk)  Barriers to learning/adherence to lifestyle change: limited finances and reduced mobility due to effects of stroke  Demonstrated degree of understanding via:  Teach Back   Monitoring/Evaluation:  Dietary intake, exercise, and body weight in 2 month(s).

## 2018-09-06 DIAGNOSIS — I10 Essential (primary) hypertension: Secondary | ICD-10-CM | POA: Diagnosis not present

## 2018-09-07 DIAGNOSIS — M545 Low back pain: Secondary | ICD-10-CM | POA: Diagnosis not present

## 2018-09-11 ENCOUNTER — Other Ambulatory Visit: Payer: Self-pay

## 2018-09-11 NOTE — Patient Outreach (Signed)
[facility]) Care Management  09/11/2018  [name] 1951-03-17 [id number]   Medication Adherence call to Mrs. Chelsea [name] left a message for patient to call back patient is due on Amlodepine/ Valsartan 10/160 under [place].  [facility] Direct Dial [phone]  Fax [phone] [name]

## 2018-09-18 DIAGNOSIS — M47816 Spondylosis without myelopathy or radiculopathy, lumbar region: Secondary | ICD-10-CM | POA: Diagnosis not present

## 2018-09-18 DIAGNOSIS — M4316 Spondylolisthesis, lumbar region: Secondary | ICD-10-CM | POA: Diagnosis not present

## 2018-09-18 DIAGNOSIS — M545 Low back pain: Secondary | ICD-10-CM | POA: Diagnosis not present

## 2018-10-12 ENCOUNTER — Encounter: Payer: Self-pay | Admitting: Adult Health

## 2018-10-12 ENCOUNTER — Ambulatory Visit (INDEPENDENT_AMBULATORY_CARE_PROVIDER_SITE_OTHER): Payer: Medicare Other | Admitting: Adult Health

## 2018-10-12 VITALS — BP 144/95 | HR 88 | Ht 66.0 in | Wt 208.6 lb

## 2018-10-12 DIAGNOSIS — G890 Central pain syndrome: Secondary | ICD-10-CM | POA: Diagnosis not present

## 2018-10-12 MED ORDER — GABAPENTIN 800 MG PO TABS: 800.0000 mg | ORAL_TABLET | Freq: Two times a day (BID) | ORAL | 3 refills | Status: DC

## 2018-10-12 NOTE — Patient Instructions (Signed)
Your Plan: ? ?Continue Gabapentin  ?If your symptoms worsen or you develop new symptoms please let us know.  ? ? ?Thank you for coming to see us at [facility]. I hope we have been able to provide you high quality care today. ? ?You may receive a patient satisfaction survey over the next few weeks. We would appreciate your feedback and comments so that we may continue to improve ourselves and the health of our patients. ? ?

## 2018-10-12 NOTE — Progress Notes (Signed)
I have read the note, and I agree with the clinical assessment and plan.  Charles K Willis   

## 2018-10-12 NOTE — Progress Notes (Addendum)
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  She returns today for follow-up.  She remains on gabapentin taking 800 mg twice a day.  The pain continues to like the left side.  She states that gabapentin is beneficial.  She wears an AFO brace on the left foot.  She uses a cane when ambulating.  She reports that she has trouble ambulating long distances.  She plans to see Dr. Maryjean Ka for epidural steroid injection.  She states that Sunday she went to get something out of the refrigerator and fell backwards.  Fortunately she did not suffer any significant injuries.  She reports that she is just a little sore.  She returns today for evaluation.  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  She returns today for follow-up.  She is currently taking gabapentin 800 mg twice a day.  She reports that this works well for her pain on the left side of the body.  She states that she does have a tremor with the left hand as significant.  She states that this is been present since her stroke.  She also reports that she has daily mild headaches.  She states that they occur across the forehead.  She has tried taking over-the-counter medication such as Tylenol but it offers her no benefit.  She denies photophobia, phonophobia, nausea or vomiting.  She reports that she discussed this with her primary care provider.  She states that they felt that this was related to stress.  The patient reports that she is under an excessive amount of stress dealing with family issues.  She reports that she is on Celexa for anxiety and depression.  She denies any excessive daytime sleepiness.  She reports that she is never been told that she snores but she is  unsure.  She is also unsure about any witnessed apneic events.  She also reports that she is had dizzy events.  She is unsure if it occurs with position changes.  She states that they are usually very brief.  She is unable to give a lot of detail about the events.  She returns today for evaluation.   REVIEW OF SYSTEMS: Out of a complete 14 system review of symptoms, the patient complains only of the following symptoms, and all other reviewed systems are negative.  Joint pain, back pain, headaches  ALLERGIES: Allergies  Allergen Reactions  . Other Swelling    Hair dye, caused eye swelling, multiple times experienced this reaction    HOME MEDICATIONS: Outpatient Medications Prior to Visit  Medication Sig Dispense Refill  . amLODipine-valsartan (EXFORGE) 10-160 MG per tablet Take 1 tablet by mouth daily. 90 tablet 3  . aspirin EC 81 MG tablet Take 1 tablet (81 mg total) by mouth daily.    Marland Kitchen atorvastatin (LIPITOR) 10 MG tablet TAKE 1 TABLET(10 MG) BY MOUTH DAILY 90 tablet 3  . Calcium-Magnesium-Vitamin D (CALCIUM 500 PO) Take 1 tablet by mouth daily.     Marland Kitchen CARAFATE 1 GM/10ML suspension Take 10 mLs by mouth 4 (four) times daily.    . citalopram (CELEXA) 10 MG tablet Take 20 mg by mouth daily.     . cloNIDine (CATAPRES) 0.3 MG tablet TAKE 1 TABLET  BY MOUTH TWICE DAILY 180 tablet 3  . cyanocobalamin 100 MCG tablet Take 100 mcg by mouth daily.     Marland Kitchen doxazosin (CARDURA) 8 MG tablet Take 8 mg by mouth at bedtime.    Marland Kitchen ezetimibe (ZETIA) 10 MG tablet Take 10 mg by mouth daily.    . furosemide (LASIX) 20 MG tablet Take 20 mg by mouth 2 (two) times daily.     Marland Kitchen gabapentin (NEURONTIN) 800 MG tablet Take 1 tablet (800 mg total) by mouth 2 (two) times daily. 60 tablet 5  . methocarbamol (ROBAXIN) 500 MG tablet 1 tablet q 6hr prn muscle spasm 60 tablet 1  . mirabegron ER (MYRBETRIQ) 25 MG TB24 tablet Take 25 mg by mouth daily.    . Multiple Vitamin (MULTIVITAMIN) tablet Take 1 tablet by mouth daily.       . pantoprazole (PROTONIX) 40 MG tablet Take 40 mg by mouth 2 (two) times daily.     . TRAMADOL HCL ER PO Take 1 tablet by mouth every 6 (six) hours as needed (PAIN).      No facility-administered medications prior to visit.     PAST MEDICAL HISTORY: Past Medical History:  Diagnosis Date  . Anxiety   . Cerebrovascular accident [facility])    a. 2007-->residual left sided weakness  . Chest pain   . Depression   . GERD (gastroesophageal reflux disease)   . Headache   . Hx of hysterectomy   . Hyperlipidemia   . Hypertension   . Mild to Moderate Aortic Insufficiency    a. 08/2014 Echo: EF 55-60%, mild conc LVH, no rwma, Gr 1 DD, mild-mod AI, mild MR.  . Morbid obesity ([place])   . Obesity   . Peptic ulcer disease     PAST SURGICAL HISTORY: Past Surgical History:  Procedure Laterality Date  . ABDOMINAL HYSTERECTOMY    . 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 TUBAL LIGATION      FAMILY HISTORY: Family History  Problem Relation Age of Onset  . Hypertension Mother        deceased  . Peripheral vascular disease Mother   . Cirrhosis Father        DeCEASED  . Heart attack Other        grandmother deceased  . Breast cancer Neg Hx     SOCIAL HISTORY: Social History   Socioeconomic History  . Marital status: Married    Spouse name: Not on file  . Number of children: 3  . Years of education: 5  . Highest education level: Not on file  Occupational History  . 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 Financial resource strain: Not on file  . Food insecurity:    Worry: Not on file    Inability: Not on file  . Transportation needs:    Medical: Not on file    Non-medical: Not on file  Tobacco Use  . Smoking status: Current Every Day Smoker  . Smokeless tobacco: Never Used  Substance  and Sexual Activity  . Alcohol use: No    Alcohol/week: 0.0 standard drinks  . Drug use: No  . Sexual activity: Not Currently  Lifestyle  . Physical activity:    Days per week: Not on file    Minutes per session: Not on file  .  Stress: Not on file  Relationships  . Social connections:    Talks on phone: Not on file    Gets together: Not on file    Attends religious service: Not on file    Active member of club or organization: Not on file    Attends meetings of clubs or organizations: Not on file    Relationship status: Not on file  . Intimate partner violence:    Fear of current or ex partner: Not on file    Emotionally abused: Not on file    Physically abused: Not on file    Forced sexual activity: Not on file  Other Topics Concern  . Not on file  Social History Narrative   Patient consumes no caffeine      PHYSICAL EXAM  Vitals:   10/12/18 1251  BP: (!) 144/95  Pulse: 88  Weight: 208 lb 9.6 oz (94.6 kg)  Height: 5\' 6"  (1.676 m)   Body mass index is 33.67 kg/m.  Generalized: Well developed, in no acute distress   Neurological examination  Mentation: Alert oriented to time, place, history taking. Follows all commands speech and language fluent Cranial nerve II-XII: Pupils were equal round reactive to light. Extraocular movements were full, visual field were full on confrontational test. Facial sensation and strength were normal. Uvula tongue midline. Head turning and shoulder shrug  were normal and symmetric. Motor: The motor testing reveals 5 over 5 strength of all 4 extremities. Good symmetric motor tone is noted throughout.  Tremor noted in the left upper extremity. Sensory: Sensory testing is intact to soft touch on all 4 extremities. No evidence of extinction is noted.  Coordination: Cerebellar testing reveals good finger-nose-finger and heel-to-shin bilaterally.  Gait and station: Gait is normal. Tandem gait is normal. Romberg is negative. No drift is seen.    Reflexes: Deep tendon reflexes are symmetric and normal bilaterally.   DIAGNOSTIC DATA (LABS, IMAGING, TESTING) - I reviewed patient records, labs, notes, testing and imaging myself where available.  Lab Results  Component Value Date   WBC 4.0 08/09/2016   HGB 14.6 08/09/2016   HCT 43.0 08/09/2016   MCV 89.8 08/09/2016   PLT 237 08/09/2016      Component Value Date/Time   NA 145 08/09/2016 1603   K 3.5 08/09/2016 1603   CL 103 08/09/2016 1603   CO2 28 04/20/2016 1409   GLUCOSE 89 08/09/2016 1603   BUN 14 08/09/2016 1603   CREATININE 1.20 (H) 08/09/2016 1603   CREATININE 1.11 (H) 04/20/2016 1409   CALCIUM 9.4 04/20/2016 1409   PROT 7.2 06/13/2013 1045   ALBUMIN 3.6 06/13/2013 1045   AST 19 06/13/2013 1045   ALT 17 06/13/2013 1045   ALKPHOS 93 06/13/2013 1045   BILITOT 0.3 06/13/2013 1045   GFRNONAA 42 (L) 01/06/2016 0312   GFRAA 49 (L) 01/06/2016 0312   Lab Results  Component Value Date   CHOL 134 04/20/2016   HDL 54 04/20/2016   LDLCALC 60 04/20/2016   TRIG 99 04/20/2016   CHOLHDL 2.5 04/20/2016        ASSESSMENT AND PLAN 67 y.o. year old female  has a past medical history of Anxiety, Cerebrovascular accident ([place]), Chest pain, Depression, GERD (gastroesophageal reflux disease), Headache, hysterectomy, Hyperlipidemia, Hypertension, Mild to Moderate Aortic Insufficiency, Morbid obesity ([place]), Obesity, and Peptic ulcer disease. here with:  1.  Thalamic pain syndrome 2.  Abnormality of gait  The patient will continue on gabapentin 800 mg twice a day.  The patient is considering getting a hoover-round to use for longer distances.  I have reviewed that this may be of benefit.  Patient is advised that if her symptoms worsen or she develops new symptoms she should let us know.  She will follow-up in 1 year or sooner if needed.    Ward Givens, MSN, NP-C 10/12/2018, 11:56 AM [facility] [address] [place], [place] [address] [phone]

## 2018-10-25 DIAGNOSIS — M47816 Spondylosis without myelopathy or radiculopathy, lumbar region: Secondary | ICD-10-CM | POA: Diagnosis not present

## 2018-11-06 ENCOUNTER — Other Ambulatory Visit: Payer: Self-pay | Admitting: Family Medicine

## 2018-11-06 ENCOUNTER — Encounter: Payer: Medicare Other | Attending: Family Medicine | Admitting: Registered"

## 2018-11-06 DIAGNOSIS — Z713 Dietary counseling and surveillance: Secondary | ICD-10-CM | POA: Insufficient documentation

## 2018-11-06 DIAGNOSIS — E782 Mixed hyperlipidemia: Secondary | ICD-10-CM | POA: Diagnosis not present

## 2018-11-06 DIAGNOSIS — Z6832 Body mass index (BMI) 32.0-32.9, adult: Secondary | ICD-10-CM | POA: Diagnosis not present

## 2018-11-06 DIAGNOSIS — I119 Hypertensive heart disease without heart failure: Secondary | ICD-10-CM | POA: Diagnosis not present

## 2018-11-06 DIAGNOSIS — E669 Obesity, unspecified: Secondary | ICD-10-CM | POA: Diagnosis not present

## 2018-11-06 DIAGNOSIS — Z1231 Encounter for screening mammogram for malignant neoplasm of breast: Secondary | ICD-10-CM

## 2018-11-06 NOTE — Progress Notes (Signed)
Follow Up for Medical Nutrition Therapy:  Appt start time: 1150 end time:  1220  Assessment:  Patient is here for follow-up for weight management.  Patient states she has been staying at a friend's house for a week and has enjoyed the change of scenery. Pt states she was getting tired of looking at the same walls and having the same routine at home and enjoyed the break.  Pt states her son is very supportive and wants to help her. Pt states er son works at [employer], and next year he wants to get a house they can share so he can do more to look out for her.  Patient states recently she tried some beets and really enjoyed them. Pt states she looks up recipes on her phone and enjoys feeling successful in creating good meals. Pt states when she continues to cook ramen noodles with butter to reduce the salty flavor.   Pt states recently she had an epidural in her back for pain management and felt extremely drowsy after treatment.  Pt states she isn't looking forward to Christmas because her family members are going other places this year  Preferred Learning Style:   No preference indicated   Learning Readiness:   Change in progress  MEDICATIONS: reviewed   DIETARY INTAKE:   Avoided foods include pork and tuna fish.  24-hr recall:  B ( AM): 2 chocolate muffins, coffee Snk ( AM): choc chip cookie  L ( PM):mixed fruit cup Snk ( PM): none D ( PM): bbq chicken wings, fries Snk ( PM): fritos Beverages: water, drop-ins, AZ tea   Usual physical activity: continues to do daily arm chair exercises, stretching, twisting, resistance bands and dumbells. 1x week walks to the end of the driveway  Estimated energy needs: 1600 calories 180 g carbohydrates 120 g protein 44 g fat  Progress Towards Goal(s):  Minimal progress.    Nutritional Diagnosis:  [place]-3.3 Overweight/obesity As related to hx of energy dense food choices and sugary beverages.  As evidenced by BMI of 35.9    Intervention:   Nutrition counseling. Reviewed balanced eating and taught how to use the handout to create meals.  Plan: Continue with your plan to cooking your pinto, get steamable lima beans and brown rice, cabbage, baked chicken Since you liked the Uncle Bens brown rice boil in the bag tried \\using  that instead of white rice Look for whole gain rice-a-roni. Ramen noodles: instead of adding butter, try using just 1/2 the seasoning packet Google the beet recipe you liked. 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

## 2018-11-06 NOTE — Patient Instructions (Addendum)
Continue with your plan to cooking your pinto, get steamable lima beans and brown rice, cabbage, baked chicken Since you liked the Uncle Bens brown rice boil in the bag tried \\using  that instead of white rice Look for whole gain rice-a-roni. Ramen noodles: instead of adding butter, try using just 1/2 the seasoning packet Google the beet recipe you liked. Consider making the stir fry more often

## 2018-11-07 DIAGNOSIS — I6789 Other cerebrovascular disease: Secondary | ICD-10-CM | POA: Diagnosis not present

## 2018-11-07 DIAGNOSIS — R7309 Other abnormal glucose: Secondary | ICD-10-CM | POA: Diagnosis not present

## 2018-11-07 DIAGNOSIS — I10 Essential (primary) hypertension: Secondary | ICD-10-CM | POA: Diagnosis not present

## 2018-11-07 DIAGNOSIS — I119 Hypertensive heart disease without heart failure: Secondary | ICD-10-CM | POA: Diagnosis not present

## 2018-11-09 DIAGNOSIS — M47817 Spondylosis without myelopathy or radiculopathy, lumbosacral region: Secondary | ICD-10-CM | POA: Diagnosis not present

## 2018-11-13 DIAGNOSIS — I10 Essential (primary) hypertension: Secondary | ICD-10-CM | POA: Diagnosis not present

## 2018-11-13 DIAGNOSIS — Z9181 History of falling: Secondary | ICD-10-CM | POA: Diagnosis not present

## 2018-11-13 DIAGNOSIS — I69354 Hemiplegia and hemiparesis following cerebral infarction affecting left non-dominant side: Secondary | ICD-10-CM | POA: Diagnosis not present

## 2018-11-14 DIAGNOSIS — I10 Essential (primary) hypertension: Secondary | ICD-10-CM | POA: Diagnosis not present

## 2018-11-15 DIAGNOSIS — H903 Sensorineural hearing loss, bilateral: Secondary | ICD-10-CM | POA: Diagnosis not present

## 2018-11-15 DIAGNOSIS — E041 Nontoxic single thyroid nodule: Secondary | ICD-10-CM | POA: Diagnosis not present

## 2018-11-17 DIAGNOSIS — Z9181 History of falling: Secondary | ICD-10-CM | POA: Diagnosis not present

## 2018-11-17 DIAGNOSIS — I10 Essential (primary) hypertension: Secondary | ICD-10-CM | POA: Diagnosis not present

## 2018-11-17 DIAGNOSIS — I69354 Hemiplegia and hemiparesis following cerebral infarction affecting left non-dominant side: Secondary | ICD-10-CM | POA: Diagnosis not present

## 2018-11-20 ENCOUNTER — Other Ambulatory Visit: Payer: Self-pay | Admitting: Otolaryngology

## 2018-11-20 DIAGNOSIS — I10 Essential (primary) hypertension: Secondary | ICD-10-CM | POA: Diagnosis not present

## 2018-11-20 DIAGNOSIS — Z9181 History of falling: Secondary | ICD-10-CM | POA: Diagnosis not present

## 2018-11-20 DIAGNOSIS — E041 Nontoxic single thyroid nodule: Secondary | ICD-10-CM

## 2018-11-20 DIAGNOSIS — I69354 Hemiplegia and hemiparesis following cerebral infarction affecting left non-dominant side: Secondary | ICD-10-CM | POA: Diagnosis not present

## 2018-11-23 DIAGNOSIS — I10 Essential (primary) hypertension: Secondary | ICD-10-CM | POA: Diagnosis not present

## 2018-11-23 DIAGNOSIS — I69354 Hemiplegia and hemiparesis following cerebral infarction affecting left non-dominant side: Secondary | ICD-10-CM | POA: Diagnosis not present

## 2018-11-23 DIAGNOSIS — Z9181 History of falling: Secondary | ICD-10-CM | POA: Diagnosis not present

## 2018-11-27 ENCOUNTER — Other Ambulatory Visit: Payer: Medicare Other

## 2018-11-28 ENCOUNTER — Other Ambulatory Visit: Payer: Medicare Other

## 2018-11-28 DIAGNOSIS — Z9181 History of falling: Secondary | ICD-10-CM | POA: Diagnosis not present

## 2018-11-28 DIAGNOSIS — I69354 Hemiplegia and hemiparesis following cerebral infarction affecting left non-dominant side: Secondary | ICD-10-CM | POA: Diagnosis not present

## 2018-11-28 DIAGNOSIS — I10 Essential (primary) hypertension: Secondary | ICD-10-CM | POA: Diagnosis not present

## 2018-11-30 ENCOUNTER — Ambulatory Visit
Admission: RE | Admit: 2018-11-30 | Discharge: 2018-11-30 | Disposition: A | Discharge status: Home / Self Care | Payer: Medicare Other | Source: Ambulatory Visit | Attending: Otolaryngology | Admitting: Otolaryngology

## 2018-11-30 DIAGNOSIS — E041 Nontoxic single thyroid nodule: Secondary | ICD-10-CM

## 2018-11-30 DIAGNOSIS — E042 Nontoxic multinodular goiter: Secondary | ICD-10-CM | POA: Diagnosis not present

## 2018-12-05 DIAGNOSIS — Z9181 History of falling: Secondary | ICD-10-CM | POA: Diagnosis not present

## 2018-12-05 DIAGNOSIS — I10 Essential (primary) hypertension: Secondary | ICD-10-CM | POA: Diagnosis not present

## 2018-12-05 DIAGNOSIS — I69354 Hemiplegia and hemiparesis following cerebral infarction affecting left non-dominant side: Secondary | ICD-10-CM | POA: Diagnosis not present

## 2018-12-12 ENCOUNTER — Ambulatory Visit
Admission: RE | Admit: 2018-12-12 | Discharge: 2018-12-12 | Disposition: A | Discharge status: Home / Self Care | Payer: Medicare Other | Source: Ambulatory Visit | Attending: Family Medicine | Admitting: Family Medicine

## 2018-12-12 ENCOUNTER — Ambulatory Visit: Payer: Medicare Other

## 2018-12-12 DIAGNOSIS — Z1231 Encounter for screening mammogram for malignant neoplasm of breast: Secondary | ICD-10-CM

## 2018-12-13 DIAGNOSIS — I69354 Hemiplegia and hemiparesis following cerebral infarction affecting left non-dominant side: Secondary | ICD-10-CM | POA: Diagnosis not present

## 2018-12-13 DIAGNOSIS — I10 Essential (primary) hypertension: Secondary | ICD-10-CM | POA: Diagnosis not present

## 2018-12-13 DIAGNOSIS — Z9181 History of falling: Secondary | ICD-10-CM | POA: Diagnosis not present

## 2018-12-15 DIAGNOSIS — I119 Hypertensive heart disease without heart failure: Secondary | ICD-10-CM | POA: Diagnosis not present

## 2018-12-15 DIAGNOSIS — I10 Essential (primary) hypertension: Secondary | ICD-10-CM | POA: Diagnosis not present

## 2018-12-15 DIAGNOSIS — I6789 Other cerebrovascular disease: Secondary | ICD-10-CM | POA: Diagnosis not present

## 2018-12-25 DIAGNOSIS — M47817 Spondylosis without myelopathy or radiculopathy, lumbosacral region: Secondary | ICD-10-CM | POA: Diagnosis not present

## 2018-12-25 DIAGNOSIS — I10 Essential (primary) hypertension: Secondary | ICD-10-CM | POA: Diagnosis not present

## 2019-01-08 ENCOUNTER — Encounter: Payer: Medicare Other | Attending: Family Medicine | Admitting: Registered"

## 2019-01-08 DIAGNOSIS — I119 Hypertensive heart disease without heart failure: Secondary | ICD-10-CM | POA: Diagnosis not present

## 2019-01-08 DIAGNOSIS — Z6832 Body mass index (BMI) 32.0-32.9, adult: Secondary | ICD-10-CM | POA: Insufficient documentation

## 2019-01-08 DIAGNOSIS — E782 Mixed hyperlipidemia: Secondary | ICD-10-CM | POA: Diagnosis not present

## 2019-01-08 DIAGNOSIS — Z713 Dietary counseling and surveillance: Secondary | ICD-10-CM

## 2019-01-08 DIAGNOSIS — E669 Obesity, unspecified: Secondary | ICD-10-CM | POA: Insufficient documentation

## 2019-01-08 NOTE — Patient Instructions (Addendum)
Continue having the brown rice and stir fry veggies. Continue with your plan to stock up on snack that you won't be overeat - Plan to eat something before you go shopping to help you resist temptation. Continue your strategies to ask yourself if you really want foods that you know you should be eating less off. Tacos are fine for a lunch choice, better if you can also include some non-starchy veggies Continue playing games while watching tv instead of eating chips. Continue stretching, aim to do everyday and do it in moderation so it doesn't hurt, listen to your body.

## 2019-01-08 NOTE — Progress Notes (Signed)
Follow Up for Medical Nutrition Therapy:  Appt start time: 1200 end time:  1230  Assessment:  Patient is here for follow-up for weight management.  Patient states she bought brown rice. Pt reports she is not snacking recently because there are no snacks in the house, waiting for more food stamps and plans to get unsalted chips, fruit flavored water, fat-free popcorn. Pt states she is still using pinto beans and ground [place], and cornbread muffins. Pt reports she hasn't had m&ms in a month, but states they are better than Fritos, because with M&Ms she can stop at one handful, but snacks like Fritos, cheetos, chips cannot stop.    Pt states she got hearing aids end of December.   Pt seemed unusually somnolent and she states it is because the medication she took before the appointment makes her drowsy..  Preferred Learning Style:   No preference indicated   Learning Readiness:   Change in progress  MEDICATIONS: reviewed   DIETARY INTAKE:   Avoided foods include pork and tuna fish.  24-hr recall:  B ( AM): egg, bacon, peaches Snk ( AM):  none L ( PM): PB&J, 1/2 apple (for birthday had, [employer] burger, fries, soda) Snk ( PM): none D ( PM): baked chicken, rice, stir fry veggies, 1/2 slice wheat bread Snk ( PM): none Beverages: water, drop-ins, AZ tea, diet soda sometimes   Usual physical activity: ADLs. Pt states too cold to walk. Does a little walking in the house and still doing stretching, twisting, resistance bands and dumbells.  Estimated energy needs: 1600 calories 180 g carbohydrates 120 g protein 44 g fat  Progress Towards Goal(s):  in progress.    Nutritional Diagnosis:  [place]-3.3 Overweight/obesity As related to hx of energy dense food choices and sugary beverages.  As evidenced by BMI of 35.9    Intervention:  Nutrition counseling. Encouraged continuing with some of the dietary changes she has made, brown rice, more veggies. Discussed strategies to decrease mindless  eating while watching TV.  Plan: Continue having the brown rice and stir fry veggies. Continue with your plan to stock up on snack that you won't be overeat - Plan to eat something before you go shopping to help you resist temptation. Continue your strategies to ask yourself if you really want foods that you know you should be eating less off. Tacos are fine for a lunch choice, better if you can also include some non-starchy veggies Continue playing games while watching tv instead of eating chips. Continue stretching, aim to do everyday and do it in moderation so it doesn't hurt, listen to your body.  Teaching Method Utilized:  Visual Auditory  Handouts given during visit include: none  Barriers to learning/adherence to lifestyle change: limited finances and reduced mobility due to effects of stroke  Demonstrated degree of understanding via:  Teach Back   Monitoring/Evaluation:  Dietary intake, exercise, and body weight in 2 month(s).

## 2019-02-02 ENCOUNTER — Other Ambulatory Visit: Payer: Self-pay | Admitting: Interventional Cardiology

## 2019-03-12 ENCOUNTER — Ambulatory Visit: Payer: Medicare Other | Admitting: Registered"

## 2019-03-16 DIAGNOSIS — I6789 Other cerebrovascular disease: Secondary | ICD-10-CM | POA: Diagnosis not present

## 2019-04-04 ENCOUNTER — Other Ambulatory Visit (HOSPITAL_COMMUNITY): Payer: Medicare Other

## 2019-04-12 ENCOUNTER — Encounter: Payer: Medicare Other | Attending: Family Medicine | Admitting: Registered"

## 2019-04-12 ENCOUNTER — Other Ambulatory Visit: Payer: Self-pay

## 2019-04-12 DIAGNOSIS — I252 Old myocardial infarction: Secondary | ICD-10-CM | POA: Diagnosis not present

## 2019-04-12 DIAGNOSIS — R635 Abnormal weight gain: Secondary | ICD-10-CM | POA: Insufficient documentation

## 2019-04-12 DIAGNOSIS — Z713 Dietary counseling and surveillance: Secondary | ICD-10-CM

## 2019-04-12 DIAGNOSIS — I119 Hypertensive heart disease without heart failure: Secondary | ICD-10-CM | POA: Diagnosis not present

## 2019-04-12 NOTE — Progress Notes (Signed)
Follow Up for Medical Nutrition Therapy:  Appt start time: 1210 end time:  1240  Assessment:  Patient is here for follow-up for weight management.  When asked what her goals are first she stated to lose her belly fat and be healthy. When pushing her a little further asking was behavior goals, like exercise and food related, she stated she wants to buy a 2 bed 2 bath house with a garage, big kitchen, fix it up how she likes then she would enjoy life and her life would be perfect. 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 Pt states the little girl who she was babysitting called her on Mother's Day which made her very happy.  Instead of eating chips, candy at night she now has tangerines for dessert. Pt states she watches my 600 lb Life because it motivates her to exercise in bed with resistance bands and works out enough to make her breathe harder. P states she cannot get on floor to exercise because she can't get up.   Pt states she watches Vassie Loll to motivate her to cook and tried a steak strips & veggie stir fry and no-sugar chocolate treat she enjoyed.  Pt states with the COVID restrictions, she has nothing to do but eat. Pt states her 67-yr old grandson is staying with her, just started working at [employer], and they practice social distancing in the house.  Pt sates she tries to eat a lot of vegetables, when asked why is it hard and she states that during the fast (Ramadan) she doesn't want to cook late in the evening when breaking the fast. During dietary recall she stated she ate breakfast ~8 am.  Pt states she wants a companion animal, dog, but doesn't want to spend $50/mo extra on rent. Pt states if it is a service dog that they won't charge her the extra money.  Preferred Learning Style:   No preference indicated   Learning Readiness:   Change in  progress  MEDICATIONS: reviewed   DIETARY INTAKE:   Avoided foods include pork and tuna fish.  24-hr recall:  B ( AM):grits, fruit cup (between 8 and 8:30) Snk ( AM):  none L ( PM):  none Snk ( PM): none D ( PM): baked chicken, cornbread, mashed potatoes,  Snk ( PM): tangerines while watching TV Beverages: water at night, drop-ins, AZ tea, diet soda sometimes   Usual physical activity: ADLs. Pt states she hasn't been walking because it has been too cold, but plans to walk today since it is going to be warmer.  Estimated energy needs: 1600 calories 180 g carbohydrates 120 g protein 44 g fat  Progress Towards Goal(s):  in progress.    Nutritional Diagnosis:  [place]-3.3 Overweight/obesity As related to hx of energy dense food choices and sugary beverages.  As evidenced by BMI of 35.9    Intervention:  Nutrition counseling. Encouraged continuing with some of the dietary changes she has made, cooking with veggies, eating fruit. Discussed what motivates her to make positive changes. (TV shows)  Plan: Continue exercises to raise up your heart rate. Continue trying healthy recipes like you see on [employer]. If you can't find frozen of stir-fry veggies, look for fresh cut up veggies Continue including vegetables with your meals and snacks.  Teaching Method Utilized:  Visual Auditory  Handouts  given during visit include: none  Barriers to learning/adherence to lifestyle change: limited finances and reduced mobility due to effects of stroke  Demonstrated degree of understanding via:  Teach Back   Monitoring/Evaluation:  Dietary intake, exercise, and body weight in 3 month(s).

## 2019-04-12 NOTE — Patient Instructions (Addendum)
Continue exercises to raise up your heart rate. Continue trying healthy recipes like you see on [employer]. If you can't find frozen of stir-fry veggies, look for fresh cut up veggies Continue including vegetables with your meals and snacks.

## 2019-04-16 DIAGNOSIS — I1310 Hypertensive heart and chronic kidney disease without heart failure, with stage 1 through stage 4 chronic kidney disease, or unspecified chronic kidney disease: Secondary | ICD-10-CM | POA: Diagnosis not present

## 2019-04-16 DIAGNOSIS — I69952 Hemiplegia and hemiparesis following unspecified cerebrovascular disease affecting left dominant side: Secondary | ICD-10-CM | POA: Diagnosis not present

## 2019-04-16 DIAGNOSIS — Z0000 Encounter for general adult medical examination without abnormal findings: Secondary | ICD-10-CM | POA: Diagnosis not present

## 2019-04-16 DIAGNOSIS — I10 Essential (primary) hypertension: Secondary | ICD-10-CM | POA: Diagnosis not present

## 2019-04-24 ENCOUNTER — Ambulatory Visit: Payer: Medicare Other | Admitting: Interventional Cardiology

## 2019-04-30 DIAGNOSIS — E785 Hyperlipidemia, unspecified: Secondary | ICD-10-CM | POA: Diagnosis not present

## 2019-04-30 DIAGNOSIS — I69952 Hemiplegia and hemiparesis following unspecified cerebrovascular disease affecting left dominant side: Secondary | ICD-10-CM | POA: Diagnosis not present

## 2019-05-03 ENCOUNTER — Other Ambulatory Visit: Payer: Self-pay | Admitting: Interventional Cardiology

## 2019-05-03 ENCOUNTER — Other Ambulatory Visit: Payer: Self-pay

## 2019-05-03 ENCOUNTER — Ambulatory Visit: Payer: Medicare Other | Admitting: Orthotics

## 2019-05-03 DIAGNOSIS — M21372 Foot drop, left foot: Secondary | ICD-10-CM

## 2019-05-03 DIAGNOSIS — M1712 Unilateral primary osteoarthritis, left knee: Secondary | ICD-10-CM

## 2019-05-03 DIAGNOSIS — Z8673 Personal history of transient ischemic attack (TIA), and cerebral infarction without residual deficits: Secondary | ICD-10-CM

## 2019-05-03 DIAGNOSIS — M659 Synovitis and tenosynovitis, unspecified: Secondary | ICD-10-CM

## 2019-05-03 MED ORDER — ATORVASTATIN CALCIUM 10 MG PO TABS: ORAL_TABLET | ORAL | 0 refills | Status: DC

## 2019-05-03 NOTE — Progress Notes (Signed)
Added zote padding to take pressure off of lateral aspect of foot.

## 2019-05-15 DIAGNOSIS — H25813 Combined forms of age-related cataract, bilateral: Secondary | ICD-10-CM | POA: Diagnosis not present

## 2019-05-30 DIAGNOSIS — E785 Hyperlipidemia, unspecified: Secondary | ICD-10-CM | POA: Diagnosis not present

## 2019-05-30 DIAGNOSIS — I119 Hypertensive heart disease without heart failure: Secondary | ICD-10-CM | POA: Diagnosis not present

## 2019-05-30 DIAGNOSIS — I10 Essential (primary) hypertension: Secondary | ICD-10-CM | POA: Diagnosis not present

## 2019-06-19 ENCOUNTER — Other Ambulatory Visit: Payer: Self-pay

## 2019-06-19 NOTE — Patient Outreach (Signed)
[facility]) Care Management  06/19/2019  Blakely Maranan [birth year] [id number]   Medication Adherence call to Mrs. Quinn Plowman HIPPA Compliant Voice message left with a call back number. Mrs. Trivett is showing past due on Atorvastatin 10 mg under [place].   [place] Management Direct Dial [phone]  Fax [phone] [email or web address]

## 2019-07-05 ENCOUNTER — Telehealth: Payer: Self-pay | Admitting: Interventional Cardiology

## 2019-07-05 NOTE — Telephone Encounter (Signed)
New message   Patient is calling in to see if she can get a prescription sent over to [place] for a Rollator walker. She has been having issues with keeping her balance with her cane. Please give patient a call back to assist.

## 2019-07-05 NOTE — Telephone Encounter (Signed)
Spoke with pt and advised for her to reach out to her PCP about getting this ordered as we do not typically order equipment for pts.  Pt verbalized understanding.

## 2019-07-06 ENCOUNTER — Other Ambulatory Visit: Payer: Self-pay

## 2019-07-06 NOTE — Patient Outreach (Signed)
[facility]) Care Management  07/06/2019  Chelsea [name] 07/02/1951 [id number]   Medication Adherence call to Mrs. Chelsea [name]  Patient did not answer patient is past due on Atorvastatin 10 mg under [place].   [place] Management Direct Dial [phone]  Fax [phone] [email or web address]

## 2019-07-11 ENCOUNTER — Encounter: Payer: Self-pay | Admitting: Registered"

## 2019-07-11 ENCOUNTER — Encounter: Payer: Medicare Other | Attending: Family Medicine | Admitting: Registered"

## 2019-07-11 ENCOUNTER — Other Ambulatory Visit: Payer: Self-pay

## 2019-07-11 DIAGNOSIS — Z79899 Other long term (current) drug therapy: Secondary | ICD-10-CM | POA: Insufficient documentation

## 2019-07-11 DIAGNOSIS — E119 Type 2 diabetes mellitus without complications: Secondary | ICD-10-CM | POA: Insufficient documentation

## 2019-07-11 DIAGNOSIS — I252 Old myocardial infarction: Secondary | ICD-10-CM | POA: Insufficient documentation

## 2019-07-11 DIAGNOSIS — R635 Abnormal weight gain: Secondary | ICD-10-CM | POA: Insufficient documentation

## 2019-07-11 DIAGNOSIS — Z713 Dietary counseling and surveillance: Secondary | ICD-10-CM | POA: Diagnosis not present

## 2019-07-11 DIAGNOSIS — I119 Hypertensive heart disease without heart failure: Secondary | ICD-10-CM | POA: Diagnosis not present

## 2019-07-11 NOTE — Patient Instructions (Addendum)
Goals: Call MD office about getting request faxed for Rolator When more confident about balance increase walking. Look for your insulated bag that has a strap that crosses the body so you can carry water with you. Good job improving your eating habits, and choosing fruits to snack on instead of chips & cookies.
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
## 2019-07-12 ENCOUNTER — Ambulatory Visit (HOSPITAL_COMMUNITY): Payer: Medicare Other | Attending: Cardiovascular Disease

## 2019-07-12 ENCOUNTER — Other Ambulatory Visit: Payer: Self-pay

## 2019-07-12 DIAGNOSIS — I351 Nonrheumatic aortic (valve) insufficiency: Secondary | ICD-10-CM | POA: Diagnosis not present

## 2019-07-12 DIAGNOSIS — I69952 Hemiplegia and hemiparesis following unspecified cerebrovascular disease affecting left dominant side: Secondary | ICD-10-CM | POA: Diagnosis not present

## 2019-07-18 ENCOUNTER — Encounter: Payer: Self-pay | Admitting: Interventional Cardiology

## 2019-07-18 ENCOUNTER — Other Ambulatory Visit: Payer: Self-pay

## 2019-07-18 ENCOUNTER — Ambulatory Visit (INDEPENDENT_AMBULATORY_CARE_PROVIDER_SITE_OTHER): Payer: Medicare Other | Admitting: Interventional Cardiology

## 2019-07-18 VITALS — BP 122/72 | HR 58 | Ht 66.0 in | Wt 203.0 lb

## 2019-07-18 DIAGNOSIS — I10 Essential (primary) hypertension: Secondary | ICD-10-CM

## 2019-07-18 DIAGNOSIS — I351 Nonrheumatic aortic (valve) insufficiency: Secondary | ICD-10-CM

## 2019-07-18 DIAGNOSIS — Z7189 Other specified counseling: Secondary | ICD-10-CM

## 2019-07-18 DIAGNOSIS — I719 Aortic aneurysm of unspecified site, without rupture: Secondary | ICD-10-CM

## 2019-07-18 DIAGNOSIS — E7849 Other hyperlipidemia: Secondary | ICD-10-CM | POA: Diagnosis not present

## 2019-07-18 NOTE — Patient Instructions (Addendum)
Medication Instructions:  Your physician recommends that you continue on your current medications as directed. Please refer to the Current Medication list given to you today.  If you need a refill on your cardiac medications before your next appointment, please call your pharmacy.   Lab work: None If you have labs (blood work) drawn today and your tests are completely normal, you will receive your results only by: Marland Kitchen MyChart Message (if you have MyChart) OR . A paper copy in the mail If you have any lab test that is abnormal or we need to change your treatment, we will call you to review the results.  Testing/Procedures: Your physician has requested that you have an echocardiogram one week prior to seeing Dr. Tamala Julian back in August 2021. Echocardiography is a painless test that uses sound waves to create images of your heart. It provides your doctor with information about the size and shape of your heart and how well your heart's chambers and valves are working. This procedure takes approximately one hour. There are no restrictions for this procedure.  Your physician recommends that you have a CT Angio Aorta to evaluate your Aorta sometime in the next 6-8 months.  Follow-Up: At [facility], you and your health needs are our priority.  As part of our continuing mission to provide you with exceptional heart care, we have created designated Provider Care Teams.  These Care Teams include your primary Cardiologist (physician) and Advanced Practice Providers (APPs -  Physician Assistants and Nurse Practitioners) who all work together to provide you with the care you need, when you need it. You will need a follow up appointment in 12 months.  Please call our office 2 months in advance to schedule this appointment.  You may see Dr. Tamala Julian or one of the following Advanced Practice Providers on your designated Care Team:   Truitt Merle, NP Cecilie Kicks, NP . Kathyrn Drown, NP  Any Other Special  Instructions Will Be Listed Below (If Applicable).

## 2019-07-18 NOTE — Addendum Note (Signed)
Addended by: Loren Racer on: 07/18/2019 02:38 PM   Modules accepted: Orders

## 2019-07-18 NOTE — Progress Notes (Signed)
Cardiology Office Note:    Date:  07/18/2019   ID:  Chelsea [name], DOB Mar 18, 1951, MRN [id number]  PCP:  Lucianne Lei, MD  Cardiologist:  No primary care provider on file.   Referring MD: Lucianne Lei, MD   Chief Complaint  Patient presents with  . Thoracic Aortic Aneurysm  . Cardiac Valve Problem    Regurgitation    History of Present Illness:    Chelsea [name] is a 68 y.o. female with a hx of mild aortic regurgitation, non-cardiac chest pain, prior history of stroke with residual left-sided weakness, hyperlipidemia, hypertension, and moderate obesity.  He is doing well.  She has no cardiopulmonary complaints.  She has hypertensive heart disease and presumed aortic enlargement/aneurysm, prior CVA, left ventricular hypertrophy, and aortic regurgitation all related to endorgan damage from previously poorly controlled hypertension.  She denies orthopnea, PND, chest pain.  She has not had palpitations or syncope.  There is no peripheral edema.  She is compliant with her medication regimen.  Past Medical History:  Diagnosis Date  . Anxiety   . Cerebrovascular accident [facility])    a. 2007-->residual left sided weakness  . Chest pain   . Depression   . GERD (gastroesophageal reflux disease)   . Headache   . Hx of hysterectomy   . Hyperlipidemia   . Hypertension   . Mild to Moderate Aortic Insufficiency    a. 08/2014 Echo: EF 55-60%, mild conc LVH, no rwma, Gr 1 DD, mild-mod AI, mild MR.  . Morbid obesity ([place])   . Obesity   . Peptic ulcer disease     Past Surgical History:  Procedure Laterality Date  . ABDOMINAL HYSTERECTOMY    . TOTAL KNEE ARTHROPLASTY Left 01/05/2016   Procedure: TOTAL KNEE ARTHROPLASTY;  Surgeon: Frederik Pear, MD;  Location: [place];  Service: Orthopedics;  Laterality: Left;  . TUBAL LIGATION      Current Medications: Current Meds  Medication Sig  . amLODipine-valsartan (EXFORGE) 10-320 MG tablet Take 1 tablet by mouth daily.  Marland Kitchen aspirin EC 81 MG  tablet Take 1 tablet (81 mg total) by mouth daily.  Marland Kitchen atorvastatin (LIPITOR) 10 MG tablet TAKE 1 TABLET(10 MG) BY MOUTH DAILY. Please keep upcoming appt in August with Dr. Tamala Julian for future refills. Thank you  . Calcium-Magnesium-Vitamin D (CALCIUM 500 PO) Take 1 tablet by mouth daily.   . citalopram (CELEXA) 10 MG tablet Take 20 mg by mouth daily.   . cloNIDine (CATAPRES) 0.3 MG tablet TAKE 1 TABLET BY MOUTH TWICE DAILY  . cyanocobalamin 100 MCG tablet Take 100 mcg by mouth daily.   Marland Kitchen doxazosin (CARDURA) 8 MG tablet Take 8 mg by mouth at bedtime.  Marland Kitchen ezetimibe (ZETIA) 10 MG tablet Take 10 mg by mouth daily.  . furosemide (LASIX) 20 MG tablet Take 20 mg by mouth 2 (two) times daily.   Marland Kitchen gabapentin (NEURONTIN) 800 MG tablet Take 1 tablet (800 mg total) by mouth 2 (two) times daily.  . mirabegron ER (MYRBETRIQ) 25 MG TB24 tablet Take 25 mg by mouth daily.  . Multiple Vitamin (MULTIVITAMIN) tablet Take 1 tablet by mouth daily.    . pantoprazole (PROTONIX) 40 MG tablet Take 40 mg by mouth 2 (two) times daily.   . TRAMADOL HCL ER PO Take 1 tablet by mouth every 6 (six) hours as needed (PAIN).      Allergies:   Other and Hydrocodone-acetaminophen   Social History   Socioeconomic History  . Marital status: Married  Spouse name: Not on file  . Number of children: 3  . Years of education: 47  . Highest education level: Not on file  Occupational History  . 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 Financial resource strain: Not on file  . Food insecurity    Worry: Not on file    Inability: Not on file  . Transportation needs    Medical: Not on file    Non-medical: Not on file  Tobacco Use  . Smoking status: Current Every Day Smoker  . Smokeless tobacco: Never Used  Substance and Sexual Activity  . Alcohol use: No    Alcohol/week: 0.0 standard drinks  . Drug use: No  . Sexual activity: Not Currently  Lifestyle  .  Physical activity    Days per week: Not on file    Minutes per session: Not on file  . Stress: Not on file  Relationships  . Social Herbalist on phone: Not on file    Gets together: Not on file    Attends religious service: Not on file    Active member of club or organization: Not on file    Attends meetings of clubs or organizations: Not on file    Relationship status: Not on file  Other Topics Concern  . Not on file  Social History Narrative   Patient consumes no caffeine     Family History: The patient's family history includes Cancer in her brother; Cirrhosis in her father; Drug abuse in her brother; Heart attack in an other family member; Hypertension in her mother; Peripheral vascular disease in her mother. There is no history of Breast cancer.  ROS:   Please see the history of present illness.    Left-sided hemiparesis.  Otherwise no complaints.  She denies headache and neurological complaints.  All other systems reviewed and are negative.  EKGs/Labs/Other Studies Reviewed:    The following studies were reviewed today:  2D Doppler echocardiogram July 12, 2019 IMPRESSIONS    1. The left ventricle has normal systolic function, with an ejection fraction of 55-60%. The cavity size was mildly dilated. Left ventricular diastolic Doppler parameters are consistent with pseudonormalization. Elevated left ventricular end-diastolic  pressure.  2. The right ventricle has normal systolic function. The cavity was normal. There is no increase in right ventricular wall thickness.  3. Left atrial size was mildly dilated.  4. Mild thickening of the mitral valve leaflet. Mild calcification of the mitral valve leaflet. There is mild mitral annular calcification present.  5. The aortic valve is tricuspid. Moderate thickening of the aortic valve. Moderate calcification of the aortic valve. Aortic valve regurgitation is moderate by color flow Doppler.  6. AR appears worse than  2017.  7. The aorta is abnormal unless otherwise noted.  8. There is mild dilatation of the aortic root measuring 43 mm.   EKG:  EKG is bradycardia, 58 bpm, prominent voltage consistent with left ventricular hypertrophy.  Poor R wave progression and leftward axis.  Rate is slightly slower when compared to May 2019.  Recent Labs: No results found for requested labs within last 8760 hours.  Recent Lipid Panel    Component Value Date/Time   CHOL 134 04/20/2016 1409   TRIG 99 04/20/2016 1409   HDL 54 04/20/2016 1409   CHOLHDL 2.5 04/20/2016 1409   VLDL 20 04/20/2016 1409   LDLCALC 60 04/20/2016 1409  Physical Exam:    VS:  BP 122/72   Pulse (!) 58   Ht 5\' 6"  (1.676 m)   Wt 203 lb (92.1 kg)   SpO2 99%   BMI 32.77 kg/m     Wt Readings from Last 3 Encounters:  07/18/19 203 lb (92.1 kg)  10/12/18 208 lb 9.6 oz (94.6 kg)  04/12/18 207 lb 12.8 oz (94.3 kg)     GEN: Mild obesity. No acute distress HEENT: Normal NECK: No JVD. LYMPHATICS: No lymphadenopathy CARDIAC:  RRR with soft systolic and diastolic left upper parasternal border murmur.  No gallop, or edema. VASCULAR:  Normal Pulses. No bruits. RESPIRATORY:  Clear to auscultation without rales, wheezing or rhonchi  ABDOMEN: Soft, non-tender, non-distended, No pulsatile mass, MUSCULOSKELETAL: No deformity  SKIN: Warm and dry NEUROLOGIC:  Alert and oriented x 3 PSYCHIATRIC:  Normal affect   ASSESSMENT:    1. Nonrheumatic aortic valve insufficiency   2. Other hyperlipidemia   3. Essential hypertension   4. Morbid obesity ([place])   5. Educated About Covid-19 Virus Infection   6. Aortic aneurysm without rupture, unspecified portion of aorta (HCC)    PLAN:    In order of problems listed above:  1. I am suspicious that the patient's aortic regurgitation is due to aortic root dilatation from longstanding hypertension.  The regurgitation is difficult to hear auscultation.  Low grade of the regurgitation is mild to  moderate on echo which is not significantly that different than the prior study.  2D Doppler echocardiogram prior to the next office visit. 2. LDL is currently 83 when checked in May.  This is adequate. 3. Blood pressure control is excellent at and below the target of 130/80 mmHg. 4. Encouraged decrease caloric intake to help prevent progressive weight gain.  Encouraged aerobic activity. 5. Social distancing, handwashing, and masking is encouraged to prevent COVID 19 spread 6. CT angios of the ascending aorta/chest to specifically sized the aortic aneurysm.   Greater than 50% of the time during this office visit was spent in education, counseling, and coordination of care related to underlying disease process and testing as outlined.   Medication Adjustments/Labs and Tests Ordered: Current medicines are reviewed at length with the patient today.  Concerns regarding medicines are outlined above.  No orders of the defined types were placed in this encounter.  No orders of the defined types were placed in this encounter.   There are no Patient Instructions on file for this visit.   Signed, Sinclair Grooms, MD  07/18/2019 2:20 PM    [facility]

## 2019-07-31 DIAGNOSIS — E782 Mixed hyperlipidemia: Secondary | ICD-10-CM | POA: Diagnosis not present

## 2019-07-31 DIAGNOSIS — I10 Essential (primary) hypertension: Secondary | ICD-10-CM | POA: Diagnosis not present

## 2019-08-08 ENCOUNTER — Other Ambulatory Visit: Payer: Self-pay

## 2019-08-08 ENCOUNTER — Telehealth: Payer: Self-pay | Admitting: Interventional Cardiology

## 2019-08-08 ENCOUNTER — Telehealth: Payer: Self-pay | Admitting: Adult Health

## 2019-08-08 MED ORDER — ATORVASTATIN CALCIUM 10 MG PO TABS: ORAL_TABLET | ORAL | 3 refills | Status: DC

## 2019-08-08 NOTE — Telephone Encounter (Signed)
Pt is not in need of a refill but she did want it documented that she will no longer get her gabapentin (NEURONTIN) 800 MG tablet Pt is asking that it be filled thru [facility] only going forward.  This is an Pharmacist, hospital

## 2019-08-08 NOTE — Telephone Encounter (Signed)
New Message    *STAT* If patient is at the pharmacy, call can be transferred to refill team.   1. Which medications need to be refilled? (please list name of each medication and dose if known) atorvastatin (LIPITOR) 10 MG tablet   2. Which pharmacy/location (including street and city if local pharmacy) is medication to be sent to? [facility]  3. Do they need a 30 day or 90 day supply? 90   Rx should be sent to [facility] mail order

## 2019-08-08 NOTE — Telephone Encounter (Signed)
Noted  

## 2019-08-09 MED ORDER — ATORVASTATIN CALCIUM 10 MG PO TABS: ORAL_TABLET | ORAL | 3 refills | Status: DC

## 2019-08-09 NOTE — Addendum Note (Signed)
Addended by: Derl Barrow on: 08/09/2019 03:08 PM   Modules accepted: Orders

## 2019-08-10 DIAGNOSIS — Z1211 Encounter for screening for malignant neoplasm of colon: Secondary | ICD-10-CM | POA: Diagnosis not present

## 2019-08-10 DIAGNOSIS — K219 Gastro-esophageal reflux disease without esophagitis: Secondary | ICD-10-CM | POA: Diagnosis not present

## 2019-08-14 ENCOUNTER — Other Ambulatory Visit: Payer: Self-pay | Admitting: *Deleted

## 2019-08-14 MED ORDER — GABAPENTIN 800 MG PO TABS: 800.0000 mg | ORAL_TABLET | Freq: Two times a day (BID) | ORAL | 3 refills | Status: DC

## 2019-08-23 ENCOUNTER — Telehealth: Payer: Self-pay | Admitting: Interventional Cardiology

## 2019-08-23 NOTE — Progress Notes (Signed)
Virtual Visit via Telephone Note   This visit type was conducted due to national recommendations for restrictions regarding the COVID-19 Pandemic (e.g. social distancing) in an effort to limit this patient's exposure and mitigate transmission in our community.  Due to her co-morbid illnesses, this patient is at least at moderate risk for complications without adequate follow up.  This format is felt to be most appropriate for this patient at this time.  The patient did not have access to video technology/had technical difficulties with video requiring transitioning to audio format only (telephone).  All issues noted in this document were discussed and addressed.  No physical exam could be performed with this format.  Please refer to the patient's chart for her  consent to telehealth for [facility].   Date:  08/24/2019   ID:  Chelsea [name], DOB [birth year], MRN [id number]  Patient Location: Home Provider Location: Office  PCP:  Lucianne Lei, MD  Cardiologist:  No primary care provider on file.  Electrophysiologist:  None   Evaluation Performed:  Follow-Up Visit  Chief Complaint:  Chest pain  History of Present Illness:      Chelsea [name] is a 68 y.o. female with a history ofmild aortic regurgitation, non-cardiac chest pain, prior history of stroke with residual left-sided weakness, hyperlipidemia, hypertension, and moderate obesity.  She is followed by Linard Millers .  Just saw him in August   The pt called in yesterday complaining of CP   Told to go to ED if got worse or set up for televist  Talking to the pt by phone, she says on Wed she got the flu shot and then went shopping   She remembers reaching for a shoe box above her head and twisting a little  Yesterday AM she woke up with CP   Pain worse with inspiration   Also worse is she moved a certain way   Not worse with laying back She denies cough, fevers or chiilss  No one sick at home     Today she woke up and the  discomfort is miinimal    She does say it is worse with pushing on chest (sternal area); mild   The patient does not have symptoms concerning for COVID-19 infection (fever, chills, cough, or new shortness of breath).    Past Medical History:  Diagnosis Date  . Anxiety   . Cerebrovascular accident [facility])    a. 2007-->residual left sided weakness  . Chest pain   . Depression   . GERD (gastroesophageal reflux disease)   . Headache   . Hx of hysterectomy   . Hyperlipidemia   . Hypertension   . Mild to Moderate Aortic Insufficiency    a. 08/2014 Echo: EF 55-60%, mild conc LVH, no rwma, Gr 1 DD, mild-mod AI, mild MR.  . Morbid obesity ([place])   . Obesity   . Peptic ulcer disease    Past Surgical History:  Procedure Laterality Date  . ABDOMINAL HYSTERECTOMY    . 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 TUBAL LIGATION       Current Meds  Medication Sig  . amLODipine-valsartan (EXFORGE) 10-320 MG tablet Take 1 tablet by mouth daily.  Marland Kitchen aspirin EC 81 MG tablet Take 1 tablet (81 mg total) by mouth daily.  Marland Kitchen atorvastatin (LIPITOR) 10 MG tablet TAKE 1 TABLET(10 MG) BY MOUTH DAILY.  Marland Kitchen  citalopram (CELEXA) 10 MG tablet Take 20 mg by mouth daily.   . cloNIDine (CATAPRES) 0.3 MG tablet TAKE 1 TABLET BY MOUTH TWICE DAILY  . cyanocobalamin 100 MCG tablet Take 100 mcg by mouth daily.   Marland Kitchen doxazosin (CARDURA) 8 MG tablet Take 8 mg by mouth at bedtime.  Marland Kitchen ezetimibe (ZETIA) 10 MG tablet Take 10 mg by mouth daily.  . furosemide (LASIX) 20 MG tablet Take 20 mg by mouth 2 (two) times daily.   Marland Kitchen gabapentin (NEURONTIN) 800 MG tablet Take 1 tablet (800 mg total) by mouth 2 (two) times daily.  . indapamide (LOZOL) 2.5 MG tablet Take 2.5 mg by mouth daily.  . mirabegron ER (MYRBETRIQ) 25 MG TB24 tablet Take 25 mg by mouth daily.  . Multiple Vitamin (MULTIVITAMIN) tablet Take 1  tablet by mouth daily.    . pantoprazole (PROTONIX) 40 MG tablet Take 40 mg by mouth 2 (two) times daily.   . potassium chloride SA (K-DUR) 20 MEQ tablet Take 20 mEq by mouth daily.     Allergies:   Other and Hydrocodone-acetaminophen   Social History   Tobacco Use  . Smoking status: Current Every Day Smoker  . Smokeless tobacco: Never Used  Substance Use Topics  . Alcohol use: No    Alcohol/week: 0.0 standard drinks  . Drug use: No     Family Hx: The patient's family history includes Cancer in her brother; Cirrhosis in her father; Drug abuse in her brother; Heart attack in an other family member; Hypertension in her mother; Peripheral vascular disease in her mother. There is no history of Breast cancer.  ROS:   Please see the history of present illness.     All other systems reviewed and are negative.   Prior CV studies:   The following studies were reviewed today:    Labs/Other Tests and Data Reviewed:    EKG:  No ECG reviewed.  Recent Labs: No results found for requested labs within last 8760 hours.   Recent Lipid Panel Lab Results  Component Value Date/Time   CHOL 134 04/20/2016 02:09 PM   TRIG 99 04/20/2016 02:09 PM   HDL 54 04/20/2016 02:09 PM   CHOLHDL 2.5 04/20/2016 02:09 PM   LDLCALC 60 04/20/2016 02:09 PM    Wt Readings from Last 3 Encounters:  08/24/19 199 lb (90.3 kg)  07/18/19 203 lb (92.1 kg)  10/12/18 208 lb 9.6 oz (94.6 kg)     Objective:    Vital Signs:  Ht 5\' 6"  (1.676 m)   Wt 199 lb (90.3 kg)   BMI 32.12 kg/m    VITAL SIGNS:  reviewed no vital signs  ASSESSMENT & PLAN:    1. CP  Atypical for cardiac   Appears she may have strained herself  Musculoskeletal  2  Aortic regurgitation  Recent echo AI appears moderate  3 HTN  BP will need to be followed  4  Obesity.  Long term f/u needed    5COVID-19 Education: The signs and symptoms of COVID-19 were discussed with the patient and how to seek care for testing (follow up with PCP  or arrange E-visit).  The importance of social distancing was discussed today.  Time:   Today, I have spent 15  minutes with the patient with telehealth technology discussing the above problems.     Medication Adjustments/Labs and Tests Ordered: Current medicines are reviewed at length with the patient today.  Concerns regarding medicines are outlined above.   Tests Ordered: No  orders of the defined types were placed in this encounter.   Medication Changes: No orders of the defined types were placed in this encounter.   Follow Up:  In Person as previously scheduled  Signed, Dorris Carnes, MD  08/24/2019 8:47 AM    [place]   Cardiology Office Note   Date:  08/24/2019   ID:  Chelsea [name], Chelsea [name] 1951-03-08, MRN [id number]  PCP:  Lucianne Lei, MD  Cardiologist:   Dorris Carnes, MD    Pt for televist because of CP     History of Present Illness: Bonney Meins is a 68 y.o. female with a history ofmild aortic regurgitation, non-cardiac chest pain, prior history of stroke with residual left-sided weakness, hyperlipidemia, hypertension, and moderate obesity.  She is followed by Linard Millers .  Just saw him in August   The pt called in yesterday complaining of CP   Told to go to ED if got worse or set up for televist  Talking to the pt by phone, she says on Wed she got the flu shot and then went shopping   She remembers reaching for a shoe box above her head and twisting a little  Yesterday AM she woke up with CP   Pain worse with inspiration   Also worse is she moved a certain way   Not worse with laying back She denies cough, fevers or chiilss  No one sick at home     Today she woke up and the discomfort is miinimal    She does say it is worse with pushing on chest (sternal area); mild   Current Meds  Medication Sig  . amLODipine-valsartan (EXFORGE) 10-320 MG tablet Take 1 tablet by mouth daily.  Marland Kitchen aspirin EC 81 MG tablet Take 1 tablet (81 mg  total) by mouth daily.  Marland Kitchen atorvastatin (LIPITOR) 10 MG tablet TAKE 1 TABLET(10 MG) BY MOUTH DAILY.  . citalopram (CELEXA) 10 MG tablet Take 20 mg by mouth daily.   . cloNIDine (CATAPRES) 0.3 MG tablet TAKE 1 TABLET BY MOUTH TWICE DAILY  . cyanocobalamin 100 MCG tablet Take 100 mcg by mouth daily.   Marland Kitchen doxazosin (CARDURA) 8 MG tablet Take 8 mg by mouth at bedtime.  Marland Kitchen ezetimibe (ZETIA) 10 MG tablet Take 10 mg by mouth daily.  . furosemide (LASIX) 20 MG tablet Take 20 mg by mouth 2 (two) times daily.   Marland Kitchen gabapentin (NEURONTIN) 800 MG tablet Take 1 tablet (800 mg total) by mouth 2 (two) times daily.  . indapamide (LOZOL) 2.5 MG tablet Take 2.5 mg by mouth daily.  . mirabegron ER (MYRBETRIQ) 25 MG TB24 tablet Take 25 mg by mouth daily.  . Multiple Vitamin (MULTIVITAMIN) tablet Take 1 tablet by mouth daily.    . pantoprazole (PROTONIX) 40 MG tablet Take 40 mg by mouth 2 (two) times daily.   . potassium chloride SA (K-DUR) 20 MEQ tablet Take 20 mEq by mouth daily.     Allergies:   Other and Hydrocodone-acetaminophen   Past Medical History:  Diagnosis Date  . Anxiety   . Cerebrovascular accident [facility])    a. 2007-->residual left sided weakness  . Chest pain   . Depression   . GERD (gastroesophageal reflux disease)   . Headache   . Hx of hysterectomy   . Hyperlipidemia   . Hypertension   . Mild to Moderate Aortic Insufficiency    a. 08/2014 Echo: EF 55-60%, mild conc LVH, no rwma, Gr 1  DD, mild-mod AI, mild MR.  . Morbid obesity ([place])   . Obesity   . Peptic ulcer disease     Past Surgical History:  Procedure Laterality Date  . ABDOMINAL HYSTERECTOMY    . 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 TUBAL LIGATION       Social History:  The patient  reports that she has been smoking. She has never used smokeless tobacco. She reports that she does not drink  alcohol or use drugs.   Family History:  The patient's family history includes Cancer in her brother; Cirrhosis in her father; Drug abuse in her brother; Heart attack in an other family member; Hypertension in her mother; Peripheral vascular disease in her mother.    ROS:  Please see the history of present illness. All other systems are reviewed and  Negative to the above problem except as noted.    PHYSICAL EXAM: VS:  Ht 5\' 6"  (1.676 m)   Wt 199 lb (90.3 kg)   BMI 32.12 kg/m   GEN: Well nourished, well developed, in no acute distress  HEENT: normal  Neck: no JVD, carotid bruits, or masses Cardiac: RRR; no murmurs, rubs, or gallops,no edema  Respiratory:  clear to auscultation bilaterally, normal work of breathing GI: soft, nontender, nondistended, + BS  No hepatomegaly  MS: no deformity Moving all extremities   Skin: warm and dry, no rash Neuro:  Strength and sensation are intact Psych: euthymic mood, full affect   EKG:  EKG is ordered today.   Lipid Panel    Component Value Date/Time   CHOL 134 04/20/2016 1409   TRIG 99 04/20/2016 1409   HDL 54 04/20/2016 1409   CHOLHDL 2.5 04/20/2016 1409   VLDL 20 04/20/2016 1409   LDLCALC 60 04/20/2016 1409      Wt Readings from Last 3 Encounters:  08/24/19 199 lb (90.3 kg)  07/18/19 203 lb (92.1 kg)  10/12/18 208 lb 9.6 oz (94.6 kg)      ASSESSMENT AND PLAN:     Current medicines are reviewed at length with the patient today.  The patient does not have concerns regarding medicines.  Signed, Dorris Carnes, MD  08/24/2019 8:46 AM    [address] [place], [place], [place]  [address] Phone: [phone]; Fax: [phone]

## 2019-08-23 NOTE — Telephone Encounter (Signed)
Called patient who states she is having a "nagging" feeling between her chest since she woke up this morning. States it got better after taking ASA but she still feels it. Denies that it woke her up or that it changes with movement. Denies heartburn, n/v, diaphoresis, or radiation of pain. She had an office visit with Dr. Tamala Julian on 8/19 and is concerned about her "leaking valve." I asked if she has NTG and she states she has taken it in the past but does not have any now. States pain was not intense or she would have called 911. States she has not felt this pain before. I scheduled her for a virtual visit on 9/25 with Dr. Harrington Challenger per agreement from Dr. Harrington Challenger. Patient is aware to be ready to answer the phone tomorrow morning at around 8:20 am. The patient thanked me for my help.   YOUR CARDIOLOGY TEAM HAS ARRANGED FOR AN E-VISIT FOR YOUR APPOINTMENT - PLEASE REVIEW IMPORTANT INFORMATION BELOW SEVERAL DAYS PRIOR TO YOUR APPOINTMENT  Due to the recent COVID-19 pandemic, we are transitioning in-person office visits to tele-medicine visits in an effort to decrease unnecessary exposure to our patients, their families, and staff. These visits are billed to your insurance just like a normal visit is. We also encourage you to sign up for MyChart if you have not already done so. You will need a smartphone if possible. For patients that do not have this, we can still complete the visit using a regular telephone but do prefer a smartphone to enable video when possible. You may have a family member that lives with you that can help. If possible, we also ask that you have a blood pressure cuff and scale at home to measure your blood pressure, heart rate and weight prior to your scheduled appointment. Patients with clinical needs that need an in-person evaluation and testing will still be able to come to the office if absolutely necessary. If you have any questions, feel free to call our office.     YOUR PROVIDER WILL BE  USING THE FOLLOWING PLATFORM TO COMPLETE YOUR VISIT: Staff: telephone    2-3 [place] will receive a telephone call from one of our [place] team members - your caller ID may say "Unknown caller." If this is a video visit, we will walk you through how to get the video launched on your phone. We will remind you check your blood pressure, heart rate and weight prior to your scheduled appointment. If you have an Apple Watch or Kardia, please upload any pertinent ECG strips the day before or morning of your appointment to [place]. Our staff will also make sure you have reviewed the consent and agree to move forward with your scheduled tele-health visit.     THE DAY OF YOUR APPOINTMENT  Approximately 15 minutes prior to your scheduled appointment, you will receive a telephone call from one of [place] team - your caller ID may say "Unknown caller."  Our staff will confirm medications, vital signs for the day and any symptoms you may be experiencing. Please have this information available prior to the time of visit start. It may also be helpful for you to have a pad of paper and pen handy for any instructions given during your visit. They will also walk you through joining the smartphone meeting if this is a video visit.    CONSENT FOR TELE-HEALTH VISIT - PLEASE REVIEW  I hereby voluntarily request, consent and authorize [facility] and its employed or contracted physicians, Engineer, materials, nurse practitioners or other licensed health care professionals (the Practitioner), to provide me with telemedicine health care services (the "Services") as deemed necessary by the treating Practitioner. I acknowledge and consent to receive the Services by the Practitioner via telemedicine. I understand that the telemedicine visit will involve communicating with the Practitioner through live audiovisual communication technology and the disclosure of certain medical information by  electronic transmission. I acknowledge that I have been given the opportunity to request an in-person assessment or other available alternative prior to the telemedicine visit and am voluntarily participating in the telemedicine visit.  I understand that I have the right to withhold or withdraw my consent to the use of telemedicine in the course of my care at any time, without affecting my right to future care or treatment, and that the Practitioner or I may terminate the telemedicine visit at any time. I understand that I have the right to inspect all information obtained and/or recorded in the course of the telemedicine visit and may receive copies of available information for a reasonable fee.  I understand that some of the potential risks of receiving the Services via telemedicine include:  Marland Kitchen Delay or interruption in medical evaluation due to technological equipment failure or disruption; . Information transmitted may not be sufficient (e.g. poor resolution of images) to allow for appropriate medical decision making by the Practitioner; and/or  . In rare instances, security protocols could fail, causing a breach of personal health information.  Furthermore, I acknowledge that it is my responsibility to provide information about my medical history, conditions and care that is complete and accurate to the best of my ability. I acknowledge that Practitioner's advice, recommendations, and/or decision may be based on factors not within their control, such as incomplete or inaccurate data provided by me or distortions of diagnostic images or specimens that may result from electronic transmissions. I understand that the practice of medicine is not an exact science and that Practitioner makes no warranties or guarantees regarding treatment outcomes. I acknowledge that I will receive a copy of this consent concurrently upon execution via email to the email address I last provided but may also request a printed  copy by calling the office of [place].    I understand that my insurance will be billed for this visit.   I have read or had this consent read to me. . I understand the contents of this consent, which adequately explains the benefits and risks of the Services being provided via telemedicine.  . I have been provided ample opportunity to ask questions regarding this consent and the Services and have had my questions answered to my satisfaction. . I give my informed consent for the services to be provided through the use of telemedicine in my medical care  By participating in this telemedicine visit I agree to the above.

## 2019-08-23 NOTE — Telephone Encounter (Signed)
New Message:  Patient would like Dr. Thompson Caul nurse, or any nurse covering for her to call her. She was not specific as to why she needed to talk to the nurse, she just requested a call back

## 2019-08-24 ENCOUNTER — Other Ambulatory Visit: Payer: Self-pay

## 2019-08-24 ENCOUNTER — Telehealth: Payer: Self-pay | Admitting: Interventional Cardiology

## 2019-08-24 ENCOUNTER — Telehealth (INDEPENDENT_AMBULATORY_CARE_PROVIDER_SITE_OTHER): Payer: Medicare Other | Admitting: Internal Medicine

## 2019-08-24 ENCOUNTER — Encounter: Payer: Self-pay | Admitting: Internal Medicine

## 2019-08-24 VITALS — Ht 66.0 in | Wt 199.0 lb

## 2019-08-24 DIAGNOSIS — I351 Nonrheumatic aortic (valve) insufficiency: Secondary | ICD-10-CM

## 2019-08-24 DIAGNOSIS — I10 Essential (primary) hypertension: Secondary | ICD-10-CM | POA: Diagnosis not present

## 2019-08-24 DIAGNOSIS — R079 Chest pain, unspecified: Secondary | ICD-10-CM

## 2019-08-24 DIAGNOSIS — R0789 Other chest pain: Secondary | ICD-10-CM | POA: Diagnosis not present

## 2019-08-24 NOTE — Telephone Encounter (Signed)
Patient calling about some information about an appt with Dr. Harrington Challenger, and a prescription for some nitroglycerin.

## 2019-08-24 NOTE — Telephone Encounter (Signed)
Patient followed by Dr Tamala Julian   Will forward Televisit:  CP appeared to be musculoskeletal  Improved   I am not convinced on angina

## 2019-08-24 NOTE — Patient Instructions (Signed)
Medication Instructions:  Your physician recommends that you continue on your current medications as directed. Please refer to the Current Medication list given to you today.  If you need a refill on your cardiac medications before your next appointment, please call your pharmacy.   Lab work: none If you have labs (blood work) drawn today and your tests are completely normal, you will receive your results only by: Marland Kitchen MyChart Message (if you have MyChart) OR . A paper copy in the mail If you have any lab test that is abnormal or we need to change your treatment, we will call you to review the results.  Testing/Procedures: none  Follow-Up: Keep plans for follow up with Dr. Daneen Schick 06/2020... will receive a letter 2 months in advance to make that appt.  At [facility], you and your health needs are our priority.  As part of our continuing mission to provide you with exceptional heart care, we have created designated Provider Care Teams.  These Care Teams include your primary Cardiologist (physician) and Advanced Practice Providers (APPs -  Physician Assistants and Nurse Practitioners) who all work together to provide you with the care you need, when you need it.   Any Other Special Instructions Will Be Listed Below (If Applicable).

## 2019-08-24 NOTE — Telephone Encounter (Signed)
Pt called requesting RX for Nitro.. will talk with Dr. Harrington Challenger RE: the RX since she had virtual with her this A.M. and felt her pain was musculoskeletal.

## 2019-08-26 NOTE — Telephone Encounter (Signed)
I am okay with her use nitroglycerin to see if it helps. Doubt that it will. She does not have documented CAD.

## 2019-08-27 NOTE — Telephone Encounter (Signed)
Left message to call back  

## 2019-08-30 MED ORDER — NITROGLYCERIN 0.4 MG SL SUBL: 0.4000 mg | SUBLINGUAL_TABLET | SUBLINGUAL | 3 refills | Status: DC | PRN

## 2019-08-30 NOTE — Telephone Encounter (Signed)
Left message to call back  

## 2019-08-30 NOTE — Telephone Encounter (Signed)
Spoke with pt and made her aware of Dr. Thompson Caul recommendation.  Advised to make sure she is sitting down when she uses these as they can drop her BP.  Gave instructions on proper use.  Pt verbalized understanding and was in agreement with this plan.

## 2019-09-11 ENCOUNTER — Telehealth: Payer: Self-pay | Admitting: Interventional Cardiology

## 2019-09-11 NOTE — Telephone Encounter (Signed)
Spoke with pt and made her aware that I have not called since we last spoke.  Pt states they left a message but she couldn't catch the name because they spoke so softly.  Told pt maybe they will call back but I was unable to locate who it may have been.

## 2019-09-11 NOTE — Telephone Encounter (Signed)
New message:    Patient states that some one called her. I did not see a message:

## 2019-09-12 DIAGNOSIS — M47817 Spondylosis without myelopathy or radiculopathy, lumbosacral region: Secondary | ICD-10-CM | POA: Diagnosis not present

## 2019-09-12 DIAGNOSIS — M47816 Spondylosis without myelopathy or radiculopathy, lumbar region: Secondary | ICD-10-CM | POA: Diagnosis not present

## 2019-09-12 DIAGNOSIS — I10 Essential (primary) hypertension: Secondary | ICD-10-CM | POA: Diagnosis not present

## 2019-09-12 DIAGNOSIS — M4316 Spondylolisthesis, lumbar region: Secondary | ICD-10-CM | POA: Diagnosis not present

## 2019-09-13 ENCOUNTER — Encounter: Payer: Medicare Other | Attending: Family Medicine | Admitting: Registered"

## 2019-09-13 ENCOUNTER — Other Ambulatory Visit: Payer: Self-pay

## 2019-09-13 DIAGNOSIS — I252 Old myocardial infarction: Secondary | ICD-10-CM | POA: Diagnosis not present

## 2019-09-13 DIAGNOSIS — Z79899 Other long term (current) drug therapy: Secondary | ICD-10-CM | POA: Insufficient documentation

## 2019-09-13 DIAGNOSIS — R635 Abnormal weight gain: Secondary | ICD-10-CM | POA: Diagnosis not present

## 2019-09-13 DIAGNOSIS — I119 Hypertensive heart disease without heart failure: Secondary | ICD-10-CM | POA: Diagnosis not present

## 2019-09-13 DIAGNOSIS — Z713 Dietary counseling and surveillance: Secondary | ICD-10-CM | POA: Diagnosis not present

## 2019-09-13 DIAGNOSIS — E119 Type 2 diabetes mellitus without complications: Secondary | ICD-10-CM | POA: Insufficient documentation

## 2019-09-13 NOTE — Progress Notes (Signed)
Follow Up for Medical Nutrition Therapy:  Appt start time: 1400 end time:  1428  Assessment:  Patient is here for follow-up for weight management.  Patient is using a rollator instead of a cane today. Pt states she feels more stable and glad that she was able to have a doctor's order so she would be able to afford it.  Pt states she is picking up a new muscle relaxer medication today. Pt states she tries to exercise but back hurts, hoping medication will help. Pt states she plans to get shots in her back next month.  Pt states she bought a 2 liter bottle of ginger ale yesterday, had some yesterday and today, but states she is not a big soda drinker and her grandson will probably finish it. Pt states she has a cabinet full of juice, not drinking it much either.  Pt diet recall has fewer fruits and vegetables than last visit. Patient asked for a list of vegetables to give her ideas. As we went through the list identifing her preferences, there were only 3 that she doesn't like.   In conversation patient states she was eating graham crackers while watching TV. RD may want to talk about eating and watching TV next time.  Pt states her grandson is still living with her but looking for his own place, has an interview for another job. Pt seems to be much calmer since her grandson has been living with her. Prior to his moving in with her patient had stated interest in getting a pet or foster child so she wouldn't be alone.  Preferred Learning Style:   No preference indicated   Learning Readiness:   Change in progress  MEDICATIONS: reviewed   DIETARY INTAKE:   Avoided foods include pork and tuna fish.  24-hr recall:  B ( AM): sandwich, roast beef, [place], cheese (something quick because back was hurting) strawberry yoplait Snk ( AM):  Graham crackers L ( PM):  Oodles of noodles Snk ( PM): apple D ( PM): steak, mashed potatoes, spinach, mac & cheese Snk ( PM): ice cream (butter  pecan) Beverages: water, [place] tea, regular ginger ale   Usual physical activity: ADLs. Pt states back pain preventing walking  Estimated energy needs: 1600 calories 180 g carbohydrates 120 g protein 44 g fat  Progress Towards Goal(s):  in progress.    Nutritional Diagnosis:  [place]-3.3 Overweight/obesity As related to hx of energy dense food choices and sugary beverages.  As evidenced by BMI of 35.9    Intervention:  Nutrition counseling.   Plan: Since you like a variety of fruits and vegetables, think about eating those when for snacks an using them in each meal. Look for the pre-cut vegetables, salad, Chicken on the [employer], Uncle Ben's wild rice, apple sauce. Continue stretching and walking as tolerated.  Continue keep water with you when you are out on errands.  Teaching Method Utilized:  Visual Auditory  Handouts given during visit include: MyPlate Planner and Meal planner worksheet.  Barriers to learning/adherence to lifestyle change: limited finances and reduced mobility due to effects of stroke  Demonstrated degree of understanding via:  Teach Back   Monitoring/Evaluation:  Dietary intake, exercise, and body weight 3 months.

## 2019-09-13 NOTE — Patient Instructions (Addendum)
Since you like a variety of fruits and vegetables, think about eating those when for snacks an using them in each meal. Look for the pre-cut vegetables, salad, Chicken on the [employer], Uncle [employer] wild rice, apple sauce. Continue stretching and walking as tolerated.  Continue keep water with you when you are out on errands.

## 2019-10-03 DIAGNOSIS — M47816 Spondylosis without myelopathy or radiculopathy, lumbar region: Secondary | ICD-10-CM | POA: Diagnosis not present

## 2019-10-04 DIAGNOSIS — Z1159 Encounter for screening for other viral diseases: Secondary | ICD-10-CM | POA: Diagnosis not present

## 2019-10-08 NOTE — Telephone Encounter (Signed)
This encounter was created in error - please disregard.

## 2019-10-09 DIAGNOSIS — K640 First degree hemorrhoids: Secondary | ICD-10-CM | POA: Diagnosis not present

## 2019-10-09 DIAGNOSIS — K635 Polyp of colon: Secondary | ICD-10-CM | POA: Diagnosis not present

## 2019-10-09 DIAGNOSIS — K5730 Diverticulosis of large intestine without perforation or abscess without bleeding: Secondary | ICD-10-CM | POA: Diagnosis not present

## 2019-10-09 DIAGNOSIS — Z1211 Encounter for screening for malignant neoplasm of colon: Secondary | ICD-10-CM | POA: Diagnosis not present

## 2019-10-10 ENCOUNTER — Other Ambulatory Visit: Payer: Self-pay | Admitting: *Deleted

## 2019-10-10 DIAGNOSIS — I719 Aortic aneurysm of unspecified site, without rupture: Secondary | ICD-10-CM

## 2019-10-12 DIAGNOSIS — K635 Polyp of colon: Secondary | ICD-10-CM | POA: Diagnosis not present

## 2019-10-16 ENCOUNTER — Other Ambulatory Visit: Payer: Self-pay

## 2019-10-16 ENCOUNTER — Encounter: Payer: Self-pay | Admitting: Adult Health

## 2019-10-16 ENCOUNTER — Ambulatory Visit (INDEPENDENT_AMBULATORY_CARE_PROVIDER_SITE_OTHER): Payer: Medicare Other | Admitting: Adult Health

## 2019-10-16 VITALS — BP 192/97 | Temp 97.4°F | Ht 66.0 in | Wt 202.4 lb

## 2019-10-16 DIAGNOSIS — I619 Nontraumatic intracerebral hemorrhage, unspecified: Secondary | ICD-10-CM

## 2019-10-16 DIAGNOSIS — G890 Central pain syndrome: Secondary | ICD-10-CM | POA: Diagnosis not present

## 2019-10-16 NOTE — Progress Notes (Addendum)
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  She returns today for follow-up.  Overall she reports that she has been doing fairly well.  She states on occasion she may feel really hot on the left side or really cold.  She states that gabapentin has been controlling her symptoms.  She continues to take 800 mg twice a day.  She continues to use a Rollator when ambulating.  She reports a tremor in the left upper extremity after her stroke.  She returns today for an evaluation.  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  She returns today for follow-up.  She remains on gabapentin taking 800 mg twice a day.  The pain continues to like the left side.  She states that gabapentin is beneficial.  She wears an AFO brace on the left foot.  She uses a cane when ambulating.  She reports that she has trouble ambulating long distances.  She plans to see Dr. Maryjean Ka for epidural steroid injection.  She states that Sunday she went to get something out of the refrigerator and fell backwards.  Fortunately she did not suffer any significant injuries.  She reports that she is just a little sore.  She returns today for evaluation.  REVIEW OF SYSTEMS: Out of a complete 14 system review of symptoms, the patient complains only of the following symptoms, and all other reviewed systems are negative.  See HPI  ALLERGIES: Allergies  Allergen Reactions  . Other Swelling    Hair dye, caused eye swelling, multiple times experienced this reaction  . Hydrocodone-Acetaminophen Nausea Only    HOME MEDICATIONS: Outpatient Medications Prior to Visit  Medication Sig Dispense Refill  . amLODipine-valsartan (EXFORGE) 10-320 MG tablet Take 1 tablet by  mouth daily.    Marland Kitchen aspirin EC 81 MG tablet Take 1 tablet (81 mg total) by mouth daily.    Marland Kitchen atorvastatin (LIPITOR) 10 MG tablet TAKE 1 TABLET(10 MG) BY MOUTH DAILY. 90 tablet 3  . citalopram (CELEXA) 10 MG tablet Take 20 mg by mouth daily.     . cloNIDine (CATAPRES) 0.3 MG tablet TAKE 1 TABLET BY MOUTH TWICE DAILY 180 tablet 1  . cyanocobalamin 100 MCG tablet Take 100 mcg by mouth daily.     . cyclobenzaprine (FLEXERIL) 10 MG tablet Take 10 mg by mouth 3 (three) times daily as needed for muscle spasms.    Marland Kitchen doxazosin (CARDURA) 8 MG tablet Take 8 mg by mouth at bedtime.    Marland Kitchen ezetimibe (ZETIA) 10 MG tablet Take 10 mg by mouth daily.    . furosemide (LASIX) 20 MG tablet Take 20 mg by mouth 2 (two) times daily.     Marland Kitchen gabapentin (NEURONTIN) 800 MG tablet Take 1 tablet (800 mg total) by mouth 2 (two) times daily. 180 tablet 3  . indapamide (LOZOL) 2.5 MG tablet Take 2.5 mg by mouth daily.    . mirabegron ER (MYRBETRIQ) 25 MG TB24 tablet Take 25 mg by mouth daily.    . Multiple Vitamin (MULTIVITAMIN) tablet Take 1 tablet by mouth daily.      . nitroGLYCERIN (NITROSTAT) 0.4 MG SL tablet Place 1 tablet (0.4 mg total) under the tongue every 5 (five) minutes as needed for chest pain. 25  tablet 3  . pantoprazole (PROTONIX) 40 MG tablet Take 40 mg by mouth 2 (two) times daily.     . potassium chloride SA (K-DUR) 20 MEQ tablet Take 20 mEq by mouth daily.     No facility-administered medications prior to visit.     PAST MEDICAL HISTORY: Past Medical History:  Diagnosis Date  . Anxiety   . Cerebrovascular accident [facility])    a. 2007-->residual left sided weakness  . Chest pain   . Depression   . GERD (gastroesophageal reflux disease)   . Headache   . Hx of hysterectomy   . Hyperlipidemia   . Hypertension   . Mild to Moderate Aortic Insufficiency    a. 08/2014 Echo: EF 55-60%, mild conc LVH, no rwma, Gr 1 DD, mild-mod AI, mild MR.  . Morbid obesity ([place])   . Obesity   . Peptic ulcer disease      PAST SURGICAL HISTORY: Past Surgical History:  Procedure Laterality Date  . ABDOMINAL HYSTERECTOMY    . 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 TUBAL LIGATION      FAMILY HISTORY: Family History  Problem Relation Age of Onset  . Hypertension Mother        deceased  . Peripheral vascular disease Mother   . Cirrhosis Father        DeCEASED  . Heart attack Other        grandmother deceased  . Cancer Brother   . Drug abuse Brother        overdose, in 1 brother  . Breast cancer Neg Hx     SOCIAL HISTORY: Social History   Socioeconomic History  . Marital status: Married    Spouse name: Not on file  . Number of children: 3  . Years of education: 32  . Highest education level: Not on file  Occupational History  . 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 Financial resource strain: Not on file  . Food insecurity    Worry: Not on file    Inability: Not on file  . Transportation needs    Medical: Not on file    Non-medical: Not on file  Tobacco Use  . Smoking status: Current Every Day Smoker  . Smokeless tobacco: Never Used  Substance and Sexual Activity  . Alcohol use: No    Alcohol/week: 0.0 standard drinks  . Drug use: No  . Sexual activity: Not Currently  Lifestyle  . Physical activity    Days per week: Not on file    Minutes per session: Not on file  . Stress: Not on file  Relationships  . Social Herbalist on phone: Not on file    Gets together: Not on file    Attends religious service: Not on file    Active member of club or organization: Not on file    Attends meetings of clubs or organizations: Not on file    Relationship status: Not on file  . Intimate partner violence    Fear of current or ex partner: Not on file    Emotionally abused: Not on file     Physically abused: Not on file    Forced sexual activity: Not on file  Other Topics Concern  . Not on file  Social History Narrative  Patient consumes no caffeine      PHYSICAL EXAM  Vitals:   10/16/19 1141 10/16/19 1145  BP: (!) 180/92 (!) 192/97  Temp: (!) 97.4 F (36.3 C)   Weight: 202 lb 6.4 oz (91.8 kg)   Height: 5\' 6"  (1.676 m)    Body mass index is 32.67 kg/m.  Generalized: Well developed, in no acute distress   Neurological examination  Mentation: Alert oriented to time, place, history taking. Follows all commands speech and language fluent Cranial nerve II-XII: Pupils were equal round reactive to light. Extraocular movements were full, visual field were full on confrontational test. . Head turning and shoulder shrug  were normal and symmetric. Motor: 5/5 in the right upper extremity and right lower extremity. 4/5 in the left upper extremity and lower extremity.   Good symmetric motor tone is noted throughout.  Sensory: Sensory testing is intact to soft touch on all 4 extremities. No evidence of extinction is noted.   Coordination: Cerebellar testing reveals good finger-nose-finger and heel-to-shin bilaterally.  Tremor noted in the left upper extremity was finger-nose-finger Gait and station: Patient uses a Rollator when ambulating.   limp on the left.   DIAGNOSTIC DATA (LABS, IMAGING, TESTING) - I reviewed patient records, labs, notes, testing and imaging myself where available.  Lab Results  Component Value Date   WBC 4.0 08/09/2016   HGB 14.6 08/09/2016   HCT 43.0 08/09/2016   MCV 89.8 08/09/2016   PLT 237 08/09/2016      Component Value Date/Time   NA 145 08/09/2016 1603   K 3.5 08/09/2016 1603   CL 103 08/09/2016 1603   CO2 28 04/20/2016 1409   GLUCOSE 89 08/09/2016 1603   BUN 14 08/09/2016 1603   CREATININE 1.20 (H) 08/09/2016 1603   CREATININE 1.11 (H) 04/20/2016 1409   CALCIUM 9.4 04/20/2016 1409   PROT 7.2 06/13/2013 1045   ALBUMIN 3.6  06/13/2013 1045   AST 19 06/13/2013 1045   ALT 17 06/13/2013 1045   ALKPHOS 93 06/13/2013 1045   BILITOT 0.3 06/13/2013 1045   GFRNONAA 42 (L) 01/06/2016 0312   GFRAA 49 (L) 01/06/2016 0312   Lab Results  Component Value Date   CHOL 134 04/20/2016   HDL 54 04/20/2016   LDLCALC 60 04/20/2016   TRIG 99 04/20/2016   CHOLHDL 2.5 04/20/2016       ASSESSMENT AND PLAN 68 y.o. year old female  has a past medical history of Anxiety, Cerebrovascular accident ([place]), Chest pain, Depression, GERD (gastroesophageal reflux disease), Headache, hysterectomy, Hyperlipidemia, Hypertension, Mild to Moderate Aortic Insufficiency, Morbid obesity ([place]), Obesity, and Peptic ulcer disease. here with:  1.  Thalamic pain syndrome 2.  History of hemorrhagic stroke  Overall the patient has remained stable.  She has been on a stable dose of gabapentin for quite some time.  She will remain on 800 mg twice a day.  I have advised the patient that if her PCP is amendable to managing gabapentin she can follow-up with our office on an as-needed basis.  The patient states that she has an appointment next month and will discuss with her PCP.  She will follow-up on an as-needed basis or yearly if her PCP does not take over gabapentin.      Ward Givens, MSN, NP-C 10/16/2019, 12:03 PM [facility] [address], [place], [place] [address] [phone]  I reviewed the above note and documentation by the Nurse Practitioner and agree with the history, exam, assessment and  plan as outlined above. I was available for consultation. Star Age, MD, PhD [facility])

## 2019-10-16 NOTE — Patient Instructions (Signed)
Your Plan:  Continue Gabapentin 800 mg twice a day Can follow-up as needed providing that PCP is ok with managing gabapentin If your symptoms worsen or you develop new symptoms please let us know.    Thank you for coming to see [place] at [facility]. I hope we have been able to provide you high quality care today.  You may receive a patient satisfaction survey over the next few weeks. We would appreciate your feedback and comments so that we may continue to improve ourselves and the health of our patients.

## 2019-10-30 ENCOUNTER — Other Ambulatory Visit: Payer: Self-pay | Admitting: Interventional Cardiology

## 2019-10-30 DIAGNOSIS — G890 Central pain syndrome: Secondary | ICD-10-CM | POA: Diagnosis not present

## 2019-10-30 DIAGNOSIS — R7303 Prediabetes: Secondary | ICD-10-CM | POA: Diagnosis not present

## 2019-10-30 DIAGNOSIS — E782 Mixed hyperlipidemia: Secondary | ICD-10-CM | POA: Diagnosis not present

## 2019-10-30 DIAGNOSIS — I10 Essential (primary) hypertension: Secondary | ICD-10-CM | POA: Diagnosis not present

## 2019-10-30 MED ORDER — CLONIDINE HCL 0.3 MG PO TABS: 0.3000 mg | ORAL_TABLET | Freq: Two times a day (BID) | ORAL | 2 refills | Status: DC

## 2019-11-01 ENCOUNTER — Other Ambulatory Visit: Payer: Self-pay | Admitting: Family Medicine

## 2019-11-01 DIAGNOSIS — Z1231 Encounter for screening mammogram for malignant neoplasm of breast: Secondary | ICD-10-CM

## 2019-11-08 DIAGNOSIS — M47816 Spondylosis without myelopathy or radiculopathy, lumbar region: Secondary | ICD-10-CM | POA: Diagnosis not present

## 2019-12-11 DIAGNOSIS — M47816 Spondylosis without myelopathy or radiculopathy, lumbar region: Secondary | ICD-10-CM | POA: Diagnosis not present

## 2019-12-11 DIAGNOSIS — I10 Essential (primary) hypertension: Secondary | ICD-10-CM | POA: Diagnosis not present

## 2019-12-12 ENCOUNTER — Encounter: Payer: Medicare Other | Attending: Family Medicine | Admitting: Registered"

## 2019-12-12 ENCOUNTER — Other Ambulatory Visit: Payer: Self-pay

## 2019-12-12 ENCOUNTER — Encounter: Payer: Self-pay | Admitting: Registered"

## 2019-12-12 DIAGNOSIS — I119 Hypertensive heart disease without heart failure: Secondary | ICD-10-CM | POA: Diagnosis not present

## 2019-12-12 DIAGNOSIS — Z713 Dietary counseling and surveillance: Secondary | ICD-10-CM | POA: Diagnosis not present

## 2019-12-12 DIAGNOSIS — I252 Old myocardial infarction: Secondary | ICD-10-CM | POA: Insufficient documentation

## 2019-12-12 DIAGNOSIS — E119 Type 2 diabetes mellitus without complications: Secondary | ICD-10-CM | POA: Diagnosis not present

## 2019-12-12 DIAGNOSIS — Z79899 Other long term (current) drug therapy: Secondary | ICD-10-CM | POA: Insufficient documentation

## 2019-12-12 DIAGNOSIS — R635 Abnormal weight gain: Secondary | ICD-10-CM | POA: Diagnosis not present

## 2019-12-12 NOTE — Patient Instructions (Addendum)
Stir fry with vegetables is a great idea Consider watching inspiring shows when watching TV. TLC show [place] or [place] Consider different snacks when watching TV, maybe baby carrots. Take advantage of warm weather and get outside walk as much as possible. Remember to take water with you when going on errands.

## 2019-12-12 NOTE — Progress Notes (Signed)
Follow Up for Medical Nutrition Therapy:  Appt start time: [id number] end time:  1435  Assessment:  Patient is here for follow-up for weight management.  Pt states she has been eating more vegetables. None yesterday but a few fruits.    Pt states she still watches My six hundred pound life. Pt reports eating corn chips watching the protests on the capitol last [place]. Pt states she wants to try eating before she watches TV.  Pt states she doesn't go outside when it is too cold, but continues to do her stretches and exercises in her bed.  Preferred Learning Style:   No preference indicated   Learning Readiness:   Change in progress  MEDICATIONS: reviewed   DIETARY INTAKE:   Avoided foods include pork and tuna fish.  24-hr recall:  B ( AM): oatmeal, boiled egg, tangerine Snk ( AM):  M&Ms  L ( PM):  [place] Sandwich, cheese Snk ( PM):  D ( PM): Oodles of noodles, hotdogs Snk ( PM): M&Ms  Beverages: fruit flavored water, [place] green tea, regular ginger ale when nauseated  Usual physical activity: ADLs.  Estimated energy needs: 1600 calories 180 g carbohydrates 120 g protein 44 g fat  Progress Towards Goal(s):  in progress.    Nutritional Diagnosis:  [place]-3.3 Overweight/obesity As related to hx of energy dense food choices and sugary beverages.  As evidenced by BMI of 35.9    Intervention:  Nutrition counseling. Discussed reasons why people might eat while watching TV. Discussed why watching inspirational shows may be more beneficial than watching shows about things we don't want to happen.  Plan: Stir fry with vegetables is a great idea Consider watching inspiring shows when watching TV. TLC show Long Lost Family or Little People, Big [place] Consider different snacks when watching TV, maybe baby carrots. Take advantage of warm weather and get outside walk as much as possible. Remember to take water with you when going on errands.  Teaching Method Utilized:   Visual Auditory  Handouts given during visit include: none  Barriers to learning/adherence to lifestyle change: limited finances and reduced mobility due to effects of stroke  Demonstrated degree of understanding via:  Teach Back   Monitoring/Evaluation:  Dietary intake, exercise, and body weight 3 months.

## 2019-12-21 ENCOUNTER — Other Ambulatory Visit: Payer: Self-pay

## 2019-12-21 ENCOUNTER — Ambulatory Visit
Admission: RE | Admit: 2019-12-21 | Discharge: 2019-12-21 | Disposition: A | Discharge status: Home / Self Care | Payer: Medicare Other | Source: Ambulatory Visit | Attending: Family Medicine | Admitting: Family Medicine

## 2019-12-21 DIAGNOSIS — Z1231 Encounter for screening mammogram for malignant neoplasm of breast: Secondary | ICD-10-CM | POA: Diagnosis not present

## 2020-01-14 ENCOUNTER — Other Ambulatory Visit: Payer: Self-pay | Admitting: *Deleted

## 2020-01-14 DIAGNOSIS — I719 Aortic aneurysm of unspecified site, without rupture: Secondary | ICD-10-CM

## 2020-01-14 DIAGNOSIS — I351 Nonrheumatic aortic (valve) insufficiency: Secondary | ICD-10-CM

## 2020-01-15 ENCOUNTER — Other Ambulatory Visit: Payer: Medicare Other

## 2020-01-15 ENCOUNTER — Other Ambulatory Visit: Payer: Self-pay

## 2020-01-15 ENCOUNTER — Encounter (INDEPENDENT_AMBULATORY_CARE_PROVIDER_SITE_OTHER): Payer: Self-pay

## 2020-01-15 DIAGNOSIS — I351 Nonrheumatic aortic (valve) insufficiency: Secondary | ICD-10-CM | POA: Diagnosis not present

## 2020-01-15 DIAGNOSIS — I10 Essential (primary) hypertension: Secondary | ICD-10-CM | POA: Diagnosis not present

## 2020-01-15 LAB — BASIC METABOLIC PANEL
BUN/Creatinine Ratio: 16 (ref 12–28)
BUN: 17 mg/dL (ref 8–27)
CO2: 28 mmol/L (ref 20–29)
Calcium: 9.5 mg/dL (ref 8.7–10.3)
Chloride: 101 mmol/L (ref 96–106)
Creatinine, Ser: 1.09 mg/dL — ABNORMAL HIGH (ref 0.57–1.00)
GFR calc Af Amer: 60 mL/min/{1.73_m2} (ref >59)
GFR calc non Af Amer: 52 mL/min/{1.73_m2} — ABNORMAL LOW (ref >59)
Glucose: 88 mg/dL (ref 65–99)
Potassium: 3.1 mmol/L — ABNORMAL LOW (ref 3.5–5.2)
Sodium: 145 mmol/L — ABNORMAL HIGH (ref 134–144)

## 2020-01-16 ENCOUNTER — Other Ambulatory Visit: Payer: Self-pay | Admitting: *Deleted

## 2020-01-16 DIAGNOSIS — E876 Hypokalemia: Secondary | ICD-10-CM

## 2020-01-18 ENCOUNTER — Telehealth: Payer: Self-pay | Admitting: Interventional Cardiology

## 2020-01-18 NOTE — Telephone Encounter (Signed)
  Patient states she is returning a call regarding results and she needed to get some information about a test that Dr Tamala Julian was going to order.

## 2020-01-18 NOTE — Telephone Encounter (Signed)
Spoke with pt and she inquired about CT and echo. Gave date and time for echo.  Advised CT is due anytime now and will have to be scheduled at [place].  Advised I will send message to [facility] team to assist with getting this scheduled.  Pt appreciative for call.

## 2020-01-22 ENCOUNTER — Other Ambulatory Visit: Payer: Medicare Other

## 2020-01-22 ENCOUNTER — Inpatient Hospital Stay: Admission: RE | Admit: 2020-01-22 | Payer: Medicare Other | Source: Ambulatory Visit

## 2020-01-22 ENCOUNTER — Other Ambulatory Visit: Payer: Self-pay

## 2020-01-22 DIAGNOSIS — E876 Hypokalemia: Secondary | ICD-10-CM

## 2020-01-23 LAB — BASIC METABOLIC PANEL
BUN/Creatinine Ratio: 19 (ref 12–28)
BUN: 24 mg/dL (ref 8–27)
CO2: 25 mmol/L (ref 20–29)
Calcium: 9.5 mg/dL (ref 8.7–10.3)
Chloride: 103 mmol/L (ref 96–106)
Creatinine, Ser: 1.27 mg/dL — ABNORMAL HIGH (ref 0.57–1.00)
GFR calc Af Amer: 50 mL/min/{1.73_m2} — ABNORMAL LOW (ref >59)
GFR calc non Af Amer: 43 mL/min/{1.73_m2} — ABNORMAL LOW (ref >59)
Glucose: 85 mg/dL (ref 65–99)
Potassium: 4.2 mmol/L (ref 3.5–5.2)
Sodium: 144 mmol/L (ref 134–144)

## 2020-01-27 ENCOUNTER — Ambulatory Visit: Payer: Medicare Other | Attending: Internal Medicine

## 2020-01-27 DIAGNOSIS — I69952 Hemiplegia and hemiparesis following unspecified cerebrovascular disease affecting left dominant side: Secondary | ICD-10-CM | POA: Diagnosis not present

## 2020-01-27 DIAGNOSIS — E785 Hyperlipidemia, unspecified: Secondary | ICD-10-CM | POA: Diagnosis not present

## 2020-01-27 DIAGNOSIS — Z23 Encounter for immunization: Secondary | ICD-10-CM | POA: Insufficient documentation

## 2020-01-27 DIAGNOSIS — I119 Hypertensive heart disease without heart failure: Secondary | ICD-10-CM | POA: Diagnosis not present

## 2020-01-27 NOTE — Progress Notes (Signed)
   Covid-19 Vaccination Clinic  Name:  Chelsea [name]    MRN: [id number] DOB: 09-07-1951  01/27/2020  Ms. Data was observed post Covid-19 immunization for 15 minutes without incidence. She was provided with Vaccine Information Sheet and instruction to access the V-Safe system.   [name] was instructed to call 911 with any severe reactions post vaccine: Marland Kitchen Difficulty breathing  . Swelling of your face and throat  . A fast heartbeat  . A bad rash all over your body  . Dizziness and weakness    Immunizations Administered    Name Date Dose VIS Date Route   Pfizer COVID-19 Vaccine 01/27/2020  9:07 AM 0.3 mL 11/09/2019 Intramuscular   Manufacturer: [place]   Lot: [id number]   [place]: [id number]

## 2020-01-28 DIAGNOSIS — I10 Essential (primary) hypertension: Secondary | ICD-10-CM | POA: Diagnosis not present

## 2020-01-28 DIAGNOSIS — E1100 Type 2 diabetes mellitus with hyperosmolarity without nonketotic hyperglycemic-hyperosmolar coma (NKHHC): Secondary | ICD-10-CM | POA: Diagnosis not present

## 2020-01-30 ENCOUNTER — Other Ambulatory Visit: Payer: Medicare Other

## 2020-02-04 ENCOUNTER — Ambulatory Visit
Admission: RE | Admit: 2020-02-04 | Discharge: 2020-02-04 | Disposition: A | Discharge status: Home / Self Care | Payer: Medicare Other | Source: Ambulatory Visit | Attending: Interventional Cardiology | Admitting: Interventional Cardiology

## 2020-02-04 ENCOUNTER — Other Ambulatory Visit: Payer: Self-pay

## 2020-02-04 DIAGNOSIS — I77819 Aortic ectasia, unspecified site: Secondary | ICD-10-CM | POA: Diagnosis not present

## 2020-02-04 DIAGNOSIS — I719 Aortic aneurysm of unspecified site, without rupture: Secondary | ICD-10-CM

## 2020-02-04 DIAGNOSIS — I712 Thoracic aortic aneurysm, without rupture: Secondary | ICD-10-CM | POA: Diagnosis not present

## 2020-02-04 MED ORDER — IOPAMIDOL (ISOVUE-370) INJECTION 76%
75.0000 mL | Freq: Once | INTRAVENOUS | Status: AC | PRN
  Administered 2020-02-04: 75 mL via INTRAVENOUS

## 2020-02-05 ENCOUNTER — Telehealth: Payer: Self-pay | Admitting: Interventional Cardiology

## 2020-02-05 NOTE — Telephone Encounter (Signed)
Patient states that she is returning Jennifer's call.

## 2020-02-05 NOTE — Telephone Encounter (Signed)
Spoke with pt and reviewed CT results and recommendations per Dr. Tamala Julian.  Pt verbalized understanding and was in agreement with plan.

## 2020-02-05 NOTE — Telephone Encounter (Signed)
New message:    Patient calling asking if someone can call concerning a test. Please call patient.

## 2020-02-05 NOTE — Telephone Encounter (Signed)
Spoke with pt and she had further questions about thyroid nodule.  Advised pt to reach out to PCP for guidance on next steps for keeping on eye on this.  Pt verbalized understanding.

## 2020-02-06 DIAGNOSIS — I10 Essential (primary) hypertension: Secondary | ICD-10-CM | POA: Diagnosis not present

## 2020-02-06 DIAGNOSIS — M545 Low back pain: Secondary | ICD-10-CM | POA: Diagnosis not present

## 2020-02-07 ENCOUNTER — Telehealth: Payer: Self-pay | Admitting: Interventional Cardiology

## 2020-02-07 NOTE — Telephone Encounter (Signed)
New Message    Patient is calling because she was told that Anderson Malta would fax over the copies of her CT results to her gastroenterologist. Please call to discuss.

## 2020-02-07 NOTE — Telephone Encounter (Signed)
Pt informed that Anderson Malta, RN did send CT result to both Dr. Michail Sermon & Dr. Criss Rosales as requested, on 3/9.  She states they both said they didn't get. Informed that I would resend now. Pt appreciates the help

## 2020-02-08 ENCOUNTER — Telehealth: Payer: Self-pay

## 2020-02-08 NOTE — Telephone Encounter (Signed)
Pt called regarding copies of her CT scan with gallstones. She states she sent copies to Dr. Michail Sermon, but wasn't aware she should have sent them to [place]. Pt needs clarity. Please call at [phone].  Thanks

## 2020-02-08 NOTE — Telephone Encounter (Signed)
Spoke with pt and she states that someone from Dr. Kathline Magic office called and said that the records should have been sent to [facility] because they don't do surgeries at Dr. Kathline Magic. Pt states that she told them that she wanted to talk to Dr. Kathline Magic nurse.  Advised this was correct because Dr. Michail Sermon needs to look at the records and see if she even needs surgery.  Pt appreciative for call.

## 2020-02-11 ENCOUNTER — Telehealth: Payer: Self-pay | Admitting: Interventional Cardiology

## 2020-02-11 NOTE — Telephone Encounter (Signed)
Patient states she is returning a call from [place] last week. She says she is unsure of what the call was about.

## 2020-02-11 NOTE — Telephone Encounter (Signed)
Spoke with pt and made her aware that the message was likely old because I did not call and leave any messages last week.  Pt appreciative for call.

## 2020-02-15 DIAGNOSIS — H814 Vertigo of central origin: Secondary | ICD-10-CM | POA: Diagnosis not present

## 2020-02-26 ENCOUNTER — Ambulatory Visit: Payer: Medicare Other | Attending: Internal Medicine

## 2020-02-26 DIAGNOSIS — Z23 Encounter for immunization: Secondary | ICD-10-CM

## 2020-02-26 NOTE — Progress Notes (Signed)
   Covid-19 Vaccination Clinic  Name:  Chelsea [name]    MRN: [id number] DOB: [birth year]  02/26/2020  Chelsea [name] was observed post Covid-19 immunization for 15 minutes without incident. She was provided with Vaccine Information Sheet and instruction to access the V-Safe system.   Chelsea [name] was instructed to call 911 with any severe reactions post vaccine: Marland Kitchen Difficulty breathing  . Swelling of face and throat  . A fast heartbeat  . A bad rash all over body  . Dizziness and weakness   Immunizations Administered    Name Date Dose VIS Date Route   Pfizer COVID-19 Vaccine 02/26/2020  9:11 AM 0.3 mL 11/09/2019 Intramuscular   Manufacturer: [place]   Lot: [id number]   [place]: [id number]

## 2020-03-04 ENCOUNTER — Other Ambulatory Visit: Payer: Self-pay

## 2020-03-04 ENCOUNTER — Encounter: Payer: Self-pay | Admitting: Registered"

## 2020-03-04 ENCOUNTER — Encounter: Payer: Medicare Other | Attending: Family Medicine | Admitting: Registered"

## 2020-03-04 DIAGNOSIS — E119 Type 2 diabetes mellitus without complications: Secondary | ICD-10-CM | POA: Diagnosis not present

## 2020-03-04 DIAGNOSIS — I119 Hypertensive heart disease without heart failure: Secondary | ICD-10-CM | POA: Insufficient documentation

## 2020-03-04 DIAGNOSIS — I252 Old myocardial infarction: Secondary | ICD-10-CM | POA: Insufficient documentation

## 2020-03-04 DIAGNOSIS — Z713 Dietary counseling and surveillance: Secondary | ICD-10-CM | POA: Insufficient documentation

## 2020-03-04 DIAGNOSIS — R635 Abnormal weight gain: Secondary | ICD-10-CM | POA: Insufficient documentation

## 2020-03-04 DIAGNOSIS — Z79899 Other long term (current) drug therapy: Secondary | ICD-10-CM | POA: Insufficient documentation

## 2020-03-04 NOTE — Progress Notes (Signed)
Follow Up for Medical Nutrition Therapy:  Appt start time: 1400 end time:  1430  Assessment:  Patient is here for follow-up for weight management.  Pt states started stir fry vegetables and chicken with rice. Daughter cooked Easter dinner.     Pt states vertigo started after getting COVID vaccination and states doesn't want to eat heavy meals so has been eating more soups. Pt states she hasn't been walking much d/t vertigo and fear of falling. Pt states now that symptoms are resolving plans to start walking more. 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   Pt states her grandson is still living with patient and watches out for her. Patient states she still wants to move, but doesn't have resources.  Patient plans to try cooking cornish hen stuffed with ground beef, and stuffed green peppers.   Preferred Learning Style:   No preference indicated   Learning Readiness:   Change in progress  MEDICATIONS: reviewed   DIETARY INTAKE:   Avoided foods include pork and tuna fish, mushrooms  24-hr recall:  B ( AM): oatmeal, toast or jello cups Snk ( AM):  M&Ms  L ( PM):  [employer] dinners chicken, gravy, potatoes Snk ( PM): green beans D ( PM): Vegetable soup Snk ( PM): strawberry, blueberries on short cake OR m&ms  Beverages: fruit flavored water, green tea, regular ginger ale when nauseated  Usual physical activity: ADLs.  Estimated energy needs: 1600 calories 180 g carbohydrates 120 g protein 44 g fat  Progress Towards Goal(s):  in progress.    Nutritional Diagnosis:  [place]-3.3 Overweight/obesity As related to hx of energy dense food choices and sugary beverages.  As evidenced by BMI of 35.9    Intervention:  Nutrition counseling. Importance of staying hydrated so dehydration won't contribute to dizziness. Rinse canned beans to reduce sodium.  Plan: Continue to be careful with movement due to vertigo Continue with your plan to get more active when  dizziness subsides. Continue eating vegetables.  Next visit will get body weight  Teaching Method Utilized:  Visual Auditory  Handouts given during visit include: none  Barriers to learning/adherence to lifestyle change: limited finances and reduced mobility due to effects of stroke  Demonstrated degree of understanding via:  Teach Back   Monitoring/Evaluation:  Dietary intake, exercise, and body weight 2 months.

## 2020-03-04 NOTE — Patient Instructions (Addendum)
Continue to be careful with movement due to vertigo Continue to drink plenty of water so you do not get dehydrated. Continue with your plan to get more active when dizziness subsides. Continue eating vegetables.

## 2020-03-05 DIAGNOSIS — R42 Dizziness and giddiness: Secondary | ICD-10-CM | POA: Diagnosis not present

## 2020-03-06 DIAGNOSIS — H8111 Benign paroxysmal vertigo, right ear: Secondary | ICD-10-CM | POA: Diagnosis not present

## 2020-03-10 DIAGNOSIS — E049 Nontoxic goiter, unspecified: Secondary | ICD-10-CM | POA: Diagnosis not present

## 2020-03-10 DIAGNOSIS — I1310 Hypertensive heart and chronic kidney disease without heart failure, with stage 1 through stage 4 chronic kidney disease, or unspecified chronic kidney disease: Secondary | ICD-10-CM | POA: Diagnosis not present

## 2020-03-10 DIAGNOSIS — E782 Mixed hyperlipidemia: Secondary | ICD-10-CM | POA: Diagnosis not present

## 2020-03-11 ENCOUNTER — Other Ambulatory Visit: Payer: Self-pay

## 2020-03-11 ENCOUNTER — Other Ambulatory Visit: Payer: Self-pay | Admitting: Adult Health

## 2020-03-11 MED ORDER — ATORVASTATIN CALCIUM 10 MG PO TABS: ORAL_TABLET | ORAL | 1 refills | Status: DC

## 2020-03-13 ENCOUNTER — Other Ambulatory Visit (HOSPITAL_COMMUNITY): Payer: Self-pay | Admitting: Family Medicine

## 2020-03-13 ENCOUNTER — Other Ambulatory Visit: Payer: Self-pay | Admitting: Family Medicine

## 2020-03-13 DIAGNOSIS — E041 Nontoxic single thyroid nodule: Secondary | ICD-10-CM

## 2020-03-20 ENCOUNTER — Other Ambulatory Visit: Payer: Self-pay

## 2020-03-20 ENCOUNTER — Ambulatory Visit (HOSPITAL_COMMUNITY)
Admission: RE | Admit: 2020-03-20 | Discharge: 2020-03-20 | Disposition: A | Discharge status: Home / Self Care | Payer: Medicare Other | Source: Ambulatory Visit | Attending: Family Medicine | Admitting: Family Medicine

## 2020-03-20 DIAGNOSIS — E041 Nontoxic single thyroid nodule: Secondary | ICD-10-CM | POA: Insufficient documentation

## 2020-03-20 DIAGNOSIS — E042 Nontoxic multinodular goiter: Secondary | ICD-10-CM | POA: Diagnosis not present

## 2020-03-27 ENCOUNTER — Encounter: Payer: Self-pay | Admitting: Physical Therapy

## 2020-03-27 ENCOUNTER — Other Ambulatory Visit: Payer: Self-pay

## 2020-03-27 ENCOUNTER — Ambulatory Visit: Payer: Medicare Other | Attending: Otolaryngology | Admitting: Physical Therapy

## 2020-03-27 DIAGNOSIS — H8111 Benign paroxysmal vertigo, right ear: Secondary | ICD-10-CM | POA: Diagnosis not present

## 2020-03-27 DIAGNOSIS — R42 Dizziness and giddiness: Secondary | ICD-10-CM

## 2020-03-27 NOTE — Patient Instructions (Signed)
Self Treatment for Right Posterior / Anterior Canalithiasis    Sitting on bed: 1. Turn head 45 right. (a) Lie back slowly, shoulders on pillow, head on bed. (b) Hold _30___ seconds. 2. Keeping head on bed, turn head 90 left. Hold _30___ seconds. 3. Roll to left, head on 45 angle down toward bed. Hold _30___ seconds. 4. Sit up on left side of bed. Repeat ___3_ times per session. Do _2___ sessions per day.  Copyright  VHI. All rights reserved.

## 2020-03-27 NOTE — Therapy (Signed)
[facility] [address] [place], [place], [address] Phone: [phone]   Fax:  [phone]  Physical Therapy Evaluation  Patient Details  Name: Chelsea [name] MRN: [id number] Date of Birth: January 21, 1951 Referring Provider (PT): Dr. Lind Guest   Encounter Date: 03/27/2020  PT End of Session - 03/27/20 2131    Visit Number  1    Number of Visits  4    Date for PT Re-Evaluation  04/25/20    Authorization Type  [facility] Medicare & Medicaid    Authorization Time Period  03-27-20 - 05-28-20    PT Start Time  1445    PT Stop Time  1530    PT Time Calculation (min)  45 min    Activity Tolerance  Patient tolerated treatment well    Behavior During Therapy  [facility] for tasks assessed/performed       Past Medical History:  Diagnosis Date  . Anxiety   . Cerebrovascular accident [facility])    a. 2007-->residual left sided weakness  . Chest pain   . Depression   . GERD (gastroesophageal reflux disease)   . Headache   . Hx of hysterectomy   . Hyperlipidemia   . Hypertension   . Mild to Moderate Aortic Insufficiency    a. 08/2014 Echo: EF 55-60%, mild conc LVH, no rwma, Gr 1 DD, mild-mod AI, mild MR.  . Morbid obesity ([place])   . Obesity   . Peptic ulcer disease     Past Surgical History:  Procedure Laterality Date  . ABDOMINAL HYSTERECTOMY    . 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 TUBAL LIGATION      There were no vitals filed for this visit.   Subjective Assessment - 03/27/20 1447    Subjective  Pt states the dizziness started after she got the Covid vaccine on 01-27-20; had 2nd vaccine on 02-26-20 and did not have any side effects; states when it started she could not bend down to tie her shoes or look up at ceiling; states the room used to spin when she lied down but now  it really doesn't.   States now she is having very little dizziness - is able to get clothes out of dryer, walk, etc. without much vertigo.  Pt states she has been taking Meclizine (10mg ) 2x/day and took the medication this am.    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  No/denies         [facility] PT Assessment - 03/27/20 1459      Assessment   Medical Diagnosis  BPPV    Referring Provider (PT)  Dr. Lind Guest    Onset Date/Surgical Date  --   early March 2021   Prior Therapy  None for vertigo      Precautions   Precautions  Fall;Other (comment)   due to Lt hemiparesis (not due to vertigo at this time)     Restrictions   Weight Bearing Restrictions  No      Balance Screen   Has the patient fallen in the past 6 months  No    Has the patient had a decrease in activity level because of a fear  of falling?   No    Is the patient reluctant to leave their home because of a fear of falling?   No      Home Film/video editor residence      Prior Function   Level of Independence  Independent;Independent with household mobility with device;Independent with community mobility with device      Observation/Other Assessments   Focus on Therapeutic Outcomes (FOTO)   Pt's physical FS primary measure 51/100: risk adjusted 57/100    Other Surveys   Dizziness Handicap Inventory [facility])    Dizziness Handicap Inventory (DHI)   60% (severe handicap group)            Vestibular Assessment - 03/27/20 1516      Vestibular Assessment   General Observation  Pt is a 69 yr old lady amb. with quad cane due to Lt hemiparesis due to old Rt CVA in 2007; pt reports new onset of vertigo in early March after having received the 1st Covid vaccine; pt states vertigo was severe at onset and very debilitating, however, states it is much improved  at this time and she is now able to resume actvities like getting clothes out of dryer, bending over to tie her shoes without difficulty.      Symptom Behavior   Subjective history of current problem  pt states spinning vertigo started after 1st Covid vaccine on 01-27-20; pt states vertigo has mostly resolved at this time, however, she continues to take Meclizine     Type of Dizziness   Spinning    Frequency of Dizziness  depends on the movement    Duration of Dizziness  secs to minutes    Symptom Nature  Positional    Aggravating Factors  Sitting with head tilted back;Looking up to the ceiling;Rolling to left;Forward bending    Relieving Factors  Lying supine;Head stationary    Progression of Symptoms  Better    History of similar episodes  none      Oculomotor Exam   Oculomotor Alignment  Normal    Spontaneous  Absent      Positional Testing   Dix-Hallpike  Dix-Hallpike Right;Dix-Hallpike Left    Sidelying Test  Sidelying Right;Sidelying Left    Horizontal Canal Testing  Horizontal Canal Right;Horizontal Canal Left      Dix-Hallpike Right   Dix-Hallpike Right Duration  approx. 3 secs    Dix-Hallpike Right Symptoms  No nystagmus   pt states she took Meclizine this am      Dix-Hallpike Left   Dix-Hallpike Left Duration  none    Dix-Hallpike Left Symptoms  No nystagmus      Sidelying Right   Sidelying Right Duration  approx. 5 secs    Sidelying Right Symptoms  No nystagmus      Sidelying Left   Sidelying Left Duration  none    Sidelying Left Symptoms  Other (comment)   no symptoms     Horizontal Canal Right   Horizontal Canal Right Duration  none    Horizontal Canal Right Symptoms  Other (comment)   no symptoms     Horizontal Canal Left   Horizontal Canal Left Duration  none    Horizontal Canal Left Symptoms  Normal      Positional Sensitivities   Right Hallpike  Mild dizziness    Up from Right Hallpike  Mild dizziness    Up from Left Hallpike  Lightheadedness  Objective measurements completed on examination: See above findings.     Epley maneuver for Rt BPPV performed based on pt's subjective report of symptoms;  Pt reported improvement in vertigo on 2nd rep of Epley Compared to 1st rep of Epley maneuver - indicative of resolution of BPPV         PT Education - 03/27/20 2127    Education Details  Epley maneuver for self treatment of BPPV; info from [place] on etiology of BPPV - explained crystals in canal rather than in utricle using 3D model with visual demonstration    Person(s) Educated  Patient    Methods  Explanation;Demonstration;Handout    Comprehension  Verbalized understanding       PT Short Term Goals - 03/27/20 2148      PT SHORT TERM GOAL #1   Title  same as LTG's        PT Long Term Goals - 03/27/20 2148      PT LONG TERM GOAL #1   Title  Pt will have (-) Rt Dix-Hallpike test and Rt sidelying test with no nystagmus and no c/o vertigo in either test position to indicate resolution of Rt BPPV.    Time  4    Period  Weeks    Status  New    Target Date  04/25/20      PT LONG TERM GOAL #2   Title  Pt will verbalize understanding of Epley maneuver or sit to sidelying exercise for self treatment of BPPV prn should pt have re-occurrence of BPPV episode.             Plan - 03/27/20 2135    Clinical Impression Statement  Pt is a 69 yr old lady with symptoms consistent with Rt BPPV (posterior canalithiasis), however, no nystagmus was noted during any positional testing - possibly due to suppression as pt reported she had taken Meclizine in am prior to evaluation.  Pt subjectively reported more dizziness in Rt sidelying and in Rt Dix-Hallpike test positions compared to Lt sidelying and Lt Dix-Hallpike test positions.  Pt reported less dizziness on 2nd rep of Epley compared to first rep of Epley maneuver, indicative of some resolution of BPPV.  Instructed pt to withhold taking Meclizine prior to next scheduled  appt so that no symptoms/nystagmus are potentially masked by the medication.  Pt verbalized understanding & agreement.    Personal Factors and Comorbidities  Behavior Pattern;Comorbidity 2;Fitness    Comorbidities  h/o Rt CVA in 2007; anxiety & depression, HTN, mild to moderate aortic insufficiency    Examination-Activity Limitations  Locomotion Level;Bed Mobility;Reach Overhead;Bend;Squat;Sleep    Examination-Participation Restrictions  Cleaning;Laundry;Meal Prep;Shop    Stability/Clinical Decision Making  Evolving/Moderate complexity    Clinical Decision Making  Moderate    Rehab Potential  Good    PT Frequency  1x / week    PT Duration  4 weeks    PT Treatment/Interventions  ADLs/Self Care Home Management;Canalith Repostioning;Vestibular;Gait training;Therapeutic activities;Therapeutic exercise;Balance training;Neuromuscular re-education;Patient/family education    PT Next Visit Plan  recheck Rt Dix-Hallpike (re-assess vertigo as pt has been requested to not take Meclizine prior to next appt) & treat prn; instruct in HEP - habituation (sit to Rt sidelying) prn; D/C if no vertigo present    PT Home Exercise Plan  Epley and habituation Sit to sidelying for self treatment prn    Consulted and Agree with Plan of Care  Patient       Patient will benefit from skilled therapeutic intervention  in order to improve the following deficits and impairments:  Dizziness, Decreased balance, Difficulty walking  Visit Diagnosis: BPPV (benign paroxysmal positional vertigo), right - Plan: PT plan of care cert/re-cert  Dizziness and giddiness - Plan: PT plan of care cert/re-cert     Problem List Patient Active Problem List   Diagnosis Date Noted  . Nutritional counseling 01/19/2018  . Primary osteoarthritis of left knee 01/03/2016  . Mild to Moderate Aortic Insufficiency   . Hypertension   . Hyperlipidemia   . Morbid obesity ([place])   . Pulmonary hypertension ([place]) 01/30/2013  . Orthostatic  hypotension 12/18/2012  . Aortic valve disorder 07/29/2010  . CHEST PAIN-PRECORDIAL 07/29/2010  . CYST OF THYROID 01/06/2010  . HYPOKALEMIA 01/06/2010  . HYPERLIPIDEMIA 12/24/2009  . OBESITY 12/24/2009  . HYPERTENSION, UNSPECIFIED 12/24/2009  . Personal history of unspecified circulatory disease 12/24/2009  . PEPTIC ULCER DISEASE, HX OF 12/24/2009    Alda Lea, PT 03/27/2020, 10:13 PM  [facility] [address] [place], [place], [address] Phone: [phone]   Fax:  [phone]  Name: Chelsea [name] MRN: [id number] Date of Birth: 1951-06-13

## 2020-03-28 DIAGNOSIS — I69952 Hemiplegia and hemiparesis following unspecified cerebrovascular disease affecting left dominant side: Secondary | ICD-10-CM | POA: Diagnosis not present

## 2020-03-28 DIAGNOSIS — I10 Essential (primary) hypertension: Secondary | ICD-10-CM | POA: Diagnosis not present

## 2020-03-28 DIAGNOSIS — E785 Hyperlipidemia, unspecified: Secondary | ICD-10-CM | POA: Diagnosis not present

## 2020-04-03 ENCOUNTER — Other Ambulatory Visit: Payer: Self-pay

## 2020-04-03 ENCOUNTER — Ambulatory Visit: Payer: Medicare Other | Attending: Otolaryngology

## 2020-04-03 DIAGNOSIS — R42 Dizziness and giddiness: Secondary | ICD-10-CM | POA: Diagnosis not present

## 2020-04-03 DIAGNOSIS — E250 Congenital adrenogenital disorders associated with enzyme deficiency: Secondary | ICD-10-CM | POA: Diagnosis not present

## 2020-04-03 DIAGNOSIS — H8111 Benign paroxysmal vertigo, right ear: Secondary | ICD-10-CM | POA: Insufficient documentation

## 2020-04-03 NOTE — Therapy (Signed)
[facility] [address] [place], [place], [address] Phone: [phone]   Fax:  [phone]  Physical Therapy Treatment  Patient Details  Name: Chelsea [name] MRN: [id number] Date of Birth: 30-Jun-1951 Referring Provider (PT): Dr. Lind Guest   Encounter Date: 04/03/2020    Past Medical History:  Diagnosis Date  . Anxiety   . Cerebrovascular accident [facility])    a. 2007-->residual left sided weakness  . Chest pain   . Depression   . GERD (gastroesophageal reflux disease)   . Headache   . Hx of hysterectomy   . Hyperlipidemia   . Hypertension   . Mild to Moderate Aortic Insufficiency    a. 08/2014 Echo: EF 55-60%, mild conc LVH, no rwma, Gr 1 DD, mild-mod AI, mild MR.  . Morbid obesity ([place])   . Obesity   . Peptic ulcer disease     Past Surgical History:  Procedure Laterality Date  . ABDOMINAL HYSTERECTOMY    . 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 TUBAL LIGATION      There were no vitals filed for this visit.  Subjective Assessment - 04/03/20 1540    Subjective  Pt reports she stopped taking Meclizine since last session. PT reports she gets little dizzy when she moves her head too quickly while working. Pt reports she is not reporting dizzy when getting and out of bed. Pt reports sometimes when she is walking, she feels like she is stumbling forward.    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 No nystagmus noted in both trials  Standing unilateral calf stretch: 2 x 30" L only added to HEP using a towel roll  Gaze stabilization:  seated: vertical and horizontal 20x  Gait training: 1 x 230', cues to take smaller step with R and longer step with L, improve heel to toe walking, standing up straight  HEP: Standing unilateral calf stretch Gaze stabilization: horizontal and vertical head turns                          PT Short Term Goals - 03/27/20 2148      PT SHORT TERM GOAL #1   Title  same as LTG's        PT Long Term Goals - 03/27/20 2148      PT LONG TERM GOAL #1   Title  Pt will have (-) Rt Dix-Hallpike test and Rt sidelying test with no nystagmus and no c/o vertigo in either test position to indicate resolution of Rt BPPV.    Time  4    Period  Weeks    Status  New    Target Date  04/25/20      PT LONG TERM GOAL #2   Title  Pt will verbalize understanding of Epley maneuver or sit to sidelying exercise for self treatment of BPPV prn should pt have re-occurrence of BPPV episode.              Patient will benefit from skilled therapeutic intervention in order to improve the following deficits  and impairments:     Visit Diagnosis: BPPV (benign paroxysmal positional vertigo), right  54  Dizziness and giddiness     Problem List Patient Active Problem List   Diagnosis Date Noted  . 54 04/03/2020  . Nutritional counseling 01/19/2018  . Primary osteoarthritis of left knee 01/03/2016  . Mild to Moderate Aortic Insufficiency   . Hypertension   . Hyperlipidemia   . Morbid obesity ([place])   . Pulmonary hypertension ([place]) 01/30/2013  . Orthostatic hypotension 12/18/2012  . Aortic valve disorder 07/29/2010  . CHEST PAIN-PRECORDIAL 07/29/2010  . CYST OF THYROID 01/06/2010  . HYPOKALEMIA 01/06/2010  . HYPERLIPIDEMIA 12/24/2009  . OBESITY 12/24/2009  . HYPERTENSION, UNSPECIFIED 12/24/2009  . Personal history of unspecified circulatory disease 12/24/2009  . PEPTIC ULCER DISEASE, HX OF 12/24/2009    Kerrie Pleasure 04/03/2020, 3:55 PM  [facility] [address] [place], [place], [address] Phone: [phone]   Fax:  [phone]  Name: Rokeisha Beith MRN: [id number] Date of Birth: 1951-09-21

## 2020-04-10 DIAGNOSIS — I10 Essential (primary) hypertension: Secondary | ICD-10-CM | POA: Diagnosis not present

## 2020-04-10 DIAGNOSIS — E1169 Type 2 diabetes mellitus with other specified complication: Secondary | ICD-10-CM | POA: Diagnosis not present

## 2020-04-18 DIAGNOSIS — K219 Gastro-esophageal reflux disease without esophagitis: Secondary | ICD-10-CM | POA: Diagnosis not present

## 2020-04-29 ENCOUNTER — Ambulatory Visit: Payer: Medicare Other | Admitting: Registered"

## 2020-04-30 DIAGNOSIS — M545 Low back pain: Secondary | ICD-10-CM | POA: Diagnosis not present

## 2020-04-30 DIAGNOSIS — I10 Essential (primary) hypertension: Secondary | ICD-10-CM | POA: Diagnosis not present

## 2020-05-02 DIAGNOSIS — R600 Localized edema: Secondary | ICD-10-CM | POA: Diagnosis not present

## 2020-05-02 DIAGNOSIS — Z0110 Encounter for examination of ears and hearing without abnormal findings: Secondary | ICD-10-CM | POA: Diagnosis not present

## 2020-05-02 DIAGNOSIS — Z136 Encounter for screening for cardiovascular disorders: Secondary | ICD-10-CM | POA: Diagnosis not present

## 2020-05-02 DIAGNOSIS — Z131 Encounter for screening for diabetes mellitus: Secondary | ICD-10-CM | POA: Diagnosis not present

## 2020-05-02 DIAGNOSIS — Z0000 Encounter for general adult medical examination without abnormal findings: Secondary | ICD-10-CM | POA: Diagnosis not present

## 2020-05-02 DIAGNOSIS — Z1389 Encounter for screening for other disorder: Secondary | ICD-10-CM | POA: Diagnosis not present

## 2020-05-06 DIAGNOSIS — M4692 Unspecified inflammatory spondylopathy, cervical region: Secondary | ICD-10-CM | POA: Diagnosis not present

## 2020-05-06 DIAGNOSIS — M542 Cervicalgia: Secondary | ICD-10-CM | POA: Diagnosis not present

## 2020-05-12 ENCOUNTER — Encounter: Payer: Medicare Other | Attending: Family Medicine | Admitting: Registered"

## 2020-05-12 ENCOUNTER — Other Ambulatory Visit: Payer: Self-pay

## 2020-05-12 ENCOUNTER — Encounter: Payer: Self-pay | Admitting: Registered"

## 2020-05-12 DIAGNOSIS — R635 Abnormal weight gain: Secondary | ICD-10-CM | POA: Diagnosis not present

## 2020-05-12 DIAGNOSIS — Z79899 Other long term (current) drug therapy: Secondary | ICD-10-CM | POA: Insufficient documentation

## 2020-05-12 DIAGNOSIS — Z713 Dietary counseling and surveillance: Secondary | ICD-10-CM

## 2020-05-12 DIAGNOSIS — I252 Old myocardial infarction: Secondary | ICD-10-CM | POA: Diagnosis not present

## 2020-05-12 DIAGNOSIS — I119 Hypertensive heart disease without heart failure: Secondary | ICD-10-CM | POA: Insufficient documentation

## 2020-05-12 DIAGNOSIS — E119 Type 2 diabetes mellitus without complications: Secondary | ICD-10-CM | POA: Diagnosis not present

## 2020-05-12 NOTE — Progress Notes (Signed)
Follow Up for Medical Nutrition Therapy:  Appt start time: 4097 end time:  1500  Assessment:  Patient is here for follow-up for weight management.  Pt states she is getting head cold. States she is a little nauseated and gets over heated which happens this this time every year with same symptoms.   Pt reports she passed out on [employer] at [employer], thinks she got overheated. Pt reports she was checked out by paramedics and states she declined going to the hospital because she believed she would be fine once she was cooled off.  Pt reports she still avoids fatty foods, cooks burgers in the oven to let the grease drain off. Pt reports she is eating more vegetables: broccoli, cauliflower, baby corn.  Pt states vertigo resolved about 1-2 months ago, pt states she believes it was a reaction from [place] vaccination.  Pt states she feels more at ease that her new PCP will be sending her for a brain MRI to investigate after-effects from her stroke and physical therapist to help with numbness in hand.  Patients states her usually snacking has stopped since she has been sick.  Preferred Learning Style:   No preference indicated   Learning Readiness:   Change in progress  MEDICATIONS: reviewed   DIETARY INTAKE:   Avoided foods include pork and tuna fish, mushrooms  24-hr recall:  B ( AM): Special K, apple sauce Snk ( AM):  none L ( PM):  1/2 bologna sandwich, bowl of soup, ginger ale Snk ( PM):  D ( PM): , chicken alfredo, apple sauce, grape drink Snk ( PM): none Beverages: same as usual: fruit flavored water, green tea, regular ginger ale when nauseated  Usual physical activity: ADLs.  Estimated energy needs: 1600 calories 180 g carbohydrates 120 g protein 44 g fat  Progress Towards Goal(s):  in progress.    Nutritional Diagnosis:  [place]-3.3 Overweight/obesity As related to hx of energy dense food choices and sugary beverages.  As evidenced by BMI of 35.9     Intervention:  Nutrition counseling. Importance of staying hydrated so dehydration won't contribute to dizziness. Rinse canned beans to reduce sodium.  Plan: Eat a full meal before going to the store to help reduce temptation to buy snacky foods. Continue stretching and moving  Teaching Method Utilized:  Visual Auditory  Handouts given during visit include: none  Barriers to learning/adherence to lifestyle change: limited finances and reduced mobility due to effects of stroke  Demonstrated degree of understanding via:  Teach Back   Monitoring/Evaluation:  Dietary intake, exercise, and body weight 3 months.

## 2020-05-12 NOTE — Patient Instructions (Addendum)
Since you have stopped having snacks, plan to continue to keep up giving up this habit.  Eat a full meal before going to the store to help reduce temptation to buy snacky foods. Continue stretching and moving

## 2020-05-17 ENCOUNTER — Other Ambulatory Visit: Payer: Self-pay | Admitting: Adult Health

## 2020-05-22 ENCOUNTER — Ambulatory Visit: Payer: Medicare Other | Attending: Family Medicine | Admitting: Physical Therapy

## 2020-05-22 ENCOUNTER — Encounter: Payer: Self-pay | Admitting: Physical Therapy

## 2020-05-22 ENCOUNTER — Other Ambulatory Visit: Payer: Self-pay

## 2020-05-22 DIAGNOSIS — M6281 Muscle weakness (generalized): Secondary | ICD-10-CM | POA: Insufficient documentation

## 2020-05-22 DIAGNOSIS — M542 Cervicalgia: Secondary | ICD-10-CM | POA: Diagnosis not present

## 2020-05-22 NOTE — Therapy (Signed)
[place], [place], [address] Phone: [phone]   Fax:  [phone]  Physical Therapy Evaluation  Patient Details  Name: Chelsea [name] MRN: [id number] Date of Birth: 1951/11/10 Referring Provider (PT): Gentry Fitz, MD   Encounter Date: 05/22/2020   PT End of Session - 05/22/20 1354    Visit Number 1    Number of Visits 6    Date for PT Re-Evaluation 07/03/20    Authorization Type UHC Medicare & Medicaid    PT Start Time 1400    PT Stop Time 1443    PT Time Calculation (min) 43 min    Activity Tolerance Patient tolerated treatment well    Behavior During Therapy [facility] for tasks assessed/performed           Past Medical History:  Diagnosis Date  . Anxiety   . Cerebrovascular accident [facility])    a. 2007-->residual left sided weakness  . Chest pain   . Depression   . GERD (gastroesophageal reflux disease)   . Headache   . Hx of hysterectomy   . Hyperlipidemia   . Hypertension   . Mild to Moderate Aortic Insufficiency    a. 08/2014 Echo: EF 55-60%, mild conc LVH, no rwma, Gr 1 DD, mild-mod AI, mild MR.  . Morbid obesity ([place])   . Obesity   . Peptic ulcer disease     Past Surgical History:  Procedure Laterality Date  . ABDOMINAL HYSTERECTOMY    . TOTAL KNEE ARTHROPLASTY Left 01/05/2016   Procedure: TOTAL KNEE ARTHROPLASTY;  Surgeon: Frederik Pear, MD;  Location: [place];  Service: Orthopedics;  Laterality: Left;  . TUBAL LIGATION      There were no vitals filed for this visit.    Subjective Assessment - 05/22/20 1357    Subjective Patient reports she has arthritis in her neck, it sometimes bothers her and aches. She didn't know she has arthritis until they did an x-ray, but her neck has been hurting for a while. She denies any specific mechanism that brings on the pain, but states that when she is looking down while texting it brings on the pain and then when she is stressed it really  brings on the pain. She has a massage pillow she will lay her neck in that helps. She doesn't feel limited in any activities, it just hurts. Patient denies any numbness/tingling, weakness, radiating pain. She does note that she gets headaches all the time since she was young, and she is going to go back to PT for her dizziness that came back recently.    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 No limitation    How long can you stand comfortably? No limitation    How long can you walk comfortably? No limitation    Patient Stated Goals Get rid of the neck pain    Currently in Pain? Yes    Pain Score 9     Pain Location Neck    Pain Orientation Right;Left    Pain Descriptors / Indicators Aching    Pain Type Chronic pain    Pain Radiating Towards None    Pain Onset More than a month ago    Pain Frequency Intermittent    Aggravating Factors  Looking down while texting, stress, randomly comes on    Pain  Relieving Factors Massage pillow    Effect of Pain on Daily Activities Patient denies any limitations with ADLs, neck just hurts.              [facility] PT Assessment - 05/22/20 0001      Assessment   Medical Diagnosis Cervical DDD    Referring Provider (PT) Gentry Fitz, MD    Onset Date/Surgical Date --   patient reports neck pain for years   Next MD Visit Patient reports appointment in July    Prior Therapy Yes - left knee and BPPV      Precautions   Precautions Fall    Precaution Comments Patient ambulates with rollator and with history of hemiparesis      Restrictions   Weight Bearing Restrictions No      Balance Screen   Has the patient fallen in the past 6 months No    Has the patient had a decrease in activity level because of a fear of falling?  No    Is the patient reluctant to leave their home because of a fear of  falling?  No      Home Ecologist residence      Prior Function   Level of Independence Independent;Independent with community mobility with device;Independent with household mobility with device    Leisure None reported      Cognition   Overall Cognitive Status Within Functional Limits for tasks assessed      Observation/Other Assessments   Observations Patient appears in no apparent distress    Focus on Therapeutic Outcomes (FOTO)  31% limitation      Sensation   Light Touch Appears Intact      Posture/Postural Control   Posture Comments Patient exhibits rounded shoulder and forward head posture      ROM / Strength   AROM / PROM / Strength AROM;Strength      AROM   Overall AROM Comments Patient reports mild midline discomfort with all movements, tightness with side bend and rotational movements of upper trap/levator regions; shoulder AROM grossly WFL and non-painful    AROM Assessment Site Cervical    Cervical Flexion 45    Cervical Extension 20    Cervical - Right Side Bend 45    Cervical - Left Side Bend 30    Cervical - Right Rotation 50    Cervical - Left Rotation 55      Strength   Overall Strength Comments Gross strength deficit on left due to hemiparesis from previous stroke, gross strength deficit of periscapular region 4-/5 MMT    Strength Assessment Site Shoulder    Right/Left Shoulder Right;Left    Right Shoulder Flexion 5/5    Right Shoulder ABduction 5/5    Right Shoulder Internal Rotation 5/5    Right Shoulder External Rotation 4+/5    Right Shoulder Horizontal ABduction 4/5    Left Shoulder Flexion 4/5    Left Shoulder ABduction 4/5    Left Shoulder Internal Rotation 4/5    Left Shoulder External Rotation 4-/5    Left Shoulder Horizontal ABduction 4/5      Flexibility   Soft Tissue Assessment /Muscle Length yes   bilat upper trap and levator scap tighness     Palpation   Palpation comment Mildly TTP cervical  paraspinals, upper trap region      Special Tests    Special Tests Cervical    Cervical Tests Spurling's      Spurling's  Findings Negative      Transfers   Transfers Independent with all Transfers      Ambulation/Gait   Gait Comments Patient ambulates using rollator due to hemiparesis followng stroke and previous left TKA                      Objective measurements completed on examination: See above findings.       [facility] Adult PT Treatment/Exercise - 05/22/20 0001      Exercises   Exercises Neck      Neck Exercises: Seated   Neck Retraction 10 reps;3 secs    Other Seated Exercise Shoulder blade squeezes x10 3 sec hold      Neck Exercises: Stretches   Upper Trapezius Stretch 2 reps;30 seconds    Levator Stretch 2 reps;30 seconds                  PT Education - 05/22/20 1354    Education Details Exam findings, POC, HEP    Person(s) Educated Patient    Methods Explanation;Demonstration;Tactile cues;Verbal cues;Handout    Comprehension Verbalized understanding;Returned demonstration;Verbal cues required;Tactile cues required;Need further instruction            PT Short Term Goals - 05/22/20 1411      PT SHORT TERM GOAL #1   Title Patient will be I with initial HEP to progress with PT    Time 3    Period Weeks    Status New    Target Date 06/12/20      PT SHORT TERM GOAL #2   Title Patient will report neck pain </= 5/10 pain level to improve functional mobility    Time 3    Period Weeks    Status New    Target Date 06/12/20      PT SHORT TERM GOAL #3   Title Patient will be able to demonstrate proper seated posture to reduce pressure on neck    Time 3    Period Weeks    Status New    Target Date 06/12/20             PT Long Term Goals - 05/22/20 1411      PT LONG TERM GOAL #1   Title Patient will be I with final HEP to maintain progress from PT    Time 6    Period Weeks    Status New    Target Date 07/03/20      PT  LONG TERM GOAL #2   Title Patient will exhibit improved cervical rotation and side bend >/= 10 deg from baseline to indicate reduced muscle tightness    Time 6    Period Weeks    Status New    Target Date 07/03/20      PT LONG TERM GOAL #3   Title Patient will exhibit improved periscapular strength to >/= 4/5 MMT to improve posture control    Time 6    Period Weeks    Status New    Target Date 07/03/20      PT LONG TERM GOAL #4   Title Patient will report </= 3/10 pain level to improve functional ability    Time 6    Period Weeks    Status New    Target Date 07/03/20      PT LONG TERM GOAL #5   Title Patient will report improved functional ability of </= 18% limitation on FOTO    Time 6  Period Weeks    Status New    Target Date 07/03/20                  Plan - 05/22/20 1355    Clinical Impression Statement Patient presents to PT with report of chronic neck pain. She does exhibit postural deficits with increased muscle tightness and periscapular weakness that are likely contributing to her symptoms. Most of her symptoms occur when she is in a posture of looking down while on her phone due to not being able to hold her phone using left hand while typing with right. Discussed options for patient to be able to raise her phone to reduce pressure on her neck as well as avoiding remaining in static postures for extended periods of time. She was provided with exercises to improve her neck mobility and posture. She would benefit from continued skilled PT to progress psotural control and reduce muscle tightness in order to reduce neck pain while on her phone.    Personal Factors and Comorbidities Time since onset of injury/illness/exacerbation;Comorbidity 3+;Past/Current Experience;Fitness    Comorbidities h/o Rt CVA in 2007; anxiety & depression, HTN, mild to moderate aortic insufficiency    Examination-Activity Limitations Bend;Sit    Examination-Participation Restrictions  Community Activity;Cleaning;Meal Prep;Shop;[employer]    Stability/Clinical Decision Making Stable/Uncomplicated    Clinical Decision Making Low    Rehab Potential Good    PT Frequency 1x / week    PT Duration 6 weeks    PT Treatment/Interventions ADLs/Self Care Home Management;Cryotherapy;Electrical Stimulation;Moist Heat;Therapeutic exercise;Therapeutic activities;Patient/family education;Manual techniques;Dry needling;Passive range of motion;Taping;Spinal Manipulations;Joint Manipulations    PT Next Visit Plan Assess HEP and progress PRN, manual for upper trap.levator and cervical paraspinals, continues stretching for neck mobility, postural strength as able    PT Home Exercise Plan [email or web address]: chin tuck, shoulder blade squeezes, upper trap and levator stretch    Consulted and Agree with Plan of Care Patient           Patient will benefit from skilled therapeutic intervention in order to improve the following deficits and impairments:  Decreased strength, Pain, Decreased range of motion, Postural dysfunction, Decreased activity tolerance, Impaired flexibility  Visit Diagnosis: Cervicalgia  Muscle weakness (generalized)     Problem List Patient Active Problem List   Diagnosis Date Noted  . 54 04/03/2020  . Nutritional counseling 01/19/2018  . Primary osteoarthritis of left knee 01/03/2016  . Mild to Moderate Aortic Insufficiency   . Hypertension   . Hyperlipidemia   . Morbid obesity ([place])   . Pulmonary hypertension ([place]) 01/30/2013  . Orthostatic hypotension 12/18/2012  . Aortic valve disorder 07/29/2010  . CHEST PAIN-PRECORDIAL 07/29/2010  . CYST OF THYROID 01/06/2010  . HYPOKALEMIA 01/06/2010  . HYPERLIPIDEMIA 12/24/2009  . OBESITY 12/24/2009  . HYPERTENSION, UNSPECIFIED 12/24/2009  . Personal history of unspecified circulatory disease 12/24/2009  . PEPTIC ULCER DISEASE, HX OF 12/24/2009    Hilda Blades, PT, DPT, LAT, ATC 05/22/20  3:57 PM Phone:  [phone] Fax: [facility] [address] [place], [place], [address] Phone: [phone]   Fax:  [phone]  Name: Chelsea [name] MRN: [id number] Date of Birth: [birth year]

## 2020-05-22 NOTE — Patient Instructions (Signed)
Access Code: [facility] URL: [email or web address] Date: 05/22/2020 Prepared by: Hilda Blades  Exercises Seated Cervical Retraction - 3 x daily - 7 x weekly - 10 reps - 3 seconds hold Seated Scapular Retraction - 3 x daily - 7 x weekly - 10 reps - 3 seconds hold Gentle Upper Trap Stretch - 3 x daily - 7 x weekly - 2 reps - 30 seconds hold Gentle Levator Scapulae Stretch - 3 x daily - 7 x weekly - 2 reps - 30 seconds hold

## 2020-06-02 ENCOUNTER — Other Ambulatory Visit: Payer: Self-pay | Admitting: Adult Health

## 2020-06-06 DIAGNOSIS — R600 Localized edema: Secondary | ICD-10-CM | POA: Diagnosis not present

## 2020-06-06 DIAGNOSIS — M62838 Other muscle spasm: Secondary | ICD-10-CM | POA: Diagnosis not present

## 2020-06-06 DIAGNOSIS — R030 Elevated blood-pressure reading, without diagnosis of hypertension: Secondary | ICD-10-CM | POA: Diagnosis not present

## 2020-06-09 DIAGNOSIS — H25813 Combined forms of age-related cataract, bilateral: Secondary | ICD-10-CM | POA: Diagnosis not present

## 2020-06-09 DIAGNOSIS — H539 Unspecified visual disturbance: Secondary | ICD-10-CM | POA: Diagnosis not present

## 2020-06-10 DIAGNOSIS — M1711 Unilateral primary osteoarthritis, right knee: Secondary | ICD-10-CM | POA: Diagnosis not present

## 2020-06-10 DIAGNOSIS — Z96652 Presence of left artificial knee joint: Secondary | ICD-10-CM | POA: Diagnosis not present

## 2020-06-10 DIAGNOSIS — Z471 Aftercare following joint replacement surgery: Secondary | ICD-10-CM | POA: Diagnosis not present

## 2020-06-11 ENCOUNTER — Ambulatory Visit: Payer: Medicare Other | Attending: Family Medicine | Admitting: Physical Therapy

## 2020-06-11 ENCOUNTER — Encounter: Payer: Self-pay | Admitting: Physical Therapy

## 2020-06-11 ENCOUNTER — Other Ambulatory Visit: Payer: Self-pay

## 2020-06-11 DIAGNOSIS — M6281 Muscle weakness (generalized): Secondary | ICD-10-CM | POA: Diagnosis not present

## 2020-06-11 DIAGNOSIS — M542 Cervicalgia: Secondary | ICD-10-CM

## 2020-06-11 NOTE — Therapy (Signed)
[place], [place], [address] Phone: [phone]   Fax:  [phone]  Physical Therapy Treatment  Patient Details  Name: Chelsea [name] MRN: [id number] Date of Birth: Mar 03, 1951 Referring Provider (PT): Gentry Fitz, MD   Encounter Date: 06/11/2020   PT End of Session - 06/11/20 1543    Visit Number 2    Number of Visits 6    Date for PT Re-Evaluation 07/03/20    Authorization Type UHC Medicare & Medicaid    PT Start Time 1530    PT Stop Time 1608    PT Time Calculation (min) 38 min    Activity Tolerance Patient tolerated treatment well    Behavior During Therapy [facility] for tasks assessed/performed           Past Medical History:  Diagnosis Date  . Anxiety   . Cerebrovascular accident [facility])    a. 2007-->residual left sided weakness  . Chest pain   . Depression   . GERD (gastroesophageal reflux disease)   . Headache   . Hx of hysterectomy   . Hyperlipidemia   . Hypertension   . Mild to Moderate Aortic Insufficiency    a. 08/2014 Echo: EF 55-60%, mild conc LVH, no rwma, Gr 1 DD, mild-mod AI, mild MR.  . Morbid obesity ([place])   . Obesity   . Peptic ulcer disease     Past Surgical History:  Procedure Laterality Date  . ABDOMINAL HYSTERECTOMY    . 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 TUBAL LIGATION      There were no vitals filed for this visit.   Subjective Assessment - 06/11/20 1536    Subjective Patient reports she is doing well. She states that her A/C went out in her apartment and when she overheats then she feels like she passes out. She does report that she hasn't had much pain in her neck, she is consistent with her exercises and feels they are helping. She has also been putting a pillow in front of her to hold her phone higher so she doesn't look  down while typing.    Patient Stated Goals Get rid of the neck pain    Currently in Pain? No/denies    Pain Score 0-No pain    Pain Location Neck    Pain Orientation Right;Left    Pain Type Chronic pain    Pain Onset More than a month ago    Pain Frequency Intermittent              [facility] PT Assessment - 06/11/20 0001      Posture/Postural Control   Posture Comments Patient requires cueing for posture, able to correct following cues      AROM   Cervical Flexion 45    Cervical Extension 30    Cervical - Right Side Bend 45    Cervical - Left Side Bend 38                         [facility] Adult PT Treatment/Exercise - 06/11/20 0001      Exercises   Exercises Neck      Neck Exercises: Machines for Strengthening   Nustep L3 x 6 min (UE and LE)      Neck Exercises: Theraband   Rows 15 reps   2  sets with 3 second hold   Rows Limitations yellow band, seated      Neck Exercises: Seated   Neck Retraction 10 reps;3 secs    Other Seated Exercise Shoulder blade squeezes x10 3 sec hold      Manual Therapy   Manual Therapy Soft tissue mobilization    Soft tissue mobilization Bilat upper trap  region      Neck Exercises: Stretches   Upper Trapezius Stretch 3 reps;30 seconds    Levator Stretch 3 reps;30 seconds                  PT Education - 06/11/20 1542    Education Details HEP    Person(s) Educated Patient    Methods Explanation;Demonstration;Handout;Verbal cues    Comprehension Verbalized understanding;Returned demonstration;Verbal cues required;Need further instruction            PT Short Term Goals - 06/11/20 1617      PT SHORT TERM GOAL #1   Title Patient will be I with initial HEP to progress with PT    Time 3    Period Weeks    Status On-going    Target Date 06/12/20      PT SHORT TERM GOAL #2   Title Patient will report neck pain </= 5/10 pain level to improve functional mobility    Time 3    Period Weeks    Status Achieved     Target Date 06/12/20      PT SHORT TERM GOAL #3   Title Patient will be able to demonstrate proper seated posture to reduce pressure on neck    Baseline Patient requires cueing    Time 3    Period Weeks    Status On-going    Target Date 06/12/20             PT Long Term Goals - 05/22/20 1411      PT LONG TERM GOAL #1   Title Patient will be I with final HEP to maintain progress from PT    Time 6    Period Weeks    Status New    Target Date 07/03/20      PT LONG TERM GOAL #2   Title Patient will exhibit improved cervical rotation and side bend >/= 10 deg from baseline to indicate reduced muscle tightness    Time 6    Period Weeks    Status New    Target Date 07/03/20      PT LONG TERM GOAL #3   Title Patient will exhibit improved periscapular strength to >/= 4/5 MMT to improve posture control    Time 6    Period Weeks    Status New    Target Date 07/03/20      PT LONG TERM GOAL #4   Title Patient will report </= 3/10 pain level to improve functional ability    Time 6    Period Weeks    Status New    Target Date 07/03/20      PT LONG TERM GOAL #5   Title Patient will report improved functional ability of </= 18% limitation on FOTO    Time 6    Period Weeks    Status New    Target Date 07/03/20                 Plan - 06/11/20 1544    Clinical Impression Statement Patient tolerated therapy well with no adverse effects. She exhibits improved  ranged of motion and notes improvement in her symptoms overall. Progressed her postural control using resistance with row but kept all other exercises the same as she is responding well to gentle stretching and movement. Patient would benefit from continued skilled PT to improve postural control and reduce neck pain. Patient would benefit from continued skilled PT to improve postural control and reduce neck pain.    PT Treatment/Interventions ADLs/Self Care Home Management;Cryotherapy;Electrical Stimulation;Moist  Heat;Therapeutic exercise;Therapeutic activities;Patient/family education;Manual techniques;Dry needling;Passive range of motion;Taping;Spinal Manipulations;Joint Manipulations    PT Next Visit Plan Assess HEP and progress PRN, manual for upper trap.levator and cervical paraspinals, continues stretching for neck mobility, postural strength as able    PT Home Exercise Plan [email or web address]: chin tuck, shoulder blade squeezes, upper trap and levator stretch, row with yellow    Consulted and Agree with Plan of Care Patient           Patient will benefit from skilled therapeutic intervention in order to improve the following deficits and impairments:  Decreased strength, Pain, Decreased range of motion, Postural dysfunction, Decreased activity tolerance, Impaired flexibility  Visit Diagnosis: Cervicalgia  Muscle weakness (generalized)     Problem List Patient Active Problem List   Diagnosis Date Noted  . 54 04/03/2020  . Nutritional counseling 01/19/2018  . Primary osteoarthritis of left knee 01/03/2016  . Mild to Moderate Aortic Insufficiency   . Hypertension   . Hyperlipidemia   . Morbid obesity ([place])   . Pulmonary hypertension ([place]) 01/30/2013  . Orthostatic hypotension 12/18/2012  . Aortic valve disorder 07/29/2010  . CHEST PAIN-PRECORDIAL 07/29/2010  . CYST OF THYROID 01/06/2010  . HYPOKALEMIA 01/06/2010  . HYPERLIPIDEMIA 12/24/2009  . OBESITY 12/24/2009  . HYPERTENSION, UNSPECIFIED 12/24/2009  . Personal history of unspecified circulatory disease 12/24/2009  . PEPTIC ULCER DISEASE, HX OF 12/24/2009    Hilda Blades, PT, DPT, LAT, ATC 06/11/20  4:20 PM Phone: [phone] Fax: [facility] [address] [place], [place], [address] Phone: [phone]   Fax:  [phone]  Name: Chelsea [name] MRN: [id number] Date of Birth: Mar 17, 1951

## 2020-06-17 DIAGNOSIS — M542 Cervicalgia: Secondary | ICD-10-CM | POA: Diagnosis not present

## 2020-06-18 ENCOUNTER — Other Ambulatory Visit: Payer: Self-pay | Admitting: Interventional Cardiology

## 2020-06-24 ENCOUNTER — Ambulatory Visit: Payer: Medicare Other | Admitting: Physical Therapy

## 2020-06-27 ENCOUNTER — Encounter: Payer: Self-pay | Admitting: Physical Therapy

## 2020-06-27 ENCOUNTER — Ambulatory Visit: Payer: Medicare Other | Admitting: Physical Therapy

## 2020-06-27 ENCOUNTER — Other Ambulatory Visit: Payer: Self-pay

## 2020-06-27 DIAGNOSIS — M542 Cervicalgia: Secondary | ICD-10-CM | POA: Diagnosis not present

## 2020-06-27 DIAGNOSIS — M6281 Muscle weakness (generalized): Secondary | ICD-10-CM | POA: Diagnosis not present

## 2020-06-27 NOTE — Therapy (Signed)
[place], [place], [address] Phone: [phone]   Fax:  [phone]  Physical Therapy Treatment  Patient Details  Name: Chelsea [name] MRN: [id number] Date of Birth: 1951/03/21 Referring Provider (PT): Gentry Fitz, MD   Encounter Date: 06/27/2020   PT End of Session - 06/27/20 1140    Visit Number 3    Number of Visits 6    Date for PT Re-Evaluation 07/03/20    Authorization Type UHC Medicare & Medicaid    Authorization Time Period --    PT Start Time 1130    PT Stop Time 1210    PT Time Calculation (min) 40 min    Activity Tolerance Patient tolerated treatment well    Behavior During Therapy [facility] for tasks assessed/performed           Past Medical History:  Diagnosis Date  . Anxiety   . Cerebrovascular accident [facility])    a. 2007-->residual left sided weakness  . Chest pain   . Depression   . GERD (gastroesophageal reflux disease)   . Headache   . Hx of hysterectomy   . Hyperlipidemia   . Hypertension   . Mild to Moderate Aortic Insufficiency    a. 08/2014 Echo: EF 55-60%, mild conc LVH, no rwma, Gr 1 DD, mild-mod AI, mild MR.  . Morbid obesity ([place])   . Obesity   . Peptic ulcer disease     Past Surgical History:  Procedure Laterality Date  . ABDOMINAL HYSTERECTOMY    . 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 TUBAL LIGATION      There were no vitals filed for this visit.   Subjective Assessment - 06/27/20 1132    Subjective Patient reports she is doing alright, her A/C unit is still out and states that she still passes out when she gets overheated and her vertigo gets worse. Her neck is still feeling better.    Patient Stated Goals Get rid of the neck pain    Currently in Pain? No/denies              [facility] - 06/27/20 0001        Observation/Other Assessments   Focus on Therapeutic Outcomes (FOTO)  Neck: 26% limitation                         [facility] Adult PT Treatment/Exercise - 06/27/20 0001      Neck Exercises: Machines for Strengthening   Nustep L3 x 6 min (UE and LE)      Neck Exercises: Theraband   Rows 15 reps;Red   2 sets, 3 sec hold   Rows Limitations seated    Shoulder External Rotation 15 reps   2 sets   Shoulder External Rotation Limitations yellow, double      Neck Exercises: Seated   Neck Retraction 10 reps;3 secs    Other Seated Exercise Shoulder blade squeezes x10 3 sec hold    Other Seated Exercise Cervical AROM all directions to tolerance, small cervical circles      Neck Exercises: Stretches   Upper Trapezius Stretch 3 reps;30 seconds    Levator Stretch 3 reps;30 seconds                  PT Education - 06/27/20 1135  Education Details FOTO, HEP, making sure she keeps propping her phone up to keep pressure off her neck    Person(s) Educated Patient    Methods Explanation;Demonstration;Verbal cues    Comprehension Verbalized understanding;Returned demonstration;Verbal cues required;Need further instruction            PT Short Term Goals - 06/27/20 1143      PT SHORT TERM GOAL #1   Title Patient will be I with initial HEP to progress with PT    Baseline Patient indepdendent with HEP - 06/27/2020    Time 3    Period Weeks    Status Achieved      PT SHORT TERM GOAL #2   Title Patient will report neck pain </= 5/10 pain level to improve functional mobility    Baseline Patient reports no pain past 2 visit - 06/27/2020    Time 3    Period Weeks    Status Achieved    Target Date 06/12/20      PT SHORT TERM GOAL #3   Title Patient will be able to demonstrate proper seated posture to reduce pressure on neck    Baseline Patient exhibits improved seated posture - 06/27/2020    Time 3    Period Weeks    Status Achieved    Target Date 06/12/20              PT Long Term Goals - 06/27/20 1144      PT LONG TERM GOAL #1   Title Patient will be I with final HEP to maintain progress from PT    Time 6    Period Weeks    Status On-going    Target Date 07/03/20      PT LONG TERM GOAL #2   Title Patient will exhibit improved cervical rotation and side bend >/= 10 deg from baseline to indicate reduced muscle tightness    Time 6    Period Weeks    Status On-going    Target Date 07/03/20      PT LONG TERM GOAL #3   Title Patient will exhibit improved periscapular strength to >/= 4/5 MMT to improve posture control    Time 6    Period Weeks    Status On-going    Target Date 07/03/20      PT LONG TERM GOAL #4   Title Patient will report </= 3/10 pain level to improve functional ability    Baseline Patient reports no pain - 06/27/2020    Time 6    Period Weeks    Status Achieved      PT LONG TERM GOAL #5   Title Patient will report improved functional ability of </= 18% limitation on FOTO    Time 6    Period Weeks    Status On-going    Target Date 07/03/20                 Plan - 06/27/20 1142    Clinical Impression Statement Patient tolerated therapy well with no adverse effects. Patient reports improved functional level per FOTO this visit and states she continues to have no pain. Progressed strengthening exercises this visit with good tolerance. She was encouraged to continue stretching and modifying phone height to avoid stress on neck. Patient would benefit from continued skilled PT to improve postural control and reduce neck pain.    PT Treatment/Interventions ADLs/Self Care Home Management;Cryotherapy;Electrical Stimulation;Moist Heat;Therapeutic exercise;Therapeutic activities;Patient/family education;Manual techniques;Dry needling;Passive range of motion;Taping;Spinal Manipulations;Joint Manipulations  PT Next Visit Plan Assess HEP and progress PRN, manual for upper trap.levator and cervical paraspinals, continues  stretching for neck mobility, postural strength as able    PT Home Exercise Plan [id number]: chin tuck, shoulder blade squeezes, upper trap and levator stretch, row with red, double ER with yellow    Consulted and Agree with Plan of Care Patient           Patient will benefit from skilled therapeutic intervention in order to improve the following deficits and impairments:  Decreased strength, Pain, Decreased range of motion, Postural dysfunction, Decreased activity tolerance, Impaired flexibility  Visit Diagnosis: Cervicalgia  Muscle weakness (generalized)     Problem List Patient Active Problem List   Diagnosis Date Noted  . 54 04/03/2020  . Nutritional counseling 01/19/2018  . Primary osteoarthritis of left knee 01/03/2016  . Mild to Moderate Aortic Insufficiency   . Hypertension   . Hyperlipidemia   . Morbid obesity ([place])   . Pulmonary hypertension ([place]) 01/30/2013  . Orthostatic hypotension 12/18/2012  . Aortic valve disorder 07/29/2010  . CHEST PAIN-PRECORDIAL 07/29/2010  . CYST OF THYROID 01/06/2010  . HYPOKALEMIA 01/06/2010  . HYPERLIPIDEMIA 12/24/2009  . OBESITY 12/24/2009  . HYPERTENSION, UNSPECIFIED 12/24/2009  . Personal history of unspecified circulatory disease 12/24/2009  . PEPTIC ULCER DISEASE, HX OF 12/24/2009    Hilda Blades, PT, DPT, LAT, ATC 06/27/20  12:19 PM Phone: [phone] Fax: [facility] [address] [place], [place], [address] Phone: [phone]   Fax:  [phone]  Name: Chelsea [name] MRN: [id number] Date of Birth: January 27, 1951

## 2020-06-27 NOTE — Patient Instructions (Signed)
Access Code: [facility] URL: [email or web address] Date: 06/27/2020 Prepared by: Hilda Blades  Exercises Seated Cervical Retraction - 3 x daily - 7 x weekly - 10 reps - 3 seconds hold Seated Small Neck Circles - 3 x daily - 7 x weekly - 10 reps Seated Scapular Retraction - 3 x daily - 7 x weekly - 10 reps - 3 seconds hold Gentle Upper Trap Stretch - 3 x daily - 7 x weekly - 2 reps - 30 seconds hold Gentle Levator Scapulae Stretch - 3 x daily - 7 x weekly - 2 reps - 30 seconds hold Standing Row with Anchored Resistance - 2 x daily - 7 x weekly - 30 reps Shoulder External Rotation and Scapular Retraction with Resistance - 2 x daily - 7 x weekly - 30 reps

## 2020-07-10 ENCOUNTER — Other Ambulatory Visit: Payer: Self-pay

## 2020-07-10 ENCOUNTER — Ambulatory Visit: Payer: Medicare Other | Attending: Family Medicine | Admitting: Physical Therapy

## 2020-07-10 ENCOUNTER — Encounter: Payer: Self-pay | Admitting: Physical Therapy

## 2020-07-10 DIAGNOSIS — M6281 Muscle weakness (generalized): Secondary | ICD-10-CM | POA: Diagnosis not present

## 2020-07-10 DIAGNOSIS — H8111 Benign paroxysmal vertigo, right ear: Secondary | ICD-10-CM | POA: Diagnosis not present

## 2020-07-10 DIAGNOSIS — M542 Cervicalgia: Secondary | ICD-10-CM | POA: Insufficient documentation

## 2020-07-10 NOTE — Therapy (Signed)
[place] [address] [place], [place], [address] Phone: [phone]   Fax:  [phone]  Physical Therapy Treatment  Patient Details  Name: Chelsea [name] MRN: [id number] Date of Birth: [birth year] Referring Provider (PT): Gentry Fitz, MD   Encounter Date: 07/10/2020   PT End of Session - 07/10/20 2012    Visit Number 4   visit 1 for vertigo   Number of Visits 6    Date for PT Re-Evaluation 07/03/20    Authorization Type UHC Medicare & Medicaid    PT Start Time 6144    PT Stop Time 1655    PT Time Calculation (min) 38 min    Activity Tolerance Patient tolerated treatment well    Behavior During Therapy [facility] for tasks assessed/performed           Past Medical History:  Diagnosis Date  . Anxiety   . Cerebrovascular accident [facility])    a. 2007-->residual left sided weakness  . Chest pain   . Depression   . GERD (gastroesophageal reflux disease)   . Headache   . Hx of hysterectomy   . Hyperlipidemia   . Hypertension   . Mild to Moderate Aortic Insufficiency    a. 08/2014 Echo: EF 55-60%, mild conc LVH, no rwma, Gr 1 DD, mild-mod AI, mild MR.  . Morbid obesity ([place])   . Obesity   . Peptic ulcer disease     Past Surgical History:  Procedure Laterality Date  . ABDOMINAL HYSTERECTOMY    . 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 TUBAL LIGATION      There were no vitals filed for this visit.   Subjective Assessment - 07/10/20 1627    Subjective Pt reports she had episode of vertigo last night when she was in bathroom; states her body was feeling funny; she walked out of bathroom with the cane and fell (has bruises on her Lt arm); is now using the rollator for assistance with ambulation.  Pt states her AC is out in her home and she is unable to tolerate the excessive heat  in her townhome - passed out - her grandson happened to come by and check on her and found her on the floor and assisted her to standing. Pt denies dizziness at this time.   pt states she is returning to OP Neuro because she has had dizziness (but attributes the dizziness to heat due to her [facility] unit in [place] not working at this time)   Patient Stated Goals Get rid of the neck pain    Currently in Pain? Yes   pt reports neck feels achy from the fall last night   Pain Score 9     Pain Location Neck    Pain Orientation Left    Pain Descriptors / Indicators Aching    Pain Type Acute pain    Pain Onset Yesterday    Pain Frequency Constant    Pain Relieving Factors pain medication           Neuro Re-ed: Positional testing for BPPV as pt states she had vertiginous episode last evening at home which resulted in a fall (07-09-20)  (-) Rt and Lt Dix-Hallpike tests with no nystagmus and no c/o vertigo   Rt and Lt sidelying tests (-) with no nystagmus and no c/o vertigo in either position;  Pt also reported no dizziness with sidelying to sitting from either side   Pt requesting note be written to her property manager stating urgent need for [facility] unit repair as she attributes the syncopal episode/dizziness resulting in the fall due to her getting overheated due to lack of AC in her townhome; note written and given to pt                                     Plan - 07/10/20 2027    Clinical Impression Statement Pt has no signs or symptoms of BPPV at this time as Rt and Lt Dix-Hallpike tests (-) with no nystagmus noted with any positional testing and no c/o vertigo reported.  Pt attributes cause of dizziness/syncopal episode experienced last evening (07-09-20) resulting in fall to be due to overheating due to her [facility] unit in her townhome not working.  Pt requested note be written to property manager stating urgent need for [facility] repair to avoid overheating which leads to  dizziness/syncopal episodes and then a fall. Note was written and given to patient. No additional visits needed for vertigo at this time due to no dizziness at this time.    PT Treatment/Interventions ADLs/Self Care Home Management;Cryotherapy;Electrical Stimulation;Moist Heat;Therapeutic exercise;Therapeutic activities;Patient/family education;Manual techniques;Dry needling;Passive range of motion;Taping;Spinal Manipulations;Joint Manipulations    PT Next Visit Plan Assess HEP and progress PRN, manual for upper trap.levator and cervical paraspinals, continues stretching for neck mobility, postural strength as able    PT Home Exercise Plan [id number]: chin tuck, shoulder blade squeezes, upper trap and levator stretch, row with red, double ER with yellow    Consulted and Agree with Plan of Care Patient           Patient will benefit from skilled therapeutic intervention in order to improve the following deficits and impairments:  Decreased strength, Pain, Decreased range of motion, Postural dysfunction, Decreased activity tolerance, Impaired flexibility  Visit Diagnosis: BPPV (benign paroxysmal positional vertigo), right - Plan: PT plan of care cert/re-cert     Problem List Patient Active Problem List   Diagnosis Date Noted  . 53 04/03/2020  . Nutritional counseling 01/19/2018  . Primary osteoarthritis of left knee 01/03/2016  . Mild to Moderate Aortic Insufficiency   . Hypertension   . Hyperlipidemia   . Morbid obesity ([place])   . Pulmonary hypertension ([place]) 01/30/2013  . Orthostatic hypotension 12/18/2012  . Aortic valve disorder 07/29/2010  . CHEST PAIN-PRECORDIAL 07/29/2010  . CYST OF THYROID 01/06/2010  . HYPOKALEMIA 01/06/2010  . HYPERLIPIDEMIA 12/24/2009  . OBESITY 12/24/2009  . HYPERTENSION, UNSPECIFIED 12/24/2009  . Personal history of unspecified circulatory disease 12/24/2009  . PEPTIC ULCER DISEASE, HX OF 12/24/2009    Alda Lea, PT 07/10/2020, 8:41  PM  [facility] [address] [place], [place], [address] Phone: [phone]   Fax:  [phone]  Name: Chelsea [name] MRN: [id number] Date of Birth: Jun 01, 1951

## 2020-07-11 ENCOUNTER — Ambulatory Visit: Payer: Medicare Other | Admitting: Physical Therapy

## 2020-07-11 ENCOUNTER — Encounter: Payer: Self-pay | Admitting: Physical Therapy

## 2020-07-11 ENCOUNTER — Other Ambulatory Visit: Payer: Self-pay

## 2020-07-11 DIAGNOSIS — M542 Cervicalgia: Secondary | ICD-10-CM | POA: Diagnosis not present

## 2020-07-11 DIAGNOSIS — H8111 Benign paroxysmal vertigo, right ear: Secondary | ICD-10-CM | POA: Diagnosis not present

## 2020-07-11 DIAGNOSIS — M6281 Muscle weakness (generalized): Secondary | ICD-10-CM

## 2020-07-11 NOTE — Patient Instructions (Signed)
Access Code: [facility] URL: [email or web address] Date: 07/11/2020 Prepared by: Hilda Blades  Exercises Seated Cervical Retraction - 3 x daily - 7 x weekly - 10 reps - 3 seconds hold Seated Small Neck Circles - 3 x daily - 7 x weekly - 10 reps Seated Scapular Retraction - 3 x daily - 7 x weekly - 10 reps - 3 seconds hold Gentle Upper Trap Stretch - 3 x daily - 7 x weekly - 2 reps - 30 seconds hold Gentle Levator Scapulae Stretch - 3 x daily - 7 x weekly - 2 reps - 30 seconds hold Standing Row with Anchored Resistance - 2 x daily - 7 x weekly - 30 reps Shoulder Extension with Band - 2 x daily - 7 x weekly - 30 reps Shoulder External Rotation and Scapular Retraction with Resistance - 2 x daily - 7 x weekly - 30 reps

## 2020-07-11 NOTE — Therapy (Signed)
[place], [place], [address] Phone: [phone]   Fax:  [phone]  Physical Therapy Treatment / Discharge  Patient Details  Name: Chelsea [name] MRN: [id number] Date of Birth: [birth year] Referring Provider (PT): Gentry Fitz, MD   Encounter Date: 07/11/2020   PT End of Session - 07/11/20 1135    Visit Number 5    Number of Visits 6    Date for PT Re-Evaluation 07/03/20    PT Start Time 1130    PT Stop Time 1210    PT Time Calculation (min) 40 min    Activity Tolerance Patient tolerated treatment well    Behavior During Therapy [facility] for tasks assessed/performed           Past Medical History:  Diagnosis Date  . Anxiety   . Cerebrovascular accident [facility])    a. 2007-->residual left sided weakness  . Chest pain   . Depression   . GERD (gastroesophageal reflux disease)   . Headache   . Hx of hysterectomy   . Hyperlipidemia   . Hypertension   . Mild to Moderate Aortic Insufficiency    a. 08/2014 Echo: EF 55-60%, mild conc LVH, no rwma, Gr 1 DD, mild-mod AI, mild MR.  . Morbid obesity ([place])   . Obesity   . Peptic ulcer disease     Past Surgical History:  Procedure Laterality Date  . ABDOMINAL HYSTERECTOMY    . 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 TUBAL LIGATION      There were no vitals filed for this visit.   Subjective Assessment - 07/11/20 1131    Subjective Patient reports she fell a few days ago because the [facility] is still not working in her town home and she passes out when she gets hot. She does report her neck was hurting yesterday on the left side because she must have pulled something from her fall. Today it is feeling a little bit better.    Patient Stated Goals Get rid of the neck pain    Currently in Pain? Yes    Pain Score 4     Pain  Location Neck    Pain Orientation Left    Pain Descriptors / Indicators Aching    Pain Type Chronic pain    Pain Onset More than a month ago    Pain Frequency Intermittent              [facility] PT Assessment - 07/11/20 0001      Assessment   Medical Diagnosis Cervical DDD    Referring Provider (PT) Gentry Fitz, MD    Next MD Visit Not scheduled      Precautions   Precautions Fall      Restrictions   Weight Bearing Restrictions No      Balance Screen   Has the patient fallen in the past 6 months Yes    How many times? Patient fell on 07/09/2020 due to passing out from heat in town home    Has the patient had a decrease in activity level because of a fear of falling?  No    Is the patient reluctant to leave their home because of a fear of falling?  No      Prior Function   Level of Independence Independent;Independent with community mobility with device;Independent with household  mobility with device      Cognition   Overall Cognitive Status Within Functional Limits for tasks assessed      Observation/Other Assessments   Focus on Therapeutic Outcomes (FOTO)  18% limitation      AROM   Cervical Flexion 50    Cervical Extension 35    Cervical - Right Side Bend 45    Cervical - Left Side Bend 45    Cervical - Right Rotation 65    Cervical - Left Rotation 65      Strength   Overall Strength Comments Periscapular strength 4/5 MMT right, left still exhibits 4-/5 MMT secondary to previous stroke                         [facility] Adult PT Treatment/Exercise - 07/11/20 0001      Self-Care   Self-Care Other Self-Care Comments    Other Self-Care Comments  See patient education      Exercises   Exercises Neck      Neck Exercises: Machines for Strengthening   Nustep L5 x 5 min (UE and LE)      Neck Exercises: Seated   Neck Retraction 5 reps;3 secs    Other Seated Exercise Seated row, extension x30 each with red band, seated double ER wx30 with yellow  band    Other Seated Exercise Cervical AROM all directions to tolerance, small cervical circles      Neck Exercises: Stretches   Upper Trapezius Stretch 30 seconds    Levator Stretch 30 seconds                  PT Education - 07/11/20 1134    Education Details POC discharge, HEP to tolerance, gentle stretching    Person(s) Educated Patient    Methods Explanation;Demonstration    Comprehension Verbalized understanding;Returned demonstration            PT Short Term Goals - 06/27/20 1143      PT SHORT TERM GOAL #1   Title Patient will be I with initial HEP to progress with PT    Baseline Patient indepdendent with HEP - 06/27/2020    Time 3    Period Weeks    Status Achieved      PT SHORT TERM GOAL #2   Title Patient will report neck pain </= 5/10 pain level to improve functional mobility    Baseline Patient reports no pain past 2 visit - 06/27/2020    Time 3    Period Weeks    Status Achieved    Target Date 06/12/20      PT SHORT TERM GOAL #3   Title Patient will be able to demonstrate proper seated posture to reduce pressure on neck    Baseline Patient exhibits improved seated posture - 06/27/2020    Time 3    Period Weeks    Status Achieved    Target Date 06/12/20             PT Long Term Goals - 07/11/20 1140      PT LONG TERM GOAL #1   Title Patient will be I with final HEP to maintain progress from PT    Time 6    Period Weeks    Status Achieved      PT LONG TERM GOAL #2   Title Patient will exhibit improved cervical rotation and side bend >/= 10 deg from baseline to indicate reduced muscle tightness  Time 6    Period Weeks    Status Achieved      PT LONG TERM GOAL #3   Title Patient will exhibit improved periscapular strength to >/= 4/5 MMT to improve posture control    Baseline Left side still limited due to weakness secondary to stroke    Time 6    Period Weeks    Status Achieved      PT LONG TERM GOAL #4   Title Patient will report  </= 3/10 pain level to improve functional ability    Baseline Patient reported 4/10 soreness this visit from recent fall but prevously was reporting no pain.    Time 6    Period Weeks    Status Achieved      PT LONG TERM GOAL #5   Title Patient will report improved functional ability of </= 18% limitation on FOTO    Time 6    Period Weeks    Status Achieved                 Plan - 07/11/20 1139    Clinical Impression Statement Patient tolerated therapy well this visit. She reported a little more soreness this visit due to recent fall but overall she is doing well with her exercises and feels she is ready to be discharged from PT at this time, and she will follow-up with her doctor as needed. Patient has achieved all established goals and will be formally discharged from PT to independent HEP and wil follow-up with her doctor if needed for new PT referral.    PT Treatment/Interventions ADLs/Self Care Home Management;Cryotherapy;Electrical Stimulation;Moist Heat;Therapeutic exercise;Therapeutic activities;Patient/family education;Manual techniques;Dry needling;Passive range of motion;Taping;Spinal Manipulations;Joint Manipulations    PT Next Visit Plan NA - discharge    PT Home Exercise Plan [id number]: chin tuck, shoulder blade squeezes, upper trap and levator stretch, row with red, double ER with yellow    Consulted and Agree with Plan of Care Patient           Patient will benefit from skilled therapeutic intervention in order to improve the following deficits and impairments:  Decreased strength, Pain, Decreased range of motion, Postural dysfunction, Decreased activity tolerance, Impaired flexibility  Visit Diagnosis: Cervicalgia  Muscle weakness (generalized)     Problem List Patient Active Problem List   Diagnosis Date Noted  . 53 04/03/2020  . Nutritional counseling 01/19/2018  . Primary osteoarthritis of left knee 01/03/2016  . Mild to Moderate Aortic Insufficiency    . Hypertension   . Hyperlipidemia   . Morbid obesity ([place])   . Pulmonary hypertension ([place]) 01/30/2013  . Orthostatic hypotension 12/18/2012  . Aortic valve disorder 07/29/2010  . CHEST PAIN-PRECORDIAL 07/29/2010  . CYST OF THYROID 01/06/2010  . HYPOKALEMIA 01/06/2010  . HYPERLIPIDEMIA 12/24/2009  . OBESITY 12/24/2009  . HYPERTENSION, UNSPECIFIED 12/24/2009  . Personal history of unspecified circulatory disease 12/24/2009  . PEPTIC ULCER DISEASE, HX OF 12/24/2009    PHYSICAL THERAPY DISCHARGE SUMMARY  Visits from Start of Care: 4  Current functional level related to goals / functional outcomes: See above   Remaining deficits: See above   Education / Equipment: HEP  Plan: Patient agrees to discharge.  Patient goals were met. Patient is being discharged due to meeting the stated rehab goals.  ?????     Hilda Blades, PT, DPT, LAT, ATC 07/11/20  12:19 PM Phone: [phone] Fax: [facility] [address] [place], [place], [address] Phone: [phone]  Fax:  [phone]  Name: Chelsea [name] MRN: [id number] Date of Birth: [birth year]

## 2020-07-22 ENCOUNTER — Ambulatory Visit (HOSPITAL_COMMUNITY): Payer: Medicare Other | Attending: Cardiology

## 2020-07-22 ENCOUNTER — Other Ambulatory Visit: Payer: Self-pay

## 2020-07-22 DIAGNOSIS — I351 Nonrheumatic aortic (valve) insufficiency: Secondary | ICD-10-CM

## 2020-07-22 DIAGNOSIS — I719 Aortic aneurysm of unspecified site, without rupture: Secondary | ICD-10-CM | POA: Diagnosis not present

## 2020-07-22 LAB — ECHOCARDIOGRAM COMPLETE
AR max vel: 1.73 cm2
AV Area VTI: 1.81 cm2
AV Area mean vel: 2 cm2
AV Mean grad: 11 mmHg
AV Peak grad: 20.4 mmHg
Ao pk vel: 2.26 m/s
Area-P 1/2: 2.71 cm2
P 1/2 time: 440 msec
S' Lateral: 3.6 cm

## 2020-07-28 ENCOUNTER — Institutional Professional Consult (permissible substitution): Payer: Medicare Other | Admitting: Neurology

## 2020-07-28 DIAGNOSIS — M545 Low back pain: Secondary | ICD-10-CM | POA: Diagnosis not present

## 2020-07-28 DIAGNOSIS — M4316 Spondylolisthesis, lumbar region: Secondary | ICD-10-CM | POA: Diagnosis not present

## 2020-07-29 DIAGNOSIS — I69952 Hemiplegia and hemiparesis following unspecified cerebrovascular disease affecting left dominant side: Secondary | ICD-10-CM | POA: Diagnosis not present

## 2020-07-29 DIAGNOSIS — E785 Hyperlipidemia, unspecified: Secondary | ICD-10-CM | POA: Diagnosis not present

## 2020-07-29 DIAGNOSIS — I10 Essential (primary) hypertension: Secondary | ICD-10-CM | POA: Diagnosis not present

## 2020-08-07 NOTE — Progress Notes (Signed)
Cardiology Office Note:    Date:  08/07/2020   ID:  Chelsea [name], DOB [birth year], MRN [id number]  PCP:  Trey Sailors, PA  Cardiologist:  Sinclair Grooms, MD   Referring MD: Trey Sailors, [place]   No chief complaint on file.   History of Present Illness:    Chelsea [name] is a 69 y.o. female with a hx of mild aortic regurgitation, non-cardiac chest pain, prior history of stroke with residual left-sided weakness, hyperlipidemia, hypertension, and moderate obesity.  She feels well.  She has had several episodes of fainting related to hot weather.  Air conditioning is not working in her house.  She develops diaphoresis, nausea, and then will faint.  No chest pain or palpitations associated.  If she is able to lie down these episodes resolve.  She denies chest pain, orthopnea, PND, claudication, and lower extremity swelling.  Past Medical History:  Diagnosis Date  . Anxiety   . Cerebrovascular accident [facility])    a. 2007-->residual left sided weakness  . Chest pain   . Depression   . GERD (gastroesophageal reflux disease)   . Headache   . Hx of hysterectomy   . Hyperlipidemia   . Hypertension   . Mild to Moderate Aortic Insufficiency    a. 08/2014 Echo: EF 55-60%, mild conc LVH, no rwma, Gr 1 DD, mild-mod AI, mild MR.  . Morbid obesity ([place])   . Obesity   . Peptic ulcer disease     Past Surgical History:  Procedure Laterality Date  . ABDOMINAL HYSTERECTOMY    . 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 TUBAL LIGATION      Current Medications: No outpatient medications have been marked as taking for the 08/08/20 encounter (Appointment) with Belva Crome, MD.     Allergies:   Other and Hydrocodone-acetaminophen   Social History   Socioeconomic History  . Marital status: Married    Spouse name: Not on file  . Number of  children: 3  . Years of education: 72  . Highest education level: Not on file  Occupational History  . 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 Smoking status: Current Every Day Smoker  . Smokeless tobacco: Never Used  Substance and Sexual Activity  . Alcohol use: No    Alcohol/week: 0.0 standard drinks  . Drug use: No  . Sexual activity: Not Currently  Other Topics Concern  . Not on file  Social History Narrative   Patient consumes no caffeine   Social Determinants of Health   Financial Resource Strain:   . Difficulty of Paying Living Expenses: Not on file  Food Insecurity:   . Worried About Charity fundraiser in the Last Year: Not on file  . Ran Out of Food in the Last Year: Not on file  Transportation Needs:   . Lack of Transportation (Medical): Not on file  . Lack of Transportation (Non-Medical): Not on file  Physical Activity:   . Days of Exercise per Week: Not on file  . Minutes of Exercise per Session: Not on file  Stress:   . Feeling of Stress : Not on file  Social Connections:   . Frequency of Communication with Friends and Family: Not on file  . Frequency of Social Gatherings with  Friends and Family: Not on file  . Attends Religious Services: Not on file  . Active Member of Clubs or Organizations: Not on file  . Attends Archivist Meetings: Not on file  . Marital Status: Not on file     Family History: The patient's family history includes Cancer in her brother; Cirrhosis in her father; Drug abuse in her brother; Heart attack in an other family member; Hypertension in her mother; Peripheral vascular disease in her mother. There is no history of Breast cancer.  ROS:   Please see the history of present illness.    She is very disappointed that she has no conditioning in her apartment.  She is being told that it will be fixed but she is been in a hot apartment space most of the summer.   She has a small floor unit in her living room she spends most of the time.  All other systems reviewed and are negative.  EKGs/Labs/Other Studies Reviewed:    The following studies were reviewed today: 2D Doppler echocardiogram August 2021: IMPRESSIONS    1. Left ventricular ejection fraction, by estimation, is 60 to 65%. The  left ventricle has normal function. The left ventricle has no regional  wall motion abnormalities. There is mild left ventricular hypertrophy.  Left ventricular diastolic parameters  are consistent with Grade I diastolic dysfunction (impaired relaxation).  2. Right ventricular systolic function is normal. The right ventricular  size is normal. There is normal pulmonary artery systolic pressure. The  estimated right ventricular systolic pressure is 23.5 mmHg.  3. Left atrial size was moderately dilated.  4. The mitral valve is normal in structure. Trivial mitral valve  regurgitation. No evidence of mitral stenosis.  5. The aortic valve is tricuspid. Aortic valve regurgitation is mild.  Aortic valve area, by VTI measures 1.81 cm. Aortic valve mean gradient  measures 11.0 mmHg. Visually, the aortic valve appears to open well.  Possible elevated mean gradient due to  high flow rather than mild aortic stenosis.  6. Aortic dilatation noted. There is mild dilatation of the ascending  aorta measuring 44 mm.  7. The inferior vena cava is normal in size with greater than 50%  respiratory variability, suggesting right atrial pressure of 3 mmHg.    CT scan chest March 2021: IMPRESSION: 1. Borderline aneurysmal dilatation of the ascending thoracic aorta, measuring 4.0 cm in maximum diameter. Recommend annual imaging followup by CTA or MRA. This recommendation follows 2010 ACCF/AHA/AATS/ACR/ASA/SCA/SCAI/SIR/STS/SVM Guidelines for the Diagnosis and Management of Patients with Thoracic Aortic Disease. Circulation. 2010; 121: T614-E315. Aortic aneurysm NOS  (ICD10-I71.9) 2. Cardiomegaly and minimal pericardial effusion. 3. Cholelithiasis. 4. Interval 1.8 cm cystic appearing right thyroid nodule with a small amount of peripheral soft tissue nodularity. Recommend thyroid US (ref: J Am Coll Radiol. 2015 Feb;12(2): 143-50).    EKG:  EKG is abnormal with interventricular conduction delay, sinus rhythm, left axis deviation, and poor R wave progression V1 through V4.  The most recent EKG completed August 2020 shows no significant abnormality.  Recent Labs: 01/22/2020: BUN 24; Creatinine, Ser 1.27; Potassium 4.2; Sodium 144  Recent Lipid Panel    Component Value Date/Time   CHOL 134 04/20/2016 1409   TRIG 99 04/20/2016 1409   HDL 54 04/20/2016 1409   CHOLHDL 2.5 04/20/2016 1409   VLDL 20 04/20/2016 1409   LDLCALC 60 04/20/2016 1409    Physical Exam:    VS:  There were no vitals taken for this visit.  Wt Readings from Last 3 Encounters:  10/16/19 202 lb 6.4 oz (91.8 kg)  09/13/19 200 lb (90.7 kg)  08/24/19 199 lb (90.3 kg)     GEN: Overweight. No acute distress HEENT: Normal NECK: No JVD. LYMPHATICS: No lymphadenopathy CARDIAC: 2/6 crescendo decrescendo left upper sternal and right upper sternal border systolic RRR with soft scratchy diastolic murmur, no gallop gallop, or edema. VASCULAR:  Normal Pulses. No bruits. RESPIRATORY:  Clear to auscultation without rales, wheezing or rhonchi  ABDOMEN: Soft, non-tender, non-distended, No pulsatile mass, MUSCULOSKELETAL: No deformity  SKIN: Warm and dry NEUROLOGIC:  Alert and oriented x 3 PSYCHIATRIC:  Normal affect   ASSESSMENT:    1. Nonrheumatic aortic valve insufficiency   2. Aortic aneurysm without rupture, unspecified portion of aorta ([place])   3. Essential hypertension   4. Other hyperlipidemia   5. Morbid obesity ([place])   6. Educated about COVID-19 virus infection    PLAN:    In order of problems listed above:  1. Mild by echo.  Not audible by auscultation 2. Add  Toprol-XL 50 mg/day to better control blood pressure and decrease risk of aneurysm expansion.  Follow-up in blood pressure clinic in 2 to 4 weeks.  Continue Exforge, furosemide, Catapres. 3. As above.  Follow-up in blood pressure clinic in 1 month. 4. Continue Lipitor 10 mg/day.  LDL target less than 70. 5. Encouraged to exercise 6. She has been vaccinated and is practicing social distancing   Medication Adjustments/Labs and Tests Ordered: Current medicines are reviewed at length with the patient today.  Concerns regarding medicines are outlined above.  No orders of the defined types were placed in this encounter.  No orders of the defined types were placed in this encounter.   There are no Patient Instructions on file for this visit.   Signed, Sinclair Grooms, MD  08/07/2020 6:34 PM    [place]

## 2020-08-08 ENCOUNTER — Ambulatory Visit (INDEPENDENT_AMBULATORY_CARE_PROVIDER_SITE_OTHER): Payer: Medicare Other | Admitting: Interventional Cardiology

## 2020-08-08 ENCOUNTER — Other Ambulatory Visit: Payer: Self-pay

## 2020-08-08 ENCOUNTER — Encounter: Payer: Self-pay | Admitting: Interventional Cardiology

## 2020-08-08 VITALS — BP 154/96 | HR 79 | Ht 66.0 in | Wt 205.4 lb

## 2020-08-08 DIAGNOSIS — I351 Nonrheumatic aortic (valve) insufficiency: Secondary | ICD-10-CM | POA: Diagnosis not present

## 2020-08-08 DIAGNOSIS — Z7189 Other specified counseling: Secondary | ICD-10-CM

## 2020-08-08 DIAGNOSIS — E7849 Other hyperlipidemia: Secondary | ICD-10-CM | POA: Diagnosis not present

## 2020-08-08 DIAGNOSIS — I10 Essential (primary) hypertension: Secondary | ICD-10-CM

## 2020-08-08 DIAGNOSIS — I719 Aortic aneurysm of unspecified site, without rupture: Secondary | ICD-10-CM

## 2020-08-08 MED ORDER — METOPROLOL SUCCINATE ER 50 MG PO TB24: 50.0000 mg | ORAL_TABLET | Freq: Every day | ORAL | 3 refills | Status: DC

## 2020-08-08 NOTE — Patient Instructions (Signed)
Medication Instructions:  1) START Metoprolol Succinate 50mg  once daily  *If you need a refill on your cardiac medications before your next appointment, please call your pharmacy*   Lab Work: None If you have labs (blood work) drawn today and your tests are completely normal, you will receive your results only by: Marland Kitchen MyChart Message (if you have MyChart) OR . A paper copy in the mail If you have any lab test that is abnormal or we need to change your treatment, we will call you to review the results.   Testing/Procedures: None   Follow-Up:  Your physician recommends that you schedule a follow-up appointment in: 2-4 weeks with our Hypertension Clinic.   At [facility], you and your health needs are our priority.  As part of our continuing mission to provide you with exceptional heart care, we have created designated Provider Care Teams.  These Care Teams include your primary Cardiologist (physician) and Advanced Practice Providers (APPs -  Physician Assistants and Nurse Practitioners) who all work together to provide you with the care you need, when you need it.  We recommend signing up for the patient portal called "MyChart".  Sign up information is provided on this After Visit Summary.  MyChart is used to connect with patients for Virtual Visits (Telemedicine).  Patients are able to view lab/test results, encounter notes, upcoming appointments, etc.  Non-urgent messages can be sent to your provider as well.   To learn more about what you can do with MyChart, go to [email or web address].    Your next appointment:   12 month(s)  The format for your next appointment:   In Person  Provider:   You may see Sinclair Grooms, MD or one of the following Advanced Practice Providers on your designated Care Team:    Truitt Merle, NP  Cecilie Kicks, NP  Kathyrn Drown, NP    Other Instructions

## 2020-08-11 ENCOUNTER — Ambulatory Visit (INDEPENDENT_AMBULATORY_CARE_PROVIDER_SITE_OTHER): Payer: Medicare Other | Admitting: Neurology

## 2020-08-11 ENCOUNTER — Encounter: Payer: Self-pay | Admitting: Neurology

## 2020-08-11 ENCOUNTER — Telehealth: Payer: Self-pay | Admitting: Neurology

## 2020-08-11 VITALS — BP 142/86 | HR 69 | Ht 64.0 in | Wt 204.0 lb

## 2020-08-11 DIAGNOSIS — R279 Unspecified lack of coordination: Secondary | ICD-10-CM

## 2020-08-11 DIAGNOSIS — I619 Nontraumatic intracerebral hemorrhage, unspecified: Secondary | ICD-10-CM | POA: Diagnosis not present

## 2020-08-11 DIAGNOSIS — I69398 Other sequelae of cerebral infarction: Secondary | ICD-10-CM

## 2020-08-11 DIAGNOSIS — I69354 Hemiplegia and hemiparesis following cerebral infarction affecting left non-dominant side: Secondary | ICD-10-CM

## 2020-08-11 DIAGNOSIS — G890 Central pain syndrome: Secondary | ICD-10-CM | POA: Diagnosis not present

## 2020-08-11 MED ORDER — GABAPENTIN 800 MG PO TABS: 800.0000 mg | ORAL_TABLET | Freq: Two times a day (BID) | ORAL | 3 refills | Status: DC

## 2020-08-11 NOTE — Telephone Encounter (Signed)
[employer] medicare/medicaid order sent to GI. No auth they will reach out to the patient to schedule.  °

## 2020-08-11 NOTE — Progress Notes (Addendum)
Subjective:    Patient ID: Chelsea [name] is a 69 y.o. female.  HPI     Interim history:   Chelsea [name] is a 69 year old right-handed woman with an underlying complex medical history of hypertension, reflux disease, hyperactive bladder, arthritis with s/p L TKA in 2/16, hyperlipidemia, depression, and previous stroke in 2007 with residual left-sided weakness, and thalamic pain syndrome, aortic insufficiency, followed by cardiology, who presents for re-evaluation of her left-sided weakness and thalamic pain syndrome, history of right sided thalamic stroke.  The patient is unaccompanied today. She is referred for re-evaluation by her primary care PA, Raelyn Number. I last saw her on 10/10/2017, at which time she was on gabapentin 400 mg strength, 2 pills daily.  She did report increase in stress.  She is currently referred by her primary care PA, Raelyn Number.    The patient saw Vaughan Browner, nurse practitioner in our clinic on 04/11/2018, at which time she was stable and maintained gabapentin for pain management, 800 mg bid.  She saw Vaughan Browner on 10/12/2018, at which time she was stable and advised to follow-up in 1 year.  She was seen by Ward Givens on 10/16/2019, at which time she was stable on gabapentin 800 mg bid, and advised to follow-up routinely with the primary care so long as she could get her prescription through them or follow-up with [place] in 1 year.  Today, 08/11/2020: She reports that her weakness has been stable on the left side since 2007.  She does report that when she is stressed out or upset, she feels that her numbness gets worse on the left side.  She continues to take gabapentin 800 mg twice daily for discomfort on the left side.  She has not been using her ankle brace on the left, she may have misplaced it but will look for it.  She is trying to quit smoking but continues to smoke approximately a pack per day currently.  She has seen cardiology and has been started on  metoprolol.  She was told she had a aortic aneurysm that needs to be monitored.  She may need surgery eventually for this.  She saw Dr. Tamala Julian in cardiology on 08/08/2020 and I reviewed the note.   She had a brain MRI w/wo contrast on 11/19/08 and I reviewed the results:  IMPRESSION:  1.  No evidence of acute ischemia.  2.  Stable non specific supratentorial subcortical white matter  changes most likely representing areas of ischemic gliosis due to  small vessel disease related to hypertension or diabetes.  3.  Interval near-complete resolution of the previous right  thalamic hemorrhage.  4.  Small focal area of enhancement within the epicenter of the  hemorrhage, which probably represents contrast pooling.  No  evidence of a vascular abnormality however is suggested on the MRI  examination.  She had a brain MRI with and without contrast, MRA head without contrast and MRA neck with and without contrast through [facility] on 10/03/2009 and I reviewed the results:  Conclusion:  Linear area of postcontrast enhancement in the right thalamus more  likely represents granulation tissue from prior hemorrhage than a  cavernoma given its appearance. The peripheral area of low T2 signal  intensity is most consistent with hemosiderin deposition of remote  blood products.  If outside MRI is available for comparison this  would be helpful to more accurately characterize the chronicity of  this process.   Slight decrease perfusion in the left watershed supratentorial  brain  without stenosis identified in the arterial circulation of the neck  or brain. This may be secondary to asymmetric artifact in the study  technique.   Scattered foci of T2 signal hyperintensity in the supratentorial  white matter greater on the right than left which are nonspecific but  most likely related to chronic small vessel ischemic disease.   No acute infarct.   Unremarkable MRA of the intracranial and cervical vessels.      The patient's allergies, current medications, family history, past medical history, past social history, past surgical history and problem list were reviewed and updated as appropriate.      Previously:  I saw her on 09/09/2016, at which time she reported doing okay as far as the pain on the L side. She was seen by Dr. Aundra Dubin in cardiology on 04/20/2016. For her LBP, she had seen Dr. Jacelyn Grip and had received injections. L knee was okay, finished PT.   She saw Ward Givens in the interim on 03/10/2017, at which time she reported residual pain in her gabapentin was increased to 400 mg 3 times a day.     I saw her on 02/11/2016, at which time she reported that gabapentin was helping. She did have residual pain on the left side. She was noted to have an irregular pulse on my exam and denied any chest pain or shortness of breath, but reported that she herself had noted irregular heartbeat from time to time. She had not seen cardiology since 2014. She had an echocardiogram and EKG for surgical clearance recently.she underwent left total knee arthroplasty on 01/05/2016, under Dr. Mayer Camel. She was coming along quite well after that, still was in physical therapy. Unfortunately, her pain medication had caused severe nausea and vomiting. She stopped taking narcotic pain medication in February 2017. I suggested she continue with gabapentin 300 mg 3 times a day.     I saw her on 08/14/2015, at which time she reported doing fairly well. She requested a letter so she can have help with her trash can as she was not able to move her trash can by herself. She felt that the increase in gabapentin was helpful. She was not driving. She had ongoing left-sided weakness and left-sided involuntary movements but stable. She had gained some weight and was scheduled to see a nutritionist. I suggested she continue with gabapentin 300 mg 3 times a day.   I saw her on 03/10/2015, at which time she reported that the gabapentin  was somewhat helpful. She had some residual pain. She was taking 300 mg each night. We gradually increased it to tid.    I first met her on 11/06/2014 at the request of her primary care physician, at which time the patient reported a several year history of left-sided pain in her face as well as hemibody. Symptoms started perhaps a few years after her stroke in 2007. I suggested symptomatic treatment with gabapentin. 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 I suggested she titrate gabapentin starting at 100 mg at night.   She reports left-sided pain in her face, as well as the left hemibody. Symptoms started gradually, but she does not know exactly when they started. It did not happen immediately after the stroke in 2007, perhaps a few years later. She has residual left-sided weakness and walks with a cane. She had therapy and home health therapy and was advised to  use a cane. She describes a warmth sensation on the left side of her face and hemibody. This can be a burning pain as well at times. It is not always the same or constant. She has a tremor on the left side and difficulty with fine motor control in the left. In addition she has neck and lower back pain. She has seen a chiropractor for this. She had seen [facility] for this and her knee in the past. She's not able to pinpoint the degree of pain on the left side. Sometimes it is not actually a pain but an abnormal temperature sensation. It is not necessarily debilitating but at times quite painful when it has the burning sensation to it. She lives alone. She has 3 grown children. Her grandson is currently visiting. She does not smoke. She is on a baby aspirin. Of note, she had a right thalamic hemorrhagic stroke. She had an MRI brain with and without contrast on 11/20/2008: 1.  No evidence of acute ischemia. 2.  Stable non specific  supratentorial subcortical white matter changes most likely representing areas of ischemic gliosis due to small vessel disease related to hypertension or diabetes. 3.  Interval near-complete resolution of the previous right thalamic hemorrhage. 4.  Small focal area of enhancement within the epicenter of the hemorrhage, which probably represents contrast pooling.  No evidence of a vascular abnormality however is suggested on the MRI examination.   She had a brain MRI with and without contrast and brain MRA without contrast on 04/22/2006:1.  Right thalamic hematoma with surrounding mass effect secondary to vasogenic edema. 2.  Mild mass effect on the inferior aspect of the third ventricle. 3.  No imaging evidence of abnormal enhancement or blood vessel ending from periphery of this hemorrhage.   4.  Mild sinusitis changes.   1.  No evidence of occlusion, stenosis, dissections or brain aneurysms noted on the images provided. 2.  Aneurysms of 5 mm or less may not be seen on MRA examination.     I personally reviewed the images through the PACS system and also shared images with her on the computer.   Of note, she is not sure if she snores. She denies gasping sensations and has not been told that she has pauses in her breathing.  Her Past Medical History Is Significant For: Past Medical History:  Diagnosis Date   Anxiety    Cerebrovascular accident [facility])    a. 2007-->residual left sided weakness   Chest pain    Depression    GERD (gastroesophageal reflux disease)    Headache    Hx of hysterectomy    Hyperlipidemia    Hypertension    Mild to Moderate Aortic Insufficiency    a. 08/2014 Echo: EF 55-60%, mild conc LVH, no rwma, Gr 1 DD, mild-mod AI, mild MR.   Morbid obesity (House)    Obesity    Peptic ulcer disease     Her Past Surgical History Is Significant For: Past Surgical History:  Procedure Laterality Date   ABDOMINAL HYSTERECTOMY     TOTAL KNEE ARTHROPLASTY Left  01/05/2016   Procedure: TOTAL KNEE ARTHROPLASTY;  Surgeon: Frederik Pear, MD;  Location: [facility] [place];  Service: Orthopedics;  Laterality: Left;   TUBAL LIGATION      Her Family History Is Significant For: Family History  Problem Relation Age of Onset   Hypertension Mother        deceased   Peripheral vascular disease Mother    Cirrhosis Father  DeCEASED   Heart attack Other        grandmother deceased   Cancer Brother    Drug abuse Brother        overdose, in 1 brother   Breast cancer Neg Hx     Her Social History Is Significant For: Social History   Socioeconomic History   Marital status: Married    Spouse name: Not on file   Number of children: 3   Years of education: 11   Highest education level: Not on file  Occupational History   Occupation: child care    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  Tobacco Use   Smoking status: Current Every Day Smoker   Smokeless tobacco: Never Used  Substance and Sexual Activity   Alcohol use: No    Alcohol/week: 0.0 standard drinks   Drug use: No   Sexual activity: Not Currently  Other Topics Concern   Not on file  Social History Narrative   Patient consumes no caffeine   Social Determinants of Radio broadcast assistant Strain:    Difficulty of Paying Living Expenses: Not on file  Food Insecurity:    Worried About Charity fundraiser in the Last Year: Not on file   [employer] of Food in the Last Year: Not on file  Transportation Needs:    Lack of Transportation (Medical): Not on file   Lack of Transportation (Non-Medical): Not on file  Physical Activity:    Days of Exercise per Week: Not on file   Minutes of Exercise per Session: Not on file  Stress:    Feeling of Stress : Not on file  Social Connections:    Frequency of Communication with Friends and Family: Not on file   Frequency of Social Gatherings with Friends and Family: Not on file   Attends Religious  Services: Not on file   Active Member of Clubs or Organizations: Not on file   Attends Archivist Meetings: Not on file   Marital Status: Not on file    Her Allergies Are:  Allergies  Allergen Reactions   Other Swelling    Hair dye, caused eye swelling, multiple times experienced this reaction   Hydrocodone-Acetaminophen Nausea Only   Oxycodone Nausea And Vomiting  :   Her Current Medications Are:  Outpatient Encounter Medications as of 08/11/2020  Medication Sig   amLODipine-valsartan (EXFORGE) 10-320 MG tablet Take 1 tablet by mouth daily.   aspirin EC 81 MG tablet Take 1 tablet (81 mg total) by mouth daily.   atorvastatin (LIPITOR) 10 MG tablet TAKE 1 TABLET BY MOUTH  DAILY   chlorhexidine (PERIDEX) 0.12 % solution 15 mLs 2 (two) times daily.   citalopram (CELEXA) 10 MG tablet Take 20 mg by mouth daily.    cloNIDine (CATAPRES) 0.3 MG tablet Take 1 tablet (0.3 mg total) by mouth 2 (two) times daily.   cyanocobalamin 100 MCG tablet Take 100 mcg by mouth daily.    cyclobenzaprine (FLEXERIL) 10 MG tablet Take 10 mg by mouth 3 (three) times daily as needed for muscle spasms.   doxazosin (CARDURA) 8 MG tablet Take 8 mg by mouth at bedtime.   ezetimibe (ZETIA) 10 MG tablet Take 10 mg by mouth daily.   furosemide (LASIX) 20 MG tablet Take 20 mg by mouth 2 (two) times daily.    gabapentin (NEURONTIN) 800 MG tablet TAKE 1 TABLET BY MOUTH  TWICE DAILY   indapamide (LOZOL) 2.5 MG tablet  Take 2.5 mg by mouth daily.   meclizine (ANTIVERT) 25 MG tablet Take 25 mg by mouth 3 (three) times daily as needed.   meloxicam (MOBIC) 15 MG tablet Take 15 mg by mouth daily.   methocarbamol (ROBAXIN) 500 MG tablet Take 500-1,000 mg by mouth 3 (three) times daily as needed.   metoprolol succinate (TOPROL-XL) 50 MG 24 hr tablet Take 1 tablet (50 mg total) by mouth daily. Take with or immediately following a meal.   mirabegron ER (MYRBETRIQ) 25 MG TB24 tablet Take 25 mg by  mouth daily.   Multiple Vitamin (MULTIVITAMIN) tablet Take 1 tablet by mouth daily.     pantoprazole (PROTONIX) 40 MG tablet Take 40 mg by mouth 2 (two) times daily.    potassium chloride (KLOR-CON) 10 MEQ tablet Take 10 mEq by mouth daily.   tiZANidine (ZANAFLEX) 4 MG tablet Take 8 mg by mouth 3 (three) times daily as needed.   traMADol (ULTRAM) 50 MG tablet Take 50 mg by mouth every 8 (eight) hours as needed.   nitroGLYCERIN (NITROSTAT) 0.4 MG SL tablet Place 1 tablet (0.4 mg total) under the tongue every 5 (five) minutes as needed for chest pain.   No facility-administered encounter medications on file as of 08/11/2020.  :  Review of Systems:  Out of a complete 14 point review of systems, all are reviewed and negative with the exception of these symptoms as listed below: Review of Systems  Neurological:       Pt reports here to discuss worsening left handed weakness/numbness. Pt reports these sx have been present since her last stroke. Pt reports her stroke was in 2007.    Objective:  Neurological Exam  Physical Exam Physical Examination:   Vitals:   08/11/20 0948  BP: (!) 142/86  Pulse: 69  SpO2: 97%    General Examination: The patient is a very pleasant 69 y.o. female in no acute distress. She appears well-developed and well-nourished and well groomed.   HEENT:Normocephalic, atraumatic, pupils are equal, round and reactive to light and accommodation. She wears corrective eyeglasses, extraocular tracking is preserved, no nystagmus seen, hearing is grossly intact.  Face is perhaps slightly asymmetric with mild left lower facial nasolabial fold effacement but appears to be stable.  She does have decreased sensation to all modalities on the left hemiface.  Airway examination reveals mild to moderate mouth dryness, otherwise stable findings, tongue protrudes centrally and palate elevates symmetrically.    Chest:Clear to auscultation without wheezing, rhonchi or crackles  noted.  Heart:S1+S2+0, regular,nomurmur.  Abdomen:Soft, non-tender and non-distended with normal bowel sounds appreciated on auscultation.  Extremities:There is no pitting edema in the distal lower extremities bilaterally.   Skin: Warm and dry without trophic changes noted. There are no varicose veins.  Musculoskeletal: exam reveals no obvious joint deformities.   Neurologically:  Mental status: The patient is awake, alert and oriented in all 4 spheres. Her immediate and remote memory, attention, language skills and fund of knowledge are appropriate. There is no evidence of aphasia, agnosia, apraxia or anomia. Speech is clear with normal prosody and enunciation. Thought process is linear. Mood is normal and affect is normal.  Cranial nerves II - XII are as described above under HEENT exam.  Motor exam: She has an overall fairly thin bulk. Strength is normal on the right. She has 4 out of 5 weakness on the left, stable. She has mild involuntary movements at rest in the left upper extremity, and a coarse intention tremor on the  left, no significant postural tremor in either upper extremity. She has difficulty with fine motor skills and has involuntary movements in the left upper extremity in particular with action. All unchanged. Sensory exam shows decreased all modalities on the left hemibody. Reflexes are 1+ throughout.  Toes are downgoing bilaterally.  Mild left foot drop.  She does have active mobility in the ankle.  She stands with difficulty, she has a four-point cane.  Her left ankle is inverted and she does not have a brace on today.  She walks with mild circumduction.  On cerebellar testing: Finger-to-nose is unremarkable on the right.  She has coarse difficulty with the left.  Heel-to-shin is normal on the right, mild difficulty with the left. Tandem walk is not possible. Romberg is not possible.  Assessment and Plan:   In summary, AYRIS CARANO is a very pleasant 39-year  old female with an underlying complex medical history of hypertension, reflux disease, hyperactive bladder, arthritis, hyperlipidemia, depression, and stroke in 2007 withresidualleft-sided weakness, aortic insufficiency(seen byDr. Aundra Dubin incardiology),  aortic aneurysm, obesity, status post left total knee replacement in 2016, who presents for follow-up consultation of her thalamic pain syndrome, and reevaluation of her left-sided weakness as requested by her primary care PA.  She has stable findings on examination today.  She does not report any new weakness but does have some fluctuation in her numbness especially when stressed out.  She has had good success with gabapentin to reduce discomfort on the left side in the context of thalamic stroke with thalamic pain syndrome.  She does have incoordination on the left side.  We mutually agreed to reevaluate her with a brain MRI with and without contrast.  She had blood work through primary care in July which did indicate impaired kidney function.  We will repeat a CMP today.  Her BUN was 33 and creatinine was 1.92 on 06/07/2020.  If she has impaired kidney function I will have to change the brain MRI requested without contrast only.  We will call to schedule her MRI scans.  Furthermore, I will make a referral to physical therapy.  She is advised to use her ankle brace for better support in her left foot.  She needed a refill on her gabapentin which I provided today.  She is strongly advised to continue to work on smoking cessation.  She continues to medication for her blood pressure and cholesterol management and she takes a daily baby aspirin.  We will plan to see her in clinic routinely in 1 year.  We will keep her posted as to her MRI results by phone call.  If we need to see her sooner, we will move up her appointment and she is encouraged to call for any interim questions or concerns.  I placed a referral for physical therapy at neuro rehab today. I answered  all her questions today and she was in agreement. She is advised to follow-up with Ward Givens, nurse practitioner in 1 year.  I spent 35 minutes in total face-to-face time and in reviewing records during pre-charting, more than 50% of which was spent in counseling and coordination of care, reviewing test results, reviewing medications and treatment regimen and/or in discussing or reviewing the diagnosis of thalamic stroke, residual weakness, the prognosis and treatment options. Pertinent laboratory and imaging test results that were available during this visit with the patient were reviewed by me and considered in my medical decision making (see chart for details).    Addendum, 10/15/2020:  I had placed a referral to neuro rehab for evaluation. I would recommend and support that the patient can get a new AFO to improve mechanics and safety in gait to reduce and prevent falls.

## 2020-08-11 NOTE — Patient Instructions (Signed)
Your exam appears to be stable.  Nevertheless, we can reevaluate with a brain MRI and compare with previous findings, we will also proceed with an MR angiogram of the head.  We will request insurance authorization and call you to schedule your scans.  In preparation for the brain MRI with and without contrast, we will check your kidney function, you had impaired kidney function in July, if you still have impaired kidney function I may have to change your MRI request to a noncontrasted brain MRI only.  I will make a referral to physical therapy to see if they can work on your left sided weakness.  For your abnormal ankle position on the left side, I think it is really important that you use your brace.  We can continue with gabapentin 800 mg twice daily.  I renewed your prescription.  Please follow-up routinely in our office to see Chelsea [name], nurse practitioner in 1 year.

## 2020-08-12 ENCOUNTER — Telehealth: Payer: Self-pay | Admitting: Neurology

## 2020-08-12 LAB — COMPREHENSIVE METABOLIC PANEL
ALT: 16 IU/L (ref 0–32)
AST: 18 IU/L (ref 0–40)
Albumin/Globulin Ratio: 1.3 (ref 1.2–2.2)
Albumin: 3.8 g/dL (ref 3.8–4.8)
Alkaline Phosphatase: 104 IU/L (ref 44–121)
BUN/Creatinine Ratio: 19 (ref 12–28)
BUN: 22 mg/dL (ref 8–27)
Bilirubin Total: 0.4 mg/dL (ref 0.0–1.2)
CO2: 31 mmol/L — ABNORMAL HIGH (ref 20–29)
Calcium: 9.2 mg/dL (ref 8.7–10.3)
Chloride: 97 mmol/L (ref 96–106)
Creatinine, Ser: 1.17 mg/dL — ABNORMAL HIGH (ref 0.57–1.00)
GFR calc Af Amer: 55 mL/min/{1.73_m2} — ABNORMAL LOW (ref >59)
GFR calc non Af Amer: 48 mL/min/{1.73_m2} — ABNORMAL LOW (ref >59)
Globulin, Total: 3 g/dL (ref 1.5–4.5)
Glucose: 86 mg/dL (ref 65–99)
Potassium: 3 mmol/L — ABNORMAL LOW (ref 3.5–5.2)
Sodium: 142 mmol/L (ref 134–144)
Total Protein: 6.8 g/dL (ref 6.0–8.5)

## 2020-08-12 NOTE — Telephone Encounter (Signed)
Please call patient, laboratory evaluation showed mild elevated creatinine 1.17, with estimated GFR of 48, mildly decreased, this is at his baseline, I also forward the lab results to his primary care Trey Sailors, [place]

## 2020-08-13 NOTE — Addendum Note (Signed)
Addended by: Star Age on: 08/13/2020 07:00 AM   Modules accepted: Orders

## 2020-08-13 NOTE — Telephone Encounter (Signed)
Pt called wanting to speak to RN so that her lab results can be explained to her. Please advise.

## 2020-08-13 NOTE — Telephone Encounter (Signed)
She has had elevated creatinine levels, higher than current. Please advise patient, that I would suggest to do the MRI brain wo contrast only, to be safe. I will change the order.

## 2020-08-13 NOTE — Telephone Encounter (Signed)
I called the pt and left a vm advising pt of results and the change of MRI w/o contrast. Pt advised to call back if she had any questions/ concerns.

## 2020-08-14 NOTE — Telephone Encounter (Signed)
I reached out to the pt and we discussed her labs and that Dr. Rexene Alberts would be changing the order for the MRI to w/o contrast only.  Pt verbalized understanding and had no other questions at this time.

## 2020-08-21 ENCOUNTER — Ambulatory Visit
Admission: RE | Admit: 2020-08-21 | Discharge: 2020-08-21 | Disposition: A | Discharge status: Home / Self Care | Payer: Medicare Other | Source: Ambulatory Visit | Attending: Neurology | Admitting: Neurology

## 2020-08-21 DIAGNOSIS — I619 Nontraumatic intracerebral hemorrhage, unspecified: Secondary | ICD-10-CM | POA: Diagnosis not present

## 2020-08-21 DIAGNOSIS — G890 Central pain syndrome: Secondary | ICD-10-CM

## 2020-08-21 DIAGNOSIS — I69354 Hemiplegia and hemiparesis following cerebral infarction affecting left non-dominant side: Secondary | ICD-10-CM

## 2020-08-21 DIAGNOSIS — I69398 Other sequelae of cerebral infarction: Secondary | ICD-10-CM

## 2020-08-21 NOTE — Progress Notes (Signed)
Please call patient and advise him that her MR angiogram of the head showed no blood vessel abnormalities.  Her brain MRI showed stable findings with regards to the stroke in the past but also a tiny area of hemorrhage in the cerebellum, small brain.  No immediate action is required on these tests but she is reminded to have very strict blood pressure control, continue with cholesterol management and weight management.

## 2020-08-25 ENCOUNTER — Telehealth: Payer: Self-pay

## 2020-08-25 NOTE — Telephone Encounter (Signed)
-----   Message from Star Age, MD sent at 08/21/2020  5:11 PM EDT ----- Please call patient and advise him that her MR angiogram of the head showed no blood vessel abnormalities.  Her brain MRI showed stable findings with regards to the stroke in the past but also a tiny area of hemorrhage in the cerebellum, small brain.  No immediate action is required on these tests but she is reminded to have very strict blood pressure control, continue with cholesterol management and weight management.

## 2020-08-25 NOTE — Telephone Encounter (Signed)
I called the pt and advised I could not text her the results given over the phone. I advised I could send a my chart activaition code for her to set her account up and then she would have access.  Pt agreed to activation code being sent to the [phone] #.

## 2020-08-25 NOTE — Telephone Encounter (Signed)
I called the pt and advised of results. Pt verbalized understanding and had no questions/concerns on the result. Pt did report she has not heard on an appointment with the physical therapist yet. I looked into epic and did see where the order was sent on 08/11/2020. Will fwd message to referral coordinator for review.

## 2020-08-25 NOTE — Telephone Encounter (Signed)
Pt called, would like a call from the nurse before she goes home today.

## 2020-08-25 NOTE — Telephone Encounter (Signed)
Pt ask if results can be text to her phone. Would like a call from the nurse.

## 2020-08-27 NOTE — Telephone Encounter (Signed)
Pt returned call and was given phone number to schedule her therapy.

## 2020-08-27 NOTE — Telephone Encounter (Signed)
Called and left message telephone to schedule [phone]

## 2020-09-04 ENCOUNTER — Other Ambulatory Visit: Payer: Self-pay | Admitting: Interventional Cardiology

## 2020-09-05 DIAGNOSIS — K219 Gastro-esophageal reflux disease without esophagitis: Secondary | ICD-10-CM | POA: Diagnosis not present

## 2020-09-05 DIAGNOSIS — M62838 Other muscle spasm: Secondary | ICD-10-CM | POA: Diagnosis not present

## 2020-09-05 DIAGNOSIS — I10 Essential (primary) hypertension: Secondary | ICD-10-CM | POA: Diagnosis not present

## 2020-09-05 DIAGNOSIS — R600 Localized edema: Secondary | ICD-10-CM | POA: Diagnosis not present

## 2020-09-07 NOTE — Patient Instructions (Addendum)
It was nice to meet you today!   Your blood pressure goal is less than 130/63mmHg  INCREASE your metoprolol succinate to 100 mg once daily  DECREASE your clonidine using the following schedule: - Take 0.2 mg once at bedtime for four days - On 08/13/20 (Friday), take 0.1 mg once at bedtime for four days - On 08/17/20 STOP clonidine   Continue taking your amlodipine/valsartan (Exforge) 10/320 mg daily,  doxazosin 8 mg daily, furosemide 20 mg once daily, and indapamide 2.5 mg daily.    We will give you a call when the labs come back so that we can make any additional medication changes.   Please continue to monitor your blood pressure at home. Bring your readings and your home cuff to your next appointment.

## 2020-09-07 NOTE — Progress Notes (Signed)
Patient ID: Chelsea [name]                 DOB: February 12, 1951                      MRN: [id number]     HPI: Chelsea [name] is a 69 y.o. female referred by Dr. Tamala [name] to HTN clinic. PMH is significant for mild aortic regurgitation, non-cardiac chest pain, hx stroke and residual left-sided weakness, HLD, HTN, and moderate obesity. Pt was last seen by Dr. Tamala [name] on 08/08/20 where BP was elevated at 154/96 and HR 79. Pt was started on metoprolol succinate 50 mg/day.  Patient arrives in good spirits for initial visit. She reports that her BP is erratic at home with readings as high as 160/90-180/99 and SBP as low as 80-90s. She measures her BP prior to taking her morning medications. She is pleased with her symptom control since starting metoprolol - she states that she gets a HA when her BP is high in the morning but that this resolves once she takes metoprolol. She does not add salt to her food and has been trying to decrease her soda intake. 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   Current HTN meds: amlodipine/valsartan (Exforge) 10/320 mg daily, metoprolol succinate 50 mg daily, doxazosin 8 mg QHS, clonidine 0.3 mg QHS, furosemide 20 mg daily, indapamide 2.5 mg daily Previously tried: lisinopril 10 mg daily, bumetanide 1 mg daily BP goal: <130/80 mmHg  Family History: Cancer in her brother; Cirrhosis in her father; Drug abuse in her brother; Heart attack in an other family member; Hypertension in her mother; Peripheral vascular disease in her mother.  Social History: current smoker  Diet: Does not add salt to food and uses Mrs DASH. Enjoys pizza. Ate oatmeal for breakfast, PB&J sandwich for lunch, did not eat dinner last night given stomach ulcer pain. 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  Home BP readings: Fluctuates, measures  prior to taking morning BP meds - ranges 160/90-180/99, sometimes SBP down to 80-90s  Labs: 01/15/20: Scr 1.09, K 3.1, Na 145 01/22/20: Scr 1.27, K 4.2, Na 144 08/11/20: Scr 1.17, K 3.0, Na 142  Wt Readings from Last 3 Encounters:  08/11/20 204 lb (92.5 kg)  08/08/20 205 lb 6.4 oz (93.2 kg)  10/16/19 202 lb 6.4 oz (91.8 kg)   BP Readings from Last 3 Encounters:  08/11/20 (!) 142/86  08/08/20 (!) 154/96  10/16/19 (!) 192/97   Pulse Readings from Last 3 Encounters:  08/11/20 69  08/08/20 79  07/18/19 (!) 58    Renal function: CrCl cannot be calculated (Patient's most recent lab result is older than the maximum 21 days allowed.).  Past Medical History:  Diagnosis Date  . Anxiety   . Cerebrovascular accident [facility])    a. 2007-->residual left sided weakness  . Chest pain   . Depression   . GERD (gastroesophageal reflux disease)   . Headache   . Hx of hysterectomy   . Hyperlipidemia   . Hypertension   . Mild to Moderate Aortic Insufficiency    a. 08/2014 Echo: EF 55-60%, mild conc LVH, no rwma, Gr 1 DD, mild-mod AI, mild MR.  . Morbid obesity ([place])   . Obesity   . Peptic ulcer disease     Current Outpatient Medications on  File Prior to Visit  Medication Sig Dispense Refill  . amLODipine-valsartan (EXFORGE) 10-320 MG tablet Take 1 tablet by mouth daily.    Marland Kitchen aspirin EC 81 MG tablet Take 1 tablet (81 mg total) by mouth daily.    Marland Kitchen atorvastatin (LIPITOR) 10 MG tablet TAKE 1 TABLET BY MOUTH  DAILY 90 tablet 3  . chlorhexidine (PERIDEX) 0.12 % solution 15 mLs 2 (two) times daily.    . citalopram (CELEXA) 10 MG tablet Take 20 mg by mouth daily.     . cloNIDine (CATAPRES) 0.3 MG tablet Take 1 tablet (0.3 mg total) by mouth 2 (two) times daily. 180 tablet 2  . cyanocobalamin 100 MCG tablet Take 100 mcg by mouth daily.     . cyclobenzaprine (FLEXERIL) 10 MG tablet Take 10 mg by mouth 3 (three) times daily as needed for muscle spasms.    Marland Kitchen doxazosin (CARDURA) 8 MG tablet Take 8 mg  by mouth at bedtime.    Marland Kitchen ezetimibe (ZETIA) 10 MG tablet Take 10 mg by mouth daily.    . furosemide (LASIX) 20 MG tablet Take 20 mg by mouth 2 (two) times daily.     Marland Kitchen gabapentin (NEURONTIN) 800 MG tablet Take 1 tablet (800 mg total) by mouth 2 (two) times daily. 180 tablet 3  . indapamide (LOZOL) 2.5 MG tablet Take 2.5 mg by mouth daily.    . meclizine (ANTIVERT) 25 MG tablet Take 25 mg by mouth 3 (three) times daily as needed.    . meloxicam (MOBIC) 15 MG tablet Take 15 mg by mouth daily.    . methocarbamol (ROBAXIN) 500 MG tablet Take 500-1,000 mg by mouth 3 (three) times daily as needed.    . metoprolol succinate (TOPROL-XL) 50 MG 24 hr tablet Take 1 tablet (50 mg total) by mouth daily. Take with or immediately following a meal. 90 tablet 3  . mirabegron ER (MYRBETRIQ) 25 MG TB24 tablet Take 25 mg by mouth daily.    . Multiple Vitamin (MULTIVITAMIN) tablet Take 1 tablet by mouth daily.      . nitroGLYCERIN (NITROSTAT) 0.4 MG SL tablet Place 1 tablet (0.4 mg total) under the tongue every 5 (five) minutes as needed for chest pain. 25 tablet 3  . pantoprazole (PROTONIX) 40 MG tablet Take 40 mg by mouth 2 (two) times daily.     . potassium chloride (KLOR-CON) 10 MEQ tablet Take 10 mEq by mouth daily.    Marland Kitchen tiZANidine (ZANAFLEX) 4 MG tablet Take 8 mg by mouth 3 (three) times daily as needed.    . traMADol (ULTRAM) 50 MG tablet Take 50 mg by mouth every 8 (eight) hours as needed.     No current facility-administered medications on file prior to visit.    Allergies  Allergen Reactions  . Other Swelling    Hair dye, caused eye swelling, multiple times experienced this reaction  . Hydrocodone-Acetaminophen Nausea Only  . Oxycodone Nausea And Vomiting     Assessment/Plan:  1. Hypertension - BP is improved but still slightly above goal of <130/80 mmHg. Will increase metoprolol succinate to 100 mg daily. Will taper off clonidine since BP is nearly controlled after taking it only once daily at  night. Patient instructed to take clonidine 0.2 mg once nightly for 4 days, then 0.1 mg once nightly for 4 days, then stop. Given recent history of hypokalemia, will check BMET today and consider starting spironolactone pending BMET results. Educated patient on proper BP measuring technique. Patient will start measuring  BP 1-2 hours after morning medications and will bring readings and BP cuff to next visit. Encouraged patient to adhere to cardiac-healthy diet low in sodium and to increase physical activity as she is able to do so. Follow-up appointment scheduled in 2 weeks on 09/23/20.  Richardine Service, PharmD PGY2 Cardiology Pharmacy Resident

## 2020-09-08 ENCOUNTER — Ambulatory Visit (INDEPENDENT_AMBULATORY_CARE_PROVIDER_SITE_OTHER): Payer: Medicare Other | Admitting: Pharmacist

## 2020-09-08 ENCOUNTER — Other Ambulatory Visit: Payer: Self-pay

## 2020-09-08 VITALS — BP 128/86 | HR 90

## 2020-09-08 DIAGNOSIS — I10 Essential (primary) hypertension: Secondary | ICD-10-CM | POA: Diagnosis not present

## 2020-09-08 MED ORDER — METOPROLOL SUCCINATE ER 100 MG PO TB24: 100.0000 mg | ORAL_TABLET | Freq: Every day | ORAL | 11 refills | Status: DC

## 2020-09-08 MED ORDER — CLONIDINE HCL 0.1 MG PO TABS: ORAL_TABLET | ORAL | 0 refills | Status: DC

## 2020-09-09 ENCOUNTER — Telehealth: Payer: Self-pay | Admitting: Cardiology

## 2020-09-09 LAB — BASIC METABOLIC PANEL
BUN/Creatinine Ratio: 13 (ref 12–28)
BUN: 16 mg/dL (ref 8–27)
CO2: 24 mmol/L (ref 20–29)
Calcium: 9.2 mg/dL (ref 8.7–10.3)
Chloride: 94 mmol/L — ABNORMAL LOW (ref 96–106)
Creatinine, Ser: 1.24 mg/dL — ABNORMAL HIGH (ref 0.57–1.00)
GFR calc Af Amer: 52 mL/min/{1.73_m2} — ABNORMAL LOW (ref >59)
GFR calc non Af Amer: 45 mL/min/{1.73_m2} — ABNORMAL LOW (ref >59)
Glucose: 94 mg/dL (ref 65–99)
Potassium: 2.8 mmol/L — CL (ref 3.5–5.2)
Sodium: 141 mmol/L (ref 134–144)

## 2020-09-09 MED ORDER — POTASSIUM CHLORIDE ER 20 MEQ PO TBCR: 40.0000 meq | EXTENDED_RELEASE_TABLET | Freq: Two times a day (BID) | ORAL | 0 refills | Status: DC

## 2020-09-09 MED ORDER — SPIRONOLACTONE 25 MG PO TABS: 12.5000 mg | ORAL_TABLET | Freq: Every day | ORAL | 11 refills | Status: DC

## 2020-09-09 NOTE — Telephone Encounter (Signed)
Received page from [place] regarding critical lab valve of K +2.8 draw yesterday afternoon. Attempted to call patient but no answer. Voicemail left asking for CB regarding the need for medication adjustments. Will forward to PharmD that saw the patient yesterday to follow up on this morning as well.

## 2020-09-09 NOTE — Telephone Encounter (Signed)
I called and spoke with patient. Will have her take 40 MEQ of KCL x 2 doses. Will start spironolactone 12.5mg  daily. I have asked the patient to stop furosemide and use only prn. She is tapering down her clonidine.  Requested patient have labs rechecked on Friday, but she said she would not be able to request a ride. Patient will come in on Monday to have BMP rechecked.

## 2020-09-10 ENCOUNTER — Ambulatory Visit: Payer: Medicare Other | Attending: Family Medicine | Admitting: Physical Therapy

## 2020-09-10 VITALS — BP 123/75 | HR 52

## 2020-09-10 DIAGNOSIS — I69354 Hemiplegia and hemiparesis following cerebral infarction affecting left non-dominant side: Secondary | ICD-10-CM | POA: Insufficient documentation

## 2020-09-10 DIAGNOSIS — M6281 Muscle weakness (generalized): Secondary | ICD-10-CM | POA: Diagnosis present

## 2020-09-10 DIAGNOSIS — R2681 Unsteadiness on feet: Secondary | ICD-10-CM | POA: Diagnosis not present

## 2020-09-10 DIAGNOSIS — R2689 Other abnormalities of gait and mobility: Secondary | ICD-10-CM | POA: Diagnosis present

## 2020-09-10 DIAGNOSIS — R42 Dizziness and giddiness: Secondary | ICD-10-CM | POA: Diagnosis not present

## 2020-09-11 NOTE — Therapy (Signed)
[facility] [address] [place], [place], [address] Phone: [phone]   Fax:  [phone]  Physical Therapy Evaluation  Patient Details  Name: Chelsea [name] MRN: [id number] Date of Birth: 1951-02-10 Referring Provider (PT): Star Age, MD   Encounter Date: 09/10/2020   PT End of Session - 09/11/20 0918    Visit Number 1    Number of Visits 17    Date for PT Re-Evaluation 12/10/20   written for 60 day POC   Authorization Type [employer] Medicare & Medicaid    PT Start Time 1444    PT Stop Time 1528    PT Time Calculation (min) 44 min    Equipment Utilized During Treatment Gait belt    Activity Tolerance Patient tolerated treatment well   pt chatty at times - needing to be redirected to the task at hand   Behavior During Therapy [facility] for tasks assessed/performed           Past Medical History:  Diagnosis Date  . Anxiety   . Cerebrovascular accident [facility])    a. 2007-->residual left sided weakness  . Chest pain   . Depression   . GERD (gastroesophageal reflux disease)   . Headache   . Hx of hysterectomy   . Hyperlipidemia   . Hypertension   . Mild to Moderate Aortic Insufficiency    a. 08/2014 Echo: EF 55-60%, mild conc LVH, no rwma, Gr 1 DD, mild-mod AI, mild MR.  . Morbid obesity ([place])   . Obesity   . Peptic ulcer disease     Past Surgical History:  Procedure Laterality Date  . ABDOMINAL HYSTERECTOMY    . TOTAL KNEE ARTHROPLASTY Left 01/05/2016   Procedure: TOTAL KNEE ARTHROPLASTY;  Surgeon: Frederik Pear, MD;  Location: [facility];  Service: Orthopedics;  Laterality: Left;  . TUBAL LIGATION      Vitals:   09/10/20 1449  BP: 123/75  Pulse: (!) 52      Subjective Assessment - 09/10/20 1451    Subjective Primary concern is pt's L hand and arm. Reports L leg is feeling weak. Feels off balance when she is walking - feels like she is going to go forwards or backwards. Walks with a small base quad cane.  Most recent fall was when she blacked out and overheated. Recently seen here for BPPV, reports it has not come back, still taking meclizine. Reports that she has never gotten an AFO before. 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    Currently in Pain? No/denies    Pain Onset More than a month ago              [facility] PT Assessment - 09/10/20 1459      Assessment   Medical Diagnosis CVA (from 2007), LLE weakness    Referring Provider (PT) Star Age, MD    Onset Date/Surgical Date 08/11/20   DATE OF REFERRAL   Hand Dominance Right    Prior Therapy previous PT for knee surgery, recently for vertigo in 02/2020      Precautions   Precautions Fall      Balance Screen   Has the patient fallen in the past 6 months  Yes   fell in august due to passing out from heat   How many times? 1    Has the patient had a decrease in activity level because of a fear of falling?  Yes   is cautious when she gets up   Is the patient reluctant to leave their home because of a fear of falling?  No      Prior Function   Level of Independence Independent;Independent with community mobility with device;Independent with household mobility with device      Observation/Other Assessments   Observations LLE held in external rotation      Sensation   Light Touch Impaired Detail    Light Touch Impaired Details Absent LLE    Proprioception Impaired Detail    Proprioception Impaired Details Impaired LLE   3/5 correct responses at ankle   Additional Comments "felt something heavy on LLE, but could not tell what it was"      Coordination   Gross Motor Movements are Fluid and Coordinated No    Heel Shin Test impaired LLE due to weakness       ROM / Strength   AROM / PROM / Strength Strength      Strength   Strength Assessment Site Hip;Knee;Ankle    Right/Left Hip Left;Right    Right Hip Flexion 4-/5    Left Hip Flexion 3-/5    Right/Left Knee Right;Left    Right Knee Flexion 5/5    Right Knee Extension 5/5    Left Knee Flexion 4-/5    Left Knee Extension 4-/5    Right/Left Ankle Right;Left    Right Ankle Dorsiflexion 3+/5    Left Ankle Dorsiflexion 2+/5      Transfers   Transfers Sit to Stand;Stand to Sit    Sit to Stand 5: Supervision;Without upper extremity assist    Five time sit to stand comments  20.22 seconds without UE support    Stand to Sit 5: Supervision;Without upper extremity assist    Comments 30 second chair stand: 7 sit <> stands      Ambulation/Gait   Ambulation/Gait Yes    Ambulation/Gait Assistance 5: Supervision;4: Min guard    Ambulation/Gait Assistance Details LUE held in ADD/internal rotation, LLE externally rotated. pt ambulates with no AFO (has never received one). pt with no heel strike with LLE    Ambulation Distance (Feet) 115 Feet    Assistive device Small based quad cane    Gait Pattern Step-through pattern;Decreased arm swing - left;Decreased step length - right;Decreased stance time - left;Decreased hip/knee flexion - left;Decreased dorsiflexion - left;Decreased weight shift to left;Left genu recurvatum;Poor foot clearance - left;Left foot flat    Ambulation Surface Indoor;Level    Gait velocity 20.72 seconds = 1.58 ft/sec    Gait Comments pt reports furniture walking at home - educated on use of small base quad cane at all times due to fall risk      Standardized Balance Assessment   Standardized Balance Assessment Timed Up and Go Test      Timed Up and Go Test   Normal TUG (seconds) 19.09    TUG Comments with small base quad cane                      Objective measurements completed on examination: See above findings.               PT Education -  09/10/20 1524    Education Details clinical findings,  POC, use of small base quad cane at all time due to fall risk    Person(s) Educated Patient    Methods Explanation    Comprehension Verbalized understanding            PT Short Term Goals - 09/11/20 1216      PT SHORT TERM GOAL #1   Title Pt will undergo further assessment of BERG -LTG written as appropriate. ALL STGS DUE 10/09/20    Time 4    Period Weeks    Status New    Target Date 10/09/20      PT SHORT TERM GOAL #2   Title Pt will undergo further assessment of AFO for improved gait mechanics.    Time 4    Period Weeks    Status New      PT SHORT TERM GOAL #3   Title Pt will improve gait sped to at least 1.8 ft/sec with small base quad in order to decr fall risk.    Baseline 1.58 ft/sec    Time 4    Period Weeks    Status New      PT SHORT TERM GOAL #4   Title Pt will decr TUG to 17 seconds or less with small based quad cane in order to demo decr fall risk.    Baseline 19.09 seconds    Time 4    Period Weeks    Status New      PT SHORT TERM GOAL #5   Title Pt will improve 5x sit <> stand time with no UE support in 17 seconds or less in order to demo improved BLE strength.    Baseline 20.22 seconds with no UE support    Time 4    Period Weeks    Status New             PT Long Term Goals - 09/11/20 1221      PT LONG TERM GOAL #1   Title Pt will be independent with final HEP in order to build upon functional gains made in therapy. ALL LTGS DUE 11/06/20    Time 8    Period Weeks    Status New    Target Date 11/06/20      PT LONG TERM GOAL #2   Title BERG goal to be written as appropriate to decr fall risk.    Time 8    Period Weeks    Status New      PT LONG TERM GOAL #3   Title Pt will improve gait sped to at least 2.2 ft/sec with small base quad in order to improve gait efficiency.    Baseline 1.58 ft/sec    Time 8    Period Weeks    Status New      PT LONG TERM GOAL #4   Title Pt will  decr TUG to 15 seconds or less with small based quad cane in order to demo decr fall risk.    Baseline 19.09 seconds    Time 8    Period Weeks    Status New      PT LONG TERM GOAL #5   Title Pt will ambulate at least 300' over indoor and outdoor unlevel surfaces with supervision with small base quad cane in order to improve functional mobility.    Time 8    Period Weeks    Status New  Plan - 09/11/20 1225    Clinical Impression Statement Patient is a 69 year old female referred to Neuro OPPT for LLE weakness and imbalance from CVA (from 2007). Pt reports she has never received PT from after her stroke, but had PT recently at this clinic for BPPV/vestibular (no reports of dizziness in today's eval). Pt's PMH is significant for: h/o Rt CVA in 2007; anxiety & depression, HTN, mild to moderate aortic insufficiency. The following deficits were present during the exam: absent sensation of LLE, decr coordination, impaired BLE strength, impaired balance, gait abnormalities, decr endurance, incr LUE tone. Based on 5x sit <> stand, gait speed, and TUG pt is at a high risk for falls. Pt with L foot flat during gait with decr foot clearance and LLE held in external rotation - pt would benefit from further assessment of AFO to improve gait mechanics and decr fall risk. Pt would benefit from skilled PT to address these impairments and functional limitations to maximize functional mobility independence    Personal Factors and Comorbidities Time since onset of injury/illness/exacerbation;Comorbidity 3+;Past/Current Experience;Fitness    Comorbidities h/o Rt CVA in 2007; anxiety & depression, HTN, mild to moderate aortic insufficiency    Examination-Activity Limitations Squat;Transfers;Stand;Locomotion Level    Examination-Participation Restrictions Estate agent;Shop    Stability/Clinical Decision Making Stable/Uncomplicated    Clinical Decision Making Low    Rehab  Potential Good    PT Frequency 2x / week    PT Duration 8 weeks    PT Treatment/Interventions ADLs/Self Care Home Management;Therapeutic exercise;Therapeutic activities;Patient/family education;Passive range of motion;DME Instruction;Gait training;Functional mobility training;Stair training;Balance training;Neuromuscular re-education;Orthotic Fit/Training;Vestibular;Energy conservation    PT Next Visit Plan perform BERG. initial HEP for strengthening and standing balance with UE support. assess for AFO for LLE (trial braces in clinic)    Consulted and Agree with Plan of Care Patient           Patient will benefit from skilled therapeutic intervention in order to improve the following deficits and impairments:  Decreased strength, Decreased range of motion, Decreased activity tolerance, Abnormal gait, Decreased balance, Decreased coordination, Difficulty walking, Dizziness, Impaired sensation, Impaired tone  Visit Diagnosis: Unsteadiness on feet  Muscle weakness (generalized)  Dizziness and giddiness  Other abnormalities of gait and mobility     Problem List Patient Active Problem List   Diagnosis Date Noted  . 54 04/03/2020  . Nutritional counseling 01/19/2018  . Primary osteoarthritis of left knee 01/03/2016  . Mild to Moderate Aortic Insufficiency   . Hypertension   . Hyperlipidemia   . Morbid obesity ([place])   . Pulmonary hypertension ([place]) 01/30/2013  . Orthostatic hypotension 12/18/2012  . Aortic valve disorder 07/29/2010  . CHEST PAIN-PRECORDIAL 07/29/2010  . CYST OF THYROID 01/06/2010  . HYPOKALEMIA 01/06/2010  . HYPERLIPIDEMIA 12/24/2009  . OBESITY 12/24/2009  . HYPERTENSION, UNSPECIFIED 12/24/2009  . Personal history of unspecified circulatory disease 12/24/2009  . PEPTIC ULCER DISEASE, HX OF 12/24/2009    Arliss Journey, PT, DPT  09/11/2020, 12:31 PM  [facility] [address] [place],  [place], [address] Phone: [phone]   Fax:  [phone]  Name: Chelsea [name] MRN: [id number] Date of Birth: [birth year]

## 2020-09-15 ENCOUNTER — Other Ambulatory Visit: Payer: Self-pay

## 2020-09-15 ENCOUNTER — Other Ambulatory Visit: Payer: Medicare Other | Admitting: *Deleted

## 2020-09-15 DIAGNOSIS — I10 Essential (primary) hypertension: Secondary | ICD-10-CM

## 2020-09-15 LAB — BASIC METABOLIC PANEL
BUN/Creatinine Ratio: 18 (ref 12–28)
BUN: 21 mg/dL (ref 8–27)
CO2: 28 mmol/L (ref 20–29)
Calcium: 9.5 mg/dL (ref 8.7–10.3)
Chloride: 102 mmol/L (ref 96–106)
Creatinine, Ser: 1.14 mg/dL — ABNORMAL HIGH (ref 0.57–1.00)
GFR calc Af Amer: 57 mL/min/{1.73_m2} — ABNORMAL LOW (ref >59)
GFR calc non Af Amer: 50 mL/min/{1.73_m2} — ABNORMAL LOW (ref >59)
Glucose: 89 mg/dL (ref 65–99)
Potassium: 4.5 mmol/L (ref 3.5–5.2)
Sodium: 142 mmol/L (ref 134–144)

## 2020-09-18 ENCOUNTER — Telehealth: Payer: Self-pay | Admitting: Physical Therapy

## 2020-09-18 ENCOUNTER — Other Ambulatory Visit: Payer: Self-pay

## 2020-09-18 ENCOUNTER — Ambulatory Visit: Payer: Medicare Other

## 2020-09-18 ENCOUNTER — Telehealth: Payer: Self-pay | Admitting: Interventional Cardiology

## 2020-09-18 DIAGNOSIS — R2681 Unsteadiness on feet: Secondary | ICD-10-CM | POA: Diagnosis not present

## 2020-09-18 DIAGNOSIS — M6281 Muscle weakness (generalized): Secondary | ICD-10-CM | POA: Diagnosis not present

## 2020-09-18 DIAGNOSIS — R2689 Other abnormalities of gait and mobility: Secondary | ICD-10-CM

## 2020-09-18 DIAGNOSIS — I69354 Hemiplegia and hemiparesis following cerebral infarction affecting left non-dominant side: Secondary | ICD-10-CM

## 2020-09-18 DIAGNOSIS — I69398 Other sequelae of cerebral infarction: Secondary | ICD-10-CM

## 2020-09-18 DIAGNOSIS — R279 Unspecified lack of coordination: Secondary | ICD-10-CM

## 2020-09-18 DIAGNOSIS — E876 Hypokalemia: Secondary | ICD-10-CM

## 2020-09-18 DIAGNOSIS — R42 Dizziness and giddiness: Secondary | ICD-10-CM | POA: Diagnosis not present

## 2020-09-18 NOTE — Telephone Encounter (Signed)
Gave lab result and recommendations per MD Tamala Julian. Schedule BMP 10/01/20.

## 2020-09-18 NOTE — Therapy (Signed)
[facility] [address] [place], [place], [address] Phone: [phone]   Fax:  [phone]  Physical Therapy Treatment  Patient Details  Name: Chelsea [name] MRN: [id number] Date of Birth: [birth year] Referring Provider (PT): Star Age, MD   Encounter Date: 09/18/2020   PT End of Session - 09/18/20 1633    Visit Number 2    Number of Visits 17    Date for PT Re-Evaluation 12/10/20   written for 60 day POC   Authorization Type UHC Medicare & Medicaid    PT Start Time 1447    PT Stop Time 1529    PT Time Calculation (min) 42 min    Equipment Utilized During Treatment Gait belt    Activity Tolerance Patient tolerated treatment well   pt chatty at times - needing to be redirected to the task at hand   Behavior During Therapy [facility] for tasks assessed/performed           Past Medical History:  Diagnosis Date  . Anxiety   . Cerebrovascular accident [facility])    a. 2007-->residual left sided weakness  . Chest pain   . Depression   . GERD (gastroesophageal reflux disease)   . Headache   . Hx of hysterectomy   . Hyperlipidemia   . Hypertension   . Mild to Moderate Aortic Insufficiency    a. 08/2014 Echo: EF 55-60%, mild conc LVH, no rwma, Gr 1 DD, mild-mod AI, mild MR.  . Morbid obesity ([place])   . Obesity   . Peptic ulcer disease     Past Surgical History:  Procedure Laterality Date  . ABDOMINAL HYSTERECTOMY    . TOTAL KNEE ARTHROPLASTY Left 01/05/2016   Procedure: TOTAL KNEE ARTHROPLASTY;  Surgeon: Frederik Pear, MD;  Location: [place];  Service: Orthopedics;  Laterality: Left;  . TUBAL LIGATION      There were no vitals filed for this visit.   Subjective Assessment - 09/18/20 1452    Subjective Patient reports no changes since initial visit. Reports that she is using the cane more in the house for safety. No falls. Reports brought in brace today.    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    Currently in Pain? No/denies    Pain Onset More than a month ago                  [facility] Adult PT Treatment/Exercise - 09/18/20 0001      Transfers   Transfers Sit to Stand;Stand to Sit    Sit to Stand 5: Supervision    Stand to Sit 5: Supervision    Comments completed from standard chair with UE support      Ambulation/Gait   Ambulation/Gait Yes    Ambulation/Gait Assistance 4: Min guard    Ambulation/Gait Assistance Details Patient brought in short ankle AFO. DId not don/doff this ankle brace during session today, patient reports that it does not provide her assistance. Completed ambulation with various AFO, initially started with L ottobok with medial strut, patient reports increased pain with completion due to pressure on medial ankle x 50 ft. Reports AFO rubbed on medial ankle and was uncomfortable. Donned L [place] with Lateral Strut x 115 ft.  Patient reports no pain with this AFO. Patient demo improved stability with this AFO, however minimal gait improvemetns noted regarding knee and ankle position due to deceased medial/lateral stability. PT educating on will plan to call [employer] to further assess AFO options.     Ambulation Distance (Feet) 115 Feet   x1, 50 x 1   Assistive device Small based quad cane    Gait Pattern Step-through pattern;Decreased arm swing - left;Decreased step length - right;Decreased stance time - left;Decreased hip/knee flexion - left;Decreased dorsiflexion - left;Decreased weight shift to left;Left genu recurvatum;Poor foot clearance - left;Left foot flat    Ambulation Surface Level;Indoor      Standardized Balance Assessment   Standardized Balance Assessment Berg Balance Test      Berg Balance Test   Sit to Stand Able  to stand  independently using hands    Standing Unsupported Able to stand 2 minutes with supervision    Sitting with Back Unsupported but Feet Supported on Floor or Stool Able to sit safely and securely 2 minutes    Stand to Sit Controls descent by using hands    Transfers Able to transfer safely, definite need of hands    Standing Unsupported with Eyes Closed Able to stand 10 seconds with supervision    Standing Ubsupported with Feet Together Needs help to attain position but able to stand for 30 seconds with feet together    From Standing, Reach Forward with Outstretched Arm Reaches forward but needs supervision    From Standing Position, Pick up Object from [place] to pick up shoe, needs supervision    From Standing Position, Turn to Look Behind Over each Shoulder Turn sideways only but maintains balance    Turn 360 Degrees Needs close supervision or verbal cueing    Standing Unsupported, Alternately Place Feet on Step/Stool Able to complete >2 steps/needs minimal assist    Standing Unsupported, One Foot in Front Needs help to step but can hold 15 seconds    Standing on One Leg Unable to try or needs assist to prevent fall    Total Score 29                  PT Education - 09/18/20 1623    Education Details educated on AFO options; will call [employer] to set up Appt    Person(s) Educated Patient    Methods Explanation    Comprehension Verbalized understanding            PT Short Term Goals - 09/11/20 1216      PT SHORT TERM GOAL #1   Title Pt will undergo further assessment of BERG -LTG written as appropriate. ALL STGS DUE 10/09/20    Time 4    Period Weeks    Status New    Target Date 10/09/20      PT SHORT TERM GOAL #2   Title Pt will undergo further assessment of AFO for improved gait mechanics.    Time 4    Period Weeks    Status New      PT SHORT TERM GOAL #3   Title Pt will improve gait sped to at least 1.8 ft/sec with small base quad in order to decr fall  risk.    Baseline 1.58 ft/sec    Time 4    Period Weeks    Status New      PT SHORT TERM GOAL #4   Title Pt will decr TUG to 17 seconds or less  with small based quad cane in order to demo decr fall risk.    Baseline 19.09 seconds    Time 4    Period Weeks    Status New      PT SHORT TERM GOAL #5   Title Pt will improve 5x sit <> stand time with no UE support in 17 seconds or less in order to demo improved BLE strength.    Baseline 20.22 seconds with no UE support    Time 4    Period Weeks    Status New             PT Long Term Goals - 09/11/20 1221      PT LONG TERM GOAL #1   Title Pt will be independent with final HEP in order to build upon functional gains made in therapy. ALL LTGS DUE 11/06/20    Time 8    Period Weeks    Status New    Target Date 11/06/20      PT LONG TERM GOAL #2   Title BERG goal to be written as appropriate to decr fall risk.    Time 8    Period Weeks    Status New      PT LONG TERM GOAL #3   Title Pt will improve gait sped to at least 2.2 ft/sec with small base quad in order to improve gait efficiency.    Baseline 1.58 ft/sec    Time 8    Period Weeks    Status New      PT LONG TERM GOAL #4   Title Pt will decr TUG to 15 seconds or less with small based quad cane in order to demo decr fall risk.    Baseline 19.09 seconds    Time 8    Period Weeks    Status New      PT LONG TERM GOAL #5   Title Pt will ambulate at least 300' over indoor and outdoor unlevel surfaces with supervision with small base quad cane in order to improve functional mobility.    Time 8    Period Weeks    Status New                 Plan - 09/18/20 1633    Clinical Impression Statement Today's skilled PT session included further balance assessment with Berg Balance Scale, patient scoring 29/56 demonstrating increased fall risk. Spent rest of session assessing potential AFO options for patient to demonstrate improvements in gait. Minimal improvements noted  today, but patient reports feeling more stability with L Thuasne w/ Lateral Strut. Will continue to progress toward all goals.    Personal Factors and Comorbidities Time since onset of injury/illness/exacerbation;Comorbidity 3+;Past/Current Experience;Fitness    Comorbidities h/o Rt CVA in 2007; anxiety & depression, HTN, mild to moderate aortic insufficiency    Examination-Activity Limitations Squat;Transfers;Stand;Locomotion Level    Examination-Participation Restrictions Estate agent;Shop    Stability/Clinical Decision Making Stable/Uncomplicated    Rehab Potential Good    PT Frequency 2x / week    PT Duration 8 weeks    PT Treatment/Interventions ADLs/Self Care Home Management;Therapeutic exercise;Therapeutic activities;Patient/family education;Passive range of motion;DME Instruction;Gait training;Functional mobility training;Stair training;Balance training;Neuromuscular re-education;Orthotic Fit/Training;Vestibular;Energy conservation    PT Next Visit Plan Update from [place]? initial HEP for strengthening and standing balance with UE support.    Consulted and Agree with Plan of Care Patient           Patient will benefit from skilled therapeutic intervention  in order to improve the following deficits and impairments:  Decreased strength, Decreased range of motion, Decreased activity tolerance, Abnormal gait, Decreased balance, Decreased coordination, Difficulty walking, Dizziness, Impaired sensation, Impaired tone  Visit Diagnosis: Unsteadiness on feet  Muscle weakness (generalized)  Other abnormalities of gait and mobility     Problem List Patient Active Problem List   Diagnosis Date Noted  . 54 04/03/2020  . Nutritional counseling 01/19/2018  . Primary osteoarthritis of left knee 01/03/2016  . Mild to Moderate Aortic Insufficiency   . Hypertension   . Hyperlipidemia   . Morbid obesity ([place])   . Pulmonary hypertension ([place]) 01/30/2013  .  Orthostatic hypotension 12/18/2012  . Aortic valve disorder 07/29/2010  . CHEST PAIN-PRECORDIAL 07/29/2010  . CYST OF THYROID 01/06/2010  . HYPOKALEMIA 01/06/2010  . HYPERLIPIDEMIA 12/24/2009  . OBESITY 12/24/2009  . HYPERTENSION, UNSPECIFIED 12/24/2009  . Personal history of unspecified circulatory disease 12/24/2009  . PEPTIC ULCER DISEASE, HX OF 12/24/2009    Jones Bales, PT, DPT 09/18/2020, 4:35 PM  [facility] [name] [place], [place], [address] Phone: [phone]   Fax:  [phone]  Name: Melika Reder MRN: [id number] Date of Birth: 01/11/1951

## 2020-09-18 NOTE — Telephone Encounter (Signed)
Dr. Rexene Alberts, Donzetta Matters. Spellman was evaluated by PT at [facility].  The patient would benefit from an OT evaluation for for LUE weakness/decr ROM, and spasticity from CVA.   If you agree, please place an order in [facility] workque in [facility] or fax the order to [phone]. Thank you, Janann August, PT, DPT 09/18/20 3:00 PM    [facility] [address] [place], [place]  [address] Phone:  [phone] Fax:  [phone]

## 2020-09-18 NOTE — Telephone Encounter (Signed)
Chelsea [name] is returning [name]'s call in regards to her results. Please advise.

## 2020-09-18 NOTE — Telephone Encounter (Signed)
Referral placed to neuro rehab OT as per evaluation through PT.

## 2020-09-22 ENCOUNTER — Other Ambulatory Visit: Payer: Self-pay

## 2020-09-22 ENCOUNTER — Ambulatory Visit: Payer: Medicare Other

## 2020-09-22 DIAGNOSIS — R2689 Other abnormalities of gait and mobility: Secondary | ICD-10-CM

## 2020-09-22 DIAGNOSIS — M6281 Muscle weakness (generalized): Secondary | ICD-10-CM

## 2020-09-22 DIAGNOSIS — I69354 Hemiplegia and hemiparesis following cerebral infarction affecting left non-dominant side: Secondary | ICD-10-CM

## 2020-09-22 DIAGNOSIS — R2681 Unsteadiness on feet: Secondary | ICD-10-CM | POA: Diagnosis not present

## 2020-09-22 DIAGNOSIS — R42 Dizziness and giddiness: Secondary | ICD-10-CM | POA: Diagnosis not present

## 2020-09-22 NOTE — Therapy (Signed)
[facility] [address] [place], [place], [address] Phone: [phone]   Fax:  [phone]  Physical Therapy Treatment  Patient Details  Name: Chelsea [name] MRN: [id number] Date of Birth: 1950-12-22 Referring Provider (PT): Star Age, MD   Encounter Date: 09/22/2020   PT End of Session - 09/22/20 1409    Visit Number 3    Number of Visits 17    Date for PT Re-Evaluation 12/10/20   written for 60 day POC   Authorization Type UHC Medicare & Medicaid    PT Start Time 1402    PT Stop Time 1444    PT Time Calculation (min) 42 min    Equipment Utilized During Treatment Gait belt    Activity Tolerance Patient tolerated treatment well   pt chatty at times - needing to be redirected to the task at hand   Behavior During Therapy [facility] for tasks assessed/performed           Past Medical History:  Diagnosis Date  . Anxiety   . Cerebrovascular accident [facility])    a. 2007-->residual left sided weakness  . Chest pain   . Depression   . GERD (gastroesophageal reflux disease)   . Headache   . Hx of hysterectomy   . Hyperlipidemia   . Hypertension   . Mild to Moderate Aortic Insufficiency    a. 08/2014 Echo: EF 55-60%, mild conc LVH, no rwma, Gr 1 DD, mild-mod AI, mild MR.  . Morbid obesity ([place])   . Obesity   . Peptic ulcer disease     Past Surgical History:  Procedure Laterality Date  . ABDOMINAL HYSTERECTOMY    . TOTAL KNEE ARTHROPLASTY Left 01/05/2016   Procedure: TOTAL KNEE ARTHROPLASTY;  Surgeon: Frederik Pear, MD;  Location: [place];  Service: Orthopedics;  Laterality: Left;  . TUBAL LIGATION      There were no vitals filed for this visit.   Subjective Assessment - 09/22/20 1405    Subjective Patient reports no new changes since last visit. But does report she had an incident where she started sweating and needed to rest. Reports no chest pain or signs and symptoms of CVA. Reports felt better with cool air.  No falls.    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    Currently in Pain? No/denies    Pain Onset More than a month ago                   [facility] Adult PT Treatment/Exercise - 09/22/20 0001      Transfers   Transfers Sit to Stand;Stand to Sit    Sit to Stand 5: Supervision    Stand to Sit 5: Supervision      Ambulation/Gait   Ambulation/Gait Yes    Ambulation/Gait Assistance 4: Min guard    Ambulation/Gait Assistance Details ambulating into/out of therapy session    Ambulation Distance (Feet) --   clinic distances   Assistive device Small based quad cane    Gait Pattern Step-through pattern;Decreased arm swing - left;Decreased step length - right;Decreased stance time - left;Decreased hip/knee flexion - left;Decreased dorsiflexion - left;Decreased weight shift to left;Left genu recurvatum;Poor foot clearance - left;Left foot flat    Ambulation  Surface Level;Indoor      Self-Care   Self-Care Other Self-Care Comments    Other Self-Care Comments  PT educating on Gerald Stabs from [facility] will be present at 11/5 appointment. Patient requesting paper with this information written down, PT provided.       Exercises   Exercises Knee/Hip;Other Exercises    Other Exercises  standing heel/toe raises 2 x 10 reps with UE support from chair. verbal cues to avoid bending at hips.       Knee/Hip Exercises: Seated   Hamstring Curl AROM;Strengthening;Both;2 sets;10 reps;Limitations    Hamstring Limitations completed with green theraband, verbal cues for control. PT educating to attach band to sturdy surface for completion at home.       Knee/Hip Exercises: Supine   Bridges Strengthening;Both;2 sets;10 reps;Limitations    Bridges Limitations  completed with red theraband around thighs. verbal cues to include 2-3 second for control.     Other Supine Knee/Hip Exercises completed hooklying alternating marching 2 x 10 reps with red theraband, verbal cues for technique and control with completion.       Knee/Hip Exercises: Sidelying   Clams completed clamshell bilaterally with red theraband, 2 x 10 reps with BLE. verbal cues required for technique.            Initial HEP Established on 10/25:  Access Code: [email or web address] URL: [email or web address] Date: 09/22/2020 Prepared by: Baldomero Lamy  Exercises Supine Bridge with Resistance Band - 1 x daily - 5 x weekly - 2 sets - 10 reps Supine March with Resistance Band - 1 x daily - 5 x weekly - 2 sets - 10 reps Clamshell with Resistance - 1 x daily - 5 x weekly - 2 sets - 10 reps Seated Hamstring Curl with Anchored Resistance - 1 x daily - 5 x weekly - 2 sets - 10 reps Heel Toe Raises with Counter Support - 1 x daily - 5 x weekly - 2 sets - 10 reps        PT Education - 09/22/20 1409    Education Details Gerald Stabs from [facility] Present at [facility] on 11/5; Initial HEP    Person(s) Educated Patient    Methods Explanation;Handout    Comprehension Verbalized understanding;Returned demonstration;Need further instruction;Verbal cues required            PT Short Term Goals - 09/11/20 1216      PT SHORT TERM GOAL #1   Title Pt will undergo further assessment of BERG -LTG written as appropriate. ALL STGS DUE 10/09/20    Time 4    Period Weeks    Status New    Target Date 10/09/20      PT SHORT TERM GOAL #2   Title Pt will undergo further assessment of AFO for improved gait mechanics.    Time 4    Period Weeks    Status New      PT SHORT TERM GOAL #3   Title Pt will improve gait sped to at least 1.8 ft/sec with small base quad in order to decr fall risk.    Baseline 1.58 ft/sec    Time 4    Period Weeks    Status New      PT SHORT TERM GOAL #4   Title Pt  will decr TUG to 17 seconds or less with small based quad cane in order to demo decr fall risk.    Baseline 19.09 seconds    Time 4    Period Weeks  Status New      PT SHORT TERM GOAL #5   Title Pt will improve 5x sit <> stand time with no UE support in 17 seconds or less in order to demo improved BLE strength.    Baseline 20.22 seconds with no UE support    Time 4    Period Weeks    Status New             PT Long Term Goals - 09/11/20 1221      PT LONG TERM GOAL #1   Title Pt will be independent with final HEP in order to build upon functional gains made in therapy. ALL LTGS DUE 11/06/20    Time 8    Period Weeks    Status New    Target Date 11/06/20      PT LONG TERM GOAL #2   Title BERG goal to be written as appropriate to decr fall risk.    Time 8    Period Weeks    Status New      PT LONG TERM GOAL #3   Title Pt will improve gait sped to at least 2.2 ft/sec with small base quad in order to improve gait efficiency.    Baseline 1.58 ft/sec    Time 8    Period Weeks    Status New      PT LONG TERM GOAL #4   Title Pt will decr TUG to 15 seconds or less with small based quad cane in order to demo decr fall risk.    Baseline 19.09 seconds    Time 8    Period Weeks    Status New      PT LONG TERM GOAL #5   Title Pt will ambulate at least 300' over indoor and outdoor unlevel surfaces with supervision with small base quad cane in order to improve functional mobility.    Time 8    Period Weeks    Status New                 Plan - 09/22/20 1452    Clinical Impression Statement Today's skilled PT session included establishing initial supine and standing strengthening HEP to patient's tolerance. Patient require intermittent rest breaks due to fatigue in BLE's. Patient overall tolerating all exercises well. Will continue to progress toward all goals.    Personal Factors and Comorbidities Time since onset of injury/illness/exacerbation;Comorbidity  3+;Past/Current Experience;Fitness    Comorbidities h/o Rt CVA in 2007; anxiety & depression, HTN, mild to moderate aortic insufficiency    Examination-Activity Limitations Squat;Transfers;Stand;Locomotion Level    Examination-Participation Restrictions Estate agent;Shop    Stability/Clinical Decision Making Stable/Uncomplicated    Rehab Potential Good    PT Frequency 2x / week    PT Duration 8 weeks    PT Treatment/Interventions ADLs/Self Care Home Management;Therapeutic exercise;Therapeutic activities;Patient/family education;Passive range of motion;DME Instruction;Gait training;Functional mobility training;Stair training;Balance training;Neuromuscular re-education;Orthotic Fit/Training;Vestibular;Energy conservation    PT Next Visit Plan How was initial HEP? review as needed. continue gait training with AFO. Gerald Stabs will be present at 11/5 appt. standing balance with UE support (add to HEP as needed)    Consulted and Agree with Plan of Care Patient           Patient will benefit from skilled therapeutic intervention in order to improve the following deficits and impairments:  Decreased strength, Decreased range of motion, Decreased activity tolerance, Abnormal gait, Decreased balance, Decreased coordination, Difficulty walking, Dizziness, Impaired sensation, Impaired tone  Visit  Diagnosis: Hemiparesis affecting left side as late effect of cerebrovascular accident ([place])  Unsteadiness on feet  Muscle weakness (generalized)  Other abnormalities of gait and mobility     Problem List Patient Active Problem List   Diagnosis Date Noted  . 54 04/03/2020  . Nutritional counseling 01/19/2018  . Primary osteoarthritis of left knee 01/03/2016  . Mild to Moderate Aortic Insufficiency   . Hypertension   . Hyperlipidemia   . Morbid obesity ([place])   . Pulmonary hypertension ([place]) 01/30/2013  . Orthostatic hypotension 12/18/2012  . Aortic valve disorder 07/29/2010  .  CHEST PAIN-PRECORDIAL 07/29/2010  . CYST OF THYROID 01/06/2010  . HYPOKALEMIA 01/06/2010  . HYPERLIPIDEMIA 12/24/2009  . OBESITY 12/24/2009  . HYPERTENSION, UNSPECIFIED 12/24/2009  . Personal history of unspecified circulatory disease 12/24/2009  . PEPTIC ULCER DISEASE, HX OF 12/24/2009    Jones Bales, PT, DPT 09/22/2020, 2:56 PM  [facility] [address] [place], [place], [address] Phone: [phone]   Fax:  [phone]  Name: Chenita Ruda MRN: [id number] Date of Birth: [birth year]

## 2020-09-22 NOTE — Progress Notes (Signed)
Patient ID: Satcha Storlie                 DOB: 11-24-1951                      MRN: [id number]     HPI: Glena Pharris is a 69 y.o. female referred by Dr. Tamala Julian to HTN clinic. PMH is significant for mild aortic regurgitation, non-cardiac chest pain, hx stroke and residual left-sided weakness, HLD, HTN, and moderate obesity. Pt was last seen by Dr. Tamala Julian on 08/08/20 where BP was elevated at 154/96 and HR 79. Pt was started on metoprolol succinate 50 mg/day.  The patient was last seen in HTN clinic 09/08/20 & reported erratic home BP readings ranging 180/99 to 80-90s SBP w/ headaches reported with elevated BP. Home BP readings were taken prior to morning anti-HTN medications & recent office BP have been controlled. At this time, the patient was feeling that her BP was improving after initiation of metoprolol with office readings 123/75-128/86 HR 52-90. Metoprolol was increased at this time to 100mg  daily & clonidine was titrated off (over 1 wk). Patient was recently started on spironolactone 12.5mg  10/11 w/ 55meq x3 for hypokalemia.   She has been doing ok. She states that the past weekend she went to the grocery store and got overheated and was sweating. Denies dizziness, does have tension headaches (no changes), denies blurred vision. Patient states that she has tolerated spironolactone, but has had increased urination since starting it. Upon questioning, the patient is taking spironolactone 12.5mg  BID rather than once daily. The patient also states that she is taking an unknown strength of potassium that she had at home. The patient also states that she finished the clonidine taper that she previously started, but than started taking an unknown strength supply that she had at home.   Regarding smoking cessation, the patient states that she has not had a cigarette in 3 days, but was smoking 1 pack/day previously. The patient states that she has previously tried to obtain nicotine patches, but that  she was not able to afford the OTC products.   Current HTN meds: amlodipine/valsartan (Exforge) 10/320 mg daily, metoprolol succinate 100 mg daily, doxazosin 8 mg QHS, indapamide 2.5 mg daily, Spironolactone 12.5mg  daily (2 wks) w/ k replacement x3d.   Previously tried: lisinopril 10 mg daily, bumetanide 1 mg daily,  BP goal: <130/80 mmHg  Family History: Cancer in her brother; Cirrhosis in her father; Drug abuse in her brother; Heart attack in an other family member; Hypertension in her mother; Peripheral vascular disease in her mother.  Social History: current smoker (has not had a cigarette in 3 days)  - Currently smoking 1 pack/day (most 3 packs/day)  Diet: Does not add salt to food and uses Mrs DASH. 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 - has been going to PT   Home BP readings: Fluctuates, measures prior to taking morning BP meds - ranges  120/85 - 129/80 (AM & PM readings)  Labs: 01/15/20: Scr 1.09, K 3.1, Na 145 01/22/20: Scr 1.27, K 4.2, Na 144 08/11/20: Scr 1.17, K 3.0, Na 142 09/08/20: Scr 1.24, K 2.8, Na 141 09/15/20: Scr 1.14, K 4.5, Na 142  Wt Readings from Last 3 Encounters:  08/11/20 204 lb (92.5 kg)  08/08/20 205 lb 6.4 oz (93.2 kg)  10/16/19 202 lb 6.4  oz (91.8 kg)   BP Readings from Last 3 Encounters:  09/10/20 123/75  09/08/20 128/86  08/11/20 (!) 142/86   Pulse Readings from Last 3 Encounters:  09/10/20 (!) 52  09/08/20 90  08/11/20 69    Renal function: CrCl cannot be calculated (Unknown ideal weight.).  Past Medical History:  Diagnosis Date  . Anxiety   . Cerebrovascular accident [facility])    a. 2007-->residual left sided weakness  . Chest pain   . Depression   . GERD (gastroesophageal reflux disease)   . Headache   . Hx of hysterectomy   . Hyperlipidemia   . Hypertension   . Mild to Moderate Aortic Insufficiency    a.  08/2014 Echo: EF 55-60%, mild conc LVH, no rwma, Gr 1 DD, mild-mod AI, mild MR.  . Morbid obesity ([place])   . Obesity   . Peptic ulcer disease     Current Outpatient Medications on File Prior to Visit  Medication Sig Dispense Refill  . amLODipine-valsartan (EXFORGE) 10-320 MG tablet Take 1 tablet by mouth daily.    Marland Kitchen aspirin EC 81 MG tablet Take 1 tablet (81 mg total) by mouth daily.    Marland Kitchen atorvastatin (LIPITOR) 10 MG tablet TAKE 1 TABLET BY MOUTH  DAILY 90 tablet 3  . chlorhexidine (PERIDEX) 0.12 % solution 15 mLs 2 (two) times daily.    . citalopram (CELEXA) 10 MG tablet Take 20 mg by mouth daily.     . cloNIDine (CATAPRES) 0.1 MG tablet Take 0.2 mg by mouth once at bedtime for FOUR days, then take 0.1 mg by mouth once at bedtime for FOUR days, then stop 12 tablet 0  . cyanocobalamin 100 MCG tablet Take 100 mcg by mouth daily.     . cyclobenzaprine (FLEXERIL) 10 MG tablet Take 10 mg by mouth 3 (three) times daily as needed for muscle spasms.    Marland Kitchen doxazosin (CARDURA) 8 MG tablet Take 8 mg by mouth at bedtime.    Marland Kitchen ezetimibe (ZETIA) 10 MG tablet Take 10 mg by mouth daily.    . furosemide (LASIX) 20 MG tablet Take 20 mg by mouth as needed.    . gabapentin (NEURONTIN) 800 MG tablet Take 1 tablet (800 mg total) by mouth 2 (two) times daily. 180 tablet 3  . indapamide (LOZOL) 2.5 MG tablet Take 2.5 mg by mouth daily.    . meclizine (ANTIVERT) 25 MG tablet Take 25 mg by mouth 3 (three) times daily as needed.    . meloxicam (MOBIC) 15 MG tablet Take 15 mg by mouth daily.    . methocarbamol (ROBAXIN) 500 MG tablet Take 500-1,000 mg by mouth 3 (three) times daily as needed.    . metoprolol succinate (TOPROL-XL) 100 MG 24 hr tablet Take 1 tablet (100 mg total) by mouth daily. Take with or immediately following a meal. 30 tablet 11  . mirabegron ER (MYRBETRIQ) 25 MG TB24 tablet Take 25 mg by mouth daily.    . Multiple Vitamin (MULTIVITAMIN) tablet Take 1 tablet by mouth daily.      . nitroGLYCERIN  (NITROSTAT) 0.4 MG SL tablet Place 1 tablet (0.4 mg total) under the tongue every 5 (five) minutes as needed for chest pain. 25 tablet 3  . pantoprazole (PROTONIX) 40 MG tablet Take 40 mg by mouth 2 (two) times daily.     . Potassium Chloride ER 20 MEQ TBCR Take 40 mEq by mouth 2 (two) times daily. 2 tablets twice a day for 2 doses 4 tablet  0  . spironolactone (ALDACTONE) 25 MG tablet Take 0.5 tablets (12.5 mg total) by mouth daily. 15 tablet 11  . tiZANidine (ZANAFLEX) 4 MG tablet Take 8 mg by mouth 3 (three) times daily as needed.    . traMADol (ULTRAM) 50 MG tablet Take 50 mg by mouth every 8 (eight) hours as needed.     No current facility-administered medications on file prior to visit.    Allergies  Allergen Reactions  . Other Swelling    Hair dye, caused eye swelling, multiple times experienced this reaction  . Hydrocodone-Acetaminophen Nausea Only  . Oxycodone Nausea And Vomiting    Vitals obtained today: BP 132/88 SPO2: 96% HR: 58   Assessment/Plan:  1. Hypertension -  Patient's BP appears controlled per home BP readings (<130/80). Patients slightly elevated BP in clinic today is likely attributed to patient talking and moving around during our visit today. The patient appears to be tolerating current medications without dizziness or blurred vision. Given that the patient incorrectly started spironolactone 12.5mg  BID vs 12.5 mg daily & reported taking an unknown amount of potassium, will get BMET today. Will call patient with instruction regarding potassium supplementation. Also requested that patient take spironolactone 12.5mg  daily vs BID & to administer in the morning to prevent increased urination at night. If needed, can consider increasing in the future for additional BP control. Given patient's blood pressure control room to increase first-line therapy, will elect to titrate the patient's clonidine today. The patient's current clonidine dose is unknown, so will call the  patient 09/24/20 to obtain current clonidine dose and develop taper schedule.  Encouraged patient to contact PCP regarding hot flashes that she states are rather bothersome. Commended patient's efforts of smoking cessation. Per patient request, provided patient with Rx for 21mg /24hr nicotine patches. Considering patient cannot afford OTC patches, provided patient with the [place] quit line in the event that her insurance does not cover this Rx.   Scheduled follow-up with patient in 2wks time to assess clonidine taper tolerance, smoking cessation efforts, and need for first-line anti-htn medication therapy dose increase.  Thank you, Otho Darner, Pharmacy Student Class of 2022  Lorel Monaco, PharmD PGY2 Ambulatory Care Pharmacy Resident [place]

## 2020-09-22 NOTE — Patient Instructions (Signed)
Access Code: [email or web address] URL: [email or web address] Date: 09/22/2020 Prepared by: Baldomero Lamy  Exercises Supine Bridge with Resistance Band - 1 x daily - 5 x weekly - 2 sets - 10 reps Supine March with Resistance Band - 1 x daily - 5 x weekly - 2 sets - 10 reps Clamshell with Resistance - 1 x daily - 5 x weekly - 2 sets - 10 reps Seated Hamstring Curl with Anchored Resistance - 1 x daily - 5 x weekly - 2 sets - 10 reps Heel Toe Raises with Counter Support - 1 x daily - 5 x weekly - 2 sets - 10 reps

## 2020-09-23 ENCOUNTER — Other Ambulatory Visit: Payer: Self-pay

## 2020-09-23 ENCOUNTER — Ambulatory Visit (INDEPENDENT_AMBULATORY_CARE_PROVIDER_SITE_OTHER): Payer: Medicare Other | Admitting: Pharmacist

## 2020-09-23 VITALS — BP 132/88 | HR 58

## 2020-09-23 DIAGNOSIS — E876 Hypokalemia: Secondary | ICD-10-CM

## 2020-09-23 DIAGNOSIS — Z720 Tobacco use: Secondary | ICD-10-CM | POA: Diagnosis not present

## 2020-09-23 DIAGNOSIS — I10 Essential (primary) hypertension: Secondary | ICD-10-CM

## 2020-09-23 MED ORDER — NICOTINE 21 MG/24HR TD PT24: 21.0000 mg | MEDICATED_PATCH | Freq: Every day | TRANSDERMAL | 2 refills | Status: DC

## 2020-09-23 NOTE — Patient Instructions (Addendum)
Thank you for seeing [place] today!  Your blood pressure looks good today. We would like to have you slowly decrease your clonidine and eventually discontinue it. Because we need to know how much clonidine you are taking before we can decrease it, I will call you tomorrow morning and ask you the strength.   CONTINUE taking all other medications: - 12.5mg  spironolactone ONCE daily (1/2 tablet) - Exforge 10/320mg  daily - metoprolol succinate 100mg  daily - doxazosin 8mg  every night  - Indapamide 2.5mg  daily   We will get labs from you today to make sure that your kidney and electrolytes are doing well. I will call you tomorrow with the lab results.   Great job with your smoking cessation.To make this easier for you, we suggest that you try nicotine patches. Use one 21mg /24hr patch for no longer than 24hrs. You may want to remove the patch before bed and reapply a new one in the morning to prevent sleep disturbances. We sent these to your insurance, but in the event that your insurance does not cover this, you can call the below number to obtain these patches:  [place] Quitline - 1-800-QUIT-NOW [phone])  We will reschedule a visit with you in 2 wks to assess how you are doing. If you need [place] in the meantime, please call us directly at [phone].  Thank you! Abby

## 2020-09-24 ENCOUNTER — Other Ambulatory Visit: Payer: Self-pay | Admitting: *Deleted

## 2020-09-24 ENCOUNTER — Telehealth: Payer: Self-pay

## 2020-09-24 ENCOUNTER — Encounter: Payer: Self-pay | Admitting: Physical Therapy

## 2020-09-24 ENCOUNTER — Ambulatory Visit: Payer: Medicare Other | Admitting: Physical Therapy

## 2020-09-24 DIAGNOSIS — I69354 Hemiplegia and hemiparesis following cerebral infarction affecting left non-dominant side: Secondary | ICD-10-CM | POA: Diagnosis not present

## 2020-09-24 DIAGNOSIS — R42 Dizziness and giddiness: Secondary | ICD-10-CM | POA: Diagnosis not present

## 2020-09-24 DIAGNOSIS — E876 Hypokalemia: Secondary | ICD-10-CM

## 2020-09-24 DIAGNOSIS — R2681 Unsteadiness on feet: Secondary | ICD-10-CM | POA: Diagnosis not present

## 2020-09-24 DIAGNOSIS — R2689 Other abnormalities of gait and mobility: Secondary | ICD-10-CM

## 2020-09-24 DIAGNOSIS — M6281 Muscle weakness (generalized): Secondary | ICD-10-CM | POA: Diagnosis not present

## 2020-09-24 LAB — BASIC METABOLIC PANEL
BUN/Creatinine Ratio: 13 (ref 12–28)
BUN: 15 mg/dL (ref 8–27)
CO2: 26 mmol/L (ref 20–29)
Calcium: 9.9 mg/dL (ref 8.7–10.3)
Chloride: 102 mmol/L (ref 96–106)
Creatinine, Ser: 1.15 mg/dL — ABNORMAL HIGH (ref 0.57–1.00)
GFR calc Af Amer: 56 mL/min/{1.73_m2} — ABNORMAL LOW (ref >59)
GFR calc non Af Amer: 49 mL/min/{1.73_m2} — ABNORMAL LOW (ref >59)
Glucose: 82 mg/dL (ref 65–99)
Potassium: 4.2 mmol/L (ref 3.5–5.2)
Sodium: 143 mmol/L (ref 134–144)

## 2020-09-24 MED ORDER — CLONIDINE HCL 0.1 MG PO TABS: ORAL_TABLET | ORAL | 0 refills | Status: DC

## 2020-09-24 MED ORDER — SPIRONOLACTONE 25 MG PO TABS: 25.0000 mg | ORAL_TABLET | Freq: Every day | ORAL | 11 refills | Status: DC

## 2020-09-24 NOTE — Telephone Encounter (Signed)
Called patient to inform her that lab results were normal. Also had the patient confirm the strength of clonidine that she was taking to be 0.3mg  at night & her potassium to be 10 meq once daily. Called in prescription to [facility] for 0.1mg  strength tablets of clonidine and requested that the patient taper off the medication as follows: clonidine 0.2mg  x 4 days, 0.1mg  x 4 days. The patient was also told to stop taking potassium.  Given the patient's previous BP control on spironolactone 12.5mg  BID w/ stable BMET, instructed the patient to take spironolactone 25 mg daily.   F/U scheduled for November 12th at 01:30. Will get repeat BMET at this time.

## 2020-09-24 NOTE — Telephone Encounter (Signed)
Done   Noted

## 2020-09-25 NOTE — Therapy (Signed)
[facility] [address] [place], [place], [address] Phone: [phone]   Fax:  [phone]  Physical Therapy Treatment  Patient Details  Name: Chelsea [name] MRN: [id number] Date of Birth: 1950/12/19 Referring Provider (PT): Star Age, MD   Encounter Date: 09/24/2020   PT End of Session - 09/24/20 1409    Visit Number 4    Number of Visits 17    Date for PT Re-Evaluation 12/10/20   written for 60 day POC   Authorization Type [employer] Medicare & Medicaid    PT Start Time 1402    PT Stop Time 1444    PT Time Calculation (min) 42 min    Equipment Utilized During Treatment Gait belt    Activity Tolerance Patient tolerated treatment well   pt chatty at times - needing to be redirected to the task at hand   Behavior During Therapy [facility] for tasks assessed/performed           Past Medical History:  Diagnosis Date  . Anxiety   . Cerebrovascular accident [facility])    a. 2007-->residual left sided weakness  . Chest pain   . Depression   . GERD (gastroesophageal reflux disease)   . Headache   . Hx of hysterectomy   . Hyperlipidemia   . Hypertension   . Mild to Moderate Aortic Insufficiency    a. 08/2014 Echo: EF 55-60%, mild conc LVH, no rwma, Gr 1 DD, mild-mod AI, mild MR.  . Morbid obesity ([place])   . Obesity   . Peptic ulcer disease     Past Surgical History:  Procedure Laterality Date  . ABDOMINAL HYSTERECTOMY    . TOTAL KNEE ARTHROPLASTY Left 01/05/2016   Procedure: TOTAL KNEE ARTHROPLASTY;  Surgeon: Frederik Pear, MD;  Location: [facility];  Service: Orthopedics;  Laterality: Left;  . TUBAL LIGATION      There were no vitals filed for this visit.   Subjective Assessment - 09/24/20 1407    Subjective No new complaints. No falls. Asking about OT and [facility] consults. OT order is in, will get the eval scheduled. [facility] consult is set for 10/03/2020. No pain to report.    Pertinent History Rt CVA in 2007 with Lt  hemiparesis;  anxiety, depression, mild to moderate aortic insufficiency,  arthritis with s/p L TKA in 2/16, thalamic pain syndrome    Limitations Walking;House hold activities    How long can you sit comfortably? No limitation    How long can you stand comfortably? No limitation    How long can you walk comfortably? No limitation    Patient Stated Goals wants to get strength back in arms and legs.    Currently in Pain? No/denies    Pain Score 0-No pain                [facility] Adult PT Treatment/Exercise - 09/24/20 1411      Transfers   Transfers Sit to Stand;Stand to Sit    Sit to Stand 5: Supervision    Stand to Sit 5: Supervision      Ambulation/Gait   Ambulation/Gait Yes    Ambulation/Gait Assistance 4: Min guard    Ambulation/Gait Assistance Details around the gym with session    Assistive device Small based quad cane    Gait Pattern Step-through pattern;Decreased arm swing - left;Decreased step length - right;Decreased stance time - left;Decreased hip/knee flexion - left;Decreased dorsiflexion - left;Decreased weight shift to left;Left genu recurvatum;Poor foot clearance - left;Left  foot flat    Ambulation Surface Level;Indoor      Neuro Re-ed    Neuro Re-ed Details  for balance/muscle re-ed: standing with right foot forward on 4 inch step- left single leg mini squats for 10 reps with single UE support for balance, PTA guarding knee to prevent hyperextension.       Knee/Hip Exercises: Aerobic   Other Aerobic Scifit UE/LE level 2.5 for 5 minutes with goal 50-60 rpm for strengthening/activity tolerance      Knee/Hip Exercises: Standing   Terminal Knee Extension AROM;Strengthening;Left;1 set;10 reps;Theraband;Limitations    Theraband Level (Terminal Knee Extension) Level 3 (Green)    Terminal Knee Extension Limitations cues/assist for posture and correct ex form/techique with single UE support    Other Standing Knee Exercises sit<>stands with feet in staggered position with  right foot forward for 10 reps, assist needed for knee controll and to maintain left LE in correct position/alignment.                Balance Exercises - 09/24/20 1428      Balance Exercises: Standing   Standing Eyes Closed Wide (BOA);Foam/compliant surface;Other reps (comment);30 secs;Limitations;Head turns    Standing Eyes Closed Limitations on airex no UE support with feet hip width apart: EC 30 sec's x 3 reps min guard to min assist for balance. progressed to feet wide apart for EC head movements left<>right, up<>down for ~10 reps each with min assist for balance with increased postural sway.                PT Short Term Goals - 09/11/20 1216      PT SHORT TERM GOAL #1   Title Pt will undergo further assessment of BERG -LTG written as appropriate. ALL STGS DUE 10/09/20    Time 4    Period Weeks    Status New    Target Date 10/09/20      PT SHORT TERM GOAL #2   Title Pt will undergo further assessment of AFO for improved gait mechanics.    Time 4    Period Weeks    Status New      PT SHORT TERM GOAL #3   Title Pt will improve gait sped to at least 1.8 ft/sec with small base quad in order to decr fall risk.    Baseline 1.58 ft/sec    Time 4    Period Weeks    Status New      PT SHORT TERM GOAL #4   Title Pt will decr TUG to 17 seconds or less with small based quad cane in order to demo decr fall risk.    Baseline 19.09 seconds    Time 4    Period Weeks    Status New      PT SHORT TERM GOAL #5   Title Pt will improve 5x sit <> stand time with no UE support in 17 seconds or less in order to demo improved BLE strength.    Baseline 20.22 seconds with no UE support    Time 4    Period Weeks    Status New             PT Long Term Goals - 09/11/20 1221      PT LONG TERM GOAL #1   Title Pt will be independent with final HEP in order to build upon functional gains made in therapy. ALL LTGS DUE 11/06/20    Time 8    Period Weeks  Status New    Target  Date 11/06/20      PT LONG TERM GOAL #2   Title BERG goal to be written as appropriate to decr fall risk.    Time 8    Period Weeks    Status New      PT LONG TERM GOAL #3   Title Pt will improve gait sped to at least 2.2 ft/sec with small base quad in order to improve gait efficiency.    Baseline 1.58 ft/sec    Time 8    Period Weeks    Status New      PT LONG TERM GOAL #4   Title Pt will decr TUG to 15 seconds or less with small based quad cane in order to demo decr fall risk.    Baseline 19.09 seconds    Time 8    Period Weeks    Status New      PT LONG TERM GOAL #5   Title Pt will ambulate at least 300' over indoor and outdoor unlevel surfaces with supervision with small base quad cane in order to improve functional mobility.    Time 8    Period Weeks    Status New                 Plan - 09/24/20 1409    Clinical Impression Statement Today's skilled session continued to focus on LE strengthening and balance training with rest breaks taken as needed due to fatigue. Pt was informed of when her orthotic consult with [facility] is scheduled for (10/03/2020). The OT order came in as well with an OT eval scheduled today as well. The pt is progressing toward goals and should benefit from continued PT to progress toward unmet goals.    Personal Factors and Comorbidities Time since onset of injury/illness/exacerbation;Past/Current Experience;Fitness    Comorbidities h/o Rt CVA in 2007; anxiety & depression, HTN, mild to moderate aortic insufficiency    Examination-Activity Limitations Squat;Transfers;Stand;Locomotion Level    Examination-Participation Restrictions Estate agent;Shop    Stability/Clinical Decision Making Stable/Uncomplicated    Rehab Potential Good    PT Frequency 2x / week    PT Duration 8 weeks    PT Treatment/Interventions ADLs/Self Care Home Management;Therapeutic exercise;Therapeutic activities;Patient/family education;Passive range of  motion;DME Instruction;Gait training;Functional mobility training;Stair training;Balance training;Neuromuscular re-education;Orthotic Fit/Training;Vestibular;Energy conservation    PT Next Visit Plan Gerald Stabs will be present at 11/5 appt. standing balance with UE support (add to HEP as needed), continue to work on LE strengthening    Consulted and Agree with Plan of Care Patient           Patient will benefit from skilled therapeutic intervention in order to improve the following deficits and impairments:  Decreased strength, Decreased range of motion, Decreased activity tolerance, Abnormal gait, Decreased balance, Decreased coordination, Difficulty walking, Dizziness, Impaired sensation, Impaired tone  Visit Diagnosis: Unsteadiness on feet  Muscle weakness (generalized)  Other abnormalities of gait and mobility     Problem List Patient Active Problem List   Diagnosis Date Noted  . 54 04/03/2020  . Nutritional counseling 01/19/2018  . Primary osteoarthritis of left knee 01/03/2016  . Mild to Moderate Aortic Insufficiency   . Hypertension   . Hyperlipidemia   . Morbid obesity ([place])   . Pulmonary hypertension ([place]) 01/30/2013  . Orthostatic hypotension 12/18/2012  . Aortic valve disorder 07/29/2010  . CHEST PAIN-PRECORDIAL 07/29/2010  . CYST OF THYROID 01/06/2010  . HYPOKALEMIA 01/06/2010  . HYPERLIPIDEMIA 12/24/2009  .  OBESITY 12/24/2009  . HYPERTENSION, UNSPECIFIED 12/24/2009  . Personal history of unspecified circulatory disease 12/24/2009  . PEPTIC ULCER DISEASE, HX OF 12/24/2009    Willow Ora, PTA, [facility] [address] [place], [place] [phone] 09/25/20, 9:29 PM   Name: Adore Kithcart MRN: [id number] Date of Birth: 1951-11-05

## 2020-09-29 ENCOUNTER — Other Ambulatory Visit: Payer: Self-pay

## 2020-09-29 ENCOUNTER — Ambulatory Visit: Payer: Medicare Other | Admitting: Physical Therapy

## 2020-09-29 VITALS — BP 210/113 | HR 84

## 2020-09-29 DIAGNOSIS — I69354 Hemiplegia and hemiparesis following cerebral infarction affecting left non-dominant side: Secondary | ICD-10-CM | POA: Insufficient documentation

## 2020-09-29 DIAGNOSIS — R2681 Unsteadiness on feet: Secondary | ICD-10-CM

## 2020-09-29 DIAGNOSIS — R278 Other lack of coordination: Secondary | ICD-10-CM | POA: Insufficient documentation

## 2020-09-29 DIAGNOSIS — M6281 Muscle weakness (generalized): Secondary | ICD-10-CM | POA: Insufficient documentation

## 2020-09-29 DIAGNOSIS — R2689 Other abnormalities of gait and mobility: Secondary | ICD-10-CM | POA: Insufficient documentation

## 2020-09-29 DIAGNOSIS — I69854 Hemiplegia and hemiparesis following other cerebrovascular disease affecting left non-dominant side: Secondary | ICD-10-CM | POA: Insufficient documentation

## 2020-09-29 DIAGNOSIS — R41840 Attention and concentration deficit: Secondary | ICD-10-CM | POA: Insufficient documentation

## 2020-09-29 NOTE — Therapy (Signed)
[facility] [address] [place], [place], [address] Phone: [phone]   Fax:  [phone]  Physical Therapy Treatment - Arrive No Charge   Patient Details  Name: Chelsea [name] MRN: [id number] Date of Birth: May 14, 1951 Referring Provider (PT): Star Age, MD   Encounter Date: 09/29/2020   PT End of Session - 09/29/20 1440    Visit Number 4   arrived no charge   Number of Visits 17    Date for PT Re-Evaluation 12/10/20   written for 60 day POC   Authorization Type UHC Medicare & Medicaid    PT Start Time 1405    PT Stop Time 1430    PT Time Calculation (min) 25 min    Equipment Utilized During Treatment Gait belt    Activity Tolerance --   limited by high BP          Past Medical History:  Diagnosis Date  . Anxiety   . Cerebrovascular accident [facility])    a. 2007-->residual left sided weakness  . Chest pain   . Depression   . GERD (gastroesophageal reflux disease)   . Headache   . Hx of hysterectomy   . Hyperlipidemia   . Hypertension   . Mild to Moderate Aortic Insufficiency    a. 08/2014 Echo: EF 55-60%, mild conc LVH, no rwma, Gr 1 DD, mild-mod AI, mild MR.  . Morbid obesity ([place])   . Obesity   . Peptic ulcer disease     Past Surgical History:  Procedure Laterality Date  . ABDOMINAL HYSTERECTOMY    . TOTAL KNEE ARTHROPLASTY Left 01/05/2016   Procedure: TOTAL KNEE ARTHROPLASTY;  Surgeon: Frederik Pear, MD;  Location: [facility];  Service: Orthopedics;  Laterality: Left;  . TUBAL LIGATION      Vitals:   09/29/20 1411 09/29/20 1420  BP: (!) 182/113 (!) 210/113  Pulse: 84      Subjective Assessment - 09/29/20 1441    Subjective Had a fall yesterday - was able to get up on her own. Also reports feeling nauseous and dizzy yesterday. Walking in with a rollator today due to recent fall.    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 No limitation    How long can you stand comfortably? No limitation    How long can you walk comfortably? No limitation    Patient Stated Goals wants to get strength back in arms and legs.                 Pt arrives to therapy reporting she had a fall yesterday and was feeling dizzy/naseous. Pt also reporting feeling nauseous earlier today.  Assessed pt's BP with automatic cuff at 182/113. Checked BP again with manual cuff at 210/113. When asking pt if she has any signs/symptoms of a CVA right now, pt reports that she feels totally fine and that her BP is increased because she was on her feet all day and busy earlier. Therapist educating pt on the signs/symptoms of a CVA and that with pt's elevated BP and hx of a CVA that she would need to go to the ER immediately. Pt adamantly refusing to go to the ER at this time. Pt was driven to therapy by transportation services, PT asking if  possible to speak to the driver to see if possible to go to the ER to avoid taking an ambulance, pt still strongly adamant about not going. Continued to educate pt on signs and symptoms of a CVA and provided handout. Continued to strongly advise pt to go to the ER. Pt reports she will have transportation take her home and if she is starting to feel worse later, then will have her ex-husband take her to the emergency room. PT walked pt out to the waiting room to wait for transportation to take her home. PT called pt's PCP office to let them know of the situation and to reach out to pt to schedule an appt due to HTN.                        PT Short Term Goals - 09/11/20 1216      PT SHORT TERM GOAL #1   Title Pt will undergo further assessment of BERG -LTG written as appropriate. ALL STGS DUE 10/09/20    Time 4    Period Weeks    Status New    Target Date  10/09/20      PT SHORT TERM GOAL #2   Title Pt will undergo further assessment of AFO for improved gait mechanics.    Time 4    Period Weeks    Status New      PT SHORT TERM GOAL #3   Title Pt will improve gait sped to at least 1.8 ft/sec with small base quad in order to decr fall risk.    Baseline 1.58 ft/sec    Time 4    Period Weeks    Status New      PT SHORT TERM GOAL #4   Title Pt will decr TUG to 17 seconds or less with small based quad cane in order to demo decr fall risk.    Baseline 19.09 seconds    Time 4    Period Weeks    Status New      PT SHORT TERM GOAL #5   Title Pt will improve 5x sit <> stand time with no UE support in 17 seconds or less in order to demo improved BLE strength.    Baseline 20.22 seconds with no UE support    Time 4    Period Weeks    Status New             PT Long Term Goals - 09/11/20 1221      PT LONG TERM GOAL #1   Title Pt will be independent with final HEP in order to build upon functional gains made in therapy. ALL LTGS DUE 11/06/20    Time 8    Period Weeks    Status New    Target Date 11/06/20      PT LONG TERM GOAL #2   Title BERG goal to be written as appropriate to decr fall risk.    Time 8    Period Weeks    Status New      PT LONG TERM GOAL #3   Title Pt will improve gait sped to at least 2.2 ft/sec with small base quad in order to improve gait efficiency.    Baseline 1.58 ft/sec    Time 8    Period Weeks    Status New      PT LONG TERM GOAL #4   Title Pt will decr TUG to 15 seconds or less  with small based quad cane in order to demo decr fall risk.    Baseline 19.09 seconds    Time 8    Period Weeks    Status New      PT LONG TERM GOAL #5   Title Pt will ambulate at least 300' over indoor and outdoor unlevel surfaces with supervision with small base quad cane in order to improve functional mobility.    Time 8    Period Weeks    Status New                  Patient will benefit from skilled  therapeutic intervention in order to improve the following deficits and impairments:     Visit Diagnosis: Unsteadiness on feet     Problem List Patient Active Problem List   Diagnosis Date Noted  . 53 04/03/2020  . Nutritional counseling 01/19/2018  . Primary osteoarthritis of left knee 01/03/2016  . Mild to Moderate Aortic Insufficiency   . Hypertension   . Hyperlipidemia   . Morbid obesity ([place])   . Pulmonary hypertension ([place]) 01/30/2013  . Orthostatic hypotension 12/18/2012  . Aortic valve disorder 07/29/2010  . CHEST PAIN-PRECORDIAL 07/29/2010  . CYST OF THYROID 01/06/2010  . HYPOKALEMIA 01/06/2010  . HYPERLIPIDEMIA 12/24/2009  . OBESITY 12/24/2009  . HYPERTENSION, UNSPECIFIED 12/24/2009  . Personal history of unspecified circulatory disease 12/24/2009  . PEPTIC ULCER DISEASE, HX OF 12/24/2009    Arliss Journey, PT, DPT  09/29/2020, 2:42 PM  [facility] [address] [place], [place], [address] Phone: [phone]   Fax:  [phone]  Name: Chelsea [name] MRN: [id number] Date of Birth: 1951-09-10

## 2020-09-29 NOTE — Patient Instructions (Signed)
Warning Signs of a Stroke  A stroke is a medical emergency and should be treated right away--every second counts. A stroke is caused by a decrease or block in blood flow to the brain. When this occurs, certain areas of the brain do not get enough oxygen, and brain cells begin to die. A stroke can lead to brain damage and can sometimes be life-threatening. However, if someone having a stroke gets medical treatment right away, he or she has better chances of surviving and recovering from the stroke. Being able to recognize the symptoms of a stroke is very important. Types of strokes There are two main types of strokes:  Ischemic strokes. This is the most common type of stroke. These strokes happen when a blood vessel that supplies blood to the brain is being blocked.  Hemorrhagic strokes. These strokes result from bleeding in the brain due to a blood vessel leaking or bursting (rupturing). A transient ischemic attack (TIA) is a "warning stroke" that causes stroke-like symptoms that go away quickly. Unlike a stroke, a TIA does not cause permanent damage to the brain. However, the symptoms of a TIA are the same as a stroke, and they also require medical treatment right away. Having a TIA is a sign that you are at higher risk for a permanent stroke. Warning signs of a stroke The symptoms of stroke may vary and will reflect the part of the brain that is involved. Symptoms usually happen suddenly. "BE FAST" is an easy way to remember the main warning signs of a stroke. B - Balance Signs are dizziness, sudden trouble walking, or loss of balance. E - Eyes Signs are trouble seeing or a sudden change in vision. F - Face Signs are sudden weakness or numbness of the face, or the face or eyelid drooping on one side. A - Arms Signs are weakness or numbness in an arm. This happens suddenly and usually on one side of the body. S - Speech Signs are sudden trouble speaking, slurred speech, or trouble  understanding what people say. T - Time Time to call emergency services. Write down what time symptoms started. Other signs of a stroke Some less common signs of a stroke include:  A sudden, severe headache with no known cause.  Nausea or vomiting.  Seizure. A stroke may be happening even if only one "BE FAST" symptoms is present. These symptoms may represent a serious problem that is an emergency. Do not wait to see if the symptoms will go away. Get medical help right away. Call your local emergency services (911 in the [place] Do not drive yourself to the hospital. Summary  A stroke is a medical emergency and should be treated right away--every second counts.  "BE FAST" is an easy way to remember the main warning signs of a stroke.  Call local emergency services right away if you or someone else has any stroke symptoms, even if the symptoms go away.  Make note of what time the first symptoms appeared. Emergency responders or emergency room staff will need to know this information.  Do not wait to see if symptoms will go away. Call 911 even if only one of the "BE FAST" symptoms appears. This information is not intended to replace advice given to you by your health care provider. Make sure you discuss any questions you have with your health care provider. Document Revised: 10/28/2017 Document Reviewed: 03/04/2017 Elsevier Patient Education  2020 Elsevier Inc.  

## 2020-10-01 ENCOUNTER — Other Ambulatory Visit: Payer: Medicare Other

## 2020-10-03 ENCOUNTER — Other Ambulatory Visit: Payer: Self-pay

## 2020-10-03 ENCOUNTER — Ambulatory Visit: Payer: Medicare Other | Attending: Family Medicine | Admitting: Physical Therapy

## 2020-10-03 ENCOUNTER — Ambulatory Visit: Payer: Medicare Other | Admitting: Occupational Therapy

## 2020-10-03 ENCOUNTER — Encounter: Payer: Self-pay | Admitting: Physical Therapy

## 2020-10-03 VITALS — BP 147/80 | HR 69

## 2020-10-03 DIAGNOSIS — I69854 Hemiplegia and hemiparesis following other cerebrovascular disease affecting left non-dominant side: Secondary | ICD-10-CM | POA: Diagnosis not present

## 2020-10-03 DIAGNOSIS — R278 Other lack of coordination: Secondary | ICD-10-CM | POA: Diagnosis not present

## 2020-10-03 DIAGNOSIS — R2681 Unsteadiness on feet: Secondary | ICD-10-CM | POA: Diagnosis not present

## 2020-10-03 DIAGNOSIS — R2689 Other abnormalities of gait and mobility: Secondary | ICD-10-CM | POA: Diagnosis not present

## 2020-10-03 DIAGNOSIS — R41840 Attention and concentration deficit: Secondary | ICD-10-CM | POA: Diagnosis present

## 2020-10-03 DIAGNOSIS — I69354 Hemiplegia and hemiparesis following cerebral infarction affecting left non-dominant side: Secondary | ICD-10-CM

## 2020-10-03 DIAGNOSIS — M6281 Muscle weakness (generalized): Secondary | ICD-10-CM

## 2020-10-03 NOTE — Therapy (Signed)
[facility] [address] [place], [place], [address] Phone: [phone]   Fax:  [phone]  Physical Therapy Treatment  Patient Details  Name: Chelsea [name] MRN: [id number] Date of Birth: 10-10-1951 Referring Provider (PT): Star Age, MD   Encounter Date: 10/03/2020   PT End of Session - 10/03/20 1627    Visit Number 5    Number of Visits 17    Date for PT Re-Evaluation 12/10/20   written for 60 day POC   Authorization Type UHC Medicare & Medicaid    PT Start Time 1404    PT Stop Time 1445    PT Time Calculation (min) 41 min    Equipment Utilized During Treatment Gait belt    Activity Tolerance Patient tolerated treatment well    Behavior During Therapy [facility] for tasks assessed/performed           Past Medical History:  Diagnosis Date  . Anxiety   . Cerebrovascular accident [facility])    a. 2007-->residual left sided weakness  . Chest pain   . Depression   . GERD (gastroesophageal reflux disease)   . Headache   . Hx of hysterectomy   . Hyperlipidemia   . Hypertension   . Mild to Moderate Aortic Insufficiency    a. 08/2014 Echo: EF 55-60%, mild conc LVH, no rwma, Gr 1 DD, mild-mod AI, mild MR.  . Morbid obesity ([place])   . Obesity   . Peptic ulcer disease     Past Surgical History:  Procedure Laterality Date  . ABDOMINAL HYSTERECTOMY    . TOTAL KNEE ARTHROPLASTY Left 01/05/2016   Procedure: TOTAL KNEE ARTHROPLASTY;  Surgeon: Frederik Pear, MD;  Location: [facility];  Service: Orthopedics;  Laterality: Left;  . TUBAL LIGATION      Vitals:   10/03/20 1411  BP: (!) 147/80  Pulse: 69     Subjective Assessment - 10/03/20 1407    Subjective Didn't go to the ER the other day. Realized that she forgot to take 2 of her BP pills. Is feeling much better today. Took all of her BP pills today.    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 No limitation    How long can you stand comfortably? No limitation    How long can you walk comfortably? No limitation    Patient Stated Goals wants to get strength back in arms and legs.    Currently in Pain? No/denies                             [facility] Adult PT Treatment/Exercise - 10/03/20 0001      Ambulation/Gait   Ambulation/Gait Yes    Ambulation/Gait Assistance 5: Supervision    Ambulation/Gait Assistance Details orthotist chris present today to watch patient walk for AFO, after assessment orthotist recommending custom articulating AFO with DF assist to help with foot clearance and to decr excessive pronation    Ambulation Distance (Feet) 115 Feet    Assistive device Other (Comment)   rollator   Gait Pattern Step-through pattern;Decreased step length - right;Decreased stance time - left;Decreased hip/knee flexion - left;Decreased dorsiflexion - left;Decreased weight shift to left;Left genu recurvatum;Poor foot clearance - left;Left foot flat  incr pronation on L   Ambulation Surface Level;Indoor    Gait Comments --      Therapeutic Activites    Therapeutic Activities Other Therapeutic Activities    Other Therapeutic Activities reviewed signs/sx of a CVA at start of session (due to pt's request because of pt's elevated BP last session due to pt forgetting to take her medication) - discussed with pt looking into having a pill box for home to mange medications, pt reports she thinks she has one at home. discussed with orthotist process of obtaining a brace: PT will need to send  request to pt's MD about AFO and pt will also need a face to face/virtual visit with physician for documented need of why pt would need an AFO. provided info to pt for her to call her neurologist's office (as they referred her here) in order to make an appt.        Exercises   Exercises Other Exercises    Other Exercises  at beginning of session, verbally reviewed HEP as pt demonstrating exercises not on HEP and performing exercises too quickly, will formally review at a later session with pt demo                  PT Education - 10/03/20 1626    Education Details see TA.    Person(s) Educated Patient    Methods Explanation    Comprehension Verbalized understanding            PT Short Term Goals - 09/11/20 1216      PT SHORT TERM GOAL #1   Title Pt will undergo further assessment of BERG -LTG written as appropriate. ALL STGS DUE 10/09/20    Time 4    Period Weeks    Status New    Target Date 10/09/20      PT SHORT TERM GOAL #2   Title Pt will undergo further assessment of AFO for improved gait mechanics.    Time 4    Period Weeks    Status New      PT SHORT TERM GOAL #3   Title Pt will improve gait sped to at least 1.8 ft/sec with small base quad in order to decr fall risk.    Baseline 1.58 ft/sec    Time 4    Period Weeks    Status New      PT SHORT TERM GOAL #4   Title Pt will decr TUG to 17 seconds or less with small based quad cane in order to demo decr fall risk.    Baseline 19.09 seconds    Time 4    Period Weeks    Status New      PT SHORT TERM GOAL #5   Title Pt will improve 5x sit <> stand time with no UE support in 17 seconds or less in order to demo improved BLE strength.    Baseline 20.22 seconds with no UE support    Time 4    Period Weeks    Status New             PT Long Term Goals - 09/11/20 1221      PT LONG TERM GOAL #1   Title Pt will be independent with final HEP in order to build upon functional gains made in therapy. ALL LTGS DUE 11/06/20    Time 8    Period Weeks    Status New    Target Date 11/06/20  PT LONG TERM GOAL #2   Title BERG goal to be written as appropriate to decr fall risk.    Time 8    Period Weeks    Status New      PT LONG TERM GOAL #3   Title Pt  will improve gait sped to at least 2.2 ft/sec with small base quad in order to improve gait efficiency.    Baseline 1.58 ft/sec    Time 8    Period Weeks    Status New      PT LONG TERM GOAL #4   Title Pt will decr TUG to 15 seconds or less with small based quad cane in order to demo decr fall risk.    Baseline 19.09 seconds    Time 8    Period Weeks    Status New      PT LONG TERM GOAL #5   Title Pt will ambulate at least 300' over indoor and outdoor unlevel surfaces with supervision with small base quad cane in order to improve functional mobility.    Time 8    Period Weeks    Status New                 Plan - 10/03/20 1628    Clinical Impression Statement Pt's BP WFL during session today. Orthotist, Gerald Stabs, from [facility] present during session today to assess pt's gait for AFO. Orthotist recommending a custom articulating short AFO with DF assist that will help decr pronation. PT to send note to MD about order for brace to continue process with brace. Will continue to progress towards LTGs.    Personal Factors and Comorbidities Time since onset of injury/illness/exacerbation;Past/Current Experience;Fitness    Comorbidities h/o Rt CVA in 2007; anxiety & depression, HTN, mild to moderate aortic insufficiency    Examination-Activity Limitations Squat;Transfers;Stand;Locomotion Level    Examination-Participation Restrictions Estate agent;Shop    Stability/Clinical Decision Making Stable/Uncomplicated    Rehab Potential Good    PT Frequency 2x / week    PT Duration 8 weeks    PT Treatment/Interventions ADLs/Self Care Home Management;Therapeutic exercise;Therapeutic activities;Patient/family education;Passive range of motion;DME Instruction;Gait training;Functional mobility training;Stair training;Balance training;Neuromuscular re-education;Orthotic Fit/Training;Vestibular;Energy conservation    PT Next Visit Plan STGs due next week. standing balance with UE  support (add to HEP as needed), continue to work on LE Radiation protection practitioner and Agree with Plan of Care Patient           Patient will benefit from skilled therapeutic intervention in order to improve the following deficits and impairments:  Decreased strength, Decreased range of motion, Decreased activity tolerance, Abnormal gait, Decreased balance, Decreased coordination, Difficulty walking, Dizziness, Impaired sensation, Impaired tone  Visit Diagnosis: Muscle weakness (generalized)  Unsteadiness on feet  Other abnormalities of gait and mobility  Hemiparesis affecting left side as late effect of cerebrovascular accident [facility])     Problem List Patient Active Problem List   Diagnosis Date Noted  . 54 04/03/2020  . Nutritional counseling 01/19/2018  . Primary osteoarthritis of left knee 01/03/2016  . Mild to Moderate Aortic Insufficiency   . Hypertension   . Hyperlipidemia   . Morbid obesity ([place])   . Pulmonary hypertension ([place]) 01/30/2013  . Orthostatic hypotension 12/18/2012  . Aortic valve disorder 07/29/2010  . CHEST PAIN-PRECORDIAL 07/29/2010  . CYST OF THYROID 01/06/2010  . HYPOKALEMIA 01/06/2010  . HYPERLIPIDEMIA 12/24/2009  . OBESITY 12/24/2009  . HYPERTENSION, UNSPECIFIED 12/24/2009  . Personal history of unspecified circulatory disease  12/24/2009  . PEPTIC ULCER DISEASE, HX OF 12/24/2009    Arliss Journey, PT, DPT  10/03/2020, 4:34 PM  [facility] [address] [place], [place], [address] Phone: [phone]   Fax:  [phone]  Name: Chelsea [name] MRN: [id number] Date of Birth: 29-Apr-1951

## 2020-10-06 ENCOUNTER — Telehealth: Payer: Self-pay | Admitting: Physical Therapy

## 2020-10-06 ENCOUNTER — Ambulatory Visit: Payer: Medicare Other | Admitting: Occupational Therapy

## 2020-10-06 ENCOUNTER — Telehealth: Payer: Self-pay | Admitting: Neurology

## 2020-10-06 ENCOUNTER — Other Ambulatory Visit: Payer: Self-pay

## 2020-10-06 ENCOUNTER — Encounter: Payer: Self-pay | Admitting: Physical Therapy

## 2020-10-06 ENCOUNTER — Encounter: Payer: Self-pay | Admitting: Occupational Therapy

## 2020-10-06 ENCOUNTER — Ambulatory Visit: Payer: Medicare Other | Admitting: Physical Therapy

## 2020-10-06 VITALS — BP 147/81 | HR 64

## 2020-10-06 DIAGNOSIS — R2681 Unsteadiness on feet: Secondary | ICD-10-CM

## 2020-10-06 DIAGNOSIS — I69854 Hemiplegia and hemiparesis following other cerebrovascular disease affecting left non-dominant side: Secondary | ICD-10-CM

## 2020-10-06 DIAGNOSIS — R41840 Attention and concentration deficit: Secondary | ICD-10-CM

## 2020-10-06 DIAGNOSIS — M6281 Muscle weakness (generalized): Secondary | ICD-10-CM | POA: Diagnosis not present

## 2020-10-06 DIAGNOSIS — R2689 Other abnormalities of gait and mobility: Secondary | ICD-10-CM

## 2020-10-06 DIAGNOSIS — R278 Other lack of coordination: Secondary | ICD-10-CM

## 2020-10-06 DIAGNOSIS — I69398 Other sequelae of cerebral infarction: Secondary | ICD-10-CM

## 2020-10-06 DIAGNOSIS — I69354 Hemiplegia and hemiparesis following cerebral infarction affecting left non-dominant side: Secondary | ICD-10-CM | POA: Diagnosis not present

## 2020-10-06 NOTE — Telephone Encounter (Signed)
See other telephone note from 10/06/20.

## 2020-10-06 NOTE — Telephone Encounter (Signed)
Dr. Rexene Alberts, Chelsea [name] has been seen by physical therapy at [facility].  The patient would benefit from a custom L AFO to improve gait mechanics and decr fall risk from R CVA. She will also be reaching out to schedule an appointment with you for a face to face visit in order to have written documentation for need of a L AFO.   If you agree, please place an order in [facility]que in [facility] or fax the order to [phone].  Thank you, Janann August, PT, DPT 10/06/20 7:49 AM    [name] [address] [place], [place]  [address] Phone:  [phone] Fax:  [phone]

## 2020-10-06 NOTE — Progress Notes (Signed)
Patient ID: Chelsea [name]                 DOB: 03/11/1951                      MRN: [id number]     HPI: Chelsea [name] is a 69 y.o. female referred by Dr. Corey Skains HTN clinic. PMH is significant for mild aortic regurgitation, non-cardiac chest pain, hx stroke and residual left-sided weakness, HLD, HTN, and moderate obesity.   Pt last seen by clinical pharmacy on 09/23/20 at which BP was near goal at 132/88 and HR 58. Reported taking spironolactone 12.5 mg BID instead of once daily as prescribed. Home BP readings near goal 120/85 - 129/80 (AM & PM readings), therefore instructed patient to start taking spironolactone 25 mg - 1 tablet daily in the mornings. Also, confirmed patient was still taking clonidine 0.3 mg QHS and potassium 10 mEq daily. New prescription for 0.1mg  strength tablets of clonidine sent to pharmacy and instructed patient to taper off clonidine as follows: clonidine 0.2mg  x 4 days, 0.1mg  x 4 days. The patient was also told to stop taking potassium.  Patient presents today in good spirits. Reports medication adherence with all hypertension and HF medications except for doxasozin. Reports running out of refills for doxazosin 1 month ago. Also reports completing clonidine taper. Patient did not bring BP log, however reports last BP checked was 132/86 on Sunday. Denies dizziness, headaches, blurred vision and swelling.  Current HTN meds: amlodipine/valsartan (Exforge) 10/320 mg daily (AM), metoprolol succinate 100 mg daily (PM), doxazosin 8 mgQHS, indapamide 2.5 mg daily (AM), Spironolactone 25mg  daily (AM)   Previously tried:lisinopril 10 mg daily, bumetanide 1 mg daily BP goal:<130/80 mmHg   Family History:Cancer in her brother; Cirrhosis in her father; Drug abuse in her brother; Heart attack in an other family member; Hypertension in her mother; Peripheral vascular disease in her mother.  Social History:current smoker (has not had a cigarette in 3 days)  - Currently  smoking 1 pack/day (most 3 packs/day) - Provided Rx for nicotine 21 mg patches on 09/23/20  Diet:Does not add salt to food and uses Mrs DASH. 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  Past Medical History:  Diagnosis Date  . Anxiety   . Cerebrovascular accident [facility])    a. 2007-->residual left sided weakness  . Chest pain   . Depression   . GERD (gastroesophageal reflux disease)   . Headache   . Hx of hysterectomy   . Hyperlipidemia   . Hypertension   . Mild to Moderate Aortic Insufficiency    a. 08/2014 Echo: EF 55-60%, mild conc LVH, no rwma, Gr 1 DD, mild-mod AI, mild MR.  . Morbid obesity ([place])   . Obesity   .  Peptic ulcer disease     Current Outpatient Medications on File Prior to Visit  Medication Sig Dispense Refill  . amLODipine-valsartan (EXFORGE) 10-320 MG tablet Take 1 tablet by mouth daily.    Marland Kitchen aspirin EC 81 MG tablet Take 1 tablet (81 mg total) by mouth daily.    Marland Kitchen atorvastatin (LIPITOR) 10 MG tablet TAKE 1 TABLET BY MOUTH  DAILY 90 tablet 3  . chlorhexidine (PERIDEX) 0.12 % solution 15 mLs 2 (two) times daily.    . citalopram (CELEXA) 10 MG tablet Take 20 mg by mouth daily.     . cloNIDine (CATAPRES) 0.1 MG tablet Take 2 tablets daily for 4 days, then take 1 tablet daily for 4 days then STOP ALL CLONIDINE 12 tablet 0  .  cyanocobalamin 100 MCG tablet Take 100 mcg by mouth daily.     . cyclobenzaprine (FLEXERIL) 10 MG tablet Take 10 mg by mouth 3 (three) times daily as needed for muscle spasms.    Marland Kitchen doxazosin (CARDURA) 8 MG tablet Take 8 mg by mouth at bedtime.    Marland Kitchen ezetimibe (ZETIA) 10 MG tablet Take 10 mg by mouth daily.    . furosemide (LASIX) 20 MG tablet Take 20 mg by mouth as needed. (Patient not taking: Reported on 09/23/2020)    . gabapentin (NEURONTIN) 800 MG tablet Take 1 tablet (800 mg total) by mouth 2 (two) times daily. 180 tablet 3  . indapamide (LOZOL) 2.5 MG tablet Take 2.5 mg by mouth daily.    . meclizine (ANTIVERT) 25 MG tablet Take 25 mg by mouth 3 (three) times daily as needed.    . methocarbamol (ROBAXIN) 500 MG tablet Take 500-1,000 mg by mouth 3 (three) times daily as needed.    . metoprolol succinate (TOPROL-XL) 100 MG 24 hr tablet Take 1 tablet (100 mg total) by mouth daily. Take with or immediately following a meal. 30 tablet 11  . mirabegron ER (MYRBETRIQ) 25 MG TB24 tablet Take 25 mg by mouth daily.    . Multiple Vitamin (MULTIVITAMIN) tablet Take 1 tablet by mouth daily.      . nicotine (NICODERM CQ - DOSED IN MG/24 HOURS) 21 mg/24hr patch Place 1 patch (21 mg total) onto the skin daily. 28 patch 2  . nitroGLYCERIN (NITROSTAT) 0.4 MG SL tablet Place 1 tablet (0.4 mg total) under the tongue every 5 (five) minutes as needed for chest pain. 25 tablet 3  . pantoprazole (PROTONIX) 40 MG tablet Take 40 mg by mouth 2 (two) times daily.     Marland Kitchen spironolactone (ALDACTONE) 25 MG tablet Take 1 tablet (25 mg total) by mouth daily. 30 tablet 11  . tiZANidine (ZANAFLEX) 4 MG tablet Take 8 mg by mouth 3 (three) times daily as needed. (Patient not taking: Reported on 09/23/2020)    . traMADol (ULTRAM) 50 MG tablet Take 50 mg by mouth every 8 (eight) hours as needed.     No current facility-administered medications on file prior to visit.    Allergies  Allergen Reactions  . Other Swelling    Hair  dye, caused eye swelling, multiple times experienced this reaction  . Hydrocodone-Acetaminophen Nausea Only  . Oxycodone Nausea And Vomiting     Assessment/Plan:  1. Hypertension - BP above goal <130/80 mmHg and unable to assess home readings. At recent phyical therapy visits, BP was elevated at 147/81 and 147/80. Medication adherence appears optimal, however unsure if patient knows which medications she should and should not be taking. Requested patient bring  all medication bottles to next visit to conduct medication reconcilation. Patient verbalized understanding. Due to elevated BP today and at recent rehab visits, will plan to increase spironolactone to 50 mg daily pending BMET results drawn today. Instructed patient to not restart doxazosin, as we will focus on optimizing current medications first. Patient verbalized understanding. Discussed checking BP at least 1 hour after medications and to bring log and BP monitor to next visit. Follow-up visit scheduled in 2 weeks with labs.   Lorel Monaco, PharmD PGY2 [place] [address] [place],  [address] Phone: [phone]; Fax: [phone]

## 2020-10-06 NOTE — Telephone Encounter (Signed)
I called pt. I advised her that Dr. Rexene Alberts has ordered an AFO and we have sent the order to her PT. Pt verbalized understanding.

## 2020-10-06 NOTE — Telephone Encounter (Signed)
L AFO Order placed as per PT assessment.

## 2020-10-06 NOTE — Telephone Encounter (Signed)
Pt called, physical therapy needs an order for left custom AFO to improve safely walking and balance. Would like a call from the nurse.

## 2020-10-06 NOTE — Therapy (Signed)
[facility] [address] [place], [place], [address] Phone: [phone]   Fax:  [phone]  Physical Therapy Treatment  Patient Details  Name: Chelsea [name] MRN: [id number] Date of Birth: 30-Dec-1950 Referring Provider (PT): Star Age, MD   Encounter Date: 10/06/2020   PT End of Session - 10/06/20 1648    Visit Number 6    Number of Visits 17    Date for PT Re-Evaluation 12/10/20   written for 60 day POC   Authorization Type [employer] Medicare & Medicaid    PT Start Time 1403    PT Stop Time 1444    PT Time Calculation (min) 41 min    Equipment Utilized During Treatment Gait belt    Activity Tolerance Patient tolerated treatment well    Behavior During Therapy [facility] for tasks assessed/performed           Past Medical History:  Diagnosis Date  . Anxiety   . Cerebrovascular accident [facility])    a. 2007-->residual left sided weakness  . Chest pain   . Depression   . GERD (gastroesophageal reflux disease)   . Headache   . Hx of hysterectomy   . Hyperlipidemia   . Hypertension   . Mild to Moderate Aortic Insufficiency    a. 08/2014 Echo: EF 55-60%, mild conc LVH, no rwma, Gr 1 DD, mild-mod AI, mild MR.  . Morbid obesity ([place])   . Obesity   . Peptic ulcer disease     Past Surgical History:  Procedure Laterality Date  . ABDOMINAL HYSTERECTOMY    . TOTAL KNEE ARTHROPLASTY Left 01/05/2016   Procedure: TOTAL KNEE ARTHROPLASTY;  Surgeon: Frederik Pear, MD;  Location: [facility];  Service: Orthopedics;  Laterality: Left;  . TUBAL LIGATION      Vitals:   10/06/20 1411  BP: (!) 147/81  Pulse: 64     Subjective Assessment - 10/06/20 1406    Subjective No falls. Exercises are going well. Ambulated in with small base quad cane - was unable to fit her rollator in her ex husband's car who drove her to therapy.    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 No limitation    How long can you stand comfortably? No limitation    How long can you walk comfortably? No limitation    Patient Stated Goals wants to get strength back in arms and legs.    Currently in Pain? No/denies                             [facility] Adult PT Treatment/Exercise - 10/06/20 1437      Transfers   Transfers Sit to Stand;Stand to Sit    Comments x8 reps staggered stance sit <> stands, verbal and demo cues for LLE staggered posteriorly, once in standing cues for pt to shift hips to L before lowering with min guard/min A at times for balance      Neuro Re-ed    Neuro Re-ed Details  with 2" block under RLE for incr weight shift to LLE, mini squats 2 x 10 reps with single UE support, therapist assisting to prevent L genu recurvatum      Exercises   Exercises Other Exercises  Knee/Hip Exercises: Supine   Bridges Strengthening;Both;1 set;10 reps;Limitations    Bridges Limitations with red t band around distal thighs - cues to perform first with hip ABD activation, needing additional manual cues for full ROM    Straight Leg Raises Strengthening;AROM;Left;2 sets;5 reps  Verbal/tactile cues for proper technique - through minimal range     Other Supine Knee/Hip Exercises hooklying bent knee fall outs with red band - x10 reps LLE with cues to keep RLE still and controlled bringing L leg out to touch therapist's hands and then back in, clamshells 2 x 10 reps on LLE - cues at times to keep hips rolled forward      Knee/Hip Exercises: Sidelying   Hip ABduction Strengthening;AROM;Left;2 sets;5 reps    Hip ABduction Limitations straight leg hip ABD - lifting L leg off of bolster, needing assist from therapist for proper positioning                  PT Education - 10/06/20 1647    Education Details  discussed pt will still need to schedule a face to face visit for L AFO    Person(s) Educated Patient    Methods Explanation    Comprehension Verbalized understanding            PT Short Term Goals - 10/06/20 1651      PT SHORT TERM GOAL #1   Title Pt will undergo further assessment of BERG -LTG written as appropriate. ALL STGS DUE 10/09/20    Baseline 29/56    Time 4    Period Weeks    Status Achieved    Target Date 10/09/20      PT SHORT TERM GOAL #2   Title Pt will undergo further assessment of AFO for improved gait mechanics.    Baseline visit with orthotist on 10/03/20    Time 4    Period Weeks    Status Achieved      PT SHORT TERM GOAL #3   Title Pt will improve gait sped to at least 1.8 ft/sec with small base quad in order to decr fall risk.    Baseline 1.58 ft/sec    Time 4    Period Weeks    Status New      PT SHORT TERM GOAL #4   Title Pt will decr TUG to 17 seconds or less with small based quad cane in order to demo decr fall risk.    Baseline 19.09 seconds    Time 4    Period Weeks    Status New      PT SHORT TERM GOAL #5   Title Pt will improve 5x sit <> stand time with no UE support in 17 seconds or less in order to demo improved BLE strength.    Baseline 20.22 seconds with no UE support    Time 4    Period Weeks    Status New             PT Long Term Goals - 10/06/20 1652      PT LONG TERM GOAL #1   Title Pt will be independent with final HEP in order to build upon functional gains made in therapy. ALL LTGS DUE 11/06/20    Time 8    Period Weeks    Status New      PT LONG TERM GOAL #2   Title Pt will improve BERG to at least a 36/56 in order to demo decr fall risk.  Baseline 29/56    Time 8    Period Weeks    Status Revised      PT LONG TERM GOAL #3   Title Pt will improve gait sped to at least 2.2 ft/sec with small base quad in order to improve gait efficiency.    Baseline 1.58 ft/sec    Time 8    Period Weeks    Status New       PT LONG TERM GOAL #4   Title Pt will decr TUG to 15 seconds or less with small based quad cane in order to demo decr fall risk.    Baseline 19.09 seconds    Time 8    Period Weeks    Status New      PT LONG TERM GOAL #5   Title Pt will ambulate at least 300' over indoor and outdoor unlevel surfaces with supervision with small base quad cane in order to improve functional mobility.    Time 8    Period Weeks    Status New                 Plan - 10/06/20 1649    Clinical Impression Statement Focus of today's skilled session was LLE>RLE strengthening and NMR for weight shifting towards LLE. Pt fatigued easily after staggered stance sit <> stands with LLE, needing frequent cueing of proper foot placement. Pt needing assist to decr genu recurvatum on LLE during mini squats due to pt having no sensation/decr proprioception in that leg. Will continue to progress towards LTGs.    Personal Factors and Comorbidities Time since onset of injury/illness/exacerbation;Past/Current Experience;Fitness    Comorbidities h/o Rt CVA in 2007; anxiety & depression, HTN, mild to moderate aortic insufficiency    Examination-Activity Limitations Squat;Transfers;Stand;Locomotion Level    Examination-Participation Restrictions Estate agent;Shop    Stability/Clinical Decision Making Stable/Uncomplicated    Rehab Potential Good    PT Frequency 2x / week    PT Duration 8 weeks    PT Treatment/Interventions ADLs/Self Care Home Management;Therapeutic exercise;Therapeutic activities;Patient/family education;Passive range of motion;DME Instruction;Gait training;Functional mobility training;Stair training;Balance training;Neuromuscular re-education;Orthotic Fit/Training;Vestibular;Energy conservation    PT Next Visit Plan check STGs.  standing balance with UE support (add to HEP as needed), continue to work on LE Radiation protection practitioner and Agree with Plan of Care Patient            Patient will benefit from skilled therapeutic intervention in order to improve the following deficits and impairments:  Decreased strength, Decreased range of motion, Decreased activity tolerance, Abnormal gait, Decreased balance, Decreased coordination, Difficulty walking, Dizziness, Impaired sensation, Impaired tone  Visit Diagnosis: Muscle weakness (generalized)  Unsteadiness on feet  Other abnormalities of gait and mobility     Problem List Patient Active Problem List   Diagnosis Date Noted  . 54 04/03/2020  . Nutritional counseling 01/19/2018  . Primary osteoarthritis of left knee 01/03/2016  . Mild to Moderate Aortic Insufficiency   . Hypertension   . Hyperlipidemia   . Morbid obesity ([place])   . Pulmonary hypertension ([place]) 01/30/2013  . Orthostatic hypotension 12/18/2012  . Aortic valve disorder 07/29/2010  . CHEST PAIN-PRECORDIAL 07/29/2010  . CYST OF THYROID 01/06/2010  . HYPOKALEMIA 01/06/2010  . HYPERLIPIDEMIA 12/24/2009  . OBESITY 12/24/2009  . HYPERTENSION, UNSPECIFIED 12/24/2009  . Personal history of unspecified circulatory disease 12/24/2009  . PEPTIC ULCER DISEASE, HX OF 12/24/2009    Arliss Journey, PT, DPT  10/06/2020, 4:52 PM  [place]  [place] [address] [place], [place], [address] Phone: [phone]   Fax:  [phone]  Name: Chelsea [name] MRN: [id number] Date of Birth: [birth year]

## 2020-10-06 NOTE — Therapy (Signed)
[facility] [address] [place], [place], [address] Phone: [phone]   Fax:  [phone]  Occupational Therapy Evaluation  Patient Details  Name: Chelsea [name] MRN: [id number] Date of Birth: 1952/01/68 Referring Provider (OT): Star Age, MD   Encounter Date: 10/06/2020   OT End of Session - 10/06/20 1520    Visit Number 1    Number of Visits 17    Date for OT Re-Evaluation 12/01/20    Authorization Type UHC Medicare/Medicaid    Authorization Time Period No Auth Req covered 100%    Authorization - Visit Number 1    Authorization - Number of Visits 10    Progress Note Due on Visit 10    OT Start Time 9798    OT Stop Time 1530    OT Time Calculation (min) 45 min    Activity Tolerance Patient tolerated treatment well    Behavior During Therapy [facility] for tasks assessed/performed           Past Medical History:  Diagnosis Date  . Anxiety   . Cerebrovascular accident [facility])    a. 2007-->residual left sided weakness  . Chest pain   . Depression   . GERD (gastroesophageal reflux disease)   . Headache   . Hx of hysterectomy   . Hyperlipidemia   . Hypertension   . Mild to Moderate Aortic Insufficiency    a. 08/2014 Echo: EF 55-60%, mild conc LVH, no rwma, Gr 1 DD, mild-mod AI, mild MR.  . Morbid obesity ([place])   . Obesity   . Peptic ulcer disease     Past Surgical History:  Procedure Laterality Date  . ABDOMINAL HYSTERECTOMY    . 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 TUBAL LIGATION      There were no vitals filed for this visit.   Subjective Assessment - 10/06/20 1447    Subjective  Pt denies any pain currently but reports 8/10 pain in her back after standing for prolonged (greater than 5-10 minutes) of standing. Pt presents to OPOT today with LUE weakness and poor  coordination impeding ADL and IADL ability and independence. Pt    Patient is accompanied by: --   none   Pertinent History PMH is significant for: h/o Rt CVA in 2007; anxiety & depression, HTN, mild to moderate aortic insufficiency.    Limitations Fall Risk. High BP controlled with meds.    Patient Stated Goals play my games, want some feeling in my hand (LUE),    Currently in Pain? No/denies   denies at eval but reports 8/10 pain after standing 5-10 minutes in back   Pain Onset --             [facility] OT Assessment - 10/06/20 1449      Assessment   Medical Diagnosis CVA (2007)    Referring Provider (OT) Star Age, MD    Onset Date/Surgical Date 09/18/20   date of referral   Hand Dominance Right      Precautions   Precautions Fall      Balance Screen   Has the patient fallen in the past 6 months Yes   seeing PT   How many times? 1      Home  Environment   Family/patient expects to be discharged to: Private residence    [place]   currently  grandson is living there   Type of [place] One level    Bathroom Shower/Tub Tub/Shower unit    [employer] - single point;Tub bench;Grab bars - tub/shower      Prior Function   Level of Independence Independent    Vocation On disability    Leisure talk to my brother on the phone      ADL   Eating/Feeding Modified independent    Grooming Modified independent    Upper Body Bathing Modified independent    Lower Body Bathing Modified independent    Upper Body Dressing Increased time    Lower Body Dressing Increased time    Toilet Transfer Modified independent    Toileting - Clothing Manipulation Modified independent    Toileting -  Hygiene Modified Independent    Tub/Shower Transfer Modified independent    Journalist, newspaper Used Increased time    ADL comments pt reports she does everything independent. some difificulty reported with buttons  and fasteners secondary ot limited control and coordination of LUE      IADL   Shopping Needs to be accompanied on any shopping trip    Light Housekeeping Does personal laundry completely;Maintains house alone or with occasional assistance   standing too long hurts, reports difficulty sweeping unilate   Meal Prep Plans, prepares and serves adequate meals independently   sits on rollator most of the time while [place] independently on public transportation;Relies on family or friends for transportation    Medication Management Is responsible for taking medication in correct dosages at correct time    Physiological scientist financial matters independently (budgets, writes checks, pays rent, bills goes to bank), collects and keeps track of income      Written Expression   Dominant Hand Right      Vision - History   Baseline Vision Wears glasses all the time    Additional Comments no reports of changes to vision or difficulty with vision      Activity Tolerance   Activity Tolerance Tolerates 10-20 min activity with multiple rests    Activity Tolerance Comments 8/10 back pain with standing      Cognition   Overall Cognitive Status No family/caregiver present to determine baseline cognitive functioning    Memory Appears intact    Cognition Comments will continue to assess in a functional setting      Observation/Other Assessments   Focus on Therapeutic Outcomes (FOTO)  N/A      Sensation   Light Touch Impaired Detail    Light Touch Impaired Details Absent LUE    Proprioception Impaired Detail    Proprioception Impaired Details Impaired LUE    Additional Comments reports feeling absent in LUE      Coordination   Gross Motor Movements are Fluid and Coordinated No   LUE   Fine Motor Movements are Fluid and Coordinated No   LUE   9 Hole Peg Test Right;Left    Right 9 Hole Peg Test 23.93    Left 9 Hole Peg Test 0 pegs    Box and Blocks RUE 52 LUE 11     Coordination impaired in LUE      Perception   Perception Not tested      Tone   Assessment Location Left Upper Extremity      ROM / Strength   AROM / PROM / Strength AROM;Strength  AROM   Overall AROM  Within functional limits for tasks performed      Strength   Overall Strength Deficits    Overall Strength Comments RUE WFL    Strength Assessment Site Shoulder;Elbow    Right Shoulder Flexion 5/5    Right Shoulder ABduction 5/5    Right Shoulder Internal Rotation 5/5    Right Shoulder External Rotation 5/5    Right Shoulder Horizontal ABduction 5/5    Left Shoulder Flexion 3+/5    Left Shoulder ABduction 3+/5    Right/Left Elbow Right;Left    Right Elbow Flexion 5/5    Right Elbow Extension 5/5    Left Elbow Flexion 4-/5    Left Elbow Extension 4-/5      Hand Function   Right Hand Grip (lbs) 38.1    Left Hand Grip (lbs) 23.1    Comment gross composite fist and extension 100%      LUE Tone   LUE Tone Moderate;Hypertonic;Other (Comment)      LUE Tone   Hypertonic Details continue to assess.                              OT Short Term Goals - 10/06/20 1817      OT SHORT TERM GOAL #1   Title Pt will be independent with HEP 11/03/20    Baseline none issued yet    Time 4    Period Weeks    Status New    Target Date 11/03/20      OT SHORT TERM GOAL #2   Title Pt will verbalize understanding of sensory strategies to increase safety for cooking and other ADLs and IADLs.    Baseline have not reviewed yet    Time 4    Period Weeks    Status New      OT SHORT TERM GOAL #3   Title Pt will demontrate improved standing tolerance by completing activity in standing for 10 minutes or greater with pain less than or equal to 6/10    Baseline back pain 8/10 standing 5-10 minutes    Time 4    Period Weeks    Status New      OT SHORT TERM GOAL #4   Title Pt will report increased ease with manipulating fasteners and tying shoes with A/E prn.     Baseline increased time and difficulty    Time 4    Period Weeks    Status New      OT SHORT TERM GOAL #5   Title Pt will demonstrate improved functional use of LUE evidenced by Box and Blocks score of 16 or more.    Baseline 11    Time 4    Period Weeks    Status New             OT Long Term Goals - 10/06/20 1823      OT LONG TERM GOAL #1   Title Pt will be independent with updated HEP 12/01/2020    Baseline none    Time 8    Period Weeks    Status New    Target Date 12/01/20      OT LONG TERM GOAL #2   Title Pt will report functional use of LUE during basic ADLs and IADLs at least 50% of tasks requiring non dominant side.    Baseline 10-15%    Time 8    Period Weeks  Status New      OT LONG TERM GOAL #3   Title Pt will improve functional use of LUE as evidenced by performing 20 blocks on Box & Blocks test    Baseline 11    Time 8    Period Weeks    Status New      OT LONG TERM GOAL #4   Title Pt will demontrate improved activity tolerance by completing activity in standing for 15 minutes or greater with pain less than or equal to 5/10    Baseline 8/10 with standing 5-10 minutes    Time 8    Period Weeks    Status New      OT LONG TERM GOAL #5   Title Pt will complete a mod complex cooking task or home management task with mod I and good safety awareness with reports of increased ease and use of adapted strategies and/or equipment PRN    Baseline diff with sweeping and standing for cooking etc.    Time 8    Period Weeks    Status New                 Plan - 10/06/20 1522    Clinical Impression Statement Patient is a 69 year old female referred to Neuro OPOT for LUE weakness and poor coordination, sensory deficits and imbalance from CVA (from 2007). Pt's PMH is significant for: h/o Rt CVA in 2007; anxiety & depression, HTN, mild to moderate aortic insufficiency. Pt presents with listed deficits above impeding ability to complete ADLs and IADLS with  maximal independence. Skilled OT is recommended to target listed areas in order to increase independence and provide adaptive strategies and education for deficits.    OT Occupational Profile and History Problem Focused Assessment - Including review of records relating to presenting problem    Occupational performance deficits (Please refer to evaluation for details): ADL's;IADL's;Leisure    Body Structure / Function / Physical Skills ADL;Decreased knowledge of use of DME;Strength;Tone;Pain;GMC;Dexterity;Balance;Proprioception;UE functional use;ROM;IADL;Sensation;Flexibility;Coordination;FMC    Cognitive Skills Problem Solve;Perception    Rehab Potential Fair    Clinical Decision Making Limited treatment options, no task modification necessary    Comorbidities Affecting Occupational Performance: May have comorbidities impacting occupational performance    Modification or Assistance to Complete Evaluation  No modification of tasks or assist necessary to complete eval    OT Frequency 2x / week    OT Duration 8 weeks    OT Treatment/Interventions Self-care/ADL training;DME and/or AE instruction;Balance training;Therapeutic Air traffic controller;Therapeutic exercise;Cognitive remediation/compensation;Passive range of motion;Functional Mobility Training;Neuromuscular education;Manual Therapy;Patient/family education;Energy conservation    Plan sensory strategies, home management tasks    Consulted and Agree with Plan of Care Patient           Patient will benefit from skilled therapeutic intervention in order to improve the following deficits and impairments:   Body Structure / Function / Physical Skills: ADL, Decreased knowledge of use of DME, Strength, Tone, Pain, GMC, Dexterity, Balance, Proprioception, UE functional use, ROM, IADL, Sensation, Flexibility, Coordination, [facility] Cognitive Skills: Problem Solve, Perception     Visit Diagnosis: Unsteadiness on feet - Plan: Ot plan of care  cert/re-cert  Muscle weakness (generalized) - Plan: Ot plan of care cert/re-cert  Other abnormalities of gait and mobility - Plan: Ot plan of care cert/re-cert  Other lack of coordination - Plan: Ot plan of care cert/re-cert  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 54 04/03/2020  . Nutritional counseling 01/19/2018  . Primary osteoarthritis of left knee 01/03/2016  . Mild to Moderate Aortic Insufficiency   . Hypertension   . Hyperlipidemia   . Morbid obesity ([place])   . Pulmonary hypertension ([place]) 01/30/2013  . Orthostatic hypotension 12/18/2012  . Aortic valve disorder 07/29/2010  . CHEST PAIN-PRECORDIAL 07/29/2010  . CYST OF THYROID 01/06/2010  . HYPOKALEMIA 01/06/2010  . HYPERLIPIDEMIA 12/24/2009  . OBESITY 12/24/2009  . HYPERTENSION, UNSPECIFIED 12/24/2009  . Personal history of unspecified circulatory disease 12/24/2009  . PEPTIC ULCER DISEASE, HX OF 12/24/2009    Zachery Conch MOT, OTR/L  10/06/2020, 6:31 PM  [facility] [address] [place], [place], [address] Phone: [phone]   Fax:  [phone]  Name: Eduardo Wurth MRN: [id number] Date of Birth: 1951-09-10

## 2020-10-07 ENCOUNTER — Ambulatory Visit (INDEPENDENT_AMBULATORY_CARE_PROVIDER_SITE_OTHER): Payer: Medicare Other | Admitting: Pharmacist

## 2020-10-07 VITALS — BP 140/82 | HR 62

## 2020-10-07 DIAGNOSIS — I10 Essential (primary) hypertension: Secondary | ICD-10-CM

## 2020-10-07 DIAGNOSIS — E7849 Other hyperlipidemia: Secondary | ICD-10-CM

## 2020-10-07 MED ORDER — ATORVASTATIN CALCIUM 10 MG PO TABS: 10.0000 mg | ORAL_TABLET | Freq: Every day | ORAL | 3 refills | Status: DC

## 2020-10-07 MED ORDER — INDAPAMIDE 2.5 MG PO TABS: 2.5000 mg | ORAL_TABLET | Freq: Every day | ORAL | 11 refills | Status: DC

## 2020-10-07 NOTE — Patient Instructions (Signed)
It was nice to see you today!  Your goal blood pressure is 130/80 mmHg. In clinic, your blood pressure was 140/82 mmHg.  Medication Changes: I will give you a call tomorrow and if your labs are normal, you will start taking Spironolactone 50 mg daily  Continue   Amlodipine/valsartan ([place]) 10/320 mg daily  Metoprolol succinate 100 mg daily   Indapamide 2.5 mg daily   STOP doxazosin and clonidine   Please bring all medications to next   Monitor blood pressure at home daily and keep a log (on your phone or piece of paper) to bring with you to your next visit. Write down date, time, blood pressure and pulse.  Keep up the good work with diet and exercise. Aim for a diet full of vegetables, fruit and lean meats (chicken, [place], fish). Try to limit salt intake by eating fresh or frozen vegetables (instead of canned), rinse canned vegetables prior to cooking and do not add any additional salt to meals.   Please give [place] a call at [phone] with any questions or concerns.

## 2020-10-08 ENCOUNTER — Ambulatory Visit: Payer: Medicare Other | Admitting: Physical Therapy

## 2020-10-08 ENCOUNTER — Ambulatory Visit: Payer: Medicare Other | Admitting: Occupational Therapy

## 2020-10-08 ENCOUNTER — Telehealth: Payer: Self-pay | Admitting: Pharmacist

## 2020-10-08 ENCOUNTER — Encounter: Payer: Self-pay | Admitting: Occupational Therapy

## 2020-10-08 ENCOUNTER — Other Ambulatory Visit: Payer: Self-pay

## 2020-10-08 ENCOUNTER — Encounter: Payer: Self-pay | Admitting: Physical Therapy

## 2020-10-08 VITALS — BP 127/88

## 2020-10-08 DIAGNOSIS — I10 Essential (primary) hypertension: Secondary | ICD-10-CM

## 2020-10-08 DIAGNOSIS — I69354 Hemiplegia and hemiparesis following cerebral infarction affecting left non-dominant side: Secondary | ICD-10-CM

## 2020-10-08 DIAGNOSIS — M6281 Muscle weakness (generalized): Secondary | ICD-10-CM | POA: Diagnosis not present

## 2020-10-08 DIAGNOSIS — R278 Other lack of coordination: Secondary | ICD-10-CM

## 2020-10-08 DIAGNOSIS — R41840 Attention and concentration deficit: Secondary | ICD-10-CM

## 2020-10-08 DIAGNOSIS — R2689 Other abnormalities of gait and mobility: Secondary | ICD-10-CM

## 2020-10-08 DIAGNOSIS — R2681 Unsteadiness on feet: Secondary | ICD-10-CM | POA: Diagnosis not present

## 2020-10-08 DIAGNOSIS — I69854 Hemiplegia and hemiparesis following other cerebrovascular disease affecting left non-dominant side: Secondary | ICD-10-CM | POA: Diagnosis not present

## 2020-10-08 LAB — BASIC METABOLIC PANEL
BUN/Creatinine Ratio: 14 (ref 12–28)
BUN: 19 mg/dL (ref 8–27)
CO2: 28 mmol/L (ref 20–29)
Calcium: 9.7 mg/dL (ref 8.7–10.3)
Chloride: 98 mmol/L (ref 96–106)
Creatinine, Ser: 1.35 mg/dL — ABNORMAL HIGH (ref 0.57–1.00)
GFR calc Af Amer: 47 mL/min/{1.73_m2} — ABNORMAL LOW (ref >59)
GFR calc non Af Amer: 40 mL/min/{1.73_m2} — ABNORMAL LOW (ref >59)
Glucose: 92 mg/dL (ref 65–99)
Potassium: 3.8 mmol/L (ref 3.5–5.2)
Sodium: 142 mmol/L (ref 134–144)

## 2020-10-08 MED ORDER — DOXAZOSIN MESYLATE 1 MG PO TABS: 1.0000 mg | ORAL_TABLET | Freq: Every day | ORAL | 11 refills | Status: DC

## 2020-10-08 NOTE — Therapy (Signed)
[facility] [address] [place], [place], [address] Phone: [phone]   Fax:  [phone]  Occupational Therapy Treatment  Patient Details  Name: Chelsea [name] MRN: [id number] Date of Birth: 05-14-1951 Referring Provider (OT): Star Age, MD   Encounter Date: 10/08/2020   OT End of Session - 10/08/20 1320    Visit Number 2    Number of Visits 17    Date for OT Re-Evaluation 12/01/20    Authorization Type UHC Medicare/Medicaid    Authorization Time Period No Auth Req covered 100%    Authorization - Visit Number 2    Authorization - Number of Visits 10    Progress Note Due on Visit 10    OT Start Time 1320    OT Stop Time 1400    OT Time Calculation (min) 40 min    Activity Tolerance Patient tolerated treatment well    Behavior During Therapy [facility] for tasks assessed/performed           Past Medical History:  Diagnosis Date  . Anxiety   . Cerebrovascular accident [facility])    a. 2007-->residual left sided weakness  . Chest pain   . Depression   . GERD (gastroesophageal reflux disease)   . Headache   . Hx of hysterectomy   . Hyperlipidemia   . Hypertension   . Mild to Moderate Aortic Insufficiency    a. 08/2014 Echo: EF 55-60%, mild conc LVH, no rwma, Gr 1 DD, mild-mod AI, mild MR.  . Morbid obesity ([place])   . Obesity   . Peptic ulcer disease     Past Surgical History:  Procedure Laterality Date  . ABDOMINAL HYSTERECTOMY    . 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 TUBAL LIGATION      There were no vitals filed for this visit.   Subjective Assessment - 10/08/20 1321    Subjective  Pt denies any changes or pain today.    Patient is accompanied by: --   none   Pertinent History PMH is significant for: h/o Rt CVA in 2007; anxiety & depression, HTN, mild to moderate aortic  insufficiency.    Limitations Fall Risk. High BP controlled with meds.    Patient Stated Goals play my games, want some feeling in my hand (LUE),    Currently in Pain? No/denies            Treatment: Semi circle pegs with white pegs with 2 lb wrist weight for coordination and maintaining more control and decreasing tremor in LUE.   Stacking cones bimanually. Pt holding cone with RUE and stacking cone with LUE. Cueing provided for deep breaths and relaxing for decreasing intentional tremors. Pt with more success after breathing and developing more control.  Stringing large beads with elbow on table and utilizing cues for deep breathing and relaxation with a moment before proceeding. Tremors decreased with taking time and breathing and pausing between movements.   Pt trialed weighted utensils and education provided on purchasing equipment                         OT Short Term Goals - 10/06/20 1817      OT SHORT TERM GOAL #1   Title Pt will be independent with HEP 11/03/20    Baseline none issued yet    Time  4    Period Weeks    Status New    Target Date 11/03/20      OT SHORT TERM GOAL #2   Title Pt will verbalize understanding of sensory strategies to increase safety for cooking and other ADLs and IADLs.    Baseline have not reviewed yet    Time 4    Period Weeks    Status New      OT SHORT TERM GOAL #3   Title Pt will demontrate improved standing tolerance by completing activity in standing for 10 minutes or greater with pain less than or equal to 6/10    Baseline back pain 8/10 standing 5-10 minutes    Time 4    Period Weeks    Status New      OT SHORT TERM GOAL #4   Title Pt will report increased ease with manipulating fasteners and tying shoes with A/E prn.    Baseline increased time and difficulty    Time 4    Period Weeks    Status New      OT SHORT TERM GOAL #5   Title Pt will demonstrate improved functional use of LUE evidenced by Box and  Blocks score of 16 or more.    Baseline 11    Time 4    Period Weeks    Status New             OT Long Term Goals - 10/06/20 1823      OT LONG TERM GOAL #1   Title Pt will be independent with updated HEP 12/01/2020    Baseline none    Time 8    Period Weeks    Status New    Target Date 12/01/20      OT LONG TERM GOAL #2   Title Pt will report functional use of LUE during basic ADLs and IADLs at least 50% of tasks requiring non dominant side.    Baseline 10-15%    Time 8    Period Weeks    Status New      OT LONG TERM GOAL #3   Title Pt will improve functional use of LUE as evidenced by performing 20 blocks on Box & Blocks test    Baseline 11    Time 8    Period Weeks    Status New      OT LONG TERM GOAL #4   Title Pt will demontrate improved activity tolerance by completing activity in standing for 15 minutes or greater with pain less than or equal to 5/10    Baseline 8/10 with standing 5-10 minutes    Time 8    Period Weeks    Status New      OT LONG TERM GOAL #5   Title Pt will complete a mod complex cooking task or home management task with mod I and good safety awareness with reports of increased ease and use of adapted strategies and/or equipment PRN    Baseline diff with sweeping and standing for cooking etc.    Time 8    Period Weeks    Status New                 Plan - 10/08/20 1404    Clinical Impression Statement Pt is making progress towards goals. Pt demonstrating improved coordination with LUE with cueing.    OT Occupational Profile and History Problem Focused Assessment - Including review of records relating to presenting problem  Occupational performance deficits (Please refer to evaluation for details): ADL's;IADL's;Leisure    Body Structure / Function / Physical Skills ADL;Decreased knowledge of use of DME;Strength;Tone;Pain;GMC;Dexterity;Balance;Proprioception;UE functional use;ROM;IADL;Sensation;Flexibility;Coordination;FMC     Cognitive Skills Problem Solve;Perception    Rehab Potential Fair    Clinical Decision Making Limited treatment options, no task modification necessary    Comorbidities Affecting Occupational Performance: May have comorbidities impacting occupational performance    Modification or Assistance to Complete Evaluation  No modification of tasks or assist necessary to complete eval    OT Frequency 2x / week    OT Duration 8 weeks    OT Treatment/Interventions Self-care/ADL training;DME and/or AE instruction;Balance training;Therapeutic Air traffic controller;Therapeutic exercise;Cognitive remediation/compensation;Passive range of motion;Functional Mobility Training;Neuromuscular education;Manual Therapy;Patient/family education;Energy conservation    Plan sensory strategies, home management tasks    Consulted and Agree with Plan of Care Patient           Patient will benefit from skilled therapeutic intervention in order to improve the following deficits and impairments:   Body Structure / Function / Physical Skills: ADL, Decreased knowledge of use of DME, Strength, Tone, Pain, GMC, Dexterity, Balance, Proprioception, UE functional use, ROM, IADL, Sensation, Flexibility, Coordination, FMC Cognitive Skills: Problem Solve, Perception     Visit Diagnosis: Other abnormalities of gait and mobility  Attention and concentration deficit  Muscle weakness (generalized)  Other lack of coordination    Problem List Patient Active Problem List   Diagnosis Date Noted  . 54 04/03/2020  . Nutritional counseling 01/19/2018  . Primary osteoarthritis of left knee 01/03/2016  . Mild to Moderate Aortic Insufficiency   . Hypertension   . Hyperlipidemia   . Morbid obesity ([place])   . Pulmonary hypertension ([place]) 01/30/2013  . Orthostatic hypotension 12/18/2012  . Aortic valve disorder 07/29/2010  . CHEST PAIN-PRECORDIAL 07/29/2010  . CYST OF THYROID 01/06/2010  . HYPOKALEMIA 01/06/2010  .  HYPERLIPIDEMIA 12/24/2009  . OBESITY 12/24/2009  . HYPERTENSION, UNSPECIFIED 12/24/2009  . Personal history of unspecified circulatory disease 12/24/2009  . PEPTIC ULCER DISEASE, HX OF 12/24/2009    Zachery Conch MOT, OTR/L  10/08/2020, 5:16 PM  [facility] [address] [place], [place], [address] Phone: [phone]   Fax:  [phone]  Name: Chelsea [name] MRN: [id number] Date of Birth: December 20, 1950

## 2020-10-08 NOTE — Telephone Encounter (Signed)
Contacted patient regarding hypertension management. Given recent bump in Sc[place], we will not increase spironolactone to 50 mg daily. Instructed patient to continue current spironolactone 25 mg daily and will restart doxazosin at a lower dose of 1 mg daily at bedtime. Patient verbalized understanding. Prescription sent to preferred pharmacy. Reminded patient to bring all medications to next follow-up visit in 2 weeks.

## 2020-10-08 NOTE — Patient Instructions (Signed)
  Search for these items for utensils for hand tremors on [employer] or online:   Built up [employer]

## 2020-10-09 NOTE — Progress Notes (Signed)
Patient ID: Chelsea [name]                 DOB: 12-25-1950                      MRN: [id number]     HPI: Chelsea [name] is a 69 y.o. female referred by Dr. Corey Skains HTN clinic. PMH is significant for mild aortic regurgitation, non-cardiac chest pain, hx stroke and residual left-sided weakness, HLD, HTN, and moderate obesity.    Patient last seen in pharmacy clinic 10/07/20. She reported medication adherence with all HTN and HF medications except for doxasozin because she ran out 1 month ago. Also reported completing clonidine taper. At that visit, spironolactone was to be increased to 50 mg daily. However, given bump in Scr, patient was instructed to continue spironolactone 25 mg daily and restart doxazosin 1 mg at bedtime.   Today she presents in good spirits. She reported tolerating re-initiation of doxazosin well. Reported home BP of 128/88 this morning. Reported smoking 1 cigarette since starting nicotine patches because she gets frustrated with her grandson. She stated talking with her brother and then playing her favorite game helps alleviate her stress.   Current HTN meds: amlodipine/valsartan (Exforge) 10/320 mg daily (AM), metoprolol succinate 100 mg daily (PM), doxazosin 1 mgQHS, indapamide 2.5 mg daily (AM), Spironolactone 25mg  daily (AM)   Previously tried:lisinopril 10 mg daily (low BP), bumetanide 1 mg daily BP goal:<130/80 mmHg   Family History:Cancer in her brother; Cirrhosis in her father; Drug abuse in her brother; Heart attack in an other family member; Hypertension in her mother; Peripheral vascular disease in her mother.  Social History:current smoker (has not had a cigarette in 3 days)  - Currently smoking 1 pack/day (most 3 packs/day) - Provided Rx for nicotine 21 mg patches on 09/23/20  - Nicotine patches, smoked one time since starting patches, smokes when frustrated but talks to brother   Diet:Does not add salt to food and uses Mrs DASH. Drinks juice,  decaf coffeeoccasionally, trying to decrease soda intake. 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  Past Medical History:  Diagnosis Date  . Anxiety   . Cerebrovascular accident [facility])    a. 2007-->residual left sided weakness  . Chest pain   . Depression   . GERD (gastroesophageal reflux disease)   . Headache   . Hx of hysterectomy   . Hyperlipidemia   . Hypertension   . Mild to Moderate Aortic Insufficiency    a. 08/2014 Echo: EF 55-60%, mild conc LVH, no rwma, Gr 1 DD, mild-mod AI, mild MR.  Marland Kitchen  Morbid obesity ([place])   . Obesity   . Peptic ulcer disease     Current Outpatient Medications on File Prior to Visit  Medication Sig Dispense Refill  . amLODipine-valsartan (EXFORGE) 10-320 MG tablet Take 1 tablet by mouth daily.    Marland Kitchen aspirin EC 81 MG tablet Take 1 tablet (81 mg total) by mouth daily.    Marland Kitchen atorvastatin (LIPITOR) 10 MG tablet Take 1 tablet (10 mg total) by mouth daily. 90 tablet 3  . chlorhexidine (PERIDEX) 0.12 % solution 15 mLs 2 (two) times daily.    . citalopram (CELEXA) 10 MG tablet Take 20 mg by mouth daily.     . cyanocobalamin 100 MCG tablet Take 100 mcg by mouth daily.     .  cyclobenzaprine (FLEXERIL) 10 MG tablet Take 10 mg by mouth 3 (three) times daily as needed for muscle spasms.    Marland Kitchen doxazosin (CARDURA) 1 MG tablet Take 1 tablet (1 mg total) by mouth daily. 30 tablet 11  . ezetimibe (ZETIA) 10 MG tablet Take 10 mg by mouth daily.    . furosemide (LASIX) 20 MG tablet Take 20 mg by mouth as needed. (Patient not taking: Reported on 09/23/2020)    . gabapentin (NEURONTIN) 800 MG tablet Take 1 tablet (800 mg total) by mouth 2 (two) times daily. 180 tablet 3  . indapamide (LOZOL) 2.5 MG tablet Take 1 tablet (2.5 mg total) by mouth daily. 30 tablet 11  . meclizine (ANTIVERT) 25 MG tablet Take 25 mg by mouth 3 (three) times daily as needed.    . methocarbamol (ROBAXIN) 500 MG tablet Take 500-1,000 mg by mouth 3 (three) times daily as needed.    . metoprolol succinate (TOPROL-XL) 100 MG 24 hr tablet Take 1 tablet (100 mg total) by mouth daily. Take with or immediately following a meal. 30 tablet 11  . mirabegron ER (MYRBETRIQ) 25 MG TB24 tablet Take 25 mg by mouth daily.    . Multiple Vitamin (MULTIVITAMIN) tablet Take 1 tablet by mouth daily.      . nicotine (NICODERM CQ - DOSED IN MG/24 HOURS) 21 mg/24hr patch Place 1 patch (21 mg total) onto the skin daily. 28 patch 2  . nitroGLYCERIN (NITROSTAT) 0.4 MG SL tablet Place 1 tablet (0.4 mg total) under the tongue every 5 (five) minutes as needed for chest pain. 25 tablet 3  . pantoprazole (PROTONIX) 40 MG tablet Take 40 mg by mouth 2 (two) times daily.     Marland Kitchen spironolactone (ALDACTONE) 25 MG tablet Take 1 tablet (25 mg total) by mouth daily. 30 tablet 11  . tiZANidine (ZANAFLEX) 4 MG tablet Take 8 mg by mouth 3 (three) times daily as needed. (Patient not taking: Reported on 09/23/2020)    . traMADol (ULTRAM) 50 MG tablet Take 50 mg by mouth every 8 (eight) hours as needed.     No current facility-administered medications on file prior to visit.    Allergies  Allergen Reactions  . Other Swelling    Hair dye, caused eye  swelling, multiple times experienced this reaction  . Hydrocodone-Acetaminophen Nausea Only  . Oxycodone Nausea And Vomiting     Assessment/Plan:  1. Hypertension - Patient presents with BP slightly above goal of <130/80. Medication adherence appears optimal. Considering BP at or near goal in recent measurements and doxazosin was recently re-initiated, will make no changes today. Discussed lifestyle modifications that can help to lower blood pressure including making her own sandwich meat instead of buying deli meat, cooking  in olive oil rather than butter or vegetable shortening, and continuing smoking cessation efforts.    Patient will be seen by pharmacy clinic in two weeks.    Benna Dunks  PharmD Candidate, Class of 2022  Karren Cobble, PharmD, BCACP, [name] [address] [place], [place] [address] Phone: [phone]; Fax: [phone] 10/10/2020 4:09 PM

## 2020-10-09 NOTE — Therapy (Signed)
[facility] [address] [place], [place], [address] Phone: [phone]   Fax:  [phone]  Physical Therapy Treatment  Patient Details  Name: Chelsea [name] MRN: [id number] Date of Birth: 12/10/1950 Referring Provider (PT): Star Age, MD   Encounter Date: 10/08/2020   PT End of Session - 10/08/20 1405    Visit Number 7    Number of Visits 17    Date for PT Re-Evaluation 12/10/20   written for 60 day POC   Authorization Type UHC Medicare & Medicaid    PT Start Time 1402    PT Stop Time 1445    PT Time Calculation (min) 43 min    Equipment Utilized During Treatment Gait belt    Activity Tolerance Patient tolerated treatment well    Behavior During Therapy WFL for tasks assessed/performed           Past Medical History:  Diagnosis Date   Anxiety    Cerebrovascular accident [facility])    a. 2007-->residual left sided weakness   Chest pain    Depression    GERD (gastroesophageal reflux disease)    Headache    Hx of hysterectomy    Hyperlipidemia    Hypertension    Mild to Moderate Aortic Insufficiency    a. 08/2014 Echo: EF 55-60%, mild conc LVH, no rwma, Gr 1 DD, mild-mod AI, mild MR.   Morbid obesity ([place])    Obesity    Peptic ulcer disease     Past Surgical History:  Procedure Laterality Date   ABDOMINAL HYSTERECTOMY     TOTAL KNEE ARTHROPLASTY Left 01/05/2016   Procedure: TOTAL KNEE ARTHROPLASTY;  Surgeon: Frederik Pear, MD;  Location: [facility];  Service: Orthopedics;  Laterality: Left;   TUBAL LIGATION      Vitals:   10/08/20 1405  BP: 127/88     Subjective Assessment - 10/08/20 1405    Subjective No new complaints. No falls or pain to report. States her HEP is going well.    Pertinent History Rt CVA in 2007 with Lt hemiparesis;  anxiety, depression, mild to moderate aortic insufficiency,  arthritis with s/p L TKA in 2/16, thalamic pain syndrome    Limitations Walking;House hold  activities    How long can you sit comfortably? No limitation    How long can you stand comfortably? No limitation    How long can you walk comfortably? No limitation    Patient Stated Goals wants to get strength back in arms and legs.    Pain Score 0-No pain              [facility] PT Assessment - 10/08/20 1413      Standardized Balance Assessment   Standardized Balance Assessment Timed Up and Go Test      Timed Up and Go Test   Normal TUG (seconds) 14.88    TUG Comments with rollator                [facility] Adult PT Treatment/Exercise - 10/08/20 1413      Transfers   Transfers Sit to Stand;Stand to Sit    Sit to Stand 5: Supervision    Five time sit to stand comments  16.69 sec's no UE support from standard height surface    Stand to Sit 5: Supervision      Ambulation/Gait   Ambulation/Gait Yes    Ambulation/Gait Assistance 5: Supervision    Ambulation/Gait Assistance Details cues needed to stay closer to rollator  with gait.     Ambulation Distance (Feet) 210 Feet    Assistive device Rollator    Gait Pattern Step-through pattern;Decreased step length - right;Decreased stance time - left;Decreased hip/knee flexion - left;Decreased dorsiflexion - left;Decreased weight shift to left;Left genu recurvatum;Poor foot clearance - left;Left foot flat    Ambulation Surface Level;Indoor    Gait velocity 14.75= 2.22 ft/sec with rollator      Neuro Re-ed    Neuro Re-ed Details  for strengthening/muscle re-ed: seated in staggered stance with right foot forward/left foot backwards for sit<>stands x 10 reps, cues for tall posture; with 4 inch box: left foot on box for step ups x 10 reps with UE support on sturdy surface; with right foot on box for left LE mini squats with assist at knee for stability x 10 reps, ; on airex no UE support feet hip width apart: EC 30 sec's x 3 reps, min guard to min assist for balance.                   PT Short Term Goals - 10/08/20 1410      PT  SHORT TERM GOAL #1   Title Pt will undergo further assessment of BERG -LTG written as appropriate. ALL STGS DUE 10/09/20    Baseline 29/56    Status Achieved    Target Date 10/09/20      PT SHORT TERM GOAL #2   Title Pt will undergo further assessment of AFO for improved gait mechanics.    Baseline visit with orthotist on 10/03/20    Status Achieved      PT SHORT TERM GOAL #3   Title Pt will improve gait sped to at least 1.8 ft/sec with small base quad in order to decr fall risk.    Baseline 1.58 ft/sec    Time 4    Period Weeks    Status New      PT SHORT TERM GOAL #4   Title Pt will decr TUG to 17 seconds or less with small based quad cane in order to demo decr fall risk.    Baseline 19.09 seconds    Time 4    Period Weeks    Status New      PT SHORT TERM GOAL #5   Title Pt will improve 5x sit <> stand time with no UE support in 17 seconds or less in order to demo improved BLE strength.    Baseline 20.22 seconds with no UE support    Time 4    Period Weeks    Status New             PT Long Term Goals - 10/06/20 1652      PT LONG TERM GOAL #1   Title Pt will be independent with final HEP in order to build upon functional gains made in therapy. ALL LTGS DUE 11/06/20    Time 8    Period Weeks    Status New      PT LONG TERM GOAL #2   Title Pt will improve BERG to at least a 36/56 in order to demo decr fall risk.    Baseline 29/56    Time 8    Period Weeks    Status Revised      PT LONG TERM GOAL #3   Title Pt will improve gait sped to at least 2.2 ft/sec with small base quad in order to improve gait efficiency.    Baseline 1.58 ft/sec  Time 8    Period Weeks    Status New      PT LONG TERM GOAL #4   Title Pt will decr TUG to 15 seconds or less with small based quad cane in order to demo decr fall risk.    Baseline 19.09 seconds    Time 8    Period Weeks    Status New      PT LONG TERM GOAL #5   Title Pt will ambulate at least 300' over indoor and  outdoor unlevel surfaces with supervision with small base quad cane in order to improve functional mobility.    Time 8    Period Weeks    Status New                 Plan - 10/08/20 1405    Clinical Impression Statement Today's skilled session focused on progess toward STGs with remaining goals met. Remainder of session continued to focus on LE strengthening and balance training on compliant surfaces. No issues noted or reported in session. The pt is progressing toward goals and should benefit from continued PT to progress toward unmet goals.    Personal Factors and Comorbidities Time since onset of injury/illness/exacerbation;Past/Current Experience;Fitness    Comorbidities h/o Rt CVA in 2007; anxiety & depression, HTN, mild to moderate aortic insufficiency    Examination-Activity Limitations Squat;Transfers;Stand;Locomotion Level    Examination-Participation Restrictions Estate agent;Shop    Stability/Clinical Decision Making Stable/Uncomplicated    Rehab Potential Good    PT Frequency 2x / week    PT Duration 8 weeks    PT Treatment/Interventions ADLs/Self Care Home Management;Therapeutic exercise;Therapeutic activities;Patient/family education;Passive range of motion;DME Instruction;Gait training;Functional mobility training;Stair training;Balance training;Neuromuscular re-education;Orthotic Fit/Training;Vestibular;Energy conservation    PT Next Visit Plan Did pt have her face to face, if not set up may need to call MD office due to miscommunication as they think she is still asking for the order. standing balance with UE support (add to HEP as needed), continue to work on LE strengthening    Consulted and Agree with Plan of Care Patient           Patient will benefit from skilled therapeutic intervention in order to improve the following deficits and impairments:  Decreased strength, Decreased range of motion, Decreased activity tolerance, Abnormal gait,  Decreased balance, Decreased coordination, Difficulty walking, Dizziness, Impaired sensation, Impaired tone  Visit Diagnosis: Unsteadiness on feet  Other abnormalities of gait and mobility  Hemiparesis affecting left side as late effect of cerebrovascular accident [facility])     Problem List Patient Active Problem List   Diagnosis Date Noted   54 04/03/2020   Nutritional counseling 01/19/2018   Primary osteoarthritis of left knee 01/03/2016   Mild to Moderate Aortic Insufficiency    Hypertension    Hyperlipidemia    Morbid obesity ([place])    Pulmonary hypertension ([place]) 01/30/2013   Orthostatic hypotension 12/18/2012   Aortic valve disorder 07/29/2010   CHEST PAIN-PRECORDIAL 07/29/2010   CYST OF THYROID 01/06/2010   HYPOKALEMIA 01/06/2010   HYPERLIPIDEMIA 12/24/2009   OBESITY 12/24/2009   HYPERTENSION, UNSPECIFIED 12/24/2009   Personal history of unspecified circulatory disease 12/24/2009   PEPTIC ULCER DISEASE, HX OF 12/24/2009    Willow Ora, PTA, [facility] [address], [place],  [address] [phone] 10/10/20, 8:12 AM   Name: Chelsea [name] MRN: [id number] Date of Birth: [birth year]

## 2020-10-10 ENCOUNTER — Other Ambulatory Visit: Payer: Self-pay

## 2020-10-10 ENCOUNTER — Encounter: Payer: Self-pay | Admitting: Pharmacist

## 2020-10-10 ENCOUNTER — Ambulatory Visit (INDEPENDENT_AMBULATORY_CARE_PROVIDER_SITE_OTHER): Payer: Medicare Other | Admitting: Pharmacist

## 2020-10-10 VITALS — BP 134/76

## 2020-10-10 DIAGNOSIS — I10 Essential (primary) hypertension: Secondary | ICD-10-CM

## 2020-10-10 NOTE — Patient Instructions (Signed)
It was pleasure seeing you today!    Continue taking your current medications: Exforge, metoprolol succinate, doxazosin, indapamide, and spironolactone.   Try to make your own [place] for sandwiches in the air fryer, deli meats can increase blood pressure.   Try to use olive oil to cook instead of butter and vegetable shortening to help with cholesterol.   Continue using nicotine patches and calling your brother or playing game when you get frustrated to prevent temptation to smoke

## 2020-10-10 NOTE — Telephone Encounter (Signed)
°   Chantrell Apsey DOB: [birth year] MRN: [id number]   RIDER WAIVER AND RELEASE OF LIABILITY  For purposes of improving physical access to our facilities, [facility] is pleased to partner with third parties to provide [facility] patients or other authorized individuals the option of convenient, on-demand ground transportation services (the [employer]) through use of the technology service that enables users to request on-demand ground transportation from independent third-party providers.  By opting to use and accept these [employer], I, the undersigned, hereby agree on behalf of myself, and on behalf of any minor child using the [employer] for whom I am the parent or legal guardian, as follows:  1. Government social research officer provided to me are provided by independent third-party transportation providers who are not [employer] or employees and who are unaffiliated with [employer]. 2. [facility] is neither a transportation carrier nor a common or public carrier. 3. [facility] has no control over the quality or safety of the transportation that occurs as a result of the [employer]. 4. [facility] cannot guarantee that any third-party transportation provider will complete any arranged transportation service. 5. [facility] makes no representation, warranty, or guarantee regarding the reliability, timeliness, quality, safety, suitability, or availability of any of the Transport Services or that they will be error free. 6. I fully understand that traveling by vehicle involves risks and dangers of serious bodily injury, including permanent disability, paralysis, and death. I agree, on behalf of myself and on behalf of any minor child using the Transport Services for whom I am the parent or legal guardian, that the entire risk arising out of my use of the [employer] remains solely with me, to the maximum extent permitted under applicable law. 7. The [employer] are provided as is and as available. [facility] disclaims all representations and warranties, express, implied or statutory, not expressly set out in these terms, including the implied warranties of merchantability and fitness for a particular purpose. 8. I hereby waive and release [facility], its agents, employees, officers, directors, representatives, insurers, attorneys, assigns, successors, subsidiaries, and affiliates from any and all past, present, or future claims, demands, liabilities, actions, causes of action, or suits of any kind directly or indirectly arising from acceptance and use of the [employer]. 9. I further waive and release [facility] and its affiliates from all present and future liability and responsibility for any injury or death to persons or damages to property caused by or related to the use of the [employer]. 10. I have read this Waiver and Release of Liability, and I understand the terms used in it and their legal significance. This Waiver is freely and voluntarily given with the understanding that my right (as well as the right of any minor child for whom I am the parent or legal guardian using the [employer]) to legal recourse against [facility] in connection with the [employer] is knowingly surrendered in return for use of these services.   I attest that I read the consent document to Sarina Ill, gave Ms. Lippman the opportunity to ask questions and answered the questions asked (if any). I affirm that Sarina Ill then provided consent for she's participation in this program.     Drucie Ip

## 2020-10-13 ENCOUNTER — Encounter: Payer: Self-pay | Admitting: Occupational Therapy

## 2020-10-13 ENCOUNTER — Other Ambulatory Visit: Payer: Self-pay

## 2020-10-13 ENCOUNTER — Telehealth: Payer: Self-pay | Admitting: Physical Therapy

## 2020-10-13 ENCOUNTER — Encounter: Payer: Self-pay | Admitting: Physical Therapy

## 2020-10-13 ENCOUNTER — Ambulatory Visit: Payer: Medicare Other | Admitting: Physical Therapy

## 2020-10-13 ENCOUNTER — Ambulatory Visit: Payer: Medicare Other | Admitting: Occupational Therapy

## 2020-10-13 VITALS — BP 118/80 | HR 57

## 2020-10-13 DIAGNOSIS — R278 Other lack of coordination: Secondary | ICD-10-CM

## 2020-10-13 DIAGNOSIS — M6281 Muscle weakness (generalized): Secondary | ICD-10-CM

## 2020-10-13 DIAGNOSIS — I69854 Hemiplegia and hemiparesis following other cerebrovascular disease affecting left non-dominant side: Secondary | ICD-10-CM | POA: Diagnosis not present

## 2020-10-13 DIAGNOSIS — R2689 Other abnormalities of gait and mobility: Secondary | ICD-10-CM

## 2020-10-13 DIAGNOSIS — R2681 Unsteadiness on feet: Secondary | ICD-10-CM

## 2020-10-13 DIAGNOSIS — I69354 Hemiplegia and hemiparesis following cerebral infarction affecting left non-dominant side: Secondary | ICD-10-CM

## 2020-10-13 DIAGNOSIS — R41840 Attention and concentration deficit: Secondary | ICD-10-CM

## 2020-10-13 NOTE — Therapy (Signed)
[facility] [address] [place], [place], [address] Phone: [phone]   Fax:  [phone]  Occupational Therapy Treatment  Patient Details  Name: Chelsea [name] MRN: [id number] Date of Birth: [birth year] Referring Provider (OT): Star Age, MD   Encounter Date: 10/13/2020   OT End of Session - 10/13/20 1447    Visit Number 3    Number of Visits 17    Date for OT Re-Evaluation 12/01/20    Authorization Type UHC Medicare/Medicaid    Authorization Time Period No Auth Req covered 100%    Authorization - Visit Number 3    Authorization - Number of Visits 10    Progress Note Due on Visit 10    OT Start Time 1447    OT Stop Time 1530    OT Time Calculation (min) 43 min    Activity Tolerance Patient tolerated treatment well    Behavior During Therapy [facility] for tasks assessed/performed           Past Medical History:  Diagnosis Date  . Anxiety   . Cerebrovascular accident [facility])    a. 2007-->residual left sided weakness  . Chest pain   . Depression   . GERD (gastroesophageal reflux disease)   . Headache   . Hx of hysterectomy   . Hyperlipidemia   . Hypertension   . Mild to Moderate Aortic Insufficiency    a. 08/2014 Echo: EF 55-60%, mild conc LVH, no rwma, Gr 1 DD, mild-mod AI, mild MR.  . Morbid obesity ([place])   . Obesity   . Peptic ulcer disease     Past Surgical History:  Procedure Laterality Date  . ABDOMINAL HYSTERECTOMY    . TOTAL KNEE ARTHROPLASTY Left 01/05/2016   Procedure: TOTAL KNEE ARTHROPLASTY;  Surgeon: Frederik Pear, MD;  Location: [facility];  Service: Orthopedics;  Laterality: Left;  . TUBAL LIGATION      There were no vitals filed for this visit.   Subjective Assessment - 10/13/20 1448    Subjective  Pt denies any changes or pain today. Pt reports she found some weighted utensil on [employer] and may buy the whole set.    Patient is accompanied by: --   none   Pertinent History PMH is  significant for: h/o Rt CVA in 2007; anxiety & depression, HTN, mild to moderate aortic insufficiency.    Limitations Fall Risk. High BP controlled with meds.    Patient Stated Goals play my games, want some feeling in my hand (LUE),    Currently in Pain? No/denies                        OT Treatments/Exercises (OP) - 10/13/20 1457      ADLs   Cooking pt simulated cooking something in the oven with mod I. Pt utilizes rollator for sitting with placing things in and out of the oven. Pt demonsrated safe handling techniques and procedures    Home Maintenance folding laundry with functional use of LUE approx 10%. Pt with limited use of LUE but only for stabilizer      Exercises   Exercises Hand      Fine Motor Coordination (Hand/Wrist)   Fine Motor Coordination Large Pegboard    Large Pegboard removing pegs with LUE with 1 drop. Pt with poor coordination with placing pegs in board but was better with removing and placing in bucket.  OT Short Term Goals - 10/13/20 1449      OT SHORT TERM GOAL #1   Title Pt will be independent with HEP 11/03/20    Baseline none issued yet    Time 4    Period Weeks    Status On-going    Target Date 11/03/20      OT SHORT TERM GOAL #2   Title Pt will verbalize understanding of sensory strategies to increase safety for cooking and other ADLs and IADLs.    Baseline have not reviewed yet    Time 4    Period Weeks    Status On-going      OT SHORT TERM GOAL #3   Title Pt will demontrate improved standing tolerance by completing activity in standing for 10 minutes or greater with pain less than or equal to 6/10    Baseline back pain 8/10 standing 5-10 minutes    Time 4    Period Weeks    Status New      OT SHORT TERM GOAL #4   Title Pt will report increased ease with manipulating fasteners and tying shoes with A/E prn.    Baseline increased time and difficulty    Time 4    Period Weeks    Status New       OT SHORT TERM GOAL #5   Title Pt will demonstrate improved functional use of LUE evidenced by Box and Blocks score of 16 or more.    Baseline 11    Time 4    Period Weeks    Status New             OT Long Term Goals - 10/06/20 1823      OT LONG TERM GOAL #1   Title Pt will be independent with updated HEP 12/01/2020    Baseline none    Time 8    Period Weeks    Status New    Target Date 12/01/20      OT LONG TERM GOAL #2   Title Pt will report functional use of LUE during basic ADLs and IADLs at least 50% of tasks requiring non dominant side.    Baseline 10-15%    Time 8    Period Weeks    Status New      OT LONG TERM GOAL #3   Title Pt will improve functional use of LUE as evidenced by performing 20 blocks on Box & Blocks test    Baseline 11    Time 8    Period Weeks    Status New      OT LONG TERM GOAL #4   Title Pt will demontrate improved activity tolerance by completing activity in standing for 15 minutes or greater with pain less than or equal to 5/10    Baseline 8/10 with standing 5-10 minutes    Time 8    Period Weeks    Status New      OT LONG TERM GOAL #5   Title Pt will complete a mod complex cooking task or home management task with mod I and good safety awareness with reports of increased ease and use of adapted strategies and/or equipment PRN    Baseline diff with sweeping and standing for cooking etc.    Time 8    Period Weeks    Status New                 Plan - 10/13/20 1803    Clinical  Impression Statement Pt continues to progress towards goals.    OT Occupational Profile and History Problem Focused Assessment - Including review of records relating to presenting problem    Occupational performance deficits (Please refer to evaluation for details): ADL's;IADL's;Leisure    Body Structure / Function / Physical Skills ADL;Decreased knowledge of use of DME;Strength;Tone;Pain;GMC;Dexterity;Balance;Proprioception;UE functional  use;ROM;IADL;Sensation;Flexibility;Coordination;FMC    Cognitive Skills Problem Solve;Perception    Rehab Potential Fair    Clinical Decision Making Limited treatment options, no task modification necessary    Comorbidities Affecting Occupational Performance: May have comorbidities impacting occupational performance    Modification or Assistance to Complete Evaluation  No modification of tasks or assist necessary to complete eval    OT Frequency 2x / week    OT Duration 8 weeks    OT Treatment/Interventions Self-care/ADL training;DME and/or AE instruction;Balance training;Therapeutic Air traffic controller;Therapeutic exercise;Cognitive remediation/compensation;Passive range of motion;Functional Mobility Training;Neuromuscular education;Manual Therapy;Patient/family education;Energy conservation    Plan sensory strategies, home management tasks    Consulted and Agree with Plan of Care Patient           Patient will benefit from skilled therapeutic intervention in order to improve the following deficits and impairments:   Body Structure / Function / Physical Skills: ADL, Decreased knowledge of use of DME, Strength, Tone, Pain, GMC, Dexterity, Balance, Proprioception, UE functional use, ROM, IADL, Sensation, Flexibility, Coordination, FMC Cognitive Skills: Problem Solve, Perception     Visit Diagnosis: Other abnormalities of gait and mobility  Attention and concentration deficit  Muscle weakness (generalized)  Other lack of coordination  Hemiparesis affecting left side as late effect of cerebrovascular accident [facility])    Problem List Patient Active Problem List   Diagnosis Date Noted  . 54 04/03/2020  . Nutritional counseling 01/19/2018  . Primary osteoarthritis of left knee 01/03/2016  . Mild to Moderate Aortic Insufficiency   . Hypertension   . Hyperlipidemia   . Morbid obesity ([place])   . Pulmonary hypertension ([place]) 01/30/2013  . Orthostatic hypotension 12/18/2012  .  Aortic valve disorder 07/29/2010  . CHEST PAIN-PRECORDIAL 07/29/2010  . CYST OF THYROID 01/06/2010  . HYPOKALEMIA 01/06/2010  . HYPERLIPIDEMIA 12/24/2009  . OBESITY 12/24/2009  . HYPERTENSION, UNSPECIFIED 12/24/2009  . Personal history of unspecified circulatory disease 12/24/2009  . PEPTIC ULCER DISEASE, HX OF 12/24/2009    Zachery Conch MOT, OTR/L  10/13/2020, 6:05 PM  [facility] [address] [place], [place], [address] Phone: [phone]   Fax:  [phone]  Name: Chelsea [name] MRN: [id number] Date of Birth: [birth year]

## 2020-10-13 NOTE — Telephone Encounter (Signed)
Dr. Rexene Alberts,  Thank you so much for submitting the order for Dior Dominik to receive a L AFO for her gait. She still needs to schedule an appointment with you for a face to face visit (either virtual or in the office) in order to have written documentation for need of a L AFO for insurance purposes in your note.  She will be calling again soon to get this scheduled. Please let me know if you have any questions.  Thank you,    Janann August, PT, DPT 10/13/20 2:10 PM     [facility]  [address] [place], [place]  [address] Phone:  [phone] Fax:  [phone]

## 2020-10-13 NOTE — Therapy (Signed)
[facility] [address] [place], [place], [address] Phone: [phone]   Fax:  [phone]  Physical Therapy Treatment  Patient Details  Name: Chelsea [name] MRN: [id number] Date of Birth: 1951/08/15 Referring Provider (PT): Star Age, MD   Encounter Date: 10/13/2020   PT End of Session - 10/13/20 2144    Visit Number 8    Number of Visits 17    Date for PT Re-Evaluation 12/10/20   written for 60 day POC   Authorization Type UHC Medicare & Medicaid    PT Start Time 1405    PT Stop Time 1444    PT Time Calculation (min) 39 min    Equipment Utilized During Treatment Gait belt    Activity Tolerance Patient tolerated treatment well    Behavior During Therapy [facility] for tasks assessed/performed           Past Medical History:  Diagnosis Date  . Anxiety   . Cerebrovascular accident [facility])    a. 2007-->residual left sided weakness  . Chest pain   . Depression   . GERD (gastroesophageal reflux disease)   . Headache   . Hx of hysterectomy   . Hyperlipidemia   . Hypertension   . Mild to Moderate Aortic Insufficiency    a. 08/2014 Echo: EF 55-60%, mild conc LVH, no rwma, Gr 1 DD, mild-mod AI, mild MR.  . Morbid obesity ([place])   . Obesity   . Peptic ulcer disease     Past Surgical History:  Procedure Laterality Date  . ABDOMINAL HYSTERECTOMY    . TOTAL KNEE ARTHROPLASTY Left 01/05/2016   Procedure: TOTAL KNEE ARTHROPLASTY;  Surgeon: Frederik Pear, MD;  Location: [facility];  Service: Orthopedics;  Laterality: Left;  . TUBAL LIGATION      Vitals:   10/13/20 1415 10/13/20 1419  BP: (!) 94/59 118/80  Pulse: (!) 57      Subjective Assessment - 10/13/20 1413    Subjective Feeling a little bit more sleepy - has been doing a lot of chores today.    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 No limitation    How long can you stand comfortably? No limitation    How long can you walk comfortably? No limitation    Patient Stated Goals wants to get strength back in arms and legs.    Currently in Pain? No/denies                                  Balance Exercises - 10/13/20 0001      Balance Exercises: Standing   SLS --    Rockerboard Anterior/posterior;EO;Limitations    Rockerboard Limitations standing in A/P direction: with BUE support weight shifting x12 reps - verbal and demo cues for proper technique, holding balance on board still with single/no UE support 4 x 15 seconds, min guard/min A for balance   Step Ups Forward;Lateral;4 inch;UE support 2;Limitations    Step Ups Limitations cues for technique, x10 reps leading with LLE, needing assist at times with forward step ups to prevent L genu recurvatum.    Other Standing Exercises with single UE support, shifting weight to LLE and tapping R foot to 4"  step x10 reps - verbal and demo cues for glute activation with SLS, then performing a couple reps letting go with RLE propped up on step for modified SLS on LLE x5 reps holding approx. 3-5 seconds each.    Other Standing Exercises Comments with RUE support, lifting LLE up to 6" step 2 x 10  For incr hip/knee flexion, cues to fully lift LLE when stepping back to floor             PT Education - 10/13/20 2144    Education Details with pt present, sent additional message to Dr. Rexene Alberts about need for face to face visit (in person or virtual) for insurance purposes for AFO.    Person(s) Educated Patient    Methods Explanation    Comprehension Verbalized understanding            PT Short Term Goals - 10/08/20 1410      PT SHORT TERM GOAL #1   Title Pt will undergo further assessment of BERG -LTG written as appropriate. ALL STGS DUE 10/09/20    Baseline 29/56     Status Achieved    Target Date 10/09/20      PT SHORT TERM GOAL #2   Title Pt will undergo further assessment of AFO for improved gait mechanics.    Baseline visit with orthotist on 10/03/20    Status Achieved      PT SHORT TERM GOAL #3   Title Pt will improve gait sped to at least 1.8 ft/sec with small base quad in order to decr fall risk.    Baseline 10/08/20: 2.22 ft/sec wtih rollator    Time --    Period --    Status Achieved      PT SHORT TERM GOAL #4   Title Pt will decr TUG to 17 seconds or less with small based quad cane in order to demo decr fall risk.    Baseline 10/08/20: 14.88 sec's with rollator    Time --    Period --    Status Achieved      PT SHORT TERM GOAL #5   Title Pt will improve 5x sit <> stand time with no UE support in 17 seconds or less in order to demo improved BLE strength.    Baseline 10/08/20: 16.69 sec's no UE support from standard height surface    Time --    Period --    Status Achieved             PT Long Term Goals - 10/06/20 1652      PT LONG TERM GOAL #1   Title Pt will be independent with final HEP in order to build upon functional gains made in therapy. ALL LTGS DUE 11/06/20    Time 8    Period Weeks    Status New      PT LONG TERM GOAL #2   Title Pt will improve BERG to at least a 36/56 in order to demo decr fall risk.    Baseline 29/56    Time 8    Period Weeks    Status Revised      PT LONG TERM GOAL #3   Title Pt will improve gait sped to at least 2.2 ft/sec with small base quad in order to improve gait efficiency.    Baseline 1.58 ft/sec    Time 8    Period Weeks    Status New      PT LONG TERM GOAL #4  Title Pt will decr TUG to 15 seconds or less with small based quad cane in order to demo decr fall risk.    Baseline 19.09 seconds    Time 8    Period Weeks    Status New      PT LONG TERM GOAL #5   Title Pt will ambulate at least 300' over indoor and outdoor unlevel surfaces with supervision with small base quad  cane in order to improve functional mobility.    Time 8    Period Weeks    Status New                 Plan - 10/13/20 2145    Clinical Impression Statement Today's skilled session focused on standing balance strategies, and LLE strengthening. Pt needing maual and tactile cues at times to prevent L genu recurvatum during standing weight shifting/balance activities. Will continue to progress towards LTGs.    Personal Factors and Comorbidities Time since onset of injury/illness/exacerbation;Past/Current Experience;Fitness    Comorbidities h/o Rt CVA in 2007; anxiety & depression, HTN, mild to moderate aortic insufficiency    Examination-Activity Limitations Squat;Transfers;Stand;Locomotion Level    Examination-Participation Restrictions Estate agent;Shop    Stability/Clinical Decision Making Stable/Uncomplicated    Rehab Potential Good    PT Frequency 2x / week    PT Duration 8 weeks    PT Treatment/Interventions ADLs/Self Care Home Management;Therapeutic exercise;Therapeutic activities;Patient/family education;Passive range of motion;DME Instruction;Gait training;Functional mobility training;Stair training;Balance training;Neuromuscular re-education;Orthotic Fit/Training;Vestibular;Energy conservation    PT Next Visit Plan balance strategies on compliant surfaces, standing balance with UE support (add to HEP as needed), continue to work on LE strengthening    Consulted and Agree with Plan of Care Patient           Patient will benefit from skilled therapeutic intervention in order to improve the following deficits and impairments:  Decreased strength, Decreased range of motion, Decreased activity tolerance, Abnormal gait, Decreased balance, Decreased coordination, Difficulty walking, Dizziness, Impaired sensation, Impaired tone  Visit Diagnosis: Other abnormalities of gait and mobility  Muscle weakness (generalized)  Other lack of  coordination  Unsteadiness on feet     Problem List Patient Active Problem List   Diagnosis Date Noted  . 54 04/03/2020  . Nutritional counseling 01/19/2018  . Primary osteoarthritis of left knee 01/03/2016  . Mild to Moderate Aortic Insufficiency   . Hypertension   . Hyperlipidemia   . Morbid obesity ([place])   . Pulmonary hypertension ([place]) 01/30/2013  . Orthostatic hypotension 12/18/2012  . Aortic valve disorder 07/29/2010  . CHEST PAIN-PRECORDIAL 07/29/2010  . CYST OF THYROID 01/06/2010  . HYPOKALEMIA 01/06/2010  . HYPERLIPIDEMIA 12/24/2009  . OBESITY 12/24/2009  . HYPERTENSION, UNSPECIFIED 12/24/2009  . Personal history of unspecified circulatory disease 12/24/2009  . PEPTIC ULCER DISEASE, HX OF 12/24/2009    Arliss Journey, PT, DPT  10/13/2020, 9:47 PM  [facility] [address] [place], [place], [address] Phone: [phone]   Fax:  [phone]  Name: Chelsea [name] MRN: [id number] Date of Birth: [birth year]

## 2020-10-15 ENCOUNTER — Telehealth: Payer: Self-pay | Admitting: Neurology

## 2020-10-15 ENCOUNTER — Other Ambulatory Visit: Payer: Self-pay

## 2020-10-15 ENCOUNTER — Ambulatory Visit: Payer: Medicare Other | Admitting: Occupational Therapy

## 2020-10-15 ENCOUNTER — Ambulatory Visit: Payer: Medicare Other | Admitting: Physical Therapy

## 2020-10-15 DIAGNOSIS — R2689 Other abnormalities of gait and mobility: Secondary | ICD-10-CM

## 2020-10-15 DIAGNOSIS — R278 Other lack of coordination: Secondary | ICD-10-CM

## 2020-10-15 DIAGNOSIS — R2681 Unsteadiness on feet: Secondary | ICD-10-CM

## 2020-10-15 DIAGNOSIS — I69854 Hemiplegia and hemiparesis following other cerebrovascular disease affecting left non-dominant side: Secondary | ICD-10-CM | POA: Diagnosis not present

## 2020-10-15 DIAGNOSIS — M6281 Muscle weakness (generalized): Secondary | ICD-10-CM | POA: Diagnosis not present

## 2020-10-15 DIAGNOSIS — I69354 Hemiplegia and hemiparesis following cerebral infarction affecting left non-dominant side: Secondary | ICD-10-CM | POA: Diagnosis not present

## 2020-10-15 NOTE — Therapy (Signed)
[facility] [address] [place], [place], [address] Phone: [phone]   Fax:  [phone]  Occupational Therapy Treatment  Patient Details  Name: Chelsea [name] MRN: [id number] Date of Birth: 29-Oct-1951 Referring Provider (OT): Star Age, MD   Encounter Date: 10/15/2020   OT End of Session - 10/15/20 1532    Visit Number 4    Number of Visits 17    Date for OT Re-Evaluation 12/01/20    Authorization Type UHC Medicare/Medicaid    Authorization Time Period No Auth Req covered 100%    Authorization - Visit Number 4    Authorization - Number of Visits 10    Progress Note Due on Visit 10    OT Start Time 1532    OT Stop Time 1611    OT Time Calculation (min) 39 min    Activity Tolerance Patient tolerated treatment well    Behavior During Therapy [facility] for tasks assessed/performed           Past Medical History:  Diagnosis Date  . Anxiety   . Cerebrovascular accident [facility])    a. 2007-->residual left sided weakness  . Chest pain   . Depression   . GERD (gastroesophageal reflux disease)   . Headache   . Hx of hysterectomy   . Hyperlipidemia   . Hypertension   . Mild to Moderate Aortic Insufficiency    a. 08/2014 Echo: EF 55-60%, mild conc LVH, no rwma, Gr 1 DD, mild-mod AI, mild MR.  . Morbid obesity ([place])   . Obesity   . Peptic ulcer disease     Past Surgical History:  Procedure Laterality Date  . ABDOMINAL HYSTERECTOMY    . TOTAL KNEE ARTHROPLASTY Left 01/05/2016   Procedure: TOTAL KNEE ARTHROPLASTY;  Surgeon: Frederik Pear, MD;  Location: [facility];  Service: Orthopedics;  Laterality: Left;  . TUBAL LIGATION      There were no vitals filed for this visit.   Subjective Assessment - 10/15/20 1534    Subjective  Pt denies any pain. In response to how PT was - "she was trying to kill me again"    Patient is accompanied by: --   none   Pertinent History PMH is significant for: h/o Rt CVA in 2007;  anxiety & depression, HTN, mild to moderate aortic insufficiency.    Limitations Fall Risk. High BP controlled with meds.    Patient Stated Goals play my games, want some feeling in my hand (LUE),    Currently in Pain? No/denies             Treatment:  Sensory reeducation with different/varying textures on LUE. Pt continues to have limited sensation in Left side.  Placing bean bags in basket with LUE. Pt with good carry over of slowing down and controling movements and increased coordination.   Grasp/Release of large cones with LUE x 8 cones to elevated surface of approx 9" block with elbow/proximal support and increased coordination. Pt completed x 4.  Grasp/Release with 1 inch blocks with LUE with proximal support on table. Pt had easier time with placing on elevated surface than placing in bowl on level surface/table. Much more Difficulty when trying to grab blocks from bowl as opposed to the table/flat surface.                    OT Education - 10/15/20 1551    Education Details Catering manager) Educated Patient  Methods Explanation;Handout    Comprehension Verbalized understanding            OT Short Term Goals - 10/13/20 1449      OT SHORT TERM GOAL #1   Title Pt will be independent with HEP 11/03/20    Baseline none issued yet    Time 4    Period Weeks    Status On-going    Target Date 11/03/20      OT SHORT TERM GOAL #2   Title Pt will verbalize understanding of sensory strategies to increase safety for cooking and other ADLs and IADLs.    Baseline have not reviewed yet    Time 4    Period Weeks    Status On-going      OT SHORT TERM GOAL #3   Title Pt will demontrate improved standing tolerance by completing activity in standing for 10 minutes or greater with pain less than or equal to 6/10    Baseline back pain 8/10 standing 5-10 minutes    Time 4    Period Weeks    Status New      OT SHORT TERM GOAL #4   Title Pt will  report increased ease with manipulating fasteners and tying shoes with A/E prn.    Baseline increased time and difficulty    Time 4    Period Weeks    Status New      OT SHORT TERM GOAL #5   Title Pt will demonstrate improved functional use of LUE evidenced by Box and Blocks score of 16 or more.    Baseline 11    Time 4    Period Weeks    Status New             OT Long Term Goals - 10/06/20 1823      OT LONG TERM GOAL #1   Title Pt will be independent with updated HEP 12/01/2020    Baseline none    Time 8    Period Weeks    Status New    Target Date 12/01/20      OT LONG TERM GOAL #2   Title Pt will report functional use of LUE during basic ADLs and IADLs at least 50% of tasks requiring non dominant side.    Baseline 10-15%    Time 8    Period Weeks    Status New      OT LONG TERM GOAL #3   Title Pt will improve functional use of LUE as evidenced by performing 20 blocks on Box & Blocks test    Baseline 11    Time 8    Period Weeks    Status New      OT LONG TERM GOAL #4   Title Pt will demontrate improved activity tolerance by completing activity in standing for 15 minutes or greater with pain less than or equal to 5/10    Baseline 8/10 with standing 5-10 minutes    Time 8    Period Weeks    Status New      OT LONG TERM GOAL #5   Title Pt will complete a mod complex cooking task or home management task with mod I and good safety awareness with reports of increased ease and use of adapted strategies and/or equipment PRN    Baseline diff with sweeping and standing for cooking etc.    Time 8    Period Weeks    Status New  Plan - 10/15/20 1549    Clinical Impression Statement Pt continues to progress towards goals. Pt improving with coordination with slowing down and controlling movements with more smooth motion with LUE    OT Occupational Profile and History Problem Focused Assessment - Including review of records relating to presenting  problem    Occupational performance deficits (Please refer to evaluation for details): ADL's;IADL's;Leisure    Body Structure / Function / Physical Skills ADL;Decreased knowledge of use of DME;Strength;Tone;Pain;GMC;Dexterity;Balance;Proprioception;UE functional use;ROM;IADL;Sensation;Flexibility;Coordination;FMC    Cognitive Skills Problem Solve;Perception    Rehab Potential Fair    Clinical Decision Making Limited treatment options, no task modification necessary    Comorbidities Affecting Occupational Performance: May have comorbidities impacting occupational performance    Modification or Assistance to Complete Evaluation  No modification of tasks or assist necessary to complete eval    OT Frequency 2x / week    OT Duration 8 weeks    OT Treatment/Interventions Self-care/ADL training;DME and/or AE instruction;Balance training;Therapeutic Air traffic controller;Therapeutic exercise;Cognitive remediation/compensation;Passive range of motion;Functional Mobility Training;Neuromuscular education;Manual Therapy;Patient/family education;Energy conservation    Plan standing tolerance and balance    Consulted and Agree with Plan of Care Patient           Patient will benefit from skilled therapeutic intervention in order to improve the following deficits and impairments:   Body Structure / Function / Physical Skills: ADL, Decreased knowledge of use of DME, Strength, Tone, Pain, GMC, Dexterity, Balance, Proprioception, UE functional use, ROM, IADL, Sensation, Flexibility, Coordination, FMC Cognitive Skills: Problem Solve, Perception     Visit Diagnosis: Other abnormalities of gait and mobility  Muscle weakness (generalized)  Unsteadiness on feet  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 54 04/03/2020  . Nutritional  counseling 01/19/2018  . Primary osteoarthritis of left knee 01/03/2016  . Mild to Moderate Aortic Insufficiency   . Hypertension   . Hyperlipidemia   . Morbid obesity ([place])   . Pulmonary hypertension ([place]) 01/30/2013  . Orthostatic hypotension 12/18/2012  . Aortic valve disorder 07/29/2010  . CHEST PAIN-PRECORDIAL 07/29/2010  . CYST OF THYROID 01/06/2010  . HYPOKALEMIA 01/06/2010  . HYPERLIPIDEMIA 12/24/2009  . OBESITY 12/24/2009  . HYPERTENSION, UNSPECIFIED 12/24/2009  . Personal history of unspecified circulatory disease 12/24/2009  . PEPTIC ULCER DISEASE, HX OF 12/24/2009    Zachery Conch MOT, OTR/L  10/15/2020, 4:47 PM  [facility] [address] [place], [place], [address] Phone: [phone]   Fax:  [phone]  Name: Chelsea [name] MRN: [id number] Date of Birth: March 22, 1951

## 2020-10-15 NOTE — Telephone Encounter (Signed)
I called Chelsea [name]. Pt had a recent office visit on 08/11/2020 that discussed the need for a brace. Unfortunately, this documentation will not work. The note needs to discuss why pt needs a new AFO to improve mechanics and safety in gait to reduce and prevent falls.

## 2020-10-15 NOTE — Telephone Encounter (Signed)
I made an addendum to the office note from September.  Please check if this works, otherwise please offer appointment with nurse practitioner to document separately the need for a new AFO.

## 2020-10-15 NOTE — Telephone Encounter (Signed)
Dr. Rexene Alberts,  We believe that should work. If not, we will let you know. Thanks so much!  Janann August, PT, DPT 10/15/20 12:27 PM

## 2020-10-15 NOTE — Telephone Encounter (Signed)
I spoke with Chelsea [name]. Neuro rehab has faxed the AFO order to [facility].  I called pt to discuss. No answer, left a message asking her to call us back. If pt calls back please advise her of this and also ask her to contact neuro rehab for further questions regarding her AFO.

## 2020-10-15 NOTE — Telephone Encounter (Signed)
Chloe from [facility] stated that pt. needs a visit for insurance to cover AFO.  Chloe's contact number: [phone]

## 2020-10-15 NOTE — Therapy (Signed)
[facility] [address] [place], [place], [address] Phone: [phone]   Fax:  [phone]  Physical Therapy Treatment  Patient Details  Name: Chelsea [name] MRN: [id number] Date of Birth: [birth year] Referring Provider (PT): Star Age, MD   Encounter Date: 10/15/2020   PT End of Session - 10/15/20 1529    Visit Number 9    Number of Visits 17    Date for PT Re-Evaluation 12/10/20   written for 60 day POC   Authorization Type UHC Medicare & Medicaid    PT Start Time 1445    PT Stop Time 1527    PT Time Calculation (min) 42 min    Equipment Utilized During Treatment Gait belt    Activity Tolerance Patient tolerated treatment well    Behavior During Therapy WFL for tasks assessed/performed           Past Medical History:  Diagnosis Date   Anxiety    Cerebrovascular accident [facility])    a. 2007-->residual left sided weakness   Chest pain    Depression    GERD (gastroesophageal reflux disease)    Headache    Hx of hysterectomy    Hyperlipidemia    Hypertension    Mild to Moderate Aortic Insufficiency    a. 08/2014 Echo: EF 55-60%, mild conc LVH, no rwma, Gr 1 DD, mild-mod AI, mild MR.   Morbid obesity ([place])    Obesity    Peptic ulcer disease     Past Surgical History:  Procedure Laterality Date   ABDOMINAL HYSTERECTOMY     TOTAL KNEE ARTHROPLASTY Left 01/05/2016   Procedure: TOTAL KNEE ARTHROPLASTY;  Surgeon: Frederik Pear, MD;  Location: [facility];  Service: Orthopedics;  Laterality: Left;   TUBAL LIGATION      There were no vitals filed for this visit.   Subjective Assessment - 10/15/20 1448    Subjective Was sore after last therapy session.    Pertinent History Rt CVA in 2007 with Lt hemiparesis;  anxiety, depression, mild to moderate aortic insufficiency,  arthritis with s/p L TKA in 2/16, thalamic pain syndrome    Limitations Walking;House hold activities    How long can you sit  comfortably? No limitation    How long can you stand comfortably? No limitation    How long can you walk comfortably? No limitation    Patient Stated Goals wants to get strength back in arms and legs.    Currently in Pain? No/denies                             [facility] Adult PT Treatment/Exercise - 10/15/20 0001      Knee/Hip Exercises: Seated   Long Arc Quad Strengthening;AROM;Left;1 set;10 reps    Long [employer] Limitations with use of red theraband, x 10 reps     Heel Slides Strengthening;Left;2 sets;10 reps    Heel Slides Limitations with intermittent therapist resistance, keeping pt's heel on pillow case to decr friction    Clamshell with TheraBand Red   bringing LLE out and in with 3 second holds 2 x 10 reps              Balance Exercises - 10/15/20 0001      Balance Exercises: Standing   Standing Eyes Opened Foam/compliant surface;Limitations    Standing Eyes Opened Limitations feet hip width distance x10 reps head turns, x10 reps head nods    Standing  Eyes Closed Wide (BOA);Foam/compliant surface;Other reps (comment);30 secs;Limitations;Head turns    Standing Eyes Closed Limitations on airex: 3 x 15-20 second reps with min guard for balance    Retro Gait Upper extremity support;Limitations    Retro Gait Limitations level ground in // bars, cues for wider BOS and step length x3 reps     Marching Upper extremity assist 2;Solid surface;Forwards;Limitations    Marching Limitations 3 reps in // bars, cues for incr foot clearance and slow and controlled for SLS    Other Standing Exercises with BUE support: SLS taps for incr hip/knee flexion to 4" step and with additional 4" beam on top trying not to tip it over - 2 x10 reps with LLE, pt knocking beam over 3-4 times, then performing x10 reps gentle RLE taps for SLS/weight bearing on LLE             PT Education - 10/15/20 1528    Education Details gave pt phone number for [facility] for pt to call and make an  appt for L AFO    Person(s) Educated Patient    Methods Explanation;Handout    Comprehension Verbalized understanding            PT Short Term Goals - 10/08/20 1410      PT SHORT TERM GOAL #1   Title Pt will undergo further assessment of BERG -LTG written as appropriate. ALL STGS DUE 10/09/20    Baseline 29/56    Status Achieved    Target Date 10/09/20      PT SHORT TERM GOAL #2   Title Pt will undergo further assessment of AFO for improved gait mechanics.    Baseline visit with orthotist on 10/03/20    Status Achieved      PT SHORT TERM GOAL #3   Title Pt will improve gait sped to at least 1.8 ft/sec with small base quad in order to decr fall risk.    Baseline 10/08/20: 2.22 ft/sec wtih rollator    Time --    Period --    Status Achieved      PT SHORT TERM GOAL #4   Title Pt will decr TUG to 17 seconds or less with small based quad cane in order to demo decr fall risk.    Baseline 10/08/20: 14.88 sec's with rollator    Time --    Period --    Status Achieved      PT SHORT TERM GOAL #5   Title Pt will improve 5x sit <> stand time with no UE support in 17 seconds or less in order to demo improved BLE strength.    Baseline 10/08/20: 16.69 sec's no UE support from standard height surface    Time --    Period --    Status Achieved             PT Long Term Goals - 10/06/20 1652      PT LONG TERM GOAL #1   Title Pt will be independent with final HEP in order to build upon functional gains made in therapy. ALL LTGS DUE 11/06/20    Time 8    Period Weeks    Status New      PT LONG TERM GOAL #2   Title Pt will improve BERG to at least a 36/56 in order to demo decr fall risk.    Baseline 29/56    Time 8    Period Weeks    Status Revised  PT LONG TERM GOAL #3   Title Pt will improve gait sped to at least 2.2 ft/sec with small base quad in order to improve gait efficiency.    Baseline 1.58 ft/sec    Time 8    Period Weeks    Status New      PT LONG TERM  GOAL #4   Title Pt will decr TUG to 15 seconds or less with small based quad cane in order to demo decr fall risk.    Baseline 19.09 seconds    Time 8    Period Weeks    Status New      PT LONG TERM GOAL #5   Title Pt will ambulate at least 300' over indoor and outdoor unlevel surfaces with supervision with small base quad cane in order to improve functional mobility.    Time 8    Period Weeks    Status New                 Plan - 10/15/20 1531    Clinical Impression Statement Focus of today's skilled session was standing balance stategies and LLE strengthening. Pt needed intermittent rest breaks due to fatigue. Gave pt [facility] phone number to schedule appt for L AFO. Will continue to progress towards LTGs.    Personal Factors and Comorbidities Time since onset of injury/illness/exacerbation;Past/Current Experience;Fitness    Comorbidities h/o Rt CVA in 2007; anxiety & depression, HTN, mild to moderate aortic insufficiency    Examination-Activity Limitations Squat;Transfers;Stand;Locomotion Level    Examination-Participation Restrictions Estate agent;Shop    Stability/Clinical Decision Making Stable/Uncomplicated    Rehab Potential Good    PT Frequency 2x / week    PT Duration 8 weeks    PT Treatment/Interventions ADLs/Self Care Home Management;Therapeutic exercise;Therapeutic activities;Patient/family education;Passive range of motion;DME Instruction;Gait training;Functional mobility training;Stair training;Balance training;Neuromuscular re-education;Orthotic Fit/Training;Vestibular;Energy conservation    PT Next Visit Plan will need 10th visit PN. did pt make appt with [facility]? balance strategies on compliant surfaces, standing balance with UE support (add to HEP as needed), continue to work on LE strengthening    Consulted and Agree with Plan of Care Patient           Patient will benefit from skilled therapeutic intervention in order to improve the  following deficits and impairments:  Decreased strength, Decreased range of motion, Decreased activity tolerance, Abnormal gait, Decreased balance, Decreased coordination, Difficulty walking, Dizziness, Impaired sensation, Impaired tone  Visit Diagnosis: Other abnormalities of gait and mobility  Muscle weakness (generalized)  Unsteadiness on feet  Hemiparesis affecting left side as late effect of cerebrovascular accident [facility])     Problem List Patient Active Problem List   Diagnosis Date Noted   54 04/03/2020   Nutritional counseling 01/19/2018   Primary osteoarthritis of left knee 01/03/2016   Mild to Moderate Aortic Insufficiency    Hypertension    Hyperlipidemia    Morbid obesity ([place])    Pulmonary hypertension ([place]) 01/30/2013   Orthostatic hypotension 12/18/2012   Aortic valve disorder 07/29/2010   CHEST PAIN-PRECORDIAL 07/29/2010   CYST OF THYROID 01/06/2010   HYPOKALEMIA 01/06/2010   HYPERLIPIDEMIA 12/24/2009   OBESITY 12/24/2009   HYPERTENSION, UNSPECIFIED 12/24/2009   Personal history of unspecified circulatory disease 12/24/2009   PEPTIC ULCER DISEASE, HX OF 12/24/2009    Arliss Journey, PT, DPT  10/15/2020, 4:29 PM  [facility] [address] [place], [place], [address] Phone: [phone]   Fax:  [phone]  Name: Chelsea [name] MRN: [id number] Date of Birth: 02-28-1951

## 2020-10-15 NOTE — Telephone Encounter (Signed)
Pt. states [facility] needs a prescription sent over so she can get brace.

## 2020-10-15 NOTE — Telephone Encounter (Signed)
Pt called, wanting to know if physician or NP can give me a written documentation for left custom AFO for leg brace to improve safe walking and balance. Have an appt today at Rehabilitation in the building, want to know if one of them could me today while I am there. Would like a call from the nurse. Informed Pt there was not appt for today, but would send to the nurse.

## 2020-10-15 NOTE — Patient Instructions (Signed)
Sensory Reeducation Strategies  Textured items - rub against LEFT hand. (I.e. towel, toothbrush, spiky sensory balls, etc)  Rice Bin with different items in it for finding objects (paper clip, keys, poker chips, bottle caps, etc)    Tips for safety with sensory deficits:  1. Test hot/cold with Right hand before touching with your Left. 2. Make sure to stay away from radiators, heat or really cold items on the left side 3. Try to keep foods and liquids on the right side to not spill out of mouth or swallow wrong 4. When food or beverage is hot or cold, test with finger on right hand first before trying to eat or drink it.

## 2020-10-15 NOTE — Telephone Encounter (Signed)
I called pt. I advised her that we have been in touch with her PT regarding this issue and Dr. Rexene Alberts has addended her last note so that pt may not need another appt. Pt verbalized understanding.

## 2020-10-21 ENCOUNTER — Ambulatory Visit: Payer: Medicare Other | Admitting: Occupational Therapy

## 2020-10-21 ENCOUNTER — Encounter: Payer: Self-pay | Admitting: Physical Therapy

## 2020-10-21 ENCOUNTER — Other Ambulatory Visit: Payer: Self-pay

## 2020-10-21 ENCOUNTER — Ambulatory Visit: Payer: Medicare Other | Admitting: Physical Therapy

## 2020-10-21 ENCOUNTER — Encounter: Payer: Self-pay | Admitting: Occupational Therapy

## 2020-10-21 DIAGNOSIS — I69854 Hemiplegia and hemiparesis following other cerebrovascular disease affecting left non-dominant side: Secondary | ICD-10-CM

## 2020-10-21 DIAGNOSIS — M6281 Muscle weakness (generalized): Secondary | ICD-10-CM

## 2020-10-21 DIAGNOSIS — R278 Other lack of coordination: Secondary | ICD-10-CM | POA: Diagnosis not present

## 2020-10-21 DIAGNOSIS — R2689 Other abnormalities of gait and mobility: Secondary | ICD-10-CM

## 2020-10-21 DIAGNOSIS — R2681 Unsteadiness on feet: Secondary | ICD-10-CM | POA: Diagnosis not present

## 2020-10-21 DIAGNOSIS — I69354 Hemiplegia and hemiparesis following cerebral infarction affecting left non-dominant side: Secondary | ICD-10-CM | POA: Diagnosis not present

## 2020-10-21 NOTE — Therapy (Signed)
[facility] [address] [place], [place], [address] Phone: [phone]   Fax:  [phone]  Occupational Therapy Treatment  Patient Details  Name: Chelsea [name] MRN: [id number] Date of Birth: [birth year] Referring Provider (OT): Star Age, MD   Encounter Date: 10/21/2020   OT End of Session - 10/21/20 1449    Visit Number 5    Number of Visits 17    Date for OT Re-Evaluation 12/01/20    Authorization Type UHC Medicare/Medicaid    Authorization Time Period No Auth Req covered 100%    Authorization - Visit Number 5    Authorization - Number of Visits 10    Progress Note Due on Visit 10    OT Start Time 1447    OT Stop Time 1530    OT Time Calculation (min) 43 min    Activity Tolerance Patient tolerated treatment well    Behavior During Therapy [facility] for tasks assessed/performed           Past Medical History:  Diagnosis Date  . Anxiety   . Cerebrovascular accident [facility])    a. 2007-->residual left sided weakness  . Chest pain   . Depression   . GERD (gastroesophageal reflux disease)   . Headache   . Hx of hysterectomy   . Hyperlipidemia   . Hypertension   . Mild to Moderate Aortic Insufficiency    a. 08/2014 Echo: EF 55-60%, mild conc LVH, no rwma, Gr 1 DD, mild-mod AI, mild MR.  . Morbid obesity ([place])   . Obesity   . Peptic ulcer disease     Past Surgical History:  Procedure Laterality Date  . ABDOMINAL HYSTERECTOMY    . TOTAL KNEE ARTHROPLASTY Left 01/05/2016   Procedure: TOTAL KNEE ARTHROPLASTY;  Surgeon: Frederik Pear, MD;  Location: [facility];  Service: Orthopedics;  Laterality: Left;  . TUBAL LIGATION      There were no vitals filed for this visit.   Subjective Assessment - 10/21/20 1449    Subjective  Pt denies any pain. In response to how PT was - "she was trying to kill me again"    Patient is accompanied by: --   none   Pertinent History PMH is significant for: h/o Rt CVA in 2007;  anxiety & depression, HTN, mild to moderate aortic insufficiency.    Limitations Fall Risk. High BP controlled with meds.    Patient Stated Goals play my games, want some feeling in my hand (LUE),    Currently in Pain? Yes    Pain Score 3     Pain Location Leg    Pain Orientation Left    Pain Descriptors / Indicators Aching    Pain Type Acute pain    Pain Onset More than a month ago    Pain Frequency Intermittent             Treatment:  Weight bearing in LUE while seated edge of mat and reaching across midline with RUE and lateral weight shifting and reaching to place resistance clothespins on antenna. 1-8# resistance clothespins with RUE  Standing tolerance and balance while placing resistance clothespins on poles. Good standing tolerance for activity.    Pt issued CMC thumb splint for LUE secondary to maintaining more thumb extension secondary to tone that facilitates increased thumb flexion. Pt educated on discharging brace if any redness, swelling or pain is present. Pt demonstrated donning and doffing.  Grasp/Release with cones with LUE with self support at wrist  and again without other hand supporting. Pt with good use of strategies for decreasing tremor.               OT Education - 10/21/20 1542    Education Details CMC splint for LUE - education on splint wear and care. Discharge if any redness, pain or swelling occurs.    Person(s) Educated Patient    Methods Explanation    Comprehension Verbalized understanding            OT Short Term Goals - 10/13/20 1449      OT SHORT TERM GOAL #1   Title Pt will be independent with HEP 11/03/20    Baseline none issued yet    Time 4    Period Weeks    Status On-going    Target Date 11/03/20      OT SHORT TERM GOAL #2   Title Pt will verbalize understanding of sensory strategies to increase safety for cooking and other ADLs and IADLs.    Baseline have not reviewed yet    Time 4    Period Weeks    Status  On-going      OT SHORT TERM GOAL #3   Title Pt will demontrate improved standing tolerance by completing activity in standing for 10 minutes or greater with pain less than or equal to 6/10    Baseline back pain 8/10 standing 5-10 minutes    Time 4    Period Weeks    Status New      OT SHORT TERM GOAL #4   Title Pt will report increased ease with manipulating fasteners and tying shoes with A/E prn.    Baseline increased time and difficulty    Time 4    Period Weeks    Status New      OT SHORT TERM GOAL #5   Title Pt will demonstrate improved functional use of LUE evidenced by Box and Blocks score of 16 or more.    Baseline 11    Time 4    Period Weeks    Status New             OT Long Term Goals - 10/06/20 1823      OT LONG TERM GOAL #1   Title Pt will be independent with updated HEP 12/01/2020    Baseline none    Time 8    Period Weeks    Status New    Target Date 12/01/20      OT LONG TERM GOAL #2   Title Pt will report functional use of LUE during basic ADLs and IADLs at least 50% of tasks requiring non dominant side.    Baseline 10-15%    Time 8    Period Weeks    Status New      OT LONG TERM GOAL #3   Title Pt will improve functional use of LUE as evidenced by performing 20 blocks on Box & Blocks test    Baseline 11    Time 8    Period Weeks    Status New      OT LONG TERM GOAL #4   Title Pt will demontrate improved activity tolerance by completing activity in standing for 15 minutes or greater with pain less than or equal to 5/10    Baseline 8/10 with standing 5-10 minutes    Time 8    Period Weeks    Status New      OT LONG TERM GOAL #5  Title Pt will complete a mod complex cooking task or home management task with mod I and good safety awareness with reports of increased ease and use of adapted strategies and/or equipment PRN    Baseline diff with sweeping and standing for cooking etc.    Time 8    Period Weeks    Status New                  Plan - 10/21/20 1546    Clinical Impression Statement Pt is progressing towards goals. Pt continues to be motivated for continued progress.    OT Occupational Profile and History Problem Focused Assessment - Including review of records relating to presenting problem    Occupational performance deficits (Please refer to evaluation for details): ADL's;IADL's;Leisure    Body Structure / Function / Physical Skills ADL;Decreased knowledge of use of DME;Strength;Tone;Pain;GMC;Dexterity;Balance;Proprioception;UE functional use;ROM;IADL;Sensation;Flexibility;Coordination;FMC    Cognitive Skills Problem Solve;Perception    Rehab Potential Fair    Clinical Decision Making Limited treatment options, no task modification necessary    Comorbidities Affecting Occupational Performance: May have comorbidities impacting occupational performance    Modification or Assistance to Complete Evaluation  No modification of tasks or assist necessary to complete eval    OT Frequency 2x / week    OT Duration 8 weeks    OT Treatment/Interventions Self-care/ADL training;DME and/or AE instruction;Balance training;Therapeutic Air traffic controller;Therapeutic exercise;Cognitive remediation/compensation;Passive range of motion;Functional Mobility Training;Neuromuscular education;Manual Therapy;Patient/family education;Energy conservation    Plan standing tolerance and balance    Consulted and Agree with Plan of Care Patient           Patient will benefit from skilled therapeutic intervention in order to improve the following deficits and impairments:   Body Structure / Function / Physical Skills: ADL, Decreased knowledge of use of DME, Strength, Tone, Pain, GMC, Dexterity, Balance, Proprioception, UE functional use, ROM, IADL, Sensation, Flexibility, Coordination, FMC Cognitive Skills: Problem Solve, Perception     Visit Diagnosis: Other abnormalities of gait and mobility  Muscle weakness  (generalized)  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 54 04/03/2020  . Nutritional counseling 01/19/2018  . Primary osteoarthritis of left knee 01/03/2016  . Mild to Moderate Aortic Insufficiency   . Hypertension   . Hyperlipidemia   . Morbid obesity ([place])   . Pulmonary hypertension ([place]) 01/30/2013  . Orthostatic hypotension 12/18/2012  . Aortic valve disorder 07/29/2010  . CHEST PAIN-PRECORDIAL 07/29/2010  . CYST OF THYROID 01/06/2010  . HYPOKALEMIA 01/06/2010  . HYPERLIPIDEMIA 12/24/2009  . OBESITY 12/24/2009  . HYPERTENSION, UNSPECIFIED 12/24/2009  . Personal history of unspecified circulatory disease 12/24/2009  . PEPTIC ULCER DISEASE, HX OF 12/24/2009    Zachery Conch MOT, OTR/L  10/21/2020, 3:46 PM  [facility] [address] [place], [place], [address] Phone: [phone]   Fax:  [phone]  Name: Chelsea [name] MRN: [id number] Date of Birth: 1951-06-04

## 2020-10-22 ENCOUNTER — Other Ambulatory Visit: Payer: Medicare Other | Admitting: *Deleted

## 2020-10-22 ENCOUNTER — Ambulatory Visit (INDEPENDENT_AMBULATORY_CARE_PROVIDER_SITE_OTHER): Payer: Medicare Other | Admitting: Pharmacist

## 2020-10-22 VITALS — BP 140/80 | HR 64

## 2020-10-22 DIAGNOSIS — E876 Hypokalemia: Secondary | ICD-10-CM

## 2020-10-22 DIAGNOSIS — I10 Essential (primary) hypertension: Secondary | ICD-10-CM

## 2020-10-22 NOTE — Therapy (Addendum)
[facility] [address] [place], [place], [address] Phone: [phone]   Fax:  [phone]  Physical Therapy Treatment/10th Visit Progress Note  Patient Details  Name: Chelsea [name] MRN: [id number] Date of Birth: Feb 05, 1951 Referring Provider (PT): Star Age, MD  10th Visit Physical Therapy Progress Note  Dates of Reporting Period: 09/10/20 to 10/21/20  Pt's STGs checked on 10/08/20. 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 Pt decr TUG time to 14.88 seconds with rollator with pt at a decr risk for falls and improvement of 5x sit <> stand to 16.69 seconds with no UE support. Pt to be casted for custom L AFO on 10/30/20 in order to improve gait mechanics and to decr fall risk. Pt will continue to benefit from skilled PT in order to work on gait training, balance, LLE strengthening in order to improve functional mobility and independence.    Encounter Date: 10/21/2020   PT End of Session - 10/21/20 1410    Visit Number 10    Number of Visits 17    Date for PT Re-Evaluation 12/10/20   written for 60 day POC   Authorization Type UHC Medicare & Medicaid    PT Start Time 1405    PT Stop Time 1445    PT Time Calculation (min) 40 min    Equipment Utilized During Treatment Gait belt    Activity Tolerance Patient tolerated treatment well    Behavior During Therapy WFL for tasks assessed/performed           Past Medical History:  Diagnosis Date  . Anxiety   . Cerebrovascular accident [facility])    a. 2007-->residual left sided weakness  . Chest pain   . Depression   . GERD (gastroesophageal reflux disease)   . Headache   . Hx of hysterectomy   . Hyperlipidemia   . Hypertension   . Mild to Moderate Aortic Insufficiency    a. 08/2014 Echo: EF 55-60%, mild conc LVH, no rwma, Gr 1 DD, mild-mod AI, mild MR.  . Morbid obesity ([place])   .  Obesity   . Peptic ulcer disease     Past Surgical History:  Procedure Laterality Date  . ABDOMINAL HYSTERECTOMY    . TOTAL KNEE ARTHROPLASTY Left 01/05/2016   Procedure: TOTAL KNEE ARTHROPLASTY;  Surgeon: Frederik Pear, MD;  Location: [facility];  Service: Orthopedics;  Laterality: Left;  . TUBAL LIGATION      There were no vitals filed for this visit.   Subjective Assessment - 10/21/20 1408    Subjective No new complaints. No falls or pain to report. Has appt on Dec 2cd at 12pm with [facility] to be casted for her brace.    Pertinent History Rt CVA in 2007 with Lt hemiparesis;  anxiety, depression, mild to moderate aortic insufficiency,  arthritis with s/p L TKA in 2/16, thalamic pain syndrome    Limitations Walking;House hold activities    How long can you sit comfortably? No limitation    How long can you stand comfortably? No limitation    How long can you walk comfortably? No limitation    Patient Stated Goals wants to get strength back in arms and legs.    Currently in Pain? No/denies    Pain Score 0-No pain                 [facility] Adult PT Treatment/Exercise - 10/21/20 1413  Transfers   Transfers Sit to Stand;Stand to Sit    Sit to Stand 5: Supervision    Stand to Sit 5: Supervision      Ambulation/Gait   Ambulation/Gait Yes    Ambulation/Gait Assistance 5: Supervision    Ambulation/Gait Assistance Details around gym with session with cues to stay closer to rollator with gait and for increased step length.     Assistive device Rollator    Gait Pattern Step-through pattern;Decreased step length - right;Decreased stance time - left;Decreased hip/knee flexion - left;Decreased dorsiflexion - left;Decreased weight shift to left;Left genu recurvatum;Poor foot clearance - left;Left foot flat    Ambulation Surface Level;Indoor      High Level Balance   High Level Balance Activities Side stepping;Tandem walking;Marching forwards;Marching backwards   tandem fwd/bwd   High Level  Balance Comments on red mat in parallel bars: 4 laps each/each way with bil light UE support on bars. cues on form and technique.      Neuro Re-ed    Neuro Re-ed Details  for balance/strengthening: on airex with no UE support feet hip width apart for EC 30 sec's x 3 reps with up to min assist for balance; then with feet in staggered wide staggered stance for EC 20 sec's x 3 reps each foot forward. min guard to min assist for balance. occasional touch to sturdy surface for balance assistance.      Knee/Hip Exercises: Seated   Long Arc Quad AROM;Strengthening;Left;2 sets;10 reps;Limitations    Long [employer] Limitations cues for slow and controlled movements with red band resistance    Heel Slides AROM;Strengthening;Left;2 sets;10 reps;Limitations    Heel Slides Limitations cues for slow and controlled movements with red band reistance.     Other Seated Knee/Hip Exercises with green band around both knees: left hip fall out for 2 sets of 10 reps. cues on form/technique with assist to keep other LE stable.                      PT Short Term Goals - 10/08/20 1410      PT SHORT TERM GOAL #1   Title Pt will undergo further assessment of BERG -LTG written as appropriate. ALL STGS DUE 10/09/20    Baseline 29/56    Status Achieved    Target Date 10/09/20      PT SHORT TERM GOAL #2   Title Pt will undergo further assessment of AFO for improved gait mechanics.    Baseline visit with orthotist on 10/03/20    Status Achieved      PT SHORT TERM GOAL #3   Title Pt will improve gait sped to at least 1.8 ft/sec with small base quad in order to decr fall risk.    Baseline 10/08/20: 2.22 ft/sec wtih rollator    Time --    Period --    Status Achieved      PT SHORT TERM GOAL #4   Title Pt will decr TUG to 17 seconds or less with small based quad cane in order to demo decr fall risk.    Baseline 10/08/20: 14.88 sec's with rollator    Time --    Period --    Status Achieved      PT SHORT  TERM GOAL #5   Title Pt will improve 5x sit <> stand time with no UE support in 17 seconds or less in order to demo improved BLE strength.    Baseline 10/08/20: 16.69 sec's no  UE support from standard height surface    Time --    Period --    Status Achieved             PT Long Term Goals - 10/06/20 1652      PT LONG TERM GOAL #1   Title Pt will be independent with final HEP in order to build upon functional gains made in therapy. ALL LTGS DUE 11/06/20    Time 8    Period Weeks    Status New      PT LONG TERM GOAL #2   Title Pt will improve BERG to at least a 36/56 in order to demo decr fall risk.    Baseline 29/56    Time 8    Period Weeks    Status Revised      PT LONG TERM GOAL #3   Title Pt will improve gait sped to at least 2.2 ft/sec with small base quad in order to improve gait efficiency.    Baseline 1.58 ft/sec    Time 8    Period Weeks    Status New      PT LONG TERM GOAL #4   Title Pt will decr TUG to 15 seconds or less with small based quad cane in order to demo decr fall risk.    Baseline 19.09 seconds    Time 8    Period Weeks    Status New      PT LONG TERM GOAL #5   Title Pt will ambulate at least 300' over indoor and outdoor unlevel surfaces with supervision with small base quad cane in order to improve functional mobility.    Time 8    Period Weeks    Status New                 Plan - 10/21/20 1410    Clinical Impression Statement Today's skilled session continued to focus on strengthening and balance training. No issues noted or reported in session. The pt is progressing toward goals and should benefit from continued PT to progress toward unmet goals.    Personal Factors and Comorbidities Time since onset of injury/illness/exacerbation;Past/Current Experience;Fitness    Comorbidities h/o Rt CVA in 2007; anxiety & depression, HTN, mild to moderate aortic insufficiency    Examination-Activity Limitations Squat;Transfers;Stand;Locomotion  Level    Examination-Participation Restrictions Estate agent;Shop    Stability/Clinical Decision Making Stable/Uncomplicated    Rehab Potential Good    PT Frequency 2x / week    PT Duration 8 weeks    PT Treatment/Interventions ADLs/Self Care Home Management;Therapeutic exercise;Therapeutic activities;Patient/family education;Passive range of motion;DME Instruction;Gait training;Functional mobility training;Stair training;Balance training;Neuromuscular re-education;Orthotic Fit/Training;Vestibular;Energy conservation    PT Next Visit Plan balance strategies on compliant surfaces, standing balance with UE support (add to HEP as needed), continue to work on LE strengthening. ? 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 54 04/03/2020  . Nutritional counseling 01/19/2018  . Primary osteoarthritis of left knee 01/03/2016  . Mild to Moderate Aortic Insufficiency   . Hypertension   . Hyperlipidemia   . Morbid obesity ([place])   . Pulmonary hypertension ([place]) 01/30/2013  . Orthostatic hypotension 12/18/2012  . Aortic valve disorder 07/29/2010  . CHEST PAIN-PRECORDIAL 07/29/2010  . CYST OF THYROID 01/06/2010  . HYPOKALEMIA 01/06/2010  . HYPERLIPIDEMIA 12/24/2009  . OBESITY 12/24/2009  .  HYPERTENSION, UNSPECIFIED 12/24/2009  . Personal history of unspecified circulatory disease 12/24/2009  . PEPTIC ULCER DISEASE, HX OF 12/24/2009    Willow Ora, PTA, [facility] [address] [place], [place] [address] [phone] 10/22/20, 9:55 PM   Name: Arella Blinder MRN: [id number] Date of Birth: [birth year]    Janann August, PT, DPT 10/29/20 9:11 AM

## 2020-10-22 NOTE — Progress Notes (Signed)
Patient ID: Chelsea [name]                 DOB: [birth year]                      MRN: [id number]     HPI: Chelsea [name] is a 69 y.o. female referred by Dr. Tamala Julian to HTN clinic.PMH is significant for mild aortic regurgitation, non-cardiac chest pain, hx stroke and residual left-sided weakness, HLD, HTN, and moderate obesity.    Patient last seen in pharmacy clinic 10/07/20. She reported medication adherence with all HTN and HF medications except for doxasozin because she ran out 1 month ago. Also reported completing clonidine taper. At that visit, spironolactone was to be increased to 50 mg daily. However, given bump in [place], patient was instructed to continue spironolactone 25 mg daily and restart doxazosin 1 mg at bedtime.   Patient presents today in good spirits.  Forgot to bring medication bottles.  Reports a morning blood pressure of 120/80 with pulse of 87. but did not bring cuff or log to verify.  Reports medication compliance.  Of note, patient ambulates with a walker and is walking much slower because she says she hurt her foot at physical therapy. Is in pain and has been using icy hot which she says is effective.   Reports she is still wearing her nicotine patch and has cut down to one cigarette a day.  BMP pending.  Current HTN meds: amlodipine/valsartan (Exforge) 10/320 mg daily (AM), metoprolol succinate100mg  daily (PM), doxazosin 1 mgQHS,indapamide 2.5 mg daily (AM), Spironolactone 25mg  daily (AM) Previously tried:lisinopril 10 mg daily (low BP), bumetanide 1 mg daily  BP goal: <130/80  Wt Readings from Last 3 Encounters:  08/11/20 204 lb (92.5 kg)  08/08/20 205 lb 6.4 oz (93.2 kg)  10/16/19 202 lb 6.4 oz (91.8 kg)   BP Readings from Last 3 Encounters:  10/13/20 118/80  10/10/20 134/76  10/08/20 127/88   Pulse Readings from Last 3 Encounters:  10/13/20 (!) 57  10/07/20 62  10/06/20 64    Renal function: CrCl cannot be calculated (Unknown ideal  weight.).  Past Medical History:  Diagnosis Date   Anxiety    Cerebrovascular accident [facility])    a. 2007-->residual left sided weakness   Chest pain    Depression    GERD (gastroesophageal reflux disease)    Headache    Hx of hysterectomy    Hyperlipidemia    Hypertension    Mild to Moderate Aortic Insufficiency    a. 08/2014 Echo: EF 55-60%, mild conc LVH, no rwma, Gr 1 DD, mild-mod AI, mild MR.   Morbid obesity ([place])    Obesity    Peptic ulcer disease     Current Outpatient Medications on File Prior to Visit  Medication Sig Dispense Refill   amLODipine-valsartan (EXFORGE) 10-320 MG tablet Take 1 tablet by mouth daily.     aspirin EC 81 MG tablet Take 1 tablet (81 mg total) by mouth daily.     atorvastatin (LIPITOR) 10 MG tablet Take 1 tablet (10 mg total) by mouth daily. 90 tablet 3   chlorhexidine (PERIDEX) 0.12 % solution 15 mLs 2 (two) times daily.     citalopram (CELEXA) 10 MG tablet Take 20 mg by mouth daily.      cyanocobalamin 100 MCG tablet Take 100 mcg by mouth daily.      cyclobenzaprine (FLEXERIL) 10 MG tablet Take 10 mg by mouth 3 (three) times daily  as needed for muscle spasms.     doxazosin (CARDURA) 1 MG tablet Take 1 tablet (1 mg total) by mouth daily. 30 tablet 11   ezetimibe (ZETIA) 10 MG tablet Take 10 mg by mouth daily.     furosemide (LASIX) 20 MG tablet Take 20 mg by mouth as needed. (Patient not taking: Reported on 09/23/2020)     gabapentin (NEURONTIN) 800 MG tablet Take 1 tablet (800 mg total) by mouth 2 (two) times daily. 180 tablet 3   indapamide (LOZOL) 2.5 MG tablet Take 1 tablet (2.5 mg total) by mouth daily. 30 tablet 11   meclizine (ANTIVERT) 25 MG tablet Take 25 mg by mouth 3 (three) times daily as needed.     methocarbamol (ROBAXIN) 500 MG tablet Take 500-1,000 mg by mouth 3 (three) times daily as needed.     metoprolol succinate (TOPROL-XL) 100 MG 24 hr tablet Take 1 tablet (100 mg total) by mouth daily. Take with or  immediately following a meal. 30 tablet 11   mirabegron ER (MYRBETRIQ) 25 MG TB24 tablet Take 25 mg by mouth daily.     Multiple Vitamin (MULTIVITAMIN) tablet Take 1 tablet by mouth daily.       nicotine (NICODERM CQ - DOSED IN MG/24 HOURS) 21 mg/24hr patch Place 1 patch (21 mg total) onto the skin daily. 28 patch 2   nitroGLYCERIN (NITROSTAT) 0.4 MG SL tablet Place 1 tablet (0.4 mg total) under the tongue every 5 (five) minutes as needed for chest pain. 25 tablet 3   pantoprazole (PROTONIX) 40 MG tablet Take 40 mg by mouth 2 (two) times daily.      spironolactone (ALDACTONE) 25 MG tablet Take 1 tablet (25 mg total) by mouth daily. 30 tablet 11   tiZANidine (ZANAFLEX) 4 MG tablet Take 8 mg by mouth 3 (three) times daily as needed. (Patient not taking: Reported on 09/23/2020)     traMADol (ULTRAM) 50 MG tablet Take 50 mg by mouth every 8 (eight) hours as needed.     No current facility-administered medications on file prior to visit.    Allergies  Allergen Reactions   Other Swelling    Hair dye, caused eye swelling, multiple times experienced this reaction   Hydrocodone-Acetaminophen Nausea Only   Oxycodone Nausea And Vomiting     Assessment/Plan:  1. Hypertension - Patient BP in room today 140/80 which is above goal of <130/80.  BP had recently been trending down to goal, possibly elevated today due to pain and struggling to get down hall since patient reports reducing tobacco use and reports medication compliance.  Pending results of BMP, consider increasing doxazosin or retrying spironolactone increase.  Patient does not want to schedule recheck at the moment due to numerous PT appointments throughout December.  Will continue medications as scheduled and contact patient next week.  Continue amlodipine/valsartan 10/320 daily Continue doxazosin 1 mg daily Continue indapamide 2.5mg  daily Continue metoprolol succinate 100mg  daily Continue spironolactone 25 mg daily  Karren Cobble, PharmD, BCACP, [place], [place] [address] [place], [place] [address] Phone: [phone]; Fax: [phone] 10/22/2020 5:02 PM

## 2020-10-22 NOTE — Patient Instructions (Addendum)
It was good seeing you today!  We would like your blood pressure to be less than 130/80  Please continue your amlodipine/valsartan once a day, your metoprolol once a day, your doxazosin once a day, your indapamide once a day, and your spironolactone once a day  We will contact you with your blood work results and then decide on whether we should increase your spironolactone  Please call with any questions  Karren Cobble, PharmD, [place], [place] [address], [place], [place] [address] Phone: [phone]; Fax: [phone] 10/22/2020 2:55 PM

## 2020-10-23 LAB — BASIC METABOLIC PANEL
BUN/Creatinine Ratio: 11 — ABNORMAL LOW (ref 12–28)
BUN: 16 mg/dL (ref 8–27)
CO2: 27 mmol/L (ref 20–29)
Calcium: 9.6 mg/dL (ref 8.7–10.3)
Chloride: 103 mmol/L (ref 96–106)
Creatinine, Ser: 1.47 mg/dL — ABNORMAL HIGH (ref 0.57–1.00)
GFR calc Af Amer: 42 mL/min/{1.73_m2} — ABNORMAL LOW (ref >59)
GFR calc non Af Amer: 36 mL/min/{1.73_m2} — ABNORMAL LOW (ref >59)
Glucose: 90 mg/dL (ref 65–99)
Potassium: 4 mmol/L (ref 3.5–5.2)
Sodium: 144 mmol/L (ref 134–144)

## 2020-10-27 ENCOUNTER — Encounter: Payer: Self-pay | Admitting: Occupational Therapy

## 2020-10-27 ENCOUNTER — Ambulatory Visit: Payer: Medicare Other | Admitting: Physical Therapy

## 2020-10-27 ENCOUNTER — Ambulatory Visit: Payer: Medicare Other | Admitting: Occupational Therapy

## 2020-10-27 ENCOUNTER — Other Ambulatory Visit: Payer: Self-pay

## 2020-10-27 DIAGNOSIS — M6281 Muscle weakness (generalized): Secondary | ICD-10-CM | POA: Diagnosis not present

## 2020-10-27 DIAGNOSIS — R2689 Other abnormalities of gait and mobility: Secondary | ICD-10-CM

## 2020-10-27 DIAGNOSIS — I69854 Hemiplegia and hemiparesis following other cerebrovascular disease affecting left non-dominant side: Secondary | ICD-10-CM

## 2020-10-27 DIAGNOSIS — R278 Other lack of coordination: Secondary | ICD-10-CM | POA: Diagnosis not present

## 2020-10-27 DIAGNOSIS — I69354 Hemiplegia and hemiparesis following cerebral infarction affecting left non-dominant side: Secondary | ICD-10-CM | POA: Diagnosis not present

## 2020-10-27 DIAGNOSIS — R2681 Unsteadiness on feet: Secondary | ICD-10-CM | POA: Diagnosis not present

## 2020-10-27 NOTE — Therapy (Signed)
[facility] [address] [place], [place], [address] Phone: [phone]   Fax:  [phone]  Physical Therapy Treatment  Patient Details  Name: Chelsea [name] MRN: [id number] Date of Birth: March 24, 1951 Referring Provider (PT): Star Age, MD   Encounter Date: 10/27/2020   PT End of Session - 10/27/20 1547    Visit Number 11    Number of Visits 17    Date for PT Re-Evaluation 12/10/20   written for 60 day POC   Authorization Type UHC Medicare & Medicaid    PT Start Time 1401    PT Stop Time 1443    PT Time Calculation (min) 42 min    Activity Tolerance Patient tolerated treatment well    Behavior During Therapy [facility] for tasks assessed/performed           Past Medical History:  Diagnosis Date  . Anxiety   . Cerebrovascular accident [facility])    a. 2007-->residual left sided weakness  . Chest pain   . Depression   . GERD (gastroesophageal reflux disease)   . Headache   . Hx of hysterectomy   . Hyperlipidemia   . Hypertension   . Mild to Moderate Aortic Insufficiency    a. 08/2014 Echo: EF 55-60%, mild conc LVH, no rwma, Gr 1 DD, mild-mod AI, mild MR.  . Morbid obesity ([place])   . Obesity   . Peptic ulcer disease     Past Surgical History:  Procedure Laterality Date  . ABDOMINAL HYSTERECTOMY    . TOTAL KNEE ARTHROPLASTY Left 01/05/2016   Procedure: TOTAL KNEE ARTHROPLASTY;  Surgeon: Frederik Pear, MD;  Location: [place];  Service: Orthopedics;  Laterality: Left;  . TUBAL LIGATION      There were no vitals filed for this visit.   Subjective Assessment - 10/27/20 1406    Subjective L ankle felt sore after last time. Put some ice on it and is now feeling better. No falls.    Pertinent History Rt CVA in 2007 with Lt hemiparesis;  anxiety, depression, mild to moderate aortic insufficiency,  arthritis with s/p L TKA in 2/16, thalamic pain syndrome    Limitations Walking;House hold activities    How long can you  sit comfortably? No limitation    How long can you stand comfortably? No limitation    How long can you walk comfortably? No limitation    Patient Stated Goals wants to get strength back in arms and legs.    Currently in Pain? No/denies                             [facility] Adult PT Treatment/Exercise - 10/27/20 0001      Knee/Hip Exercises: Seated   Long Arc Quad AROM;Strengthening;Left;2 sets;10 reps;Limitations    Long Arc Quad Limitations with red tband x10 reps and then green tband x10 reps, cues for slowed and controlled and for full ROM    Heel Slides AROM;Strengthening;Left;2 sets;10 reps;Limitations    Heel Slides Limitations with red tband, use of pillow under heel to decr friction      Knee/Hip Exercises: Supine   Bridges Strengthening;AROM    Bridges Limitations with ball squeeze x10 reps, cues for full ROM, esp on L   Straight Leg Raises Strengthening;AROM;Left;2 sets;5 reps    Straight Leg Raises Limitations verbal and tactile cues for proper technique     Other Supine Knee/Hip Exercises hooklying hip/knee flexion x5 reps with  red theraband and progressing to x10 reps with green tband - cues for slowed and controlled lowering back down to mat.      Knee/Hip Exercises: Sidelying   Hip ABduction Limitations Attempted sidelying straight leg hip ABD  on L (trying to lift LLE off bolster) with pt compensting with incr L external rotation despite verbal and manual cues    Clams R sidelying: x10 reps LLE clamshell, x10 reps LLE with red theraband, verbal and tactile cues for proper technique                  PT Education - 10/27/20 1546    Education Details going on hold after pt is casted for L AFO and will resume therapy once pt gets her AFO to continue gait/balance/strength training, pt in agreement    Person(s) Educated Patient    Methods Explanation    Comprehension Verbalized understanding            PT Short Term Goals - 10/08/20 1410       PT SHORT TERM GOAL #1   Title Pt will undergo further assessment of BERG -LTG written as appropriate. ALL STGS DUE 10/09/20    Baseline 29/56    Status Achieved    Target Date 10/09/20      PT SHORT TERM GOAL #2   Title Pt will undergo further assessment of AFO for improved gait mechanics.    Baseline visit with orthotist on 10/03/20    Status Achieved      PT SHORT TERM GOAL #3   Title Pt will improve gait sped to at least 1.8 ft/sec with small base quad in order to decr fall risk.    Baseline 10/08/20: 2.22 ft/sec wtih rollator    Time --    Period --    Status Achieved      PT SHORT TERM GOAL #4   Title Pt will decr TUG to 17 seconds or less with small based quad cane in order to demo decr fall risk.    Baseline 10/08/20: 14.88 sec's with rollator    Time --    Period --    Status Achieved      PT SHORT TERM GOAL #5   Title Pt will improve 5x sit <> stand time with no UE support in 17 seconds or less in order to demo improved BLE strength.    Baseline 10/08/20: 16.69 sec's no UE support from standard height surface    Time --    Period --    Status Achieved             PT Long Term Goals - 10/06/20 1652      PT LONG TERM GOAL #1   Title Pt will be independent with final HEP in order to build upon functional gains made in therapy. ALL LTGS DUE 11/06/20    Time 8    Period Weeks    Status New      PT LONG TERM GOAL #2   Title Pt will improve BERG to at least a 36/56 in order to demo decr fall risk.    Baseline 29/56    Time 8    Period Weeks    Status Revised      PT LONG TERM GOAL #3   Title Pt will improve gait sped to at least 2.2 ft/sec with small base quad in order to improve gait efficiency.    Baseline 1.58 ft/sec    Time 8  Period Weeks    Status New      PT LONG TERM GOAL #4   Title Pt will decr TUG to 15 seconds or less with small based quad cane in order to demo decr fall risk.    Baseline 19.09 seconds    Time 8    Period Weeks    Status  New      PT LONG TERM GOAL #5   Title Pt will ambulate at least 300' over indoor and outdoor unlevel surfaces with supervision with small base quad cane in order to improve functional mobility.    Time 8    Period Weeks    Status New                 Plan - 10/27/20 1547    Clinical Impression Statement Today's skilled session focused on L>RLE strengthening. Pt tolerated session well, needing intermittent rest breaks throughout. Pt in agreement to go on hold with PT after getting casted for AFO. Will continue to progress towards LTGs.    Personal Factors and Comorbidities Time since onset of injury/illness/exacerbation;Past/Current Experience;Fitness    Comorbidities h/o Rt CVA in 2007; anxiety & depression, HTN, mild to moderate aortic insufficiency    Examination-Activity Limitations Squat;Transfers;Stand;Locomotion Level    Examination-Participation Restrictions Estate agent;Shop    Stability/Clinical Decision Making Stable/Uncomplicated    Rehab Potential Good    PT Frequency 2x / week    PT Duration 8 weeks    PT Treatment/Interventions ADLs/Self Care Home Management;Therapeutic exercise;Therapeutic activities;Patient/family education;Passive range of motion;DME Instruction;Gait training;Functional mobility training;Stair training;Balance training;Neuromuscular re-education;Orthotic Fit/Training;Vestibular;Energy conservation    PT Next Visit Plan goals due next week. balance strategies on compliant surfaces, standing balance with UE support (add to HEP as needed), continue to work on LE strengthening.    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 54 04/03/2020  . Nutritional counseling 01/19/2018  . Primary osteoarthritis of left knee 01/03/2016  . Mild to Moderate Aortic Insufficiency   . Hypertension   . Hyperlipidemia   . Morbid obesity ([place])   . Pulmonary hypertension ([place]) 01/30/2013  . Orthostatic hypotension 12/18/2012  . Aortic valve disorder 07/29/2010  . CHEST PAIN-PRECORDIAL 07/29/2010  . CYST OF THYROID 01/06/2010  . HYPOKALEMIA 01/06/2010  . HYPERLIPIDEMIA 12/24/2009  . OBESITY 12/24/2009  . HYPERTENSION, UNSPECIFIED 12/24/2009  . Personal history of unspecified circulatory disease 12/24/2009  . PEPTIC ULCER DISEASE, HX OF 12/24/2009    Arliss Journey, PT ,DPT  10/27/2020, 3:49 PM  [facility] [name] [place], [place], [address] Phone: [phone]   Fax:  [phone]  Name: Chelsea [name] MRN: [id number] Date of Birth: Jun 18, 1951

## 2020-10-27 NOTE — Therapy (Signed)
[facility] [address] [place], [place], [address] Phone: [phone]   Fax:  [phone]  Occupational Therapy Treatment  Patient Details  Name: Chelsea [name] MRN: [id number] Date of Birth: 04-Nov-1951 Referring Provider (OT): Star Age, MD   Encounter Date: 10/27/2020   OT End of Session - 10/27/20 1321    Visit Number 6    Number of Visits 17    Date for OT Re-Evaluation 12/01/20    Authorization Type UHC Medicare/Medicaid    Authorization Time Period No Auth Req covered 100%    Authorization - Visit Number 6    Authorization - Number of Visits 10    Progress Note Due on Visit 10    OT Start Time 1319    OT Stop Time 1400    OT Time Calculation (min) 41 min    Activity Tolerance Patient tolerated treatment well    Behavior During Therapy [facility] for tasks assessed/performed           Past Medical History:  Diagnosis Date  . Anxiety   . Cerebrovascular accident [facility])    a. 2007-->residual left sided weakness  . Chest pain   . Depression   . GERD (gastroesophageal reflux disease)   . Headache   . Hx of hysterectomy   . Hyperlipidemia   . Hypertension   . Mild to Moderate Aortic Insufficiency    a. 08/2014 Echo: EF 55-60%, mild conc LVH, no rwma, Gr 1 DD, mild-mod AI, mild MR.  . Morbid obesity ([place])   . Obesity   . Peptic ulcer disease     Past Surgical History:  Procedure Laterality Date  . ABDOMINAL HYSTERECTOMY    . TOTAL KNEE ARTHROPLASTY Left 01/05/2016   Procedure: TOTAL KNEE ARTHROPLASTY;  Surgeon: Frederik Pear, MD;  Location: [place];  Service: Orthopedics;  Laterality: Left;  . TUBAL LIGATION      There were no vitals filed for this visit.   Subjective Assessment - 10/27/20 1324    Subjective  Pt reports her microwave went out this weekend.    Pertinent History PMH is significant for: h/o Rt CVA in 2007; anxiety & depression, HTN, mild to moderate aortic insufficiency.     Limitations Fall Risk. High BP controlled with meds.    Patient Stated Goals play my games, want some feeling in my hand (LUE),    Currently in Pain? Yes    Pain Score 3     Pain Location Foot    Pain Orientation Left    Pain Descriptors / Indicators Aching    Pain Type Acute pain    Pain Onset More than a month ago    Pain Frequency Intermittent            Treatment:  Pt reports splint is working well on LUE for thumb.  Standing tolerance and balance with standing while placing and removing rubber washers with RUE  And taking a lap around the clinic. Pt reported needing a seated rest break after 9 minutes.   Reviewed elastic shoe laces for shoes. Pt verbalized understanding.   Practiced simulated tying of shoes with yellow theraband.                OT Education - 10/27/20 1358    Education Details education on elastic shoelaces. provided handout and GDMS phone number    Person(s) Educated Patient    Methods Explanation;Handout    Comprehension Verbalized understanding  OT Short Term Goals - 10/27/20 1329      OT SHORT TERM GOAL #1   Title Pt will be independent with HEP 11/03/20    Baseline none issued yet    Time 4    Period Weeks    Status On-going    Target Date 11/03/20      OT SHORT TERM GOAL #2   Title Pt will verbalize understanding of sensory strategies to increase safety for cooking and other ADLs and IADLs.    Baseline have not reviewed yet    Time 4    Period Weeks    Status Achieved      OT SHORT TERM GOAL #3   Title Pt will demontrate improved standing tolerance by completing activity in standing for 10 minutes or greater with pain less than or equal to 6/10    Baseline back pain 8/10 standing 5-10 minutes    Time 4    Period Weeks    Status On-going   9 minutes pain 4/10     OT SHORT TERM GOAL #4   Title Pt will report increased ease with manipulating fasteners and tying shoes with A/E prn.    Baseline increased time  and difficulty    Time 4    Period Weeks    Status Achieved      OT SHORT TERM GOAL #5   Title Pt will demonstrate improved functional use of LUE evidenced by Box and Blocks score of 16 or more.    Baseline 11    Time 4    Period Weeks    Status New             OT Long Term Goals - 10/06/20 1823      OT LONG TERM GOAL #1   Title Pt will be independent with updated HEP 12/01/2020    Baseline none    Time 8    Period Weeks    Status New    Target Date 12/01/20      OT LONG TERM GOAL #2   Title Pt will report functional use of LUE during basic ADLs and IADLs at least 50% of tasks requiring non dominant side.    Baseline 10-15%    Time 8    Period Weeks    Status New      OT LONG TERM GOAL #3   Title Pt will improve functional use of LUE as evidenced by performing 20 blocks on Box & Blocks test    Baseline 11    Time 8    Period Weeks    Status New      OT LONG TERM GOAL #4   Title Pt will demontrate improved activity tolerance by completing activity in standing for 15 minutes or greater with pain less than or equal to 5/10    Baseline 8/10 with standing 5-10 minutes    Time 8    Period Weeks    Status New      OT LONG TERM GOAL #5   Title Pt will complete a mod complex cooking task or home management task with mod I and good safety awareness with reports of increased ease and use of adapted strategies and/or equipment PRN    Baseline diff with sweeping and standing for cooking etc.    Time 8    Period Weeks    Status New                 Plan - 10/27/20  1332    Clinical Impression Statement Pt is progressing towards goals. Pt has verbalized understanding of sensory strategies and increased ease with use of LUE.    OT Occupational Profile and History Problem Focused Assessment - Including review of records relating to presenting problem    Occupational performance deficits (Please refer to evaluation for details): ADL's;IADL's;Leisure    Body Structure /  Function / Physical Skills ADL;Decreased knowledge of use of DME;Strength;Tone;Pain;GMC;Dexterity;Balance;Proprioception;UE functional use;ROM;IADL;Sensation;Flexibility;Coordination;FMC    Cognitive Skills Problem Solve;Perception    Rehab Potential Fair    Clinical Decision Making Limited treatment options, no task modification necessary    Comorbidities Affecting Occupational Performance: May have comorbidities impacting occupational performance    Modification or Assistance to Complete Evaluation  No modification of tasks or assist necessary to complete eval    OT Frequency 2x / week    OT Duration 8 weeks    OT Treatment/Interventions Self-care/ADL training;DME and/or AE instruction;Balance training;Therapeutic Air traffic controller;Therapeutic exercise;Cognitive remediation/compensation;Passive range of motion;Functional Mobility Training;Neuromuscular education;Manual Therapy;Patient/family education;Energy conservation    Plan standing tolerance    Consulted and Agree with Plan of Care Patient           Patient will benefit from skilled therapeutic intervention in order to improve the following deficits and impairments:   Body Structure / Function / Physical Skills: ADL, Decreased knowledge of use of DME, Strength, Tone, Pain, GMC, Dexterity, Balance, Proprioception, UE functional use, ROM, IADL, Sensation, Flexibility, Coordination, FMC Cognitive Skills: Problem Solve, Perception     Visit Diagnosis: Muscle weakness (generalized)  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 54 04/03/2020  . Nutritional counseling 01/19/2018  . Primary osteoarthritis of left knee 01/03/2016  . Mild to Moderate Aortic  Insufficiency   . Hypertension   . Hyperlipidemia   . Morbid obesity ([place])   . Pulmonary hypertension ([place]) 01/30/2013  . Orthostatic hypotension 12/18/2012  . Aortic valve disorder 07/29/2010  . CHEST PAIN-PRECORDIAL 07/29/2010  . CYST OF THYROID 01/06/2010  . HYPOKALEMIA 01/06/2010  . HYPERLIPIDEMIA 12/24/2009  . OBESITY 12/24/2009  . HYPERTENSION, UNSPECIFIED 12/24/2009  . Personal history of unspecified circulatory disease 12/24/2009  . PEPTIC ULCER DISEASE, HX OF 12/24/2009    Zachery Conch MOT, OTR/L  10/27/2020, 2:11 PM  [facility] [address] [place], [place], [address] Phone: [phone]   Fax:  [phone]  Name: Nakyiah Kuck MRN: [id number] Date of Birth: Jan 25, 1951

## 2020-10-28 ENCOUNTER — Ambulatory Visit: Payer: Medicare Other

## 2020-10-28 DIAGNOSIS — M4316 Spondylolisthesis, lumbar region: Secondary | ICD-10-CM | POA: Diagnosis not present

## 2020-10-29 ENCOUNTER — Other Ambulatory Visit: Payer: Self-pay

## 2020-10-29 ENCOUNTER — Encounter: Payer: Self-pay | Admitting: Physical Therapy

## 2020-10-29 ENCOUNTER — Ambulatory Visit: Payer: Medicare Other | Admitting: Occupational Therapy

## 2020-10-29 ENCOUNTER — Ambulatory Visit: Payer: Medicare Other | Attending: Family Medicine | Admitting: Physical Therapy

## 2020-10-29 ENCOUNTER — Encounter: Payer: Self-pay | Admitting: Occupational Therapy

## 2020-10-29 VITALS — BP 132/86

## 2020-10-29 DIAGNOSIS — I69854 Hemiplegia and hemiparesis following other cerebrovascular disease affecting left non-dominant side: Secondary | ICD-10-CM | POA: Diagnosis not present

## 2020-10-29 DIAGNOSIS — R278 Other lack of coordination: Secondary | ICD-10-CM | POA: Diagnosis not present

## 2020-10-29 DIAGNOSIS — R2689 Other abnormalities of gait and mobility: Secondary | ICD-10-CM

## 2020-10-29 DIAGNOSIS — M6281 Muscle weakness (generalized): Secondary | ICD-10-CM | POA: Diagnosis not present

## 2020-10-29 DIAGNOSIS — I69354 Hemiplegia and hemiparesis following cerebral infarction affecting left non-dominant side: Secondary | ICD-10-CM

## 2020-10-29 DIAGNOSIS — R2681 Unsteadiness on feet: Secondary | ICD-10-CM | POA: Diagnosis not present

## 2020-10-29 DIAGNOSIS — R41840 Attention and concentration deficit: Secondary | ICD-10-CM | POA: Insufficient documentation

## 2020-10-29 NOTE — Therapy (Signed)
[facility] [address] [place], [place], [address] Phone: [phone]   Fax:  [phone]  Occupational Therapy Treatment  Patient Details  Name: Chelsea [name] MRN: [id number] Date of Birth: Feb 06, 1951 Referring Provider (OT): Star Age, MD   Encounter Date: 10/29/2020   OT End of Session - 10/29/20 1536    Visit Number 7    Number of Visits 17    Date for OT Re-Evaluation 12/01/20    Authorization Type UHC Medicare/Medicaid    Authorization Time Period No Auth Req covered 100%    Authorization - Visit Number 7    Authorization - Number of Visits 10    Progress Note Due on Visit 10    OT Start Time 1534    OT Stop Time 1615    OT Time Calculation (min) 41 min    Activity Tolerance Patient tolerated treatment well    Behavior During Therapy [facility] for tasks assessed/performed           Past Medical History:  Diagnosis Date  . Anxiety   . Cerebrovascular accident [facility])    a. 2007-->residual left sided weakness  . Chest pain   . Depression   . GERD (gastroesophageal reflux disease)   . Headache   . Hx of hysterectomy   . Hyperlipidemia   . Hypertension   . Mild to Moderate Aortic Insufficiency    a. 08/2014 Echo: EF 55-60%, mild conc LVH, no rwma, Gr 1 DD, mild-mod AI, mild MR.  . Morbid obesity ([place])   . Obesity   . Peptic ulcer disease     Past Surgical History:  Procedure Laterality Date  . ABDOMINAL HYSTERECTOMY    . TOTAL KNEE ARTHROPLASTY Left 01/05/2016   Procedure: TOTAL KNEE ARTHROPLASTY;  Surgeon: Frederik Pear, MD;  Location: [place];  Service: Orthopedics;  Laterality: Left;  . TUBAL LIGATION      There were no vitals filed for this visit.   Subjective Assessment - 10/29/20 1536    Subjective  Pt denies any pain. Pt reports "she tried to kill me" referring to PT. Worked her legs!    Pertinent History PMH is significant for: h/o Rt CVA in 2007; anxiety & depression, HTN, mild to  moderate aortic insufficiency.    Limitations Fall Risk. High BP controlled with meds.    Patient Stated Goals play my games, want some feeling in my hand (LUE),    Currently in Pain? No/denies                        OT Treatments/Exercises (OP) - 10/29/20 1546      Exercises   Exercises Shoulder      Shoulder Exercises: Seated   Retraction Left;10 reps;Theraband   x3 sets   Theraband Level (Shoulder Retraction) Level 1 (Yellow)    Horizontal ABduction Both;10 reps;Theraband   x 3 sets for proprioceptive input   Theraband Level (Shoulder Horizontal ABduction) Level 1 (Yellow)    Flexion Left;10 reps;Theraband   x3 sets - for proprioceptive input.   Theraband Level (Shoulder Flexion) Level 1 (Yellow)   positioning and assistance for weakness provided     Neurological Re-education Exercises   Other Grasp and Release Exercises  Box and Blocks assessment  - 12 blocks    Other Grasp and Release Exercises  Picking up 1 inch blocks with LUE with 2 lb wrist weight for proprioceptive input and controlling ataxic like movements in LUE .  Other Weight-Bearing Exercises 1 weight bearing in LUE with diagonal reach x 10 for 2 sets for proprioceptive input into LUE to address tremors/ataxic movements                    OT Short Term Goals - 10/29/20 1603      OT SHORT TERM GOAL #1   Title Pt will be independent with HEP 11/03/20    Baseline none issued yet    Time 4    Period Weeks    Status On-going    Target Date 11/03/20      OT SHORT TERM GOAL #2   Title Pt will verbalize understanding of sensory strategies to increase safety for cooking and other ADLs and IADLs.    Baseline have not reviewed yet    Time 4    Period Weeks    Status Achieved      OT SHORT TERM GOAL #3   Title Pt will demontrate improved standing tolerance by completing activity in standing for 10 minutes or greater with pain less than or equal to 6/10    Baseline back pain 8/10 standing 5-10  minutes    Time 4    Period Weeks    Status On-going   9 minutes pain 4/10     OT SHORT TERM GOAL #4   Title Pt will report increased ease with manipulating fasteners and tying shoes with A/E prn.    Baseline increased time and difficulty    Time 4    Period Weeks    Status Achieved      OT SHORT TERM GOAL #5   Title Pt will demonstrate improved functional use of LUE evidenced by Box and Blocks score of 16 or more.    Baseline 11    Time 4    Period Weeks    Status On-going   12            OT Long Term Goals - 10/06/20 1823      OT LONG TERM GOAL #1   Title Pt will be independent with updated HEP 12/01/2020    Baseline none    Time 8    Period Weeks    Status New    Target Date 12/01/20      OT LONG TERM GOAL #2   Title Pt will report functional use of LUE during basic ADLs and IADLs at least 50% of tasks requiring non dominant side.    Baseline 10-15%    Time 8    Period Weeks    Status New      OT LONG TERM GOAL #3   Title Pt will improve functional use of LUE as evidenced by performing 20 blocks on Box & Blocks test    Baseline 11    Time 8    Period Weeks    Status New      OT LONG TERM GOAL #4   Title Pt will demontrate improved activity tolerance by completing activity in standing for 15 minutes or greater with pain less than or equal to 5/10    Baseline 8/10 with standing 5-10 minutes    Time 8    Period Weeks    Status New      OT LONG TERM GOAL #5   Title Pt will complete a mod complex cooking task or home management task with mod I and good safety awareness with reports of increased ease and use of adapted strategies and/or equipment PRN  Baseline diff with sweeping and standing for cooking etc.    Time 8    Period Weeks    Status New                 Plan - 10/29/20 1708    Clinical Impression Statement Pt is progressing towards goals. Improvement noted with coordination with LUE with weighted input    OT Occupational Profile and  History Problem Focused Assessment - Including review of records relating to presenting problem    Occupational performance deficits (Please refer to evaluation for details): ADL's;IADL's;Leisure    Body Structure / Function / Physical Skills ADL;Decreased knowledge of use of DME;Strength;Tone;Pain;GMC;Dexterity;Balance;Proprioception;UE functional use;ROM;IADL;Sensation;Flexibility;Coordination;FMC    Cognitive Skills Problem Solve;Perception    Rehab Potential Fair    Clinical Decision Making Limited treatment options, no task modification necessary    Comorbidities Affecting Occupational Performance: May have comorbidities impacting occupational performance    Modification or Assistance to Complete Evaluation  No modification of tasks or assist necessary to complete eval    OT Frequency 2x / week    OT Duration 8 weeks    OT Treatment/Interventions Self-care/ADL training;DME and/or AE instruction;Balance training;Therapeutic Air traffic controller;Therapeutic exercise;Cognitive remediation/compensation;Passive range of motion;Functional Mobility Training;Neuromuscular education;Manual Therapy;Patient/family education;Energy conservation    Plan wrist weighted LUE tasks    Consulted and Agree with Plan of Care Patient           Patient will benefit from skilled therapeutic intervention in order to improve the following deficits and impairments:   Body Structure / Function / Physical Skills: ADL, Decreased knowledge of use of DME, Strength, Tone, Pain, GMC, Dexterity, Balance, Proprioception, UE functional use, ROM, IADL, Sensation, Flexibility, Coordination, FMC Cognitive Skills: Problem Solve, Perception     Visit Diagnosis: Muscle weakness (generalized)  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 54 04/03/2020  . Nutritional counseling 01/19/2018  . Primary osteoarthritis of left knee 01/03/2016  . Mild to Moderate Aortic Insufficiency   . Hypertension   . Hyperlipidemia   . Morbid obesity ([place])   . Pulmonary hypertension ([place]) 01/30/2013  . Orthostatic hypotension 12/18/2012  . Aortic valve disorder 07/29/2010  . CHEST PAIN-PRECORDIAL 07/29/2010  . CYST OF THYROID 01/06/2010  . HYPOKALEMIA 01/06/2010  . HYPERLIPIDEMIA 12/24/2009  . OBESITY 12/24/2009  . HYPERTENSION, UNSPECIFIED 12/24/2009  . Personal history of unspecified circulatory disease 12/24/2009  . PEPTIC ULCER DISEASE, HX OF 12/24/2009    Zachery Conch MOT, OTR/L  10/29/2020, 5:13 PM  [facility] [address] [place], [place], [address] Phone: [phone]   Fax:  [phone]  Name: Desirae Mancusi MRN: [id number] Date of Birth: [birth year]

## 2020-10-31 NOTE — Therapy (Signed)
[facility] [address] [place], [place], [address] Phone: [phone]   Fax:  [phone]  Physical Therapy Treatment  Patient Details  Name: Chelsea [name] MRN: [id number] Date of Birth: [birth year] Referring Provider (PT): Star Age, MD   Encounter Date: 10/29/2020   10/29/20 1454  PT Visits / Re-Eval  Visit Number 12  Number of Visits 17  Date for PT Re-Evaluation 12/10/20 (written for 60 day POC)  Authorization  Authorization Type UHC Medicare & Medicaid  PT Time Calculation  PT Start Time 1448  PT Stop Time 1530  PT Time Calculation (min) 42 min  PT - End of Session  Equipment Utilized During Treatment Gait belt  Activity Tolerance Patient tolerated treatment well  Behavior During Therapy [facility] for tasks assessed/performed     Past Medical History:  Diagnosis Date  . Anxiety   . Cerebrovascular accident [facility])    a. 2007-->residual left sided weakness  . Chest pain   . Depression   . GERD (gastroesophageal reflux disease)   . Headache   . Hx of hysterectomy   . Hyperlipidemia   . Hypertension   . Mild to Moderate Aortic Insufficiency    a. 08/2014 Echo: EF 55-60%, mild conc LVH, no rwma, Gr 1 DD, mild-mod AI, mild MR.  . Morbid obesity ([place])   . Obesity   . Peptic ulcer disease     Past Surgical History:  Procedure Laterality Date  . ABDOMINAL HYSTERECTOMY    . TOTAL KNEE ARTHROPLASTY Left 01/05/2016   Procedure: TOTAL KNEE ARTHROPLASTY;  Surgeon: Frederik Pear, MD;  Location: [facility];  Service: Orthopedics;  Laterality: Left;  . TUBAL LIGATION      Vitals:   10/29/20 1452  BP: 132/86     10/29/20 1452  Symptoms/Limitations  Subjective No new complaitns. No falls or pain. Does report LE soreness "this therapy is killing my legs"/ Has appt with Gerald Stabs at [facility].  Pertinent History Rt CVA in 2007 with Lt hemiparesis;  anxiety, depression, mild to moderate aortic insufficiency,   arthritis with s/p L TKA in 2/16, thalamic pain syndrome  Limitations Walking;House hold activities  How long can you sit comfortably? No limitation  How long can you stand comfortably? No limitation  How long can you walk comfortably? No limitation  Patient Stated Goals wants to get strength back in arms and legs.  Pain Assessment  Currently in Pain? No/denies  Pain Score 0       10/29/20 1458  Transfers  Transfers Sit to Stand;Stand to Sit  Sit to Stand 5: Supervision  Stand to Sit 5: Supervision  Ambulation/Gait  Ambulation/Gait Yes  Ambulation/Gait Assistance 5: Supervision  Ambulation/Gait Assistance Details around the gym with session  Assistive device Rollator  Gait Pattern Step-through pattern;Decreased step length - right;Decreased stance time - left;Decreased hip/knee flexion - left;Decreased dorsiflexion - left;Decreased weight shift to left;Left genu recurvatum;Poor foot clearance - left;Left foot flat  Ambulation Surface Level;Indoor  High Level Balance  High Level Balance Activities Side stepping;Marching forwards;Backward walking  High Level Balance Comments on blue mat in parallel bars: 4 laps each with light UE support on the bars. cues on posture and ex form/technique   Knee/Hip Exercises: Seated  Long Arc Quad AROM;Strengthening;Left;2 sets;10 reps;Limitations  Long [employer] Limitations with green band, cues for slow and controlled movements  Heel Slides AROM;Strengthening;Left;2 sets;10 reps;Limitations  Heel Slides Limitations with red tband, use of pillow under heel to decr friction  10/29/20 1515  Balance Exercises: Standing  Standing Eyes Closed Narrow base of support (BOS);Wide (BOA);Head turns;Foam/compliant surface;Other reps (comment);30 secs;Limitations  Standing Eyes Closed Limitations on airex with no UE support: feet close hip width apart for EC 30 sec's x 3 reps, then feet wide apart for EC head movements left<>right, up<>down for ~10 reps  each. up to min assit for balance with cues on posture/weight shifting to assist with balance.   Partial Tandem Stance Eyes closed;Foam/compliant surface;3 reps;30 secs;Limitations  Partial Tandem Stance Limitations on airex, no UE support, occasional touch to sturdy surface- cues on posture and weight shifting to assist with balance. min guard to min assist needed with increased postural sway noted.          PT Short Term Goals - 10/08/20 1410      PT SHORT TERM GOAL #1   Title Pt will undergo further assessment of BERG -LTG written as appropriate. ALL STGS DUE 10/09/20    Baseline 29/56    Status Achieved    Target Date 10/09/20      PT SHORT TERM GOAL #2   Title Pt will undergo further assessment of AFO for improved gait mechanics.    Baseline visit with orthotist on 10/03/20    Status Achieved      PT SHORT TERM GOAL #3   Title Pt will improve gait sped to at least 1.8 ft/sec with small base quad in order to decr fall risk.    Baseline 10/08/20: 2.22 ft/sec wtih rollator    Time --    Period --    Status Achieved      PT SHORT TERM GOAL #4   Title Pt will decr TUG to 17 seconds or less with small based quad cane in order to demo decr fall risk.    Baseline 10/08/20: 14.88 sec's with rollator    Time --    Period --    Status Achieved      PT SHORT TERM GOAL #5   Title Pt will improve 5x sit <> stand time with no UE support in 17 seconds or less in order to demo improved BLE strength.    Baseline 10/08/20: 16.69 sec's no UE support from standard height surface    Time --    Period --    Status Achieved             PT Long Term Goals - 10/06/20 1652      PT LONG TERM GOAL #1   Title Pt will be independent with final HEP in order to build upon functional gains made in therapy. ALL LTGS DUE 11/06/20    Time 8    Period Weeks    Status New      PT LONG TERM GOAL #2   Title Pt will improve BERG to at least a 36/56 in order to demo decr fall risk.    Baseline 29/56     Time 8    Period Weeks    Status Revised      PT LONG TERM GOAL #3   Title Pt will improve gait sped to at least 2.2 ft/sec with small base quad in order to improve gait efficiency.    Baseline 1.58 ft/sec    Time 8    Period Weeks    Status New      PT LONG TERM GOAL #4   Title Pt will decr TUG to 15 seconds or less with small based quad cane in order  to demo decr fall risk.    Baseline 19.09 seconds    Time 8    Period Weeks    Status New      PT LONG TERM GOAL #5   Title Pt will ambulate at least 300' over indoor and outdoor unlevel surfaces with supervision with small base quad cane in order to improve functional mobility.    Time 8    Period Weeks    Status New             10/29/20 1454  Plan  Clinical Impression Statement Today's skilled session continued to focus on strengthening and balance training with rest breaks taken as needed. No other issues noted or reported in session. The pt is in agreement with PT plan for holding until she gets her brace. She is to call and schedule an appt once this happens.  Personal Factors and Comorbidities Time since onset of injury/illness/exacerbation;Past/Current Experience;Fitness  Comorbidities h/o Rt CVA in 2007; anxiety & depression, HTN, mild to moderate aortic insufficiency  Examination-Activity Limitations Squat;Transfers;Stand;Locomotion Level  Examination-Participation Restrictions Estate agent;Shop  Pt will benefit from skilled therapeutic intervention in order to improve on the following deficits Decreased strength;Decreased range of motion;Decreased activity tolerance;Abnormal gait;Decreased balance;Decreased coordination;Difficulty walking;Dizziness;Impaired sensation;Impaired tone  Stability/Clinical Decision Making Stable/Uncomplicated  Rehab Potential Good  PT Frequency 2x / week  PT Duration 8 weeks  PT Treatment/Interventions ADLs/Self Care Home Management;Therapeutic exercise;Therapeutic  activities;Patient/family education;Passive range of motion;DME Instruction;Gait training;Functional mobility training;Stair training;Balance training;Neuromuscular re-education;Orthotic Fit/Training;Vestibular;Energy conservation  PT Next Visit Plan pt on hold until she gets her brace. 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 54 04/03/2020  . Nutritional counseling 01/19/2018  . Primary osteoarthritis of left knee 01/03/2016  . Mild to Moderate Aortic Insufficiency   . Hypertension   . Hyperlipidemia   . Morbid obesity ([place])   . Pulmonary hypertension ([place]) 01/30/2013  . Orthostatic hypotension 12/18/2012  . Aortic valve disorder 07/29/2010  . CHEST PAIN-PRECORDIAL 07/29/2010  . CYST OF THYROID 01/06/2010  . HYPOKALEMIA 01/06/2010  . HYPERLIPIDEMIA 12/24/2009  . OBESITY 12/24/2009  . HYPERTENSION, UNSPECIFIED 12/24/2009  . Personal history of unspecified circulatory disease 12/24/2009  . PEPTIC ULCER DISEASE, HX OF 12/24/2009    Willow Ora, PTA, [facility] [address] [place], [place] [address] [phone] 10/31/20, 10:33 AM   Name: Chelsea [name] MRN: [id number] Date of Birth: January 02, 1951

## 2020-11-03 ENCOUNTER — Ambulatory Visit: Payer: Medicare Other | Admitting: Physical Therapy

## 2020-11-03 ENCOUNTER — Other Ambulatory Visit: Payer: Self-pay

## 2020-11-03 ENCOUNTER — Ambulatory Visit: Payer: Medicare Other | Admitting: Occupational Therapy

## 2020-11-03 DIAGNOSIS — I69854 Hemiplegia and hemiparesis following other cerebrovascular disease affecting left non-dominant side: Secondary | ICD-10-CM

## 2020-11-03 DIAGNOSIS — R2681 Unsteadiness on feet: Secondary | ICD-10-CM | POA: Diagnosis not present

## 2020-11-03 DIAGNOSIS — R2689 Other abnormalities of gait and mobility: Secondary | ICD-10-CM | POA: Diagnosis not present

## 2020-11-03 DIAGNOSIS — R278 Other lack of coordination: Secondary | ICD-10-CM | POA: Diagnosis not present

## 2020-11-03 DIAGNOSIS — M6281 Muscle weakness (generalized): Secondary | ICD-10-CM | POA: Diagnosis not present

## 2020-11-03 DIAGNOSIS — I69354 Hemiplegia and hemiparesis following cerebral infarction affecting left non-dominant side: Secondary | ICD-10-CM | POA: Diagnosis not present

## 2020-11-03 NOTE — Patient Instructions (Addendum)
   Your Splint This splint should initially be fitted by a healthcare practitioner.  The healthcare practitioner is responsible for providing wearing instructions and precautions to the patient, other healthcare practitioners and care provider involved in the patient's care.  This splint was custom made for you. Please read the following instructions to learn about wearing and caring for your splint.  Precautions Should your splint cause any of the following problems, remove the splint immediately and contact your therapist/physician.  Swelling  Severe Pain  Pressure Areas  Stiffness  Numbness  Do not wear your splint while operating machinery unless it has been fabricated for that purpose.  When To Wear Your Splint Where your splint according to your therapist/physician instructions. Daytime for 2-3 hours initially.  Checking you skin every 39min-1 hour.  Gradually increase wearing time over the next week (or 2).  Once you can wear splint for 5-6 hours with no problems, you may begin wearing the splint at night.  Care and Cleaning of Your Splint 1. Keep your splint away from open flames. 2. Your splint will lose its shape in temperatures over 135 degrees Farenheit, ( in car windows, near radiators, ovens or in hot water).  Never make any adjustments to your splint, if the splint needs adjusting remove it and make an appointment to see your therapist. 3. Your splint, including the cushion liner may be cleaned with soap and lukewarm water.  Do not immerse in hot water over 135 degrees Farenheit. 4. Straps may be washed with soap and water, but do not moisten the self-adhesive portion. 5. For ink or hard to remove spots use a scouring cleanser which contains chlorine.  Rinse the splint thoroughly after using chlorine cleanser.

## 2020-11-03 NOTE — Therapy (Signed)
[facility] [address] [place], [place], [address] Phone: [phone]   Fax:  [phone]  Occupational Therapy Treatment  Patient Details  Name: Chelsea [name] MRN: [id number] Date of Birth: 09/17/1951 Referring Provider (OT): Star Age, MD   Encounter Date: 11/03/2020   OT End of Session - 11/03/20 1320    Visit Number 8    Number of Visits 17    Date for OT Re-Evaluation 12/01/20    Authorization Type UHC Medicare/Medicaid    Authorization Time Period No Auth Req covered 100%    Authorization - Visit Number 8    Authorization - Number of Visits 10    Progress Note Due on Visit 10    OT Start Time 1318    OT Stop Time 1400    OT Time Calculation (min) 42 min    Activity Tolerance Patient tolerated treatment well    Behavior During Therapy [facility] for tasks assessed/performed           Past Medical History:  Diagnosis Date  . Anxiety   . Cerebrovascular accident [facility])    a. 2007-->residual left sided weakness  . Chest pain   . Depression   . GERD (gastroesophageal reflux disease)   . Headache   . Hx of hysterectomy   . Hyperlipidemia   . Hypertension   . Mild to Moderate Aortic Insufficiency    a. 08/2014 Echo: EF 55-60%, mild conc LVH, no rwma, Gr 1 DD, mild-mod AI, mild MR.  . Morbid obesity ([place])   . Obesity   . Peptic ulcer disease     Past Surgical History:  Procedure Laterality Date  . ABDOMINAL HYSTERECTOMY    . 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 TUBAL LIGATION      There were no vitals filed for this visit.   Subjective Assessment - 11/03/20 1319    Subjective  pt denies pain    Pertinent History PMH is significant for: h/o Rt CVA in 2007; anxiety & depression, HTN, mild to moderate aortic insufficiency.    Limitations Fall Risk. High BP controlled  with meds.    Patient Stated Goals play my games, want some feeling in my hand (LUE),    Currently in Pain? No/denies            Sitting, wt. Bearing through L hand on mat with body on arm movements.  Followed by functional reaching with LUE to grasp/release cylinder object with improved control/decr ataxia after wt. Bearing.  Fabricated L resting hand splint for improved hand/thumb positioning as pt reports waking up with hand in flex and thumb in palm and frequently observes hand in this position when at rest.         OT Education - 11/03/20 1413    Education Details Splint wear/care--see pt instructions (pt reports grandson can help don/doff)    Person(s) Educated Patient    Methods Explanation;Handout;Demonstration    Comprehension Verbalized understanding            OT Short Term Goals - 10/29/20 1603      OT SHORT TERM GOAL #1   Title Pt will be independent with HEP 11/03/20    Baseline none issued yet    Time 4    Period Weeks    Status On-going    Target Date 11/03/20  OT SHORT TERM GOAL #2   Title Pt will verbalize understanding of sensory strategies to increase safety for cooking and other ADLs and IADLs.    Baseline have not reviewed yet    Time 4    Period Weeks    Status Achieved      OT SHORT TERM GOAL #3   Title Pt will demontrate improved standing tolerance by completing activity in standing for 10 minutes or greater with pain less than or equal to 6/10    Baseline back pain 8/10 standing 5-10 minutes    Time 4    Period Weeks    Status On-going   9 minutes pain 4/10     OT SHORT TERM GOAL #4   Title Pt will report increased ease with manipulating fasteners and tying shoes with A/E prn.    Baseline increased time and difficulty    Time 4    Period Weeks    Status Achieved      OT SHORT TERM GOAL #5   Title Pt will demonstrate improved functional use of LUE evidenced by Box and Blocks score of 16 or more.    Baseline 11    Time 4     Period Weeks    Status On-going   12            OT Long Term Goals - 10/06/20 1823      OT LONG TERM GOAL #1   Title Pt will be independent with updated HEP 12/01/2020    Baseline none    Time 8    Period Weeks    Status New    Target Date 12/01/20      OT LONG TERM GOAL #2   Title Pt will report functional use of LUE during basic ADLs and IADLs at least 50% of tasks requiring non dominant side.    Baseline 10-15%    Time 8    Period Weeks    Status New      OT LONG TERM GOAL #3   Title Pt will improve functional use of LUE as evidenced by performing 20 blocks on Box & Blocks test    Baseline 11    Time 8    Period Weeks    Status New      OT LONG TERM GOAL #4   Title Pt will demontrate improved activity tolerance by completing activity in standing for 15 minutes or greater with pain less than or equal to 5/10    Baseline 8/10 with standing 5-10 minutes    Time 8    Period Weeks    Status New      OT LONG TERM GOAL #5   Title Pt will complete a mod complex cooking task or home management task with mod I and good safety awareness with reports of increased ease and use of adapted strategies and/or equipment PRN    Baseline diff with sweeping and standing for cooking etc.    Time 8    Period Weeks    Status New                 Plan - 11/03/20 1320    Clinical Impression Statement Pt demo improved thumb extension/abduction after wt. bearing and stretching.  Fabricated resting hand splint for improved positioning and thumb ext.    OT Occupational Profile and History Problem Focused Assessment - Including review of records relating to presenting problem    Occupational performance deficits (Please refer to evaluation  for details): ADL's;IADL's;Leisure    Body Structure / Function / Physical Skills ADL;Decreased knowledge of use of DME;Strength;Tone;Pain;GMC;Dexterity;Balance;Proprioception;UE functional use;ROM;IADL;Sensation;Flexibility;Coordination;FMC     Cognitive Skills Problem Solve;Perception    Rehab Potential Fair    Clinical Decision Making Limited treatment options, no task modification necessary    Comorbidities Affecting Occupational Performance: May have comorbidities impacting occupational performance    Modification or Assistance to Complete Evaluation  No modification of tasks or assist necessary to complete eval    OT Frequency 2x / week    OT Duration 8 weeks    OT Treatment/Interventions Self-care/ADL training;DME and/or AE instruction;Balance training;Therapeutic Air traffic controller;Therapeutic exercise;Cognitive remediation/compensation;Passive range of motion;Functional Mobility Training;Neuromuscular education;Manual Therapy;Patient/family education;Energy conservation    Plan check resting hand splint; wt. 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 54 04/03/2020  . Nutritional counseling 01/19/2018  . Primary osteoarthritis of left knee 01/03/2016  . Mild to Moderate Aortic Insufficiency   . Hypertension   . Hyperlipidemia   . Morbid obesity ([place])   . Pulmonary hypertension ([place]) 01/30/2013  . Orthostatic hypotension 12/18/2012  . Aortic valve disorder 07/29/2010  . CHEST PAIN-PRECORDIAL 07/29/2010  . CYST OF THYROID 01/06/2010   . HYPOKALEMIA 01/06/2010  . HYPERLIPIDEMIA 12/24/2009  . OBESITY 12/24/2009  . HYPERTENSION, UNSPECIFIED 12/24/2009  . Personal history of unspecified circulatory disease 12/24/2009  . PEPTIC ULCER DISEASE, HX OF 12/24/2009    [facility] 11/03/2020, 2:18 PM  [facility] [address] [place], [place], [address] Phone: [phone]   Fax:  [phone]  Name: Chelsea [name] MRN: [id number] Date of Birth: 06/10/1951   Vianne Bulls, OTR/L [facility] [address] [place], [place]  [address] [phone] phone [phone] 11/03/20 2:19 PM

## 2020-11-05 ENCOUNTER — Other Ambulatory Visit: Payer: Self-pay

## 2020-11-05 ENCOUNTER — Ambulatory Visit: Payer: Medicare Other | Admitting: Occupational Therapy

## 2020-11-05 ENCOUNTER — Encounter: Payer: Self-pay | Admitting: Occupational Therapy

## 2020-11-05 ENCOUNTER — Ambulatory Visit: Payer: Medicare Other | Admitting: Physical Therapy

## 2020-11-05 DIAGNOSIS — R278 Other lack of coordination: Secondary | ICD-10-CM

## 2020-11-05 DIAGNOSIS — I69354 Hemiplegia and hemiparesis following cerebral infarction affecting left non-dominant side: Secondary | ICD-10-CM | POA: Diagnosis not present

## 2020-11-05 DIAGNOSIS — R2681 Unsteadiness on feet: Secondary | ICD-10-CM | POA: Diagnosis not present

## 2020-11-05 DIAGNOSIS — I69854 Hemiplegia and hemiparesis following other cerebrovascular disease affecting left non-dominant side: Secondary | ICD-10-CM

## 2020-11-05 DIAGNOSIS — M6281 Muscle weakness (generalized): Secondary | ICD-10-CM | POA: Diagnosis not present

## 2020-11-05 DIAGNOSIS — R2689 Other abnormalities of gait and mobility: Secondary | ICD-10-CM | POA: Diagnosis not present

## 2020-11-05 NOTE — Therapy (Signed)
[facility] [address] [place], [place], [address] Phone: [phone]   Fax:  [phone]  Occupational Therapy Treatment  Patient Details  Name: Chelsea [name] MRN: [id number] Date of Birth: [birth year] Referring Provider (OT): Star Age, MD   Encounter Date: 11/05/2020   OT End of Session - 11/05/20 1320    Visit Number 9    Number of Visits 17    Date for OT Re-Evaluation 12/01/20    Authorization Type UHC Medicare/Medicaid    Authorization Time Period No Auth Req covered 100%    Authorization - Visit Number 9    Authorization - Number of Visits 10    Progress Note Due on Visit 10    OT Start Time 1318    OT Stop Time 1400    OT Time Calculation (min) 42 min    Activity Tolerance Patient tolerated treatment well    Behavior During Therapy [facility] for tasks assessed/performed           Past Medical History:  Diagnosis Date  . Anxiety   . Cerebrovascular accident [facility])    a. 2007-->residual left sided weakness  . Chest pain   . Depression   . GERD (gastroesophageal reflux disease)   . Headache   . Hx of hysterectomy   . Hyperlipidemia   . Hypertension   . Mild to Moderate Aortic Insufficiency    a. 08/2014 Echo: EF 55-60%, mild conc LVH, no rwma, Gr 1 DD, mild-mod AI, mild MR.  . Morbid obesity ([place])   . Obesity   . Peptic ulcer disease     Past Surgical History:  Procedure Laterality Date  . ABDOMINAL HYSTERECTOMY    . 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 TUBAL LIGATION      There were no vitals filed for this visit.   Subjective Assessment - 11/05/20 1321    Subjective  Pt reports her shoulder hurt this morning but she slept well. Pt denies pain currently.    Pertinent History PMH is significant for: h/o Rt CVA in 2007; anxiety & depression, HTN, mild to  moderate aortic insufficiency.    Limitations Fall Risk. High BP controlled with meds.    Patient Stated Goals play my games, want some feeling in my hand (LUE),    Currently in Pain? No/denies                        OT Treatments/Exercises (OP) - 11/05/20 1710      ADLs   Functional Mobility ambulating and remaining standing for 10 minutes for increase in standing tolerance and activity tolerance for daily activities at home. Pt worked on weight shifting in the kitchen x 3 minutes with mod I and demonstrated appropriate safety awareness for functional mobility with rollator this day.      Neurological Re-education Exercises   Other Grasp and Release Exercises  grasp release with large pegs this day with 2 lb weighted wrist weight on LUE for increased control and coordination and decrease in tremors. Pt required increased time and demonstrated moderate drops                    OT Short Term Goals - 11/05/20 1329      OT SHORT TERM GOAL #1   Title Pt will be independent with HEP  11/03/20    Baseline none issued yet    Time 4    Period Weeks    Status Achieved    Target Date 11/03/20      OT SHORT TERM GOAL #2   Title Pt will verbalize understanding of sensory strategies to increase safety for cooking and other ADLs and IADLs.    Baseline have not reviewed yet    Time 4    Period Weeks    Status Achieved      OT SHORT TERM GOAL #3   Title Pt will demontrate improved standing tolerance by completing activity in standing for 10 minutes or greater with pain less than or equal to 6/10    Baseline back pain 8/10 standing 5-10 minutes    Time 4    Period Weeks    Status Achieved   9 minutes pain 4/10     OT SHORT TERM GOAL #4   Title Pt will report increased ease with manipulating fasteners and tying shoes with A/E prn.    Baseline increased time and difficulty    Time 4    Period Weeks    Status Achieved      OT SHORT TERM GOAL #5   Title Pt will  demonstrate improved functional use of LUE evidenced by Box and Blocks score of 16 or more.    Baseline 11    Time 4    Period Weeks    Status On-going   12            OT Long Term Goals - 11/05/20 1324      OT LONG TERM GOAL #1   Title Pt will be independent with updated HEP 12/01/2020    Baseline none    Time 8    Period Weeks    Status On-going      OT LONG TERM GOAL #2   Title Pt will report functional use of LUE during basic ADLs and IADLs at least 50% of tasks requiring non dominant side.    Baseline 10-15%    Time 8    Period Weeks    Status Achieved      OT LONG TERM GOAL #3   Title Pt will improve functional use of LUE as evidenced by performing 20 blocks on Box & Blocks test    Baseline 11    Time 8    Period Weeks    Status New      OT LONG TERM GOAL #4   Title Pt will demontrate improved activity tolerance by completing activity in standing for 15 minutes or greater with pain less than or equal to 5/10    Baseline 8/10 with standing 5-10 minutes    Time 8    Period Weeks    Status On-going      OT LONG TERM GOAL #5   Title Pt will complete a mod complex cooking task or home management task with mod I and good safety awareness with reports of increased ease and use of adapted strategies and/or equipment PRN    Baseline diff with sweeping and standing for cooking etc.    Time 8    Period Weeks    Status Achieved   completing with mod I at this time                Plan - 11/05/20 1328    Clinical Impression Statement Pt reports some difficulties with splint on hand and it sliding. Pt forgot splint this  day and verbalized understanding of bringing next session.    OT Occupational Profile and History Problem Focused Assessment - Including review of records relating to presenting problem    Occupational performance deficits (Please refer to evaluation for details): ADL's;IADL's;Leisure    Body Structure / Function / Physical Skills ADL;Decreased  knowledge of use of DME;Strength;Tone;Pain;GMC;Dexterity;Balance;Proprioception;UE functional use;ROM;IADL;Sensation;Flexibility;Coordination;FMC    Cognitive Skills Problem Solve;Perception    Rehab Potential Fair    Clinical Decision Making Limited treatment options, no task modification necessary    Comorbidities Affecting Occupational Performance: May have comorbidities impacting occupational performance    Modification or Assistance to Complete Evaluation  No modification of tasks or assist necessary to complete eval    OT Frequency 2x / week    OT Duration 8 weeks    OT Treatment/Interventions Self-care/ADL training;DME and/or AE instruction;Balance training;Therapeutic Air traffic controller;Therapeutic exercise;Cognitive remediation/compensation;Passive range of motion;Functional Mobility Training;Neuromuscular education;Manual Therapy;Patient/family education;Energy conservation    Plan going on hold after the 12/15 appt w/ PT until AFOs come. 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 54 04/03/2020  . Nutritional counseling 01/19/2018  . Primary osteoarthritis of left knee 01/03/2016  . Mild to Moderate Aortic Insufficiency   . Hypertension   . Hyperlipidemia   . Morbid  obesity ([place])   . Pulmonary hypertension ([place]) 01/30/2013  . Orthostatic hypotension 12/18/2012  . Aortic valve disorder 07/29/2010  . CHEST PAIN-PRECORDIAL 07/29/2010  . CYST OF THYROID 01/06/2010  . HYPOKALEMIA 01/06/2010  . HYPERLIPIDEMIA 12/24/2009  . OBESITY 12/24/2009  . HYPERTENSION, UNSPECIFIED 12/24/2009  . Personal history of unspecified circulatory disease 12/24/2009  . PEPTIC ULCER DISEASE, HX OF 12/24/2009    Zachery Conch MOT, OTR/L  11/05/2020, 5:12 PM  [facility] [address] [place], [place], [address] Phone: [phone]   Fax:  [phone]  Name: Chelsea [name] MRN: [id number] Date of Birth: 02-01-1951

## 2020-11-10 ENCOUNTER — Ambulatory Visit: Payer: Medicare Other | Admitting: Physical Therapy

## 2020-11-10 ENCOUNTER — Other Ambulatory Visit: Payer: Self-pay

## 2020-11-10 ENCOUNTER — Ambulatory Visit: Payer: Medicare Other | Admitting: Occupational Therapy

## 2020-11-10 DIAGNOSIS — I69354 Hemiplegia and hemiparesis following cerebral infarction affecting left non-dominant side: Secondary | ICD-10-CM | POA: Diagnosis not present

## 2020-11-10 DIAGNOSIS — M6281 Muscle weakness (generalized): Secondary | ICD-10-CM

## 2020-11-10 DIAGNOSIS — R2681 Unsteadiness on feet: Secondary | ICD-10-CM | POA: Diagnosis not present

## 2020-11-10 DIAGNOSIS — R2689 Other abnormalities of gait and mobility: Secondary | ICD-10-CM | POA: Diagnosis not present

## 2020-11-10 DIAGNOSIS — R278 Other lack of coordination: Secondary | ICD-10-CM

## 2020-11-10 DIAGNOSIS — R41840 Attention and concentration deficit: Secondary | ICD-10-CM

## 2020-11-10 DIAGNOSIS — I69854 Hemiplegia and hemiparesis following other cerebrovascular disease affecting left non-dominant side: Secondary | ICD-10-CM | POA: Diagnosis not present

## 2020-11-10 NOTE — Therapy (Addendum)
[facility] [name] [place], [place], [address] Phone: [phone]   Fax:  [phone]  Occupational Therapy Treatment  Patient Details  Name: Chelsea [name] MRN: [id number] Date of Birth: April 30, 1951 Referring Provider (OT): Star Age, MD   Encounter Date: 11/10/2020   OT End of Session - 11/10/20 1453    Visit Number 10    Number of Visits 17    Date for OT Re-Evaluation 12/01/20    Authorization Type UHC Medicare/Medicaid    Authorization Time Period No Auth Req covered 100%    Authorization - Visit Number 10    Authorization - Number of Visits 17    OT Start Time 6599    OT Stop Time 1530    OT Time Calculation (min) 43 min    Activity Tolerance Patient tolerated treatment well    Behavior During Therapy [facility] for tasks assessed/performed           Past Medical History:  Diagnosis Date  . Anxiety   . Cerebrovascular accident [facility])    a. 2007-->residual left sided weakness  . Chest pain   . Depression   . GERD (gastroesophageal reflux disease)   . Headache   . Hx of hysterectomy   . Hyperlipidemia   . Hypertension   . Mild to Moderate Aortic Insufficiency    a. 08/2014 Echo: EF 55-60%, mild conc LVH, no rwma, Gr 1 DD, mild-mod AI, mild MR.  . Morbid obesity ([place])   . Obesity   . Peptic ulcer disease     Past Surgical History:  Procedure Laterality Date  . ABDOMINAL HYSTERECTOMY    . TOTAL KNEE ARTHROPLASTY Left 01/05/2016   Procedure: TOTAL KNEE ARTHROPLASTY;  Surgeon: Frederik Pear, MD;  Location: [facility];  Service: Orthopedics;  Laterality: Left;  . TUBAL LIGATION      There were no vitals filed for this visit.   Subjective Assessment - 11/10/20 1453    Subjective  Saturday was fine but yesterday was terrible. Pt denies any pain.    Pertinent History PMH is significant for: h/o Rt CVA in 2007; anxiety & depression, HTN, mild to moderate aortic insufficiency.    Limitations Fall Risk.  High BP controlled with meds.    Patient Stated Goals play my games, want some feeling in my hand (LUE),    Currently in Pain? No/denies                        OT Treatments/Exercises (OP) - 11/10/20 1514      Splinting   Splinting modified splint for straps and secure positioning. Pt donned and doffed x 2 for compliance and demonstrating understanding                  OT Education - 11/10/20 1520    Education Details Splint wear/care--see pt instructions    Person(s) Educated Patient    Methods Explanation;Handout;Demonstration    Comprehension Verbalized understanding;Returned demonstration            OT Short Term Goals - 11/10/20 1637      OT SHORT TERM GOAL #1   Title Pt will be independent with HEP 11/03/20    Baseline none issued yet    Time 4    Period Weeks    Status Achieved    Target Date 11/03/20      OT SHORT TERM GOAL #2   Title Pt will verbalize understanding of sensory strategies  to increase safety for cooking and other ADLs and IADLs.    Baseline have not reviewed yet    Time 4    Period Weeks    Status Achieved      OT SHORT TERM GOAL #3   Title Pt will demontrate improved standing tolerance by completing activity in standing for 10 minutes or greater with pain less than or equal to 6/10    Baseline back pain 8/10 standing 5-10 minutes    Time 4    Period Weeks    Status Achieved   9 minutes pain 4/10     OT SHORT TERM GOAL #4   Title Pt will report increased ease with manipulating fasteners and tying shoes with A/E prn.    Baseline increased time and difficulty    Time 4    Period Weeks    Status Achieved      OT SHORT TERM GOAL #5   Title Pt will demonstrate improved functional use of LUE evidenced by Box and Blocks score of 16 or more.    Baseline 11    Time 4    Period Weeks    Status On-going   12            OT Long Term Goals - 11/05/20 1324      OT LONG TERM GOAL #1   Title Pt will be independent with  updated HEP 12/01/2020    Baseline none    Time 8    Period Weeks    Status On-going      OT LONG TERM GOAL #2   Title Pt will report functional use of LUE during basic ADLs and IADLs at least 50% of tasks requiring non dominant side.    Baseline 10-15%    Time 8    Period Weeks    Status Achieved      OT LONG TERM GOAL #3   Title Pt will improve functional use of LUE as evidenced by performing 20 blocks on Box & Blocks test    Baseline 11    Time 8    Period Weeks    Status New      OT LONG TERM GOAL #4   Title Pt will demontrate improved activity tolerance by completing activity in standing for 15 minutes or greater with pain less than or equal to 5/10    Baseline 8/10 with standing 5-10 minutes    Time 8    Period Weeks    Status On-going      OT LONG TERM GOAL #5   Title Pt will complete a mod complex cooking task or home management task with mod I and good safety awareness with reports of increased ease and use of adapted strategies and/or equipment PRN    Baseline diff with sweeping and standing for cooking etc.    Time 8    Period Weeks    Status Achieved   completing with mod I at this time                Plan - 11/10/20 1531    Clinical Impression Statement Pt demonstrating understanding of donning and doffing splint this day. Pt is due for 10th visit progress note for period 10/06/2020 - 11/10/2020. Pt has met 4/5 STGs and is progressing towards LTGs. Pt continues to benefit from skilled OT to target LUE coordination and increasing functional ability and use of LUE for ADLs and IADLs.    OT Occupational Profile and History  Problem Focused Assessment - Including review of records relating to presenting problem    Occupational performance deficits (Please refer to evaluation for details): ADL's;IADL's;Leisure    Body Structure / Function / Physical Skills ADL;Decreased knowledge of use of DME;Strength;Tone;Pain;GMC;Dexterity;Balance;Proprioception;UE functional  use;ROM;IADL;Sensation;Flexibility;Coordination;FMC    Cognitive Skills Problem Solve;Perception    Rehab Potential Fair    Clinical Decision Making Limited treatment options, no task modification necessary    Comorbidities Affecting Occupational Performance: May have comorbidities impacting occupational performance    Modification or Assistance to Complete Evaluation  No modification of tasks or assist necessary to complete eval    OT Frequency 2x / week    OT Duration 8 weeks    OT Treatment/Interventions Self-care/ADL training;DME and/or AE instruction;Balance training;Therapeutic Air traffic controller;Therapeutic exercise;Cognitive remediation/compensation;Passive range of motion;Functional Mobility Training;Neuromuscular education;Manual Therapy;Patient/family education;Energy conservation    Plan going on hold after the 12/15 appt w/ PT until AFOs come. 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 54 04/03/2020  . Nutritional counseling 01/19/2018  . Primary osteoarthritis of left knee 01/03/2016  . Mild to Moderate Aortic  Insufficiency   . Hypertension   . Hyperlipidemia   . Morbid obesity ([place])   . Pulmonary hypertension ([place]) 01/30/2013  . Orthostatic hypotension 12/18/2012  . Aortic valve disorder 07/29/2010  . CHEST PAIN-PRECORDIAL 07/29/2010  . CYST OF THYROID 01/06/2010  . HYPOKALEMIA 01/06/2010  . HYPERLIPIDEMIA 12/24/2009  . OBESITY 12/24/2009  . HYPERTENSION, UNSPECIFIED 12/24/2009  . Personal history of unspecified circulatory disease 12/24/2009  . PEPTIC ULCER DISEASE, HX OF 12/24/2009    Zachery Conch MOT, OTR/L  11/10/2020, 4:39 PM  [facility] [address] [place], [place], [address] Phone: [phone]   Fax:  [phone]  Name: Gerrica Cygan MRN: [id number] Date of Birth: 1951-07-05

## 2020-11-10 NOTE — Patient Instructions (Signed)
   Your Splint This splint should initially be fitted by a healthcare practitioner.  The healthcare practitioner is responsible for providing wearing instructions and precautions to the patient, other healthcare practitioners and care provider involved in the patient's care.  This splint was custom made for you. Please read the following instructions to learn about wearing and caring for your splint.  Precautions Should your splint cause any of the following problems, remove the splint immediately and contact your therapist/physician.  Swelling  Severe Pain  Pressure Areas  Stiffness  Numbness  Do not wear your splint while operating machinery unless it has been fabricated for that purpose.  When To Wear Your Splint Where your splint according to your therapist/physician instructions. Daytime for 2 hours, if tolerating, wear splint for 3 hours after a break, then 4 and so on until tolerating for approx 5-8 hours a day. Then move to wearing splint at nighttime/bed.  Care and Cleaning of Your Splint 1. Keep your splint away from open flames. 2. Your splint will lose its shape in temperatures over 135 degrees Farenheit, ( in car windows, near radiators, ovens or in hot water).  Never make any adjustments to your splint, if the splint needs adjusting remove it and make an appointment to see your therapist. 3. Your splint, including the cushion liner may be cleaned with soap and lukewarm water.  Do not immerse in hot water over 135 degrees Farenheit. 4. Straps may be washed with soap and water, but do not moisten the self-adhesive portion. 5. For ink or hard to remove spots use a scouring cleanser which contains chlorine.  Rinse the splint thoroughly after using chlorine cleanser.

## 2020-11-12 ENCOUNTER — Ambulatory Visit: Payer: Medicare Other | Admitting: Physical Therapy

## 2020-11-12 ENCOUNTER — Encounter: Payer: Self-pay | Admitting: Occupational Therapy

## 2020-11-12 ENCOUNTER — Other Ambulatory Visit: Payer: Self-pay

## 2020-11-12 ENCOUNTER — Ambulatory Visit: Payer: Medicare Other | Admitting: Occupational Therapy

## 2020-11-12 DIAGNOSIS — R41840 Attention and concentration deficit: Secondary | ICD-10-CM

## 2020-11-12 DIAGNOSIS — M6281 Muscle weakness (generalized): Secondary | ICD-10-CM

## 2020-11-12 DIAGNOSIS — I69354 Hemiplegia and hemiparesis following cerebral infarction affecting left non-dominant side: Secondary | ICD-10-CM | POA: Diagnosis not present

## 2020-11-12 DIAGNOSIS — I69854 Hemiplegia and hemiparesis following other cerebrovascular disease affecting left non-dominant side: Secondary | ICD-10-CM

## 2020-11-12 DIAGNOSIS — R2681 Unsteadiness on feet: Secondary | ICD-10-CM | POA: Diagnosis not present

## 2020-11-12 DIAGNOSIS — R278 Other lack of coordination: Secondary | ICD-10-CM | POA: Diagnosis not present

## 2020-11-12 DIAGNOSIS — R2689 Other abnormalities of gait and mobility: Secondary | ICD-10-CM | POA: Diagnosis not present

## 2020-11-12 NOTE — Therapy (Signed)
[facility] [address] [place], [place], [address] Phone: [phone]   Fax:  [phone]  Occupational Therapy Treatment  Patient Details  Name: Chelsea [name] MRN: [id number] Date of Birth: Jan 21, 1951 Referring Provider (OT): Star Age, MD   Encounter Date: 11/12/2020   OT End of Session - 11/12/20 1353    Visit Number 11    Number of Visits 17    Date for OT Re-Evaluation 12/01/20    Authorization Type UHC Medicare/Medicaid    Authorization Time Period No Auth Req covered 100%    Authorization - Visit Number 11    Authorization - Number of Visits 17    OT Start Time 1351    OT Stop Time 1432    OT Time Calculation (min) 41 min    Activity Tolerance Patient tolerated treatment well    Behavior During Therapy WFL for tasks assessed/performed           Past Medical History:  Diagnosis Date  . Anxiety   . Cerebrovascular accident [facility])    a. 2007-->residual left sided weakness  . Chest pain   . Depression   . GERD (gastroesophageal reflux disease)   . Headache   . Hx of hysterectomy   . Hyperlipidemia   . Hypertension   . Mild to Moderate Aortic Insufficiency    a. 08/2014 Echo: EF 55-60%, mild conc LVH, no rwma, Gr 1 DD, mild-mod AI, mild MR.  . Morbid obesity ([name])   . Obesity   . Peptic ulcer disease     Past Surgical History:  Procedure Laterality Date  . ABDOMINAL HYSTERECTOMY    . TOTAL KNEE ARTHROPLASTY Left 01/05/2016   Procedure: TOTAL KNEE ARTHROPLASTY;  Surgeon: Frederik Pear, MD;  Location: [facility];  Service: Orthopedics;  Laterality: Left;  . TUBAL LIGATION      There were no vitals filed for this visit.   Subjective Assessment - 11/12/20 1404    Subjective  Pt denies any pain. Pt reports it's cold outside!    Pertinent History PMH is significant for: h/o Rt CVA in 2007; anxiety & depression, HTN, mild to moderate aortic insufficiency.    Limitations Fall Risk. High BP  controlled with meds.    Patient Stated Goals play my games, want some feeling in my hand (LUE),    Currently in Pain? No/denies                        OT Treatments/Exercises (OP) - 11/12/20 1400      ADLs   Functional Mobility ambulating and standing activities x 10 minutes for increasing in standing tolerance and activity tolerance for IADLs. Pt ambulated approx 120 feet around building with rollator and no reports of pain and/or fatigue. Pt completed standing at table and reaching with RUE with resistance clothespins 1-8#.      Exercises   Exercises Hand      Hand Exercises   Other Hand Exercises Box and Blocks LUE 10      Splinting   Splinting practicing and demonstrating donning and doffing resting hand splint for compliance.                    OT Short Term Goals - 11/10/20 1637      OT SHORT TERM GOAL #1   Title Pt will be independent with HEP 11/03/20    Baseline none issued yet    Time 4  Period Weeks    Status Achieved    Target Date 11/03/20      OT SHORT TERM GOAL #2   Title Pt will verbalize understanding of sensory strategies to increase safety for cooking and other ADLs and IADLs.    Baseline have not reviewed yet    Time 4    Period Weeks    Status Achieved      OT SHORT TERM GOAL #3   Title Pt will demontrate improved standing tolerance by completing activity in standing for 10 minutes or greater with pain less than or equal to 6/10    Baseline back pain 8/10 standing 5-10 minutes    Time 4    Period Weeks    Status Achieved   9 minutes pain 4/10     OT SHORT TERM GOAL #4   Title Pt will report increased ease with manipulating fasteners and tying shoes with A/E prn.    Baseline increased time and difficulty    Time 4    Period Weeks    Status Achieved      OT SHORT TERM GOAL #5   Title Pt will demonstrate improved functional use of LUE evidenced by Box and Blocks score of 16 or more.    Baseline 11    Time 4    Period  Weeks    Status On-going   12            OT Long Term Goals - 11/05/20 1324      OT LONG TERM GOAL #1   Title Pt will be independent with updated HEP 12/01/2020    Baseline none    Time 8    Period Weeks    Status On-going      OT LONG TERM GOAL #2   Title Pt will report functional use of LUE during basic ADLs and IADLs at least 50% of tasks requiring non dominant side.    Baseline 10-15%    Time 8    Period Weeks    Status Achieved      OT LONG TERM GOAL #3   Title Pt will improve functional use of LUE as evidenced by performing 20 blocks on Box & Blocks test    Baseline 11    Time 8    Period Weeks    Status New      OT LONG TERM GOAL #4   Title Pt will demontrate improved activity tolerance by completing activity in standing for 15 minutes or greater with pain less than or equal to 5/10    Baseline 8/10 with standing 5-10 minutes    Time 8    Period Weeks    Status On-going      OT LONG TERM GOAL #5   Title Pt will complete a mod complex cooking task or home management task with mod I and good safety awareness with reports of increased ease and use of adapted strategies and/or equipment PRN    Baseline diff with sweeping and standing for cooking etc.    Time 8    Period Weeks    Status Achieved   completing with mod I at this time                Plan - 11/12/20 1354    Clinical Impression Statement PT continues to present with poor coordination in LUE but is making progress with splint wear and care.    OT Occupational Profile and History Problem Focused Assessment - Including  review of records relating to presenting problem    Occupational performance deficits (Please refer to evaluation for details): ADL's;IADL's;Leisure    Body Structure / Function / Physical Skills ADL;Decreased knowledge of use of DME;Strength;Tone;Pain;GMC;Dexterity;Balance;Proprioception;UE functional use;ROM;IADL;Sensation;Flexibility;Coordination;FMC    Cognitive Skills Problem  Solve;Perception    Rehab Potential Fair    Clinical Decision Making Limited treatment options, no task modification necessary    Comorbidities Affecting Occupational Performance: May have comorbidities impacting occupational performance    Modification or Assistance to Complete Evaluation  No modification of tasks or assist necessary to complete eval    OT Frequency 2x / week    OT Duration 8 weeks    OT Treatment/Interventions Self-care/ADL training;DME and/or AE instruction;Balance training;Therapeutic Air traffic controller;Therapeutic exercise;Cognitive remediation/compensation;Passive range of motion;Functional Mobility Training;Neuromuscular education;Manual Therapy;Patient/family education;Energy conservation    Plan on hold until receive AFO for PT and OT - schedule 2x week for 3 weeks upon return. 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 54 04/03/2020  . Nutritional counseling 01/19/2018  . Primary osteoarthritis of left knee 01/03/2016  . Mild to Moderate Aortic Insufficiency   . Hypertension   . Hyperlipidemia   . Morbid obesity ([place])   . Pulmonary hypertension ([place])  01/30/2013  . Orthostatic hypotension 12/18/2012  . Aortic valve disorder 07/29/2010  . CHEST PAIN-PRECORDIAL 07/29/2010  . CYST OF THYROID 01/06/2010  . HYPOKALEMIA 01/06/2010  . HYPERLIPIDEMIA 12/24/2009  . OBESITY 12/24/2009  . HYPERTENSION, UNSPECIFIED 12/24/2009  . Personal history of unspecified circulatory disease 12/24/2009  . PEPTIC ULCER DISEASE, HX OF 12/24/2009    Zachery Conch MOT, OTR/L  11/12/2020, 2:45 PM  [facility] [address] [place], [place], [address] Phone: [phone]   Fax:  [phone]  Name: Chelsea [name] MRN: [id number] Date of Birth: 03-28-1951

## 2020-11-13 ENCOUNTER — Other Ambulatory Visit: Payer: Self-pay | Admitting: Physician Assistant

## 2020-11-13 DIAGNOSIS — Z1231 Encounter for screening mammogram for malignant neoplasm of breast: Secondary | ICD-10-CM

## 2020-11-26 DIAGNOSIS — M25372 Other instability, left ankle: Secondary | ICD-10-CM | POA: Diagnosis not present

## 2020-12-11 ENCOUNTER — Ambulatory Visit: Payer: Medicare Other | Admitting: Physical Therapy

## 2020-12-11 ENCOUNTER — Encounter: Payer: Self-pay | Admitting: Physical Therapy

## 2020-12-11 ENCOUNTER — Other Ambulatory Visit: Payer: Self-pay

## 2020-12-11 DIAGNOSIS — R2681 Unsteadiness on feet: Secondary | ICD-10-CM | POA: Insufficient documentation

## 2020-12-11 DIAGNOSIS — R278 Other lack of coordination: Secondary | ICD-10-CM | POA: Insufficient documentation

## 2020-12-11 DIAGNOSIS — M6281 Muscle weakness (generalized): Secondary | ICD-10-CM

## 2020-12-11 DIAGNOSIS — I69854 Hemiplegia and hemiparesis following other cerebrovascular disease affecting left non-dominant side: Secondary | ICD-10-CM | POA: Insufficient documentation

## 2020-12-11 NOTE — Therapy (Signed)
[facility] [address] [place], [place], [address] Phone: [phone]   Fax:  [phone]  Patient Details  Name: Chelsea [name] MRN: [id number] Date of Birth: 07-07-1951 Referring Provider:  Trey Sailors, [place]  Encounter Date: 12/11/2020   Arrived - No Charge  Pt returns to PT (after being on hold) after receiving custom L AFO from [employer] for re-cert. Pt reports that she has not been able to wear her AFO as it does not fit in her shoes. Gerald [employer] Community education officer) educated pt that she would need a pair of shoes with arch support in order to comfortably fit brace. Pt has not yet been able to go get them. Attempted clinic shoes to put on pt to do re-cert and assess outcome measures with pt wearing L AFO, however they did not fit. Pt planning on ordering shoes from [employer]. Discussed with pt having someone take her to [employer] so she can try sneakers on to make sure they fit properly with her AFO vs. Ordering online. Pt in agreement and will be going next week. Printed map and gave instructions on where [employer] in [place] is. Instructed pt to call back and reschedule appt to do re-cert once pt has the pair of shoes that she can comfortably wear her AFO in due to pt originally being put on hold until she got an AFO to wear. Pt in agreement.    Arliss Journey, PT, DPT  12/11/2020, 11:37 AM  [facility] [address] [place], [place], [address] Phone: [phone]   Fax:  [phone]

## 2020-12-12 DIAGNOSIS — R059 Cough, unspecified: Secondary | ICD-10-CM | POA: Diagnosis not present

## 2020-12-12 DIAGNOSIS — M62838 Other muscle spasm: Secondary | ICD-10-CM | POA: Diagnosis not present

## 2020-12-12 DIAGNOSIS — K219 Gastro-esophageal reflux disease without esophagitis: Secondary | ICD-10-CM | POA: Diagnosis not present

## 2020-12-12 DIAGNOSIS — R600 Localized edema: Secondary | ICD-10-CM | POA: Diagnosis not present

## 2020-12-12 DIAGNOSIS — I10 Essential (primary) hypertension: Secondary | ICD-10-CM | POA: Diagnosis not present

## 2020-12-12 DIAGNOSIS — R3915 Urgency of urination: Secondary | ICD-10-CM | POA: Diagnosis not present

## 2020-12-12 DIAGNOSIS — E782 Mixed hyperlipidemia: Secondary | ICD-10-CM | POA: Diagnosis not present

## 2020-12-24 ENCOUNTER — Ambulatory Visit
Admission: RE | Admit: 2020-12-24 | Discharge: 2020-12-24 | Disposition: A | Discharge status: Home / Self Care | Payer: Medicare Other | Source: Ambulatory Visit | Attending: Physician Assistant | Admitting: Physician Assistant

## 2020-12-24 ENCOUNTER — Other Ambulatory Visit: Payer: Self-pay

## 2020-12-24 DIAGNOSIS — Z1231 Encounter for screening mammogram for malignant neoplasm of breast: Secondary | ICD-10-CM

## 2020-12-26 ENCOUNTER — Other Ambulatory Visit: Payer: Self-pay

## 2020-12-26 ENCOUNTER — Ambulatory Visit: Payer: Medicare Other | Attending: Family Medicine | Admitting: Physical Therapy

## 2020-12-26 ENCOUNTER — Encounter: Payer: Self-pay | Admitting: Physical Therapy

## 2020-12-26 DIAGNOSIS — M6281 Muscle weakness (generalized): Secondary | ICD-10-CM

## 2020-12-26 DIAGNOSIS — R278 Other lack of coordination: Secondary | ICD-10-CM

## 2020-12-26 DIAGNOSIS — R2681 Unsteadiness on feet: Secondary | ICD-10-CM | POA: Diagnosis not present

## 2020-12-26 DIAGNOSIS — I69854 Hemiplegia and hemiparesis following other cerebrovascular disease affecting left non-dominant side: Secondary | ICD-10-CM

## 2020-12-26 NOTE — Therapy (Signed)
[facility] [address] [place], [place], [address] Phone: [phone]   Fax:  [phone]  Physical Therapy Treatment/Re-Cert  Patient Details  Name: Chelsea [name] MRN: [id number] Date of Birth: 1951/09/29 Referring Provider (PT): Star Age, MD   Encounter Date: 12/26/2020   PT End of Session - 12/26/20 1533    Visit Number 12    Number of Visits 20    Date for PT Re-Evaluation 02/24/21   written for 30 day POC   Authorization Type UHC Medicare & Medicaid    PT Start Time 1446    PT Stop Time 1528    PT Time Calculation (min) 42 min    Equipment Utilized During Treatment Gait belt    Activity Tolerance Patient tolerated treatment well    Behavior During Therapy [facility] for tasks assessed/performed           Past Medical History:  Diagnosis Date  . Anxiety   . Cerebrovascular accident [facility])    a. 2007-->residual left sided weakness  . Chest pain   . Depression   . GERD (gastroesophageal reflux disease)   . Headache   . Hx of hysterectomy   . Hyperlipidemia   . Hypertension   . Mild to Moderate Aortic Insufficiency    a. 08/2014 Echo: EF 55-60%, mild conc LVH, no rwma, Gr 1 DD, mild-mod AI, mild MR.  . Morbid obesity ([place])   . Obesity   . Peptic ulcer disease     Past Surgical History:  Procedure Laterality Date  . ABDOMINAL HYSTERECTOMY    . TOTAL KNEE ARTHROPLASTY Left 01/05/2016   Procedure: TOTAL KNEE ARTHROPLASTY;  Surgeon: Frederik Pear, MD;  Location: [place];  Service: Orthopedics;  Laterality: Left;  . TUBAL LIGATION      There were no vitals filed for this visit.   Subjective Assessment - 12/26/20 1449    Subjective Returns with a new pair of shoes and L AFO. Reports rubbing on the L medial side of the ankle from the brace - sees Gerald Stabs next week on the 4th for and adjustment.    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 No limitation    How long can you stand comfortably? No limitation    How long can you walk comfortably? No limitation    Patient Stated Goals wants to get strength back in arms and legs.    Currently in Pain? No/denies    Pain Onset More than a month ago              [facility] PT Assessment - 12/26/20 0001      Assessment   Medical Diagnosis CVA (2007)    Referring Provider (PT) Star Age, MD      Prior Function   Level of Independence Independent      Ambulation/Gait   Ambulation/Gait Assistance 5: Supervision    Assistive device Rollator    Gait Pattern Step-through pattern;Decreased step length - right;Decreased stance time - left;Decreased hip/knee flexion - left;Decreased dorsiflexion - left;Decreased weight shift to left;Left genu recurvatum;Poor foot clearance - left;Left foot flat    Ambulation Surface Indoor;Level    Gait velocity 13.06 = 2.51 ft/sec      Standardized Balance Assessment   Standardized Balance Assessment Merrilee Jansky  Balance Test      Timed Up and Go Test   Normal TUG (seconds) 22.85    TUG Comments with rollator                         [facility] Adult PT Treatment/Exercise - 12/26/20 0001      Transfers   Transfers Sit to Stand;Stand to Sit    Sit to Stand 5: Supervision    Five time sit to stand comments  21.97 seconds with no UE support    Stand to Sit 5: Supervision      Ambulation/Gait   Ambulation/Gait Yes    Ambulation/Gait Assistance Details clinic distances with rollator and L AFO, pt continues to need cues to stay close to rollator      Berg Balance Test   Sit to Stand Able to stand without using hands and stabilize independently    Standing Unsupported Able to stand safely 2 minutes    Sitting with Back Unsupported but Feet Supported on Floor or Stool Able to sit safely and  securely 2 minutes    Stand to Sit Sits safely with minimal use of hands    Transfers Able to transfer safely, minor use of hands    Standing Unsupported with Eyes Closed Able to stand 10 seconds safely    Standing Ubsupported with Feet Together Able to place feet together independently but unable to hold for 30 seconds    From Standing, Reach Forward with Outstretched Arm Can reach forward >12 cm safely (5")    From Standing Position, Pick up Object from Floor Able to pick up shoe, needs supervision    From Standing Position, Turn to Look Behind Over each Shoulder Looks behind one side only/other side shows less weight shift    Turn 360 Degrees Needs close supervision or verbal cueing    Standing Unsupported, Alternately Place Feet on Step/Stool Able to complete >2 steps/needs minimal assist    Standing Unsupported, One Foot in Front Needs help to step but can hold 15 seconds    Standing on One Leg Unable to try or needs assist to prevent fall    Total Score 38      Therapeutic Activites    Therapeutic Activities Other Therapeutic Activities    Other Therapeutic Activities pt wearing new L AFO today which pt reports has been rubbing at the medial part of her ankle with pt wearing band aids today to help decr rubbing.  Pt sees orthotist next week to get any adjustments needed to her brace to decr rubbing. Discussed with pt wear schedule for brace as pt reports she may be wearing her brace for too many hours at a time (4+ hours at a time), educated pt to wear for 2 hours and off for 2-3 hours at a time until pt can get brace looked at by orthotist, pt in agreement. Also discussed with pt having her bring her rollator by a medical supply store to have the brakes tightened as they do not lock and makes it unsafe for transfers                   PT Short Term Goals - 12/26/20 1534      PT SHORT TERM GOAL #1   Title ALL STGS = LTGS             PT Long Term Goals - 12/26/20 1534       PT LONG TERM  GOAL #1   Title Pt will be independent with final HEP in order to build upon functional gains made in therapy. ALL LTGS DUE 11/06/20    Baseline did not have time to assess on 12/26/20    Time 8    Period Weeks    Status Deferred      PT LONG TERM GOAL #2   Title Pt will improve BERG to at least a 36/56 in order to demo decr fall risk.    Baseline 29/56, 38/56 on 12/26/20    Time 8    Period Weeks    Status Achieved      PT LONG TERM GOAL #3   Title Pt will improve gait sped to at least 2.2 ft/sec with small base quad in order to improve gait efficiency.    Baseline 1.58 ft/sec, 2.51 ft/sec with rollator    Time 8    Period Weeks    Status Partially Met      PT LONG TERM GOAL #4   Title Pt will decr TUG to 15 seconds or less with small based quad cane in order to demo decr fall risk.    Baseline 19.09 seconds, 22.85 seconds with rollator    Time 8    Period Weeks    Status Not Met      PT LONG TERM GOAL #5   Title Pt will ambulate at least 300' over indoor and outdoor unlevel surfaces with supervision with small base quad cane in order to improve functional mobility.    Baseline did not assess on 12/26/20 due to time constraints, pt currently using rollator for all mobility    Time 8    Period Weeks    Status Deferred            Revised/on-going LTGs for re-cert:     PT Long Term Goals - 12/26/20 1548      PT LONG TERM GOAL #1   Title Pt will be independent with final HEP in order to build upon functional gains made in therapy. ALL LTGS DUE 01/30/21    Baseline did not have time to assess on 12/26/20    Time 5   due to delay in scheduling   Period Weeks    Status On-going    Target Date 01/30/21      PT LONG TERM GOAL #2   Title Pt will improve BERG to at least a 42/56 in order to demo decr fall risk.    Baseline 38/56 on 12/26/20    Time 5    Period Weeks    Status Revised      PT LONG TERM GOAL #3   Title Pt will improve gait sped to at least 2.7  ft/sec with rollator in order to demo improved community mobility.    Baseline 1.58 ft/sec, 2.51 ft/sec with rollator    Time 5    Period Weeks    Status Revised      PT LONG TERM GOAL #4   Title Pt will decr TUG to 17 seconds or less with rollator in order to demo decr fall risk.    Baseline 19.09 seconds, 22.85 seconds with rollator    Time 5    Period Weeks    Status Revised      PT LONG TERM GOAL #5   Title Pt will decr 5x sit <> Stand to 18 seconds or less with no UE support in order to demo improved BLE strength.  Baseline 21.97 seconds    Time 5    Period Weeks    Status New              Plan - 12/26/20 1545    Clinical Impression Statement Pt returns to clinic today after receiving her L AFO and wearing proper sneakers to fit her brace. Pt reports rubbing at medial ankle on L - sees orthotist next week for any adjustments needed for her brace. Educated pt to decr her current wear time with L AFO until she sees orthotist. Checked pt's LTGs today. Unable to assess outdoor gait and HEP due to time constraints. Pt improved her BERG score to a 38/56 (previously 29/56) and gait speed with rollator to 2.51 ft/sec, indicating pt is a limited community ambulator. Pt had an incr in her TUG score today to 22.85 seconds and 5x sit <> stand time at 21.97 seconds without UE support. Discussed with pt now that pt has her L AFO will re-cert for 2x week for 4 weeks to finalize HEP and continue to work on strength/balance/gait with new brace. Pt in agreement. LTGs revised/updated as appropriate.    Personal Factors and Comorbidities Time since onset of injury/illness/exacerbation;Past/Current Experience;Fitness    Comorbidities h/o Rt CVA in 2007; anxiety & depression, HTN, mild to moderate aortic insufficiency    Examination-Activity Limitations Squat;Transfers;Stand;Locomotion Level    Examination-Participation Restrictions Estate agent;Shop    Stability/Clinical  Decision Making Stable/Uncomplicated    Rehab Potential Good    PT Frequency 2x / week    PT Duration 4 weeks    PT Treatment/Interventions ADLs/Self Care Home Management;Therapeutic exercise;Therapeutic activities;Patient/family education;Passive range of motion;DME Instruction;Gait training;Functional mobility training;Stair training;Balance training;Neuromuscular re-education;Orthotic Fit/Training;Vestibular;Energy conservation    PT Next Visit Plan how is brace feeling after chris made adjustment? review HEP and update as appropriate. 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 53 04/03/2020  . Nutritional counseling 01/19/2018  . Primary osteoarthritis of left knee 01/03/2016  . Mild to Moderate Aortic Insufficiency   . Hypertension   . Hyperlipidemia   . Morbid obesity ([place])   . Pulmonary hypertension ([place]) 01/30/2013  . Orthostatic hypotension 12/18/2012  . Aortic valve disorder 07/29/2010  . CHEST PAIN-PRECORDIAL 07/29/2010  . CYST OF THYROID 01/06/2010  . HYPOKALEMIA 01/06/2010  . HYPERLIPIDEMIA 12/24/2009  . OBESITY 12/24/2009  . HYPERTENSION, UNSPECIFIED 12/24/2009  . Personal history of unspecified circulatory disease 12/24/2009  . PEPTIC ULCER DISEASE, HX OF 12/24/2009    Arliss Journey, PT, DPT  12/26/2020, 3:47 PM  [facility] [address] [place], [place], [address] Phone: [phone]   Fax:  [phone]  Name: Chelsea [name] MRN: [id number] Date of Birth: Sep 05, 1951

## 2021-01-02 ENCOUNTER — Telehealth: Payer: Self-pay | Admitting: *Deleted

## 2021-01-02 NOTE — Telephone Encounter (Signed)
This CN placed initial contact call by phone to client.  Left message with my call back number and name.  Karene Fry, RN, MSN, [place] Office [phone] Cell

## 2021-01-05 ENCOUNTER — Ambulatory Visit: Payer: Medicare Other | Admitting: Physical Therapy

## 2021-01-07 ENCOUNTER — Ambulatory Visit: Payer: Medicare Other | Admitting: Physical Therapy

## 2021-01-09 ENCOUNTER — Ambulatory Visit: Payer: Medicare Other | Attending: Family Medicine | Admitting: Physical Therapy

## 2021-01-09 ENCOUNTER — Other Ambulatory Visit: Payer: Self-pay

## 2021-01-09 ENCOUNTER — Encounter: Payer: Self-pay | Admitting: Physical Therapy

## 2021-01-09 DIAGNOSIS — I69354 Hemiplegia and hemiparesis following cerebral infarction affecting left non-dominant side: Secondary | ICD-10-CM | POA: Insufficient documentation

## 2021-01-09 DIAGNOSIS — M6281 Muscle weakness (generalized): Secondary | ICD-10-CM | POA: Insufficient documentation

## 2021-01-09 DIAGNOSIS — R2689 Other abnormalities of gait and mobility: Secondary | ICD-10-CM | POA: Diagnosis not present

## 2021-01-09 DIAGNOSIS — R278 Other lack of coordination: Secondary | ICD-10-CM | POA: Diagnosis not present

## 2021-01-09 DIAGNOSIS — R2681 Unsteadiness on feet: Secondary | ICD-10-CM

## 2021-01-09 DIAGNOSIS — I69854 Hemiplegia and hemiparesis following other cerebrovascular disease affecting left non-dominant side: Secondary | ICD-10-CM | POA: Diagnosis not present

## 2021-01-09 NOTE — Therapy (Signed)
[facility] [address] [place], [place], [address] Phone: [phone]   Fax:  [phone]  Physical Therapy Treatment  Patient Details  Name: Chelsea [name] MRN: [id number] Date of Birth: 08-23-1951 Referring Provider (PT): Star Age, MD   Encounter Date: 01/09/2021   PT End of Session - 01/09/21 1625    Visit Number 13    Number of Visits 20    Date for PT Re-Evaluation 02/24/21   written for 30 day POC   Authorization Type UHC Medicare & Medicaid    PT Start Time 1400    PT Stop Time 1444    PT Time Calculation (min) 44 min    Equipment Utilized During Treatment Gait belt    Activity Tolerance Patient tolerated treatment well    Behavior During Therapy [facility] for tasks assessed/performed           Past Medical History:  Diagnosis Date  . Anxiety   . Cerebrovascular accident [facility])    a. 2007-->residual left sided weakness  . Chest pain   . Depression   . GERD (gastroesophageal reflux disease)   . Headache   . Hx of hysterectomy   . Hyperlipidemia   . Hypertension   . Mild to Moderate Aortic Insufficiency    a. 08/2014 Echo: EF 55-60%, mild conc LVH, no rwma, Gr 1 DD, mild-mod AI, mild MR.  . Morbid obesity ([place])   . Obesity   . Peptic ulcer disease     Past Surgical History:  Procedure Laterality Date  . ABDOMINAL HYSTERECTOMY    . TOTAL KNEE ARTHROPLASTY Left 01/05/2016   Procedure: TOTAL KNEE ARTHROPLASTY;  Surgeon: Frederik Pear, MD;  Location: [facility];  Service: Orthopedics;  Laterality: Left;  . TUBAL LIGATION      There were no vitals filed for this visit.   Subjective Assessment - 01/09/21 1404    Subjective Saw Gerald Stabs - had some adjustments made to her brace. Reports it is feeling a little better, but it is still rubbing some. Reports the spot on her ankle is getting better. Is still going to contact [facility] to make further adjustments.    Pertinent History Rt CVA in 2007 with Lt  hemiparesis;  anxiety, depression, mild to moderate aortic insufficiency,  arthritis with s/p L TKA in 2/16, thalamic pain syndrome    Limitations Walking;House hold activities    How long can you sit comfortably? No limitation    How long can you stand comfortably? No limitation    How long can you walk comfortably? No limitation    Patient Stated Goals wants to get strength back in arms and legs.    Currently in Pain? No/denies    Pain Onset More than a month ago                             [facility] Adult PT Treatment/Exercise - 01/09/21 1415      Ambulation/Gait   Ambulation/Gait Yes    Ambulation/Gait Assistance 5: Supervision    Ambulation/Gait Assistance Details clinic distances with rollator - cues to stay close and keep feet under the seat as pt with tendency to push too far anteriorly    Assistive device Rollator    Gait Pattern Step-through pattern;Decreased step length - right;Decreased stance time - left;Decreased hip/knee flexion - left;Decreased dorsiflexion - left;Decreased weight shift to left;Left genu recurvatum;Poor foot clearance - left;Left foot flat    Ambulation  Surface Level;Indoor      Exercises   Exercises Other Exercises    Other Exercises  seated L hip ABD with green tband - 2 x 10 reps, verbal and demo cues for proper technique and slowed movement, added to HEP      Knee/Hip Exercises: Aerobic   Nustep with BLE only gear 3 for 6 minutes for strengthening, ROM, activity tolerance      Knee/Hip Exercises: Standing   Lateral Step Up Left;2 sets;10 reps;Hand Hold: 2;Step Height: 4"   Verbal and tactile cues to prevent L knee hyperextension, 2nd set floating RLE for incr SLS with LLE    Forward Step Up Left;1 set;10 reps;Hand Hold: 2;Step Height: 4"               Balance Exercises - 01/09/21 0001      Balance Exercises: Standing   Standing Eyes Opened Narrow base of support (BOS);Foam/compliant surface    Standing Eyes Opened  Limitations on blue air ex: x10 reps head nods, x10 reps head turns    Standing Eyes Closed Wide (BOA);Foam/compliant surface;3 reps;20 secs    Standing Eyes Closed Limitations intermittent touch to bars for balance, min guard/min A             PT Education - 01/09/21 1625    Education Details added seated hip abduction with LLE and green tband to HEP, not wearing AFO as much at home (2 hours on and a couple hours off) - pt currently wearing sometimes all day. calling chris to make another appt to have brace adjusted so it isn't rubbing    Person(s) Educated Patient    Methods Explanation;Demonstration    Comprehension Verbalized understanding;Returned demonstration            PT Short Term Goals - 12/26/20 1534      PT SHORT TERM GOAL #1   Title ALL STGS = LTGS             PT Long Term Goals - 12/26/20 1548      PT LONG TERM GOAL #1   Title Pt will be independent with final HEP in order to build upon functional gains made in therapy. ALL LTGS DUE 01/30/21    Baseline did not have time to assess on 12/26/20    Time 5   due to delay in scheduling   Period Weeks    Status On-going    Target Date 01/30/21      PT LONG TERM GOAL #2   Title Pt will improve BERG to at least a 42/56 in order to demo decr fall risk.    Baseline 38/56 on 12/26/20    Time 5    Period Weeks    Status Revised      PT LONG TERM GOAL #3   Title Pt will improve gait sped to at least 2.7 ft/sec with rollator in order to demo improved community mobility.    Baseline 1.58 ft/sec, 2.51 ft/sec with rollator    Time 5    Period Weeks    Status Revised      PT LONG TERM GOAL #4   Title Pt will decr TUG to 17 seconds or less with rollator in order to demo decr fall risk.    Baseline 19.09 seconds, 22.85 seconds with rollator    Time 5    Period Weeks    Status Revised      PT LONG TERM GOAL #5   Title Pt will decr  5x sit <> Stand to 18 seconds or less with no UE support in order to demo improved BLE  strength.    Baseline 21.97 seconds    Time 5    Period Weeks    Status New                 Plan - 01/09/21 1630    Clinical Impression Statement Today's skilled session focused on BLE strengthening and standing balance strategies on compliant surfaces. Pt tolerated session well - needing tactile cues at times to prevent L knee hyperextension during step ups. Added seated L hip ABD with green tband to HEP. Will need to review remainder of HEP at next session.    Personal Factors and Comorbidities Time since onset of injury/illness/exacerbation;Past/Current Experience;Fitness    Comorbidities h/o Rt CVA in 2007; anxiety & depression, HTN, mild to moderate aortic insufficiency    Examination-Activity Limitations Squat;Transfers;Stand;Locomotion Level    Examination-Participation Restrictions Estate agent;Shop    Stability/Clinical Decision Making Stable/Uncomplicated    Rehab Potential Good    PT Frequency 2x / week    PT Duration 4 weeks    PT Treatment/Interventions ADLs/Self Care Home Management;Therapeutic exercise;Therapeutic activities;Patient/family education;Passive range of motion;DME Instruction;Gait training;Functional mobility training;Stair training;Balance training;Neuromuscular re-education;Orthotic Fit/Training;Vestibular;Energy conservation    PT Next Visit Plan did pt call Gerald Stabs? review HEP and update as appropriate. strength/balance    Consulted and Agree with Plan of Care Patient           Patient will benefit from skilled therapeutic intervention in order to improve the following deficits and impairments:  Decreased strength,Decreased range of motion,Decreased activity tolerance,Abnormal gait,Decreased balance,Decreased coordination,Difficulty walking,Dizziness,Impaired sensation,Impaired tone  Visit Diagnosis: Muscle weakness (generalized)  Hemiplegia and hemiparesis following other cerebrovascular disease affecting left non-dominant  side (HCC)  Hemiparesis affecting left side as late effect of cerebrovascular accident ([place])  Unsteadiness on feet     Problem List Patient Active Problem List   Diagnosis Date Noted  . 53 04/03/2020  . Nutritional counseling 01/19/2018  . Primary osteoarthritis of left knee 01/03/2016  . Mild to Moderate Aortic Insufficiency   . Hypertension   . Hyperlipidemia   . Morbid obesity ([place])   . Pulmonary hypertension ([place]) 01/30/2013  . Orthostatic hypotension 12/18/2012  . Aortic valve disorder 07/29/2010  . CHEST PAIN-PRECORDIAL 07/29/2010  . CYST OF THYROID 01/06/2010  . HYPOKALEMIA 01/06/2010  . HYPERLIPIDEMIA 12/24/2009  . OBESITY 12/24/2009  . HYPERTENSION, UNSPECIFIED 12/24/2009  . Personal history of unspecified circulatory disease 12/24/2009  . PEPTIC ULCER DISEASE, HX OF 12/24/2009    Arliss Journey, PT, DPT 01/09/2021, 4:31 PM  [facility] [address] [place], [place], [address] Phone: [phone]   Fax:  [phone]  Name: Chelsea [name] MRN: [id number] Date of Birth: May 24, 1951

## 2021-01-12 ENCOUNTER — Other Ambulatory Visit: Payer: Self-pay

## 2021-01-12 ENCOUNTER — Encounter: Payer: Self-pay | Admitting: Physical Therapy

## 2021-01-12 ENCOUNTER — Ambulatory Visit: Payer: Medicare Other | Admitting: Physical Therapy

## 2021-01-12 ENCOUNTER — Telehealth: Payer: Self-pay | Admitting: Adult Health

## 2021-01-12 DIAGNOSIS — M6281 Muscle weakness (generalized): Secondary | ICD-10-CM | POA: Diagnosis not present

## 2021-01-12 DIAGNOSIS — I69854 Hemiplegia and hemiparesis following other cerebrovascular disease affecting left non-dominant side: Secondary | ICD-10-CM | POA: Diagnosis not present

## 2021-01-12 DIAGNOSIS — R2681 Unsteadiness on feet: Secondary | ICD-10-CM

## 2021-01-12 DIAGNOSIS — I69354 Hemiplegia and hemiparesis following cerebral infarction affecting left non-dominant side: Secondary | ICD-10-CM | POA: Diagnosis not present

## 2021-01-12 DIAGNOSIS — R2689 Other abnormalities of gait and mobility: Secondary | ICD-10-CM | POA: Diagnosis not present

## 2021-01-12 DIAGNOSIS — R278 Other lack of coordination: Secondary | ICD-10-CM | POA: Diagnosis not present

## 2021-01-12 NOTE — Patient Instructions (Signed)
Access Code: [email or web address] URL: [email or web address] Date: 01/12/2021 Prepared by: Janann August  Exercises Supine Bridge with Resistance Band - 1 x daily - 5 x weekly - 2 sets - 10 reps Supine March with Resistance Band - 1 x daily - 5 x weekly - 2 sets - 10 reps Clamshell with Resistance - 1 x daily - 5 x weekly - 2 sets - 10 reps Seated Hamstring Curl with Anchored Resistance - 1 x daily - 5 x weekly - 2 sets - 10 reps Heel Toe Raises with Counter Support - 1 x daily - 5 x weekly - 2 sets - 10 reps Seated Hip Abduction with Resistance - 1 x daily - 5 x weekly - 2 sets - 10 reps

## 2021-01-12 NOTE — Therapy (Signed)
[facility] [address] [place], [place], [address] Phone: [phone]   Fax:  [phone]  Physical Therapy Treatment  Patient Details  Name: Chelsea [name] MRN: [id number] Date of Birth: [birth year] Referring Provider (PT): Star Age, MD   Encounter Date: 01/12/2021   PT End of Session - 01/12/21 1627    Visit Number 14    Number of Visits 20    Date for PT Re-Evaluation 02/24/21   written for 30 day POC   Authorization Type UHC Medicare & Medicaid    PT Start Time 1535    PT Stop Time 1615    PT Time Calculation (min) 40 min    Equipment Utilized During Treatment Gait belt    Activity Tolerance Patient tolerated treatment well    Behavior During Therapy [facility] for tasks assessed/performed           Past Medical History:  Diagnosis Date  . Anxiety   . Cerebrovascular accident [facility])    a. 2007-->residual left sided weakness  . Chest pain   . Depression   . GERD (gastroesophageal reflux disease)   . Headache   . Hx of hysterectomy   . Hyperlipidemia   . Hypertension   . Mild to Moderate Aortic Insufficiency    a. 08/2014 Echo: EF 55-60%, mild conc LVH, no rwma, Gr 1 DD, mild-mod AI, mild MR.  . Morbid obesity ([place])   . Obesity   . Peptic ulcer disease     Past Surgical History:  Procedure Laterality Date  . ABDOMINAL HYSTERECTOMY    . TOTAL KNEE ARTHROPLASTY Left 01/05/2016   Procedure: TOTAL KNEE ARTHROPLASTY;  Surgeon: Frederik Pear, MD;  Location: [facility];  Service: Orthopedics;  Laterality: Left;  . TUBAL LIGATION      There were no vitals filed for this visit.   Subjective Assessment - 01/12/21 1537    Subjective Sees Gerald Stabs again on thursday for further adjustments to her AFO to prevent rubbing. Was sore after last session.    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 No limitation    How long can you stand comfortably? No limitation    How long can you walk comfortably? No limitation    Patient Stated Goals wants to get strength back in arms and legs.    Currently in Pain? No/denies    Pain Onset More than a month ago                             [facility] Adult PT Treatment/Exercise - 01/12/21 1546      Knee/Hip Exercises: Aerobic   Stepper SciFit with BLE only at gear 2.0 for 7 minutes for strengthening, activity tolerance      Knee/Hip Exercises: Seated   Long Arc Quad AROM;Strengthening;Left;2 sets;10 reps;Limitations    Long [employer] Limitations with green band, cues for slow and controlled movements    Ball Squeeze x10 reps with 3 second hold for hip ADD             Access Code: [email or web address] URL: [email or web address] Date: 01/12/2021 Prepared by: Janann August  Upgraded pt's HEP to include green tband resistance: See MedBridge for more details.   Exercises Supine Bridge with  Resistance Band - 1 x daily - 5 x weekly - 2 sets - 10 reps Supine March with Resistance Band - 1 x daily - 5 x weekly - 2 sets - 10 reps Clamshell with Resistance - 1 x daily - 5 x weekly - 2 sets - 10 reps Seated Hamstring Curl with Anchored Resistance - 1 x daily - 5 x weekly - 2 sets - 10 reps Heel Toe Raises with Counter Support - 1 x daily - 5 x weekly - 2 sets - 10 reps Seated Hip Abduction with Resistance - 1 x daily - 5 x weekly - 2 sets - 10 reps       PT Education - 01/12/21 1627    Education Details reviewed pt's HEP and upgraded with green tband    Person(s) Educated Patient    Methods Explanation;Demonstration;Handout    Comprehension Verbalized understanding;Returned demonstration            PT Short Term Goals - 12/26/20 1534      PT SHORT TERM GOAL #1   Title ALL STGS = LTGS             PT Long Term Goals -  12/26/20 1548      PT LONG TERM GOAL #1   Title Pt will be independent with final HEP in order to build upon functional gains made in therapy. ALL LTGS DUE 01/30/21    Baseline did not have time to assess on 12/26/20    Time 5   due to delay in scheduling   Period Weeks    Status On-going    Target Date 01/30/21      PT LONG TERM GOAL #2   Title Pt will improve BERG to at least a 42/56 in order to demo decr fall risk.    Baseline 38/56 on 12/26/20    Time 5    Period Weeks    Status Revised      PT LONG TERM GOAL #3   Title Pt will improve gait sped to at least 2.7 ft/sec with rollator in order to demo improved community mobility.    Baseline 1.58 ft/sec, 2.51 ft/sec with rollator    Time 5    Period Weeks    Status Revised      PT LONG TERM GOAL #4   Title Pt will decr TUG to 17 seconds or less with rollator in order to demo decr fall risk.    Baseline 19.09 seconds, 22.85 seconds with rollator    Time 5    Period Weeks    Status Revised      PT LONG TERM GOAL #5   Title Pt will decr 5x sit <> Stand to 18 seconds or less with no UE support in order to demo improved BLE strength.    Baseline 21.97 seconds    Time 5    Period Weeks    Status New                 Plan - 01/12/21 1628    Clinical Impression Statement Today's skilled session focused on BLE strengthening. Reviewed and upgraded pt's HEP to include green theraband resistance. Pt tolerated session well with incr resistance. Will continue to progress towards LTGs.    Personal Factors and Comorbidities Time since onset of injury/illness/exacerbation;Past/Current Experience;Fitness    Comorbidities h/o Rt CVA in 2007; anxiety & depression, HTN, mild to moderate aortic insufficiency    Examination-Activity Limitations Squat;Transfers;Stand;Locomotion Level    Examination-Participation  Restrictions Community Activity;Cleaning;Laundry;Shop    Stability/Clinical Decision Making Stable/Uncomplicated    Rehab  Potential Good    PT Frequency 2x / week    PT Duration 4 weeks    PT Treatment/Interventions ADLs/Self Care Home Management;Therapeutic exercise;Therapeutic activities;Patient/family education;Passive range of motion;DME Instruction;Gait training;Functional mobility training;Stair training;Balance training;Neuromuscular re-education;Orthotic Fit/Training;Vestibular;Energy conservation    PT Next Visit Plan pt to see chris on thurs to have brace adjusted to decr rubbing. Scifit.  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 54 04/03/2020  . Nutritional counseling 01/19/2018  . Primary osteoarthritis of left knee 01/03/2016  . Mild to Moderate Aortic Insufficiency   . Hypertension   . Hyperlipidemia   . Morbid obesity ([place])   . Pulmonary hypertension ([place]) 01/30/2013  . Orthostatic hypotension 12/18/2012  . Aortic valve disorder 07/29/2010  . CHEST PAIN-PRECORDIAL 07/29/2010  . CYST OF THYROID 01/06/2010  . HYPOKALEMIA 01/06/2010  . HYPERLIPIDEMIA 12/24/2009  . OBESITY 12/24/2009  . HYPERTENSION, UNSPECIFIED 12/24/2009  . Personal history of unspecified circulatory disease 12/24/2009  . PEPTIC ULCER DISEASE, HX OF 12/24/2009    Arliss Journey, PT, DPT  01/12/2021, 4:30 PM  [facility] [address] [place], [place], [address] Phone: [phone]   Fax:  [phone]  Name: Chelsea [name] MRN: [id number] Date of Birth: 05-01-1951

## 2021-01-12 NOTE — Telephone Encounter (Signed)
Patient walked in to the lobby today requesting a letter to get out of jury duty. Letter to be addressed and sent to: [place]: Attention [place], [place], [place] [address] (should be in byMarch 10th) Best call back is [phone]

## 2021-01-13 ENCOUNTER — Telehealth: Payer: Self-pay | Admitting: Interventional Cardiology

## 2021-01-13 ENCOUNTER — Encounter: Payer: Self-pay | Admitting: *Deleted

## 2021-01-13 DIAGNOSIS — I10 Essential (primary) hypertension: Secondary | ICD-10-CM

## 2021-01-13 MED ORDER — SPIRONOLACTONE 25 MG PO TABS
25.0000 mg | ORAL_TABLET | Freq: Every day | ORAL | 2 refills | Status: DC
Start: 2021-01-13 — End: 2021-09-24

## 2021-01-13 MED ORDER — DOXAZOSIN MESYLATE 1 MG PO TABS: 1.0000 mg | ORAL_TABLET | Freq: Every day | ORAL | 2 refills | Status: DC

## 2021-01-13 NOTE — Telephone Encounter (Signed)
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

## 2021-01-13 NOTE — Telephone Encounter (Signed)
Pt's medications were sent to pt's pharmacy as requested. Confirmation received.  

## 2021-01-13 NOTE — Telephone Encounter (Signed)
Letter written to Dr. Rexene Alberts to sign.

## 2021-01-13 NOTE — Telephone Encounter (Signed)
   *  STAT* If patient is at the pharmacy, call can be transferred to refill team.   1. Which medications need to be refilled? (please list name of each medication and dose if known)   spironolactone (ALDACTONE) 25 MG tablet    doxazosin (CARDURA) 1 MG tablet    2. Which pharmacy/location (including street and city if local pharmacy) is medication to be sent to? [place], [place]  3. Do they need a 30 day or 90 day supply? 90 days

## 2021-01-13 NOTE — Telephone Encounter (Signed)
The patient saw Dr. Rexene Alberts last in September.  She should be included in this decision.

## 2021-01-13 NOTE — Telephone Encounter (Signed)
I called and LMVM for pt that need copy or actual jury summons for to make decision for this.  Has juror # as well.  Why cannot do??

## 2021-01-13 NOTE — Telephone Encounter (Signed)
Pt returned call back Juror # is [phone].  She said she has had stroke, cant stand for long periods, has vertigo, does not drive.  Has used [facility] for PT when asked about who brings to appt.

## 2021-01-14 ENCOUNTER — Other Ambulatory Visit: Payer: Self-pay

## 2021-01-14 ENCOUNTER — Ambulatory Visit: Payer: Medicare Other

## 2021-01-14 DIAGNOSIS — R2681 Unsteadiness on feet: Secondary | ICD-10-CM | POA: Diagnosis not present

## 2021-01-14 DIAGNOSIS — M6281 Muscle weakness (generalized): Secondary | ICD-10-CM

## 2021-01-14 DIAGNOSIS — R278 Other lack of coordination: Secondary | ICD-10-CM | POA: Diagnosis not present

## 2021-01-14 DIAGNOSIS — I69354 Hemiplegia and hemiparesis following cerebral infarction affecting left non-dominant side: Secondary | ICD-10-CM | POA: Diagnosis not present

## 2021-01-14 DIAGNOSIS — I69854 Hemiplegia and hemiparesis following other cerebrovascular disease affecting left non-dominant side: Secondary | ICD-10-CM | POA: Diagnosis not present

## 2021-01-14 DIAGNOSIS — R2689 Other abnormalities of gait and mobility: Secondary | ICD-10-CM | POA: Diagnosis not present

## 2021-01-14 NOTE — Therapy (Signed)
[facility] [address] [place], [place], [address] Phone: [phone]   Fax:  [phone]  Physical Therapy Treatment  Patient Details  Name: Chelsea [name] MRN: [id number] Date of Birth: 03-Oct-1951 Referring Provider (PT): Star Age, MD   Encounter Date: 01/14/2021   PT End of Session - 01/14/21 1711    Visit Number 15    Number of Visits 20    Date for PT Re-Evaluation 02/24/21   written for 30 day POC   Authorization Type UHC Medicare & Medicaid    PT Start Time 1615    PT Stop Time 1700    PT Time Calculation (min) 45 min    Equipment Utilized During Treatment Gait belt    Activity Tolerance Patient tolerated treatment well;Patient limited by fatigue    Behavior During Therapy WFL for tasks assessed/performed           Past Medical History:  Diagnosis Date   Anxiety    Cerebrovascular accident [facility])    a. 2007-->residual left sided weakness   Chest pain    Depression    GERD (gastroesophageal reflux disease)    Headache    Hx of hysterectomy    Hyperlipidemia    Hypertension    Mild to Moderate Aortic Insufficiency    a. 08/2014 Echo: EF 55-60%, mild conc LVH, no rwma, Gr 1 DD, mild-mod AI, mild MR.   Morbid obesity ([place])    Obesity    Peptic ulcer disease     Past Surgical History:  Procedure Laterality Date   ABDOMINAL HYSTERECTOMY     TOTAL KNEE ARTHROPLASTY Left 01/05/2016   Procedure: TOTAL KNEE ARTHROPLASTY;  Surgeon: Frederik Pear, MD;  Location: [facility];  Service: Orthopedics;  Laterality: Left;   TUBAL LIGATION      There were no vitals filed for this visit.   Subjective Assessment - 01/14/21 1626    Subjective L AFO has been rubbing medially, will see CPO tomorrow for adjustments    Pertinent History Rt CVA in 2007 with Lt hemiparesis;  anxiety, depression, mild to moderate aortic insufficiency,  arthritis with s/p L TKA in 2/16, thalamic pain syndrome     Limitations Walking;House hold activities    How long can you sit comfortably? No limitation    How long can you stand comfortably? No limitation    How long can you walk comfortably? No limitation    Patient Stated Goals wants to get strength back in arms and legs.    Currently in Pain? No/denies    Pain Score 0-No pain    Pain Onset More than a month ago                             [facility] Adult PT Treatment/Exercise - 01/14/21 0001      Transfers   Transfers Sit to Stand    Comments STS from mat, arms crossed 2x10 wih CGA of PT      Knee/Hip Exercises: Aerobic   Stepper Scifit 8" at L2      Knee/Hip Exercises: Seated   Long Arc Quad Strengthening;Right;Left;2 sets;15 reps    Long Arc Quad Limitations green t-band    [facility] 2x15 w 3 s hold               Balance Exercises - 01/14/21 0001      Balance Exercises: Standing   Standing Eyes Opened Narrow base of  support (BOS);Foam/compliant surface;2 reps;30 secs    Standing Eyes Opened Limitations blue airex with no UE support    Standing Eyes Closed Narrow base of support (BOS);Foam/compliant surface;2 reps;30 secs    Standing Eyes Closed Limitations intermittent UE support    Stepping Strategy Anterior;Posterior;Lateral;UE support;10 reps    Stepping Strategy Limitations performed from airex to 4" step leading and lowering with same leg A/P, both LEs on step for M/L    Tandem Gait Forward;Upper extremity support;5 reps    Tandem Gait Limitations performed in // bars in tandem with retrowalking    Retro Gait 5 reps;Upper extremity support    Retro Gait Limitations performed for 5 rips in // bars with UE support    Sidestepping 5 reps    Sidestepping Limitations performed for 5 trops in // bars BUE support               PT Short Term Goals - 12/26/20 1534      PT SHORT TERM GOAL #1   Title ALL STGS = LTGS             PT Long Term Goals - 12/26/20 1548      PT LONG TERM GOAL #1    Title Pt will be independent with final HEP in order to build upon functional gains made in therapy. ALL LTGS DUE 01/30/21    Baseline did not have time to assess on 12/26/20    Time 5   due to delay in scheduling   Period Weeks    Status On-going    Target Date 01/30/21      PT LONG TERM GOAL #2   Title Pt will improve BERG to at least a 42/56 in order to demo decr fall risk.    Baseline 38/56 on 12/26/20    Time 5    Period Weeks    Status Revised      PT LONG TERM GOAL #3   Title Pt will improve gait sped to at least 2.7 ft/sec with rollator in order to demo improved community mobility.    Baseline 1.58 ft/sec, 2.51 ft/sec with rollator    Time 5    Period Weeks    Status Revised      PT LONG TERM GOAL #4   Title Pt will decr TUG to 17 seconds or less with rollator in order to demo decr fall risk.    Baseline 19.09 seconds, 22.85 seconds with rollator    Time 5    Period Weeks    Status Revised      PT LONG TERM GOAL #5   Title Pt will decr 5x sit <> Stand to 18 seconds or less with no UE support in order to demo improved BLE strength.    Baseline 21.97 seconds    Time 5    Period Weeks    Status New                 Plan - 01/14/21 1711    Clinical Impression Statement focus of session was LE strength and balance training, stepping strategies encouraging foot placement, static standing with EO/EC, some fatigue present as noted by poor foot placement as activity progressed, fixed rollator brakes    Personal Factors and Comorbidities Time since onset of injury/illness/exacerbation;Past/Current Experience;Fitness    Comorbidities h/o Rt CVA in 2007; anxiety & depression, HTN, mild to moderate aortic insufficiency    Examination-Activity Limitations Squat;Transfers;Stand;Locomotion Level    Examination-Participation Restrictions Estate agent;Shop  Stability/Clinical Decision Making Stable/Uncomplicated    Rehab Potential Good    PT Frequency 2x  / week    PT Duration 4 weeks    PT Treatment/Interventions ADLs/Self Care Home Management;Therapeutic exercise;Therapeutic activities;Patient/family education;Passive range of motion;DME Instruction;Gait training;Functional mobility training;Stair training;Balance training;Neuromuscular re-education;Orthotic Fit/Training;Vestibular;Energy conservation    PT Next Visit Plan continue strengthening, increase step ht, sidestepping against t-band, SLS with vectors    Consulted and Agree with Plan of Care Patient           Patient will benefit from skilled therapeutic intervention in order to improve the following deficits and impairments:  Decreased strength,Decreased range of motion,Decreased activity tolerance,Abnormal gait,Decreased balance,Decreased coordination,Difficulty walking,Dizziness,Impaired sensation,Impaired tone  Visit Diagnosis: Unsteadiness on feet  Muscle weakness (generalized)     Problem List Patient Active Problem List   Diagnosis Date Noted   54 04/03/2020   Nutritional counseling 01/19/2018   Primary osteoarthritis of left knee 01/03/2016   Mild to Moderate Aortic Insufficiency    Hypertension    Hyperlipidemia    Morbid obesity ([place])    Pulmonary hypertension ([place]) 01/30/2013   Orthostatic hypotension 12/18/2012   Aortic valve disorder 07/29/2010   CHEST PAIN-PRECORDIAL 07/29/2010   CYST OF THYROID 01/06/2010   HYPOKALEMIA 01/06/2010   HYPERLIPIDEMIA 12/24/2009   OBESITY 12/24/2009   HYPERTENSION, UNSPECIFIED 12/24/2009   Personal history of unspecified circulatory disease 12/24/2009   PEPTIC ULCER DISEASE, HX OF 12/24/2009    Lanice Shirts PT 01/14/2021, 5:18 PM  [facility] [address] [place], [place], [address] Phone: [phone]   Fax:  [phone]  Name: Chelsea [name] MRN: [id number] Date of Birth: December 15, 1950

## 2021-01-14 NOTE — Telephone Encounter (Signed)
[place] letter signed.  To MR.

## 2021-01-19 ENCOUNTER — Encounter: Payer: Self-pay | Admitting: Physical Therapy

## 2021-01-19 ENCOUNTER — Ambulatory Visit: Payer: Medicare Other | Admitting: Physical Therapy

## 2021-01-19 ENCOUNTER — Other Ambulatory Visit: Payer: Self-pay

## 2021-01-19 DIAGNOSIS — R2689 Other abnormalities of gait and mobility: Secondary | ICD-10-CM

## 2021-01-19 DIAGNOSIS — R2681 Unsteadiness on feet: Secondary | ICD-10-CM | POA: Diagnosis not present

## 2021-01-19 DIAGNOSIS — R278 Other lack of coordination: Secondary | ICD-10-CM | POA: Diagnosis not present

## 2021-01-19 DIAGNOSIS — M6281 Muscle weakness (generalized): Secondary | ICD-10-CM

## 2021-01-19 DIAGNOSIS — I69354 Hemiplegia and hemiparesis following cerebral infarction affecting left non-dominant side: Secondary | ICD-10-CM | POA: Diagnosis not present

## 2021-01-19 DIAGNOSIS — I69854 Hemiplegia and hemiparesis following other cerebrovascular disease affecting left non-dominant side: Secondary | ICD-10-CM | POA: Diagnosis not present

## 2021-01-19 NOTE — Therapy (Signed)
[facility] [address] [place], [place], [address] Phone: [phone]   Fax:  [phone]  Physical Therapy Treatment  Patient Details  Name: Chelsea [name] MRN: [id number] Date of Birth: Jul 10, 1951 Referring Provider (PT): Star Age, MD   Encounter Date: 01/19/2021   PT End of Session - 01/19/21 1432    Visit Number 16    Number of Visits 20    Date for PT Re-Evaluation 02/24/21   written for 30 day POC   Authorization Type UHC Medicare & Medicaid    PT Start Time 1342   PT able to see pt early   PT Stop Time 1422    PT Time Calculation (min) 40 min    Equipment Utilized During Treatment Gait belt    Activity Tolerance Patient tolerated treatment well;Patient limited by fatigue    Behavior During Therapy [facility] for tasks assessed/performed           Past Medical History:  Diagnosis Date  . Anxiety   . Cerebrovascular accident [facility])    a. 2007-->residual left sided weakness  . Chest pain   . Depression   . GERD (gastroesophageal reflux disease)   . Headache   . Hx of hysterectomy   . Hyperlipidemia   . Hypertension   . Mild to Moderate Aortic Insufficiency    a. 08/2014 Echo: EF 55-60%, mild conc LVH, no rwma, Gr 1 DD, mild-mod AI, mild MR.  . Morbid obesity ([place])   . Obesity   . Peptic ulcer disease     Past Surgical History:  Procedure Laterality Date  . ABDOMINAL HYSTERECTOMY    . TOTAL KNEE ARTHROPLASTY Left 01/05/2016   Procedure: TOTAL KNEE ARTHROPLASTY;  Surgeon: Frederik Pear, MD;  Location: [facility];  Service: Orthopedics;  Laterality: Left;  . TUBAL LIGATION      There were no vitals filed for this visit.   Subjective Assessment - 01/19/21 1345    Subjective Saw the orthotist last thursday - made another adjustment and had the insole in her shoe taken out and pt reports it is feeling much better. Reports legs were sore after last time.    Pertinent History Rt CVA in 2007 with Lt  hemiparesis;  anxiety, depression, mild to moderate aortic insufficiency,  arthritis with s/p L TKA in 2/16, thalamic pain syndrome    Limitations Walking;House hold activities    How long can you sit comfortably? No limitation    How long can you stand comfortably? No limitation    How long can you walk comfortably? No limitation    Patient Stated Goals wants to get strength back in arms and legs.    Currently in Pain? No/denies    Pain Onset More than a month ago                             [facility] Adult PT Treatment/Exercise - 01/19/21 1354      Knee/Hip Exercises: Aerobic   Stepper SciFit with BLE only at gear 2.0 for 7 minutes for strengthening, activity tolerance      Knee/Hip Exercises: Standing   Hip Flexion AROM;Stengthening;Left;2 sets;10 reps;Knee bent    Hip Flexion Limitations holding with RUE for support, cues for slowed and controlled    Forward Step Up Left;10 reps;Hand Hold: 2;2 sets;Step Height: 6"    Forward Step Up Limitations keeping LLE on step for each rep, cues for tall posture, tactile and  verbal cues to prevent knee hyperextension      Knee/Hip Exercises: Seated   Long Arc Quad Strengthening;Left;1 set;10 reps    [employer] Limitations with green band, cues for slow and controlled movements    Heel Slides AROM;Strengthening;Left;10 reps;Limitations;1 set    Heel Slides Limitations with green tband, use of pillow under heel to decr friction, therapist assisting with keeping pt's L foot in proper position     Abduction/Adduction  Strengthening;1 set;10 reps;Left;Right    Abd/Adduction Limitations with green therband, keeping RLE still and moving LLE out and in, performed same activity with other leg, cues for slowed and controlled. x10 reps each leg               Balance Exercises - 01/19/21 0001      Balance Exercises: Standing   SLS with Vectors Foam/compliant surface;Limitations    SLS with Vectors Limitations on blue air ex:  alternating foot taps to 6" step with UE support x12 reps B              PT Education - 01/19/21 1431    Education Details plan to D/C at current end of POC    Person(s) Educated Patient    Methods Explanation    Comprehension Verbalized understanding            PT Short Term Goals - 12/26/20 1534      PT SHORT TERM GOAL #1   Title ALL STGS = LTGS             PT Long Term Goals - 12/26/20 1548      PT LONG TERM GOAL #1   Title Pt will be independent with final HEP in order to build upon functional gains made in therapy. ALL LTGS DUE 01/30/21    Baseline did not have time to assess on 12/26/20    Time 5   due to delay in scheduling   Period Weeks    Status On-going    Target Date 01/30/21      PT LONG TERM GOAL #2   Title Pt will improve BERG to at least a 42/56 in order to demo decr fall risk.    Baseline 38/56 on 12/26/20    Time 5    Period Weeks    Status Revised      PT LONG TERM GOAL #3   Title Pt will improve gait sped to at least 2.7 ft/sec with rollator in order to demo improved community mobility.    Baseline 1.58 ft/sec, 2.51 ft/sec with rollator    Time 5    Period Weeks    Status Revised      PT LONG TERM GOAL #4   Title Pt will decr TUG to 17 seconds or less with rollator in order to demo decr fall risk.    Baseline 19.09 seconds, 22.85 seconds with rollator    Time 5    Period Weeks    Status Revised      PT LONG TERM GOAL #5   Title Pt will decr 5x sit <> Stand to 18 seconds or less with no UE support in order to demo improved BLE strength.    Baseline 21.97 seconds    Time 5    Period Weeks    Status New                 Plan - 01/19/21 1434    Clinical Impression Statement Today's skilled session continued to focus  on balance training and BLE strengthening. Pt needed intermittent breaks due to fatigue and feeling sore. Discussed wrapping up at end of current POC (end of next week) - pt in agreement.    Personal Factors and  Comorbidities Time since onset of injury/illness/exacerbation;Past/Current Experience;Fitness    Comorbidities h/o Rt CVA in 2007; anxiety & depression, HTN, mild to moderate aortic insufficiency    Examination-Activity Limitations Squat;Transfers;Stand;Locomotion Level    Examination-Participation Restrictions Estate agent;Shop    Stability/Clinical Decision Making Stable/Uncomplicated    Rehab Potential Good    PT Frequency 2x / week    PT Duration 4 weeks    PT Treatment/Interventions ADLs/Self Care Home Management;Therapeutic exercise;Therapeutic activities;Patient/family education;Passive range of motion;DME Instruction;Gait training;Functional mobility training;Stair training;Balance training;Neuromuscular re-education;Orthotic Fit/Training;Vestibular;Energy conservation    PT Next Visit Plan continue strengthening, increase step ht, sidestepping against t-band, SLS with vectors    Consulted and Agree with Plan of Care Patient           Patient will benefit from skilled therapeutic intervention in order to improve the following deficits and impairments:  Decreased strength,Decreased range of motion,Decreased activity tolerance,Abnormal gait,Decreased balance,Decreased coordination,Difficulty walking,Dizziness,Impaired sensation,Impaired tone  Visit Diagnosis: Unsteadiness on feet  Muscle weakness (generalized)  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 54 04/03/2020  . Nutritional counseling 01/19/2018  . Primary osteoarthritis of left knee 01/03/2016  . Mild to Moderate Aortic Insufficiency   . Hypertension   . Hyperlipidemia   . Morbid obesity ([place])   . Pulmonary hypertension ([place]) 01/30/2013  . Orthostatic hypotension 12/18/2012  . Aortic valve disorder 07/29/2010  .  CHEST PAIN-PRECORDIAL 07/29/2010  . CYST OF THYROID 01/06/2010  . HYPOKALEMIA 01/06/2010  . HYPERLIPIDEMIA 12/24/2009  . OBESITY 12/24/2009  . HYPERTENSION, UNSPECIFIED 12/24/2009  . Personal history of unspecified circulatory disease 12/24/2009  . PEPTIC ULCER DISEASE, HX OF 12/24/2009    Arliss Journey, PT, DPT  01/19/2021, 2:35 PM  [facility] [address] [place], [place], [address] Phone: [phone]   Fax:  [phone]  Name: Chelsea [name] MRN: [id number] Date of Birth: 1951-07-05

## 2021-01-21 ENCOUNTER — Encounter: Payer: Self-pay | Admitting: Physical Therapy

## 2021-01-21 ENCOUNTER — Other Ambulatory Visit: Payer: Self-pay

## 2021-01-21 ENCOUNTER — Ambulatory Visit: Payer: Medicare Other | Admitting: Physical Therapy

## 2021-01-21 DIAGNOSIS — R2681 Unsteadiness on feet: Secondary | ICD-10-CM

## 2021-01-21 DIAGNOSIS — I69354 Hemiplegia and hemiparesis following cerebral infarction affecting left non-dominant side: Secondary | ICD-10-CM | POA: Diagnosis not present

## 2021-01-21 DIAGNOSIS — M6281 Muscle weakness (generalized): Secondary | ICD-10-CM | POA: Diagnosis not present

## 2021-01-21 DIAGNOSIS — I69854 Hemiplegia and hemiparesis following other cerebrovascular disease affecting left non-dominant side: Secondary | ICD-10-CM | POA: Diagnosis not present

## 2021-01-21 DIAGNOSIS — R2689 Other abnormalities of gait and mobility: Secondary | ICD-10-CM

## 2021-01-21 DIAGNOSIS — R278 Other lack of coordination: Secondary | ICD-10-CM

## 2021-01-21 NOTE — Therapy (Signed)
[facility] [address] [place], [place], [address] Phone: [phone]   Fax:  [phone]  Physical Therapy Treatment  Patient Details  Name: Chelsea [name] MRN: [id number] Date of Birth: [birth year] Referring Provider (PT): Star Age, MD   Encounter Date: 01/21/2021   PT End of Session - 01/21/21 1714    Visit Number 17    Number of Visits 20    Date for PT Re-Evaluation 02/24/21   written for 30 day POC   Authorization Type UHC Medicare & Medicaid    PT Start Time 1531    PT Stop Time 1613    PT Time Calculation (min) 42 min    Equipment Utilized During Treatment Gait belt    Activity Tolerance Patient tolerated treatment well;Patient limited by fatigue    Behavior During Therapy [facility] for tasks assessed/performed           Past Medical History:  Diagnosis Date  . Anxiety   . Cerebrovascular accident [facility])    a. 2007-->residual left sided weakness  . Chest pain   . Depression   . GERD (gastroesophageal reflux disease)   . Headache   . Hx of hysterectomy   . Hyperlipidemia   . Hypertension   . Mild to Moderate Aortic Insufficiency    a. 08/2014 Echo: EF 55-60%, mild conc LVH, no rwma, Gr 1 DD, mild-mod AI, mild MR.  . Morbid obesity ([place])   . Obesity   . Peptic ulcer disease     Past Surgical History:  Procedure Laterality Date  . ABDOMINAL HYSTERECTOMY    . TOTAL KNEE ARTHROPLASTY Left 01/05/2016   Procedure: TOTAL KNEE ARTHROPLASTY;  Surgeon: Frederik Pear, MD;  Location: [facility];  Service: Orthopedics;  Laterality: Left;  . TUBAL LIGATION      There were no vitals filed for this visit.   Subjective Assessment - 01/21/21 1534    Subjective Felt sore after last session. Brace still feeling good.    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 No limitation    How long can you stand comfortably? No limitation    How long can you walk comfortably? No limitation    Patient Stated Goals wants to get strength back in arms and legs.    Currently in Pain? No/denies    Pain Onset More than a month ago                             [facility] Adult PT Treatment/Exercise - 01/21/21 0001      Knee/Hip Exercises: Aerobic   Stepper SciFit with BLE only at gear 2.0 for 5 minutes for strengthening, activity tolerance      Knee/Hip Exercises: Supine   Bridges Strengthening;AROM    Bridges Limitations x10 reps with gently pressing out into green band for hip ABD, cues for pressing up first with LLE    Straight Leg Raises Strengthening;AROM;Left;2 sets;5 reps;AAROM    Straight Leg Raises Limitations verbal and tactile cues for proper technique       Knee/Hip Exercises: Sidelying   Clams R sidelying: 2 x10 reps LLE clamshell with green tband               Balance  Exercises - 01/21/21 0001      Balance Exercises: Standing   Standing Eyes Opened Wide (BOA);Foam/compliant surface    Standing Eyes Opened Limitations on blue balance beam: 2 x 10 reps head turns, 2 x 10 reps head nods with intermittent taps to bars for balance    Stepping Strategy Anterior;Posterior;Lateral;UE support;10 reps    Stepping Strategy Limitations on blue foam beam, with single RUE/fingertip support x15 reps in each direction, tactile and verbal cues for L knee control    Marching Upper extremity assist 2;Limitations;Static;10 reps;Foam/compliant surface    Marching Limitations standing on blue mat, alternating marching, cues for slowed and controlled               PT Short Term Goals - 12/26/20 1534      PT SHORT TERM GOAL #1   Title ALL STGS = LTGS             PT Long Term Goals - 12/26/20 1548      PT LONG TERM GOAL #1   Title Pt will be independent with final  HEP in order to build upon functional gains made in therapy. ALL LTGS DUE 01/30/21    Baseline did not have time to assess on 12/26/20    Time 5   due to delay in scheduling   Period Weeks    Status On-going    Target Date 01/30/21      PT LONG TERM GOAL #2   Title Pt will improve BERG to at least a 42/56 in order to demo decr fall risk.    Baseline 38/56 on 12/26/20    Time 5    Period Weeks    Status Revised      PT LONG TERM GOAL #3   Title Pt will improve gait sped to at least 2.7 ft/sec with rollator in order to demo improved community mobility.    Baseline 1.58 ft/sec, 2.51 ft/sec with rollator    Time 5    Period Weeks    Status Revised      PT LONG TERM GOAL #4   Title Pt will decr TUG to 17 seconds or less with rollator in order to demo decr fall risk.    Baseline 19.09 seconds, 22.85 seconds with rollator    Time 5    Period Weeks    Status Revised      PT LONG TERM GOAL #5   Title Pt will decr 5x sit <> Stand to 18 seconds or less with no UE support in order to demo improved BLE strength.    Baseline 21.97 seconds    Time 5    Period Weeks    Status New                 Plan - 01/21/21 1720    Clinical Impression Statement Continued to focus on LLE>RLE strengthening and balance strategies on compliant surfaces. Pt most challenged by straight leg raises with LLE. Will continue to progress towards LTGs with D/C at end of next week.    Personal Factors and Comorbidities Time since onset of injury/illness/exacerbation;Past/Current Experience;Fitness    Comorbidities h/o Rt CVA in 2007; anxiety & depression, HTN, mild to moderate aortic insufficiency    Examination-Activity Limitations Squat;Transfers;Stand;Locomotion Level    Examination-Participation Restrictions Estate agent;Shop    Stability/Clinical Decision Making Stable/Uncomplicated    Rehab Potential Good    PT Frequency 2x / week    PT Duration 4 weeks    PT  Treatment/Interventions ADLs/Self Care Home Management;Therapeutic exercise;Therapeutic activities;Patient/family education;Passive range of motion;DME Instruction;Gait training;Functional mobility training;Stair training;Balance training;Neuromuscular re-education;Orthotic Fit/Training;Vestibular;Energy conservation    PT Next Visit Plan check goals. D/C. continue strengthening, increase step ht, sidestepping against t-band, SLS with vectors    Consulted and Agree with Plan of Care Patient           Patient will benefit from skilled therapeutic intervention in order to improve the following deficits and impairments:  Decreased strength,Decreased range of motion,Decreased activity tolerance,Abnormal gait,Decreased balance,Decreased coordination,Difficulty walking,Dizziness,Impaired sensation,Impaired tone  Visit Diagnosis: Unsteadiness on feet  Muscle weakness (generalized)  Other abnormalities of gait and mobility  Other lack of coordination     Problem List Patient Active Problem List   Diagnosis Date Noted  . 54 04/03/2020  . Nutritional counseling 01/19/2018  . Primary osteoarthritis of left knee 01/03/2016  . Mild to Moderate Aortic Insufficiency   . Hypertension   . Hyperlipidemia   . Morbid obesity ([place])   . Pulmonary hypertension ([place]) 01/30/2013  . Orthostatic hypotension 12/18/2012  . Aortic valve disorder 07/29/2010  . CHEST PAIN-PRECORDIAL 07/29/2010  . CYST OF THYROID 01/06/2010  . HYPOKALEMIA 01/06/2010  . HYPERLIPIDEMIA 12/24/2009  . OBESITY 12/24/2009  . HYPERTENSION, UNSPECIFIED 12/24/2009  . Personal history of unspecified circulatory disease 12/24/2009  . PEPTIC ULCER DISEASE, HX OF 12/24/2009    Arliss Journey, PT, DPT  01/21/2021, 5:21 PM  [facility] [address] [place], [place], [address] Phone: [phone]   Fax:  [phone]  Name: Chelsea [name] MRN: [id number] Date of  Birth: 12/11/1950

## 2021-01-26 ENCOUNTER — Other Ambulatory Visit: Payer: Self-pay

## 2021-01-26 ENCOUNTER — Encounter: Payer: Self-pay | Admitting: Physical Therapy

## 2021-01-26 ENCOUNTER — Ambulatory Visit: Payer: Medicare Other | Admitting: Physical Therapy

## 2021-01-26 DIAGNOSIS — R2681 Unsteadiness on feet: Secondary | ICD-10-CM | POA: Diagnosis not present

## 2021-01-26 DIAGNOSIS — I69354 Hemiplegia and hemiparesis following cerebral infarction affecting left non-dominant side: Secondary | ICD-10-CM | POA: Diagnosis not present

## 2021-01-26 DIAGNOSIS — M6281 Muscle weakness (generalized): Secondary | ICD-10-CM

## 2021-01-26 DIAGNOSIS — R278 Other lack of coordination: Secondary | ICD-10-CM | POA: Diagnosis not present

## 2021-01-26 DIAGNOSIS — R2689 Other abnormalities of gait and mobility: Secondary | ICD-10-CM | POA: Diagnosis not present

## 2021-01-26 DIAGNOSIS — I69854 Hemiplegia and hemiparesis following other cerebrovascular disease affecting left non-dominant side: Secondary | ICD-10-CM | POA: Diagnosis not present

## 2021-01-26 NOTE — Patient Instructions (Signed)
Access Code: [email or web address] URL: [email or web address] Date: 01/26/2021 Prepared by: Janann August  Exercises Supine Bridge with Resistance Band - 1 x daily - 5 x weekly - 2 sets - 10 reps Supine March with Resistance Band - 1 x daily - 5 x weekly - 2 sets - 10 reps Clamshell with Resistance - 1 x daily - 5 x weekly - 2 sets - 10 reps Seated Hamstring Curl with Anchored Resistance - 1 x daily - 5 x weekly - 2 sets - 10 reps Heel Toe Raises with Counter Support - 1 x daily - 5 x weekly - 2 sets - 10 reps Seated Hip Abduction with Resistance - 1 x daily - 5 x weekly - 2 sets - 10 reps Sit to Stand - 1 x daily - 5 x weekly - 2 sets - 5 reps

## 2021-01-26 NOTE — Therapy (Signed)
[facility] [address] [place], [place], [address] Phone: [phone]   Fax:  [phone]  Physical Therapy Treatment  Patient Details  Name: Chelsea [name] MRN: [id number] Date of Birth: 06-13-1951 Referring Provider (PT): Star Age, MD   Encounter Date: 01/26/2021   PT End of Session - 01/26/21 1620    Visit Number 18    Number of Visits 20    Date for PT Re-Evaluation 02/24/21   written for 30 day POC   Authorization Type UHC Medicare & Medicaid    PT Start Time 1533    PT Stop Time 1613    PT Time Calculation (min) 40 min    Activity Tolerance Patient tolerated treatment well;Patient limited by fatigue    Behavior During Therapy [facility] for tasks assessed/performed           Past Medical History:  Diagnosis Date  . Anxiety   . Cerebrovascular accident [facility])    a. 2007-->residual left sided weakness  . Chest pain   . Depression   . GERD (gastroesophageal reflux disease)   . Headache   . Hx of hysterectomy   . Hyperlipidemia   . Hypertension   . Mild to Moderate Aortic Insufficiency    a. 08/2014 Echo: EF 55-60%, mild conc LVH, no rwma, Gr 1 DD, mild-mod AI, mild MR.  . Morbid obesity ([place])   . Obesity   . Peptic ulcer disease     Past Surgical History:  Procedure Laterality Date  . ABDOMINAL HYSTERECTOMY    . TOTAL KNEE ARTHROPLASTY Left 01/05/2016   Procedure: TOTAL KNEE ARTHROPLASTY;  Surgeon: Frederik Pear, MD;  Location: [place];  Service: Orthopedics;  Laterality: Left;  . TUBAL LIGATION      There were no vitals filed for this visit.   Subjective Assessment - 01/26/21 1535    Subjective Had a busy weekend - did a lot of standing over the weekend. Back is now bothersome.    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 No limitation    How long can you stand comfortably? No limitation    How long can you walk comfortably? No limitation    Patient Stated Goals wants to get strength back in arms and legs.    Currently in Pain? No/denies   legs feeling sore   Pain Onset More than a month ago                             [facility] Adult PT Treatment/Exercise - 01/26/21 1546      Transfers   Transfers Sit to Stand    Sit to Stand 5: Supervision    Five time sit to stand comments  19.77 seconds without UE support    Stand to Sit 5: Supervision      Knee/Hip Exercises: Seated   Long Arc Quad Strengthening;1 set;10 reps;Both;Weights    Long Arc Quad Weight 3 lbs.   Lbs   Marching Strengthening;Both;2 sets;10 reps;Weights    Marching Limitations with 3# ankle weight, alternating legs      Knee/Hip Exercises: Supine   Bridges Strengthening;AROM    Bridges Limitations x10 reps with gently pressing out into green band for hip ABD, cues  for pressing up first with LLE    Straight Leg Raises Strengthening;AROM;Left;2 sets;5 reps;AAROM    Straight Leg Raises Limitations verbal and tactile cues for proper technique, assist to prevent ADD    Knee Flexion AAROM;Strengthening;Left;1 set;10 reps    Knee Flexion Limitations with red physioball, PT controlling leg out to prevent hypperextension and to keep LLE in neutral psotion               Access Code: [email or web address] URL: [email or web address] Date: 01/26/2021 Prepared by: Janann August  Verbally reviewed HEP with pt reports she has been consistently performing at home.   Exercises Supine Bridge with Resistance Band - 1 x daily - 5 x weekly - 2 sets - 10 reps Supine March with Resistance Band - 1 x daily - 5 x weekly - 2 sets - 10 reps Clamshell with Resistance - 1 x daily - 5 x weekly - 2 sets - 10 reps Seated Hamstring Curl with Anchored Resistance - 1 x daily - 5 x weekly - 2 sets - 10  reps Heel Toe Raises with Counter Support - 1 x daily - 5 x weekly - 2 sets - 10 reps Seated Hip Abduction with Resistance - 1 x daily - 5 x weekly - 2 sets - 10 reps Sit to Stand - 1 x daily - 5 x weekly - 2 sets - 5 reps     PT Education - 01/26/21 1619    Education Details reviewed HEP, D/C at next visit.    Person(s) Educated Patient    Methods Explanation    Comprehension Verbalized understanding            PT Short Term Goals - 12/26/20 1534      PT SHORT TERM GOAL #1   Title ALL STGS = LTGS             PT Long Term Goals - 01/26/21 1545      PT LONG TERM GOAL #1   Title Pt will be independent with final HEP in order to build upon functional gains made in therapy. ALL LTGS DUE 01/30/21    Baseline pt reporting consistently performing her exericse program    Time 5   due to delay in scheduling   Period Weeks    Status Achieved      PT LONG TERM GOAL #2   Title Pt will improve BERG to at least a 42/56 in order to demo decr fall risk.    Baseline 38/56 on 12/26/20    Time 5    Period Weeks    Status Revised      PT LONG TERM GOAL #3   Title Pt will improve gait sped to at least 2.7 ft/sec with rollator in order to demo improved community mobility.    Baseline 1.58 ft/sec, 2.51 ft/sec with rollator    Time 5    Period Weeks    Status Revised      PT LONG TERM GOAL #4   Title Pt will decr TUG to 17 seconds or less with rollator in order to demo decr fall risk.    Baseline 19.09 seconds, 22.85 seconds with rollator    Time 5    Period Weeks    Status Revised      PT LONG TERM GOAL #5   Title Pt will decr 5x sit <> Stand to 18 seconds or less with no UE support in order to demo improved BLE strength.    Baseline 21.97  seconds, 19.77 seconds without UE support on 01/26/21    Time 5    Period Weeks    Status Not Met              Plan - 01/26/21 1623    Clinical Impression Statement Began to check LTGs today for D/C at next visit. Pt reporting feeling  more tired and sore today due to lots of standing/walking over the weekend. Deferred some goals to check at next visit. Pt met LTG #1 - is independent with final HEP. Pt did not meet LTG #5 today, improved 5x sit <> stand time to 19.77 seconds (previously 21.97), but not to goal level. Remainder of session focused on supine LLE>RLE strengthening with pt needing verbal and manual cues at times for proper technique. Will assess remainder of LTGs next session for D/C.    Personal Factors and Comorbidities Time since onset of injury/illness/exacerbation;Past/Current Experience;Fitness    Comorbidities h/o Rt CVA in 2007; anxiety & depression, HTN, mild to moderate aortic insufficiency    Examination-Activity Limitations Squat;Transfers;Stand;Locomotion Level    Examination-Participation Restrictions Estate agent;Shop    Stability/Clinical Decision Making Stable/Uncomplicated    Rehab Potential Good    PT Frequency 2x / week    PT Duration 4 weeks    PT Treatment/Interventions ADLs/Self Care Home Management;Therapeutic exercise;Therapeutic activities;Patient/family education;Passive range of motion;DME Instruction;Gait training;Functional mobility training;Stair training;Balance training;Neuromuscular re-education;Orthotic Fit/Training;Vestibular;Energy conservation    PT Next Visit Plan check goals. D/C.    Consulted and Agree with Plan of Care Patient           Patient will benefit from skilled therapeutic intervention in order to improve the following deficits and impairments:  Decreased strength,Decreased range of motion,Decreased activity tolerance,Abnormal gait,Decreased balance,Decreased coordination,Difficulty walking,Dizziness,Impaired sensation,Impaired tone  Visit Diagnosis: Unsteadiness on feet  Muscle weakness (generalized)  Other abnormalities of gait and mobility     Problem List Patient Active Problem List   Diagnosis Date Noted  . 54 04/03/2020  .  Nutritional counseling 01/19/2018  . Primary osteoarthritis of left knee 01/03/2016  . Mild to Moderate Aortic Insufficiency   . Hypertension   . Hyperlipidemia   . Morbid obesity ([place])   . Pulmonary hypertension ([place]) 01/30/2013  . Orthostatic hypotension 12/18/2012  . Aortic valve disorder 07/29/2010  . CHEST PAIN-PRECORDIAL 07/29/2010  . CYST OF THYROID 01/06/2010  . HYPOKALEMIA 01/06/2010  . HYPERLIPIDEMIA 12/24/2009  . OBESITY 12/24/2009  . HYPERTENSION, UNSPECIFIED 12/24/2009  . Personal history of unspecified circulatory disease 12/24/2009  . PEPTIC ULCER DISEASE, HX OF 12/24/2009    Arliss Journey, PT, DPT  01/26/2021, 4:23 PM  [facility] [address] [place], [place], [address] Phone: [phone]   Fax:  [phone]  Name: Chelsea [name] MRN: [id number] Date of Birth: Jan 12, 1951

## 2021-01-28 ENCOUNTER — Ambulatory Visit: Payer: Medicare Other | Attending: Family Medicine | Admitting: Physical Therapy

## 2021-01-28 ENCOUNTER — Encounter: Payer: Self-pay | Admitting: Physical Therapy

## 2021-01-28 ENCOUNTER — Other Ambulatory Visit: Payer: Self-pay

## 2021-01-28 DIAGNOSIS — I69854 Hemiplegia and hemiparesis following other cerebrovascular disease affecting left non-dominant side: Secondary | ICD-10-CM | POA: Diagnosis not present

## 2021-01-28 DIAGNOSIS — R2681 Unsteadiness on feet: Secondary | ICD-10-CM

## 2021-01-28 DIAGNOSIS — M6281 Muscle weakness (generalized): Secondary | ICD-10-CM

## 2021-01-28 DIAGNOSIS — R2689 Other abnormalities of gait and mobility: Secondary | ICD-10-CM | POA: Insufficient documentation

## 2021-01-28 NOTE — Therapy (Signed)
[facility] [address] [place], [place], [address] Phone: [phone]   Fax:  [phone]  Physical Therapy Treatment/Discharge Summary  Patient Details  Name: Chelsea [name] MRN: [id number] Date of Birth: 11-10-1951 Referring Provider (PT): Star Age, MD   Encounter Date: 01/28/2021   PT End of Session - 01/28/21 1604    Visit Number 19    Number of Visits 20    Date for PT Re-Evaluation 02/24/21   written for 30 day POC   Authorization Type UHC Medicare & Medicaid    PT Start Time 1532    PT Stop Time 1603    PT Time Calculation (min) 31 min    Activity Tolerance Patient tolerated treatment well;Patient limited by fatigue    Behavior During Therapy [facility] for tasks assessed/performed           Past Medical History:  Diagnosis Date  . Anxiety   . Cerebrovascular accident [facility])    a. 2007-->residual left sided weakness  . Chest pain   . Depression   . GERD (gastroesophageal reflux disease)   . Headache   . Hx of hysterectomy   . Hyperlipidemia   . Hypertension   . Mild to Moderate Aortic Insufficiency    a. 08/2014 Echo: EF 55-60%, mild conc LVH, no rwma, Gr 1 DD, mild-mod AI, mild MR.  . Morbid obesity ([place])   . Obesity   . Peptic ulcer disease     Past Surgical History:  Procedure Laterality Date  . ABDOMINAL HYSTERECTOMY    . TOTAL KNEE ARTHROPLASTY Left 01/05/2016   Procedure: TOTAL KNEE ARTHROPLASTY;  Surgeon: Frederik Pear, MD;  Location: [facility];  Service: Orthopedics;  Laterality: Left;  . TUBAL LIGATION      There were no vitals filed for this visit.   Subjective Assessment - 01/28/21 1535    Subjective Did a lot of leg strengthening last note and is now feeling more sore. Thinks that she overdid it.    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 No limitation    How long can you stand comfortably? No limitation    How long can you walk comfortably? No limitation    Patient Stated Goals wants to get strength back in arms and legs.    Currently in Pain? --   legs are back feeling sore   Pain Onset More than a month ago              [facility] PT Assessment - 01/28/21 1544      Timed Up and Go Test   Normal TUG (seconds) 25.87   with rollator, pt reporting feeling more tired today                        [facility] Adult PT Treatment/Exercise - 01/28/21 0001      Ambulation/Gait   Ambulation/Gait Yes    Ambulation/Gait Assistance 5: Supervision    Assistive device Rollator    Gait Pattern Step-through pattern;Decreased step length - right;Decreased stance time - left;Decreased hip/knee flexion - left;Decreased dorsiflexion - left;Decreased weight shift to left;Left genu recurvatum;Poor foot clearance - left;Left foot flat    Ambulation Surface Level;Indoor    Gait velocity 14.56 seconds = 2.25 ft/sec  Berg Balance Test   Sit to Stand Able to stand without using hands and stabilize independently    Standing Unsupported Able to stand safely 2 minutes    Sitting with Back Unsupported but Feet Supported on Floor or Stool Able to sit safely and securely 2 minutes    Stand to Sit Sits safely with minimal use of hands    Transfers Able to transfer safely, minor use of hands    Standing Unsupported with Eyes Closed Able to stand 10 seconds safely    Standing Ubsupported with Feet Together Able to place feet together independently and stand for 1 minute with supervision    From Standing, Reach Forward with Outstretched Arm Can reach forward >12 cm safely (5")    From Standing Position, Pick up Object from [place] to pick up shoe safely and easily    From Standing Position, Turn to Look Behind Over each Shoulder Looks behind from both sides and weight  shifts well    Turn 360 Degrees Able to turn 360 degrees safely but slowly    Standing Unsupported, Alternately Place Feet on Step/Stool Needs assistance to keep from falling or unable to try    Standing Unsupported, One Foot in Front Needs help to step but can hold 15 seconds    Standing on One Leg Tries to lift leg/unable to hold 3 seconds but remains standing independently   on RLE   Total Score 42                  PT Education - 01/28/21 1600    Education Details D/C from PT, if pt notices a decr in strength/decr balance in the future - would need a new referral to return to PT. discussed not to over do it with exercises and for pt to perform what was prescribed from PT/OT    Person(s) Educated Patient    Methods Explanation    Comprehension Verbalized understanding            PT Short Term Goals - 12/26/20 1534      PT SHORT TERM GOAL #1   Title ALL STGS = LTGS             PT Long Term Goals - 01/28/21 1539      PT LONG TERM GOAL #1   Title Pt will be independent with final HEP in order to build upon functional gains made in therapy. ALL LTGS DUE 01/30/21    Baseline pt reporting consistently performing her exericse program, reviewed on 01/26/21    Time 5   due to delay in scheduling   Period Weeks    Status Achieved      PT LONG TERM GOAL #2   Title Pt will improve BERG to at least a 42/56 in order to demo decr fall risk.    Baseline 38/56 on 12/26/20, 42/56 on 01/28/21    Time 5    Period Weeks    Status Achieved      PT LONG TERM GOAL #3   Title Pt will improve gait sped to at least 2.7 ft/sec with rollator in order to demo improved community mobility.    Baseline 1.58 ft/sec, 2.51 ft/sec with rollator, 14.56 seconds = 2.25 ft/sec with rollator    Time 5    Period Weeks    Status Not Met      PT LONG TERM GOAL #4   Title Pt will decr TUG to 17 seconds or less with  rollator in order to demo decr fall risk.    Baseline 19.09 seconds, 22.85 seconds with  rollator, 25.87 seconds with rollator on 01/28/21    Time 5    Period Weeks    Status Not Met      PT LONG TERM GOAL #5   Title Pt will decr 5x sit <> Stand to 18 seconds or less with no UE support in order to demo improved BLE strength.    Baseline 21.97 seconds, 19.77 seconds without UE support on 01/26/21    Time 5    Period Weeks    Status Not Met             PHYSICAL THERAPY DISCHARGE SUMMARY  Visits from Start of Care: 19  Current functional level related to goals / functional outcomes: See LTGs   Remaining deficits: Impaired balance, decr LLE>RLE strength, gait abnormalities    Education / Equipment: HEP   Plan: Patient agrees to discharge.  Patient goals were partially met. Patient is being discharged due to being pleased with the current functional level.  ?????    *and a plateau in progress.          Plan - 01/28/21 1605    Clinical Impression Statement Checked remainder of LTGs today. Pt did not meet LTG #3 and 4 today. Pt's gait speed decr to 2.25 ft/sec with rollator (previously 2.51 ft/sec), indicating a limited community ambulator. Pt incr her TUG time to 25.87 seconds (previously 22.85 seconds). Due to pt feeling more fatigued and sore today, most likely cause of incr in times for these measures. Pt did meet goal in regards to BERG, improved to 42/56 (previously 38/56). Pt in agreement to D/C from PT at this time and to continue with HEP at home.    Personal Factors and Comorbidities Time since onset of injury/illness/exacerbation;Past/Current Experience;Fitness    Comorbidities h/o Rt CVA in 2007; anxiety & depression, HTN, mild to moderate aortic insufficiency    Examination-Activity Limitations Squat;Transfers;Stand;Locomotion Level    Examination-Participation Restrictions Estate agent;Shop    Stability/Clinical Decision Making Stable/Uncomplicated    Rehab Potential Good    PT Frequency 2x / week    PT Duration 4 weeks    PT  Treatment/Interventions ADLs/Self Care Home Management;Therapeutic exercise;Therapeutic activities;Patient/family education;Passive range of motion;DME Instruction;Gait training;Functional mobility training;Stair training;Balance training;Neuromuscular re-education;Orthotic Fit/Training;Vestibular;Energy conservation    PT Next Visit Plan D/C    Consulted and Agree with Plan of Care Patient           Patient will benefit from skilled therapeutic intervention in order to improve the following deficits and impairments:  Decreased strength,Decreased range of motion,Decreased activity tolerance,Abnormal gait,Decreased balance,Decreased coordination,Difficulty walking,Dizziness,Impaired sensation,Impaired tone  Visit Diagnosis: Unsteadiness on feet  Muscle weakness (generalized)  Other abnormalities of gait and mobility  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 54 04/03/2020  . Nutritional counseling 01/19/2018  . Primary osteoarthritis of left knee 01/03/2016  . Mild to Moderate Aortic Insufficiency   . Hypertension   . Hyperlipidemia   . Morbid obesity ([name])   . Pulmonary hypertension ([place]) 01/30/2013  . Orthostatic hypotension 12/18/2012  . Aortic valve disorder 07/29/2010  . CHEST PAIN-PRECORDIAL 07/29/2010  . CYST OF THYROID 01/06/2010  . HYPOKALEMIA 01/06/2010  . HYPERLIPIDEMIA 12/24/2009  . OBESITY 12/24/2009  . HYPERTENSION, UNSPECIFIED 12/24/2009  . Personal history of unspecified circulatory disease 12/24/2009  .  PEPTIC ULCER DISEASE, HX OF 12/24/2009    Arliss Journey, PT, DPT  01/28/2021, 4:08 PM  [facility] [address] [place], [place], [address] Phone: [phone]   Fax:  [phone]  Name: Chelsea [name] MRN: [id number] Date of Birth: 13-Aug-1951

## 2021-02-20 DIAGNOSIS — I10 Essential (primary) hypertension: Secondary | ICD-10-CM | POA: Diagnosis not present

## 2021-02-20 DIAGNOSIS — K219 Gastro-esophageal reflux disease without esophagitis: Secondary | ICD-10-CM | POA: Diagnosis not present

## 2021-02-20 DIAGNOSIS — R3915 Urgency of urination: Secondary | ICD-10-CM | POA: Diagnosis not present

## 2021-02-20 DIAGNOSIS — R600 Localized edema: Secondary | ICD-10-CM | POA: Diagnosis not present

## 2021-02-20 DIAGNOSIS — E782 Mixed hyperlipidemia: Secondary | ICD-10-CM | POA: Diagnosis not present

## 2021-02-20 DIAGNOSIS — Z0000 Encounter for general adult medical examination without abnormal findings: Secondary | ICD-10-CM | POA: Diagnosis not present

## 2021-02-20 DIAGNOSIS — M62838 Other muscle spasm: Secondary | ICD-10-CM | POA: Diagnosis not present

## 2021-03-18 ENCOUNTER — Other Ambulatory Visit: Payer: Self-pay | Admitting: Interventional Cardiology

## 2021-03-18 DIAGNOSIS — Z720 Tobacco use: Secondary | ICD-10-CM

## 2021-04-01 ENCOUNTER — Ambulatory Visit: Payer: Self-pay | Admitting: Surgery

## 2021-04-01 DIAGNOSIS — R229 Localized swelling, mass and lump, unspecified: Secondary | ICD-10-CM | POA: Diagnosis not present

## 2021-04-01 NOTE — H&P (Signed)
Surgical Evaluation  Chief Complaint: subcutaneous mass  HPI: Very pleasant 70 year old woman with complaint of masses on her upper back.  There is one on the right side which has been present for at least a year, slowly increasing in size.  This causes pain and neck ache.  She has also recently developed a similar lesion on the left side of her upper back.  These are present overlying the trapezius area.    Allergies  Allergen Reactions   Other Swelling    Hair dye, caused eye swelling, multiple times experienced this reaction   Hydrocodone-Acetaminophen Nausea Only   Oxycodone Nausea And Vomiting    Past Medical History:  Diagnosis Date   Anxiety    Cerebrovascular accident [facility])    a. 2007-->residual left sided weakness   Chest pain    Depression    GERD (gastroesophageal reflux disease)    Headache    Hx of hysterectomy    Hyperlipidemia    Hypertension    Mild to Moderate Aortic Insufficiency    a. 08/2014 Echo: EF 55-60%, mild conc LVH, no rwma, Gr 1 DD, mild-mod AI, mild MR.   Morbid obesity ([place])    Obesity    Peptic ulcer disease     Past Surgical History:  Procedure Laterality Date   ABDOMINAL HYSTERECTOMY     TOTAL KNEE ARTHROPLASTY Left 01/05/2016   Procedure: TOTAL KNEE ARTHROPLASTY;  Surgeon: Frederik Pear, MD;  Location: [facility];  Service: Orthopedics;  Laterality: Left;   TUBAL LIGATION      Family History  Problem Relation Age of Onset   Hypertension Mother        deceased   Peripheral vascular disease Mother    Cirrhosis Father        DeCEASED   Heart attack Other        grandmother deceased   Cancer Brother    Drug abuse Brother        overdose, in 1 brother   Breast cancer Neg Hx     Social History   Socioeconomic History   Marital status: Married    Spouse name: Not on file   Number of children: 3   Years of education: 11   Highest education level: Not on file  Occupational History   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  Tobacco Use   Smoking status: Current Every Day Smoker   Smokeless tobacco: Never Used  Substance and Sexual Activity   Alcohol use: No    Alcohol/week: 0.0 standard drinks   Drug use: No   Sexual activity: Not Currently  Other Topics Concern   Not on file  Social History Narrative   Patient consumes no caffeine   Social Determinants of Radio broadcast assistant Strain: Not on file  Food Insecurity: Not on file  Transportation Needs: Not on file  Physical Activity: Not on file  Stress: Not on file  Social Connections: Not on file    Current Outpatient Medications on File Prior to Visit  Medication Sig Dispense Refill   amLODipine-valsartan (EXFORGE) 10-320 MG tablet Take 1 tablet by mouth daily.     aspirin EC 81 MG tablet Take 1 tablet (81 mg total) by mouth daily.     atorvastatin (LIPITOR) 10 MG tablet Take 1 tablet (10 mg total) by mouth daily. 90 tablet 3   chlorhexidine (PERIDEX) 0.12 % solution 15 mLs 2 (two) times daily.  citalopram (CELEXA) 10 MG tablet Take 20 mg by mouth daily.      cyanocobalamin 100 MCG tablet Take 100 mcg by mouth daily.      cyclobenzaprine (FLEXERIL) 10 MG tablet Take 10 mg by mouth 3 (three) times daily as needed for muscle spasms.     doxazosin (CARDURA) 1 MG tablet Take 1 tablet (1 mg total) by mouth daily. 90 tablet 2   ezetimibe (ZETIA) 10 MG tablet Take 10 mg by mouth daily.     furosemide (LASIX) 20 MG tablet Take 20 mg by mouth as needed.      gabapentin (NEURONTIN) 800 MG tablet Take 1 tablet (800 mg total) by mouth 2 (two) times daily. 180 tablet 3   indapamide (LOZOL) 2.5 MG tablet Take 1 tablet (2.5 mg total) by mouth daily. 30 tablet 11   meclizine (ANTIVERT) 25 MG tablet Take 25 mg by mouth 3 (three) times daily as needed.     methocarbamol (ROBAXIN) 500 MG tablet Take 500-1,000 mg by mouth 3 (three) times daily as needed.     metoprolol succinate (TOPROL-XL) 100 MG 24 hr tablet Take 1  tablet (100 mg total) by mouth daily. Take with or immediately following a meal. 30 tablet 11   mirabegron ER (MYRBETRIQ) 25 MG TB24 tablet Take 25 mg by mouth daily.     Multiple Vitamin (MULTIVITAMIN) tablet Take 1 tablet by mouth daily.       nicotine (NICODERM CQ - DOSED IN MG/24 HOURS) 21 mg/24hr patch PLACE 1 PATCH (21 MG TOTAL) ONTO THE SKIN DAILY. 28 patch 2   nitroGLYCERIN (NITROSTAT) 0.4 MG SL tablet Place 1 tablet (0.4 mg total) under the tongue every 5 (five) minutes as needed for chest pain. 25 tablet 3   pantoprazole (PROTONIX) 40 MG tablet Take 40 mg by mouth 2 (two) times daily.      spironolactone (ALDACTONE) 25 MG tablet Take 1 tablet (25 mg total) by mouth daily. 90 tablet 2   tiZANidine (ZANAFLEX) 4 MG tablet Take 8 mg by mouth 3 (three) times daily as needed.      traMADol (ULTRAM) 50 MG tablet Take 50 mg by mouth every 8 (eight) hours as needed.     No current facility-administered medications on file prior to visit.    Review of Systems: a complete, 10pt review of systems was completed with pertinent positives and negatives as documented in the HPI  Physical Exam: Weight: 185 lb   Height: 66 in  Body Surface Area: 1.93 m   Body Mass Index: 29.86 kg/m   Temp.: 95.3 F    Pulse: 85 (Regular)    P.OX: 96% (Room air) BP: 160/78(Sitting, Left Arm, Standard) On the left trapezius there is a mobile subcutaneous mass about 2.5 cm in diameter, mildly tender. On the right side there is similarly located lesion approximately 7 cm in diameter.   CBC Latest Ref Rng & Units 08/09/2016 08/09/2016 01/08/2016  WBC 4.0 - 10.5 K/uL - 4.0 8.5  Hemoglobin 12.0 - 15.0 g/dL 14.6 13.6 10.0(L)  Hematocrit 36.0 - 46.0 % 43.0 43.0 31.0(L)  Platelets 150 - 400 K/uL - 237 232    CMP Latest Ref Rng & Units 10/22/2020 10/07/2020 09/23/2020  Glucose 65 - 99 mg/dL 90 92 82  BUN 8 - 27 mg/dL 16 19 15   Creatinine 0.57 - 1.00 mg/dL 1.47(H) 1.35(H) 1.15(H)  Sodium 134 - 144 mmol/L 144 142 143   Potassium 3.5 - 5.2 mmol/L 4.0 3.8 4.2  Chloride 96 - 106 mmol/L 103 98 102  CO2 20 - 29 mmol/L 27 28 26   Calcium 8.7 - 10.3 mg/dL 9.6 9.7 9.9  Total Protein 6.0 - 8.5 g/dL - - -  Total Bilirubin 0.0 - 1.2 mg/dL - - -  Alkaline Phos 44 - 121 IU/L - - -  AST 0 - 40 IU/L - - -  ALT 0 - 32 IU/L - - -    Lab Results  Component Value Date   INR 1.04 12/25/2015   INR 1.0 ratio 08/05/2010    Imaging: No results found.   A/P: SUBCUTANEOUS MASS (R22.9) Story: 2 masses, one on each side of her upper back. These are symptomatic with pain increasing in size. She wishes to have this removed. We discussed the procedure and risks of bleeding, infection, pain, scarring, hematoma/seroma, wound healing problems, recurrence of the lesions. Questions local and answered. We will schedule at her convenience.    Patient Active Problem List   Diagnosis Date Noted   35 04/03/2020   Nutritional counseling 01/19/2018   Primary osteoarthritis of left knee 01/03/2016   Mild to Moderate Aortic Insufficiency    Hypertension    Hyperlipidemia    Morbid obesity ([place])    Pulmonary hypertension ([place]) 01/30/2013   Orthostatic hypotension 12/18/2012   Aortic valve disorder 07/29/2010   CHEST PAIN-PRECORDIAL 07/29/2010   CYST OF THYROID 01/06/2010   HYPOKALEMIA 01/06/2010   HYPERLIPIDEMIA 12/24/2009   OBESITY 12/24/2009   HYPERTENSION, UNSPECIFIED 12/24/2009   Personal history of unspecified circulatory disease 12/24/2009   PEPTIC ULCER DISEASE, HX OF 12/24/2009       Romana Juniper, MD [facility]  See Shea Evans to contact appropriate on-call provider

## 2021-04-14 DIAGNOSIS — M4316 Spondylolisthesis, lumbar region: Secondary | ICD-10-CM | POA: Diagnosis not present

## 2021-04-24 DIAGNOSIS — R350 Frequency of micturition: Secondary | ICD-10-CM | POA: Diagnosis not present

## 2021-04-26 ENCOUNTER — Other Ambulatory Visit: Payer: Self-pay | Admitting: Adult Health

## 2021-05-11 ENCOUNTER — Encounter (HOSPITAL_BASED_OUTPATIENT_CLINIC_OR_DEPARTMENT_OTHER): Payer: Self-pay | Admitting: Surgery

## 2021-05-12 ENCOUNTER — Encounter (HOSPITAL_BASED_OUTPATIENT_CLINIC_OR_DEPARTMENT_OTHER): Payer: Self-pay | Admitting: Surgery

## 2021-05-12 ENCOUNTER — Other Ambulatory Visit (HOSPITAL_COMMUNITY): Payer: Medicare Other

## 2021-05-12 ENCOUNTER — Other Ambulatory Visit: Payer: Self-pay

## 2021-05-12 NOTE — Progress Notes (Addendum)
ADDENDUM:  Called and spoke w/ pt via phone stated she did receive call from Dr Kae Heller office and told to continue ASA did not need to stop taking prior to surgery. Pt verbalized understanding to no take morning of surgery.   ADDENDUM:  called Dr Kae Heller office and spoke w/ triage nurse, whom stated their is no documentation in pt's record about instructions for ASA prior to surgery, stated she will sent message to Dr Kae Heller and pt would be called with instructions.   Spoke w/ via phone for pre-op interview--- Pt Lab needs dos----  Istat             Lab results------ current ekg in epic/ chart COVID test -----patient states asymptomatic no test needed  Arrive at -------  0745 on 05-15-2021 NPO after MN NO Solid Food.  Clear liquids from MN until--- 0630 Med rec completed Medications to take morning of surgery ----- Gabapentin, Celexa, Zetia, Cardura, Toprol, Protonix, Mybetriq, Flexeril Diabetic medication ----- n/a  Patient instructed no nail polish to be worn day of surgery Patient instructed to bring photo id and insurance card day of surgery Patient aware to have Driver (ride )---- pt verbalized understanding ok to be dropped off by uber but not picked up without someone with her at discharge,  pt have name/phone of pck up drive at check in dos/ caregiver  for 24 hours after surgery ---  daughter, Quenten Raven  Patient Special Instructions ----- n/a Pre-Op special Istructions ----- n/a Patient verbalized understanding of instructions that were given at this phone interview. Patient denies shortness of breath, chest pain, fever, cough at this phone interview.    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  PCP:  Raelyn Number PA Cardiologist : Dr Linard Millers ([facility] 08-08-2020 epic) HTN Clinic:   [facility] 10-22-2020 Neurologist:   Dr Rexene Alberts  ([facility] 08-11-2020 epic)  Chest x-ray : CTA 02-04-2020 EKG : 08-08-2020 Echo : 07-22-2020 Stress test:  12-29-2009 Cardiac Cath : 08-07-2010 Activity level:  only sometimes sob w/ stairs Sleep Study/ CPAP : NO  Blood Thinner/ Instructions Maryjane Hurter Dose:  NO ASA / Instructions/ Last Dose :  ASA 81mg  Per pt was not given any instructions by Dr Windle Guard about stopping prior to surgery

## 2021-05-14 NOTE — Progress Notes (Signed)
Dr. Ron Parker office called, Abigail Butts in triage, notified about correcting abbreviations in consent order.

## 2021-05-15 ENCOUNTER — Encounter (HOSPITAL_BASED_OUTPATIENT_CLINIC_OR_DEPARTMENT_OTHER): Payer: Self-pay | Admitting: Anesthesiology

## 2021-05-15 ENCOUNTER — Ambulatory Visit (HOSPITAL_BASED_OUTPATIENT_CLINIC_OR_DEPARTMENT_OTHER)
Admission: RE | Admit: 2021-05-15 | Discharge: 2021-05-15 | Disposition: A | Discharge status: Home / Self Care | Payer: Medicare Other | Attending: Surgery | Admitting: Surgery

## 2021-05-15 ENCOUNTER — Encounter (HOSPITAL_BASED_OUTPATIENT_CLINIC_OR_DEPARTMENT_OTHER): Payer: Self-pay | Admitting: Surgery

## 2021-05-15 ENCOUNTER — Encounter (HOSPITAL_BASED_OUTPATIENT_CLINIC_OR_DEPARTMENT_OTHER)
Admission: RE | Disposition: A | Discharge status: Home / Self Care | Payer: Self-pay | Source: Home / Self Care | Attending: Surgery

## 2021-05-15 HISTORY — DX: Hemiplegia and hemiparesis following cerebral infarction affecting left non-dominant side: I69.354

## 2021-05-15 HISTORY — DX: Chronic kidney disease, stage 2 (mild): N18.2

## 2021-05-15 HISTORY — DX: Nontoxic multinodular goiter: E04.2

## 2021-05-15 HISTORY — DX: Presence of external hearing-aid: Z97.4

## 2021-05-15 HISTORY — DX: Benign paroxysmal vertigo, unspecified ear: H81.10

## 2021-05-15 HISTORY — DX: Diaphragmatic hernia without obstruction or gangrene: K44.9

## 2021-05-15 HISTORY — DX: Other chronic pain: G89.29

## 2021-05-15 HISTORY — DX: Presence of spectacles and contact lenses: Z97.3

## 2021-05-15 HISTORY — DX: Aortic aneurysm of unspecified site, without rupture: I71.9

## 2021-05-15 HISTORY — DX: Overactive bladder: N32.81

## 2021-05-15 HISTORY — DX: Nonrheumatic aortic (valve) insufficiency: I35.1

## 2021-05-15 HISTORY — DX: Localized swelling, mass and lump, trunk: R22.2

## 2021-05-15 HISTORY — DX: Presence of dental prosthetic device (complete) (partial): Z97.2

## 2021-05-15 HISTORY — DX: Central pain syndrome: G89.0

## 2021-05-15 HISTORY — DX: Personal history of diseases of the blood and blood-forming organs and certain disorders involving the immune mechanism: Z86.2

## 2021-05-15 HISTORY — DX: Spondylolisthesis, lumbar region: M43.16

## 2021-05-15 HISTORY — DX: Urge incontinence: N39.41

## 2021-05-15 SURGERY — EXCISION MASS
Anesthesia: Choice

## 2021-05-15 MED ORDER — CEFAZOLIN SODIUM-DEXTROSE 2-4 GM/100ML-% IV SOLN
INTRAVENOUS | Status: AC
  Filled 2021-05-15: qty 100

## 2021-05-15 MED ORDER — CHLORHEXIDINE GLUCONATE 4 % EX LIQD: 60.0000 mL | Freq: Once | CUTANEOUS | Status: DC

## 2021-05-15 MED ORDER — LIDOCAINE HCL (PF) 2 % IJ SOLN
INTRAMUSCULAR | Status: AC
  Filled 2021-05-15: qty 5

## 2021-05-15 MED ORDER — LACTATED RINGERS IV SOLN: INTRAVENOUS | Status: DC

## 2021-05-15 MED ORDER — MIDAZOLAM HCL 2 MG/2ML IJ SOLN
INTRAMUSCULAR | Status: AC
  Filled 2021-05-15: qty 2

## 2021-05-15 MED ORDER — CEFAZOLIN SODIUM-DEXTROSE 2-4 GM/100ML-% IV SOLN: 2.0000 g | INTRAVENOUS | Status: DC

## 2021-05-15 MED ORDER — ACETAMINOPHEN 500 MG PO TABS
ORAL_TABLET | ORAL | Status: AC
  Filled 2021-05-15: qty 2

## 2021-05-15 MED ORDER — PROPOFOL 10 MG/ML IV BOLUS
INTRAVENOUS | Status: AC
  Filled 2021-05-15: qty 20

## 2021-05-15 MED ORDER — FENTANYL CITRATE (PF) 100 MCG/2ML IJ SOLN
INTRAMUSCULAR | Status: AC
  Filled 2021-05-15: qty 2

## 2021-05-15 MED ORDER — ONDANSETRON HCL 4 MG/2ML IJ SOLN
INTRAMUSCULAR | Status: AC
  Filled 2021-05-15: qty 2

## 2021-05-15 MED ORDER — KETOROLAC TROMETHAMINE 30 MG/ML IJ SOLN
INTRAMUSCULAR | Status: AC
  Filled 2021-05-15: qty 1

## 2021-05-15 MED ORDER — ACETAMINOPHEN 500 MG PO TABS: 1000.0000 mg | ORAL_TABLET | ORAL | Status: DC

## 2021-05-15 SURGICAL SUPPLY — 45 items
ADH SKN CLS APL DERMABOND .7 (GAUZE/BANDAGES/DRESSINGS)
APL PRP STRL LF DISP 70% ISPRP (MISCELLANEOUS)
BLADE SURG 15 STRL LF DISP TIS (BLADE) ×1 IMPLANT
BLADE SURG 15 STRL SS (BLADE)
BRIEF STRETCH FOR OB PAD LRG (UNDERPADS AND DIAPERS) IMPLANT
CHLORAPREP W/TINT 26 (MISCELLANEOUS) IMPLANT
COVER BACK TABLE 60X90IN (DRAPES) ×1 IMPLANT
COVER MAYO STAND STRL (DRAPES) ×1 IMPLANT
COVER WAND RF STERILE (DRAPES) ×1 IMPLANT
DERMABOND ADVANCED (GAUZE/BANDAGES/DRESSINGS)
DERMABOND ADVANCED .7 DNX12 (GAUZE/BANDAGES/DRESSINGS) IMPLANT
DRAPE LAPAROTOMY 100X72 PEDS (DRAPES) IMPLANT
DRAPE LAPAROTOMY TRNSV 102X78 (DRAPES) IMPLANT
DRAPE UTILITY XL STRL (DRAPES) ×1 IMPLANT
DRSG PAD ABDOMINAL 8X10 ST (GAUZE/BANDAGES/DRESSINGS) IMPLANT
ELECT REM PT RETURN 9FT ADLT (ELECTROSURGICAL)
ELECTRODE REM PT RTRN 9FT ADLT (ELECTROSURGICAL) ×1 IMPLANT
GAUZE SPONGE 4X4 12PLY STRL (GAUZE/BANDAGES/DRESSINGS) ×1 IMPLANT
GLOVE SURG ENC MOIS LTX SZ6 (GLOVE) ×1 IMPLANT
GLOVE SURG UNDER LTX SZ6.5 (GLOVE) ×1 IMPLANT
GOWN STRL REUS W/TWL LRG LVL3 (GOWN DISPOSABLE) ×1 IMPLANT
KIT SIGMOIDOSCOPE (SET/KITS/TRAYS/PACK) IMPLANT
KIT TURNOVER CYSTO (KITS) ×1 IMPLANT
NDL HYPO 25X1 1.5 SAFETY (NEEDLE) IMPLANT
NEEDLE HYPO 22GX1.5 SAFETY (NEEDLE) IMPLANT
NEEDLE HYPO 25X1 1.5 SAFETY (NEEDLE) IMPLANT
PACK BASIN DAY SURGERY FS (CUSTOM PROCEDURE TRAY) ×1 IMPLANT
PENCIL SMOKE EVACUATOR (MISCELLANEOUS) ×1 IMPLANT
SPONGE LAP 18X18 RF (DISPOSABLE) IMPLANT
SPONGE LAP 4X18 RFD (DISPOSABLE) IMPLANT
STAPLER VISISTAT 35W (STAPLE) IMPLANT
SUCTION FRAZIER HANDLE 10FR (MISCELLANEOUS)
SUCTION TUBE FRAZIER 10FR DISP (MISCELLANEOUS) IMPLANT
SUT CHROMIC 3 0 SH 27 (SUTURE) IMPLANT
SUT MNCRL AB 4-0 PS2 18 (SUTURE) ×1 IMPLANT
SUT VIC AB 2-0 SH 27 (SUTURE)
SUT VIC AB 2-0 SH 27XBRD (SUTURE) IMPLANT
SUT VIC AB 3-0 SH 27 (SUTURE)
SUT VIC AB 3-0 SH 27X BRD (SUTURE) ×1 IMPLANT
SYR BULB IRRIG 60ML STRL (SYRINGE) ×1 IMPLANT
SYR CONTROL 10ML LL (SYRINGE) ×1 IMPLANT
TOWEL OR 17X26 10 PK STRL BLUE (TOWEL DISPOSABLE) ×1 IMPLANT
TRAY DSU PREP LF (CUSTOM PROCEDURE TRAY) IMPLANT
TUBE CONNECTING 12X1/4 (SUCTIONS) ×1 IMPLANT
YANKAUER SUCT BULB TIP NO VENT (SUCTIONS) ×1 IMPLANT

## 2021-05-15 NOTE — Anesthesia Preprocedure Evaluation (Deleted)
Anesthesia Evaluation    Airway        Dental   Pulmonary Current Smoker,           Cardiovascular hypertension,      Neuro/Psych    GI/Hepatic   Endo/Other    Renal/GU      Musculoskeletal   Abdominal   Peds  Hematology   Anesthesia Other Findings   Reproductive/Obstetrics                             Anesthesia Physical Anesthesia Plan Anesthesia Quick Evaluation  

## 2021-05-15 NOTE — Progress Notes (Signed)
Pt surgery canceled d/t elevated BP. MD made aware.  Pt will call the office to reschedule.

## 2021-05-19 ENCOUNTER — Ambulatory Visit: Payer: Self-pay | Admitting: Surgery

## 2021-05-19 NOTE — H&P (Signed)
Surgical Evaluation   Chief Complaint: subcutaneous mass   HPI: Very pleasant 70 year old woman with complaint of masses on her upper back.  There is one on the right side which has been present for at least a year, slowly increasing in size.  This causes pain and neck ache.  She has also recently developed a similar lesion on the left side of her upper back.  These are present overlying the trapezius area.          Allergies  Allergen Reactions   Other Swelling      Hair dye, caused eye swelling, multiple times experienced this reaction   Hydrocodone-Acetaminophen Nausea Only   Oxycodone Nausea And Vomiting          Past Medical History:  Diagnosis Date   Anxiety     Cerebrovascular accident [facility])      a. 2007-->residual left sided weakness   Chest pain     Depression     GERD (gastroesophageal reflux disease)     Headache     Hx of hysterectomy     Hyperlipidemia     Hypertension     Mild to Moderate Aortic Insufficiency      a. 08/2014 Echo: EF 55-60%, mild conc LVH, no rwma, Gr 1 DD, mild-mod AI, mild MR.   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/05/2016    Procedure: TOTAL KNEE ARTHROPLASTY;  Surgeon: Frederik Pear, MD;  Location: [facility];  Service: Orthopedics;  Laterality: Left;   TUBAL LIGATION               Family History  Problem Relation Age of Onset   Hypertension Mother          deceased   Peripheral vascular disease Mother     Cirrhosis Father          DeCEASED   Heart attack Other          grandmother deceased   Cancer Brother     Drug abuse Brother          overdose, in 1 brother   Breast cancer Neg Hx        Social History         Socioeconomic History   Marital status: Married      Spouse name: Not on file   Number of children: 3   Years of education: 11   Highest education level: Not on file   Occupational History   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  Tobacco Use   Smoking status: Current Every Day Smoker   Smokeless tobacco: Never Used  Substance and Sexual Activity   Alcohol use: No      Alcohol/week: 0.0 standard drinks   Drug use: No   Sexual activity: Not Currently  Other Topics Concern   Not on file  Social History Narrative    Patient consumes no caffeine    Social Determinants of Health    Financial Resource Strain: Not on file  Food Insecurity: Not on file  Transportation Needs: Not on file  Physical Activity: Not on file  Stress: Not on file  Social Connections: Not on file  Current Outpatient Medications on File Prior to Visit  Medication Sig Dispense Refill   amLODipine-valsartan (EXFORGE) 10-320 MG tablet Take 1 tablet by mouth daily.       aspirin EC 81 MG tablet Take 1 tablet (81 mg total) by mouth daily.       atorvastatin (LIPITOR) 10 MG tablet Take 1 tablet (10 mg total) by mouth daily. 90 tablet 3   chlorhexidine (PERIDEX) 0.12 % solution 15 mLs 2 (two) times daily.       citalopram (CELEXA) 10 MG tablet Take 20 mg by mouth daily.       cyanocobalamin 100 MCG tablet Take 100 mcg by mouth daily.       cyclobenzaprine (FLEXERIL) 10 MG tablet Take 10 mg by mouth 3 (three) times daily as needed for muscle spasms.       doxazosin (CARDURA) 1 MG tablet Take 1 tablet (1 mg total) by mouth daily. 90 tablet 2   ezetimibe (ZETIA) 10 MG tablet Take 10 mg by mouth daily.       furosemide (LASIX) 20 MG tablet Take 20 mg by mouth as needed.       gabapentin (NEURONTIN) 800 MG tablet Take 1 tablet (800 mg total) by mouth 2 (two) times daily. 180 tablet 3   indapamide (LOZOL) 2.5 MG tablet Take 1 tablet (2.5 mg total) by mouth daily. 30 tablet 11   meclizine (ANTIVERT) 25 MG tablet Take 25 mg by mouth 3 (three) times daily as needed.       methocarbamol (ROBAXIN) 500 MG tablet Take 500-1,000  mg by mouth 3 (three) times daily as needed.       metoprolol succinate (TOPROL-XL) 100 MG 24 hr tablet Take 1 tablet (100 mg total) by mouth daily. Take with or immediately following a meal. 30 tablet 11   mirabegron ER (MYRBETRIQ) 25 MG TB24 tablet Take 25 mg by mouth daily.       Multiple Vitamin (MULTIVITAMIN) tablet Take 1 tablet by mouth daily.         nicotine (NICODERM CQ - DOSED IN MG/24 HOURS) 21 mg/24hr patch PLACE 1 PATCH (21 MG TOTAL) ONTO THE SKIN DAILY. 28 patch 2   nitroGLYCERIN (NITROSTAT) 0.4 MG SL tablet Place 1 tablet (0.4 mg total) under the tongue every 5 (five) minutes as needed for chest pain. 25 tablet 3   pantoprazole (PROTONIX) 40 MG tablet Take 40 mg by mouth 2 (two) times daily.       spironolactone (ALDACTONE) 25 MG tablet Take 1 tablet (25 mg total) by mouth daily. 90 tablet 2   tiZANidine (ZANAFLEX) 4 MG tablet Take 8 mg by mouth 3 (three) times daily as needed.       traMADol (ULTRAM) 50 MG tablet Take 50 mg by mouth every 8 (eight) hours as needed.        No current facility-administered medications on file prior to visit.      Review of Systems: a complete, 10pt review of systems was completed with pertinent positives and negatives as documented in the HPI   Physical Exam: Weight: 185 lb   Height: 66 in  Body Surface Area: 1.93 m   Body Mass Index: 29.86 kg/m   Temp.: 95.3 F    Pulse: 85 (Regular)    P.OX: 96% (Room air) BP: 160/78(Sitting, Left Arm, Standard) On the left trapezius there is a mobile subcutaneous mass about 2.5 cm in diameter, mildly tender. On the right side there is similarly located lesion  approximately 7 cm in diameter.     CBC Latest Ref Rng & Units 08/09/2016 08/09/2016 01/08/2016  WBC 4.0 - 10.5 K/uL - 4.0 8.5  Hemoglobin 12.0 - 15.0 g/dL 14.6 13.6 10.0(L)  Hematocrit 36.0 - 46.0 % 43.0 43.0 31.0(L)  Platelets 150 - 400 K/uL - 237 232      CMP Latest Ref Rng & Units 10/22/2020 10/07/2020 09/23/2020  Glucose 65 - 99 mg/dL 90 92 82   BUN 8 - 27 mg/dL 16 19 15   Creatinine 0.57 - 1.00 mg/dL 1.47(H) 1.35(H) 1.15(H)  Sodium 134 - 144 mmol/L 144 142 143  Potassium 3.5 - 5.2 mmol/L 4.0 3.8 4.2  Chloride 96 - 106 mmol/L 103 98 102  CO2 20 - 29 mmol/L 27 28 26   Calcium 8.7 - 10.3 mg/dL 9.6 9.7 9.9  Total Protein 6.0 - 8.5 g/dL - - -  Total Bilirubin 0.0 - 1.2 mg/dL - - -  Alkaline Phos 44 - 121 IU/L - - -  AST 0 - 40 IU/L - - -  ALT 0 - 32 IU/L - - -      Recent Labs       Lab Results  Component Value Date    INR 1.04 12/25/2015    INR 1.0 ratio 08/05/2010        Imaging: Imaging Results (Last 48 hours)  No results found.       A/P: SUBCUTANEOUS MASS (R22.9) Story: 2 masses, one on each side of her upper back. These are symptomatic with pain increasing in size. She wishes to have this removed. We discussed the procedure and risks of bleeding, infection, pain, scarring, hematoma/seroma, wound healing problems, recurrence of the lesions. Questions local and answered. We will schedule at her convenience.  Case was canceled day of surgery 6/17 by anesthesia due to blood pressure concerns. Rescheduled for 6/28.           Patient Active Problem List    Diagnosis Date Noted   74 04/03/2020   Nutritional counseling 01/19/2018   Primary osteoarthritis of left knee 01/03/2016   Mild to Moderate Aortic Insufficiency     Hypertension     Hyperlipidemia     Morbid obesity ([place])     Pulmonary hypertension ([place]) 01/30/2013   Orthostatic hypotension 12/18/2012   Aortic valve disorder 07/29/2010   CHEST PAIN-PRECORDIAL 07/29/2010   CYST OF THYROID 01/06/2010   HYPOKALEMIA 01/06/2010   HYPERLIPIDEMIA 12/24/2009   OBESITY 12/24/2009   HYPERTENSION, UNSPECIFIED 12/24/2009   Personal history of unspecified circulatory disease 12/24/2009   PEPTIC ULCER DISEASE, HX OF 12/24/2009          Romana Juniper, MD [facility]

## 2021-05-19 NOTE — H&P (View-Only) (Signed)
Surgical Evaluation   Chief Complaint: subcutaneous mass   HPI: Very pleasant 70 year old woman with complaint of masses on her upper back.  There is one on the right side which has been present for at least a year, slowly increasing in size.  This causes pain and neck ache.  She has also recently developed a similar lesion on the left side of her upper back.  These are present overlying the trapezius area.          Allergies  Allergen Reactions   Other Swelling      Hair dye, caused eye swelling, multiple times experienced this reaction   Hydrocodone-Acetaminophen Nausea Only   Oxycodone Nausea And Vomiting          Past Medical History:  Diagnosis Date   Anxiety     Cerebrovascular accident [facility])      a. 2007-->residual left sided weakness   Chest pain     Depression     GERD (gastroesophageal reflux disease)     Headache     Hx of hysterectomy     Hyperlipidemia     Hypertension     Mild to Moderate Aortic Insufficiency      a. 08/2014 Echo: EF 55-60%, mild conc LVH, no rwma, Gr 1 DD, mild-mod AI, mild MR.   Morbid obesity ([place])     Obesity     Peptic ulcer disease             Past Surgical History:  Procedure Laterality Date   ABDOMINAL HYSTERECTOMY       TOTAL KNEE ARTHROPLASTY Left 01/05/2016    Procedure: TOTAL KNEE ARTHROPLASTY;  Surgeon: Frederik Pear, MD;  Location: [place];  Service: Orthopedics;  Laterality: Left;   TUBAL LIGATION               Family History  Problem Relation Age of Onset   Hypertension Mother          deceased   Peripheral vascular disease Mother     Cirrhosis Father          DeCEASED   Heart attack Other          grandmother deceased   Cancer Brother     Drug abuse Brother          overdose, in 1 brother   Breast cancer Neg Hx        Social History         Socioeconomic History   Marital status: Married      Spouse name: Not on file   Number of children: 3   Years of education: 11   Highest education level: Not on file   Occupational History   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  Tobacco Use   Smoking status: Current Every Day Smoker   Smokeless tobacco: Never Used  Substance and Sexual Activity   Alcohol use: No      Alcohol/week: 0.0 standard drinks   Drug use: No   Sexual activity: Not Currently  Other Topics Concern   Not on file  Social History Narrative    Patient consumes no caffeine    Social Determinants of Health    Financial Resource Strain: Not on file  Food Insecurity: Not on file  Transportation Needs: Not on file  Physical Activity: Not on file  Stress: Not on file  Social Connections: Not on file  Current Outpatient Medications on File Prior to Visit  Medication Sig Dispense Refill   amLODipine-valsartan (EXFORGE) 10-320 MG tablet Take 1 tablet by mouth daily.       aspirin EC 81 MG tablet Take 1 tablet (81 mg total) by mouth daily.       atorvastatin (LIPITOR) 10 MG tablet Take 1 tablet (10 mg total) by mouth daily. 90 tablet 3   chlorhexidine (PERIDEX) 0.12 % solution 15 mLs 2 (two) times daily.       citalopram (CELEXA) 10 MG tablet Take 20 mg by mouth daily.       cyanocobalamin 100 MCG tablet Take 100 mcg by mouth daily.       cyclobenzaprine (FLEXERIL) 10 MG tablet Take 10 mg by mouth 3 (three) times daily as needed for muscle spasms.       doxazosin (CARDURA) 1 MG tablet Take 1 tablet (1 mg total) by mouth daily. 90 tablet 2   ezetimibe (ZETIA) 10 MG tablet Take 10 mg by mouth daily.       furosemide (LASIX) 20 MG tablet Take 20 mg by mouth as needed.       gabapentin (NEURONTIN) 800 MG tablet Take 1 tablet (800 mg total) by mouth 2 (two) times daily. 180 tablet 3   indapamide (LOZOL) 2.5 MG tablet Take 1 tablet (2.5 mg total) by mouth daily. 30 tablet 11   meclizine (ANTIVERT) 25 MG tablet Take 25 mg by mouth 3 (three) times daily as needed.       methocarbamol (ROBAXIN) 500 MG tablet Take 500-1,000  mg by mouth 3 (three) times daily as needed.       metoprolol succinate (TOPROL-XL) 100 MG 24 hr tablet Take 1 tablet (100 mg total) by mouth daily. Take with or immediately following a meal. 30 tablet 11   mirabegron ER (MYRBETRIQ) 25 MG TB24 tablet Take 25 mg by mouth daily.       Multiple Vitamin (MULTIVITAMIN) tablet Take 1 tablet by mouth daily.         nicotine (NICODERM CQ - DOSED IN MG/24 HOURS) 21 mg/24hr patch PLACE 1 PATCH (21 MG TOTAL) ONTO THE SKIN DAILY. 28 patch 2   nitroGLYCERIN (NITROSTAT) 0.4 MG SL tablet Place 1 tablet (0.4 mg total) under the tongue every 5 (five) minutes as needed for chest pain. 25 tablet 3   pantoprazole (PROTONIX) 40 MG tablet Take 40 mg by mouth 2 (two) times daily.       spironolactone (ALDACTONE) 25 MG tablet Take 1 tablet (25 mg total) by mouth daily. 90 tablet 2   tiZANidine (ZANAFLEX) 4 MG tablet Take 8 mg by mouth 3 (three) times daily as needed.       traMADol (ULTRAM) 50 MG tablet Take 50 mg by mouth every 8 (eight) hours as needed.        No current facility-administered medications on file prior to visit.      Review of Systems: a complete, 10pt review of systems was completed with pertinent positives and negatives as documented in the HPI   Physical Exam: Weight: 185 lb   Height: 66 in  Body Surface Area: 1.93 m   Body Mass Index: 29.86 kg/m   Temp.: 95.3 F    Pulse: 85 (Regular)    P.OX: 96% (Room air) BP: 160/78(Sitting, Left Arm, Standard) On the left trapezius there is a mobile subcutaneous mass about 2.5 cm in diameter, mildly tender. On the right side there is similarly located lesion  approximately 7 cm in diameter.     CBC Latest Ref Rng & Units 08/09/2016 08/09/2016 01/08/2016  WBC 4.0 - 10.5 K/uL - 4.0 8.5  Hemoglobin 12.0 - 15.0 g/dL 14.6 13.6 10.0(L)  Hematocrit 36.0 - 46.0 % 43.0 43.0 31.0(L)  Platelets 150 - 400 K/uL - 237 232      CMP Latest Ref Rng & Units 10/22/2020 10/07/2020 09/23/2020  Glucose 65 - 99 mg/dL 90 92 82   BUN 8 - 27 mg/dL 16 19 15   Creatinine 0.57 - 1.00 mg/dL 1.47(H) 1.35(H) 1.15(H)  Sodium 134 - 144 mmol/L 144 142 143  Potassium 3.5 - 5.2 mmol/L 4.0 3.8 4.2  Chloride 96 - 106 mmol/L 103 98 102  CO2 20 - 29 mmol/L 27 28 26   Calcium 8.7 - 10.3 mg/dL 9.6 9.7 9.9  Total Protein 6.0 - 8.5 g/dL - - -  Total Bilirubin 0.0 - 1.2 mg/dL - - -  Alkaline Phos 44 - 121 IU/L - - -  AST 0 - 40 IU/L - - -  ALT 0 - 32 IU/L - - -      Recent Labs       Lab Results  Component Value Date    INR 1.04 12/25/2015    INR 1.0 ratio 08/05/2010        Imaging: Imaging Results (Last 48 hours)  No results found.       A/P: SUBCUTANEOUS MASS (R22.9) Story: 2 masses, one on each side of her upper back. These are symptomatic with pain increasing in size. She wishes to have this removed. We discussed the procedure and risks of bleeding, infection, pain, scarring, hematoma/seroma, wound healing problems, recurrence of the lesions. Questions local and answered. We will schedule at her convenience.  Case was canceled day of surgery 6/17 by anesthesia due to blood pressure concerns. Rescheduled for 6/28.           Patient Active Problem List    Diagnosis Date Noted   82 04/03/2020   Nutritional counseling 01/19/2018   Primary osteoarthritis of left knee 01/03/2016   Mild to Moderate Aortic Insufficiency     Hypertension     Hyperlipidemia     Morbid obesity ([place])     Pulmonary hypertension ([place]) 01/30/2013   Orthostatic hypotension 12/18/2012   Aortic valve disorder 07/29/2010   CHEST PAIN-PRECORDIAL 07/29/2010   CYST OF THYROID 01/06/2010   HYPOKALEMIA 01/06/2010   HYPERLIPIDEMIA 12/24/2009   OBESITY 12/24/2009   HYPERTENSION, UNSPECIFIED 12/24/2009   Personal history of unspecified circulatory disease 12/24/2009   PEPTIC ULCER DISEASE, HX OF 12/24/2009          Romana Juniper, MD [facility]

## 2021-05-20 ENCOUNTER — Other Ambulatory Visit: Payer: Self-pay | Admitting: Neurology

## 2021-05-21 ENCOUNTER — Other Ambulatory Visit: Payer: Self-pay

## 2021-05-21 ENCOUNTER — Encounter (HOSPITAL_BASED_OUTPATIENT_CLINIC_OR_DEPARTMENT_OTHER): Payer: Self-pay | Admitting: Surgery

## 2021-05-21 NOTE — Progress Notes (Addendum)
Spoke w/ via phone for pre-op interview--- Pt Lab needs dos----  Istat             Lab results------ current ekg in epic/ chart COVID test -----patient states asymptomatic no test needed  Arrive at -------  1230 on 05-26-2021 NPO after MN NO Solid Food.  Clear liquids from MN until--- 1130 Med rec completed Medications to take morning of surgery ----- Gabapentin, Celexa, Zetia, Cardura, Toprol, Protonix, Mybetriq, Flexeril Diabetic medication ----- n/a  Patient instructed no nail polish to be worn day of surgery Patient instructed to bring photo id and insurance card day of surgery Patient aware to have Driver (ride ) [employer]-- friend, Will Keithley/ caregiver  for 24 hours after surgery ---  daughter, Quenten Raven  Patient Special Instructions ----- advised to take all her blood medication as prescribed every day and take cardura and toprol morning of surgery.  Pre-Op special Istructions ---  this pt was scheduled 05-15-2021 and cancelled dos by anesthesia due elevated bp.  Patient verbalized understanding of instructions that were given at this phone interview. Patient denies shortness of breath, chest pain, fever, cough at this phone interview.    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  PCP:  Raelyn Number PA (per pt has appt 05-25-2021) Cardiologist : Dr Linard Millers (lov 08-08-2020 epic) HTN Clinic:   lov 10-22-2020 Neurologist:  Dr Rexene Alberts  (lov 08-11-2020 epic)  Chest x-ray : CTA 02-04-2020 EKG : 08-08-2020 Echo : 07-22-2020 Stress test:  12-29-2009 Cardiac Cath : 08-07-2010 Activity level:  only sometimes sob w/ stairs Sleep Study/ CPAP : NO  Blood Thinner/ Instructions Maryjane Hurter Dose:  NO ASA / Instructions/ Last Dose :  ASA 81mg  Per pt by Dr Windle Guard continue asa and not stop prior to surgery

## 2021-05-29 NOTE — Progress Notes (Addendum)
Patient was called twice at [phone] for pre-op assessment and instructions with no success. Messages were left. Patient's son Shaterica, Mcclatchy was called at  [phone] and he said that he is not home and he doesn't know why his mom didn't answer to the phone. Writer told to patient's son that our staff will contact his mom next week.

## 2021-06-02 ENCOUNTER — Other Ambulatory Visit: Payer: Self-pay

## 2021-06-02 ENCOUNTER — Encounter (HOSPITAL_COMMUNITY): Payer: Self-pay | Admitting: Surgery

## 2021-06-02 NOTE — Progress Notes (Signed)
Spoke with pt for pre-op call. Pt denies cardiac history, but is treated for HTN and has a hx of a stroke with left side weakness in 2007. Pt states she is not diabetic.   Pt's surgery is scheduled as ambulatory so no Covid test is required prior to surgery. Pt denies any recent symptoms of Covid.

## 2021-06-03 NOTE — Anesthesia Preprocedure Evaluation (Addendum)
Anesthesia Evaluation  Patient identified by MRN, date of birth, ID band Patient awake    Reviewed: Allergy & Precautions, NPO status , Patient's Chart, lab work & pertinent test results  Airway Mallampati: II  TM Distance: >3 FB Neck ROM: Full    Dental no notable dental hx.    Pulmonary Current Smoker and Patient abstained from smoking.,    Pulmonary exam normal breath sounds clear to auscultation       Cardiovascular hypertension, Pt. on medications and Pt. on home beta blockers Normal cardiovascular exam Rhythm:Regular Rate:Normal  IMPRESSIONS   1. Left ventricular ejection fraction, by estimation, is 60 to 65%. The left ventricle has normal function. The left ventricle has no regional wall motion abnormalities. There is mild left ventricular hypertrophy. Left ventriculardiastolic parameters  are consistent with Grade I diastolic dysfunction (impaired relaxation). 2. Right ventricular systolic function is normal. The right ventricular size is normal. There is normal pulmonary artery systolic pressure. The estimatedright ventricular systolic pressure is 06.0 mmHg. 3. Left atrial size was moderately dilated. 4. The mitral valve is normal in structure. Trivial mitral valveregurgitation. No evidence of mitral stenosis. 5. The aortic valve is tricuspid. Aortic valve regurgitation is mild. Aortic valve area, by VTI measures 1.81 cm. Aortic valve mean gradient measures 11.0 mmHg. Visually, the aortic valve appears to open well. Possible elevated meangradient due to  high flow rather than mild aortic stenosis. 6. Aortic dilatation noted. 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 7. The inferior vena cava is normal in size with greater than 50% respiratoryvariability, suggesting right atrial pressure of 3 mmHg.    Neuro/Psych PSYCHIATRIC DISORDERS Anxiety Depression CVA, Residual Symptoms negative psych  ROS   GI/Hepatic Neg liver ROS, hiatal hernia, GERD  ,  Endo/Other  negative endocrine ROS  Renal/GU negative Renal ROS  negative genitourinary   Musculoskeletal  (+) Arthritis ,   Abdominal   Peds negative pediatric ROS (+)  Hematology negative hematology ROS (+)   Anesthesia Other Findings   Reproductive/Obstetrics negative OB ROS                            Anesthesia Physical Anesthesia Plan  ASA: 3  Anesthesia Plan: General   Post-op Pain Management:    Induction: Intravenous  PONV Risk Score and Plan: 3 and Ondansetron and Dexamethasone  Airway Management Planned: Oral ETT  Additional Equipment:   Intra-op Plan:   Post-operative Plan: Extubation in OR  Informed Consent: I have reviewed the patients History and Physical, chart, labs and discussed the procedure including the risks, benefits and alternatives for the proposed anesthesia with the patient or authorized representative who has indicated his/her understanding and acceptance.     Dental advisory given  Plan Discussed with: Surgeon and CRNA  Anesthesia Plan Comments:        Anesthesia Quick Evaluation

## 2021-06-04 ENCOUNTER — Other Ambulatory Visit: Payer: Self-pay

## 2021-06-04 ENCOUNTER — Ambulatory Visit (HOSPITAL_COMMUNITY)
Admission: RE | Admit: 2021-06-04 | Discharge: 2021-06-04 | Disposition: A | Discharge status: Home / Self Care | Payer: Medicare Other | Attending: Surgery | Admitting: Surgery

## 2021-06-04 ENCOUNTER — Encounter (HOSPITAL_COMMUNITY)
Admission: RE | Disposition: A | Discharge status: Home / Self Care | Payer: Self-pay | Source: Home / Self Care | Attending: Surgery

## 2021-06-04 ENCOUNTER — Ambulatory Visit (HOSPITAL_COMMUNITY): Payer: Medicare Other | Admitting: Vascular Surgery

## 2021-06-04 ENCOUNTER — Encounter (HOSPITAL_COMMUNITY): Payer: Self-pay | Admitting: Surgery

## 2021-06-04 DIAGNOSIS — Z8249 Family history of ischemic heart disease and other diseases of the circulatory system: Secondary | ICD-10-CM | POA: Insufficient documentation

## 2021-06-04 DIAGNOSIS — D171 Benign lipomatous neoplasm of skin and subcutaneous tissue of trunk: Secondary | ICD-10-CM | POA: Insufficient documentation

## 2021-06-04 DIAGNOSIS — R2232 Localized swelling, mass and lump, left upper limb: Secondary | ICD-10-CM | POA: Diagnosis not present

## 2021-06-04 DIAGNOSIS — Z885 Allergy status to narcotic agent status: Secondary | ICD-10-CM | POA: Insufficient documentation

## 2021-06-04 DIAGNOSIS — Z79899 Other long term (current) drug therapy: Secondary | ICD-10-CM | POA: Diagnosis not present

## 2021-06-04 DIAGNOSIS — I69354 Hemiplegia and hemiparesis following cerebral infarction affecting left non-dominant side: Secondary | ICD-10-CM | POA: Insufficient documentation

## 2021-06-04 DIAGNOSIS — Z91048 Other nonmedicinal substance allergy status: Secondary | ICD-10-CM | POA: Insufficient documentation

## 2021-06-04 DIAGNOSIS — Z8379 Family history of other diseases of the digestive system: Secondary | ICD-10-CM | POA: Diagnosis not present

## 2021-06-04 DIAGNOSIS — E876 Hypokalemia: Secondary | ICD-10-CM | POA: Diagnosis not present

## 2021-06-04 DIAGNOSIS — F17200 Nicotine dependence, unspecified, uncomplicated: Secondary | ICD-10-CM | POA: Insufficient documentation

## 2021-06-04 DIAGNOSIS — I129 Hypertensive chronic kidney disease with stage 1 through stage 4 chronic kidney disease, or unspecified chronic kidney disease: Secondary | ICD-10-CM | POA: Diagnosis not present

## 2021-06-04 DIAGNOSIS — R2231 Localized swelling, mass and lump, right upper limb: Secondary | ICD-10-CM | POA: Diagnosis not present

## 2021-06-04 DIAGNOSIS — R2233 Localized swelling, mass and lump, upper limb, bilateral: Secondary | ICD-10-CM | POA: Diagnosis not present

## 2021-06-04 DIAGNOSIS — D1721 Benign lipomatous neoplasm of skin and subcutaneous tissue of right arm: Secondary | ICD-10-CM | POA: Diagnosis not present

## 2021-06-04 DIAGNOSIS — N182 Chronic kidney disease, stage 2 (mild): Secondary | ICD-10-CM | POA: Diagnosis not present

## 2021-06-04 DIAGNOSIS — R222 Localized swelling, mass and lump, trunk: Secondary | ICD-10-CM | POA: Diagnosis present

## 2021-06-04 HISTORY — PX: MASS EXCISION: SHX2000

## 2021-06-04 HISTORY — DX: Unspecified osteoarthritis, unspecified site: M19.90

## 2021-06-04 HISTORY — DX: Cerebral infarction, unspecified: I63.9

## 2021-06-04 HISTORY — DX: Pneumonia, unspecified organism: J18.9

## 2021-06-04 LAB — POCT I-STAT, CHEM 8
BUN: 19 mg/dL (ref 8–23)
Calcium, Ion: 1.07 mmol/L — ABNORMAL LOW (ref 1.15–1.40)
Chloride: 97 mmol/L — ABNORMAL LOW (ref 98–111)
Creatinine, Ser: 1.2 mg/dL — ABNORMAL HIGH (ref 0.44–1.00)
Glucose, Bld: 103 mg/dL — ABNORMAL HIGH (ref 70–99)
HCT: 42 % (ref 36.0–46.0)
Hemoglobin: 14.3 g/dL (ref 12.0–15.0)
Potassium: 3.5 mmol/L (ref 3.5–5.1)
Sodium: 137 mmol/L (ref 135–145)
TCO2: 32 mmol/L (ref 22–32)

## 2021-06-04 SURGERY — EXCISION MASS
Anesthesia: General

## 2021-06-04 MED ORDER — ONDANSETRON HCL 4 MG/2ML IJ SOLN: 4.0000 mg | Freq: Once | INTRAMUSCULAR | Status: DC | PRN

## 2021-06-04 MED ORDER — CHLORHEXIDINE GLUCONATE 0.12 % MT SOLN
15.0000 mL | Freq: Once | OROMUCOSAL | Status: AC
  Filled 2021-06-04: qty 15

## 2021-06-04 MED ORDER — ROCURONIUM BROMIDE 10 MG/ML (PF) SYRINGE
PREFILLED_SYRINGE | INTRAVENOUS | Status: AC
  Filled 2021-06-04: qty 10

## 2021-06-04 MED ORDER — SODIUM CHLORIDE 0.9% FLUSH: 3.0000 mL | Freq: Two times a day (BID) | INTRAVENOUS | Status: DC

## 2021-06-04 MED ORDER — BUPIVACAINE-EPINEPHRINE (PF) 0.25% -1:200000 IJ SOLN
INTRAMUSCULAR | Status: AC
  Filled 2021-06-04: qty 30

## 2021-06-04 MED ORDER — ONDANSETRON HCL 4 MG/2ML IJ SOLN
INTRAMUSCULAR | Status: DC | PRN
  Administered 2021-06-04: 4 mg via INTRAVENOUS

## 2021-06-04 MED ORDER — DEXAMETHASONE SODIUM PHOSPHATE 10 MG/ML IJ SOLN
INTRAMUSCULAR | Status: DC | PRN
  Administered 2021-06-04: 10 mg via INTRAVENOUS

## 2021-06-04 MED ORDER — CHLORHEXIDINE GLUCONATE 4 % EX LIQD: 60.0000 mL | Freq: Once | CUTANEOUS | Status: DC

## 2021-06-04 MED ORDER — FENTANYL CITRATE (PF) 250 MCG/5ML IJ SOLN
INTRAMUSCULAR | Status: AC
  Filled 2021-06-04: qty 5

## 2021-06-04 MED ORDER — SODIUM CHLORIDE 0.9 % IV SOLN: 250.0000 mL | INTRAVENOUS | Status: DC | PRN

## 2021-06-04 MED ORDER — KETOROLAC TROMETHAMINE 30 MG/ML IJ SOLN
30.0000 mg | Freq: Once | INTRAMUSCULAR | Status: AC | PRN
  Administered 2021-06-04: 30 mg via INTRAVENOUS

## 2021-06-04 MED ORDER — ACETAMINOPHEN 650 MG RE SUPP: 650.0000 mg | RECTAL | Status: DC | PRN

## 2021-06-04 MED ORDER — LACTATED RINGERS IV SOLN: INTRAVENOUS | Status: DC

## 2021-06-04 MED ORDER — DEXAMETHASONE SODIUM PHOSPHATE 10 MG/ML IJ SOLN
INTRAMUSCULAR | Status: AC
  Filled 2021-06-04: qty 1

## 2021-06-04 MED ORDER — BUPIVACAINE-EPINEPHRINE (PF) 0.25% -1:200000 IJ SOLN
INTRAMUSCULAR | Status: DC | PRN
  Administered 2021-06-04: 30 mL via PERINEURAL

## 2021-06-04 MED ORDER — CHLORHEXIDINE GLUCONATE 0.12 % MT SOLN
OROMUCOSAL | Status: AC
  Administered 2021-06-04: 15 mL via OROMUCOSAL
  Filled 2021-06-04: qty 15

## 2021-06-04 MED ORDER — GLYCOPYRROLATE 0.2 MG/ML IJ SOLN
INTRAMUSCULAR | Status: DC | PRN
  Administered 2021-06-04: .2 mg via INTRAVENOUS

## 2021-06-04 MED ORDER — LIDOCAINE 2% (20 MG/ML) 5 ML SYRINGE
INTRAMUSCULAR | Status: AC
  Filled 2021-06-04: qty 10

## 2021-06-04 MED ORDER — SUGAMMADEX SODIUM 200 MG/2ML IV SOLN
INTRAVENOUS | Status: DC | PRN
  Administered 2021-06-04: 200 mg via INTRAVENOUS

## 2021-06-04 MED ORDER — PROPOFOL 500 MG/50ML IV EMUL
INTRAVENOUS | Status: DC | PRN
  Administered 2021-06-04: 120 mg via INTRAVENOUS

## 2021-06-04 MED ORDER — ACETAMINOPHEN 325 MG PO TABS: 650.0000 mg | ORAL_TABLET | ORAL | Status: DC | PRN

## 2021-06-04 MED ORDER — OXYCODONE HCL 5 MG PO TABS: 5.0000 mg | ORAL_TABLET | ORAL | Status: DC | PRN

## 2021-06-04 MED ORDER — FENTANYL CITRATE (PF) 100 MCG/2ML IJ SOLN: 25.0000 ug | INTRAMUSCULAR | Status: DC | PRN

## 2021-06-04 MED ORDER — ROCURONIUM BROMIDE 10 MG/ML (PF) SYRINGE
PREFILLED_SYRINGE | INTRAVENOUS | Status: DC | PRN
  Administered 2021-06-04: 60 mg via INTRAVENOUS

## 2021-06-04 MED ORDER — ONDANSETRON HCL 4 MG/2ML IJ SOLN
INTRAMUSCULAR | Status: AC
  Filled 2021-06-04: qty 2

## 2021-06-04 MED ORDER — TRAMADOL HCL 50 MG PO TABS: 50.0000 mg | ORAL_TABLET | Freq: Four times a day (QID) | ORAL | 0 refills | Status: AC | PRN

## 2021-06-04 MED ORDER — LIDOCAINE 2% (20 MG/ML) 5 ML SYRINGE
INTRAMUSCULAR | Status: DC | PRN
  Administered 2021-06-04: 100 mg via INTRAVENOUS

## 2021-06-04 MED ORDER — ACETAMINOPHEN 500 MG PO TABS
1000.0000 mg | ORAL_TABLET | ORAL | Status: AC
  Administered 2021-06-04: 1000 mg via ORAL
  Filled 2021-06-04: qty 2

## 2021-06-04 MED ORDER — 0.9 % SODIUM CHLORIDE (POUR BTL) OPTIME
TOPICAL | Status: DC | PRN
  Administered 2021-06-04: 1000 mL

## 2021-06-04 MED ORDER — FENTANYL CITRATE (PF) 250 MCG/5ML IJ SOLN
INTRAMUSCULAR | Status: DC | PRN
  Administered 2021-06-04: 100 ug via INTRAVENOUS

## 2021-06-04 MED ORDER — PROPOFOL 10 MG/ML IV BOLUS
INTRAVENOUS | Status: AC
  Filled 2021-06-04: qty 20

## 2021-06-04 MED ORDER — CEFAZOLIN SODIUM-DEXTROSE 2-4 GM/100ML-% IV SOLN
2.0000 g | INTRAVENOUS | Status: AC
  Administered 2021-06-04: 2 g via INTRAVENOUS
  Filled 2021-06-04: qty 100

## 2021-06-04 MED ORDER — KETOROLAC TROMETHAMINE 30 MG/ML IJ SOLN
INTRAMUSCULAR | Status: AC
  Filled 2021-06-04: qty 1

## 2021-06-04 MED ORDER — SODIUM CHLORIDE 0.9% FLUSH: 3.0000 mL | INTRAVENOUS | Status: DC | PRN

## 2021-06-04 SURGICAL SUPPLY — 45 items
APL SKNCLS STERI-STRIP NONHPOA (GAUZE/BANDAGES/DRESSINGS) ×2
BAG COUNTER SPONGE SURGICOUNT (BAG) ×2 IMPLANT
BAG SPNG CNTER NS LX DISP (BAG) ×1
BENZOIN TINCTURE PRP APPL 2/3 (GAUZE/BANDAGES/DRESSINGS) ×2 IMPLANT
BLADE CLIPPER SURG (BLADE) IMPLANT
BNDG GAUZE ELAST 4 BULKY (GAUZE/BANDAGES/DRESSINGS) IMPLANT
CANISTER SUCT 3000ML PPV (MISCELLANEOUS) ×2 IMPLANT
CLSR STERI-STRIP ANTIMIC 1/2X4 (GAUZE/BANDAGES/DRESSINGS) ×2 IMPLANT
CONT SPEC 4OZ CLIKSEAL STRL BL (MISCELLANEOUS) ×2 IMPLANT
COVER SURGICAL LIGHT HANDLE (MISCELLANEOUS) ×2 IMPLANT
DRAPE LAPAROSCOPIC ABDOMINAL (DRAPES) ×1 IMPLANT
DRAPE LAPAROTOMY 100X72 PEDS (DRAPES) IMPLANT
DRSG PAD ABDOMINAL 8X10 ST (GAUZE/BANDAGES/DRESSINGS) IMPLANT
ELECT REM PT RETURN 9FT ADLT (ELECTROSURGICAL) ×2
ELECTRODE REM PT RTRN 9FT ADLT (ELECTROSURGICAL) ×1 IMPLANT
GAUZE SPONGE 4X4 12PLY STRL (GAUZE/BANDAGES/DRESSINGS) IMPLANT
GAUZE SPONGE 4X4 12PLY STRL LF (GAUZE/BANDAGES/DRESSINGS) ×2 IMPLANT
GLOVE SURG ENC MOIS LTX SZ6 (GLOVE) ×2 IMPLANT
GLOVE SURG UNDER LTX SZ6.5 (GLOVE) ×2 IMPLANT
GLOVE SURG UNDER POLY LF SZ7 (GLOVE) ×1 IMPLANT
GLOVE SURG UNDER POLY LF SZ7.5 (GLOVE) ×1 IMPLANT
GOWN STRL REUS W/ TWL LRG LVL3 (GOWN DISPOSABLE) ×2 IMPLANT
GOWN STRL REUS W/TWL LRG LVL3 (GOWN DISPOSABLE) ×4
KIT BASIN OR (CUSTOM PROCEDURE TRAY) ×2 IMPLANT
KIT TURNOVER KIT B (KITS) ×2 IMPLANT
NDL 18GX1X1/2 (RX/OR ONLY) (NEEDLE) IMPLANT
NDL HYPO 25GX1X1/2 BEV (NEEDLE) IMPLANT
NEEDLE 18GX1X1/2 (RX/OR ONLY) (NEEDLE) ×2 IMPLANT
NEEDLE HYPO 25GX1X1/2 BEV (NEEDLE) ×2 IMPLANT
NS IRRIG 1000ML POUR BTL (IV SOLUTION) ×2 IMPLANT
PACK GENERAL/GYN (CUSTOM PROCEDURE TRAY) ×2 IMPLANT
PACK LITHOTOMY IV (CUSTOM PROCEDURE TRAY) IMPLANT
PAD ARMBOARD 7.5X6 YLW CONV (MISCELLANEOUS) ×2 IMPLANT
PENCIL SMOKE EVACUATOR (MISCELLANEOUS) ×2 IMPLANT
SPONGE T-LAP 18X18 ~~LOC~~+RFID (SPONGE) ×1 IMPLANT
SUT MNCRL AB 4-0 PS2 18 (SUTURE) ×2 IMPLANT
SUT VIC AB 3-0 SH 27 (SUTURE) ×4
SUT VIC AB 3-0 SH 27X BRD (SUTURE) IMPLANT
SWAB COLLECTION DEVICE MRSA (MISCELLANEOUS) IMPLANT
SWAB CULTURE ESWAB REG 1ML (MISCELLANEOUS) IMPLANT
SYR CONTROL 10ML LL (SYRINGE) ×1 IMPLANT
TAPE CLOTH SURG 4X10 WHT LF (GAUZE/BANDAGES/DRESSINGS) ×2 IMPLANT
TOWEL GREEN STERILE (TOWEL DISPOSABLE) ×2 IMPLANT
TOWEL GREEN STERILE FF (TOWEL DISPOSABLE) ×2 IMPLANT
UNDERPAD 30X36 HEAVY ABSORB (UNDERPADS AND DIAPERS) ×2 IMPLANT

## 2021-06-04 NOTE — Anesthesia Postprocedure Evaluation (Signed)
Anesthesia Post Note  Patient: Sarina Ill  Procedure(s) Performed: EXCISION SUCUTANEOUS MASS X2, UPPER BACK     Patient location during evaluation: PACU Anesthesia Type: General Level of consciousness: awake and alert Pain management: pain level controlled Vital Signs Assessment: post-procedure vital signs reviewed and stable Respiratory status: spontaneous breathing, nonlabored ventilation, respiratory function stable and patient connected to nasal cannula oxygen Cardiovascular status: blood pressure returned to baseline and stable Postop Assessment: no apparent nausea or vomiting Anesthetic complications: no   No notable events documented.  Last Vitals:  Vitals:   06/04/21 1025 06/04/21 1040  BP: (!) 152/83 (!) 163/87  Pulse: 71 68  Resp: 16 20  Temp:  (!) 36.1 C  SpO2: 97% 97%    Last Pain:  Vitals:   06/04/21 1040  TempSrc:   PainSc: 0-No pain                 Kionte Baumgardner S

## 2021-06-04 NOTE — Discharge Instructions (Signed)
GENERAL SURGERY: POST OP INSTRUCTIONS  EAT Gradually transition to a high fiber diet with a fiber supplement over the next few weeks after discharge.  Start with a pureed / full liquid diet (see below)  WALK Walk an hour a day (cumulative, not all at once).  Control your pain to do that.    CONTROL PAIN Control pain so that you can walk, sleep, tolerate sneezing/coughing, go up/down stairs.  HAVE A BOWEL MOVEMENT DAILY Keep your bowels regular to avoid problems.  OK to try a laxative to override constipation.  OK to use an antidairrheal to slow down diarrhea.  Call if not better after 2 tries  CALL IF YOU HAVE PROBLEMS/CONCERNS Call if you are still struggling despite following these instructions. Call if you have concerns not answered by these instructions    DIET: Follow a light bland diet & liquids the first 24 hours after arrival home, such as soup, liquids, starches, etc.  Be sure to drink plenty of fluids.  Quickly advance to a usual solid diet within a few days.  Avoid fast food or heavy meals as your are more likely to get nauseated or have irregular bowels.  A low-sugar, high-fiber diet for the rest of your life is ideal.   Take your usually prescribed home medications unless otherwise directed. PAIN CONTROL: Pain is best controlled by a usual combination of three different methods TOGETHER: Ice/Heat Over the counter pain medication Prescription pain medication Most patients will experience some swelling and bruising around the incisions.  Ice packs or heating pads (30-60 minutes up to 6 times a day) will help. Use ice for the first few days to help decrease swelling and bruising, then switch to heat to help relax tight/sore spots and speed recovery.  Some people prefer to use ice alone, heat alone, alternating between ice & heat.  Experiment to what works for you.  Swelling and bruising can take several weeks to resolve.   It is helpful to take an over-the-counter pain  medication regularly for the first few weeks.  Choose one of the following that works best for you: Naproxen (Aleve, etc)  Two 220mg  tabs twice a day OR Ibuprofen (Advil, etc) Three 200mg  tabs four times a day (every meal & bedtime) AND Acetaminophen (Tylenol, etc) 500-650mg  four times a day (every meal & bedtime) A  prescription for pain medication (such as oxycodone, hydrocodone, etc) should be given to you upon discharge.  Take your pain medication as prescribed.  If you are having problems/concerns with the prescription medicine (does not control pain, nausea, vomiting, rash, itching, etc), please call us [phone] to see if we need to switch you to a different pain medicine that will work better for you and/or control your side effect better. If you need a refill on your pain medication, please contact your pharmacy.  They will contact our office to request authorization. Prescriptions will not be filled after 5 pm or on week-ends. Avoid getting constipated.  Between the surgery and the pain medications, it is common to experience some constipation.  Increasing fluid intake and taking a fiber supplement (such as Metamucil, Citrucel, FiberCon, MiraLax, etc) 1-2 times a day regularly will usually help prevent this problem from occurring.  A mild laxative (prune juice, Milk of Magnesia, MiraLax, etc) should be taken according to package directions if there are no bowel movements after 48 hours.   Wash / shower every day, starting 2 days after surgery.  You may shower over the  steri strips as they are waterproof.  Continue to shower over incision(s) after the dressing is off. Remove your outer bandage 2 days after surgery; steri strips will peel off after 1-2 weeks.  You may leave the incision open to air.  You may have skin tapes (Steri Strips) covering the incision(s).  Leave them on until one week, then remove.  You may replace a dressing/Band-Aid to cover the incision for comfort if you wish.       ACTIVITIES as tolerated:   You may resume regular (light) daily activities beginning the next day--such as daily self-care, walking, climbing stairs--gradually increasing activities as tolerated.  If you can walk 30 minutes without difficulty, it is safe to try more intense activity such as jogging, treadmill, bicycling, low-impact aerobics, swimming, etc. Save the most intensive and strenuous activity for last such as sit-ups, heavy lifting, contact sports, etc  Refrain from any heavy lifting or straining until you are off narcotics for pain control.   DO NOT PUSH THROUGH PAIN.  Let pain be your guide: If it hurts to do something, don't do it.  Pain is your body warning you to avoid that activity for another week until the pain goes down. You may drive when you are no longer taking prescription pain medication, you can comfortably wear a seatbelt, and you can safely maneuver your car and apply brakes. You may have sexual intercourse when it is comfortable.  FOLLOW UP in our office Please call [facility] at [phone] to set up an appointment to see your surgeon in the office for a follow-up appointment approximately 2-3 weeks after your surgery. Make sure that you call for this appointment the day you arrive home to insure a convenient appointment time. 9. IF YOU HAVE DISABILITY OR FAMILY LEAVE FORMS, BRING THEM TO THE OFFICE FOR PROCESSING.  DO NOT GIVE THEM TO YOUR DOCTOR.   WHEN TO CALL US [phone]: Poor pain control Reactions / problems with new medications (rash/itching, nausea, etc)  Fever over 101.5 F (38.5 C) Worsening swelling or bruising Continued bleeding from incision. Increased pain, redness, or drainage from the incision Difficulty breathing / swallowing   The clinic staff is available to answer your questions during regular business hours (8:30am-5pm).  Please don't hesitate to call and ask to speak to one of our nurses for clinical concerns.   If you have a medical  emergency, go to the nearest emergency room or call 911.  A surgeon from [facility] is always on call at the hospitals   [facility] [address], [address], [place], [place]  [address] ? MAIN: [phone] ? TOLL FREE: [phone] ?  FAX [phone] [email or web address]  

## 2021-06-04 NOTE — Op Note (Signed)
Operative Note  Steffi Noviello  [id number]  [id number]  06/04/2021   Surgeon: Romana Juniper MD FACS   Assistant: Lucas Mallow MD ([id number]). I was personally present during the key and critical portions of this procedure and immediately available throughout the entire procedure, as documented in my operative note.   Procedure performed: Excision of 3 x 2 x 2 cm subcutaneous left shoulder mass, excision of 8 x 7 x 6 right shoulder subcutaneous mass with intramuscular component   Preop diagnosis: Subcutaneous masses Post-op diagnosis/intraop findings: Same   Specimens: Masses as above Retained items: No EBL: Minimal cc Complications: none   Description of procedure: After obtaining informed consent the patient was taken to the operating room and placed supine on operating room table where general endotracheal anesthesia was initiated, preoperative antibiotics were administered, SCDs applied, and a formal timeout was performed.  She was then repositioned in the prone position with all pressure points appropriately padded.  The neck, shoulders and upper back were prepped and draped in usual sterile fashion.  After infiltration with local, the previously marked lesions were excised via transverse incisions with the use of blunt dissection and cautery.  The lesion on the right shoulder was well demarcated and encapsulated but extended into the musculature and fascia.  The lesion on the left shoulder was not a discrete mass but a Morphis collection of adipose tissue.  Once the lesions were excised, hemostasis was ensured within each wound.  The deep soft tissues were reapproximated with interrupted 3-0 Vicryl's.  The skin was then closed with interrupted deep dermal 3-0 Vicryl followed by running subcuticular 4-0 Monocryl.  Benzoin, Steri-Strips and dry dressings were applied.  The patient was then returned to the supine position, awakened, extubated and taken to PACU in stable condition.    All  counts were correct at the completion of the case.

## 2021-06-04 NOTE — Transfer of Care (Signed)
Immediate Anesthesia Transfer of Care Note  Patient: Sarina Ill  Procedure(s) Performed: EXCISION SUCUTANEOUS MASS X2, UPPER BACK  Patient Location: PACU  Anesthesia Type:General  Level of Consciousness: awake, alert  and oriented  Airway & Oxygen Therapy: Patient Spontanous Breathing and Patient connected to face mask oxygen  Post-op Assessment: Report given to RN and Post -op Vital signs reviewed and stable  Post vital signs: Reviewed and stable  Last Vitals:  Vitals Value Taken Time  BP 147/98 06/04/21 1008  Temp    Pulse 72 06/04/21 1008  Resp 13 06/04/21 1008  SpO2 100 % 06/04/21 1008  Vitals shown include unvalidated device data.  Last Pain:  Vitals:   06/04/21 0717  TempSrc:   PainSc: 0-No pain      Patients Stated Pain Goal: 2 (38/[id number])  Complications: No notable events documented.

## 2021-06-04 NOTE — Anesthesia Procedure Notes (Signed)

## 2021-06-04 NOTE — Interval H&P Note (Signed)
History and Physical Interval Note:  06/04/2021 7:49 AM  Chelsea [name]  has presented today for surgery, with the diagnosis of SUBCUTANEOUS MASS.  The various methods of treatment have been discussed with the patient and family. After consideration of risks, benefits and other options for treatment, the patient has consented to  Procedure(s): EXCISION SUCUTANEOUS MASS X2, UPPER BACK (N/A) as a surgical intervention.  The patient's history has been reviewed, patient examined, no change in status, stable for surgery.  I have reviewed the patient's chart and labs.  Questions were answered to the patient's satisfaction.     Tanuj Mullens Rich Brave

## 2021-06-05 ENCOUNTER — Encounter (HOSPITAL_COMMUNITY): Payer: Self-pay | Admitting: Surgery

## 2021-06-05 LAB — SURGICAL PATHOLOGY

## 2021-07-22 DIAGNOSIS — M4316 Spondylolisthesis, lumbar region: Secondary | ICD-10-CM | POA: Diagnosis not present

## 2021-07-22 DIAGNOSIS — M5416 Radiculopathy, lumbar region: Secondary | ICD-10-CM | POA: Diagnosis not present

## 2021-07-22 DIAGNOSIS — Z5181 Encounter for therapeutic drug level monitoring: Secondary | ICD-10-CM | POA: Diagnosis not present

## 2021-07-22 DIAGNOSIS — I10 Essential (primary) hypertension: Secondary | ICD-10-CM | POA: Diagnosis not present

## 2021-07-29 DIAGNOSIS — R350 Frequency of micturition: Secondary | ICD-10-CM | POA: Diagnosis not present

## 2021-08-11 ENCOUNTER — Other Ambulatory Visit: Payer: Self-pay

## 2021-08-11 ENCOUNTER — Ambulatory Visit (INDEPENDENT_AMBULATORY_CARE_PROVIDER_SITE_OTHER): Payer: Medicare Other | Admitting: Adult Health

## 2021-08-11 ENCOUNTER — Encounter: Payer: Self-pay | Admitting: Adult Health

## 2021-08-11 VITALS — BP 133/84 | HR 65 | Ht 66.0 in | Wt 178.6 lb

## 2021-08-11 DIAGNOSIS — I69354 Hemiplegia and hemiparesis following cerebral infarction affecting left non-dominant side: Secondary | ICD-10-CM

## 2021-08-11 DIAGNOSIS — I69398 Other sequelae of cerebral infarction: Secondary | ICD-10-CM | POA: Diagnosis not present

## 2021-08-11 DIAGNOSIS — G890 Central pain syndrome: Secondary | ICD-10-CM

## 2021-08-11 DIAGNOSIS — R279 Unspecified lack of coordination: Secondary | ICD-10-CM | POA: Diagnosis not present

## 2021-08-11 MED ORDER — GABAPENTIN 800 MG PO TABS: 800.0000 mg | ORAL_TABLET | Freq: Two times a day (BID) | ORAL | 3 refills | Status: DC

## 2021-08-11 NOTE — Patient Instructions (Signed)
Your Plan:  Continue gabapentin 800 mg twice a day If your symptoms worsen or you develop new symptoms please let us know.  Thank you for coming to see [place] at [facility]. I hope we have been able to provide you high quality care today.  You may receive a patient satisfaction survey over the next few weeks. We would appreciate your feedback and comments so that we may continue to improve ourselves and the health of our patients.

## 2021-08-11 NOTE — Progress Notes (Addendum)
PATIENT: Chelsea [name] DOB: September 26, 1951  REASON FOR VISIT: follow up HISTORY FROM: patient PRIMARY NEUROLOGIST: Dr. 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  She returns today for follow-up.  She reports that her symptoms have remained stable.  Continues to have weakness on the left side.  Reports that gabapentin 800 mg twice a day controls her discomfort.  Reports that she did complete physical therapy.  She states that she had 2 falls in July but fortunately did not suffer any injuries.  She does use a Rollator when ambulating.  She returns today for an evaluation.  HISTORY  08/11/2020: She reports that her weakness has been stable on the left side since 2007.  She does report that when she is stressed out or upset, she feels that her numbness gets worse on the left side.  She continues to take gabapentin 800 mg twice daily for discomfort on the left side.  She has not been using her ankle brace on the left, she may have misplaced it but will look for it.  She is trying to quit smoking but continues to smoke approximately a pack per day currently.  She has seen cardiology and has been started on metoprolol.  She was told she had a aortic aneurysm that needs to be monitored.  She may need surgery eventually for this.  She saw Dr. Tamala Julian in cardiology on 08/08/2020 and I reviewed the note.    She had a brain MRI w/wo contrast on 11/19/08 and I reviewed the results:  IMPRESSION:  1.  No evidence of acute ischemia.  2.  Stable non specific supratentorial subcortical white matter  changes most likely representing areas of ischemic gliosis due to  small vessel disease related to hypertension or diabetes.  3.  Interval near-complete resolution of the previous right  thalamic hemorrhage.  4.  Small focal area of enhancement within the epicenter of the  hemorrhage, which probably represents contrast  pooling.  No  evidence of a vascular abnormality however is suggested on the MRI  examination.   She had a brain MRI with and without contrast, MRA head without contrast and MRA neck with and without contrast through [facility] on 10/03/2009 and I reviewed the results:   Conclusion:  Linear area of postcontrast enhancement in the right thalamus more  likely represents granulation tissue from prior hemorrhage than a  cavernoma given its appearance. The peripheral area of low T2 signal  intensity is most consistent with hemosiderin deposition of remote  blood products.  If outside MRI is available for comparison this  would be helpful to more accurately characterize the chronicity of  this process.   Slight decrease perfusion in the left watershed supratentorial brain  without stenosis identified in the arterial circulation of the neck  or brain. This may be secondary to asymmetric artifact in the study  technique.   Scattered foci of T2 signal hyperintensity in the supratentorial  white matter greater on the right than left which are nonspecific but  most likely related to chronic small vessel ischemic disease.   No acute infarct.   Unremarkable MRA of the intracranial and cervical vessels.      The patient's allergies, current medications, family history, past medical history, past social history, past surgical history and problem list were reviewed and updated as appropriate.     REVIEW OF SYSTEMS: Out of a  complete 14 system review of symptoms, the patient complains only of the following symptoms, and all other reviewed systems are negative.  ALLERGIES: Allergies  Allergen Reactions   Other Swelling    Hair dye, caused eye swelling, multiple times experienced this reaction   Hydrocodone Nausea And Vomiting   Oxycodone Nausea And Vomiting    HOME MEDICATIONS: Outpatient Medications Prior to Visit  Medication Sig Dispense Refill   amLODipine-valsartan (EXFORGE) 10-320 MG  tablet Take 1 tablet by mouth daily.     aspirin EC 81 MG tablet Take 1 tablet (81 mg total) by mouth daily.     atorvastatin (LIPITOR) 10 MG tablet Take 1 tablet (10 mg total) by mouth daily. 90 tablet 3   chlorhexidine (PERIDEX) 0.12 % solution Use as directed 15 mLs in the mouth or throat 2 (two) times daily.     citalopram (CELEXA) 10 MG tablet Take 10 mg by mouth daily.     cloNIDine (CATAPRES) 0.3 MG tablet Take 0.3 mg by mouth 2 (two) times daily.     cyclobenzaprine (FLEXERIL) 10 MG tablet Take 10 mg by mouth 3 (three) times daily as needed (Arthritis in lower back).     doxazosin (CARDURA) 1 MG tablet Take 1 tablet (1 mg total) by mouth daily. 90 tablet 2   ezetimibe (ZETIA) 10 MG tablet Take 10 mg by mouth daily.     gabapentin (NEURONTIN) 800 MG tablet TAKE 1 TABLET (800 MG TOTAL) BY MOUTH 2 (TWO) TIMES DAILY. 180 tablet 0   indapamide (LOZOL) 2.5 MG tablet Take 1 tablet (2.5 mg total) by mouth daily. 30 tablet 11   meclizine (ANTIVERT) 25 MG tablet Take 25 mg by mouth 3 (three) times daily as needed for dizziness or nausea (vertigo).     metoprolol succinate (TOPROL-XL) 100 MG 24 hr tablet Take 1 tablet (100 mg total) by mouth daily. Take with or immediately following a meal. 30 tablet 11   mirabegron ER (MYRBETRIQ) 25 MG TB24 tablet Take 25 mg by mouth daily.     Multiple Vitamin (MULTIVITAMIN) tablet Take 1 tablet by mouth daily.       nicotine (NICODERM CQ - DOSED IN MG/24 HOURS) 21 mg/24hr patch PLACE 1 PATCH (21 MG TOTAL) ONTO THE SKIN DAILY. (Patient taking differently: Place 21 mg onto the skin daily as needed (every now and then).) 28 patch 2   pantoprazole (PROTONIX) 40 MG tablet Take 40 mg by mouth daily.     spironolactone (ALDACTONE) 25 MG tablet Take 1 tablet (25 mg total) by mouth daily. 90 tablet 2   vitamin B-12 (CYANOCOBALAMIN) 500 MCG tablet Take 500 mcg by mouth daily.     No facility-administered medications prior to visit.    PAST MEDICAL HISTORY: Past Medical  History:  Diagnosis Date   Anxiety    [facility])    followed by dr dr h. Tamala Julian,  per echo 08/ 2021 52m and chest CT 03/ 2021 4.0.cm   Arthritis    lower back   BPPV (benign paroxysmal positional vertigo)    Chronic back pain    CKD (chronic kidney disease), stage II    Depression    GERD (gastroesophageal reflux disease)    Headache    Hemiparesis affecting left side as late effect of cerebrovascular accident ([facility]    Hiatal hernia    History of anemia    History of cerebrovascular accident (CVA) with residual deficit 03/2006   (neurologist--- dr aRexene Alberts admission in  epic, acute right thalamic hypertensive hemorrhage w/ left sided hemiparesis   History of gastric ulcer 2003   w/ upper gi bleed,  egd with no intervention needed   Hyperlipidemia    Hypertension    followed by cardiology--- dr h. Tamala Julian and HTN clinic   (nuclear stress test 01-31-20121 normal w/ ef 54%; cardaic cath 08-07-2010 no sig coronary obstruction, preserved LVF, mild AR   Multinodular goiter    followed by pcp,  last ultrasound in epic 03-20-2020 , never met critiria for bx   Nonrheumatic aortic (valve) insufficiency    followed by cardiologist--- dr h. Tamala Julian,  last echo in epic 08/ 2021 ef 60-65%, mild AR with ava 1.81cm^2 and mean gradient 11.10mHg   OAB (overactive bladder)    urologist--- dr mMatilde Sprang  Pneumonia    as a child   Spondylolisthesis, lumbar region    Stroke ([facility]    left side weakness 2007   Subcutaneous mass of back    upper back   Thalamic pain syndrome    secondary to acute right thalamic hypertensive hemorrhage in 2007,  followed by dr aRexene Alberts  Urge incontinence    Wears glasses    Wears hearing aid in both ears    Wears partial dentures    upper and lower    PAST SURGICAL HISTORY: Past Surgical History:  Procedure Laterality Date   ABDOMINAL HYSTERECTOMY     CARDIAC CATHETERIZATION  08/07/2010   @ [facility] s[facility]ia Foyer--  no sig coronary obstruction,  preserved LVF, mild AR   MASS EXCISION N/A 06/04/2021   Procedure: EXCISION SUCUTANEOUS MASS X2, UPPER BACK;  Surgeon: CClovis Riley MD;  [place]  Service: General;  Laterality: N/A;   TOTAL KNEE ARTHROPLASTY Left 01/05/2016   Procedure: TOTAL KNEE ARTHROPLASTY;  Surgeon: FFrederik Pear MD;  [place]  Service: Orthopedics;  Laterality: Left;   TUBAL LIGATION Bilateral 2000    FAMILY HISTORY: Family History  Problem Relation Age of Onset   Hypertension Mother        deceased   Peripheral vascular disease Mother    Cirrhosis Father        DeCEASED   Heart attack Other        grandmother deceased   Cancer Brother    Drug abuse Brother        overdose, in 1 brother   Breast cancer Neg Hx     SOCIAL HISTORY: Social History   Socioeconomic History   Marital status: Married    Spouse name: Not on file   Number of children: 3   Years of education: 11   Highest education level: Not on file  Occupational History   Occupation: child care    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  Tobacco Use   Smoking status: Every Day    Packs/day: 1.00    Years: 50.00    Pack years: 50.00    Types: Cigarettes   Smokeless tobacco: Never  Vaping Use   Vaping Use: Never used  Substance and Sexual Activity   Alcohol use: No    Alcohol/week: 0.0 standard drinks   Drug use: No   Sexual activity: Never  Other Topics Concern   Not on file  Social History Narrative   Patient consumes no caffeine   Social Determinants of Health   Financial Resource Strain: Not on file  Food Insecurity: Not on file  Transportation Needs: Not on file  Physical  Activity: Not on file  Stress: Not on file  Social Connections: Not on file  Intimate Partner Violence: Not on file      PHYSICAL EXAM  Vitals:   08/11/21 1020  BP: 133/84  Pulse: 65  Weight: 178 lb 9.6 oz (81 kg)  Height: _0  (1.676 m)   Body mass index is 28.83 kg/m.  Generalized: Well  developed, in no acute distress   Neurological examination  Mentation: Alert oriented to time, place, history taking. Follows all commands speech and language fluent Cranial nerve II-XII: Pupils were equal round reactive to light. Extraocular movements were full, visual field were full on confrontational test. Facial sensation and strength were normal. Uvula tongue midline. Head turning and shoulder shrug  were normal and symmetric. Motor: The motor testing reveals 5 over 5 strength of all 4 extremities. Good symmetric motor tone is noted throughout.  Sensory: Sensory testing is intact to soft touch on all 4 extremities. No evidence of extinction is noted.  Coordination: Cerebellar testing reveals good finger-nose-finger and heel-to-shin bilaterally.  Gait and station: Gait is normal. Tandem gait is normal. Romberg is negative. No drift is seen.  Reflexes: Deep tendon reflexes are symmetric and normal bilaterally.   DIAGNOSTIC DATA (LABS, IMAGING, TESTING) - I reviewed patient records, labs, notes, testing and imaging myself where available.  Lab Results  Component Value Date   WBC 4.0 08/09/2016   HGB 14.3 06/04/2021   HCT 42.0 06/04/2021   MCV 89.8 08/09/2016   PLT 237 08/09/2016      Component Value Date/Time   NA 137 06/04/2021 0721   NA 144 10/22/2020 1352   K 3.5 06/04/2021 0721   CL 97 (L) 06/04/2021 0721   CO2 27 10/22/2020 1352   GLUCOSE 103 (H) 06/04/2021 0721   BUN 19 06/04/2021 0721   BUN 16 10/22/2020 1352   CREATININE 1.20 (H) 06/04/2021 0721   CREATININE 1.11 (H) 04/20/2016 1409   CALCIUM 9.6 10/22/2020 1352   PROT 6.8 08/11/2020 1058   ALBUMIN 3.8 08/11/2020 1058   AST 18 08/11/2020 1058   ALT 16 08/11/2020 1058   ALKPHOS 104 08/11/2020 1058   BILITOT 0.4 08/11/2020 1058   GFRNONAA 36 (L) 10/22/2020 1352   GFRAA 42 (L) 10/22/2020 1352   Lab Results  Component Value Date   CHOL 134 04/20/2016   HDL 54 04/20/2016   LDLCALC 60 04/20/2016   TRIG 99  04/20/2016   CHOLHDL 2.5 04/20/2016   No results found for: HGBA1C No results found for: VITAMINB12 Lab Results  Component Value Date   TSH 1.420  12/05/2009      ASSESSMENT AND PLAN 70 y.o. year old female  has a past medical history of Anxiety, Aortic aneurysm without rupture ([place]), Arthritis, BPPV (benign paroxysmal positional vertigo), Chronic back pain, CKD (chronic kidney disease), stage II, Depression, GERD (gastroesophageal reflux disease), Headache, Hemiparesis affecting left side as late effect of cerebrovascular accident ([place]), Hiatal hernia, History of anemia, History of cerebrovascular accident (CVA) with residual deficit (03/2006), History of gastric ulcer (2003), Hyperlipidemia, Hypertension, Multinodular goiter, Nonrheumatic aortic (valve) insufficiency, OAB (overactive bladder), Pneumonia, Spondylolisthesis, lumbar region, Stroke ([place]), Subcutaneous mass of back, Thalamic pain syndrome, Urge incontinence, Wears glasses, Wears hearing aid in both ears, and Wears partial dentures. here with:  1.  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  Abnormality of gait and balance  --Continue gabapentin 800 mg twice a day --Encouraged the patient to use Rollator at all times. --Advised if she begins  to have frequent falls she should let us know --Follow-up in 1 year or sooner if needed     Ward Givens, MSN, NP-C 08/11/2021, 10:27 AM [facility] [address], [place], [place] [address] [phone]  I reviewed the above note and documentation by the Nurse Practitioner and agree with the history, exam, assessment and plan as outlined above. I was available for consultation. Star Age, MD, PhD [facility] [place])

## 2021-08-12 DIAGNOSIS — R3915 Urgency of urination: Secondary | ICD-10-CM | POA: Diagnosis not present

## 2021-08-12 DIAGNOSIS — K219 Gastro-esophageal reflux disease without esophagitis: Secondary | ICD-10-CM | POA: Diagnosis not present

## 2021-08-12 DIAGNOSIS — Z9989 Dependence on other enabling machines and devices: Secondary | ICD-10-CM | POA: Diagnosis not present

## 2021-08-12 DIAGNOSIS — G44209 Tension-type headache, unspecified, not intractable: Secondary | ICD-10-CM | POA: Diagnosis not present

## 2021-08-12 DIAGNOSIS — R42 Dizziness and giddiness: Secondary | ICD-10-CM | POA: Diagnosis not present

## 2021-08-12 DIAGNOSIS — R600 Localized edema: Secondary | ICD-10-CM | POA: Diagnosis not present

## 2021-08-12 DIAGNOSIS — E782 Mixed hyperlipidemia: Secondary | ICD-10-CM | POA: Diagnosis not present

## 2021-08-12 DIAGNOSIS — E7132 Disorders of ketone metabolism: Secondary | ICD-10-CM | POA: Diagnosis not present

## 2021-08-12 DIAGNOSIS — I10 Essential (primary) hypertension: Secondary | ICD-10-CM | POA: Diagnosis not present

## 2021-08-20 ENCOUNTER — Other Ambulatory Visit: Payer: Self-pay | Admitting: Interventional Cardiology

## 2021-09-15 ENCOUNTER — Other Ambulatory Visit: Payer: Self-pay | Admitting: Neurology

## 2021-09-23 DIAGNOSIS — H905 Unspecified sensorineural hearing loss: Secondary | ICD-10-CM | POA: Diagnosis not present

## 2021-09-24 ENCOUNTER — Other Ambulatory Visit: Payer: Self-pay | Admitting: Interventional Cardiology

## 2021-09-28 DIAGNOSIS — M24542 Contracture, left hand: Secondary | ICD-10-CM | POA: Diagnosis not present

## 2021-10-01 ENCOUNTER — Other Ambulatory Visit: Payer: Self-pay | Admitting: Interventional Cardiology

## 2021-10-01 DIAGNOSIS — Z720 Tobacco use: Secondary | ICD-10-CM

## 2021-10-05 NOTE — Progress Notes (Signed)
Cardiology Office Note:    Date:  10/06/2021   ID:  Chelsea [name], DOB [birth year], MRN [id number]  PCP:  Trey Sailors, PA  Cardiologist:  Sinclair Grooms, MD   Referring MD: Trey Sailors, [place]   Chief Complaint  Patient presents with   Cardiac Valve Problem   Thoracic Aortic Aneurysm    History of Present Illness:    Chelsea [name] is a 70 y.o. female with a hx of  mild aortic regurgitation, non-cardiac chest pain, prior history of stroke with residual left-sided weakness, hyperlipidemia, hypertension, and moderate obesity.   Chelsea [name] acute dyspnea going upstairs.  She does have left-sided weakness related to her prior stroke.  She now lives in a two-story apartment.  She has to go up and down the stairs several times per day.  She did not complain so much before about dyspnea because she left in a single-story.  She is able to do all the things that she could do before.  She walks her dog.  When she is walking on flat surfaces without climbing, there is no dyspnea.  She denies chest pain.  She has not had new neurological symptoms.  There is no orthopnea.  Past Medical History:  Diagnosis Date   Anxiety    Aortic aneurysm without rupture [facility])    followed by dr dr h. 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   (nuclear stress test 01-31-20121 normal w/ ef 54%; cardaic cath 08-07-2010 no sig coronary obstruction, preserved LVF, mild AR   Multinodular goiter    followed by pcp,  last ultrasound in epic 03-20-2020 , never met critiria for bx   Nonrheumatic aortic (valve) insufficiency    followed by cardiologist--- dr h. 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,  followed by dr atRexene Alberts Urge incontinence    Wears glasses    Wears hearing aid in both ears    Wears partial dentures    upper and lower    Past Surgical History:  Procedure Laterality Date   ABDOMINAL HYSTERECTOMY     CARDIAC CATHETERIZATION  08/07/2010   [facility]-  no sig coronary obstruction, preserved LVF,  mild AR   MASS EXCISION N/A 06/04/2021   Procedure: EXCISION SUCUTANEOUS MASS X2, UPPER BACK;  Surgeon: Clovis Riley, MD;  Location: [place];  Service: General;  Laterality: N/A;   TOTAL KNEE ARTHROPLASTY Left 01/05/2016   Procedure: TOTAL KNEE ARTHROPLASTY;  Surgeon: Frederik Pear, MD;  Location: [place];  Service: Orthopedics;  Laterality: Left;   TUBAL LIGATION Bilateral 2000    Current Medications: Current Meds  Medication Sig   amLODipine-valsartan (EXFORGE) 10-320 MG tablet Take 1 tablet by mouth daily.   aspirin EC 81 MG tablet Take 1 tablet (81 mg total) by mouth daily.   atorvastatin (LIPITOR) 10 MG tablet Take 1 tablet (10 mg total) by mouth daily.   chlorhexidine (PERIDEX) 0.12 % solution Use as directed 15 mLs in the mouth or throat 2 (two) times daily.   citalopram (CELEXA) 10 MG  tablet Take 10 mg by mouth daily.   cloNIDine (CATAPRES) 0.3 MG tablet Take 0.3 mg by mouth 2 (two) times daily.   cyclobenzaprine (FLEXERIL) 10 MG tablet Take 10 mg by mouth 3 (three) times daily as needed (Arthritis in lower back).   cyclobenzaprine (FLEXERIL) 10 MG tablet Take 1 tablet by mouth 3 (three) times daily.   ezetimibe (ZETIA) 10 MG tablet Take 10 mg by mouth daily.   gabapentin (NEURONTIN) 800 MG tablet Take 1 tablet (800 mg total) by mouth 2 (two) times daily.   indapamide (LOZOL) 2.5 MG tablet Take 1 tablet (2.5 mg total) by mouth daily.   meclizine (ANTIVERT) 25 MG tablet Take 25 mg by mouth 3 (three) times daily as needed for dizziness or nausea (vertigo).   methocarbamol (ROBAXIN) 500 MG tablet Take 500 mg by mouth 3 (three) times daily as needed.   metoprolol succinate (TOPROL-XL) 100 MG 24 hr tablet TAKE 1 TABLET (100 MG TOTAL) BY MOUTH DAILY. TAKE WITH OR IMMEDIATELY FOLLOWING A MEAL.   mirabegron ER (MYRBETRIQ) 25 MG TB24 tablet Take 25 mg by mouth daily.   Multiple Vitamin (MULTIVITAMIN) tablet Take 1 tablet by mouth daily.     naproxen (NAPROSYN) 375 MG tablet Take 375 mg by mouth as needed.   nicotine (NICODERM CQ - DOSED IN MG/24 HOURS) 21 mg/24hr patch PLACE 1 PATCH (21 MG TOTAL) ONTO THE SKIN DAILY.   ondansetron (ZOFRAN) 8 MG tablet Take 8 mg by mouth 3 (three) times daily as needed.   pantoprazole (PROTONIX) 40 MG tablet Take 40 mg by mouth daily.   traMADol (ULTRAM) 50 MG tablet Take 50 mg by mouth as needed.   vitamin B-12 (CYANOCOBALAMIN) 500 MCG tablet Take 500 mcg by mouth daily.   [DISCONTINUED] doxazosin (CARDURA) 1 MG tablet Take 1 tablet (1 mg total) by mouth daily.   [DISCONTINUED] spironolactone (ALDACTONE) 25 MG tablet TAKE 1 TABLET (25 MG TOTAL) BY MOUTH DAILY.     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  Tobacco Use   Smoking status: Every Day    Packs/day: 1.00    Years: 50.00    Pack years: 50.00    Types: Cigarettes   Smokeless tobacco: Never  Vaping Use   Vaping  Use: Never used  Substance and Sexual Activity   Alcohol use: No    Alcohol/week: 0.0 standard drinks   Drug use: No   Sexual activity: Never  Other Topics Concern   Not on file  Social History Narrative   Patient consumes no caffeine   Social Determinants of Health   Financial Resource Strain: Not on file  Food Insecurity: Not on file  Transportation Needs: Not on file  Physical Activity: Not on file  Stress: Not on file  Social Connections: Not on file     Family History: The patient's family history includes Cancer in her brother; Cirrhosis in her father; Drug abuse in her brother; Heart attack in an other family member; Hypertension in her mother; Peripheral vascular disease in her mother. There is no history of Breast cancer.  ROS:   Please see the history of present illness.    She is compliant with her current medical regimen.  Denies palpitations.  No episodes of syncope.  No peripheral edema.  All other systems reviewed and are negative.  EKGs/Labs/Other Studies Reviewed:    The following studies were reviewed today:  Chest CT scan March 2021: IMPRESSION: 1. Borderline aneurysmal dilatation of the ascending thoracic aorta, measuring 4.0 cm in maximum diameter. Recommend annual imaging followup by CTA or MRA. This recommendation follows 2010 ACCF/AHA/AATS/ACR/ASA/SCA/SCAI/SIR/STS/SVM Guidelines for the Diagnosis and Management of Patients with Thoracic Aortic Disease. Circulation. 2010; 121: J093-O671. Aortic aneurysm NOS (ICD10-I71.9) 2. Cardiomegaly and minimal pericardial effusion. 3. Cholelithiasis. 4. Interval 1.8 cm  cystic appearing right thyroid nodule with a small amount of peripheral soft tissue nodularity. Recommend thyroid US (ref: J Am Coll Radiol. 2015 Feb;12(2): 143-50).  2D Doppler echocardiogram performed in 2021. IMPRESSIONS   1. Left ventricular ejection fraction, by estimation, is 60 to 65%. The  left ventricle has normal function. The left ventricle has no regional  wall motion abnormalities. There is mild left ventricular hypertrophy.  Left ventricular diastolic parameters  are consistent with Grade I diastolic dysfunction (impaired relaxation).   2. Right ventricular systolic function is normal. The right ventricular  size is normal. There is normal pulmonary artery systolic pressure. The  estimated right ventricular systolic pressure is 24.5 mmHg.   3. Left atrial size was moderately dilated.   4. The mitral valve is normal in structure. Trivial mitral valve  regurgitation. No evidence of mitral stenosis.   5. The aortic valve is tricuspid. Aortic valve regurgitation is mild.  Aortic valve area, by VTI measures 1.81 cm. Aortic valve mean gradient  measures 11.0 mmHg. Visually, the aortic valve appears to open well.  Possible elevated mean gradient due to  high flow rather than mild aortic stenosis.   6. Aortic dilatation noted. There is mild dilatation of the ascending  aorta measuring 44 mm.   7. The inferior vena cava is normal in size with greater than 50%  respiratory variability, suggesting right atrial pressure of 3 mmHg.    EKG:  EKG normal sinus rhythm, normal PR interval, poor R wave progression V1 through V6, prominent voltage consistent with left ventricular hypertrophy.  Left axis deviation.  No changes noted compared to prior.  Recent Labs: 06/04/2021: BUN 19; Creatinine, Ser 1.20; Hemoglobin 14.3; Potassium 3.5; Sodium 137  Recent Lipid Panel    Component Value Date/Time   CHOL 134 04/20/2016 1409   TRIG 99 04/20/2016 1409   HDL 54 04/20/2016 1409   CHOLHDL 2.5  04/20/2016 1409   VLDL 20 04/20/2016 1409  [place] 60 04/20/2016 1409    Physical Exam:    VS:  BP 112/82   Pulse 69   Ht 5' 6"  (1.676 m)   Wt 177 lb 9.6 oz (80.6 kg)   SpO2 98%   BMI 28.67 kg/m     Wt Readings from Last 3 Encounters:  10/06/21 177 lb 9.6 oz (80.6 kg)  08/11/21 178 lb 9.6 oz (81 kg)  06/04/21 178 lb (80.7 kg)     GEN: Slightly overweight. No acute distress HEENT: Normal NECK: No JVD. LYMPHATICS: No lymphadenopathy CARDIAC: 2/6 systolic and 2/6 to 3/6 decrescendo diastolic murmur. RRR no gallop, or edema. VASCULAR:  Normal Pulses. No bruits. RESPIRATORY:  Clear to auscultation without rales, wheezing or rhonchi  ABDOMEN: Soft, non-tender, non-distended, No pulsatile mass, MUSCULOSKELETAL: No deformity  SKIN: Warm and dry NEUROLOGIC:  Alert and oriented x 3 PSYCHIATRIC:  Normal affect   ASSESSMENT:    1. Aortic aneurysm without rupture, unspecified portion of aorta ([place])   2. Nonrheumatic aortic valve insufficiency   3. Primary hypertension   4. Other hyperlipidemia   5. Morbid obesity ([place])   6. Essential hypertension    PLAN:    In order of problems listed above:  Comparison aortic CT angio to compare with the prior study done in March 2021.  This study should have been done several months ago but this is the follow-up date that the patient was assigned.  She has no symptoms relative to the aneurysm.  She is on beta-blocker therapy and valsartan. Aortic regurgitation is audible.  Her symptoms do not sound like heart failure.  We will repeat echo on this occasion as well to exclude the worsening of aortic regurgitation as the cause of the patient's dyspnea with stair climbing. Excellent blood pressure control on Exforge, Catapres, Cardura, Lozol, Toprol-XL, and Aldactone.  Depending upon findings on echo, we may consider decreasing intensity of beta-blocker therapy to shorten diastolic filling time by allowing heart rate to increase. Continue Lipitor  and Zetia.   Encouraged physical activity.   Target BP: <130/80 mmHg  Diet and lifestyle measures for BP control were reviewed in detail: Low sodium diet (<2.5 gm daily); alcohol restriction (<3 ounces per day); weight loss (Mediterranean); avoid non-steroidal agents; > 6 hours sleep per day; 150 min moderate exercise per week. Medical regimen will include at least 2 agents. Resistant hypertension if not controlled on 3 agents. Consider further evaluation: Sleep study to r/o OSA; Renal angiogram; Primary hyperaldonism and Pheochromocytoma w/u. After 3 agents, consider MRA (spironolactone)/ Epleronone), hydralazine, beta-blocker, and Minoxidil if not already in use due to patient profile.    Medication Adjustments/Labs and Tests Ordered: Current medicines are reviewed at length with the patient today.  Concerns regarding medicines are outlined above.  Orders Placed This Encounter  Procedures   EKG 12-Lead   Meds ordered this encounter  Medications   doxazosin (CARDURA) 1 MG tablet    Sig: Take 1 tablet (1 mg total) by mouth daily.    Dispense:  90 tablet    Refill:  2   spironolactone (ALDACTONE) 25 MG tablet    Sig: Take 1 tablet (25 mg total) by mouth daily.    Dispense:  90 tablet    Refill:  2    There are no Patient Instructions on file for this visit.   Signed, Sinclair Grooms, MD  10/06/2021 2:03 PM    [place]

## 2021-10-06 ENCOUNTER — Other Ambulatory Visit: Payer: Self-pay

## 2021-10-06 ENCOUNTER — Ambulatory Visit (INDEPENDENT_AMBULATORY_CARE_PROVIDER_SITE_OTHER): Payer: Medicare Other | Admitting: Interventional Cardiology

## 2021-10-06 ENCOUNTER — Encounter: Payer: Self-pay | Admitting: Interventional Cardiology

## 2021-10-06 VITALS — BP 112/82 | HR 69 | Ht 66.0 in | Wt 177.6 lb

## 2021-10-06 DIAGNOSIS — E7849 Other hyperlipidemia: Secondary | ICD-10-CM

## 2021-10-06 DIAGNOSIS — I351 Nonrheumatic aortic (valve) insufficiency: Secondary | ICD-10-CM

## 2021-10-06 DIAGNOSIS — I719 Aortic aneurysm of unspecified site, without rupture: Secondary | ICD-10-CM

## 2021-10-06 DIAGNOSIS — I10 Essential (primary) hypertension: Secondary | ICD-10-CM | POA: Diagnosis not present

## 2021-10-06 MED ORDER — DOXAZOSIN MESYLATE 1 MG PO TABS: 1.0000 mg | ORAL_TABLET | Freq: Every day | ORAL | 2 refills | Status: DC

## 2021-10-06 MED ORDER — SPIRONOLACTONE 25 MG PO TABS: 25.0000 mg | ORAL_TABLET | Freq: Every day | ORAL | 2 refills | Status: DC

## 2021-10-06 NOTE — Patient Instructions (Signed)
Medication Instructions:  Your physician recommends that you continue on your current medications as directed. Please refer to the Current Medication list given to you today.  *If you need a refill on your cardiac medications before your next appointment, please call your pharmacy*   Lab Work: BMET before CT  If you have labs (blood work) drawn today and your tests are completely normal, you will receive your results only by: [place] (if you have MyChart) OR A paper copy in the mail If you have any lab test that is abnormal or we need to change your treatment, we will call you to review the results.   Testing/Procedures: Your physician has requested that you have an echocardiogram around the same time as your CT. Echocardiography is a painless test that uses sound waves to create images of your heart. It provides your doctor with information about the size and shape of your heart and how well your heart's chambers and valves are working. This procedure takes approximately one hour. There are no restrictions for this procedure.  Your physician recommends that you have a CT of the Aorta performed sometime between now and March 2023.  Follow-Up: At [facility], you and your health needs are our priority.  As part of our continuing mission to provide you with exceptional heart care, we have created designated Provider Care Teams.  These Care Teams include your primary Cardiologist (physician) and Advanced Practice Providers (APPs -  Physician Assistants and Nurse Practitioners) who all work together to provide you with the care you need, when you need it.  We recommend signing up for the patient portal called "MyChart".  Sign up information is provided on this After Visit Summary.  MyChart is used to connect with patients for Virtual Visits (Telemedicine).  Patients are able to view lab/test results, encounter notes, upcoming appointments, etc.  Non-urgent messages can be sent to your  provider as well.   To learn more about what you can do with MyChart, go to [email or web address].    Your next appointment:   9-12 month(s)  The format for your next appointment:   In Person  Provider:   Sinclair Grooms, MD     Other Instructions

## 2021-10-07 ENCOUNTER — Other Ambulatory Visit: Payer: Self-pay | Admitting: Interventional Cardiology

## 2021-10-07 DIAGNOSIS — E7849 Other hyperlipidemia: Secondary | ICD-10-CM

## 2021-10-19 DIAGNOSIS — M4316 Spondylolisthesis, lumbar region: Secondary | ICD-10-CM | POA: Diagnosis not present

## 2021-11-04 ENCOUNTER — Inpatient Hospital Stay (HOSPITAL_COMMUNITY)
Admission: EM | Admit: 2021-11-04 | Discharge: 2021-11-13 | DRG: 853 | Disposition: A | Discharge status: Home Health | Payer: Medicare Other | Attending: Internal Medicine | Admitting: Internal Medicine

## 2021-11-04 ENCOUNTER — Encounter (HOSPITAL_COMMUNITY): Payer: Self-pay | Admitting: Anesthesiology

## 2021-11-04 ENCOUNTER — Encounter (HOSPITAL_COMMUNITY): Payer: Self-pay | Admitting: Emergency Medicine

## 2021-11-04 ENCOUNTER — Emergency Department (HOSPITAL_COMMUNITY): Payer: Medicare Other

## 2021-11-04 ENCOUNTER — Encounter (HOSPITAL_COMMUNITY)
Admission: EM | Disposition: A | Discharge status: Home Health | Payer: Self-pay | Source: Home / Self Care | Attending: Internal Medicine

## 2021-11-04 DIAGNOSIS — I129 Hypertensive chronic kidney disease with stage 1 through stage 4 chronic kidney disease, or unspecified chronic kidney disease: Secondary | ICD-10-CM | POA: Diagnosis not present

## 2021-11-04 DIAGNOSIS — Z8249 Family history of ischemic heart disease and other diseases of the circulatory system: Secondary | ICD-10-CM

## 2021-11-04 DIAGNOSIS — T508X5A Adverse effect of diagnostic agents, initial encounter: Secondary | ICD-10-CM | POA: Diagnosis present

## 2021-11-04 DIAGNOSIS — K5720 Diverticulitis of large intestine with perforation and abscess without bleeding: Secondary | ICD-10-CM | POA: Diagnosis not present

## 2021-11-04 DIAGNOSIS — Z8711 Personal history of peptic ulcer disease: Secondary | ICD-10-CM

## 2021-11-04 DIAGNOSIS — F17210 Nicotine dependence, cigarettes, uncomplicated: Secondary | ICD-10-CM | POA: Diagnosis not present

## 2021-11-04 DIAGNOSIS — K631 Perforation of intestine (nontraumatic): Secondary | ICD-10-CM

## 2021-11-04 DIAGNOSIS — K219 Gastro-esophageal reflux disease without esophagitis: Secondary | ICD-10-CM | POA: Diagnosis present

## 2021-11-04 DIAGNOSIS — I10 Essential (primary) hypertension: Secondary | ICD-10-CM | POA: Diagnosis present

## 2021-11-04 DIAGNOSIS — Z79899 Other long term (current) drug therapy: Secondary | ICD-10-CM

## 2021-11-04 DIAGNOSIS — N179 Acute kidney failure, unspecified: Secondary | ICD-10-CM | POA: Diagnosis present

## 2021-11-04 DIAGNOSIS — K567 Ileus, unspecified: Secondary | ICD-10-CM | POA: Diagnosis not present

## 2021-11-04 DIAGNOSIS — Z20822 Contact with and (suspected) exposure to covid-19: Secondary | ICD-10-CM | POA: Diagnosis present

## 2021-11-04 DIAGNOSIS — K651 Peritoneal abscess: Secondary | ICD-10-CM | POA: Diagnosis present

## 2021-11-04 DIAGNOSIS — A4151 Sepsis due to Escherichia coli [E. coli]: Principal | ICD-10-CM | POA: Diagnosis present

## 2021-11-04 DIAGNOSIS — I272 Pulmonary hypertension, unspecified: Secondary | ICD-10-CM | POA: Diagnosis present

## 2021-11-04 DIAGNOSIS — G8929 Other chronic pain: Secondary | ICD-10-CM | POA: Diagnosis not present

## 2021-11-04 DIAGNOSIS — E860 Dehydration: Secondary | ICD-10-CM | POA: Diagnosis present

## 2021-11-04 DIAGNOSIS — Z6828 Body mass index (BMI) 28.0-28.9, adult: Secondary | ICD-10-CM

## 2021-11-04 DIAGNOSIS — I4891 Unspecified atrial fibrillation: Secondary | ICD-10-CM

## 2021-11-04 DIAGNOSIS — I471 Supraventricular tachycardia: Secondary | ICD-10-CM | POA: Diagnosis present

## 2021-11-04 DIAGNOSIS — K6389 Other specified diseases of intestine: Secondary | ICD-10-CM | POA: Diagnosis not present

## 2021-11-04 DIAGNOSIS — Z7982 Long term (current) use of aspirin: Secondary | ICD-10-CM

## 2021-11-04 DIAGNOSIS — E785 Hyperlipidemia, unspecified: Secondary | ICD-10-CM | POA: Diagnosis present

## 2021-11-04 DIAGNOSIS — N1411 Contrast-induced nephropathy: Secondary | ICD-10-CM | POA: Diagnosis present

## 2021-11-04 DIAGNOSIS — N182 Chronic kidney disease, stage 2 (mild): Secondary | ICD-10-CM | POA: Diagnosis present

## 2021-11-04 DIAGNOSIS — I7121 Aneurysm of the ascending aorta, without rupture: Secondary | ICD-10-CM | POA: Diagnosis present

## 2021-11-04 DIAGNOSIS — I69354 Hemiplegia and hemiparesis following cerebral infarction affecting left non-dominant side: Secondary | ICD-10-CM

## 2021-11-04 DIAGNOSIS — B954 Other streptococcus as the cause of diseases classified elsewhere: Secondary | ICD-10-CM | POA: Diagnosis not present

## 2021-11-04 DIAGNOSIS — B9689 Other specified bacterial agents as the cause of diseases classified elsewhere: Secondary | ICD-10-CM | POA: Diagnosis not present

## 2021-11-04 DIAGNOSIS — M549 Dorsalgia, unspecified: Secondary | ICD-10-CM | POA: Diagnosis present

## 2021-11-04 DIAGNOSIS — E876 Hypokalemia: Secondary | ICD-10-CM | POA: Diagnosis not present

## 2021-11-04 DIAGNOSIS — E871 Hypo-osmolality and hyponatremia: Secondary | ICD-10-CM | POA: Diagnosis not present

## 2021-11-04 DIAGNOSIS — K5780 Diverticulitis of intestine, part unspecified, with perforation and abscess without bleeding: Secondary | ICD-10-CM | POA: Diagnosis not present

## 2021-11-04 DIAGNOSIS — K4020 Bilateral inguinal hernia, without obstruction or gangrene, not specified as recurrent: Secondary | ICD-10-CM | POA: Diagnosis not present

## 2021-11-04 DIAGNOSIS — K5900 Constipation, unspecified: Secondary | ICD-10-CM | POA: Diagnosis not present

## 2021-11-04 DIAGNOSIS — Z96652 Presence of left artificial knee joint: Secondary | ICD-10-CM | POA: Diagnosis not present

## 2021-11-04 DIAGNOSIS — K8020 Calculus of gallbladder without cholecystitis without obstruction: Secondary | ICD-10-CM | POA: Diagnosis not present

## 2021-11-04 DIAGNOSIS — F1190 Opioid use, unspecified, uncomplicated: Secondary | ICD-10-CM

## 2021-11-04 DIAGNOSIS — E663 Overweight: Secondary | ICD-10-CM | POA: Diagnosis present

## 2021-11-04 LAB — RESP PANEL BY RT-PCR (FLU A&B, COVID) ARPGX2
Influenza A by PCR: NEGATIVE
Influenza B by PCR: NEGATIVE
SARS Coronavirus 2 by RT PCR: NEGATIVE

## 2021-11-04 LAB — COMPREHENSIVE METABOLIC PANEL
ALT: 11 U/L (ref 0–44)
AST: 14 U/L — ABNORMAL LOW (ref 15–41)
Albumin: 2.9 g/dL — ABNORMAL LOW (ref 3.5–5.0)
Alkaline Phosphatase: 93 U/L (ref 38–126)
Anion gap: 18 — ABNORMAL HIGH (ref 5–15)
BUN: 38 mg/dL — ABNORMAL HIGH (ref 8–23)
CO2: 27 mmol/L (ref 22–32)
Calcium: 9.3 mg/dL (ref 8.9–10.3)
Chloride: 91 mmol/L — ABNORMAL LOW (ref 98–111)
Creatinine, Ser: 1.96 mg/dL — ABNORMAL HIGH (ref 0.44–1.00)
GFR, Estimated: 27 mL/min — ABNORMAL LOW (ref >60)
Glucose, Bld: 135 mg/dL — ABNORMAL HIGH (ref 70–99)
Potassium: 2.7 mmol/L — CL (ref 3.5–5.1)
Sodium: 136 mmol/L (ref 135–145)
Total Bilirubin: 0.5 mg/dL (ref 0.3–1.2)
Total Protein: 7.4 g/dL (ref 6.5–8.1)

## 2021-11-04 LAB — CBC
HCT: 46.4 % — ABNORMAL HIGH (ref 36.0–46.0)
Hemoglobin: 15.1 g/dL — ABNORMAL HIGH (ref 12.0–15.0)
MCH: 29.3 pg (ref 26.0–34.0)
MCHC: 32.5 g/dL (ref 30.0–36.0)
MCV: 89.9 fL (ref 80.0–100.0)
Platelets: 309 10*3/uL (ref 150–400)
RBC: 5.16 MIL/uL — ABNORMAL HIGH (ref 3.87–5.11)
RDW: 13.7 % (ref 11.5–15.5)
WBC: 15.3 10*3/uL — ABNORMAL HIGH (ref 4.0–10.5)
nRBC: 0 % (ref 0.0–0.2)

## 2021-11-04 LAB — MAGNESIUM: Magnesium: 2.3 mg/dL (ref 1.7–2.4)

## 2021-11-04 LAB — LIPASE, BLOOD: Lipase: 27 U/L (ref 11–51)

## 2021-11-04 LAB — LACTIC ACID, PLASMA: Lactic Acid, Venous: 1.3 mmol/L (ref 0.5–1.9)

## 2021-11-04 SURGERY — LAPAROTOMY, EXPLORATORY
Anesthesia: General

## 2021-11-04 MED ORDER — ASPIRIN EC 81 MG PO TBEC
81.0000 mg | DELAYED_RELEASE_TABLET | Freq: Every day | ORAL | Status: DC
  Administered 2021-11-04 – 2021-11-13 (×10): 81 mg via ORAL
  Filled 2021-11-04 (×10): qty 1

## 2021-11-04 MED ORDER — ACETAMINOPHEN 650 MG RE SUPP: 650.0000 mg | Freq: Four times a day (QID) | RECTAL | Status: DC | PRN

## 2021-11-04 MED ORDER — ACETAMINOPHEN 325 MG PO TABS
650.0000 mg | ORAL_TABLET | Freq: Four times a day (QID) | ORAL | Status: DC | PRN
  Administered 2021-11-05 – 2021-11-06 (×2): 650 mg via ORAL
  Filled 2021-11-04 (×2): qty 2

## 2021-11-04 MED ORDER — ONDANSETRON HCL 4 MG/2ML IJ SOLN
4.0000 mg | Freq: Once | INTRAMUSCULAR | Status: AC
  Administered 2021-11-04: 4 mg via INTRAVENOUS
  Filled 2021-11-04: qty 2

## 2021-11-04 MED ORDER — PANTOPRAZOLE SODIUM 40 MG IV SOLR
40.0000 mg | INTRAVENOUS | Status: DC
  Administered 2021-11-04 – 2021-11-12 (×9): 40 mg via INTRAVENOUS
  Filled 2021-11-04 (×8): qty 40

## 2021-11-04 MED ORDER — FENTANYL CITRATE PF 50 MCG/ML IJ SOSY
50.0000 ug | PREFILLED_SYRINGE | Freq: Once | INTRAMUSCULAR | Status: AC
  Administered 2021-11-04: 50 ug via INTRAVENOUS
  Filled 2021-11-04: qty 1

## 2021-11-04 MED ORDER — SODIUM CHLORIDE 0.9 % IV BOLUS
1000.0000 mL | Freq: Once | INTRAVENOUS | Status: AC
  Administered 2021-11-04: 1000 mL via INTRAVENOUS

## 2021-11-04 MED ORDER — ONDANSETRON HCL 4 MG/2ML IJ SOLN
4.0000 mg | Freq: Four times a day (QID) | INTRAMUSCULAR | Status: DC | PRN
  Administered 2021-11-06 – 2021-11-07 (×2): 4 mg via INTRAVENOUS
  Filled 2021-11-04 (×3): qty 2

## 2021-11-04 MED ORDER — HYDROMORPHONE HCL 1 MG/ML IJ SOLN
0.5000 mg | INTRAMUSCULAR | Status: DC | PRN
  Administered 2021-11-05 – 2021-11-08 (×13): 0.5 mg via INTRAVENOUS
  Filled 2021-11-04 (×13): qty 1

## 2021-11-04 MED ORDER — POTASSIUM CHLORIDE 10 MEQ/100ML IV SOLN
10.0000 meq | INTRAVENOUS | Status: AC
  Administered 2021-11-04 (×2): 10 meq via INTRAVENOUS
  Filled 2021-11-04 (×2): qty 100

## 2021-11-04 MED ORDER — ENOXAPARIN SODIUM 40 MG/0.4ML IJ SOSY
40.0000 mg | PREFILLED_SYRINGE | INTRAMUSCULAR | Status: DC
  Administered 2021-11-04 – 2021-11-12 (×9): 40 mg via SUBCUTANEOUS
  Filled 2021-11-04 (×8): qty 0.4

## 2021-11-04 MED ORDER — MAGNESIUM SULFATE 2 GM/50ML IV SOLN
2.0000 g | INTRAVENOUS | Status: AC
  Administered 2021-11-04: 2 g via INTRAVENOUS
  Filled 2021-11-04: qty 50

## 2021-11-04 MED ORDER — LACTATED RINGERS IV SOLN: INTRAVENOUS | Status: AC

## 2021-11-04 MED ORDER — ONDANSETRON HCL 4 MG PO TABS: 4.0000 mg | ORAL_TABLET | Freq: Four times a day (QID) | ORAL | Status: DC | PRN

## 2021-11-04 MED ORDER — POTASSIUM CHLORIDE CRYS ER 20 MEQ PO TBCR
40.0000 meq | EXTENDED_RELEASE_TABLET | Freq: Once | ORAL | Status: AC
  Administered 2021-11-04: 40 meq via ORAL
  Filled 2021-11-04: qty 2

## 2021-11-04 MED ORDER — PIPERACILLIN-TAZOBACTAM 3.375 G IVPB
3.3750 g | Freq: Three times a day (TID) | INTRAVENOUS | Status: DC
  Administered 2021-11-05 – 2021-11-13 (×26): 3.375 g via INTRAVENOUS
  Filled 2021-11-04 (×26): qty 50

## 2021-11-04 MED ORDER — PIPERACILLIN-TAZOBACTAM 3.375 G IVPB 30 MIN
3.3750 g | Freq: Once | INTRAVENOUS | Status: AC
  Administered 2021-11-04: 3.375 g via INTRAVENOUS
  Filled 2021-11-04: qty 50

## 2021-11-04 MED ORDER — NICOTINE 21 MG/24HR TD PT24
21.0000 mg | MEDICATED_PATCH | Freq: Every day | TRANSDERMAL | Status: DC
  Administered 2021-11-04 – 2021-11-13 (×10): 21 mg via TRANSDERMAL
  Filled 2021-11-04 (×10): qty 1

## 2021-11-04 NOTE — Consult Note (Signed)
CC: Diverticulitis  Requesting provider: Dr. Johnney Killian  HPI: Chelsea [name] is an 70 y.o. female hx HTN, HLD, GERD, CKD, prior CVA, who presented to the emergency department this afternoon with complaints of mainly right-sided lower quadrant abdominal pain.  She reports a 4 to 5-day history of this kind of pain.  The pain is like a deep kind of cramp.  It radiates to her lower back.  She denies any significant left-sided abdominal tenderness or pain.  She has had some associated nausea and 2 episodes of emesis last 2 days.  She reports she has been somewhat constipated but has been passing gas and had a bowel movement this morning.   Past Medical History:  Diagnosis Date   Anxiety    Aortic aneurysm without rupture [facility])    followed by dr dr h. 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 [facility]   (nuclear stress test 01-31-20121 normal w/ ef 54%; cardaic cath 08-07-2010 no sig coronary obstruction, preserved LVF, mild AR   Multinodular goiter    followed by pcp,  last ultrasound in epic 03-20-2020 , never met critiria for bx   Nonrheumatic aortic (valve) insufficiency    followed by cardiologist--- dr h. 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,  followed by dr atRexene Alberts Urge incontinence    Wears glasses    Wears hearing aid in both ears    Wears partial dentures    upper and lower    Past Surgical History:  Procedure Laterality Date   ABDOMINAL HYSTERECTOMY     CARDIAC CATHETERIZATION  08/07/2010   [facility]-  no sig coronary obstruction, preserved LVF, mild AR   MASS EXCISION N/A 06/04/2021   Procedure: EXCISION SUCUTANEOUS MASS X2, UPPER BACK;  Surgeon: CoClovis RileyMD;  [facility] Service: General;  Laterality: N/A;   TOTAL KNEE ARTHROPLASTY Left 01/05/2016   Procedure: TOTAL KNEE ARTHROPLASTY;  Surgeon: FrFrederik PearMD;  [facility] Service: Orthopedics;  Laterality: Left;   TUBAL LIGATION Bilateral 2000    Family History  Problem Relation Age of Onset   Hypertension Mother        deceased  Peripheral vascular disease Mother    Cirrhosis Father        DeCEASED   Heart attack Other        grandmother deceased   Cancer Brother    Drug abuse Brother        overdose, in 1 brother   Breast cancer Neg Hx     Social:  reports that she has been smoking cigarettes. She has a 50.00 pack-year smoking history. She has never used smokeless tobacco. She reports that she does not drink alcohol and does not use drugs.  Allergies:  Allergies  Allergen Reactions   Other Swelling    Hair dye, caused eye swelling, multiple times experienced this reaction   Hydrocodone Nausea And Vomiting   Oxycodone Nausea And Vomiting    Medications: I have reviewed the patient's current medications.  Results for orders placed or performed during the hospital encounter of 11/04/21 (from the  past 48 hour(s))  Lipase, blood     Status: None   Collection Time: 11/04/21  1:18 PM  Result Value Ref Range   Lipase 27 11 - 51 U/L    Comment: Performed at [facility], [place] [address], [place], [place] [address]  Comprehensive metabolic panel     Status: Abnormal   Collection Time: 11/04/21  1:18 PM  Result Value Ref Range   Sodium 136 135 - 145 mmol/L   Potassium 2.7 (LL) 3.5 - 5.1 mmol/L    Comment: CRITICAL RESULT CALLED TO, READ BACK BY AND VERIFIED WITH: NEWNAUM,K RN @ 1427 11/04/21 LEONARD,A    Chloride 91 (L) 98 - 111 mmol/L   CO2 27 22 - 32 mmol/L   Glucose, Bld 135 (H) 70 - 99 mg/dL    Comment: Glucose reference range applies only to samples taken after fasting for at least 8 hours.   BUN 38 (H) 8 - 23 mg/dL   Creatinine, Ser 1.96 (H) 0.44 - 1.00 mg/dL   Calcium 9.3 8.9 - 10.3 mg/dL   Total Protein 7.4 6.5 - 8.1 g/dL   Albumin 2.9 (L) 3.5 - 5.0 g/dL   AST 14 (L) 15 - 41 U/L   ALT 11 0 - 44 U/L   Alkaline Phosphatase 93 38 - 126 U/L   Total Bilirubin 0.5 0.3 - 1.2 mg/dL   GFR, Estimated 27 (L) >60 mL/min    Comment: (NOTE) Calculated using the CKD-EPI Creatinine Equation (2021)    Anion gap 18 (H) 5 - 15    Comment: Performed at [facility], [place] [address], [place], [place] [address]  CBC     Status: Abnormal   Collection Time: 11/04/21  1:18 PM  Result Value Ref Range   WBC 15.3 (H) 4.0 - 10.5 K/uL   RBC 5.16 (H) 3.87 - 5.11 MIL/uL   Hemoglobin 15.1 (H) 12.0 - 15.0 g/dL   HCT 46.4 (H) 36.0 - 46.0 %   MCV 89.9 80.0 - 100.0 fL   MCH 29.3 26.0 - 34.0 pg   MCHC 32.5 30.0 - 36.0 g/dL   RDW 13.7 11.5 - 15.5 %   Platelets 309 150 - 400 K/uL   nRBC 0.0 0.0 - 0.2 %    Comment: Performed at [facility], [place] [address], [place], [place] [address]  Magnesium     Status: None   Collection Time: 11/04/21  6:00 PM  Result Value Ref Range   Magnesium 2.3 1.7 - 2.4 mg/dL    Comment: Performed  at [facility], [place] [address], [place], [place]  [address]  Resp Panel by RT-PCR (Flu A&B, Covid) Nasopharyngeal Swab     Status: None   Collection Time: 11/04/21  7:26 PM   Specimen: Nasopharyngeal Swab; Nasopharyngeal(NP) swabs in vial transport medium  Result Value Ref Range   SARS Coronavirus 2 by RT PCR NEGATIVE NEGATIVE    Comment: (NOTE) SARS-CoV-2 target nucleic acids are NOT DETECTED.  The SARS-CoV-2 RNA is generally detectable in upper respiratory specimens during the acute phase of infection. The lowest concentration of SARS-CoV-2 viral copies this assay can detect is 138 copies/mL. A negative result does not preclude SARS-Cov-2 infection and should not be used as the sole basis for treatment or other patient management decisions. A negative result may occur with  improper specimen collection/handling, submission of specimen other than nasopharyngeal swab, presence of viral mutation(s) within the areas targeted by this assay, and inadequate number of viral copies(<138 copies/mL). A negative result must be combined with clinical observations, patient history, and epidemiological information. The expected result is Negative.  Fact Sheet for Patients:  EntrepreneurPulse.com.au  Fact Sheet for Healthcare Providers:  IncredibleEmployment.be  This test is no t yet approved or cleared by the [place] FDA and  has been authorized for detection and/or diagnosis of SARS-CoV-2 by FDA under an Emergency Use Authorization (EUA). This EUA will remain  in effect (meaning this test can be used) for the duration of the COVID-19 declaration under Section 564(b)(1) of the Act, 21 U.S.C.section 360bbb-3(b)(1), unless the authorization is terminated  or revoked sooner.       Influenza A by PCR NEGATIVE NEGATIVE   Influenza B by PCR NEGATIVE NEGATIVE    Comment: (NOTE) The Xpert Xpress SARS-CoV-2/FLU/RSV plus assay is intended as an aid in the diagnosis of influenza from Nasopharyngeal swab  specimens and should not be used as a sole basis for treatment. Nasal washings and aspirates are unacceptable for Xpert Xpress SARS-CoV-2/FLU/RSV testing.  Fact Sheet for Patients: EntrepreneurPulse.com.au  Fact Sheet for Healthcare Providers: IncredibleEmployment.be  This test is not yet approved or cleared by the [place] FDA and has been authorized for detection and/or diagnosis of SARS-CoV-2 by FDA under an Emergency Use Authorization (EUA). This EUA will remain in effect (meaning this test can be used) for the duration of the COVID-19 declaration under Section 564(b)(1) of the Act, 21 U.S.C. section 360bbb-3(b)(1), unless the authorization is terminated or revoked.  Performed at [facility], [place] [address], [place], [place] [address]     CT ABDOMEN PELVIS WO CONTRAST  Result Date: 11/04/2021 CLINICAL DATA:  Abdominal pain, acute, nonlocalized. Constipation, nausea, vomiting. EXAM: CT ABDOMEN AND PELVIS WITHOUT CONTRAST TECHNIQUE: Multidetector CT imaging of the abdomen and pelvis was performed following the standard protocol without IV contrast. COMPARISON:  None. FINDINGS: Lower chest: Mild bibasilar atelectasis.  Mild cardiomegaly. Hepatobiliary: Cholelithiasis without pericholecystic inflammatory change identified. Liver unremarkable. No intra or extrahepatic biliary ductal dilation. Pancreas: Unremarkable Spleen: Unremarkable Adrenals/Urinary Tract: Adrenal glands are unremarkable. Kidneys are normal, without renal calculi, focal lesion, or hydronephrosis. Bladder is unremarkable. Stomach/Bowel: There is extensive extraluminal gas noted interstitially within the wall of the distal sigmoid colon extending into the distal colonic mesentery as well as within the high rectum. Gas subsequently extends into the retroperitoneum surrounding the inferior vena cava and extending into the diaphragmatic hiatus and periportal region. The exact site  of perforation is not clearly identified though extensive inflammatory change involving the distal sigmoid  colon favors this to represent the primary site, best seen on image # 61/3. There is no free intraperitoneal gas identified. Multiple loops of dilated fluid-filled small bowel are identified demonstrating a gradual transition within the ileum most in keeping with a developing ileus. The appendix is normal. No free intraperitoneal fluid or loculated intra-abdominal fluid collections are identified. Vascular/Lymphatic: Aortic atherosclerosis. No enlarged abdominal or pelvic lymph nodes. Reproductive: Status post hysterectomy. No adnexal masses. Other: Small bilateral fat containing inguinal hernias are present. Musculoskeletal: No acute bone abnormality. IMPRESSION: Extensive extraluminal gas noted interstitially within the distal sigmoid colon and rectum with extension of gas into the retroperitoneum and into the colonic mesentery. The site of enteric perforation is not clearly identified, however, extensive inflammatory change suggests this is within the distal sigmoid colon. Developing ileus noted. No free intraperitoneal gas. No loculated intra-abdominal fluid collections. Cholelithiasis. Mild cardiomegaly. Electronically Signed   By: Fidela Salisbury M.D.   On: 11/04/2021 19:15    ROS - all of the below systems have been reviewed with the patient and positives are indicated with bold text General: chills, fever or night sweats Eyes: blurry vision or double vision ENT: epistaxis or sore throat Allergy/Immunology: itchy/watery eyes or nasal congestion Hematologic/Lymphatic: bleeding problems, blood clots or swollen lymph nodes Endocrine: temperature intolerance or unexpected weight changes Breast: new or changing breast lumps or nipple discharge Resp: cough, shortness of breath, or wheezing CV: chest pain or dyspnea on exertion GI: as per HPI GU: dysuria, trouble voiding, or hematuria MSK: joint  pain or joint stiffness Neuro: TIA or stroke symptoms Derm: pruritus and skin lesion changes Psych: anxiety and depression  PE Blood pressure (!) 148/80, pulse (!) 108, temperature 97.9 F (36.6 C), resp. rate 19, SpO2 100 %. Constitutional: NAD; conversant; well appearing, sitting up in bed with son at bedside Eyes: Moist conjunctiva; no lid lag; anicteric Neck: Trachea midline; no thyromegaly Lungs: Normal respiratory effort; no tactile fremitus CV: irreg rate/rhythm; no palpable thrills; 1+ pitting edema GI: Abd soft, focally ttp in RLQ; no left sided tenderness; mild distention; no rebound nor guarding; no palpable hepatosplenomegaly MSK: Normal range of motion of extremities; no clubbing/cyanosis Psychiatric: Appropriate affect; alert and oriented x3 Lymphatic: No palpable cervical or axillary lymphadenopathy  Results for orders placed or performed during the hospital encounter of 11/04/21 (from the past 48 hour(s))  Lipase, blood     Status: None   Collection Time: 11/04/21  1:18 PM  Result Value Ref Range   Lipase 27 11 - 51 U/L    Comment: Performed at [facility], [place] [address], [place], [place] [address]  Comprehensive metabolic panel     Status: Abnormal   Collection Time: 11/04/21  1:18 PM  Result Value Ref Range   Sodium 136 135 - 145 mmol/L   Potassium 2.7 (LL) 3.5 - 5.1 mmol/L    Comment: CRITICAL RESULT CALLED TO, READ BACK BY AND VERIFIED WITH: NEWNAUM,K RN @ 1427 11/04/21 LEONARD,A    Chloride 91 (L) 98 - 111 mmol/L   CO2 27 22 - 32 mmol/L   Glucose, Bld 135 (H) 70 - 99 mg/dL    Comment: Glucose reference range applies only to samples taken after fasting for at least 8 hours.   BUN 38 (H) 8 - 23 mg/dL   Creatinine, Ser 1.96 (H) 0.44 - 1.00 mg/dL   Calcium 9.3 8.9 - 10.3 mg/dL   Total Protein 7.4 6.5 - 8.1 g/dL   Albumin 2.9 (L) 3.5 -  5.0 g/dL   AST 14 (L) 15 - 41 U/L   ALT 11 0 - 44 U/L   Alkaline Phosphatase 93 38 - 126 U/L   Total Bilirubin 0.5  0.3 - 1.2 mg/dL   GFR, Estimated 27 (L) >60 mL/min    Comment: (NOTE) Calculated using the CKD-EPI Creatinine Equation (2021)    Anion gap 18 (H) 5 - 15    Comment: Performed at [place] [address] [place], [place] [address]  CBC     Status: Abnormal   Collection Time: 11/04/21  1:18 PM  Result Value Ref Range   WBC 15.3 (H) 4.0 - 10.5 K/uL   RBC 5.16 (H) 3.87 - 5.11 MIL/uL   Hemoglobin 15.1 (H) 12.0 - 15.0 g/dL   HCT 46.4 (H) 36.0 - 46.0 %   MCV 89.9 80.0 - 100.0 fL   MCH 29.3 26.0 - 34.0 pg   MCHC 32.5 30.0 - 36.0 g/dL   RDW 13.7 11.5 - 15.5 %   Platelets 309 150 - 400 K/uL   nRBC 0.0 0.0 - 0.2 %    Comment: Performed at [facility], [place] [address] [place], [place] [address]  Magnesium     Status: None   Collection Time: 11/04/21  6:00 PM  Result Value Ref Range   Magnesium 2.3 1.7 - 2.4 mg/dL    Comment: Performed at [facility], [place] [address], [place], [place] [address]  Resp Panel by RT-PCR (Flu A&B, Covid) Nasopharyngeal Swab     Status: None   Collection Time: 11/04/21  7:26 PM   Specimen: Nasopharyngeal Swab; Nasopharyngeal(NP) swabs in vial transport medium  Result Value Ref Range   SARS Coronavirus 2 by RT PCR NEGATIVE NEGATIVE    Comment: (NOTE) SARS-CoV-2 target nucleic acids are NOT DETECTED.  The SARS-CoV-2 RNA is generally detectable in upper respiratory specimens during the acute phase of infection. The lowest concentration of SARS-CoV-2 viral copies this assay can detect is 138 copies/mL. A negative result does not preclude SARS-Cov-2 infection and should not be used as the sole basis for treatment or other patient management decisions. A negative result may occur with  improper specimen collection/handling, submission of specimen other than nasopharyngeal swab, presence of viral mutation(s) within the areas targeted by this assay, and inadequate number of viral copies(<138 copies/mL). A negative result must be combined  with clinical observations, patient history, and epidemiological information. The expected result is Negative.  Fact Sheet for Patients:  EntrepreneurPulse.com.au  Fact Sheet for Healthcare Providers:  IncredibleEmployment.be  This test is no t yet approved or cleared by the [place] FDA and  has been authorized for detection and/or diagnosis of SARS-CoV-2 by FDA under an Emergency Use Authorization (EUA). This EUA will remain  in effect (meaning this test can be used) for the duration of the COVID-19 declaration under Section 564(b)(1) of the Act, 21 U.S.C.section 360bbb-3(b)(1), unless the authorization is terminated  or revoked sooner.       Influenza A by PCR NEGATIVE NEGATIVE   Influenza B by PCR NEGATIVE NEGATIVE    Comment: (NOTE) The Xpert Xpress SARS-CoV-2/FLU/RSV plus assay is intended as an aid in the diagnosis of influenza from Nasopharyngeal swab specimens and should not be used as a sole basis for treatment. Nasal washings and aspirates are unacceptable for Xpert Xpress SARS-CoV-2/FLU/RSV testing.  Fact Sheet for Patients: EntrepreneurPulse.com.au  Fact Sheet for Healthcare Providers: IncredibleEmployment.be  This test is not yet approved or  cleared by the [place] and has been authorized for detection and/or diagnosis of SARS-CoV-2 by FDA under an Emergency Use Authorization (EUA). This EUA will remain in effect (meaning this test can be used) for the duration of the COVID-19 declaration under Section 564(b)(1) of the Act, 21 U.S.C. section 360bbb-3(b)(1), unless the authorization is terminated or revoked.  Performed at [facility], [place] [address], [place], [place] [address]     CT ABDOMEN PELVIS WO CONTRAST  Result Date: 11/04/2021 CLINICAL DATA:  Abdominal pain, acute, nonlocalized. Constipation, nausea, vomiting. EXAM: CT ABDOMEN AND PELVIS WITHOUT CONTRAST  TECHNIQUE: Multidetector CT imaging of the abdomen and pelvis was performed following the standard protocol without IV contrast. COMPARISON:  None. FINDINGS: Lower chest: Mild bibasilar atelectasis.  Mild cardiomegaly. Hepatobiliary: Cholelithiasis without pericholecystic inflammatory change identified. Liver unremarkable. No intra or extrahepatic biliary ductal dilation. Pancreas: Unremarkable Spleen: Unremarkable Adrenals/Urinary Tract: Adrenal glands are unremarkable. Kidneys are normal, without renal calculi, focal lesion, or hydronephrosis. Bladder is unremarkable. Stomach/Bowel: There is extensive extraluminal gas noted interstitially within the wall of the distal sigmoid colon extending into the distal colonic mesentery as well as within the high rectum. Gas subsequently extends into the retroperitoneum surrounding the inferior vena cava and extending into the diaphragmatic hiatus and periportal region. The exact site of perforation is not clearly identified though extensive inflammatory change involving the distal sigmoid colon favors this to represent the primary site, best seen on image # 61/3. There is no free intraperitoneal gas identified. Multiple loops of dilated fluid-filled small bowel are identified demonstrating a gradual transition within the ileum most in keeping with a developing ileus. The appendix is normal. No free intraperitoneal fluid or loculated intra-abdominal fluid collections are identified. Vascular/Lymphatic: Aortic atherosclerosis. No enlarged abdominal or pelvic lymph nodes. Reproductive: Status post hysterectomy. No adnexal masses. Other: Small bilateral fat containing inguinal hernias are present. Musculoskeletal: No acute bone abnormality. IMPRESSION: Extensive extraluminal gas noted interstitially within the distal sigmoid colon and rectum with extension of gas into the retroperitoneum and into the colonic mesentery. The site of enteric perforation is not clearly identified,  however, extensive inflammatory change suggests this is within the distal sigmoid colon. Developing ileus noted. No free intraperitoneal gas. No loculated intra-abdominal fluid collections. Cholelithiasis. Mild cardiomegaly. Electronically Signed   By: Fidela Salisbury M.D.   On: 11/04/2021 19:15     A/P: Chelsea [name] is an 70 y.o. female with HTN, HLD, GERD, CKD, prior CVA, here with perforated diverticulitis  -She has "contained" perforation with regards to this gas being within the retroperitoneal spaces.  There is no free fluid.  She is focally tender without generalized peritonitis.  -We spent time reviewing what perforated diverticulitis is and going over her CT scan with her and her son.  She currently does have atrial fibrillation with RVR.  She is hypertensive.  She has been afebrile and clinically looks quite well -We had initially discussed proceeding with surgery given the degree of extraluminal air seen and her tachycardia until it was discovered she was in atrial fibrillation.  She is not interested in proceeding with any sort of surgery at this juncture, recognizing that her perforation could get worse and/or her clinical condition could deteriorate at which point it could become a life or death type decision for her.  If she were clinically deteriorating, she would be in favor of proceeding with surgery. -We discussed what the surgery may involve, including exploratory laparotomy, sigmoid colectomy with end colostomy.  We discussed factors that may preclude subsequent reversal with her underlying health conditions -She and her son are both highly in favor of proceeding with trial of IV antibiotics and monitoring.  We discussed that if her clinical course were to deteriorate over the ensuing 12 to 24 hours, the potential for surgery -We will follow with you  Nadeen Landau, MD [facility] Use [email or web address] to contact on call provider

## 2021-11-04 NOTE — ED Notes (Signed)
Pt to CT

## 2021-11-04 NOTE — ED Provider Notes (Signed)
I provided a substantive portion of the care of this patient.  I personally performed the entirety of the history for this encounter.  EKG Interpretation  Date/Time:  Wednesday November 04 2021 18:19:57 EST Ventricular Rate:  144 PR Interval:  72 QRS Duration: 97 QT Interval:  349 QTC Calculation: 541 R Axis:   -22 Text Interpretation: Sinus or ectopic atrial tachycardia LVH with secondary repolarization abnormality Inferior infarct, acute (LCx) Prolonged QT interval suspect atrial flutter. significant rate increase since previous tracing. Confirmed by Charlesetta Shanks [phone]) on 11/04/2021 6:37:09 PM   Patient has had abdominal pain for 4 to 5 days.  It was gradual in onset.  It is got much worse.  She reports she is had a lot of nausea and vomiting.  She reports she was having some symptoms that she thought were constipation but has had some bowel movement.  She reports the pain is pretty intense through her mid central and upper abdomen.  Observation of the monitor shows irregular heart rhythm suggestive of atrial fibrillation\flutter.  This is then combined sometimes with bigeminy that looks sinus.  Patient denies any symptoms of palpitations chest pain.   Patient is alert.  Clear mental status.  Heart is irregularly irregular.  Lungs grossly clear.  Abdomen is very tender in the central and mid upper abdomen.  Patient presents as outlined.  She does not have prior history of atrial fib or flutter.  This appears to be new onset.  Monitor showed variable rates from 80s up to 140s.  Lower rates, appears sinus with intermittent bigeminy and PACs.  At higher rates appears to be combination of fib and flutter.  Patient was hypokalemia.  Potassium supplementation and magnesium supplementation initiated.  CT shows probable perforation.  Patient will be started on Zosyn.  I agree with plan of management.   Charlesetta Shanks, MD 11/04/21 [phone]

## 2021-11-04 NOTE — ED Notes (Signed)
Lab called with K2.7 Alroy Bailiff, [place] notified.

## 2021-11-04 NOTE — H&P (Addendum)
Date: 11/04/2021               Patient Name:  Chelsea [name] MRN: [id number]  DOB: [birth year] Age / Sex: 70 y.o., female   PCP: Trey Sailors, PA         Medical Service: Internal Medicine Teaching Service         Attending Physician: Dr. Lucious Groves, DO    First Contact: Dr. Alvie Heidelberg Pager: [phone]  Second Contact: Dr. Allyson Sabal Pager: [phone]       After Hours (After 5p/  First Contact Pager: [phone]  weekends / holidays): Second Contact Pager: [phone]   Chief Complaint: Abdominal pain, constipation, vomitting  History of Present Illness:  Ms. Chelsea Barrientes. [name] is a 70 yo F with a history of HTN, HLD, GERD,  thoracic aortic aneurysm, mild aortic regurgitation, chronic pain on opioids, CKD, prior CVA and obesity presenting with abdominal pain.  Patient was in her usual state of health until Sunday. Constipated since Sunday with significant right sided abdominal pain. Described as a sharp to dull pain. Denies radiation of pain to the back, chest or any other part of the body. Pain did not wake her from her sleep. Endorses vomiting as well, no blood noted. She did have a BM yesterday with straining and 1 BM today w/o difficulty, formed stool. No blood in her stool that she noted. All of her symptoms began abruptly Sunday. She tried Miralax, dulcolax and other OTC supplements with minimal relief. No new prescription medications or changes in her medications. Denies recent changes in diet. She does take tramadol for her back pain, one pill daily as needed, denies overuse. She states when she sits for too long, her back hurts worse, prompting use of tramadol not exceeding one per day. Patient uses Tylenol as well for pain. Pain did not improve following BM. No recent hx of peptic ulcer disease, IBD, or diverticulitis/diverticulosis. She had a colonoscopy about 5 years ago, at that time she had polyps which were benign biopsy. She was to have a repeat in 5 years. No recent falls, or  trauma to the abdomen, lightheadedness, palpitations or chest pain. No recent travel or sick contacts. No fevers or chills.     Meds:  Current Meds  Medication Sig   amLODipine-valsartan (EXFORGE) 10-320 MG tablet Take 1 tablet by mouth daily.   aspirin EC 81 MG tablet Take 1 tablet (81 mg total) by mouth daily.   atorvastatin (LIPITOR) 10 MG tablet TAKE 1 TABLET BY MOUTH  DAILY (Patient taking differently: Take 10 mg by mouth at bedtime.)   chlorhexidine (PERIDEX) 0.12 % solution Use as directed 15 mLs in the mouth or throat 2 (two) times daily.   cholecalciferol (VITAMIN D) 25 MCG (1000 UNIT) tablet Take 1,000 Units by mouth daily.   citalopram (CELEXA) 10 MG tablet Take 10 mg by mouth daily.   cloNIDine (CATAPRES) 0.3 MG tablet Take 0.3 mg by mouth 2 (two) times daily.   cyclobenzaprine (FLEXERIL) 10 MG tablet Take 10 mg by mouth 3 (three) times daily as needed for muscle spasms (Arthritis in lower back).   doxazosin (CARDURA) 1 MG tablet Take 1 tablet (1 mg total) by mouth daily. (Patient taking differently: Take 1 mg by mouth at bedtime.)   ezetimibe (ZETIA) 10 MG tablet Take 10 mg by mouth daily.   gabapentin (NEURONTIN) 800 MG tablet Take 1 tablet (800 mg total) by mouth 2 (two) times daily.   indapamide (  LOZOL) 2.5 MG tablet Take 1 tablet (2.5 mg total) by mouth daily.   meclizine (ANTIVERT) 25 MG tablet Take 25 mg by mouth 3 (three) times daily as needed for dizziness or nausea (vertigo).   metoprolol succinate (TOPROL-XL) 100 MG 24 hr tablet TAKE 1 TABLET (100 MG TOTAL) BY MOUTH DAILY. TAKE WITH OR IMMEDIATELY FOLLOWING A MEAL.   mirabegron ER (MYRBETRIQ) 25 MG TB24 tablet Take 25 mg by mouth daily.   Multiple Vitamin (MULTIVITAMIN) tablet Take 1 tablet by mouth daily.     naproxen (NAPROSYN) 375 MG tablet Take 375 mg by mouth 2 (two) times daily as needed for mild pain or headache.   nicotine (NICODERM CQ - DOSED IN MG/24 HOURS) 21 mg/24hr patch PLACE 1 PATCH (21 MG TOTAL) ONTO THE  SKIN DAILY.   ondansetron (ZOFRAN) 8 MG tablet Take 8 mg by mouth 3 (three) times daily as needed for vomiting or nausea.   pantoprazole (PROTONIX) 40 MG tablet Take 40 mg by mouth daily.   spironolactone (ALDACTONE) 25 MG tablet Take 1 tablet (25 mg total) by mouth daily.   traMADol (ULTRAM) 50 MG tablet Take 50 mg by mouth every 6 (six) hours as needed for moderate pain.   vitamin B-12 (CYANOCOBALAMIN) 500 MCG tablet Take 500 mcg by mouth daily.   vitamin E 1000 UNIT capsule Take 1,000 Units by mouth daily.     Allergies: Allergies as of 11/04/2021 - Review Complete 11/04/2021  Allergen Reaction Noted   Other Swelling 08/09/2016   Hydrocodone Nausea And Vomiting 05/12/2021   Oxycodone Nausea And Vomiting 03/05/2020   Past Medical History:  Diagnosis Date   Anxiety    Aortic aneurysm without rupture [facility])    followed by dr dr h. Tamala Julian,  per echo 08/ 2021 18m and chest CT 03/ 2021 4.0.cm   Arthritis    lower back   BPPV (benign paroxysmal positional vertigo)    Chronic back pain    CKD (chronic kidney disease), stage II    Depression    GERD (gastroesophageal reflux disease)    Headache    Hemiparesis affecting left side as late effect of cerebrovascular accident ([facility]    Hiatal hernia    History of anemia    History of cerebrovascular accident (CVA) with residual deficit 03/2006   (neurologist--- dr aRexene Alberts admission in epic, acute right thalamic hypertensive hemorrhage w/ left sided hemiparesis   History of gastric ulcer 2003   w/ upper gi bleed,  egd with no intervention needed   Hyperlipidemia    Hypertension    followed by cardiology--- [facility]. [facility]   (nuclear stress test 01-31-20121 normal w/ ef 54%; cardaic cath 08-07-2010 no sig coronary obstruction, preserved LVF, mild AR   Multinodular goiter    followed by pcp,  last ultrasound in epic 03-20-2020 , never met critiria for bx   Nonrheumatic aortic (valve) insufficiency    followed by cardiologist---  dr h. 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  Denies alcohol use and illicit drug use.  Review of Systems: A complete ROS was negative except as per HPI.   Physical Exam: Blood pressure (!) 148/80, pulse (!) 108, temperature 97.9 F (36.6 C), resp. rate 19, SpO2 100 %. Physical Exam HENT:     Head: Normocephalic and atraumatic.  Cardiovascular:     Rate and Rhythm: Tachycardia present. Rhythm irregularly irregular.  Pulmonary:     Effort: Pulmonary effort is normal. No respiratory distress.     Breath sounds: Normal breath sounds.  Abdominal:     General: Abdomen is protuberant. Bowel sounds are decreased.     Palpations: Abdomen is rigid.     Tenderness: There is abdominal tenderness. There is guarding.  Musculoskeletal:     Right lower leg: No edema.     Left lower leg: No edema.  Skin:    General: Skin is warm and dry.  Neurological:     General: No focal deficit present.     Mental Status: She is alert and oriented to person, place, and time.  Psychiatric:        Behavior: Behavior is cooperative.     EKG: personally reviewed my interpretation is atrial tachycardia, LVH, prolonged QT interval  CT ABDOMEN PELVIS WO CONTRAST  Result Date: 11/04/2021 CLINICAL DATA:   Abdominal pain, acute, nonlocalized. Constipation, nausea, vomiting. EXAM: CT ABDOMEN AND PELVIS WITHOUT CONTRAST TECHNIQUE: Multidetector CT imaging of the abdomen and pelvis was performed following the standard protocol without IV contrast. COMPARISON:  None. FINDINGS: Lower chest: Mild bibasilar atelectasis.  Mild cardiomegaly. Hepatobiliary: Cholelithiasis without pericholecystic inflammatory change identified. Liver unremarkable. No intra or extrahepatic biliary ductal dilation. Pancreas: Unremarkable Spleen: Unremarkable Adrenals/Urinary Tract: Adrenal glands are unremarkable. Kidneys are normal, without renal calculi, focal lesion, or hydronephrosis. Bladder is unremarkable. Stomach/Bowel: There is extensive extraluminal gas noted interstitially within the wall of the distal sigmoid colon extending into the distal colonic mesentery as well as within the high rectum. Gas subsequently extends into the retroperitoneum surrounding the inferior vena cava and extending into the diaphragmatic hiatus and periportal region. The exact site of perforation is not clearly identified though extensive inflammatory change involving the distal sigmoid colon favors this to represent the primary site, best seen on image # 61/3. There is no free intraperitoneal gas identified. Multiple loops of dilated fluid-filled small bowel are identified demonstrating a gradual transition within the ileum most in keeping with a developing ileus. The appendix is normal. No free intraperitoneal fluid or loculated intra-abdominal fluid collections are identified. Vascular/Lymphatic: Aortic atherosclerosis. No enlarged abdominal or pelvic lymph nodes. Reproductive: Status post hysterectomy. No adnexal masses. Other: Small bilateral fat containing inguinal hernias are present. Musculoskeletal: No acute bone abnormality. IMPRESSION: Extensive extraluminal gas noted interstitially within the distal sigmoid colon and rectum with extension of gas  into the retroperitoneum and into the colonic mesentery. The site of enteric perforation is not clearly identified, however, extensive inflammatory change suggests this is within the distal sigmoid colon. Developing ileus noted. No free intraperitoneal gas. No loculated intra-abdominal fluid collections. Cholelithiasis. Mild cardiomegaly. Electronically Signed   By: Linwood Dibbles.D.  On: 11/04/2021 19:15     Assessment & Plan by Problem: Principal Problem:   Perforated diverticulum Active Problems:   Hypokalemia   Hypertension   Atrial fibrillation with RVR (HCC)   AKI (acute kidney injury) ([place])   Ms. Donzetta Matters. Javid is a 70 yo F with a history of HTN, HLD, GERD,  thoracic aortic aneurysm, mild aortic regurgitation, chronic pain on opioids, CKD, prior CVA and obesity presenting with abdominal pain and admitted for perforated diverticulitis.  Perforated Diverticulitis Patient presented complaining of severe right-sided abdominal pain for the last 3 days.  Patient reports associated constipation with straining on attempt to defecate, 3 episodes of vomiting yesterday and one episode of vomiting today.  Patient has tried over-the-counter remedies such as MiraLAX, Dulcolax, and Linzess for relief with minimal improvement.  Last known baseline 10/31/2021.  Patient denies any recent falls or trauma to the abdomen.  Patient takes tramadol for chronic back pain and endorses she only takes 1 pill a day, denies increased use of her pain medication.  Patient denies any recent changes to her current home medications or recent changes to her diet.  She denies any hematochezia, hematemesis or melena.  Last colonoscopy was 5 years ago, colonoscopy was incomplete and a single column barium enema was performed.  Results revealed mildly tortuous sigmoid colon otherwise unremarkable.  Patient has no prior history of IBD or diverticulosis/diverticulitis per chart review.  CBC significant for leukocytosis at 15.3.  BMP  significant for electrolyte imbalance with hypokalemia at 2.7.  Magnesium levels were within normal limits.  Lipase within normal limits. Vitals significant for tachycardia up to 113 bpm; normotensive, saturating well >99% and respiratory rate within normal limits.  Patient meets SIRS criteria with tachycardia and leukocytosis.  Sepsis protocol was initiated by ED.  CT of the abdomen and pelvis reveals extensive extraluminal gas noted within the distal sigmoid colon and extensive inflammatory change suggestive of perforation of the distal sigmoid colon.  No free intraperitoneal gas or loculated intra-abdominal fluid collection noted.  Surgery was consulted and exploratory laparotomy was suggested.  Patient and family are highly in favor of proceeding with a trial of IV antibiotics and monitoring prior to proceeding with surgery.  Patient started on empiric antibiotics Zosyn.  On physical exam, patient appeared well given current circumstances.  She endorses last bowel movement this morning without straining and formed stool.  She is afebrile, however she is hypertensive and in A. fib with RVR, likely in the setting of acute pain.  Patient received IV fentanyl 50 mcg 1 dose for pain and 1L bolus IV NaCl. --Surgery recommendations appreciated --IV Zosyn 3.375 g at 12.5 mL/h every 8 hours --IV LR continuous 75 mL/h for 12 hours --Zofran every 6 hours as needed for nausea vomiting --IV Dilaudid .36m every 4 hours as needed for moderate pain, titrate up as needed --Tylenol 650 mg every 6 hours as needed for mild pain/fever --Follow-up blood cultures --Trend CBC --Trend BMP --Continue to monitor for deterioration over the next 12 to 24 hours --Potential for surgery if patient's clinical condition worsens --Continue telemetry --Diet n.p.o.  .  However, EKG did not pick up findings; however showed atrial tachycardia with LVH. Cardiac changes likely in the setting of acute pain secondary to perforated  diverticulitis.  Patient denies chest pain, palpitations or significant cardiac history.  Patient has remote history of CVA.  Patient takes a daily 81 mg ASA.  No other anticoagulation or antiplatelet therapy.  Last echo August 2021  revealed ejection fraction at 60 to 65% with LV normal function and no left ventricular wall motion abnormalities.  Mild left ventricular hypertrophy with grade 1 DD.  Mild MR, TR, and AR noted.  AKI on CKD Initial labs significant for elevated creatinine levels at 1.96.  Baseline creatinine 1.3-1.4.  AKI likely in the setting of dehydration.  Patient has been vomiting for the last several days.  Patient denies dysuria, hematuria or recent urinary changes.  Patient received 1 L bolus since admission. --Continue IV LR 75 mL/h continuous for 12 hours --UA pending --Avoid nephrotoxic agents --Trend BMP  # Resistant Hypertension #Aortic anerurysm Patient has a history of resistant hypertension; currently on 5 agent medication regimen including amlodipine-valsartan 10- 320 mg; clonidine 0.3 mg twice daily, doxazosin 1 mg daily, Lozol 2.5 mg daily, Toprol 100 mg daily, and aldactone.  Patient will benefit from outpatient evaluation with sleep study to rule out OSA; renal angiogram; primary hyperaldosteronism versus Pheochromocytoma work-up.  Patient was last seen by her cardiologist 10/06/2021, blood pressure was controlled at that time.  Chest CT obtained March 2021 confirmed thoracic aortic aneurysm measuring 4 cm in diameter.  It was recommended to follow-up annually for CT of the chest; patient is due for repeat chest CT.  Patient's blood pressure is acceptable SBP <150.  Medications held in the setting of diet n.p.o. with potential abdominal surgery.There is concern for rebound hypertension, given patient is on clonidine at home. If patient becomes hypertensive, consider adding hypertensive agents slowly --Pharmacy consulted for medication reconciliation --Patient is relatively  normotensive; hold all antihypertensive agents --Can initiate hydralazine if patient becomes hypertensive  Hypokalemia Patient was found to be hypokalemic at 2.7 on initial labs.  Likely in the setting of perforated diverticulitis.  Patient received 40 mEq potassium tabs for repletion.  And an additional 2 doses of IV 10 mEq potassium chloride for repletion.  Magnesium was within normal limits.  However, patient did receive a dose of magnesium sulfate prior to lab collection. --Trend potassium levels; replete if needed    Dispo: Admit patient to  Signed: Timothy Lasso, MD 11/04/2021, 10:04 PM  Pager: [phone] After 5pm on weekdays and 1pm on weekends: On Call pager: [phone]

## 2021-11-04 NOTE — ED Provider Notes (Signed)
Emergency Medicine Provider Triage Evaluation Note  Chelsea [name] , a 70 y.o. female  was evaluated in triage.  Pt complains of gradual onset, constant, diffuse, abdominal pain for the past 4 to 5 days.  She also complains of nausea and nonbloody nonbilious emesis as well as constipation.  She has not had a BM in 3 days.  She has tried multiple over-the-counter remedies for same without relief.  She is still passing a small amount of gas.  She denies any previous abdominal surgeries.  Denies any recent sick contacts.  Review of Systems  Positive: + abd pain, nausea, vomiting, constipation Negative: - fevers  Physical Exam  BP 135/90   Pulse (!) 106   Temp 97.6 F (36.4 C) (Oral)   Resp 16   SpO2 95%  Gen:   Awake, no distress   Resp:  Normal effort  MSK:   Moves extremities without difficulty  Other:  Decreased BS. Abd soft and nontender.   Medical Decision Making  Medically screening exam initiated at 1:35 PM.  Appropriate orders placed.  Alora Gorey was informed that the remainder of the evaluation will be completed by another provider, this initial triage assessment does not replace that evaluation, and the importance of remaining in the ED until their evaluation is complete.     Eustaquio Maize, PA-C 11/04/21 Oxly A, DO 11/04/21 1642

## 2021-11-04 NOTE — ED Triage Notes (Signed)
Patient here with complaint of constipation, nausea, and vomiting that started 11/01/2021. Patient alert, oriented, and in no apparent distress at this time.

## 2021-11-04 NOTE — Anesthesia Preprocedure Evaluation (Deleted)
Anesthesia Evaluation    Reviewed: Allergy & Precautions, Patient's Chart, lab work & pertinent test results  Airway        Dental   Pulmonary Current Smoker and Patient abstained from smoking.,           Cardiovascular hypertension, Pt. on medications and Pt. on home beta blockers + Peripheral Vascular Disease (TAA 32mm)  + dysrhythmias Atrial Fibrillation + Valvular Problems/Murmurs AI      Neuro/Psych  Headaches, PSYCHIATRIC DISORDERS Anxiety Depression CVA, Residual Symptoms    GI/Hepatic Neg liver ROS, hiatal hernia, GERD  Medicated,Diverticulitis    Endo/Other  negative endocrine ROS  Renal/GU Renal InsufficiencyRenal disease (AKI, hypokalemia)     Musculoskeletal  (+) Arthritis ,   Abdominal   Peds  Hematology negative hematology ROS (+)   Anesthesia Other Findings   Reproductive/Obstetrics                             Anesthesia Physical Anesthesia Plan  ASA: 3 and emergent  Anesthesia Plan: General   Post-op Pain Management: Toradol IV (intra-op)   Induction: Intravenous  PONV Risk Score and Plan: 3 and Dexamethasone, Ondansetron and Treatment may vary due to age or medical condition  Airway Management Planned: Oral ETT  Additional Equipment: Arterial line  Intra-op Plan:   Post-operative Plan: Possible Post-op intubation/ventilation  Informed Consent:   Plan Discussed with:   Anesthesia Plan Comments:         Anesthesia Quick Evaluation

## 2021-11-04 NOTE — Progress Notes (Signed)
Pharmacy Antibiotic Note  Chelsea [name] is a 70 y.o. female admitted on 11/04/2021 with  colonic perforation .  Pharmacy has been consulted for Zosyn dosing.  WBC is elevated. SCr above baseline of 1.96  Plan: -Zosyn 3.375 gm IV Q 8 hours (EI infusion) -Monitor CBC, renal fx, cultures and clinical progress     Temp (24hrs), Avg:97.8 F (36.6 C), Min:97.6 F (36.4 C), Max:97.9 F (36.6 C)  Recent Labs  Lab 11/04/21 1318  WBC 15.3*  CREATININE 1.96*    CrCl cannot be calculated (Unknown ideal weight.).    Allergies  Allergen Reactions   Other Swelling    Hair dye, caused eye swelling, multiple times experienced this reaction   Hydrocodone Nausea And Vomiting   Oxycodone Nausea And Vomiting    Antimicrobials this admission: Zosyn 12/7 >>   Dose adjustments this admission:   Microbiology results: 12/7 BCx >>   Thank you for allowing pharmacy to be a part of this patient's care.  Albertina Parr, PharmD., BCPS, BCCCP Clinical Pharmacist Please refer to [facility] for unit-specific pharmacist

## 2021-11-04 NOTE — ED Provider Notes (Signed)
[facility] EMERGENCY DEPARTMENT Provider Note   CSN: [id number] Arrival date & time: 11/04/21  1217     History Chief Complaint  Patient presents with   Abdominal Pain    Chelsea [name] is a 70 y.o. female.  70 y/o female with hx of HTN, HLD, CVA, thoracic aortic aneurysm (under surveillance), CKD presents to the emergency department for complaints of abdominal pain.  Abdominal pain has been gradual in onset, constant, diffuse.  Symptoms present over the past 4 to 5 days.  She has had associated nausea with sporadic vomiting.  Reports 2 episodes of emesis in the past 48 hours.  Emesis nonbloody, nonbilious.  Has been more constipated, but did have a small bowel movement this morning.  She describes her bowel movement as "looser" than normal.  She has been passing small amounts of flatus.  Has tried multiple over-the-counter remedies without relief of her discomfort.  No urinary symptoms, fever, sick contacts.  Denies prior abdominal surgeries.  The history is provided by the patient and the spouse. No language interpreter was used.  Abdominal Pain     Past Medical History:  Diagnosis Date   Anxiety    Aortic aneurysm without rupture [facility])    followed by dr dr h. 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 [facility]   (nuclear stress test 01-31-20121 normal w/ ef 54%; cardaic cath 08-07-2010 no sig coronary obstruction, preserved LVF, mild AR   Multinodular goiter    followed by pcp,  last ultrasound in epic 03-20-2020 , never met critiria for bx   Nonrheumatic aortic (valve) insufficiency    followed by cardiologist--- dr h. 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,  followed by dr atRexene Alberts Urge incontinence    Wears glasses    Wears hearing aid in both ears    Wears partial dentures    upper and lower    Patient Active Problem List   Diagnosis Date Noted   54 04/03/2020   Nutritional counseling 01/19/2018   Primary osteoarthritis of left knee 01/03/2016   Mild to Moderate Aortic Insufficiency    Hypertension    Hyperlipidemia    [place]   Pulmonary  hypertension ([place]) 01/30/2013   Orthostatic hypotension 12/18/2012   Aortic valve disorder 07/29/2010   CHEST PAIN-PRECORDIAL 07/29/2010   CYST OF THYROID 01/06/2010   HYPOKALEMIA 01/06/2010   HYPERLIPIDEMIA 12/24/2009   OBESITY 12/24/2009   HYPERTENSION, UNSPECIFIED 12/24/2009   Personal history of unspecified circulatory disease 12/24/2009   PEPTIC ULCER DISEASE, HX OF 12/24/2009    Past Surgical History:  Procedure Laterality Date   ABDOMINAL HYSTERECTOMY     CARDIAC CATHETERIZATION  08/07/2010   @ [place]  dr Lia Foyer---  no sig coronary obstruction, preserved LVF, mild AR   MASS EXCISION N/A 06/04/2021   Procedure: EXCISION SUCUTANEOUS MASS X2, UPPER BACK;  Surgeon: Clovis Riley, MD;  Location: [place];  Service:  General;  Laterality: N/A;   TOTAL KNEE ARTHROPLASTY Left 01/05/2016   Procedure: TOTAL KNEE ARTHROPLASTY;  Surgeon: Frederik Pear, MD;  Location: [place];  Service: Orthopedics;  Laterality: Left;   TUBAL LIGATION Bilateral 2000     OB History   No obstetric history on file.     Family History  Problem Relation Age of Onset   Hypertension Mother        deceased   Peripheral vascular disease Mother    Cirrhosis Father        DeCEASED   Heart attack Other        grandmother deceased   Cancer Brother    Drug abuse Brother        overdose, in 1 brother   Breast cancer Neg Hx     Social History   Tobacco Use   Smoking status: Every Day    Packs/day: 1.00    Years: 50.00    Pack years: 50.00    Types: Cigarettes   Smokeless tobacco: Never  Vaping Use   Vaping Use: Never used  Substance Use Topics   Alcohol use: No    Alcohol/week: 0.0 standard drinks   Drug use: No    Home Medications Prior to Admission medications   Medication Sig Start Date End Date Taking? Authorizing Provider  amLODipine-valsartan (EXFORGE) 10-320 MG tablet Take 1 tablet by mouth daily.    [provider]  aspirin EC 81 MG tablet Take 1 tablet (81 mg total) by mouth daily. 04/11/17   Belva Crome, MD  atorvastatin (LIPITOR) 10 MG tablet TAKE 1 TABLET BY MOUTH  DAILY 10/08/21   Belva Crome, MD  chlorhexidine (PERIDEX) 0.12 % solution Use as directed 15 mLs in the mouth or throat 2 (two) times daily. 05/29/20   [provider]  citalopram (CELEXA) 10 MG tablet Take 10 mg by mouth daily.    [provider]  cloNIDine (CATAPRES) 0.3 MG tablet Take 0.3 mg by mouth 2 (two) times daily. 02/20/21   [provider]  cyclobenzaprine (FLEXERIL) 10 MG tablet Take 10 mg by mouth 3 (three) times daily as needed (Arthritis in lower back).    [provider]  cyclobenzaprine (FLEXERIL) 10 MG tablet Take 1 tablet by mouth 3 (three) times daily. 06/11/21   [provider]  doxazosin (CARDURA) 1 MG tablet Take 1 tablet (1 mg total) by mouth daily. 10/06/21   Belva Crome, MD  ezetimibe (ZETIA) 10 MG tablet Take 10 mg by mouth daily.    [provider]  gabapentin (NEURONTIN) 800 MG tablet Take 1 tablet (800 mg total) by mouth 2 (two) times daily. 08/11/21   Ward Givens, NP  indapamide (LOZOL) 2.5 MG tablet Take 1 tablet (2.5  mg total) by mouth daily. 10/07/20   Belva Crome, MD  meclizine (ANTIVERT) 25 MG tablet Take 25 mg by mouth 3 (three) times daily as needed for dizziness or nausea (vertigo). 05/14/20   [provider]  methocarbamol (ROBAXIN) 500 MG tablet Take 500 mg by mouth 3 (three) times daily as needed. 06/17/20   [provider]  metoprolol succinate (TOPROL-XL) 100 MG 24 hr tablet TAKE 1 TABLET (100 MG TOTAL) BY MOUTH DAILY. TAKE WITH OR IMMEDIATELY FOLLOWING A MEAL. 08/20/21   Belva Crome, MD  mirabegron ER (MYRBETRIQ) 25 MG TB24 tablet Take 25 mg by mouth daily.    [provider]  Multiple Vitamin (MULTIVITAMIN) tablet Take 1 tablet by mouth daily.      [provider]  naproxen (NAPROSYN) 375 MG tablet Take 375 mg by mouth as needed. 06/24/21   [provider]  nicotine (NICODERM CQ - DOSED IN MG/24 HOURS) 21 mg/24hr patch PLACE 1 PATCH (21 MG TOTAL) ONTO THE SKIN DAILY. 10/01/21   Belva Crome, MD  ondansetron (ZOFRAN) 8 MG tablet Take 8 mg by mouth 3 (three) times daily as needed. 08/31/21   [provider]  pantoprazole (PROTONIX) 40 MG tablet Take 40 mg by mouth daily. 07/28/16   [provider]  spironolactone (ALDACTONE) 25 MG tablet Take 1 tablet (25 mg total) by mouth daily. 10/06/21   Belva Crome, MD  traMADol (ULTRAM) 50 MG tablet Take 50 mg by mouth as needed. 06/11/21   [provider]  vitamin B-12 (CYANOCOBALAMIN) 500 MCG tablet Take 500 mcg by mouth daily.    [provider]    Allergies    Other, Hydrocodone, and  Oxycodone  Review of Systems   Review of Systems  Gastrointestinal:  Positive for abdominal pain.  Ten systems reviewed and are negative for acute change, except as noted in the HPI.    Physical Exam Updated Vital Signs BP (!) 150/94   Pulse (!) 113   Temp 97.9 F (36.6 C)   Resp 19   SpO2 98%   Physical Exam Vitals and nursing note reviewed.  Constitutional:      General: She is not in acute distress.    Appearance: She is well-developed. She is not diaphoretic.     Comments: Nontoxic appearing, in NAD.  HENT:     Head: Normocephalic and atraumatic.  Eyes:     General: No scleral icterus.    Conjunctiva/sclera: Conjunctivae normal.  Cardiovascular:     Comments: Regularly irregular. Bigeminy noted on monitor. Pulmonary:     Effort: Pulmonary effort is normal. No respiratory distress.     Comments: Respirations even and unlabored Abdominal:     Comments: Mildly distended, soft abdomen.  Generalized tenderness to palpation.  No focal tenderness or involuntary guarding.  Musculoskeletal:        General: Normal range of motion.     Cervical back: Normal range of motion.  Skin:    General: Skin is warm and dry.     Coloration: Skin is not pale.     Findings: No erythema or rash.  Neurological:     Mental Status: She is alert and oriented to person, place, and time.     Coordination: Coordination normal.     Comments: Moving all extremities spontaneously; symmetric.  Psychiatric:        Behavior: Behavior normal.    ED Results / Procedures / Treatments   Labs (all labs ordered are  listed, but only abnormal results are displayed) Labs Reviewed  COMPREHENSIVE METABOLIC PANEL - Abnormal; Notable for the following components:      Result Value   Potassium 2.7 (*)    Chloride 91 (*)    Glucose, Bld 135 (*)    BUN 38 (*)    Creatinine, Ser 1.96 (*)    Albumin 2.9 (*)    AST 14 (*)    GFR, Estimated 27 (*)    Anion gap 18 (*)    All other components within normal  limits  CBC - Abnormal; Notable for the following components:   WBC 15.3 (*)    RBC 5.16 (*)    Hemoglobin 15.1 (*)    HCT 46.4 (*)    All other components within normal limits  RESP PANEL BY RT-PCR (FLU A&B, COVID) ARPGX2  CULTURE, BLOOD (ROUTINE X 2)  CULTURE, BLOOD (ROUTINE X 2)  LIPASE, BLOOD  MAGNESIUM  URINALYSIS, ROUTINE W REFLEX MICROSCOPIC  LACTIC ACID, PLASMA    EKG EKG Interpretation  Date/Time:  Wednesday November 04 2021 17:12:53 EST Ventricular Rate:  120 PR Interval:  158 QRS Duration: 104 QT Interval:  351 QTC Calculation: 410 R Axis:   -34 Text Interpretation: Sinus tachycardia Supraventricular bigeminy Left atrial enlargement LVH with secondary repolarization abnormality bigeminy new since prevous Confirmed by Charlesetta Shanks [phone]) on 11/04/2021 5:20:09 PM    EKG Interpretation  Date/Time:  Wednesday November 04 2021 18:19:57 EST Ventricular Rate:  144 PR Interval:  72 QRS Duration: 97 QT Interval:  349 QTC Calculation: 541 R Axis:   -22 Text Interpretation: Sinus or ectopic atrial tachycardia LVH with secondary repolarization abnormality Inferior infarct, acute (LCx) Prolonged QT interval suspect atrial flutter. significant rate increase since previous tracing. Confirmed by Charlesetta Shanks [phone]) on 11/04/2021 6:37:09 PM        Radiology CT ABDOMEN PELVIS WO CONTRAST  Result Date: 11/04/2021 CLINICAL DATA:  Abdominal pain, acute, nonlocalized. Constipation, nausea, vomiting. EXAM: CT ABDOMEN AND PELVIS WITHOUT CONTRAST TECHNIQUE: Multidetector CT imaging of the abdomen and pelvis was performed following the standard protocol without IV contrast. COMPARISON:  None. FINDINGS: Lower chest: Mild bibasilar atelectasis.  Mild cardiomegaly. Hepatobiliary: Cholelithiasis without pericholecystic inflammatory change identified. Liver unremarkable. No intra or extrahepatic biliary ductal dilation. Pancreas: Unremarkable Spleen: Unremarkable Adrenals/Urinary  Tract: Adrenal glands are unremarkable. Kidneys are normal, without renal calculi, focal lesion, or hydronephrosis. Bladder is unremarkable. Stomach/Bowel: There is extensive extraluminal gas noted interstitially within the wall of the distal sigmoid colon extending into the distal colonic mesentery as well as within the high rectum. Gas subsequently extends into the retroperitoneum surrounding the inferior vena cava and extending into the diaphragmatic hiatus and periportal region. The exact site of perforation is not clearly identified though extensive inflammatory change involving the distal sigmoid colon favors this to represent the primary site, best seen on image # 61/3. There is no free intraperitoneal gas identified. Multiple loops of dilated fluid-filled small bowel are identified demonstrating a gradual transition within the ileum most in keeping with a developing ileus. The appendix is normal. No free intraperitoneal fluid or loculated intra-abdominal fluid collections are identified. Vascular/Lymphatic: Aortic atherosclerosis. No enlarged abdominal or pelvic lymph nodes. Reproductive: Status post hysterectomy. No adnexal masses. Other: Small bilateral fat containing inguinal hernias are present. Musculoskeletal: No acute bone abnormality. IMPRESSION: Extensive extraluminal gas noted interstitially within the distal sigmoid colon and rectum with extension of gas into the retroperitoneum and into the colonic mesentery. The site of enteric perforation  is not clearly identified, however, extensive inflammatory change suggests this is within the distal sigmoid colon. Developing ileus noted. No free intraperitoneal gas. No loculated intra-abdominal fluid collections. Cholelithiasis. Mild cardiomegaly. Electronically Signed   By: Fidela Salisbury M.D.   On: 11/04/2021 19:15    Procedures .Critical Care Performed by: Antonietta Breach, PA-C Authorized by: Antonietta Breach, PA-C   Critical care provider statement:     Critical care time (minutes):  45   Critical care was necessary to treat or prevent imminent or life-threatening deterioration of the following conditions:  Sepsis and metabolic crisis (bowel perforation; Afib RVR; hypoK)   Critical care was time spent personally by me on the following activities:  Development of treatment plan with patient or surrogate, discussions with consultants, evaluation of patient's response to treatment, examination of patient, ordering and review of laboratory studies, ordering and review of radiographic studies, ordering and performing treatments and interventions, pulse oximetry, re-evaluation of patient's condition, review of old charts and obtaining history from patient or surrogate   I assumed direction of critical care for this patient from another provider in my specialty: no     Care discussed with: admitting provider     Medications Ordered in ED Medications  potassium chloride 10 mEq in 100 mL IVPB (10 mEq Intravenous New Bag/Given 11/04/21 1902)  magnesium sulfate IVPB 2 g 50 mL (2 g Intravenous New Bag/Given 11/04/21 1904)  fentaNYL (SUBLIMAZE) injection 50 mcg (has no administration in time range)  sodium chloride 0.9 % bolus 1,000 mL (1,000 mLs Intravenous New Bag/Given 11/04/21 1758)  ondansetron (ZOFRAN) injection 4 mg (4 mg Intravenous Given 11/04/21 1758)  potassium chloride SA (KLOR-CON M) CR tablet 40 mEq (40 mEq Oral Given 11/04/21 1905)    ED Course  I have reviewed the triage vital signs and the nursing notes.  Pertinent labs & imaging results that were available during my care of the patient were reviewed by me and considered in my medical decision making (see chart for details).  Clinical Course as of 11/04/21 2019  Wed Nov 04, 2021  1442 Potassium(!!): 2.7 [MV]  1825 Patient appears to have episodes of atrial flutter on the monitor with rate up into the 140s.  She will then transition back into bigeminy.  Will order additional oral potassium as  well as IV magnesium given that magnesium level is presently pending.  Will attempt to avoid any additional QT prolonging agents given repeat EKG. [GY]  6948 Radiology called with CT read for patient. Per MD Christa See, CT shows extraluminal gas; suspected perforation in distal sigmoid colon. No recent hx of instrumentation per chart review. [KH]  1928 Consult placed to general surgery; pending call back. Patient informed of diagnosis and need for admission. [KH]  1939 Dr. Dema Severin of general surgery to come and assess the patient in the emergency department.  Anticipates operative management.  Requests admission to a medical service given Afib w/RVR, chronic comorbidities. [place] Patient to be admitted by the internal medicine teaching service. [KH]    Clinical Course User Index [KH] Antonietta Breach, PA-C [MV] Eustaquio Maize, PA-C   MDM Rules/Calculators/A&P                           70 year old female presents to the emergency department for evaluation of 4 to 5 days of abdominal pain.  Her CT today is notable for an extensive amount of extraluminal air, mostly in the retroperitoneum.  Findings concerning  for distal sigmoid perforation.  Cause of perforation is unclear as patient denies any recent instrumentation or insertive anal practices.  Leukocytosis favored secondary to stress of perforation.  She is afebrile with stable blood pressures.  No hypotension.  Given IV fluids as well as IV Zosyn.  Will add lactate as well as blood cultures.  Patient found to be in A. fib with RVR, supportively managed with IVF given bowel perforation. Potassium repletion started along with IV MgSO4. Patient to be assessed in consultation by general surgery. IM teaching service to admit.  Vitals:   11/04/21 1800 11/04/21 1815 11/04/21 1830 11/04/21 2000  BP: (!) 147/70 (!) 164/117 (!) 150/94 (!) 148/80  Pulse: (!) 52 (!) 50 (!) 113 (!) 108  Resp: 20 (!) 22 19 19   Temp:      TempSrc:      SpO2: 96% 96% 98% 100%     Final Clinical Impression(s) / ED Diagnoses Final diagnoses:  Bowel perforation ([place])  Atrial fibrillation with RVR ([place])  Hypokalemia    Rx / DC Orders ED Discharge Orders     None        Antonietta Breach, PA-C 11/04/21 2023    Charlesetta Shanks, MD 11/06/21 1557

## 2021-11-05 DIAGNOSIS — K5780 Diverticulitis of intestine, part unspecified, with perforation and abscess without bleeding: Secondary | ICD-10-CM

## 2021-11-05 DIAGNOSIS — E876 Hypokalemia: Secondary | ICD-10-CM

## 2021-11-05 DIAGNOSIS — K631 Perforation of intestine (nontraumatic): Secondary | ICD-10-CM | POA: Diagnosis present

## 2021-11-05 LAB — BASIC METABOLIC PANEL
Anion gap: 11 (ref 5–15)
Anion gap: 11 (ref 5–15)
BUN: 38 mg/dL — ABNORMAL HIGH (ref 8–23)
BUN: 24 mg/dL — ABNORMAL HIGH (ref 8–23)
CO2: 26 mmol/L (ref 22–32)
CO2: 28 mmol/L (ref 22–32)
Calcium: 8.6 mg/dL — ABNORMAL LOW (ref 8.9–10.3)
Calcium: 8.7 mg/dL — ABNORMAL LOW (ref 8.9–10.3)
Chloride: 98 mmol/L (ref 98–111)
Chloride: 98 mmol/L (ref 98–111)
Creatinine, Ser: 1.74 mg/dL — ABNORMAL HIGH (ref 0.44–1.00)
Creatinine, Ser: 1.23 mg/dL — ABNORMAL HIGH (ref 0.44–1.00)
GFR, Estimated: 31 mL/min — ABNORMAL LOW (ref >60)
GFR, Estimated: 48 mL/min — ABNORMAL LOW (ref >60)
Glucose, Bld: 100 mg/dL — ABNORMAL HIGH (ref 70–99)
Glucose, Bld: 84 mg/dL (ref 70–99)
Potassium: 3 mmol/L — ABNORMAL LOW (ref 3.5–5.1)
Potassium: 3 mmol/L — ABNORMAL LOW (ref 3.5–5.1)
Sodium: 135 mmol/L (ref 135–145)
Sodium: 137 mmol/L (ref 135–145)

## 2021-11-05 LAB — CBC WITH DIFFERENTIAL/PLATELET
Abs Immature Granulocytes: 0.05 10*3/uL (ref 0.00–0.07)
Basophils Absolute: 0 10*3/uL (ref 0.0–0.1)
Basophils Relative: 0 %
Eosinophils Absolute: 0.1 10*3/uL (ref 0.0–0.5)
Eosinophils Relative: 1 %
HCT: 41.2 % (ref 36.0–46.0)
Hemoglobin: 13.6 g/dL (ref 12.0–15.0)
Immature Granulocytes: 0 %
Lymphocytes Relative: 6 %
Lymphs Abs: 0.7 10*3/uL (ref 0.7–4.0)
MCH: 29.7 pg (ref 26.0–34.0)
MCHC: 33 g/dL (ref 30.0–36.0)
MCV: 90 fL (ref 80.0–100.0)
Monocytes Absolute: 0.9 10*3/uL (ref 0.1–1.0)
Monocytes Relative: 7 %
Neutro Abs: 11.1 10*3/uL — ABNORMAL HIGH (ref 1.7–7.7)
Neutrophils Relative %: 86 %
Platelets: 279 10*3/uL (ref 150–400)
RBC: 4.58 MIL/uL (ref 3.87–5.11)
RDW: 13.9 % (ref 11.5–15.5)
WBC: 12.9 10*3/uL — ABNORMAL HIGH (ref 4.0–10.5)
nRBC: 0 % (ref 0.0–0.2)

## 2021-11-05 LAB — HIV ANTIBODY (ROUTINE TESTING W REFLEX): HIV Screen 4th Generation wRfx: NONREACTIVE

## 2021-11-05 LAB — MAGNESIUM: Magnesium: 2.1 mg/dL (ref 1.7–2.4)

## 2021-11-05 LAB — POTASSIUM: Potassium: 2.9 mmol/L — ABNORMAL LOW (ref 3.5–5.1)

## 2021-11-05 MED ORDER — POTASSIUM CHLORIDE 10 MEQ/100ML IV SOLN
10.0000 meq | INTRAVENOUS | Status: AC
  Administered 2021-11-05 (×4): 10 meq via INTRAVENOUS
  Filled 2021-11-05 (×4): qty 100

## 2021-11-05 MED ORDER — TRAMADOL HCL 50 MG PO TABS
50.0000 mg | ORAL_TABLET | Freq: Four times a day (QID) | ORAL | Status: DC | PRN
  Administered 2021-11-05: 50 mg via ORAL
  Filled 2021-11-05: qty 1

## 2021-11-05 MED ORDER — POTASSIUM CHLORIDE IN NACL 20-0.9 MEQ/L-% IV SOLN
INTRAVENOUS | Status: DC
  Filled 2021-11-05 (×2): qty 1000

## 2021-11-05 MED ORDER — CLONIDINE HCL 0.2 MG/24HR TD PTWK
0.2000 mg | MEDICATED_PATCH | TRANSDERMAL | Status: DC
  Administered 2021-11-05 – 2021-11-12 (×2): 0.2 mg via TRANSDERMAL
  Filled 2021-11-05 (×3): qty 1

## 2021-11-05 MED ORDER — LABETALOL HCL 5 MG/ML IV SOLN
5.0000 mg | Freq: Once | INTRAVENOUS | Status: AC
  Administered 2021-11-05: 5 mg via INTRAVENOUS
  Filled 2021-11-05: qty 4

## 2021-11-05 MED ORDER — LABETALOL HCL 5 MG/ML IV SOLN
5.0000 mg | INTRAVENOUS | Status: DC | PRN
  Administered 2021-11-05 – 2021-11-06 (×5): 5 mg via INTRAVENOUS
  Filled 2021-11-05 (×3): qty 4

## 2021-11-05 NOTE — Progress Notes (Signed)
Pt arrived to unit from  ED A/O x 4,  CCMD called ,CHG given, pt oriented to unit,Will continue to monitor. Pt has HX of CVA in past with left side weakness and left facial droop  Phoebe Sharps, RN    11/05/21 1822  Vitals  Temp 98.2 F (36.8 C)  Temp Source Oral  BP (!) 178/90  MAP (mmHg) 112  BP Location Left Arm  BP Method Automatic  Patient Position (if appropriate) Lying  Pulse Rate 66  Pulse Rate Source Monitor  ECG Heart Rate 99  Resp (!) 22  Level of Consciousness  Level of Consciousness Alert  Oxygen Therapy  SpO2 96 %  O2 Device Room Air  O2 Flow Rate (L/min) 0 L/min  MEWS Score  MEWS Temp 0  MEWS Systolic 0  MEWS Pulse 0  MEWS RR 1  MEWS LOC 0  MEWS Score 1  MEWS Score Color Green

## 2021-11-05 NOTE — ED Notes (Signed)
Pt placed on bedpan

## 2021-11-05 NOTE — ED Notes (Signed)
Pt provided with ice chips per request.

## 2021-11-05 NOTE — Progress Notes (Signed)
Progress Note  1 Day Post-Op  Subjective: Pt reports abdominal pain is about the same as presentation. Primarily in RLQ. She denies nausea and had a slightly loose BM this AM. No blood or mucus in BM that she noted. I spoke with her husband at bedside and son on the phone as well.   Objective: Vital signs in last 24 hours: Temp:  [97.6 F (36.4 C)-97.9 F (36.6 C)] 97.9 F (36.6 C) (12/07 1602) Pulse Rate:  [47-152] 80 (12/08 0645) Resp:  [12-24] 12 (12/08 0645) BP: (128-167)/(59-120) 145/79 (12/08 0645) SpO2:  [92 %-100 %] 96 % (12/08 0645)    Intake/Output from previous day: 12/07 0701 - 12/08 0700 In: 250 [IV Piggyback:250] Out: -  Intake/Output this shift: Total I/O In: 58 [IV Piggyback:50] Out: -   PE: General: pleasant, WD, overweight female who is laying in bed in NAD Heart: irregularly irregular, rate in the 90s Lungs: CTAB, no wheezes, rhonchi, or rales noted.  Respiratory effort nonlabored Abd: soft, generalized ttp with worst pain in RLQ, no rebound ttp, no involuntary or voluntary guarding, ND, +BS Neuro: Cranial nerves 2-12 grossly intact, sensation is normal throughout Psych: A&Ox3 with an appropriate affect.    Lab Results:  Recent Labs    11/04/21 1318 11/05/21 0300  WBC 15.3* 12.9*  HGB 15.1* 13.6  HCT 46.4* 41.2  PLT 309 279   BMET Recent Labs    11/04/21 1318 11/05/21 0300  NA 136 135  K 2.7* 3.0*  2.9*  CL 91* 98  CO2 27 26  GLUCOSE 135* 100*  BUN 38* 38*  CREATININE 1.96* 1.74*  CALCIUM 9.3 8.6*   PT/INR No results for input(s): LABPROT, INR in the last 72 hours. CMP     Component Value Date/Time   NA 135 11/05/2021 0300   NA 144 10/22/2020 1352   K 3.0 (L) 11/05/2021 0300   K 2.9 (L) 11/05/2021 0300   CL 98 11/05/2021 0300   CO2 26 11/05/2021 0300   GLUCOSE 100 (H) 11/05/2021 0300   BUN 38 (H) 11/05/2021 0300   BUN 16 10/22/2020 1352   CREATININE 1.74 (H) 11/05/2021 0300   CREATININE 1.11 (H) 04/20/2016 1409    CALCIUM 8.6 (L) 11/05/2021 0300   PROT 7.4 11/04/2021 1318   PROT 6.8 08/11/2020 1058   ALBUMIN 2.9 (L) 11/04/2021 1318   ALBUMIN 3.8 08/11/2020 1058   AST 14 (L) 11/04/2021 1318   ALT 11 11/04/2021 1318   ALKPHOS 93 11/04/2021 1318   BILITOT 0.5 11/04/2021 1318   BILITOT 0.4 08/11/2020 1058   GFRNONAA 31 (L) 11/05/2021 0300   GFRAA 42 (L) 10/22/2020 1352   Lipase     Component Value Date/Time   LIPASE 27 11/04/2021 1318       Studies/Results: CT ABDOMEN PELVIS WO CONTRAST  Result Date: 11/04/2021 CLINICAL DATA:  Abdominal pain, acute, nonlocalized. Constipation, nausea, vomiting. EXAM: CT ABDOMEN AND PELVIS WITHOUT CONTRAST TECHNIQUE: Multidetector CT imaging of the abdomen and pelvis was performed following the standard protocol without IV contrast. COMPARISON:  None. FINDINGS: Lower chest: Mild bibasilar atelectasis.  Mild cardiomegaly. Hepatobiliary: Cholelithiasis without pericholecystic inflammatory change identified. Liver unremarkable. No intra or extrahepatic biliary ductal dilation. Pancreas: Unremarkable Spleen: Unremarkable Adrenals/Urinary Tract: Adrenal glands are unremarkable. Kidneys are normal, without renal calculi, focal lesion, or hydronephrosis. Bladder is unremarkable. Stomach/Bowel: There is extensive extraluminal gas noted interstitially within the wall of the distal sigmoid colon extending into the distal colonic mesentery as well as within the  high rectum. Gas subsequently extends into the retroperitoneum surrounding the inferior vena cava and extending into the diaphragmatic hiatus and periportal region. The exact site of perforation is not clearly identified though extensive inflammatory change involving the distal sigmoid colon favors this to represent the primary site, best seen on image # 61/3. There is no free intraperitoneal gas identified. Multiple loops of dilated fluid-filled small bowel are identified demonstrating a gradual transition within the ileum  most in keeping with a developing ileus. The appendix is normal. No free intraperitoneal fluid or loculated intra-abdominal fluid collections are identified. Vascular/Lymphatic: Aortic atherosclerosis. No enlarged abdominal or pelvic lymph nodes. Reproductive: Status post hysterectomy. No adnexal masses. Other: Small bilateral fat containing inguinal hernias are present. Musculoskeletal: No acute bone abnormality. IMPRESSION: Extensive extraluminal gas noted interstitially within the distal sigmoid colon and rectum with extension of gas into the retroperitoneum and into the colonic mesentery. The site of enteric perforation is not clearly identified, however, extensive inflammatory change suggests this is within the distal sigmoid colon. Developing ileus noted. No free intraperitoneal gas. No loculated intra-abdominal fluid collections. Cholelithiasis. Mild cardiomegaly. Electronically Signed   By: Fidela Salisbury M.D.   On: 11/04/2021 19:15    Anti-infectives: Anti-infectives (From admission, onward)    Start     Dose/Rate Route Frequency Ordered Stop   11/05/21 0400  piperacillin-tazobactam (ZOSYN) IVPB 3.375 g        3.375 g 12.5 mL/hr over 240 Minutes Intravenous Every 8 hours 11/04/21 2018     11/04/21 2015  piperacillin-tazobactam (ZOSYN) IVPB 3.375 g        3.375 g 100 mL/hr over 30 Minutes Intravenous  Once 11/04/21 2009 11/04/21 2253        Assessment/Plan Perforated diverticulitis with retroperitoneal gas - relatively contained perforation without free fluid of abscess - WBC 12 from 16 - pt prefers trial of non-operative management with IV abx and bowel rest, but understands that if she fails to improve or worsens that she may require surgical intervention. She is aware that surgery would likely involve exploratory laparotomy, partial colectomy and colostomy.  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

## 2021-11-05 NOTE — Progress Notes (Addendum)
Subjective: Patient was seen at bedside during rounds this morning. Pt reports that her abdominal pain is intermittent and had one episode of sharp pain this morning. Pt states that she had on episode of non-bloody BM and emesis and has not had another episode since then. Pt denies chest pain, palpitation, fever, chills, nausea, or vomiting. No other complains or concerns at this time.   Objective:  Vital signs in last 24 hours: Vitals:   11/05/21 0415 11/05/21 0430 11/05/21 0445 11/05/21 0645  BP:    (!) 145/79  Pulse: 96 65 (!) 55 80  Resp: 18 (!) 22 (!) 21 12  Temp:      TempSrc:      SpO2: 95% 97% 96% 96%   General Physical Exam Constitutional: alert, resting in bed and conversing, in no acute distress HENT: normocephalic, atraumatic, mucous membranes moist Eyes: conjunctiva non-erythematous, extraocular movements intact Neck: supple Cardiovascular: tachycardic and regular rhythm, no m/r/g Pulmonary/Chest: normal work of breathing on room air, lungs clear to auscultation bilaterally Abdominal: distended, RLQ abdominal tenderness to palpation and with guarding, hypoactive BS. No signs of peritonitis. MSK: normal bulk and tone Neurological: alert & oriented x 3  Skin: warm and dry Psych: normal behavior, normal affect   Assessment/Plan:  Principal Problem:   Perforated diverticulum Active Problems:   Hypokalemia   Hypertension   Atrial fibrillation with RVR (HCC)   AKI (acute kidney injury) (HCC)   Chronic back pain   Chronic, continuous use of opioids  Chelsea [name] is a 70 yo F with a history of HTN, HLD, GERD,  thoracic aortic aneurysm, mild aortic regurgitation, chronic pain on opioids, CKD, prior CVA and obesity presenting with abdominal pain and admitted for perforated diverticulitis.  Sepsis likely secondary to perforated diverticulitis Pt remains tachycardic at HR in the 100s, respiratory rate in the 20s, and WBC trending down to 12.9 with perforated  diverticulitis. Pt continues to meet sepsis criteria at this time. Pt is amenable to continuing IV Zosyn for now and clinically monitoring for any worsening symptoms which may include new onset fever, chills, severe abdominal pain, or increased vomiting. If worsening, pt will be taken for an exploratory laparotomy. Otherwise, plan is to continue IV Zosyn -Surgery is following pt, appreciate recs. -Await blood cultures. -IV Dilaudid .5mg  every 4 hours as needed for moderate pain -Tylenol 650 mg every 6 hours as needed for mild pain/fever -Monitor CBC and BMP -Keep NPO for possible surgery  Atrial Tachycardia EKG reveals atrial tachycardia with LVH likely secondary to sepsis possibly due to perforated diverticulitis. Pt does not have a history of atrial fibrillation or history of arrhythmias. She denies any chest pain or palpations at this time.  -Continue telemetry  Resistant Hypertension Aortic anerurysm Patient has a history of resistant hypertension; currently on 5 agent medication regimen including amlodipine-valsartan 10- 320 mg; clonidine 0.3 mg twice daily, doxazosin 1 mg daily, Lozol 2.5 mg daily, Toprol 100 mg daily, and aldactone. Patient was last seen by her cardiologist 10/06/2021, blood pressure was controlled at that time.  Chest CT obtained March 2021 confirmed thoracic aortic aneurysm measuring 4 cm in diameter. There is concern for rebound hypertension, given patient is on clonidine at home.  -Current SBP in 180s and goal is to maintain SBP <180. -Will start 0.2 mg clonidine patch.  -Hold all other home HTN regimen.   Hypokalemia Initial K level of 2.7 followed by 2.9 after receiving 40 mEq potassium and 2 doses of IV  10 mEq potassium chloride for repletion. Magnesium was within normal limits. However, patient did receive a dose of magnesium sulfate prior to lab collection. Pt received another 20 mEq with IVF.  -Will recheck BMP and replete PRN     Best Practice: Diet:  NPO IVF: NS,75cc/hr VTE: Enoxaparin Code: Full  Signature: Claudean Kinds, [facility]  Pager: [phone] 7:54 AM, 11/05/2021

## 2021-11-05 NOTE — ED Notes (Signed)
ED TO INPATIENT HANDOFF REPORT  ED Nurse Name and Phone #: Denton Ar, RN [phone]  S Name/Age/Gender Chelsea [name] 70 y.o. female Room/Bed: 033C/033C  Code Status   Code Status: Full Code  Home/SNF/Other  Patient oriented to: self, place, time, and situation Is this baseline? Yes   Triage Complete: Triage complete  Chief Complaint Bowel perforation [facility]) [K63.1]  Triage Note Patient here with complaint of constipation, nausea, and vomiting that started 11/01/2021. Patient alert, oriented, and in no apparent distress at this time.   Allergies Allergies  Allergen Reactions   Other Swelling    Hair dye, caused eye swelling, multiple times experienced this reaction   Hydrocodone Nausea And Vomiting   Oxycodone Nausea And Vomiting    Level of Care/Admitting Diagnosis ED Disposition     ED Disposition  Admit   Condition  --   Comment  Hospital Area[facility] [[id number]]  Level of Care: Telemetry Medical [[id number]]  May admit patient to [facility] or [facility] if equivalent level of care is available:: Yes  Covid Evaluation: Confirmed COVID Negative  Date Laboratory Confirmed COVID Negative: 11/04/2021  Diagnosis: Bowel perforation [facility]) [[id number]]  Admitting Physician: Silvio Pate  Attending Physician: Gust Rung [[id number]]  Estimated length of stay: past midnight tomorrow  Certification:: I certify this patient will need inpatient services for at least 2 midnights          B Medical/Surgery History Past Medical History:  Diagnosis Date   Anxiety    Aortic aneurysm without rupture [facility])    followed by dr dr h. Katrinka Blazing,  per echo 08/ 2021 42mm and chest CT 03/ 2021 4.0.cm   Arthritis    lower back   BPPV (benign paroxysmal positional vertigo)    Chronic back pain    CKD (chronic kidney disease), stage II    Depression    GERD (gastroesophageal reflux disease)    Headache    Hemiparesis affecting left side as late effect of  cerebrovascular accident (HCC)    Hiatal hernia    History of anemia    History of cerebrovascular accident (CVA) with residual deficit 03/2006   (neurologist--- dr Frances Furbish) admission in epic, acute right thalamic hypertensive hemorrhage w/ left sided hemiparesis   History of gastric ulcer 2003   w/ upper gi bleed,  egd with no intervention needed   Hyperlipidemia    Hypertension    followed by cardiology--- dr h. Katrinka Blazing and HTN clinic   (nuclear stress test 01-31-20121 normal w/ ef 54%; cardaic cath 08-07-2010 no sig coronary obstruction, preserved LVF, mild AR   Multinodular goiter    followed by pcp,  last ultrasound in epic 03-20-2020 , never met critiria for bx   Nonrheumatic aortic (valve) insufficiency    followed by cardiologist--- dr h. 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,  followed by dr Frances Furbish   Urge incontinence    Wears glasses    Wears hearing aid in both ears    Wears partial dentures  upper and lower   Past Surgical History:  Procedure Laterality Date   ABDOMINAL HYSTERECTOMY     CARDIAC CATHETERIZATION  08/07/2010   @ [place]  dr Lia Foyer---  no sig coronary obstruction, preserved LVF, mild AR   MASS EXCISION N/A 06/04/2021   Procedure: EXCISION SUCUTANEOUS MASS X2, UPPER BACK;  Surgeon: Clovis Riley, MD;  Location: [place];  Service: General;  Laterality: N/A;   TOTAL KNEE ARTHROPLASTY Left 01/05/2016   Procedure: TOTAL KNEE ARTHROPLASTY;  Surgeon: Frederik Pear, MD;  Location: [place];  Service: Orthopedics;  Laterality: Left;   TUBAL LIGATION Bilateral 2000     A IV Location/Drains/Wounds Patient  Lines/Drains/Airways Status     Active Line/Drains/Airways     Name Placement date Placement time Site Days   Peripheral IV 11/04/21 20 G Right Antecubital 11/04/21  1757  Antecubital  1   External Urinary Catheter 11/05/21  1232  --  less than 1   Incision (Closed) 01/05/16 Knee Left 01/05/16  1119  -- 2131   Incision (Closed) 06/04/21 Back Right 06/04/21  0833  -- 154   Incision (Closed) 06/04/21 Back Left 06/04/21  0939  -- 154            Intake/Output Last 24 hours  Intake/Output Summary (Last 24 hours) at 11/05/2021 1329 Last data filed at 11/05/2021 1323 Gross per 24 hour  Intake 2300 ml  Output --  Net 2300 ml    Labs/Imaging Results for orders placed or performed during the hospital encounter of 11/04/21 (from the past 48 hour(s))  Lipase, blood     Status: None   Collection Time: 11/04/21  1:18 PM  Result Value Ref Range   Lipase 27 11 - 51 U/L    Comment: Performed at [facility], [place] [address] [place], [place] [address]  Comprehensive metabolic panel     Status: Abnormal   Collection Time: 11/04/21  1:18 PM  Result Value Ref Range   Sodium 136 135 - 145 mmol/L   Potassium 2.7 (LL) 3.5 - 5.1 mmol/L    Comment: CRITICAL RESULT CALLED TO, READ BACK BY AND VERIFIED WITH: NEWNAUM,K RN @ 1427 11/04/21 LEONARD,A    Chloride 91 (L) 98 - 111 mmol/L   CO2 27 22 - 32 mmol/L   Glucose, Bld 135 (H) 70 - 99 mg/dL    Comment: Glucose reference range applies only to samples taken after fasting for at least 8 hours.   BUN 38 (H) 8 - 23 mg/dL   Creatinine, Ser 1.96 (H) 0.44 - 1.00 mg/dL   Calcium 9.3 8.9 - 10.3 mg/dL   Total Protein 7.4 6.5 - 8.1 g/dL   Albumin 2.9 (L) 3.5 - 5.0 g/dL   AST 14 (L) 15 - 41 U/L   ALT 11 0 - 44 U/L   Alkaline Phosphatase 93 38 - 126 U/L   Total Bilirubin 0.5 0.3 - 1.2 mg/dL   GFR, Estimated 27 (L) >60 mL/min    Comment: (NOTE) Calculated using the CKD-EPI Creatinine Equation (2021)    Anion gap 18 (H) 5 - 15    Comment:  Performed at [facility], [place] [address], [place] [address]  CBC     Status: Abnormal   Collection Time: 11/04/21  1:18 PM  Result Value Ref Range   WBC 15.3 (H) 4.0 - 10.5 K/uL   RBC 5.16 (H) 3.87 - 5.11 MIL/uL   Hemoglobin 15.1 (H) 12.0 - 15.0 g/dL  HCT 46.4 (H) 36.0 - 46.0 %   MCV 89.9 80.0 - 100.0 fL   MCH 29.3 26.0 - 34.0 pg   MCHC 32.5 30.0 - 36.0 g/dL   RDW 13.7 11.5 - 15.5 %   Platelets 309 150 - 400 K/uL   nRBC 0.0 0.0 - 0.2 %    Comment: Performed at [facility] [address], [place], [place] [address]  Magnesium     Status: None   Collection Time: 11/04/21  6:00 PM  Result Value Ref Range   Magnesium 2.3 1.7 - 2.4 mg/dL    Comment: Performed at [facility], [place] [address], [place], [place] [address]  Resp Panel by RT-PCR (Flu A&B, Covid) Nasopharyngeal Swab     Status: None   Collection Time: 11/04/21  7:26 PM   Specimen: Nasopharyngeal Swab; Nasopharyngeal(NP) swabs in vial transport medium  Result Value Ref Range   SARS Coronavirus 2 by RT PCR NEGATIVE NEGATIVE    Comment: (NOTE) SARS-CoV-2 target nucleic acids are NOT DETECTED.  The SARS-CoV-2 RNA is generally detectable in upper respiratory specimens during the acute phase of infection. The lowest concentration of SARS-CoV-2 viral copies this assay can detect is 138 copies/mL. A negative result does not preclude SARS-Cov-2 infection and should not be used as the sole basis for treatment or other patient management decisions. A negative result may occur with  improper specimen collection/handling, submission of specimen other than nasopharyngeal swab, presence of viral mutation(s) within the areas targeted by this assay, and inadequate number of viral copies(<138 copies/mL). A negative result must be combined with clinical observations, patient history, and epidemiological information. The expected result is Negative.  Fact Sheet for Patients:   EntrepreneurPulse.com.au  Fact Sheet for Healthcare Providers:  IncredibleEmployment.be  This test is no t yet approved or cleared by the [place] FDA and  has been authorized for detection and/or diagnosis of SARS-CoV-2 by FDA under an Emergency Use Authorization (EUA). This EUA will remain  in effect (meaning this test can be used) for the duration of the COVID-19 declaration under Section 564(b)(1) of the Act, 21 U.S.C.section 360bbb-3(b)(1), unless the authorization is terminated  or revoked sooner.       Influenza A by PCR NEGATIVE NEGATIVE   Influenza B by PCR NEGATIVE NEGATIVE    Comment: (NOTE) The Xpert Xpress SARS-CoV-2/FLU/RSV plus assay is intended as an aid in the diagnosis of influenza from Nasopharyngeal swab specimens and should not be used as a sole basis for treatment. Nasal washings and aspirates are unacceptable for Xpert Xpress SARS-CoV-2/FLU/RSV testing.  Fact Sheet for Patients: EntrepreneurPulse.com.au  Fact Sheet for Healthcare Providers: IncredibleEmployment.be  This test is not yet approved or cleared by the [place] FDA and has been authorized for detection and/or diagnosis of SARS-CoV-2 by FDA under an Emergency Use Authorization (EUA). This EUA will remain in effect (meaning this test can be used) for the duration of the COVID-19 declaration under Section 564(b)(1) of the Act, 21 U.S.C. section 360bbb-3(b)(1), unless the authorization is terminated or revoked.  Performed at [facility], [place] [address] [place], [place] [address]   Lactic acid, plasma     Status: None   Collection Time: 11/04/21  7:29 PM  Result Value Ref Range   Lactic Acid, Venous 1.3 0.5 - 1.9 mmol/L    Comment: Performed at [facility] [address], [place], [place] [address]  Blood culture (routine x 2)  Status: None (Preliminary result)   Collection Time: 11/04/21   8:45 PM   Specimen: BLOOD  Result Value Ref Range   Specimen Description BLOOD LEFT ANTECUBITAL    Special Requests      BOTTLES DRAWN AEROBIC AND ANAEROBIC Blood Culture results may not be optimal due to an inadequate volume of blood received in culture bottles   Culture      NO GROWTH < 24 HOURS Performed at [facility] [address] [place], [place] [address]    Report Status PENDING   CBC WITH DIFFERENTIAL     Status: Abnormal   Collection Time: 11/05/21  3:00 AM  Result Value Ref Range   WBC 12.9 (H) 4.0 - 10.5 K/uL   RBC 4.58 3.87 - 5.11 MIL/uL   Hemoglobin 13.6 12.0 - 15.0 g/dL   HCT 41.2 36.0 - 46.0 %   MCV 90.0 80.0 - 100.0 fL   MCH 29.7 26.0 - 34.0 pg   MCHC 33.0 30.0 - 36.0 g/dL   RDW 13.9 11.5 - 15.5 %   Platelets 279 150 - 400 K/uL   nRBC 0.0 0.0 - 0.2 %   Neutrophils Relative % 86 %   Neutro Abs 11.1 (H) 1.7 - 7.7 K/uL   Lymphocytes Relative 6 %   Lymphs Abs 0.7 0.7 - 4.0 K/uL   Monocytes Relative 7 %   Monocytes Absolute 0.9 0.1 - 1.0 K/uL   Eosinophils Relative 1 %   Eosinophils Absolute 0.1 0.0 - 0.5 K/uL   Basophils Relative 0 %   Basophils Absolute 0.0 0.0 - 0.1 K/uL   Immature Granulocytes 0 %   Abs Immature Granulocytes 0.05 0.00 - 0.07 K/uL    Comment: Performed at [facility] [address], [place], [place] [address]  Basic metabolic panel     Status: Abnormal   Collection Time: 11/05/21  3:00 AM  Result Value Ref Range   Sodium 135 135 - 145 mmol/L   Potassium 3.0 (L) 3.5 - 5.1 mmol/L   Chloride 98 98 - 111 mmol/L   CO2 26 22 - 32 mmol/L   Glucose, Bld 100 (H) 70 - 99 mg/dL    Comment: Glucose reference range applies only to samples taken after fasting for at least 8 hours.   BUN 38 (H) 8 - 23 mg/dL   Creatinine, Ser 1.74 (H) 0.44 - 1.00 mg/dL   Calcium 8.6 (L) 8.9 - 10.3 mg/dL   GFR, Estimated 31 (L) >60 mL/min    Comment: (NOTE) Calculated using the CKD-EPI Creatinine Equation (2021)    Anion gap 11 5 - 15    Comment:  Performed at [facility] [address] [place], [place] [address]  Potassium     Status: Abnormal   Collection Time: 11/05/21  3:00 AM  Result Value Ref Range   Potassium 2.9 (L) 3.5 - 5.1 mmol/L    Comment: Performed at [facility] [address], [place], [place] [address]   CT ABDOMEN PELVIS WO CONTRAST  Result Date: 11/04/2021 CLINICAL DATA:  Abdominal pain, acute, nonlocalized. Constipation, nausea, vomiting. EXAM: CT ABDOMEN AND PELVIS WITHOUT CONTRAST TECHNIQUE: Multidetector CT imaging of the abdomen and pelvis was performed following the standard protocol without IV contrast. COMPARISON:  None. FINDINGS: Lower chest: Mild bibasilar atelectasis.  Mild cardiomegaly. Hepatobiliary: Cholelithiasis without pericholecystic inflammatory change identified. Liver unremarkable. No intra or extrahepatic biliary ductal dilation. Pancreas: Unremarkable Spleen: Unremarkable Adrenals/Urinary Tract: Adrenal glands are unremarkable.  Kidneys are normal, without renal calculi, focal lesion, or hydronephrosis. Bladder is unremarkable. Stomach/Bowel: There is extensive extraluminal gas noted interstitially within the wall of the distal sigmoid colon extending into the distal colonic mesentery as well as within the high rectum. Gas subsequently extends into the retroperitoneum surrounding the inferior vena cava and extending into the diaphragmatic hiatus and periportal region. The exact site of perforation is not clearly identified though extensive inflammatory change involving the distal sigmoid colon favors this to represent the primary site, best seen on image # 61/3. There is no free intraperitoneal gas identified. Multiple loops of dilated fluid-filled small bowel are identified demonstrating a gradual transition within the ileum most in keeping with a developing ileus. The appendix is normal. No free intraperitoneal fluid or loculated intra-abdominal fluid collections are identified.  Vascular/Lymphatic: Aortic atherosclerosis. No enlarged abdominal or pelvic lymph nodes. Reproductive: Status post hysterectomy. No adnexal masses. Other: Small bilateral fat containing inguinal hernias are present. Musculoskeletal: No acute bone abnormality. IMPRESSION: Extensive extraluminal gas noted interstitially within the distal sigmoid colon and rectum with extension of gas into the retroperitoneum and into the colonic mesentery. The site of enteric perforation is not clearly identified, however, extensive inflammatory change suggests this is within the distal sigmoid colon. Developing ileus noted. No free intraperitoneal gas. No loculated intra-abdominal fluid collections. Cholelithiasis. Mild cardiomegaly. Electronically Signed   By: Fidela Salisbury M.D.   On: 11/04/2021 19:15    Pending Labs Unresulted Labs (From admission, onward)     Start     Ordered   11/05/21 0786  Basic metabolic panel Once  Once,   R        11/05/21 1319   11/05/21 0712  Magnesium  Add-on,   AD        11/05/21 0711   11/04/21 2047  HIV Antibody (routine testing w rflx)  (HIV Antibody (Routine testing w reflex) panel)  Once,   R        11/04/21 2052   11/04/21 1929  Blood culture (routine x 2)  BLOOD CULTURE X 2,   STAT      11/04/21 1928   11/04/21 1314  Urinalysis, Routine w reflex microscopic  Once,   STAT        11/04/21 1313            Vitals/Pain Today's Vitals   11/05/21 1000 11/05/21 1202 11/05/21 1325 11/05/21 1327  BP: (!) 177/85  (!) 184/86   Pulse: (!) 50  (!) 112   Resp: 19  18   Temp:   98.1 F (36.7 C)   TempSrc:   Oral   SpO2: 98%  98%   PainSc:  10-Worst pain ever 0-No pain 0-No pain    Isolation Precautions No active isolations  Medications Medications  piperacillin-tazobactam (ZOSYN) IVPB 3.375 g (3.375 g Intravenous New Bag/Given 11/05/21 1158)  aspirin EC tablet 81 mg (81 mg Oral Given 11/05/21 1203)  nicotine (NICODERM CQ - dosed in mg/24 hours) patch 21 mg (21 mg  Transdermal Patch Removed 11/05/21 1201)  enoxaparin (LOVENOX) injection 40 mg (40 mg Subcutaneous Given 11/04/21 2349)  acetaminophen (TYLENOL) tablet 650 mg (650 mg Oral Given 11/05/21 0416)    Or  acetaminophen (TYLENOL) suppository 650 mg ( Rectal See Alternative 11/05/21 0416)  ondansetron (ZOFRAN) tablet 4 mg (has no administration in time range)    Or  ondansetron (ZOFRAN) injection 4 mg (has no administration in time range)  lactated ringers infusion (0 mLs Intravenous Stopped 11/05/21  1148)  HYDROmorphone (DILAUDID) injection 0.5 mg (0.5 mg Intravenous Given 11/05/21 0436)  pantoprazole (PROTONIX) injection 40 mg (40 mg Intravenous Given 11/04/21 2349)  potassium chloride 10 mEq in 100 mL IVPB (10 mEq Intravenous New Bag/Given 11/05/21 1324)  traMADol (ULTRAM) tablet 50 mg (50 mg Oral Given 11/05/21 1204)  0.9 % NaCl with KCl 20 mEq/ L  infusion ( Intravenous New Bag/Given 11/05/21 1149)  sodium chloride 0.9 % bolus 1,000 mL (0 mLs Intravenous Stopped 11/05/21 1148)  potassium chloride 10 mEq in 100 mL IVPB (0 mEq Intravenous Stopped 11/04/21 2138)  ondansetron (ZOFRAN) injection 4 mg (4 mg Intravenous Given 11/04/21 1758)  potassium chloride SA (KLOR-CON M) CR tablet 40 mEq (40 mEq Oral Given 11/04/21 1905)  magnesium sulfate IVPB 2 g 50 mL (0 g Intravenous Stopped 11/04/21 2137)  fentaNYL (SUBLIMAZE) injection 50 mcg (50 mcg Intravenous Given 11/04/21 2136)  piperacillin-tazobactam (ZOSYN) IVPB 3.375 g (0 g Intravenous Stopped 11/04/21 2253)    Mobility walks with device cane/rollator (cane at bedside) Low fall risk   Focused Assessments Abd pain. Pt has a bowel perforation. Pt c/o intermittent sharp pains.   R Recommendations: See Admitting Provider Note  Report given to:   Additional Notes:

## 2021-11-05 NOTE — Hospital Course (Addendum)
Doing fine this morning, ready to go home. Denies any chest pain, palpitations, dizziness, abdominal pain. Has been able to eat soft foods. Has not mobilized this morning yet, but will do so today.

## 2021-11-06 ENCOUNTER — Inpatient Hospital Stay (HOSPITAL_COMMUNITY): Payer: Medicare Other

## 2021-11-06 DIAGNOSIS — I10 Essential (primary) hypertension: Secondary | ICD-10-CM

## 2021-11-06 LAB — BASIC METABOLIC PANEL
Anion gap: 12 (ref 5–15)
Anion gap: 15 (ref 5–15)
BUN: 21 mg/dL (ref 8–23)
BUN: 12 mg/dL (ref 8–23)
CO2: 28 mmol/L (ref 22–32)
CO2: 26 mmol/L (ref 22–32)
Calcium: 8.5 mg/dL — ABNORMAL LOW (ref 8.9–10.3)
Calcium: 8.9 mg/dL (ref 8.9–10.3)
Chloride: 98 mmol/L (ref 98–111)
Chloride: 93 mmol/L — ABNORMAL LOW (ref 98–111)
Creatinine, Ser: 1.16 mg/dL — ABNORMAL HIGH (ref 0.44–1.00)
Creatinine, Ser: 0.99 mg/dL (ref 0.44–1.00)
GFR, Estimated: 51 mL/min — ABNORMAL LOW (ref >60)
GFR, Estimated: >60 mL/min (ref >60)
Glucose, Bld: 75 mg/dL (ref 70–99)
Glucose, Bld: 89 mg/dL (ref 70–99)
Potassium: 3.2 mmol/L — ABNORMAL LOW (ref 3.5–5.1)
Potassium: 3.1 mmol/L — ABNORMAL LOW (ref 3.5–5.1)
Sodium: 138 mmol/L (ref 135–145)
Sodium: 134 mmol/L — ABNORMAL LOW (ref 135–145)

## 2021-11-06 LAB — CBC
HCT: 39.7 % (ref 36.0–46.0)
Hemoglobin: 12.6 g/dL (ref 12.0–15.0)
MCH: 28.6 pg (ref 26.0–34.0)
MCHC: 31.7 g/dL (ref 30.0–36.0)
MCV: 90 fL (ref 80.0–100.0)
Platelets: 286 10*3/uL (ref 150–400)
RBC: 4.41 MIL/uL (ref 3.87–5.11)
RDW: 13.9 % (ref 11.5–15.5)
WBC: 12.6 10*3/uL — ABNORMAL HIGH (ref 4.0–10.5)
nRBC: 0 % (ref 0.0–0.2)

## 2021-11-06 MED ORDER — HYDRALAZINE HCL 20 MG/ML IJ SOLN
10.0000 mg | INTRAMUSCULAR | Status: DC | PRN
  Administered 2021-11-06 – 2021-11-11 (×9): 10 mg via INTRAVENOUS
  Filled 2021-11-06 (×9): qty 1

## 2021-11-06 MED ORDER — IOHEXOL 9 MG/ML PO SOLN
500.0000 mL | ORAL | Status: AC
  Administered 2021-11-06 (×2): 500 mL via ORAL

## 2021-11-06 MED ORDER — POTASSIUM CHLORIDE 10 MEQ/100ML IV SOLN
10.0000 meq | INTRAVENOUS | Status: AC
  Administered 2021-11-06 (×4): 10 meq via INTRAVENOUS
  Filled 2021-11-06 (×4): qty 100

## 2021-11-06 MED ORDER — HYDRALAZINE HCL 20 MG/ML IJ SOLN
5.0000 mg | INTRAMUSCULAR | Status: DC | PRN
  Administered 2021-11-06: 5 mg via INTRAVENOUS
  Filled 2021-11-06: qty 1

## 2021-11-06 MED ORDER — IOHEXOL 300 MG/ML  SOLN
100.0000 mL | Freq: Once | INTRAMUSCULAR | Status: AC | PRN
  Administered 2021-11-06: 100 mL via INTRAVENOUS

## 2021-11-06 MED ORDER — LABETALOL HCL 5 MG/ML IV SOLN
10.0000 mg | Freq: Four times a day (QID) | INTRAVENOUS | Status: DC
Start: 2021-11-06 — End: 2021-11-07
  Administered 2021-11-06 – 2021-11-07 (×3): 10 mg via INTRAVENOUS
  Filled 2021-11-06 (×4): qty 4

## 2021-11-06 MED ORDER — POTASSIUM CHLORIDE 10 MEQ/100ML IV SOLN
10.0000 meq | INTRAVENOUS | Status: AC
  Administered 2021-11-06 – 2021-11-07 (×4): 10 meq via INTRAVENOUS
  Filled 2021-11-06 (×4): qty 100

## 2021-11-06 MED ORDER — KCL IN DEXTROSE-NACL 20-5-0.45 MEQ/L-%-% IV SOLN
INTRAVENOUS | Status: DC
  Filled 2021-11-06 (×4): qty 1000

## 2021-11-06 NOTE — Progress Notes (Signed)
Subjective: Patient was seen at bedside during rounds this morning. Pt complains that she had nausea and 2 vomiting episodes this morning after drinking contrast prior to imaging.  She reports she had two episodes of R-sided abdominal pain with radiation to the LLQ this morning. She feels like she is getting worse overall. She also complains of diaphoresis since last evening. She notes that she had one BM with non-bloody loose stools. Pt denies blurry vision, palpitations, muscle cramping, chills, fevers and new onset headaches. No other complaints at this time.   Per patient, surgery is going to do some imaging today and then will more likely need to perform surgery.  Objective:  Vital signs in last 24 hours: Vitals:   11/05/21 2334 11/06/21 0000 11/06/21 0200 11/06/21 0429  BP: (!) 202/78 (!) 158/74 (!) 152/72 (!) 108/46  Pulse: 98 87 76 90  Resp: 20 20 15 18   Temp: 98 F (36.7 C)   98 F (36.7 C)  TempSrc: Oral   Oral  SpO2: 98% 93% 94% 100%  Weight:      Height:       General Physical Exam Constitutional: alert, moderately ill appearing, in no acute distress HENT: normocephalic, atraumatic, mucous membranes moist Eyes: conjunctiva non-erythematous, extraocular movements intact Neck: supple Cardiovascular: tachycardic and irregular rhythm, no m/r/g Pulmonary/Chest: normal work of breathing on room air, lungs clear to auscultation bilaterally Abdominal: distended, RLQ abdominal tenderness to palpation and with guarding, hypoactive BS.  MSK: normal bulk and tone Neurological: alert & oriented x 3  Skin: warm and dry Psych: normal behavior, normal affect   Assessment/Plan:  Principal Problem:   Perforated diverticulum Active Problems:   Hypokalemia   Hypertension   AKI (acute kidney injury) (HCC)   Chronic back pain   Chronic, continuous use of opioids   Bowel perforation (HCC)  Chelsea [name]. Spohr is a 70 yo F with a history of HTN, HLD, GERD,  thoracic aortic  aneurysm, mild aortic regurgitation, chronic pain on opioids, CKD, prior CVA and obesity presenting with abdominal pain and admitted for perforated diverticulitis.  Sepsis likely secondary to perforated diverticulitis-worsening Pt complains of worsening symptoms of nausea, vomiting, and sharp abdominal pain while receiving 4 mg zofran Q6H PRN and 0.5 mg of dilaudid Q4H PRN. Pt has intermittent tachypnea with RR in 20s and leukocytosis with WBC count of 12.6 in the setting of perforated diverticulitis. Blood cultures show no growth in 2 days. Pt continues to meet sepsis criteria without bacteremia at this time. Per patient, surgery is going to do some imaging today and then will more likely need to perform surgery. Surgery plans to do an exploratory laparotomy, partial colectomy and colostomy if pt continues to worsen. Pt is still on IV Zosyn and being monitored for acute signs of peritonitis, new onset fever, chills, severe abdominal pain, or increased vomiting.  -Surgery is following pt, appreciate recs. -Await CT with contrast results. -IV Dilaudid 0.5mg  every 4 hours as needed for moderate pain -Tylenol 650 mg every 6 hours as needed for mild pain/fever -4 mg zofran Q6H PRN for nausea -Monitor CBC and BMP -Keep NPO for possible surgery  Atrial Tachycardia-improving EKG reveals atrial tachycardia with LVH likely secondary to sepsis possibly due to perforated diverticulitis. Pt does not have a history of atrial fibrillation or history of arrhythmias. She denies any chest pain or palpations at this time. Pt was started on labetalol for BP control yesterday and her HR is improved likely due to this  today. HR in 80s. -Continue telemetry  Resistant Hypertension Aortic aneurysm 4 cm-stable Patient has a history of resistant hypertension and currently on 5 agent medication regimen. Chest CT obtained March 2021 confirmed thoracic aortic aneurysm measuring 4 cm in diameter. This morning pt's SBP continued  to be in the 200s and she was given two 5 mg doses of labetalol without much relief. Goal is to maintain SBP <180. Pt is asymptomatic at this time.  -Continue 0.2 mg clonidine patch.  -Start labetalol 10 mg Q6H and hydralazine 5 mg Q3H PRN to maintain SBP<180 -Hold all other home HTN regimen.   Hypokalemia-improving Potassium levels rising with latest value of 3.2 after IV potassium repletion. Repeat magnesium of 2.1 is within normal limits. Pt remains asymptomatic at this time.   -Will recheck BMP and replete PRN     Best Practice: Diet: NPO IVF: NS,75cc/hr VTE: Enoxaparin Code: Full  Signature: Claudean Kinds, [facility]  Pager: [phone] 7:58 AM, 11/06/2021

## 2021-11-06 NOTE — Progress Notes (Signed)
Pharmacy Antibiotic Note  Chelsea [name] is a 70 y.o. female admitted on 11/04/2021 with  colonic performaton .  Pharmacy has been consulted for Zosyn dosing.  ID: Colonic perforation; WBC 15 > 12, afebrile, Scr 1.16 down  Zosyn 12/7 >  12/7 BC > NGTD  Plan: -Zosyn 3.375 gm IV Q 8 hours  Pharmacy will sign off. Please reconsult for further dosing assitance.     Height: 5\' 6"  (167.6 cm) Weight: 78.8 kg (173 lb 11.6 oz) IBW/kg (Calculated) : 59.3  Temp (24hrs), Avg:98.2 F (36.8 C), Min:98 F (36.7 C), Max:98.9 F (37.2 C)  Recent Labs  Lab 11/04/21 1318 11/04/21 1929 11/05/21 0300 11/05/21 1901 11/06/21 0206  WBC 15.3*  --  12.9*  --  12.6*  CREATININE 1.96*  --  1.74* 1.23* 1.16*  LATICACIDVEN  --  1.3  --   --   --     Estimated Creatinine Clearance: 48.5 mL/min (A) (by C-G formula based on SCr of 1.16 mg/dL (H)).    Allergies  Allergen Reactions   Other Swelling    Hair dye, caused eye swelling, multiple times experienced this reaction   Hydrocodone Nausea And Vomiting   Oxycodone Nausea And Vomiting    Johnryan Sao S. Alford Highland, PharmD, BCPS Clinical Staff Pharmacist [email or web address]  Wayland Salinas 11/06/2021 9:24 AM

## 2021-11-06 NOTE — Progress Notes (Signed)
Progress Note  2 Days Post-Op  Subjective: Doesn't really feel much different today.  Not sure that pain is really much better than yesterday.  No nausea.  Objective: Vital signs in last 24 hours: Temp:  [98 F (36.7 C)-98.9 F (37.2 C)] 98 F (36.7 C) (12/09 0429) Pulse Rate:  [50-115] 90 (12/09 0429) Resp:  [15-25] 18 (12/09 0429) BP: (108-216)/(46-125) 108/46 (12/09 0429) SpO2:  [92 %-100 %] 100 % (12/09 0429) Weight:  [78.8 kg] 78.8 kg (12/08 1711)    Intake/Output from previous day: 12/08 0701 - 12/09 0700 In: 2565.1 [I.V.:1215.1; IV Piggyback:1350] Out: 1200 [Urine:1200] Intake/Output this shift: No intake/output data recorded.  PE: General: pleasant, WD, overweight female who is laying in bed in NAD Heart: irregularly irregular, rate in the 90s Lungs: CTAB Abd: soft, generalized ttp with worst pain in RLQ, no rebound ttp, no involuntary or voluntary guarding, ND, +BS Psych: A&Ox3 with an appropriate affect.    Lab Results:  Recent Labs    11/05/21 0300 11/06/21 0206  WBC 12.9* 12.6*  HGB 13.6 12.6  HCT 41.2 39.7  PLT 279 286   BMET Recent Labs    11/05/21 0300 11/05/21 1901  NA 135 137  K 3.0*  2.9* 3.0*  CL 98 98  CO2 26 28  GLUCOSE 100* 84  BUN 38* 24*  CREATININE 1.74* 1.23*  CALCIUM 8.6* 8.7*   PT/INR No results for input(s): LABPROT, INR in the last 72 hours. CMP     Component Value Date/Time   NA 137 11/05/2021 1901   NA 144 10/22/2020 1352   K 3.0 (L) 11/05/2021 1901   CL 98 11/05/2021 1901   CO2 28 11/05/2021 1901   GLUCOSE 84 11/05/2021 1901   BUN 24 (H) 11/05/2021 1901   BUN 16 10/22/2020 1352   CREATININE 1.23 (H) 11/05/2021 1901   CREATININE 1.11 (H) 04/20/2016 1409   CALCIUM 8.7 (L) 11/05/2021 1901   PROT 7.4 11/04/2021 1318   PROT 6.8 08/11/2020 1058   ALBUMIN 2.9 (L) 11/04/2021 1318   ALBUMIN 3.8 08/11/2020 1058   AST 14 (L) 11/04/2021 1318   ALT 11 11/04/2021 1318   ALKPHOS 93 11/04/2021 1318   BILITOT 0.5  11/04/2021 1318   BILITOT 0.4 08/11/2020 1058   GFRNONAA 48 (L) 11/05/2021 1901   GFRAA 42 (L) 10/22/2020 1352   Lipase     Component Value Date/Time   LIPASE 27 11/04/2021 1318       Studies/Results: CT ABDOMEN PELVIS WO CONTRAST  Result Date: 11/04/2021 CLINICAL DATA:  Abdominal pain, acute, nonlocalized. Constipation, nausea, vomiting. EXAM: CT ABDOMEN AND PELVIS WITHOUT CONTRAST TECHNIQUE: Multidetector CT imaging of the abdomen and pelvis was performed following the standard protocol without IV contrast. COMPARISON:  None. FINDINGS: Lower chest: Mild bibasilar atelectasis.  Mild cardiomegaly. Hepatobiliary: Cholelithiasis without pericholecystic inflammatory change identified. Liver unremarkable. No intra or extrahepatic biliary ductal dilation. Pancreas: Unremarkable Spleen: Unremarkable Adrenals/Urinary Tract: Adrenal glands are unremarkable. Kidneys are normal, without renal calculi, focal lesion, or hydronephrosis. Bladder is unremarkable. Stomach/Bowel: There is extensive extraluminal gas noted interstitially within the wall of the distal sigmoid colon extending into the distal colonic mesentery as well as within the high rectum. Gas subsequently extends into the retroperitoneum surrounding the inferior vena cava and extending into the diaphragmatic hiatus and periportal region. The exact site of perforation is not clearly identified though extensive inflammatory change involving the distal sigmoid colon favors this to represent the primary site, best seen on image #  61/3. There is no free intraperitoneal gas identified. Multiple loops of dilated fluid-filled small bowel are identified demonstrating a gradual transition within the ileum most in keeping with a developing ileus. The appendix is normal. No free intraperitoneal fluid or loculated intra-abdominal fluid collections are identified. Vascular/Lymphatic: Aortic atherosclerosis. No enlarged abdominal or pelvic lymph nodes.  Reproductive: Status post hysterectomy. No adnexal masses. Other: Small bilateral fat containing inguinal hernias are present. Musculoskeletal: No acute bone abnormality. IMPRESSION: Extensive extraluminal gas noted interstitially within the distal sigmoid colon and rectum with extension of gas into the retroperitoneum and into the colonic mesentery. The site of enteric perforation is not clearly identified, however, extensive inflammatory change suggests this is within the distal sigmoid colon. Developing ileus noted. No free intraperitoneal gas. No loculated intra-abdominal fluid collections. Cholelithiasis. Mild cardiomegaly. Electronically Signed   By: Fidela Salisbury M.D.   On: 11/04/2021 19:15    Anti-infectives: Anti-infectives (From admission, onward)    Start     Dose/Rate Route Frequency Ordered Stop   11/05/21 0400  piperacillin-tazobactam (ZOSYN) IVPB 3.375 g        3.375 g 12.5 mL/hr over 240 Minutes Intravenous Every 8 hours 11/04/21 2018     11/04/21 2015  piperacillin-tazobactam (ZOSYN) IVPB 3.375 g        3.375 g 100 mL/hr over 30 Minutes Intravenous  Once 11/04/21 2009 11/04/21 2253        Assessment/Plan Perforated diverticulitis with retroperitoneal gas - relatively contained perforation without free fluid of abscess - WBC 12  - pt prefers trial of non-operative management with IV abx and bowel rest, but understands that if she fails to improve or worsens that she may require surgical intervention. She is aware that surgery would likely involve exploratory laparotomy, partial colectomy and colostomy.  - family has requested repeat CT scan.  We have discussed this may not look much different as it hasn't been very long since her last one, but we will order it today and see what it looks like. 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

## 2021-11-06 NOTE — Progress Notes (Signed)
Pt BP remains elevated, 223/90 (RUE) and 224/88 (LUE), after admin of hydralazine. Pt states she is asymptomatic. Paged IMTS. Instructed to give IV dilaudid.  Raelyn Number, RN

## 2021-11-06 NOTE — Progress Notes (Addendum)
Gave pt 5mg  IV labetalol at 0709 for SBP 192/85. Following BPs 173/100, 191/97. Gave pt second 5mg  IV labetalol dose at 0924. Following BP 208/94, 200/95  paged IMTS.   Spoke with MD. Orders to schedule 10mg  IV labetalol q6hr and 5mg  IV hydralazine PRN q3hr placed.   1100 BP 212/104. Admin 5mg  IV hydralazine PRN per order at 1135. Paged FMTS. Waiting for new orders.

## 2021-11-06 NOTE — Plan of Care (Signed)
  Problem: Education: Goal: Knowledge of General Education information will improve Description: Including pain rating scale, medication(s)/side effects and non-pharmacologic comfort measures Outcome: Progressing   Problem: Clinical Measurements: Goal: Ability to maintain clinical measurements within normal limits will improve Outcome: Progressing Goal: Will remain free from infection Outcome: Progressing Goal: Diagnostic test results will improve Outcome: Progressing Goal: Respiratory complications will improve Outcome: Progressing Goal: Cardiovascular complication will be avoided Outcome: Progressing   Problem: Activity: Goal: Risk for activity intolerance will decrease Outcome: Progressing   Problem: Elimination: Goal: Will not experience complications related to bowel motility Outcome: Progressing Goal: Will not experience complications related to urinary retention Outcome: Progressing

## 2021-11-07 DIAGNOSIS — K5720 Diverticulitis of large intestine with perforation and abscess without bleeding: Secondary | ICD-10-CM

## 2021-11-07 LAB — CBC
HCT: 39.1 % (ref 36.0–46.0)
Hemoglobin: 12.8 g/dL (ref 12.0–15.0)
MCH: 29.2 pg (ref 26.0–34.0)
MCHC: 32.7 g/dL (ref 30.0–36.0)
MCV: 89.1 fL (ref 80.0–100.0)
Platelets: 325 10*3/uL (ref 150–400)
RBC: 4.39 MIL/uL (ref 3.87–5.11)
RDW: 14.1 % (ref 11.5–15.5)
WBC: 14.3 10*3/uL — ABNORMAL HIGH (ref 4.0–10.5)
nRBC: 0 % (ref 0.0–0.2)

## 2021-11-07 LAB — BASIC METABOLIC PANEL
Anion gap: 13 (ref 5–15)
Anion gap: 12 (ref 5–15)
BUN: 18 mg/dL (ref 8–23)
BUN: 14 mg/dL (ref 8–23)
CO2: 24 mmol/L (ref 22–32)
CO2: 25 mmol/L (ref 22–32)
Calcium: 8.4 mg/dL — ABNORMAL LOW (ref 8.9–10.3)
Calcium: 8.8 mg/dL — ABNORMAL LOW (ref 8.9–10.3)
Chloride: 95 mmol/L — ABNORMAL LOW (ref 98–111)
Chloride: 99 mmol/L (ref 98–111)
Creatinine, Ser: 1.21 mg/dL — ABNORMAL HIGH (ref 0.44–1.00)
Creatinine, Ser: 0.96 mg/dL (ref 0.44–1.00)
GFR, Estimated: 49 mL/min — ABNORMAL LOW (ref >60)
GFR, Estimated: >60 mL/min (ref >60)
Glucose, Bld: 92 mg/dL (ref 70–99)
Glucose, Bld: 92 mg/dL (ref 70–99)
Potassium: 3.8 mmol/L (ref 3.5–5.1)
Potassium: 3.1 mmol/L — ABNORMAL LOW (ref 3.5–5.1)
Sodium: 132 mmol/L — ABNORMAL LOW (ref 135–145)
Sodium: 136 mmol/L (ref 135–145)

## 2021-11-07 MED ORDER — LABETALOL HCL 5 MG/ML IV SOLN: 10.0000 mg | Freq: Four times a day (QID) | INTRAVENOUS | Status: DC

## 2021-11-07 MED ORDER — GABAPENTIN 300 MG PO CAPS
300.0000 mg | ORAL_CAPSULE | ORAL | Status: AC
  Administered 2021-11-08: 300 mg via ORAL
  Filled 2021-11-07: qty 1

## 2021-11-07 MED ORDER — SODIUM CHLORIDE 0.9 % IV BOLUS
1000.0000 mL | Freq: Once | INTRAVENOUS | Status: AC
  Administered 2021-11-07: 1000 mL via INTRAVENOUS

## 2021-11-07 MED ORDER — LABETALOL HCL 5 MG/ML IV SOLN
5.0000 mg | Freq: Four times a day (QID) | INTRAVENOUS | Status: DC
Start: 2021-11-07 — End: 2021-11-09
  Administered 2021-11-07 – 2021-11-09 (×8): 5 mg via INTRAVENOUS
  Filled 2021-11-07 (×8): qty 4

## 2021-11-07 MED ORDER — ENSURE PRE-SURGERY PO LIQD
296.0000 mL | Freq: Once | ORAL | Status: AC
  Administered 2021-11-08: 296 mL via ORAL
  Filled 2021-11-07: qty 296

## 2021-11-07 MED ORDER — BUPIVACAINE LIPOSOME 1.3 % IJ SUSP
20.0000 mL | Freq: Once | INTRAMUSCULAR | Status: DC
  Filled 2021-11-07: qty 20

## 2021-11-07 MED ORDER — METRONIDAZOLE 500 MG PO TABS
1000.0000 mg | ORAL_TABLET | ORAL | Status: AC
  Administered 2021-11-07 (×3): 1000 mg via ORAL
  Filled 2021-11-07 (×3): qty 2

## 2021-11-07 MED ORDER — LABETALOL HCL 5 MG/ML IV SOLN
5.0000 mg | Freq: Four times a day (QID) | INTRAVENOUS | Status: DC
  Administered 2021-11-07: 5 mg via INTRAVENOUS
  Filled 2021-11-07: qty 4

## 2021-11-07 MED ORDER — POTASSIUM CHLORIDE 10 MEQ/100ML IV SOLN
10.0000 meq | INTRAVENOUS | Status: AC
  Administered 2021-11-07 (×4): 10 meq via INTRAVENOUS
  Filled 2021-11-07 (×4): qty 100

## 2021-11-07 MED ORDER — NEOMYCIN SULFATE 500 MG PO TABS
1000.0000 mg | ORAL_TABLET | ORAL | Status: AC
  Administered 2021-11-07 (×2): 1000 mg via ORAL
  Filled 2021-11-07 (×5): qty 2

## 2021-11-07 MED ORDER — ENSURE PRE-SURGERY PO LIQD
592.0000 mL | Freq: Once | ORAL | Status: AC
  Administered 2021-11-07: 592 mL via ORAL
  Filled 2021-11-07 (×2): qty 592

## 2021-11-07 MED ORDER — ALVIMOPAN 12 MG PO CAPS
12.0000 mg | ORAL_CAPSULE | ORAL | Status: AC
  Administered 2021-11-08: 12 mg via ORAL
  Filled 2021-11-07 (×2): qty 1

## 2021-11-07 MED ORDER — SODIUM CHLORIDE 0.9 % IV SOLN
2.0000 g | INTRAVENOUS | Status: AC
  Filled 2021-11-07 (×2): qty 2

## 2021-11-07 MED ORDER — ACETAMINOPHEN 500 MG PO TABS
1000.0000 mg | ORAL_TABLET | ORAL | Status: AC
  Administered 2021-11-08: 1000 mg via ORAL
  Filled 2021-11-07: qty 2

## 2021-11-07 MED ORDER — ENOXAPARIN SODIUM 40 MG/0.4ML IJ SOSY
40.0000 mg | PREFILLED_SYRINGE | Freq: Once | INTRAMUSCULAR | Status: AC
  Administered 2021-11-08: 40 mg via SUBCUTANEOUS
  Filled 2021-11-07: qty 0.4

## 2021-11-07 NOTE — Consult Note (Signed)
[facility] Nurse ostomy consult note Consult received for preoperative marking for possible ileostomy or colostomy. Order placed 11/07/21 @ 1111 for surgery scheduled on 11/08/21 @ 745. Marking unable to be completed due to [facility] team not available over the weekend. [facility] team will follow-up postoperatively if ostomy is placed. Will place on our follow up list and check on Monday AM.   Jocelyn Lamer L. Tamala Julian, MSN, RN, [place], [place]le Pearl, [facility] Wound Treatment Associate Pager [phone]

## 2021-11-07 NOTE — Progress Notes (Addendum)
Subjective:  No acute overnight events.  Patient was seen at bedside during rounds this morning. Pt reports that her frequency and intensity of pain has increased in the last day. She continues to endorse diaphoresis and multiple loose stools, but denies any new episodes of of nausea or vomiting since yesterday. Pt also denies chills, fevers, blurry vision and new onset headaches. No other complaints at this time.    Objective:  Vital signs in last 24 hours: Vitals:   11/06/21 2030 11/06/21 2300 11/07/21 0420 11/07/21 0757  BP: 114/73 117/65 135/73 (!) 159/82  Pulse:  78 75 86  Resp: 16 18 19 19   Temp:  98 F (36.7 C) 97.6 F (36.4 C) 98.6 F (37 C)  TempSrc:  Oral Oral Oral  SpO2:  98% 99% 98%  Weight:      Height:       General Physical Exam Constitutional: alert, moderately ill appearing, in no acute distress HENT: normocephalic, atraumatic, mucous membranes moist Eyes: conjunctiva non-erythematous, extraocular movements intact Neck: supple Cardiovascular: regular rate and irregular rhythm, no m/r/g Pulmonary/Chest: normal work of breathing on room air, lungs clear to auscultation bilaterally Abdominal: distended, RLQ and LLQ abdominal tenderness to palpation and with guarding, hypoactive BS.  MSK: normal bulk and tone Neurological: alert & oriented x 3  Skin: warm and dry Psych: normal behavior, normal affect   Assessment/Plan:  Principal Problem:   Perforated diverticulum Active Problems:   Hypokalemia   Hypertension   AKI (acute kidney injury) (HCC)   Chronic back pain   Chronic, continuous use of opioids   Bowel perforation (HCC)  Chelsea [name] is a 70 yo F with a history of HTN, HLD, GERD,  thoracic aortic aneurysm, mild aortic regurgitation, chronic pain on opioids, CKD, prior CVA and obesity presenting with abdominal pain and admitted for perforated diverticulitis, now awaiting surgical intervention.  Perforated diverticulitis-worsening She  complains of worsening intensity and frequency of abdominal pain. Leukocytosis is worsening, although remains afebrile. Abdominal CT with contrast reveals perforated sigmoid colon with mild progression of robust inflammatory response in bowel wall thickening in the RLQ. No presence of abscess or changes in extensive extra luminal gas around the sigmoid colon and retroperitoneum.There is small volume intraperitoneal free fluid with associated thin peritoneal enhancement, consistent with early signs of peritonitis although no peritonitic symptoms on physical exam. Also has early signs of developing ileus in RLQ on imaging. Patient and family decided to pursue surgical intervention with likely ex-lap with [facility] procedure (partial colectomy with end colostomy).  -continue IV zosyn -Surgery following, appreciate recs. -IV Dilaudid 0.5mg  every 4 hours as needed for moderate pain -Tylenol 650 mg every 6 hours as needed for mild pain/fever -4 mg zofran Q6H PRN for nausea -Monitor CBC and BMP -Keep NPO for surgery either tomorrow or Monday    Acute Kidney Injury Pt took oral contrast while being kept NPO and has had decreased PO intake. Creatinine increased from 0.99 to 1.21. This is likely 2/2 contrast induced nephropathy and dehydration. Will hydrate and trend kidney function. -received 1L NS -continue D5-1/2NS @75cc /hr -Check BMP daily  Resistant Hypertension Aortic aneurysm 4 cm-stable Patient has a history of resistant hypertension and currently on 6 agent medication regimen. Chest CT obtained March 2021 confirmed thoracic aortic aneurysm measuring 4 cm in diameter. Today SBPs from 95 to 127 while on regimen of labetalol 10 mg Q6H and hydralazine 5 mg Q3H PRN to maintain SBP<180. Goal is to maintain SBP <180. Pt  is asymptomatic at this time.  -Continue 0.2 mg clonidine patch weekly -Changing labetalol dosage to 5 mg Q6H -continue hydralazine 10 mg Q3H PRN to maintain SBP<180 -Hold all other home  HTN regimen.  Atrial Tachycardia-improving EKG reveals atrial tachycardia with LVH likely secondary to sepsis possibly due to perforated diverticulitis. Pt does not have a history of atrial fibrillation or history of arrhythmias. She denies any chest pain or palpations at this time. Pt was started on labetalol for BP control yesterday and her HR is improved likely due to this today. HR in 80s. -Continue telemetry  Hypokalemia-resolved Potassium levels rising with latest value of 3.8 after IV potassium repletion.  -Will recheck BMP and replete PRN     Best Practice: Diet: NPO IVF: NS,75cc/hr VTE: Enoxaparin Code: Full  Signature: Claudean Kinds, [facility]  Pager: [phone] 9:44 AM, 11/07/2021   Attestation for Student Documentation:  I personally was present and performed or re-performed the history, physical exam and medical decision-making activities of this service and have verified that the service and findings are accurately documented in the student's note.  Virl Axe, MD 11/07/2021, 1:29 PM

## 2021-11-07 NOTE — Progress Notes (Signed)
Chelsea [name] [id number] 1951-08-20  CARE TEAM:  PCP: Trey Sailors, PA  Outpatient Care Team: Patient Care Team: Trey Sailors, [place] as PCP - General (Physician Assistant) Belva Crome, MD as PCP - Cardiology (Cardiology)  Inpatient Treatment Team: Treatment Team: Attending Provider: Axel Filler, MD; Rounding Team: (Rounding), Imts Orene Desanctis, MD; Consulting Physician: Edison Pace, Md, MD; Adriana Mccallum: Claudean Kinds, Medical Student; Technician: Loma Messing, NT; Technician: Santina Evans, NT; Technician: Williams Che, NT; Registered Nurse: Daymon Larsen, RN; Utilization Review: Conception Oms, RN; Social Worker: Bary Castilla, LCSW   Problem List:   Principal Problem:   Perforated diverticulum Active Problems:   Hypokalemia   Hypertension   AKI (acute kidney injury) ([place])   Chronic back pain   Chronic, continuous use of opioids   Bowel perforation ([place])       Assessment  Persistent significant diverticulitis.  [facility] Stay = 3 days)  Assessment/Plan Perforated diverticulitis with retroperitoneal gas -Some progression of perforation and slightly increased white count concerning.   - pt and son initially preferred a trial of non-operative management with IV abx and bowel rest, but I think we are hitting a point of futility where it is less likely to be successful since she has had emesis, progression of leukocytosis, just a leukocytosis.  I think she would benefit from abdominal exploration with probable Hartmann resection (sigmoid colectomy and end colostomy).  Leave the wound open for packing.  Explained in detail with the patient, her son Redmond Pulling on the phone, and also daughter & fiancee in room.  They are interested in proceeding with surgery.  Her blood pressure is under better control now she is a little more stabilized, so most likely will be safe for her to approach now.  Try Sunday vs Monday depending on availability.  Not safe  for bowel prep  The anatomy & physiology of the digestive tract was discussed.  The pathophysiology of the colon was discussed.  Natural history risks without surgery was discussed.   I feel the risks of no intervention will lead to serious problems that outweigh the operative risks; therefore, I recommended a partial colectomy to remove the pathology.  Minimally invasive (Robotic/Laparoscopic) & open techniques were discussed.   Risks such as bleeding, infection, abscess, leak, reoperation, injury to other organs, need for repair of tissues / organs, possible ostomy, hernia, heart attack, stroke, death, and other risks were discussed.  I noted a good likelihood this will help address the problem.   Goals of post-operative recovery were discussed as well.   Need for adequate nutrition, daily bowel regimen and healthy physical activity, to optimize recovery was noted as well. We will work to minimize complications.  Educational materials were available as well.  Questions were answered.  The patient expresses understanding & wishes to proceed with surgery.  Her children are in agreement as well spent a long time discussing with him and tried to explain techniques and need for second operation.  Hopefully can do colostomy takedown in 3-6 months if she has better.  -WOCN consultation for preoperative marking -cont NPO.  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   I have reviewed this  patient's available data, including medical history, events of note, physical examination and test results as part of my evaluation.  A significant portion of that time was spent in counseling.  Care during the described time interval was provided by  me.  11/07/2021    Subjective: (Chief complaint)  " When I am I going to get surgery?"  Patient now feeling like she needs to get surgery.  Feeling a little more sore.  Threw up yesterday.  Called son who was on speaker phone for most of the visit.  A minute later, daughter and her boyfriend came in the room.  Objective:  Vital signs:  Vitals:   11/06/21 2030 11/06/21 2300 11/07/21 0420 11/07/21 0757  BP: 114/73 117/65 135/73 (!) 159/82  Pulse:  78 75 86  Resp: 16 18 19 19   Temp:  98 F (36.7 C) 97.6 F (36.4 C) 98.6 F (37 C)  TempSrc:  Oral Oral Oral  SpO2:  98% 99% 98%  Weight:      Height:        Last BM Date: 11/06/21  Intake/Output   Yesterday:  12/09 0701 - 12/10 0700 In: 1354.7 [P.O.:1025; I.V.:329.7] Out: -  This shift:  No intake/output data recorded.  Bowel function:  Flatus: YES  BM:  No  Drain: (No drain)   Physical Exam:  General: Pt awake/alert in mild acute distress Eyes: PERRL, normal EOM.  Sclera clear.  No icterus Neuro: CN II-XII intact w/o focal sensory/motor deficits. Lymph: No head/neck/groin lymphadenopathy Psych:  No delerium/psychosis/paranoia.  Oriented x 4 HENT: Normocephalic, Mucus membranes moist.  No thrush Neck: Supple, No tracheal deviation.  No obvious thyromegaly Chest: No pain to chest wall compression.  Good respiratory excursion.  No audible wheezing CV:  Pulses intact.  Regular rhythm.  No major extremity edema MS: Normal AROM mjr joints.  No obvious deformity  Abdomen: Soft.  Moderately distended.  Tenderness at [facility] .  Focal peritonitis.  No incarcerated hernias.  Ext:   No deformity.  No mjr edema.  No cyanosis Skin: No petechiae / purpurea.  No major sores.  Warm and dry    Results:   Cultures: Recent Results (from the past 720 hour(s))  Resp Panel by RT-PCR (Flu A&B, Covid) Nasopharyngeal Swab     Status: None   Collection Time: 11/04/21  7:26 PM   Specimen: Nasopharyngeal Swab;  Nasopharyngeal(NP) swabs in vial transport medium  Result Value Ref Range Status   SARS Coronavirus 2 by RT PCR NEGATIVE NEGATIVE Final    Comment: (NOTE) SARS-CoV-2 target nucleic acids are NOT DETECTED.  The SARS-CoV-2 RNA is generally detectable in upper respiratory specimens during the acute phase of infection. The lowest concentration of SARS-CoV-2 viral copies this assay can detect is 138 copies/mL. A negative result does not preclude SARS-Cov-2 infection and should not be used as the sole basis for treatment or other patient management decisions. A negative result may occur with  improper specimen collection/handling, submission of specimen other than nasopharyngeal swab, presence of viral mutation(s) within the areas targeted by this assay, and inadequate number of viral copies(<138 copies/mL). A negative result must be combined with clinical observations, patient history, and epidemiological information. The expected result is Negative.  Fact Sheet for Patients:  EntrepreneurPulse.com.au  Fact Sheet for Healthcare Providers:  IncredibleEmployment.be  This test is no t yet approved or cleared by the [place] FDA and  has been authorized for detection and/or diagnosis of SARS-CoV-2 by FDA under an Emergency Use  Authorization (EUA). This EUA will remain  in effect (meaning this test can be used) for the duration of the COVID-19 declaration under Section 564(b)(1) of the Act, 21 U.S.C.section 360bbb-3(b)(1), unless the authorization is terminated  or revoked sooner.       Influenza A by PCR NEGATIVE NEGATIVE Final   Influenza B by PCR NEGATIVE NEGATIVE Final    Comment: (NOTE) The Xpert Xpress SARS-CoV-2/FLU/RSV plus assay is intended as an aid in the diagnosis of influenza from Nasopharyngeal swab specimens and should not be used as a sole basis for treatment. Nasal washings and aspirates are unacceptable for Xpert Xpress  SARS-CoV-2/FLU/RSV testing.  Fact Sheet for Patients: EntrepreneurPulse.com.au  Fact Sheet for Healthcare Providers: IncredibleEmployment.be  This test is not yet approved or cleared by the [place] FDA and has been authorized for detection and/or diagnosis of SARS-CoV-2 by FDA under an Emergency Use Authorization (EUA). This EUA will remain in effect (meaning this test can be used) for the duration of the COVID-19 declaration under Section 564(b)(1) of the Act, 21 U.S.C. section 360bbb-3(b)(1), unless the authorization is terminated or revoked.  Performed at [facility], [place] [address] [place], [place] [address]   Blood culture (routine x 2)     Status: None (Preliminary result)   Collection Time: 11/04/21  8:45 PM   Specimen: BLOOD  Result Value Ref Range Status   Specimen Description BLOOD LEFT ANTECUBITAL  Final   Special Requests   Final    BOTTLES DRAWN AEROBIC AND ANAEROBIC Blood Culture results may not be optimal due to an inadequate volume of blood received in culture bottles   Culture   Final    NO GROWTH 3 DAYS Performed at [facility], [place] [address], [place], [place] [address]    Report Status PENDING  Incomplete    Labs: Results for orders placed or performed during the hospital encounter of 11/04/21 (from the past 48 hour(s))  Basic metabolic panel Once     Status: Abnormal   Collection Time: 11/05/21  7:01 PM  Result Value Ref Range   Sodium 137 135 - 145 mmol/L   Potassium 3.0 (L) 3.5 - 5.1 mmol/L   Chloride 98 98 - 111 mmol/L   CO2 28 22 - 32 mmol/L   Glucose, Bld 84 70 - 99 mg/dL    Comment: Glucose reference range applies only to samples taken after fasting for at least 8 hours.   BUN 24 (H) 8 - 23 mg/dL   Creatinine, Ser 1.23 (H) 0.44 - 1.00 mg/dL   Calcium 8.7 (L) 8.9 - 10.3 mg/dL   GFR, Estimated 48 (L) >60 mL/min    Comment: (NOTE) Calculated using the CKD-EPI Creatinine Equation (2021)     Anion gap 11 5 - 15    Comment: Performed at [facility] [address], [place], [place] [address]  Magnesium     Status: None   Collection Time: 11/05/21  7:01 PM  Result Value Ref Range   Magnesium 2.1 1.7 - 2.4 mg/dL    Comment: Performed at [facility] [address] [place] [address]  CBC     Status: Abnormal   Collection Time: 11/06/21  2:06 AM  Result Value Ref Range   WBC 12.6 (H) 4.0 - 10.5 K/uL   RBC 4.41 3.87 - 5.11 MIL/uL   Hemoglobin 12.6 12.0 - 15.0 g/dL   HCT 39.7 36.0 - 46.0 %   MCV 90.0 80.0 -  100.0 fL   MCH 28.6 26.0 - 34.0 pg   MCHC 31.7 30.0 - 36.0 g/dL   RDW 13.9 11.5 - 15.5 %   Platelets 286 150 - 400 K/uL   nRBC 0.0 0.0 - 0.2 %    Comment: Performed at [facility], [place] [address], [place], [place] [address]  Basic metabolic panel     Status: Abnormal   Collection Time: 11/06/21  2:06 AM  Result Value Ref Range   Sodium 138 135 - 145 mmol/L   Potassium 3.2 (L) 3.5 - 5.1 mmol/L   Chloride 98 98 - 111 mmol/L   CO2 28 22 - 32 mmol/L   Glucose, Bld 75 70 - 99 mg/dL    Comment: Glucose reference range applies only to samples taken after fasting for at least 8 hours.   BUN 21 8 - 23 mg/dL   Creatinine, Ser 1.16 (H) 0.44 - 1.00 mg/dL   Calcium 8.5 (L) 8.9 - 10.3 mg/dL   GFR, Estimated 51 (L) >60 mL/min    Comment: (NOTE) Calculated using the CKD-EPI Creatinine Equation (2021)    Anion gap 12 5 - 15    Comment: Performed at [facility] [address], [place], [place] [address]  Basic metabolic panel     Status: Abnormal   Collection Time: 11/06/21  3:44 PM  Result Value Ref Range   Sodium 134 (L) 135 - 145 mmol/L   Potassium 3.1 (L) 3.5 - 5.1 mmol/L   Chloride 93 (L) 98 - 111 mmol/L   CO2 26 22 - 32 mmol/L   Glucose, Bld 89 70 - 99 mg/dL    Comment: Glucose reference range applies only to samples taken after fasting for at least 8 hours.   BUN 12 8 - 23 mg/dL   Creatinine, Ser 0.99 0.44 - 1.00 mg/dL    Calcium 8.9 8.9 - 10.3 mg/dL   GFR, Estimated >60 >60 mL/min    Comment: (NOTE) Calculated using the CKD-EPI Creatinine Equation (2021)    Anion gap 15 5 - 15    Comment: Performed at [facility] [address] [place], [place] [address]  CBC     Status: Abnormal   Collection Time: 11/07/21  1:32 AM  Result Value Ref Range   WBC 14.3 (H) 4.0 - 10.5 K/uL   RBC 4.39 3.87 - 5.11 MIL/uL   Hemoglobin 12.8 12.0 - 15.0 g/dL   HCT 39.1 36.0 - 46.0 %   MCV 89.1 80.0 - 100.0 fL   MCH 29.2 26.0 - 34.0 pg   MCHC 32.7 30.0 - 36.0 g/dL   RDW 14.1 11.5 - 15.5 %   Platelets 325 150 - 400 K/uL   nRBC 0.0 0.0 - 0.2 %    Comment: Performed at [facility], [place] [address], [place], [place] [address]  Basic metabolic panel     Status: Abnormal   Collection Time: 11/07/21  1:32 AM  Result Value Ref Range   Sodium 132 (L) 135 - 145 mmol/L   Potassium 3.8 3.5 - 5.1 mmol/L    Comment: DELTA CHECK NOTED   Chloride 95 (L) 98 - 111 mmol/L   CO2 24 22 - 32 mmol/L   Glucose, Bld 92 70 - 99 mg/dL    Comment: Glucose reference range applies only to samples taken after fasting for at least 8 hours.   BUN 18 8 - 23 mg/dL   Creatinine, Ser 1.21 (H) 0.44 -  1.00 mg/dL   Calcium 8.4 (L) 8.9 - 10.3 mg/dL   GFR, Estimated 49 (L) >60 mL/min    Comment: (NOTE) Calculated using the CKD-EPI Creatinine Equation (2021)    Anion gap 13 5 - 15    Comment: Performed at [facility], [place] [address], [place], [place] [address]    Imaging / Studies: CT ABDOMEN PELVIS W CONTRAST  Result Date: 11/06/2021 CLINICAL DATA:  Perforated sigmoid colon. EXAM: CT ABDOMEN AND PELVIS WITH CONTRAST TECHNIQUE: Multidetector CT imaging of the abdomen and pelvis was performed using the standard protocol following bolus administration of intravenous contrast. CONTRAST:  190mL OMNIPAQUE IOHEXOL 300 MG/ML  SOLN COMPARISON:  CT 11/04/2021 FINDINGS: Lower chest: No pleural fluid.  No pneumonia. Hepatobiliary: No portal  venous gas in the liver. Gallbladder is mildly distended to 4 cm. Single gallstone noted in the body of the gallbladder. No biliary duct dilatation. Pancreas: Pancreas is normal. No ductal dilatation. No pancreatic inflammation. Spleen: Normal spleen Adrenals/urinary tract: Adrenal glands normal. Small gas the hila the RIGHT kidney is favored direct extension from the retroperitoneum. LEFT kidney normal. No hydronephrosis. No gas in the bladder. Stomach/Bowel: Stomach is mildly distended by the oral contrast. Contrast flows into the duodenum. There is mild dilatation of the small bowel up to 3 cm. There multiple air-fluid levels. Favor ileus pattern. The appendix is normal. Terminal ileum grossly normal. Ascending colon and transverse colon normal. Descending colon normal. The sigmoid colon is tortuous. Loop of sigmoid colon which extends into the RIGHT lower quadrant again demonstrates extensive gas surrounding the sigmoid colon. There is bowel wall thickening the region (image 60/3). Findings are similar to comparison exam. The extent of the bowel wall thickening may be slightly increased. There is trace amount of free fluid in the pelvis which is similar to prior. There is mild enhancement of the surface of the peritoneum in the pelvis (image 68/3) . Extensive free air extends along the retroperitoneum paralleling the IVC to level the diaphragm. Volume gas is similar comparison exam. No discrete abscess formation. There is a peritoneal enhancement in the RIGHT iliac fossa additionally surrounding a small fluid collection (image 61/3). Rectum normal. Vascular/Lymphatic: Abdominal aorta is normal caliber. No periportal or retroperitoneal adenopathy. No pelvic adenopathy. Reproductive: Post hysterectomy.  Adnexa unremarkable Other: None Musculoskeletal: No aggressive osseous lesion. IMPRESSION: 1. Perforated sigmoid colon with mild progression of robust inflammatory response in bowel wall thickening in the RIGHT  lower quadrant. Extensive extra luminal gas surrounds sigmoid colon and retroperitoneum not changed in pattern from comparison exam. No organized abscess present. 2. Small volume intraperitoneal free fluid has associated thin peritoneal enhancement. Findings are consistent with early peritonitis. 3. Dilated loops of small bowel with air-fluid levels most consistent with ileus related to the inflammatory/infectious process in the RIGHT lower quadrant. Electronically Signed   By: Suzy Bouchard M.D.   On: 11/06/2021 14:53    Medications / Allergies: per chart  Antibiotics: Anti-infectives (From admission, onward)    Start     Dose/Rate Route Frequency Ordered Stop   11/05/21 0400  piperacillin-tazobactam (ZOSYN) IVPB 3.375 g        3.375 g 12.5 mL/hr over 240 Minutes Intravenous Every 8 hours 11/04/21 2018     11/04/21 2015  piperacillin-tazobactam (ZOSYN) IVPB 3.375 g        3.375 g 100 mL/hr over 30 Minutes Intravenous  Once 11/04/21 2009 11/04/21 2253         Note: Portions of this report  may have been transcribed using voice recognition software. Every effort was made to ensure accuracy; however, inadvertent computerized transcription errors may be present.   Any transcriptional errors that result from this process are unintentional.    Adin Hector, MD, FACS, MASCRS Esophageal, Gastrointestinal & Colorectal Surgery Robotic and Minimally Invasive Surgery  [facility], [place]  [place]. [address], [place], [place] [address] [phone] Fax [phone] Main  CONTACT INFORMATION:  Weekday (9AM-5PM): Call [facility] main office at [phone]  Weeknight (5PM-9AM) or Weekend/Holiday: Check [email or web address] (password " [email or web address]") for General Surgery [facility] coverage  (Please, do not use SecureChat as it is not reliable communication to operating surgeons for immediate patient care)      11/07/2021  10:24 AM

## 2021-11-08 ENCOUNTER — Encounter (HOSPITAL_COMMUNITY)
Admission: EM | Disposition: A | Discharge status: Home Health | Payer: Self-pay | Source: Home / Self Care | Attending: Internal Medicine

## 2021-11-08 ENCOUNTER — Inpatient Hospital Stay (HOSPITAL_COMMUNITY): Payer: Medicare Other | Admitting: Certified Registered Nurse Anesthetist

## 2021-11-08 ENCOUNTER — Encounter (HOSPITAL_COMMUNITY): Payer: Self-pay | Admitting: Internal Medicine

## 2021-11-08 DIAGNOSIS — K5720 Diverticulitis of large intestine with perforation and abscess without bleeding: Secondary | ICD-10-CM | POA: Diagnosis not present

## 2021-11-08 HISTORY — PX: COLON RESECTION SIGMOID: SHX6737

## 2021-11-08 HISTORY — PX: COLOSTOMY: SHX63

## 2021-11-08 LAB — BASIC METABOLIC PANEL
Anion gap: 10 (ref 5–15)
Anion gap: 9 (ref 5–15)
BUN: 10 mg/dL (ref 8–23)
BUN: 8 mg/dL (ref 8–23)
CO2: 26 mmol/L (ref 22–32)
CO2: 24 mmol/L (ref 22–32)
Calcium: 8.4 mg/dL — ABNORMAL LOW (ref 8.9–10.3)
Calcium: 8.4 mg/dL — ABNORMAL LOW (ref 8.9–10.3)
Chloride: 99 mmol/L (ref 98–111)
Chloride: 99 mmol/L (ref 98–111)
Creatinine, Ser: 0.97 mg/dL (ref 0.44–1.00)
Creatinine, Ser: 0.93 mg/dL (ref 0.44–1.00)
GFR, Estimated: >60 mL/min (ref >60)
GFR, Estimated: >60 mL/min (ref >60)
Glucose, Bld: 128 mg/dL — ABNORMAL HIGH (ref 70–99)
Glucose, Bld: 194 mg/dL — ABNORMAL HIGH (ref 70–99)
Potassium: 3.4 mmol/L — ABNORMAL LOW (ref 3.5–5.1)
Potassium: 5 mmol/L (ref 3.5–5.1)
Sodium: 135 mmol/L (ref 135–145)
Sodium: 132 mmol/L — ABNORMAL LOW (ref 135–145)

## 2021-11-08 LAB — CBC WITH DIFFERENTIAL/PLATELET
Abs Immature Granulocytes: 0.1 10*3/uL — ABNORMAL HIGH (ref 0.00–0.07)
Basophils Absolute: 0 10*3/uL (ref 0.0–0.1)
Basophils Relative: 0 %
Eosinophils Absolute: 0.2 10*3/uL (ref 0.0–0.5)
Eosinophils Relative: 3 %
HCT: 39.4 % (ref 36.0–46.0)
Hemoglobin: 12.5 g/dL (ref 12.0–15.0)
Lymphocytes Relative: 24 %
Lymphs Abs: 1.9 10*3/uL (ref 0.7–4.0)
MCH: 28.4 pg (ref 26.0–34.0)
MCHC: 31.7 g/dL (ref 30.0–36.0)
MCV: 89.5 fL (ref 80.0–100.0)
Metamyelocytes Relative: 1 %
Monocytes Absolute: 0.9 10*3/uL (ref 0.1–1.0)
Monocytes Relative: 11 %
Neutro Abs: 4.8 10*3/uL (ref 1.7–7.7)
Neutrophils Relative %: 61 %
Platelets: 343 10*3/uL (ref 150–400)
RBC: 4.4 MIL/uL (ref 3.87–5.11)
RDW: 14.1 % (ref 11.5–15.5)
WBC: 7.9 10*3/uL (ref 4.0–10.5)
nRBC: 0 % (ref 0.0–0.2)
nRBC: 0 /100 WBC

## 2021-11-08 LAB — MAGNESIUM: Magnesium: 1.6 mg/dL — ABNORMAL LOW (ref 1.7–2.4)

## 2021-11-08 LAB — HEMOGLOBIN A1C
Hgb A1c MFr Bld: 6 % — ABNORMAL HIGH (ref 4.8–5.6)
Mean Plasma Glucose: 125.5 mg/dL

## 2021-11-08 SURGERY — COLECTOMY, SIGMOID, OPEN
Anesthesia: General

## 2021-11-08 MED ORDER — POTASSIUM CHLORIDE 10 MEQ/100ML IV SOLN
10.0000 meq | INTRAVENOUS | Status: AC
  Administered 2021-11-08 (×3): 10 meq via INTRAVENOUS
  Filled 2021-11-08 (×3): qty 100

## 2021-11-08 MED ORDER — GLYCOPYRROLATE PF 0.2 MG/ML IJ SOSY
PREFILLED_SYRINGE | INTRAMUSCULAR | Status: AC
  Filled 2021-11-08: qty 1

## 2021-11-08 MED ORDER — PHENYLEPHRINE HCL-NACL 20-0.9 MG/250ML-% IV SOLN
INTRAVENOUS | Status: DC | PRN
  Administered 2021-11-08: 50 ug/min via INTRAVENOUS

## 2021-11-08 MED ORDER — PROPOFOL 10 MG/ML IV BOLUS
INTRAVENOUS | Status: DC | PRN
  Administered 2021-11-08: 120 mg via INTRAVENOUS

## 2021-11-08 MED ORDER — LIDOCAINE 2% (20 MG/ML) 5 ML SYRINGE
INTRAMUSCULAR | Status: AC
  Filled 2021-11-08: qty 5

## 2021-11-08 MED ORDER — ROCURONIUM BROMIDE 10 MG/ML (PF) SYRINGE
PREFILLED_SYRINGE | INTRAVENOUS | Status: DC | PRN
  Administered 2021-11-08: 5 mg via INTRAVENOUS
  Administered 2021-11-08: 70 mg via INTRAVENOUS

## 2021-11-08 MED ORDER — SODIUM CHLORIDE 0.9 % IV SOLN
INTRAVENOUS | Status: DC | PRN
  Administered 2021-11-08: 2 g via INTRAVENOUS

## 2021-11-08 MED ORDER — CHLORHEXIDINE GLUCONATE 0.12 % MT SOLN: 15.0000 mL | Freq: Once | OROMUCOSAL | Status: DC

## 2021-11-08 MED ORDER — LIDOCAINE 2% (20 MG/ML) 5 ML SYRINGE
INTRAMUSCULAR | Status: DC | PRN
  Administered 2021-11-08: 60 mg via INTRAVENOUS

## 2021-11-08 MED ORDER — CHLORHEXIDINE GLUCONATE CLOTH 2 % EX PADS
6.0000 | MEDICATED_PAD | Freq: Every day | CUTANEOUS | Status: DC
  Administered 2021-11-08 – 2021-11-12 (×4): 6 via TOPICAL

## 2021-11-08 MED ORDER — ORAL CARE MOUTH RINSE: 15.0000 mL | Freq: Once | OROMUCOSAL | Status: DC

## 2021-11-08 MED ORDER — HYDRALAZINE HCL 20 MG/ML IJ SOLN
INTRAMUSCULAR | Status: DC | PRN
  Administered 2021-11-08: 5 mg via INTRAVENOUS

## 2021-11-08 MED ORDER — DEXAMETHASONE SODIUM PHOSPHATE 10 MG/ML IJ SOLN
INTRAMUSCULAR | Status: AC
  Filled 2021-11-08: qty 1

## 2021-11-08 MED ORDER — ROCURONIUM BROMIDE 10 MG/ML (PF) SYRINGE
PREFILLED_SYRINGE | INTRAVENOUS | Status: AC
  Filled 2021-11-08: qty 10

## 2021-11-08 MED ORDER — MAGNESIUM SULFATE IN D5W 1-5 GM/100ML-% IV SOLN
1.0000 g | Freq: Once | INTRAVENOUS | Status: AC
  Administered 2021-11-08: 1 g via INTRAVENOUS
  Filled 2021-11-08: qty 100

## 2021-11-08 MED ORDER — MAGNESIUM SULFATE 2 GM/50ML IV SOLN
2.0000 g | Freq: Once | INTRAVENOUS | Status: AC
  Administered 2021-11-08: 2 g via INTRAVENOUS
  Filled 2021-11-08: qty 50

## 2021-11-08 MED ORDER — HYDROMORPHONE HCL 1 MG/ML IJ SOLN
0.5000 mg | INTRAMUSCULAR | Status: DC | PRN
Start: 2021-11-08 — End: 2021-11-10
  Administered 2021-11-08 – 2021-11-10 (×7): 1 mg via INTRAVENOUS
  Filled 2021-11-08 (×8): qty 1

## 2021-11-08 MED ORDER — FENTANYL CITRATE (PF) 250 MCG/5ML IJ SOLN
INTRAMUSCULAR | Status: DC | PRN
  Administered 2021-11-08 (×2): 50 ug via INTRAVENOUS
  Administered 2021-11-08: 25 ug via INTRAVENOUS
  Administered 2021-11-08: 50 ug via INTRAVENOUS

## 2021-11-08 MED ORDER — ONDANSETRON HCL 4 MG/2ML IJ SOLN
INTRAMUSCULAR | Status: AC
  Filled 2021-11-08: qty 2

## 2021-11-08 MED ORDER — PROPOFOL 10 MG/ML IV BOLUS
INTRAVENOUS | Status: AC
  Filled 2021-11-08: qty 20

## 2021-11-08 MED ORDER — PHENYLEPHRINE 40 MCG/ML (10ML) SYRINGE FOR IV PUSH (FOR BLOOD PRESSURE SUPPORT)
PREFILLED_SYRINGE | INTRAVENOUS | Status: AC
  Filled 2021-11-08: qty 10

## 2021-11-08 MED ORDER — ONDANSETRON HCL 4 MG/2ML IJ SOLN
INTRAMUSCULAR | Status: DC | PRN
  Administered 2021-11-08: 4 mg via INTRAVENOUS

## 2021-11-08 MED ORDER — CHLORHEXIDINE GLUCONATE 0.12 % MT SOLN
OROMUCOSAL | Status: AC
  Administered 2021-11-08: 15 mL
  Filled 2021-11-08: qty 15

## 2021-11-08 MED ORDER — GLYCOPYRROLATE 0.2 MG/ML IJ SOLN
INTRAMUSCULAR | Status: DC | PRN
  Administered 2021-11-08: .2 mg via INTRAVENOUS

## 2021-11-08 MED ORDER — FENTANYL CITRATE (PF) 250 MCG/5ML IJ SOLN
INTRAMUSCULAR | Status: AC
  Filled 2021-11-08: qty 5

## 2021-11-08 MED ORDER — DEXAMETHASONE SODIUM PHOSPHATE 10 MG/ML IJ SOLN
INTRAMUSCULAR | Status: DC | PRN
  Administered 2021-11-08: 8 mg via INTRAVENOUS

## 2021-11-08 MED ORDER — LACTATED RINGERS IV SOLN: INTRAVENOUS | Status: DC

## 2021-11-08 MED ORDER — 0.9 % SODIUM CHLORIDE (POUR BTL) OPTIME
TOPICAL | Status: DC | PRN
  Administered 2021-11-08 (×5): 1000 mL

## 2021-11-08 MED ORDER — POTASSIUM CHLORIDE 10 MEQ/100ML IV SOLN
10.0000 meq | INTRAVENOUS | Status: AC
  Administered 2021-11-08: 10 meq via INTRAVENOUS
  Filled 2021-11-08 (×2): qty 100

## 2021-11-08 MED ORDER — SUGAMMADEX SODIUM 200 MG/2ML IV SOLN
INTRAVENOUS | Status: DC | PRN
  Administered 2021-11-08: 200 mg via INTRAVENOUS

## 2021-11-08 SURGICAL SUPPLY — 51 items
APL PRP STRL LF DISP 70% ISPRP (MISCELLANEOUS) ×1
BAG COUNTER SPONGE SURGICOUNT (BAG) ×2 IMPLANT
BAG SPNG CNTER NS LX DISP (BAG) ×1
BNDG GAUZE ELAST 4 BULKY (GAUZE/BANDAGES/DRESSINGS) ×1 IMPLANT
CHLORAPREP W/TINT 26 (MISCELLANEOUS) ×2 IMPLANT
COVER SURGICAL LIGHT HANDLE (MISCELLANEOUS) ×2 IMPLANT
DRAIN CHANNEL 19F RND (DRAIN) ×1 IMPLANT
DRAPE LAPAROSCOPIC ABDOMINAL (DRAPES) ×2 IMPLANT
DRAPE WARM FLUID 44X44 (DRAPES) ×2 IMPLANT
DRSG OPSITE POSTOP 4X10 (GAUZE/BANDAGES/DRESSINGS) IMPLANT
DRSG OPSITE POSTOP 4X8 (GAUZE/BANDAGES/DRESSINGS) IMPLANT
ELECT BLADE 6.5 EXT (BLADE) ×2 IMPLANT
ELECT REM PT RETURN 9FT ADLT (ELECTROSURGICAL) ×2
ELECTRODE REM PT RTRN 9FT ADLT (ELECTROSURGICAL) ×1 IMPLANT
EVACUATOR SILICONE 100CC (DRAIN) ×1 IMPLANT
GAUZE SPONGE 4X4 12PLY STRL (GAUZE/BANDAGES/DRESSINGS) ×2 IMPLANT
GLOVE SRG 8 PF TXTR STRL LF DI (GLOVE) ×2 IMPLANT
GLOVE SURG ENC MOIS LTX SZ8 (GLOVE) ×4 IMPLANT
GLOVE SURG UNDER POLY LF SZ8 (GLOVE) ×4
GOWN STRL REUS W/ TWL LRG LVL3 (GOWN DISPOSABLE) ×4 IMPLANT
GOWN STRL REUS W/ TWL XL LVL3 (GOWN DISPOSABLE) ×2 IMPLANT
GOWN STRL REUS W/TWL LRG LVL3 (GOWN DISPOSABLE) ×2
GOWN STRL REUS W/TWL XL LVL3 (GOWN DISPOSABLE) ×2
HANDLE SUCTION POOLE (INSTRUMENTS) ×1 IMPLANT
KIT BASIN OR (CUSTOM PROCEDURE TRAY) ×2 IMPLANT
KIT OSTOMY DRAINABLE 2.75 STR (WOUND CARE) ×1 IMPLANT
KIT TURNOVER KIT B (KITS) ×2 IMPLANT
LIGASURE IMPACT 36 18CM CVD LR (INSTRUMENTS) ×1 IMPLANT
NS IRRIG 1000ML POUR BTL (IV SOLUTION) ×6 IMPLANT
PACK GENERAL/GYN (CUSTOM PROCEDURE TRAY) ×2 IMPLANT
PAD ABD 8X10 STRL (GAUZE/BANDAGES/DRESSINGS) ×2 IMPLANT
PAD ARMBOARD 7.5X6 YLW CONV (MISCELLANEOUS) ×2 IMPLANT
RELOAD PROXIMATE 75MM BLUE (ENDOMECHANICALS) ×2 IMPLANT
RELOAD STAPLE 75 3.8 BLU REG (ENDOMECHANICALS) IMPLANT
SPONGE T-LAP 18X18 ~~LOC~~+RFID (SPONGE) ×4 IMPLANT
STAPLER PROXIMATE 75MM BLUE (STAPLE) ×1 IMPLANT
STAPLER VISISTAT 35W (STAPLE) ×1 IMPLANT
SUCTION POOLE HANDLE (INSTRUMENTS) ×2
SUT ETHILON 2 0 FS 18 (SUTURE) ×1 IMPLANT
SUT PDS AB 1 TP1 96 (SUTURE) ×4 IMPLANT
SUT PROLENE 2 0 CT2 30 (SUTURE) ×1 IMPLANT
SUT PROLENE 2 0 KS (SUTURE) IMPLANT
SUT SILK 2 0 SH CR/8 (SUTURE) ×2 IMPLANT
SUT SILK 2 0 TIES 10X30 (SUTURE) ×2 IMPLANT
SUT SILK 3 0 SH CR/8 (SUTURE) ×2 IMPLANT
SUT SILK 3 0 TIES 10X30 (SUTURE) ×2 IMPLANT
SUT VIC AB 3-0 SH 18 (SUTURE) IMPLANT
SWAB COLLECTION DEVICE MRSA (MISCELLANEOUS) ×1 IMPLANT
SWAB CULTURE ESWAB REG 1ML (MISCELLANEOUS) ×1 IMPLANT
TOWEL GREEN STERILE (TOWEL DISPOSABLE) ×3 IMPLANT
TRAY FOLEY MTR SLVR 14FR STAT (SET/KITS/TRAYS/PACK) ×2 IMPLANT

## 2021-11-08 NOTE — Anesthesia Procedure Notes (Signed)
Procedure Name: Intubation Date/Time: 11/08/2021 8:20 AM Performed by: Inda Coke, CRNA Pre-anesthesia Checklist: Patient identified, Emergency Drugs available, Suction available and Patient being monitored Patient Re-evaluated:Patient Re-evaluated prior to induction Oxygen Delivery Method: Circle System Utilized Preoxygenation: Pre-oxygenation with 100% oxygen Induction Type: IV induction Ventilation: Mask ventilation without difficulty Laryngoscope Size: Mac and 3 Grade View: Grade I Tube type: Oral Tube size: 7.0 mm Number of attempts: 1 Airway Equipment and Method: Stylet and Oral airway Placement Confirmation: ETT inserted through vocal cords under direct vision, positive ETCO2 and breath sounds checked- equal and bilateral Secured at: 22 cm Tube secured with: Tape Dental Injury: Teeth and Oropharynx as per pre-operative assessment

## 2021-11-08 NOTE — Progress Notes (Signed)
Patient to 4E08 from PACU. On monitor vital signs obtained. Dressings clean, dry, and intact. Alert and oriented to room and call light. Call bell within reach.  Era Bumpers, RN

## 2021-11-08 NOTE — Progress Notes (Signed)
Subjective:  No acute overnight events. Patient was seen at bedside after surgery this morning. She was sedated and only able to answer a few questions. She complains of some abdominal soreness. No other complaints at this time.    Objective:  Vital signs in last 24 hours: Vitals:   11/07/21 2110 11/08/21 0332 11/08/21 0747 11/08/21 1020  BP: (!) 169/90 (!) 168/93 (!) 222/104 (!) 185/80  Pulse: 83 76 75 61  Resp: 18 17 18 14   Temp:  (!) 97.4 F (36.3 C) 97.9 F (36.6 C) 97.9 F (36.6 C)  TempSrc:  Oral Oral   SpO2: 96% 99% 97% 100%  Weight:  79.8 kg    Height:       General Physical Exam Constitutional: sedated, in no acute distress HENT: normocephalic, atraumatic, mucous membranes moist Eyes: conjunctiva non-erythematous, extraocular movements intact Neck: supple Cardiovascular: regular rate and irregular rhythm, no m/r/g Pulmonary/Chest: normal work of breathing on room air, lungs clear to auscultation bilaterally Abdominal: distended, dressing over incisions noted. drain in place with serosanguinous fluid collection. Genitourinary: Foley in place.    MSK: normal bulk and tone Neurological: alert & oriented x 3  Skin: warm and dry Psych: normal behavior, normal affect   Assessment/Plan:  Principal Problem:   Perforation of sigmoid colon due to diverticulitis Active Problems:   Hypokalemia   Pulmonary hypertension ([facility])   Hypertension   Perforated diverticulum   AKI (acute kidney injury) ([facility])   Chronic back pain   Chronic, continuous use of opioids  Chelsea [name] is a 70 yo F with a history of HTN, HLD, GERD,  thoracic aortic aneurysm, mild aortic regurgitation, chronic pain on opioids, CKD, prior CVA and obesity presenting with abdominal pain and admitted for perforated diverticulitis s/p surgical intervention.  Perforated diverticulitis-worsening Pt had her surgical procedure today. Surgeon noted a 2 cm perforation in the colon with a localized abscess  and feculent peritonitis. Sent cultures of the abscess. Colostomy is in place and viable. On exam, noted drain with serosanguinous fluid drainage. Will continue to monitor. -Surgery following, appreciate recs. -Await abscess culture results. -follow up with pathology and cytology results.  -continue IV zosyn -IV Dilaudid 0.5mg  every 4 hours as needed for moderate pain -Tylenol 650 mg every 6 hours as needed for mild pain/fever -4 mg zofran Q6H PRN for nausea -Monitor CBC and BMP -Will keep pt NPO for now and defer to surgery for recommendations on advancing diet.   Acute Kidney Injury 2/2 dehydration and contrast induced nephropathy Serum creatinine decreased from 1.21 to 0.97 after fluids. Pt took oral contrast yesterday while being kept NPO and has had decreased PO intake. Will continue to trend kidney function. -received 1L NS -continue D5-1/2NS @75cc /hr -Check BMP daily  Resistant Hypertension Aortic aneurysm 4 cm-stable Patient has a history of resistant hypertension and currently on 6 agent medication regimen. Chest CT obtained March 2021 confirmed thoracic aortic aneurysm measuring 4 cm in diameter. Today SBPs from 95 to 127 while on regimen of labetalol 10 mg Q6H and hydralazine 5 mg Q3H PRN to maintain SBP<180. Goal is to maintain SBP <180. Pt is asymptomatic at this time.  -Continue 0.2 mg clonidine patch weekly -Changing labetalol dosage to 5 mg Q6H -continue hydralazine 10 mg Q3H PRN to maintain SBP<180 -Hold all other home HTN regimen.  Atrial Tachycardia-improving EKG reveals atrial tachycardia with LVH likely secondary to sepsis possibly due to perforated diverticulitis. Pt does not have a history of atrial fibrillation or  history of arrhythmias. She denies any chest pain or palpations at this time. Pt was started on labetalol for BP control yesterday and her HR is improved likely due to this today. HR in 80s. -Continue telemetry  Mild hypokalemia and  hypomagnesemia Potassium levels rising with latest value of 3.4. Mild decrease in Mg of 1.6. Patient has been NPO for surgery and has had decreased oral intake prior to that. She received K and Mg infusions overnight.   -Will recheck BMP and replete PRN     Best Practice: Diet: NPO IVF: NS,75cc/hr VTE: Enoxaparin Code: Full  Signature: Claudean Kinds, [facility]  Pager: [phone] 10:36 AM, 11/08/2021

## 2021-11-08 NOTE — Anesthesia Preprocedure Evaluation (Addendum)
Anesthesia Evaluation  Patient identified by MRN, date of birth, ID band Patient awake    Reviewed: Allergy & Precautions, H&P , NPO status , Patient's Chart, lab work & pertinent test results  Airway Mallampati: III  TM Distance: >3 FB Neck ROM: Full    Dental no notable dental hx. (+) Teeth Intact, Dental Advisory Given   Pulmonary Current Smoker and Patient abstained from smoking.,    Pulmonary exam normal breath sounds clear to auscultation       Cardiovascular hypertension, Pt. on medications and Pt. on home beta blockers  Rhythm:Regular Rate:Normal     Neuro/Psych  Headaches, Anxiety Depression CVA, Residual Symptoms    GI/Hepatic Neg liver ROS, hiatal hernia, GERD  Medicated,  Endo/Other  negative endocrine ROS  Renal/GU negative Renal ROS  negative genitourinary   Musculoskeletal  (+) Arthritis , Osteoarthritis,    Abdominal   Peds  Hematology negative hematology ROS (+)   Anesthesia Other Findings   Reproductive/Obstetrics negative OB ROS                            Anesthesia Physical Anesthesia Plan  ASA: 3  Anesthesia Plan: General   Post-op Pain Management:    Induction: Intravenous  PONV Risk Score and Plan: 3 and Ondansetron, Dexamethasone and Midazolam  Airway Management Planned: Oral ETT  Additional Equipment:   Intra-op Plan:   Post-operative Plan: Extubation in OR  Informed Consent: I have reviewed the patients History and Physical, chart, labs and discussed the procedure including the risks, benefits and alternatives for the proposed anesthesia with the patient or authorized representative who has indicated his/her understanding and acceptance.     Dental advisory given  Plan Discussed with: CRNA  Anesthesia Plan Comments:         Anesthesia Quick Evaluation

## 2021-11-08 NOTE — Anesthesia Postprocedure Evaluation (Signed)
Anesthesia Post Note  Patient: Chelsea [name]  Procedure(s) Performed: COLON RESECTION SIGMOID; DRAINAGE OF INTRAABDOMINAL ABSCESS COLOSTOMY     Patient location during evaluation: PACU Anesthesia Type: General Level of consciousness: awake and alert Pain management: pain level controlled Vital Signs Assessment: post-procedure vital signs reviewed and stable Respiratory status: spontaneous breathing, nonlabored ventilation and respiratory function stable Cardiovascular status: blood pressure returned to baseline and stable Postop Assessment: no apparent nausea or vomiting Anesthetic complications: no   No notable events documented.  Last Vitals:  Vitals:   11/08/21 1050 11/08/21 1117  BP: (!) 155/75 (!) 144/75  Pulse: 68 72  Resp: (!) 24 20  Temp: 36.7 C   SpO2: 98% 96%    Last Pain:  Vitals:   11/08/21 1117  TempSrc:   PainSc: Asleep                 Sabre Leonetti,W. EDMOND

## 2021-11-08 NOTE — Transfer of Care (Signed)
Immediate Anesthesia Transfer of Care Note  Patient: Chelsea [name]  Procedure(s) Performed: COLON RESECTION SIGMOID; DRAINAGE OF INTRAABDOMINAL ABSCESS COLOSTOMY  Patient Location: PACU  Anesthesia Type:General  Level of Consciousness: awake and drowsy  Airway & Oxygen Therapy: Patient Spontanous Breathing and Patient connected to face mask oxygen  Post-op Assessment: Report given to RN and Post -op Vital signs reviewed and stable  Post vital signs: Reviewed and stable  Last Vitals:  Vitals Value Taken Time  BP 185/80 11/08/21 1020  Temp    Pulse 64 11/08/21 1025  Resp 19 11/08/21 1025  SpO2 100 % 11/08/21 1025  Vitals shown include unvalidated device data.  Last Pain:  Vitals:   11/08/21 0747  TempSrc: Oral  PainSc:       Patients Stated Pain Goal: 0 (03/49/61 1643)  Complications: No notable events documented.

## 2021-11-08 NOTE — Progress Notes (Signed)
Patient ID: Chelsea [name], female   DOB: 07/30/1951, 70 y.o.   MRN: [id number] Day of Surgery   Subjective: RLQ pain ROS negative except as listed above. Objective: Vital signs in last 24 hours: Temp:  [97.3 F (36.3 C)-98.6 F (37 C)] 97.4 F (36.3 C) (12/11 0332) Pulse Rate:  [75-87] 76 (12/11 0332) Resp:  [16-25] 17 (12/11 0332) BP: (159-189)/(82-100) 168/93 (12/11 0332) SpO2:  [95 %-100 %] 99 % (12/11 0332) Weight:  [79.8 kg] 79.8 kg (12/11 0332) Last BM Date: 11/07/21  Intake/Output from previous day: 12/10 0701 - 12/11 0700 In: 982.9 [P.O.:596; IV Piggyback:386.9] Out: -  Intake/Output this shift: No intake/output data recorded.  General appearance: cooperative Resp: clear to auscultation bilaterally Cardio: regular rate and rhythm GI: soft, very tender RLQ Extremities: calves soft  Lab Results: CBC  Recent Labs    11/07/21 0132 11/08/21 0127  WBC 14.3* 7.9  HGB 12.8 12.5  HCT 39.1 39.4  PLT 325 343   BMET Recent Labs    11/07/21 1613 11/08/21 0127  NA 136 135  K 3.1* 3.4*  CL 99 99  CO2 25 26  GLUCOSE 92 128*  BUN 14 10  CREATININE 0.96 0.97  CALCIUM 8.8* 8.4*   PT/INR No results for input(s): LABPROT, INR in the last 72 hours. ABG No results for input(s): PHART, HCO3 in the last 72 hours.  Invalid input(s): PCO2, PO2  Studies/Results: CT ABDOMEN PELVIS W CONTRAST  Result Date: 11/06/2021 CLINICAL DATA:  Perforated sigmoid colon. EXAM: CT ABDOMEN AND PELVIS WITH CONTRAST TECHNIQUE: Multidetector CT imaging of the abdomen and pelvis was performed using the standard protocol following bolus administration of intravenous contrast. CONTRAST:  139mL OMNIPAQUE IOHEXOL 300 MG/ML  SOLN COMPARISON:  CT 11/04/2021 FINDINGS: Lower chest: No pleural fluid.  No pneumonia. Hepatobiliary: No portal venous gas in the liver. Gallbladder is mildly distended to 4 cm. Single gallstone noted in the body of the gallbladder. No biliary duct dilatation. Pancreas:  Pancreas is normal. No ductal dilatation. No pancreatic inflammation. Spleen: Normal spleen Adrenals/urinary tract: Adrenal glands normal. Small gas the hila the RIGHT kidney is favored direct extension from the retroperitoneum. LEFT kidney normal. No hydronephrosis. No gas in the bladder. Stomach/Bowel: Stomach is mildly distended by the oral contrast. Contrast flows into the duodenum. There is mild dilatation of the small bowel up to 3 cm. There multiple air-fluid levels. Favor ileus pattern. The appendix is normal. Terminal ileum grossly normal. Ascending colon and transverse colon normal. Descending colon normal. The sigmoid colon is tortuous. Loop of sigmoid colon which extends into the RIGHT lower quadrant again demonstrates extensive gas surrounding the sigmoid colon. There is bowel wall thickening the region (image 60/3). Findings are similar to comparison exam. The extent of the bowel wall thickening may be slightly increased. There is trace amount of free fluid in the pelvis which is similar to prior. There is mild enhancement of the surface of the peritoneum in the pelvis (image 68/3) . Extensive free air extends along the retroperitoneum paralleling the IVC to level the diaphragm. Volume gas is similar comparison exam. No discrete abscess formation. There is a peritoneal enhancement in the RIGHT iliac fossa additionally surrounding a small fluid collection (image 61/3). Rectum normal. Vascular/Lymphatic: Abdominal aorta is normal caliber. No periportal or retroperitoneal adenopathy. No pelvic adenopathy. Reproductive: Post hysterectomy.  Adnexa unremarkable Other: None Musculoskeletal: No aggressive osseous lesion. IMPRESSION: 1. Perforated sigmoid colon with mild progression of robust inflammatory response in bowel wall thickening in  the RIGHT lower quadrant. Extensive extra luminal gas surrounds sigmoid colon and retroperitoneum not changed in pattern from comparison exam. No organized abscess present.  2. Small volume intraperitoneal free fluid has associated thin peritoneal enhancement. Findings are consistent with early peritonitis. 3. Dilated loops of small bowel with air-fluid levels most consistent with ileus related to the inflammatory/infectious process in the RIGHT lower quadrant. Electronically Signed   By: Suzy Bouchard M.D.   On: 11/06/2021 14:53    Anti-infectives: Anti-infectives (From admission, onward)    Start     Dose/Rate Route Frequency Ordered Stop   11/07/21 1400  neomycin (MYCIFRADIN) tablet 1,000 mg       See Hyperspace for full Linked Orders Report.   1,000 mg Oral 3 times per day 11/07/21 1111 11/07/21 2254   11/07/21 1400  metroNIDAZOLE (FLAGYL) tablet 1,000 mg       See Hyperspace for full Linked Orders Report.   1,000 mg Oral 3 times per day 11/07/21 1111 11/07/21 2100   11/07/21 1200  cefoTEtan (CEFOTAN) 2 g in sodium chloride 0.9 % 100 mL IVPB        2 g 200 mL/hr over 30 Minutes Intravenous On call to O.R. 11/07/21 1111 11/08/21 0559   11/05/21 0400  [MAR Hold]  piperacillin-tazobactam (ZOSYN) IVPB 3.375 g        (MAR Hold since Sun 11/08/2021 at 0721.Hold Reason: Transfer to a Procedural area)   3.375 g 12.5 mL/hr over 240 Minutes Intravenous Every 8 hours 11/04/21 2018     11/04/21 2015  piperacillin-tazobactam (ZOSYN) IVPB 3.375 g        3.375 g 100 mL/hr over 30 Minutes Intravenous  Once 11/04/21 2009 11/04/21 2253       Assessment/Plan: Perforated sigmoid diverticulitis worse despite medical management - for sigmoid colectomy, colostomy. Procedure, risks, and benefits have been discussed. I answered her questions and she agrees. Zosyn IV.   LOS: 4 days    Georganna Skeans, MD, MPH, FACS Trauma & General Surgery Use [email or web address] to contact on call provider  11/08/2021

## 2021-11-08 NOTE — Op Note (Signed)
11/04/2021 - 11/08/2021  10:05 AM  PATIENT:  Chelsea [name]  70 y.o. female  PRE-OPERATIVE DIAGNOSIS:  Perforated Sigmoid Diverticulitis  POST-OPERATIVE DIAGNOSIS:  Perforated Sigmoid Diverticulitis, feculent peritonitis and intra-abdominal abscess at time of surgery  PROCEDURE:  Procedure(s): COLON RESECTION SIGMOID DRAINAGE OF INTRAABDOMINAL ABSCESS HARTMAN'S PROCEDURE WITH COLOSTOMY  SURGEON:  Surgeon(s): Georganna Skeans, MD  ASSISTANTS: none   ANESTHESIA:   general  EBL:  Total I/O In: 1300 [I.V.:1200; IV Piggyback:100] Out: 575 [Urine:500; Blood:75]  BLOOD ADMINISTERED:none  DRAINS: (1) Jackson-Pratt drain(s) with closed bulb suction in the pelvis    SPECIMEN:  Excision  DISPOSITION OF SPECIMEN:  PATHOLOGY  COUNTS:  YES  DICTATION: .Dragon Dictation Procedure in detail: Upon entering the abdomen (organ space), I encountered feculent peritonitis.  CASE DATA:  Type of patient?: DOW CASE (Surgical Hospitalist [facility])  Status of Case? URGENT Add On  Infection Present At Time Of Surgery (PATOS)?  FECULENT PERITONITIS AND ABSCESS R PELVIS  Informed consent was obtained.  The patient is receiving intravenous antibiotics.  She was brought to the operating room and general endotracheal anesthesia was administered by the anesthesia staff.  Foley catheter was placed by nursing.  Her abdomen was prepped and draped in a sterile fashion.  We did a timeout procedure.  I made a midline incision starting low and extending above the umbilicus.  Subcutaneous tissues were dissected down using cautery.  The fascia was divided along the midline and the peritoneal cavity was carefully opened under direct vision.  The fascia was opened to the length of the incision.  There were no significant adhesions to the anterior abdominal wall.  I then explored the abdomen and encountered the severe inflammatory process in the right lower quadrant.  There was a perforated segment of  diverticulitis with at least a 2 cm perforation in the colon.  There was localized abscess and feculent peritonitis.  I bluntly dissected the diseased colon off of the cecum and some loops of small bowel that were adherent.  This was accomplished without difficulty.  There was a portion of omentum which was densely adherent to the diseased segment of sigmoid colon I used the LigaSure to divide the omentum proximal to that area and achieved good hemostasis.  I then further mobilized this perforated segment of colon up out of the pelvis.  I sent cultures of the intra-abdominal abscess.  I then divided the colon with a GIA 75 stapler proximally and distally to the perforated diseased segment.  The LigaSure and interrupted 2-0 silk sutures were used take down the mesentery with good hemostasis.   I stayed close to the colon and avoided the ureter on both sides. I then further mobilized the proximal sigmoid colon and distal descending colon from lateral peritoneal attachments in order to facilitate creation of colostomy.  I copiously irrigated the pelvis and inspected that area again.  I ran the small bowel back from the terminal ileum back to the ligament of Treitz.  The small bowel was somewhat dilated proximal to where the small bowel was adherent to the diseased sigmoid colon but it was completely viable without significant damage.  The proximal colon mobilized nicely and I selected an area in the left lower quadrant for an ostomy site.  I made a circular incision and cored out the fat using cautery.  I made a cruciate incision in the fascia while retracting medially with a kocher.  The opening the fascia was enlarged in order to pass out the  colostomy.  The colostomy was pink and viable.  The abdomen was then further irrigated.  We changed our gloves.  I placed a 61 French drain down in the pelvis and secured it with a nylon suture exiting the right lower quadrant.  Laparotomy counts were confirmed correct and the  bowel was appropriately positioned.  The omentum was brought down over the top of the bowel and I closed the midline fascia with 2 lengths of #1 looped PDS tied in the middle.  Sterile wet-to-dry dressing was placed.  Matured the colostomy with interrupted 3-0 Vicryl sutures.  It was pink and viable and patent.  There was good hemostasis.  Ostomy appliance was applied.  All counts were correct.  She tolerated the procedure well without apparent complication and was taken recovery in stable condition.  PATIENT DISPOSITION:  PACU - hemodynamically stable.   Delay start of Pharmacological VTE agent (>24hrs) due to surgical blood loss or risk of bleeding:  no  Georganna Skeans, MD, MPH, FACS Pager: [phone]  12/11/202210:05 AM

## 2021-11-09 ENCOUNTER — Encounter (HOSPITAL_COMMUNITY): Payer: Self-pay | Admitting: General Surgery

## 2021-11-09 DIAGNOSIS — N179 Acute kidney failure, unspecified: Secondary | ICD-10-CM

## 2021-11-09 LAB — BASIC METABOLIC PANEL
Anion gap: 10 (ref 5–15)
BUN: 11 mg/dL (ref 8–23)
CO2: 25 mmol/L (ref 22–32)
Calcium: 8.5 mg/dL — ABNORMAL LOW (ref 8.9–10.3)
Chloride: 98 mmol/L (ref 98–111)
Creatinine, Ser: 1.23 mg/dL — ABNORMAL HIGH (ref 0.44–1.00)
GFR, Estimated: 48 mL/min — ABNORMAL LOW (ref >60)
Glucose, Bld: 133 mg/dL — ABNORMAL HIGH (ref 70–99)
Potassium: 4.7 mmol/L (ref 3.5–5.1)
Sodium: 133 mmol/L — ABNORMAL LOW (ref 135–145)

## 2021-11-09 LAB — CBC WITH DIFFERENTIAL/PLATELET
Abs Immature Granulocytes: 0.08 10*3/uL — ABNORMAL HIGH (ref 0.00–0.07)
Basophils Absolute: 0 10*3/uL (ref 0.0–0.1)
Basophils Relative: 0 %
Eosinophils Absolute: 0 10*3/uL (ref 0.0–0.5)
Eosinophils Relative: 0 %
HCT: 44.1 % (ref 36.0–46.0)
Hemoglobin: 14.5 g/dL (ref 12.0–15.0)
Immature Granulocytes: 1 %
Lymphocytes Relative: 5 %
Lymphs Abs: 0.8 10*3/uL (ref 0.7–4.0)
MCH: 29.4 pg (ref 26.0–34.0)
MCHC: 32.9 g/dL (ref 30.0–36.0)
MCV: 89.3 fL (ref 80.0–100.0)
Monocytes Absolute: 0.8 10*3/uL (ref 0.1–1.0)
Monocytes Relative: 5 %
Neutro Abs: 13.8 10*3/uL — ABNORMAL HIGH (ref 1.7–7.7)
Neutrophils Relative %: 89 %
Platelets: 489 10*3/uL — ABNORMAL HIGH (ref 150–400)
RBC: 4.94 MIL/uL (ref 3.87–5.11)
RDW: 14.5 % (ref 11.5–15.5)
WBC: 15.4 10*3/uL — ABNORMAL HIGH (ref 4.0–10.5)
nRBC: 0 % (ref 0.0–0.2)

## 2021-11-09 LAB — CULTURE, BLOOD (ROUTINE X 2): Culture: NO GROWTH

## 2021-11-09 LAB — MAGNESIUM: Magnesium: 2.1 mg/dL (ref 1.7–2.4)

## 2021-11-09 MED ORDER — METHOCARBAMOL 500 MG PO TABS
500.0000 mg | ORAL_TABLET | Freq: Three times a day (TID) | ORAL | Status: DC
  Administered 2021-11-09 (×3): 500 mg via ORAL
  Filled 2021-11-09 (×4): qty 1

## 2021-11-09 MED ORDER — ACETAMINOPHEN 500 MG PO TABS
1000.0000 mg | ORAL_TABLET | Freq: Four times a day (QID) | ORAL | Status: DC
  Administered 2021-11-09 – 2021-11-13 (×15): 1000 mg via ORAL
  Filled 2021-11-09 (×17): qty 2

## 2021-11-09 MED ORDER — LABETALOL HCL 5 MG/ML IV SOLN
10.0000 mg | Freq: Four times a day (QID) | INTRAVENOUS | Status: DC
Start: 2021-11-09 — End: 2021-11-10
  Administered 2021-11-09 – 2021-11-10 (×4): 10 mg via INTRAVENOUS
  Filled 2021-11-09 (×3): qty 4

## 2021-11-09 MED ORDER — DEXTROSE-NACL 5-0.45 % IV SOLN: INTRAVENOUS | Status: DC

## 2021-11-09 NOTE — Progress Notes (Signed)
   Subjective:  No acute overnight events.  Reports doing okay this morning. Having some stomach pain when she coughs or hiccups and some bloating, but otherwise reports improvement since yesterday. Has been tolerating ice chips.   Objective:  Vital signs in last 24 hours: Vitals:   11/09/21 0431 11/09/21 0843 11/09/21 1107 11/09/21 1130  BP: (!) 141/79 (!) 158/86 (!) 203/91 (!) 162/82  Pulse: 73 70  80  Resp: 12 14  10   Temp: (!) 97.3 F (36.3 C) (!) 97.3 F (36.3 C)  (!) 97.5 F (36.4 C)  TempSrc: Oral Oral  Oral  SpO2: 95% 95%  95%  Weight: 80 kg     Height:       Physical Exam: General: well-appearing elderly female, lying in bed, NAD. CV: normal rate and regular rhythm, no m/r/g. Pulm: normal work of breathing, no respiratory distress noted. Abdomen: Distended abdomen with TTP around incision site. Absent bowel sounds. Midline wound is clean and dry. Colostomy with viable stoma, no output noted.  GU: foley catheter in place. Neuro: AAOx3, no focal deficits noted. Skin: warm and dry.  Assessment/Plan:  Principal Problem:   Perforation of sigmoid colon due to diverticulitis Active Problems:   Hypokalemia   Pulmonary hypertension (HCC)   Hypertension   Perforated diverticulum   AKI (acute kidney injury) (HCC)   Chronic back pain   Chronic, continuous use of opioids  Chelsea [name] is a 70 yo F with a history of HTN, HLD, GERD,  thoracic aortic aneurysm, mild aortic regurgitation, chronic pain on opioids, CKD, prior CVA and obesity presenting with abdominal pain and admitted for perforated diverticulitis, now on post-op day 1 s/p Hartmann's procedure.  Perforated diverticulitis s/p Hartmann's Procedure (PO Day 1) Underwent partial colectomy with end colostomy yesterday with Dr. Grandville Silos. Tolerated procedure well and remains HDS. No bowel sounds heard today, likely has a post-op ileus given manipulation of bowels during surgery. Otherwise, pain is well controlled  with dilaudid, but this may in turn delay return of bowel function. Preliminary anaerobic cultures showing few GPC in clusters, few GPR, and few GNR. Currently on IV zosyn. -appreciate surgery recommendations -NPO until ileus resolves -f/u pathology results -continue IV zosyn for 5 days post-op (day 1/5) -IV dilaudid for pain -D5-1/2NS @125cc /hr -trend CBC and BMP -ambulate as tolerated -PT/OT eval -WOC consulted for colostomy care and midline wound care  Acute Kidney Injury Likely pre-renal 2/2 decreased PO intake and recent surgery. Increased maintenance fluids to 125cc/hr. -continue D5-1/2NS @125cc /hr -trend kidney function -avoid nephrotoxic medications -strict I/O's  Resistant Hypertension Aortic aneurysm 4 cm - stable Patient has a history of resistant hypertension and currently on 6 agent medication regimen at home. Chest CT obtained March 2021 confirmed thoracic aortic aneurysm measuring 4 cm in diameter. Blood pressures well controlled today, transition to home PO antihypertensives once able to tolerate PO intake. -Continue 0.2 mg clonidine patch weekly -IV labetalol 5 mg Q6H -continue hydralazine 10 mg Q3H PRN to maintain SBP<180 -holding home BP meds until can tolerate PO intake  Atrial Tachycardia - resolved EKG reveals atrial tachycardia with LVH likely secondary to sepsis possibly due to perforated diverticulitis. Seems to have resolved after starting IV labetalol. HR in 70s-80s today.    Best Practice: Diet: NPO IVF: NS,75cc/hr VTE: Enoxaparin Code: Full  Signature: Chelsea [name], PGY-2 Chelsea [name] Internal Medicine Residency  Pager: [phone] 12:04 PM, 11/09/2021

## 2021-11-09 NOTE — TOC Initial Note (Signed)
Transition of Care [facility]) - Initial/Assessment Note    Patient Details  Name: Chelsea [name] MRN: [id number] Date of Birth: 1951-05-15  Transition of Care [facility]) CM/SW Contact:    Milas Gain, [place] Phone Number: 11/09/2021, 2:15 PM  Clinical Narrative:                  CSW received consult for possible SNF placement at time of discharge. CSW spoke with patient at bedside regarding PT recommendation of SNF placement at time of discharge. Patient expressed understanding of PT recommendation and is agreeable to SNF placement at time of discharge. Patient gave CSW permission to fax out initial referral near the [place] area.CSW discussed insurance authorization process and will provide Medicare SNF ratings list with accepted SNF bed offers when available. Patient reports she has received both COVID vaccines as well as 2 boosters.  No further questions reported at this time. CSW to continue to follow and assist with discharge planning needs.   Expected Discharge Plan: Skilled Nursing Facility Barriers to Discharge: Continued Medical Work up   Patient Goals and CMS Choice Patient states their goals for this hospitalization and ongoing recovery are:: SNF CMS Medicare.gov Compare Post Acute Care list provided to:: Patient Choice offered to / list presented to : Patient  Expected Discharge Plan and Services Expected Discharge Plan: [place] In-house Referral: Clinical Social Work     Living arrangements for the past 2 months: Single Family Home                                      Prior Living Arrangements/Services Living arrangements for the past 2 months: Single Family Home Lives with:: Self, Adult Children (from home with daughter) Patient language and need for interpreter reviewed:: Yes Do you feel safe going back to the place where you live?: No   SNF  Need for Family Participation in Patient Care: Yes (Comment) Care giver support system in place?:  Yes (comment)   Criminal Activity/Legal Involvement Pertinent to Current Situation/Hospitalization: No - Comment as needed  Activities of Daily Living      Permission Sought/Granted Permission sought to share information with : Case Manager, Customer service manager, Family Supports Permission granted to share information with : Yes, Verbal Permission Granted  Share Information with NAME: Linton Rump  Permission granted to share info w AGENCY: SNF  Permission granted to share info w Relationship: son  Permission granted to share info w Contact Information: Linton Rump [phone]  Emotional Assessment Appearance:: Appears stated age Attitude/Demeanor/Rapport: Gracious Affect (typically observed): Calm Orientation: : Oriented to Self, Oriented to Place, Oriented to  Time, Oriented to Situation (WDL) Alcohol / Substance Use: Not Applicable Psych Involvement: No (comment)  Admission diagnosis:  Hypokalemia [E87.6] Bowel perforation (HCC) [K63.1] Atrial fibrillation with RVR ([place]) [I48.91] Patient Active Problem List   Diagnosis Date Noted   Perforation of sigmoid colon due to diverticulitis 11/07/2021   Perforated diverticulum 11/04/2021   AKI (acute kidney injury) ([place]) 11/04/2021   Chronic back pain 11/04/2021   Chronic, continuous use of opioids 11/04/2021   Nutritional counseling 01/19/2018   Primary osteoarthritis of left knee 01/03/2016   Mild to Moderate Aortic Insufficiency    Hypertension    Hyperlipidemia    Morbid obesity ([place])    Pulmonary hypertension ([place]) 01/30/2013   Orthostatic hypotension 12/18/2012   Aortic valve disorder 07/29/2010   CHEST PAIN-PRECORDIAL 07/29/2010   CYST  OF THYROID 01/06/2010   Hypokalemia 01/06/2010   HYPERLIPIDEMIA 12/24/2009   OBESITY 12/24/2009   HYPERTENSION, UNSPECIFIED 12/24/2009   Personal history of unspecified circulatory disease 12/24/2009   PEPTIC ULCER DISEASE, HX OF 12/24/2009   PCP:  Trey Sailors,  [place] Pharmacy:   [facility] ([place], [place] [facility] [address] [place] [address] [place] [address] Phone: [phone] Fax: [place], [place] - [address] [place] [address] Phone: [phone] Fax: [phone]  [facility] ([facility] ) - [place], [address] [place] [address] Phone: [phone] Fax: [phone]     Social Determinants of Health (SDOH) Interventions    Readmission Risk Interventions No flowsheet data found.

## 2021-11-09 NOTE — Care Management Important Message (Signed)
Important Message  Patient Details  Name: Chelsea [name] MRN: [id number] Date of Birth: 1950-12-24   Medicare Important Message Given:  Yes     [name] 11/09/2021, 10:06 AM

## 2021-11-09 NOTE — Consult Note (Signed)
[facility] Nurse ostomy consult note First post op ostomy pouch change.  Daughter (caregiver) at bedside and observes as well. Patient is alert and talkative.  Stoma type/location: LLQ colostomy Stomal assessment/size: 1 1/2" pink and moist flush from 7 o'clock to 11 o'clock. blood tinged liquid only Peristomal assessment: intact   Treatment options for stomal/peristomal skin:  Barrier ring and 2 piece flat pouch Output blood tinged liquid only today.  Ostomy pouching: 2pc. 2 3/4" pouch with barrier ring  Education provided: We remove old pouch and cleanse skin.  Measure stoma and cut barrier to fit.  Rationale explained to measure each time for 6 weeks as size may change.  POuch is applied and rolled closed.  We discuss emptying when 1/3 full, cleaning lip of pouch with toilet tissue and twice weekly pouch changes.  Daughter will be assisting at home but would like [facility] care if possible for ongoing teaching. Extra supplies left above sink. Will see again Wednesday or Thursday.  Enrolled patient in [place] program: No Will follow.  Domenic Moras MSN, RN, FNP-BC CWON Wound, Ostomy, Continence Nurse Pager [phone]

## 2021-11-09 NOTE — Progress Notes (Signed)
  Transition of Care [facility]) Screening Note   Patient Details  Name: Chelsea [name] Date of Birth: 04/24/1951   Transition of Care [facility]) CM/SW Contact:    Dawayne Patricia, RN Phone Number: 11/09/2021, 12:17 PM    Transition of Care Department [facility]) has reviewed patient and no TOC needs have been identified at this time-continued post op medical needs and PT/OT evals pending.  We will continue to monitor patient advancement through interdisciplinary progression rounds. If new patient transition needs arise, please place a TOC consult.

## 2021-11-09 NOTE — NC FL2 (Signed)
[place] LEVEL OF CARE SCREENING TOOL     IDENTIFICATION  Patient Name: Chelsea [name] Birthdate: 16-Mar-1951 Sex: female Admission Date (Current Location): 11/04/2021  [facility] and [place] Number:  Herbalist and Address:  [facility] [place]. [facility], [place] [address], [place], [place] [address]      Provider Number: [id number]  Attending Physician Name and Address:  Lucious Groves, DO  Relative Name and Phone Number:  Linton Rump [phone]    Current Level of Care: Hospital Recommended Level of Care: [facility] Prior Approval Number:    Date Approved/Denied:   PASRR Number: [id number]  Discharge Plan: SNF    Current Diagnoses: Patient Active Problem List   Diagnosis Date Noted   Perforation of sigmoid colon due to diverticulitis 11/07/2021   Perforated diverticulum 11/04/2021   AKI (acute kidney injury) ([place]) 11/04/2021   Chronic back pain 11/04/2021   Chronic, continuous use of opioids 11/04/2021   Nutritional counseling 01/19/2018   Primary osteoarthritis of left knee 01/03/2016   Mild to Moderate Aortic Insufficiency    Hypertension    Hyperlipidemia    Morbid obesity ([place])    Pulmonary hypertension ([place]) 01/30/2013   Orthostatic hypotension 12/18/2012   Aortic valve disorder 07/29/2010   CHEST PAIN-PRECORDIAL 07/29/2010   CYST OF THYROID 01/06/2010   Hypokalemia 01/06/2010   HYPERLIPIDEMIA 12/24/2009   OBESITY 12/24/2009   HYPERTENSION, UNSPECIFIED 12/24/2009   Personal history of unspecified circulatory disease 12/24/2009   PEPTIC ULCER DISEASE, HX OF 12/24/2009    Orientation RESPIRATION BLADDER Height & Weight     Self, Time, Situation, Place (WDL)  Normal Continent, External catheter (External Urinary Catheter) Weight: 176 lb 5.9 oz (80 kg) Height:  5\' 6"  (167.6 cm)  BEHAVIORAL SYMPTOMS/MOOD NEUROLOGICAL BOWEL NUTRITION STATUS      Continent Diet (Please see discharge summary)  AMBULATORY STATUS  COMMUNICATION OF NEEDS Skin   Extensive Assist Verbally Other (Comment) (appropriate for ethnicity,dry,incision closed,abdomen,gauze,ABD,clean,dry,intact)                       Personal Care Assistance Level of Assistance  Bathing, Feeding, Dressing Bathing Assistance: Maximum assistance Feeding assistance: Independent (able to feed self) Dressing Assistance: Maximum assistance     Functional Limitations Info  Sight, Hearing, Speech Sight Info: Impaired (wears glasses) Hearing Info: Impaired (wears hearing aid in both ears) Speech Info: Adequate (WDL)    SPECIAL CARE FACTORS FREQUENCY  PT (By licensed PT), OT (By licensed OT)     PT Frequency: 5x min weekly OT Frequency: 5x min weekly            Contractures Contractures Info: Not present    Additional Factors Info  Code Status, Allergies Code Status Info: FULL Allergies Info: Other Hair dye, caused eye swelling, multiple times experienced this reaction,Hydrocodone,Oxycodone           Current Medications (11/09/2021):  This is the current hospital active medication list Current Facility-Administered Medications  Medication Dose Route Frequency Provider Last Rate Last Admin   acetaminophen (TYLENOL) tablet 1,000 mg  1,000 mg Oral Q6H Saverio Danker, PA-C   1,000 mg at 11/09/21 0944   aspirin EC tablet 81 mg  81 mg Oral Daily Georganna Skeans, MD   81 mg at 11/09/21 8469   Chlorhexidine Gluconate Cloth 2 % PADS 6 each  6 each Topical Daily Axel Filler, MD   6 each at 11/09/21 1000   cloNIDine (CATAPRES - Dosed in  mg/24 hr) patch 0.2 mg  0.2 mg Transdermal Weekly Georganna Skeans, MD   0.2 mg at 11/05/21 1631   dextrose 5 %-0.45 % sodium chloride infusion   Intravenous Continuous Virl Axe, MD 125 mL/hr at 11/09/21 1413 New Bag at 11/09/21 1413   enoxaparin (LOVENOX) injection 40 mg  40 mg Subcutaneous Q24H Georganna Skeans, MD   40 mg at 11/08/21 2201   hydrALAZINE (APRESOLINE) injection 10 mg  10 mg  Intravenous Q3H PRN Georganna Skeans, MD   10 mg at 11/09/21 1414   HYDROmorphone (DILAUDID) injection 0.5-1 mg  0.5-1 mg Intravenous Q2H PRN Georganna Skeans, MD   1 mg at 11/09/21 0543   labetalol (NORMODYNE) injection 10 mg  10 mg Intravenous Q6H Virl Axe, MD       methocarbamol (ROBAXIN) tablet 500 mg  500 mg Oral TID Saverio Danker, PA-C   500 mg at 11/09/21 0944   nicotine (NICODERM CQ - dosed in mg/24 hours) patch 21 mg  21 mg Transdermal Daily Georganna Skeans, MD   21 mg at 11/09/21 0810   ondansetron (ZOFRAN) tablet 4 mg  4 mg Oral Q6H PRN Georganna Skeans, MD       Or   ondansetron [facility]) injection 4 mg  4 mg Intravenous Q6H PRN Georganna Skeans, MD   4 mg at 11/07/21 1439   pantoprazole (PROTONIX) injection 40 mg  40 mg Intravenous Q24H Georganna Skeans, MD   40 mg at 11/08/21 2201   piperacillin-tazobactam (ZOSYN) IVPB 3.375 g  3.375 g Intravenous Barbee Cough, MD 12.5 mL/hr at 11/09/21 1114 3.375 g at 11/09/21 1114     Discharge Medications: Please see discharge summary for a list of discharge medications.  Relevant Imaging Results:  Relevant Lab Results:   Additional Information [id number], Both Covid Vaccines and 2 boosters  Milas Gain, LCSWA

## 2021-11-09 NOTE — Progress Notes (Cosign Needed)
Progress Note  1 Day Post-Op  Subjective: Some pain as expected post op.  No flatus in bag yet, but no nausea.  Feels a bit bloated.  400cc of urine overnight.    Objective: Vital signs in last 24 hours: Temp:  [97.3 F (36.3 C)-99.2 F (37.3 C)] 97.3 F (36.3 C) (12/12 0431) Pulse Rate:  [60-88] 73 (12/12 0431) Resp:  [12-24] 12 (12/12 0431) BP: (141-185)/(69-106) 141/79 (12/12 0431) SpO2:  [95 %-100 %] 95 % (12/12 0431) Weight:  [80 kg] 80 kg (12/12 0431) Last BM Date: 11/07/21  Intake/Output from previous day: 12/11 0701 - 12/12 0700 In: 3750.8 [I.V.:3246; IV Piggyback:504.8] Out: 1875 [Urine:1200; Drains:400; Blood:75] Intake/Output this shift: No intake/output data recorded.  PE: Abd: soft, tender to palpation as expected.  Midline wound is clean and packed.  LMQ colostomy with viable stoma, no output yet.  Absent BS GU: concentrated urine present in foley   Lab Results:  Recent Labs    11/08/21 0127 11/09/21 0215  WBC 7.9 15.4*  HGB 12.5 14.5  HCT 39.4 44.1  PLT 343 489*   BMET Recent Labs    11/08/21 1834 11/09/21 0215  NA 132* 133*  K 5.0 4.7  CL 99 98  CO2 24 25  GLUCOSE 194* 133*  BUN 8 11  CREATININE 0.93 1.23*  CALCIUM 8.4* 8.5*   PT/INR No results for input(s): LABPROT, INR in the last 72 hours. CMP     Component Value Date/Time   NA 133 (L) 11/09/2021 0215   NA 144 10/22/2020 1352   K 4.7 11/09/2021 0215   CL 98 11/09/2021 0215   CO2 25 11/09/2021 0215   GLUCOSE 133 (H) 11/09/2021 0215   BUN 11 11/09/2021 0215   BUN 16 10/22/2020 1352   CREATININE 1.23 (H) 11/09/2021 0215   CREATININE 1.11 (H) 04/20/2016 1409   CALCIUM 8.5 (L) 11/09/2021 0215   PROT 7.4 11/04/2021 1318   PROT 6.8 08/11/2020 1058   ALBUMIN 2.9 (L) 11/04/2021 1318   ALBUMIN 3.8 08/11/2020 1058   AST 14 (L) 11/04/2021 1318   ALT 11 11/04/2021 1318   ALKPHOS 93 11/04/2021 1318   BILITOT 0.5 11/04/2021 1318   BILITOT 0.4 08/11/2020 1058   GFRNONAA 48 (L)  11/09/2021 0215   GFRAA 42 (L) 10/22/2020 1352   Lipase     Component Value Date/Time   LIPASE 27 11/04/2021 1318       Studies/Results: No results found.  Anti-infectives: Anti-infectives (From admission, onward)    Start     Dose/Rate Route Frequency Ordered Stop   11/07/21 1400  neomycin (MYCIFRADIN) tablet 1,000 mg       See Hyperspace for full Linked Orders Report.   1,000 mg Oral 3 times per day 11/07/21 1111 11/07/21 2254   11/07/21 1400  metroNIDAZOLE (FLAGYL) tablet 1,000 mg       See Hyperspace for full Linked Orders Report.   1,000 mg Oral 3 times per day 11/07/21 1111 11/07/21 2100   11/07/21 1200  cefoTEtan (CEFOTAN) 2 g in sodium chloride 0.9 % 100 mL IVPB        2 g 200 mL/hr over 30 Minutes Intravenous On call to O.R. 11/07/21 1111 11/08/21 1139   11/05/21 0400  piperacillin-tazobactam (ZOSYN) IVPB 3.375 g        3.375 g 12.5 mL/hr over 240 Minutes Intravenous Every 8 hours 11/04/21 2018     11/04/21 2015  piperacillin-tazobactam (ZOSYN) IVPB 3.375 g  3.375 g 100 mL/hr over 30 Minutes Intravenous  Once 11/04/21 2009 11/04/21 2253        Assessment/Plan POD 1, s/p Hartmann's by Dr. Grandville Silos 11/08/21 for Perforated diverticulitis  - cont NPO x ice and await ileus resolution -ambulated in halls TID, PT consult -cont abx therapy for 5 days post op -multi-modal pain control -NS WD dressing changes to midline wound BID -WOC consult for new colostomy -patient appears quite dry with an elevated hgb and concentrated appearance of urine.  Increase fluids as least for 24 hrs to 125cc -cr also slightly up to 1.23.  IVFs should help this as well. 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

## 2021-11-09 NOTE — Evaluation (Signed)
Physical Therapy Evaluation Patient Details Name: Chelsea [name] MRN: [id number] DOB: [birth year] Today's Date: 11/09/2021  History of Present Illness  The pt is a 70 yo female presenting 12/7 with constipation, nausea, and vomiting. 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 PMH inlcudes: HTN, HLD, GERD, CKD, CVA with residual L sided weakness, and L knee.   Clinical Impression  Pt in bed upon arrival of PT, agreeable to evaluation at this time. Prior to admission the pt was mobilizing independently with use of quad cane in her home, was able to navigate a flight of stairs to her bed/bathroom daily, and assist with IADLs and home management. The pt now presents with limitations in functional mobility, strength, coordination, and activity tolerance due to above dx and resulting pain, and will continue to benefit from skilled PT to address these deficits. The pt required sequential cues to complete rolling in bed as well as maxA to physically complete all parts of the rolling motion. The pt was able to advance her RLE off the edge of the bed, but required assist with LLE due to residual weakness after her CVA. After achieving a sidelying position, the pt began resisting PT assist to transition to sitting EOB and then declined all further attempts at mobility this morning. The pt was positioned in the chair position in the bed with HOB at 50 deg, and further strength and ROM assessment was completed from this position.   Given current increase in assist needed for bed mobility and likely transfers as well, I currently recommend short stint SNF for continued rehab to facilitate return to prior level of function and independence. The pt lives with her daughter, but according to the pt her daughter has recently undergone knee surgery and is unable to physically assist. The pt feels other family members can be available  to provide physical assist, but is unsure if 2 people can be present to assist her at all times. Will continue to assess mobility and assist levels needed as tolerated by pt as she is hopeful to return home.         Recommendations for follow up therapy are one component of a multi-disciplinary discharge planning process, led by the attending physician.  Recommendations may be updated based on patient status, additional functional criteria and insurance authorization.  Follow Up Recommendations Skilled nursing-short term rehab (<3 hours/day)    Assistance Recommended at Discharge Frequent or constant Supervision/Assistance  Functional Status Assessment Patient has had a recent decline in their functional status and demonstrates the ability to make significant improvements in function in a reasonable and predictable amount of time.   Equipment Recommendations  Wheelchair (measurements PT);Wheelchair cushion (measurements PT);BSC/3in1;Hospital bed (if going home)    Recommendations for Other Services       Precautions / Restrictions Precautions Precautions: Other (comment) Precaution Comments: abdominal, colostomy Restrictions Weight Bearing Restrictions: No      Mobility  Bed Mobility Overal bed mobility: Needs Assistance Bed Mobility: Rolling;Sidelying to Sit Rolling: Max assist Sidelying to sit: Total assist       General bed mobility comments: pt attempting to assist by repositioning LUE and LEs when cued, but unable to generate any functional power to complete roll, then actively resisting PT attempts to complete sidelying to sit.    Transfers                   General transfer comment: deferred  as pt unable to achieve sitting EOB          Balance Overall balance assessment:  (unable to fully assess as pt unable to achieve sitting EOB, leaning to L with supported sitting in bed needing pillow placement to maintain neutral)                                            Pertinent Vitals/Pain Pain Assessment: Faces Faces Pain Scale: Hurts whole lot Pain Location: abdomen, incision Pain Descriptors / Indicators: Discomfort;Grimacing;Sore Pain Intervention(s): Monitored during session;Limited activity within patient's tolerance;Repositioned    Home Living Family/patient expects to be discharged to:: Private residence Living Arrangements: Children Available Help at Discharge: Family;Available 24 hours/day Type of Home: House Home Access: Stairs to enter   Entrance Stairs-Number of Steps: 1 Alternate Level Stairs-Number of Steps: flight Home Layout: Multi-level;Bed/bath upstairs Home Equipment: Rollator (4 wheels);Cane - quad;Shower seat;Toilet riser      Prior Function Prior Level of Function : Independent/Modified Independent             Mobility Comments: pt uses quad cane or rollator, able to complete stairs without assist ADLs Comments: pt does some cleaning, her daughter does the cooking, reports independence with ADLs     Hand Dominance   Dominant Hand: Right    Extremity/Trunk Assessment   Upper Extremity Assessment Upper Extremity Assessment: LUE deficits/detail LUE Deficits / Details: pt with residual deficits from CVA, impaired coordination but grossly able to generate 3+/5 to MMT when positioned. tremors with effort. LUE Sensation: decreased light touch LUE Coordination: decreased fine motor;decreased gross motor    Lower Extremity Assessment Lower Extremity Assessment: LLE deficits/detail LLE Deficits / Details: pt able to demo slight hip flexion against gravity, grossly 3+/5 at ankle for MMT, reports she has AFO at home (not with her). LLE Sensation: decreased light touch LLE Coordination: decreased fine motor;decreased gross motor    Cervical / Trunk Assessment Cervical / Trunk Assessment: Kyphotic;Other exceptions Cervical / Trunk Exceptions: L lean  Communication   Communication: No  difficulties  Cognition Arousal/Alertness: Awake/alert Behavior During Therapy: Flat affect Overall Cognitive Status: No family/caregiver present to determine baseline cognitive functioning                                 General Comments: pt able to answer questions about PLOF, needing cues for sequencing of all tasks. asking to defer mobility, poor insight to role of mobility in returning home and significant goals she must reach (climbing a flight of stairs)        General Comments General comments (skin integrity, edema, etc.): BP elevated to 174/84 (111) after session. Pt left in chair position in bed with HOB at 50 deg    Exercises     Assessment/Plan    PT Assessment Patient needs continued PT services  PT Problem List Decreased strength;Decreased range of motion;Decreased activity tolerance;Decreased balance;Decreased mobility;Decreased coordination;Pain;Impaired sensation       PT Treatment Interventions DME instruction;Gait training;Stair training;Therapeutic activities;Functional mobility training;Therapeutic exercise;Balance training;Patient/family education    PT Goals (Current goals can be found in the Care Plan section)  Acute Rehab PT Goals Patient Stated Goal: to return home PT Goal Formulation: With patient Time For Goal Achievement: 11/23/21 Potential to Achieve Goals: Good    Frequency Min 3X/week  Barriers to discharge Inaccessible home environment pt with flight of stairs to reach bedroom       AM-PAC PT "6 Clicks" Mobility  Outcome Measure Help needed turning from your back to your side while in a flat bed without using bedrails?: Total Help needed moving from lying on your back to sitting on the side of a flat bed without using bedrails?: Total Help needed moving to and from a bed to a chair (including a wheelchair)?: Total Help needed standing up from a chair using your arms (e.g., wheelchair or bedside chair)?: Total Help needed to  walk in hospital room?: Total Help needed climbing 3-5 steps with a railing? : Total 6 Click Score: 6    End of Session   Activity Tolerance: Patient limited by pain Patient left: in bed;with call bell/phone within reach;with bed alarm set (HOB at 50 deg) Nurse Communication: Mobility status PT Visit Diagnosis: Other abnormalities of gait and mobility (R26.89);Muscle weakness (generalized) (M62.81);Hemiplegia and hemiparesis;Pain Hemiplegia - Right/Left: Left Hemiplegia - dominant/non-dominant: Non-dominant Hemiplegia - caused by:  (chronic) Pain - part of body:  (abdomen, incisional)    Time: 1206-1229 PT Time Calculation (min) (ACUTE ONLY): 23 min   Charges:   PT Evaluation $PT Eval Low Complexity: 1 Low PT Treatments $Therapeutic Activity: 8-22 mins        West Carbo, PT, DPT   Acute Rehabilitation Department Pager #: [phone]  Sandra Cockayne 11/09/2021, 12:52 PM

## 2021-11-10 ENCOUNTER — Other Ambulatory Visit: Payer: Self-pay

## 2021-11-10 LAB — BASIC METABOLIC PANEL
Anion gap: 10 (ref 5–15)
BUN: 12 mg/dL (ref 8–23)
CO2: 24 mmol/L (ref 22–32)
Calcium: 8.1 mg/dL — ABNORMAL LOW (ref 8.9–10.3)
Chloride: 97 mmol/L — ABNORMAL LOW (ref 98–111)
Creatinine, Ser: 1.05 mg/dL — ABNORMAL HIGH (ref 0.44–1.00)
GFR, Estimated: 58 mL/min — ABNORMAL LOW (ref >60)
Glucose, Bld: 123 mg/dL — ABNORMAL HIGH (ref 70–99)
Potassium: 3.8 mmol/L (ref 3.5–5.1)
Sodium: 131 mmol/L — ABNORMAL LOW (ref 135–145)

## 2021-11-10 MED ORDER — TRAMADOL HCL 50 MG PO TABS
50.0000 mg | ORAL_TABLET | Freq: Four times a day (QID) | ORAL | Status: DC | PRN
  Administered 2021-11-10 – 2021-11-11 (×3): 100 mg via ORAL
  Filled 2021-11-10 (×3): qty 2

## 2021-11-10 MED ORDER — METHOCARBAMOL 500 MG PO TABS
750.0000 mg | ORAL_TABLET | Freq: Three times a day (TID) | ORAL | Status: DC
  Administered 2021-11-10 – 2021-11-13 (×11): 750 mg via ORAL
  Filled 2021-11-10 (×11): qty 2

## 2021-11-10 MED ORDER — IRBESARTAN 300 MG PO TABS
300.0000 mg | ORAL_TABLET | Freq: Every day | ORAL | Status: DC
  Administered 2021-11-10 – 2021-11-13 (×4): 300 mg via ORAL
  Filled 2021-11-10 (×4): qty 1

## 2021-11-10 MED ORDER — SPIRONOLACTONE 25 MG PO TABS
25.0000 mg | ORAL_TABLET | Freq: Every day | ORAL | Status: DC
  Administered 2021-11-10 – 2021-11-13 (×4): 25 mg via ORAL
  Filled 2021-11-10 (×4): qty 1

## 2021-11-10 MED ORDER — AMLODIPINE BESYLATE 10 MG PO TABS
10.0000 mg | ORAL_TABLET | Freq: Every day | ORAL | Status: DC
  Administered 2021-11-10 – 2021-11-13 (×4): 10 mg via ORAL
  Filled 2021-11-10 (×4): qty 1

## 2021-11-10 MED ORDER — AMLODIPINE BESYLATE-VALSARTAN 10-320 MG PO TABS: 1.0000 | ORAL_TABLET | Freq: Every day | ORAL | Status: DC

## 2021-11-10 MED ORDER — HYDROMORPHONE HCL 1 MG/ML IJ SOLN
0.5000 mg | INTRAMUSCULAR | Status: DC | PRN
Start: 2021-11-10 — End: 2021-11-13
  Administered 2021-11-10: 0.5 mg via INTRAVENOUS
  Administered 2021-11-11: 01:00:00 1 mg via INTRAVENOUS
  Filled 2021-11-10: qty 1

## 2021-11-10 MED ORDER — METOPROLOL SUCCINATE ER 100 MG PO TB24
100.0000 mg | ORAL_TABLET | Freq: Every day | ORAL | Status: DC
  Administered 2021-11-10 – 2021-11-12 (×3): 100 mg via ORAL
  Filled 2021-11-10 (×3): qty 1

## 2021-11-10 NOTE — Evaluation (Signed)
Occupational Therapy Evaluation Patient Details Name: Chelsea [name] MRN: [id number] DOB: 06-Oct-1951 Today's Date: 11/10/2021   History of Present Illness The pt is a 70 yo female presenting 12/7 with constipation, nausea, and vomiting. 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 PMH inlcudes: HTN, HLD, GERD, CKD, CVA with residual L sided weakness, and L knee.   Clinical Impression   PTA, pt lives with family and reports Modified Independence with mobility using cane vs Rollator. Pt reports Modified Independence with grooming, toileting and UB ADLs but receives assistance with LB dressing/bathing from [facility] aide 4 days/week. Pt presents now with improving pain levels and able to progress to hallway mobility using RW in collaboration with PT. Pt overall min guard for BSC transfers and short distance mobility. Pt requires Min A for UB ADLs and Max A for LB ADLs (including toileting hygiene today). Began education on compensatory strategies for ADLs/general body mechanics to ease prevent pain with further reinforcement needed. Pt declines SNF rehab currently and reports family can assist her 24/7 at discharge. If family unable to provide the physical assist currently needed, will need SNF rehab to maximize safety at home.     Recommendations for follow up therapy are one component of a multi-disciplinary discharge planning process, led by the attending physician.  Recommendations may be updated based on patient status, additional functional criteria and insurance authorization.   Follow Up Recommendations  Home health OT    Assistance Recommended at Discharge Frequent or constant Supervision/Assistance  Functional Status Assessment  Patient has had a recent decline in their functional status and demonstrates the ability to make significant improvements in function in a reasonable and predictable amount of time.   Equipment Recommendations  None recommended by OT    Recommendations for Other Services       Precautions / Restrictions Precautions Precautions: Other (comment) Precaution Comments: abdominal, colostomy, JP drain Restrictions Weight Bearing Restrictions: No      Mobility Bed Mobility Overal bed mobility: Needs Assistance Bed Mobility: Supine to Sit     Supine to sit: Mod assist;HOB elevated     General bed mobility comments: Handheld assist to lift trunk, HOB elevated and use of bedrail    Transfers Overall transfer level: Needs assistance Equipment used: Rolling walker (2 wheels) Transfers: Sit to/from Stand Sit to Stand: Min guard           General transfer comment: cues for hand placement      Balance Overall balance assessment: Needs assistance Sitting-balance support: No upper extremity supported;Feet supported Sitting balance-Leahy Scale: Fair     Standing balance support: Bilateral upper extremity supported;Single extremity supported;During functional activity Standing balance-Leahy Scale: Poor Standing balance comment: reliant on at least one UE support in standing, shakiness and posterior bias when standing unsupported                           ADL either performed or assessed with clinical judgement   ADL Overall ADL's : Needs assistance/impaired Eating/Feeding: Independent;Sitting   Grooming: Min guard;Standing   Upper Body Bathing: Minimal assistance;Sitting   Lower Body Bathing: Sit to/from stand;Maximal assistance   Upper Body Dressing : Minimal assistance;Sitting   Lower Body Dressing: Maximal assistance;Sit to/from stand   Toilet Transfer: Min Patent examiner Details (indicate cue type and reason): cues for hand placement Toileting- Clothing Manipulation and Hygiene: Sit to/from stand;Maximal assistance Toileting -  Clothing Manipulation Details (indicate cue type and reason): assist for  clothing mgmt, toileting hygiene (initially reported assist with this at home but clarified assistance only needed since sx, reports too sore to reach)     Functional mobility during ADLs: Min guard;Rolling walker (2 wheels);Cueing for sequencing;Cueing for safety General ADL Comments: improving pain levels since sx, able to progress hallway mobility in collab with PT though requiring increased assist for ADLs due to abdominal soreness in reaching/bending     Vision Baseline Vision/History: 1 Wears glasses Ability to See in Adequate Light: 0 Adequate Patient Visual Report: No change from baseline Vision Assessment?: No apparent visual deficits     Perception     Praxis      Pertinent Vitals/Pain Pain Assessment: Faces Faces Pain Scale: Hurts a little bit Pain Location: abdomen, incision Pain Descriptors / Indicators: Sore Pain Intervention(s): Monitored during session     Hand Dominance Right   Extremity/Trunk Assessment Upper Extremity Assessment Upper Extremity Assessment: Generalized weakness;LUE deficits/detail LUE Deficits / Details: pt with residual deficits from CVA, impaired coordination but grossly able to generate 3+/5 to MMT when positioned. tremors with effort. LUE Sensation: decreased light touch LUE Coordination: decreased fine motor;decreased gross motor   Lower Extremity Assessment Lower Extremity Assessment: Defer to PT evaluation   Cervical / Trunk Assessment Cervical / Trunk Assessment: Kyphotic   Communication Communication Communication: No difficulties   Cognition Arousal/Alertness: Awake/alert Behavior During Therapy: Flat affect Overall Cognitive Status: No family/caregiver present to determine baseline cognitive functioning                                 General Comments: Pt follows directions, gives appropriate PLOF answers though some increased time and clarifying questions needed at times. benefits from intermittent safety  cues     General Comments  BP 198/105 after mobility, per pt and RN, had not received BP medications. HR 102 at highest    Exercises     Shoulder Instructions      Home Living Family/patient expects to be discharged to:: Private residence Living Arrangements: Children;Other (Comment) Available Help at Discharge: Family;Available 24 hours/day Type of Home: House Home Access: Stairs to enter [employer] of Steps: 1   Home Layout: Multi-level;Bed/bath upstairs Alternate Level Stairs-Number of Steps: flight Alternate Level Stairs-Rails: Left Bathroom Shower/Tub: Teacher, early years/pre: Standard     Home Equipment: Rollator (4 wheels);Cane - quad;Shower seat;Toilet riser;Rolling Walker (2 wheels)   Additional Comments: daughter with knee surgery in Oct, is not using DME currently per pt and reports her daughter can assist at home. Daughter is not working, but daughter's boyfriend and pt's son also at home (though they work during the day)      Prior Functioning/Environment Prior Level of Function : Needs assist       Physical Assist : ADLs (physical)   ADLs (physical): Bathing;Dressing;IADLs Mobility Comments: pt uses quad cane or rollator, able to complete stairs without assist. Denies falls but reports L LE shakiness at baseline due to CVA ADLs Comments: pt does some cleaning, her daughter does the cooking, reports having an aide that assists Tues-Fri for 2.5 hours for bathing (showering)/dressing. Daughter assists as needed for LB ADLs (sets up pt for sponge bathing task) on the other days when aide not present.        OT Problem List: Decreased strength;Decreased activity tolerance;Impaired balance (sitting and/or standing);Decreased knowledge of use of  DME or AE;Decreased knowledge of precautions;Pain      OT Treatment/Interventions: Self-care/ADL training;Therapeutic exercise;Energy conservation;DME and/or AE instruction;Therapeutic  activities;Patient/family education;Balance training    OT Goals(Current goals can be found in the care plan section) Acute Rehab OT Goals Patient Stated Goal: go home OT Goal Formulation: With patient Time For Goal Achievement: 11/24/21 Potential to Achieve Goals: Good ADL Goals Pt Will Perform Grooming: with modified independence;standing Pt Will Transfer to Toilet: with modified independence;ambulating Pt Will Perform Toileting - Clothing Manipulation and hygiene: with modified independence;sit to/from stand;sitting/lateral leans Additional ADL Goal #1: Pt to increase activity tolerance > 10 min during functional tasks to improve overall endurance  OT Frequency: Min 2X/week   Barriers to D/C:            Co-evaluation PT/OT/SLP Co-Evaluation/Treatment: Yes Reason for Co-Treatment: Other (comment) (to progress OOB activity due to pain limited and +2 need the day prior)   OT goals addressed during session: ADL's and self-care      AM-PAC OT "6 Clicks" Daily Activity     Outcome Measure Help from another person eating meals?: None Help from another person taking care of personal grooming?: A Little Help from another person toileting, which includes using toliet, bedpan, or urinal?: A Lot Help from another person bathing (including washing, rinsing, drying)?: A Lot Help from another person to put on and taking off regular upper body clothing?: A Little Help from another person to put on and taking off regular lower body clothing?: A Lot 6 Click Score: 16   End of Session Equipment Utilized During Treatment: Gait belt;Rolling walker (2 wheels) Nurse Communication: Mobility status  Activity Tolerance: Patient tolerated treatment well Patient left: in chair;with call bell/phone within reach;with chair alarm set  OT Visit Diagnosis: Unsteadiness on feet (R26.81);Other abnormalities of gait and mobility (R26.89);Muscle weakness (generalized) (M62.81);Pain Pain - part of body:   (abdomen)                Time: 7124-5809 OT Time Calculation (min): 31 min Charges:  OT General Charges $OT Visit: 1 Visit OT Evaluation $OT Eval Moderate Complexity: 1 Mod  Malachy Chamber, OTR/L Acute Rehab Services Office: [phone]   Layla Maw 11/10/2021, 11:50 AM

## 2021-11-10 NOTE — Progress Notes (Signed)
Physical Therapy Treatment Patient Details Name: Chelsea [name] MRN: [id number] DOB: January 02, 1951 Today's Date: 11/10/2021   History of Present Illness The pt is a 70 yo female presenting 12/7 with constipation, nausea, and vomiting. 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 PMH inlcudes: HTN, HLD, GERD, CKD, CVA with residual L sided weakness.    PT Comments    Pt progressing well with mobility. Able to progress ambulation into hall today with a chair follow. Pt limited by generalized weakness, impaired balance, decreased activity tolerance, and decreased functional mobility. Pt declining SNF and wants to go home; reports family can provide 24/7 assist. Recommending HHPT after discharge to maximize mobility and independence. Will continue to follow acutely to address established goals. Plan to practice stairs next visit.     Recommendations for follow up therapy are one component of a multi-disciplinary discharge planning process, led by the attending physician.  Recommendations may be updated based on patient status, additional functional criteria and insurance authorization.  Follow Up Recommendations  Home health PT     Assistance Recommended at Discharge Frequent or constant Supervision/Assistance  Equipment Recommendations  BSC/3in1;Wheelchair (measurements PT);Wheelchair cushion (measurements PT) (TBD)   Recommendations for Other Services       Precautions / Restrictions Precautions Precautions: Other (comment) Precaution Comments: abdominal, colostomy, JP drain Restrictions Weight Bearing Restrictions: No     Mobility  Bed Mobility Overal bed mobility: Needs Assistance Bed Mobility: Supine to Sit     Supine to sit: Mod assist;HOB elevated     General bed mobility comments: Handheld assist to lift trunk, HOB elevated and use of bedrail    Transfers Overall transfer level:  Needs assistance Equipment used: Rolling walker (2 wheels) Transfers: Sit to/from Stand;Bed to chair/wheelchair/BSC Sit to Stand: Min guard Stand pivot transfers: Min guard         General transfer comment: cues for hand placement, min guard for safety    Ambulation/Gait Ambulation/Gait assistance: Min guard;+2 safety/equipment Gait Distance (Feet): 38 Feet (+18) Assistive device: Rolling walker (2 wheels) Gait Pattern/deviations: Decreased weight shift to left;Step-through pattern;Decreased stride length Gait velocity: decreased     General Gait Details: Ambulated with chair follow; 1 seated rest break due to fatigue. L leg shakey in standing and walking, pt reports this how it has been since her stroke. Min guard for safety. Cues to say inside RW. Discussed using RW instead of rollator for added stability   Stairs             Wheelchair Mobility    Modified Rankin (Stroke Patients Only)       Balance Overall balance assessment: Needs assistance Sitting-balance support: No upper extremity supported;Feet supported Sitting balance-Leahy Scale: Fair     Standing balance support: Bilateral upper extremity supported;Single extremity supported;During functional activity Standing balance-Leahy Scale: Poor Standing balance comment: reliant on at least one UE support in standing, shakiness and posterior bias when standing unsupported                            Cognition Arousal/Alertness: Awake/alert Behavior During Therapy: Flat affect Overall Cognitive Status: No family/caregiver present to determine baseline cognitive functioning Area of Impairment: Safety/judgement;Problem solving                         Safety/Judgement: Decreased awareness of safety   Problem Solving: Slow processing  General Comments: Pt follows directions, gives appropriate PLOF answers though some increased time and clarifying questions needed at times. benefits from  intermittent safety cues        Exercises      General Comments General comments (skin integrity, edema, etc.): BP 198/105 after mobility, per pt and RN, had not received BP medications. HR up to 102 with ambulation. Confirmed that pt declined SNF and wants to go home. Increased time discussing assist and equipment needed. Pt reports daughter can provide necessary assist.      Pertinent Vitals/Pain Pain Assessment: Faces Faces Pain Scale: Hurts a little bit Pain Location: abdomen, incision Pain Descriptors / Indicators: Sore Pain Intervention(s): Monitored during session    Home Living Family/patient expects to be discharged to:: Private residence Living Arrangements: Children;Other (Comment) Available Help at Discharge: Family;Available 24 hours/day Type of Home: House Home Access: Stairs to enter   Entrance Stairs-Number of Steps: 1 Alternate Level Stairs-Number of Steps: flight Home Layout: Multi-level;Bed/bath upstairs Home Equipment: Rollator (4 wheels);Cane - quad;Shower seat;Toilet riser;Rolling Walker (2 wheels) Additional Comments: daughter with knee surgery in Oct, is not using DME currently per pt and reports her daughter can assist at home. Daughter is not working, but daughter's boyfriend and pt's son also at home (though they work during the day)    Prior Function            PT Goals (current goals can now be found in the care plan section) Acute Rehab PT Goals Patient Stated Goal: to return home PT Goal Formulation: With patient Time For Goal Achievement: 11/23/21 Potential to Achieve Goals: Good Progress towards PT goals: Progressing toward goals    Frequency    Min 3X/week      PT Plan      Co-evaluation   Reason for Co-Treatment: Other (comment) (to progress OOB mobility and needed +2 assist the day prior)   OT goals addressed during session: ADL's and self-care      AM-PAC PT "6 Clicks" Mobility   Outcome Measure  Help needed turning  from your back to your side while in a flat bed without using bedrails?: A Little Help needed moving from lying on your back to sitting on the side of a flat bed without using bedrails?: A Lot Help needed moving to and from a bed to a chair (including a wheelchair)?: A Little Help needed standing up from a chair using your arms (e.g., wheelchair or bedside chair)?: A Little Help needed to walk in hospital room?: Total Help needed climbing 3-5 steps with a railing? : Total 6 Click Score: 13    End of Session Equipment Utilized During Treatment: Gait belt Activity Tolerance: Patient tolerated treatment well Patient left: in chair;with chair alarm set;with call bell/phone within reach Nurse Communication: Mobility status;Other (comment) (elevated BP) PT Visit Diagnosis: Other abnormalities of gait and mobility (R26.89);Muscle weakness (generalized) (M62.81);Hemiplegia and hemiparesis;Pain Hemiplegia - Right/Left: Left Hemiplegia - dominant/non-dominant: Non-dominant     Time: 1856-3149 PT Time Calculation (min) (ACUTE ONLY): 31 min  Charges:  $Gait Training: 8-22 mins                     Brandon Melnick, SPT    Brandon Melnick 11/10/2021, 12:44 PM

## 2021-11-10 NOTE — Progress Notes (Signed)
Progress Note  2 Days Post-Op  Subjective: Some pain as expected post op.  No flatus in bag yet, but no nausea.  States she doesn't feel nauseated.  Midline wound dressing doesn't appear to have been changed again yesterday.  JP drain leaking on bed and clothes.   Objective: Vital signs in last 24 hours: Temp:  [97.3 F (36.3 C)-99 F (37.2 C)] 98.4 F (36.9 C) (12/13 0700) Pulse Rate:  [70-94] 87 (12/13 0700) Resp:  [10-20] 19 (12/13 0700) BP: (133-203)/(75-98) 149/84 (12/13 0700) SpO2:  [95 %-98 %] 97 % (12/13 0700) Weight:  [86.6 kg] 86.6 kg (12/13 0300) Last BM Date: 11/06/21  Intake/Output from previous day: 12/12 0701 - 12/13 0700 In: 210 [P.O.:60; IV Piggyback:150] Out: 725 [Urine:600; Drains:125] Intake/Output this shift: Total I/O In: -  Out: 100 [Drains:100]  PE: Abd: soft, tender to palpation as expected.  Midline wound is clean and packed.  LMQ colostomy with viable stoma, no output yet.  Decrease BS    Lab Results:  Recent Labs    11/08/21 0127 11/09/21 0215  WBC 7.9 15.4*  HGB 12.5 14.5  HCT 39.4 44.1  PLT 343 489*   BMET Recent Labs    11/09/21 0215 11/10/21 0200  NA 133* 131*  K 4.7 3.8  CL 98 97*  CO2 25 24  GLUCOSE 133* 123*  BUN 11 12  CREATININE 1.23* 1.05*  CALCIUM 8.5* 8.1*   PT/INR No results for input(s): LABPROT, INR in the last 72 hours. CMP     Component Value Date/Time   NA 131 (L) 11/10/2021 0200   NA 144 10/22/2020 1352   K 3.8 11/10/2021 0200   CL 97 (L) 11/10/2021 0200   CO2 24 11/10/2021 0200   GLUCOSE 123 (H) 11/10/2021 0200   BUN 12 11/10/2021 0200   BUN 16 10/22/2020 1352   CREATININE 1.05 (H) 11/10/2021 0200   CREATININE 1.11 (H) 04/20/2016 1409   CALCIUM 8.1 (L) 11/10/2021 0200   PROT 7.4 11/04/2021 1318   PROT 6.8 08/11/2020 1058   ALBUMIN 2.9 (L) 11/04/2021 1318   ALBUMIN 3.8 08/11/2020 1058   AST 14 (L) 11/04/2021 1318   ALT 11 11/04/2021 1318   ALKPHOS 93 11/04/2021 1318   BILITOT 0.5  11/04/2021 1318   BILITOT 0.4 08/11/2020 1058   GFRNONAA 58 (L) 11/10/2021 0200   GFRAA 42 (L) 10/22/2020 1352   Lipase     Component Value Date/Time   LIPASE 27 11/04/2021 1318       Studies/Results: No results found.  Anti-infectives: Anti-infectives (From admission, onward)    Start     Dose/Rate Route Frequency Ordered Stop   11/07/21 1400  neomycin (MYCIFRADIN) tablet 1,000 mg       See Hyperspace for full Linked Orders Report.   1,000 mg Oral 3 times per day 11/07/21 1111 11/07/21 2254   11/07/21 1400  metroNIDAZOLE (FLAGYL) tablet 1,000 mg       See Hyperspace for full Linked Orders Report.   1,000 mg Oral 3 times per day 11/07/21 1111 11/07/21 2100   11/07/21 1200  cefoTEtan (CEFOTAN) 2 g in sodium chloride 0.9 % 100 mL IVPB        2 g 200 mL/hr over 30 Minutes Intravenous On call to O.R. 11/07/21 1111 11/08/21 1139   11/05/21 0400  piperacillin-tazobactam (ZOSYN) IVPB 3.375 g        3.375 g 12.5 mL/hr over 240 Minutes Intravenous Every 8 hours 11/04/21 2018  11/04/21 2015  piperacillin-tazobactam (ZOSYN) IVPB 3.375 g        3.375 g 100 mL/hr over 30 Minutes Intravenous  Once 11/04/21 2009 11/04/21 2253        Assessment/Plan POD 2, s/p Hartmann's by Dr. Grandville Silos 11/08/21 for Perforated diverticulitis  - try CLD today given on nausea, if unable to tolerate may consider TNA tomorrow -ambulated in halls TID, PT consult -cont abx therapy for 5 days post op -multi-modal pain control -NS WD dressing changes to midline wound BID -WOC consult for new colostomy -cr improved to 1.05, only 600cc or urine documented yesterday.  Suspect she is still dry, can likely leave IVFs at 125cc/hr again today, especially while not taking in much orally.  FEN: CLD IVF @ 125 cc/h VTE: LMWH ID: Zosyn 12/7>>12/16  HTN HLD GERD CKD stage II Hx of CVA with L sided weakness OAB Chronic back pain Aortic aneurysm 4.0 cm  LOS: 6 days    Henreitta Cea, [facility] 11/10/2021, 8:16 AM Please see Amion for pager number during day hours 7:00am-4:30pm

## 2021-11-10 NOTE — TOC Progression Note (Addendum)
Transition of Care [facility]) - Progression Note    Patient Details  Name: Chelsea [name] MRN: [id number] Date of Birth: [birth year]  Transition of Care [facility]) CM/SW [place], [place] Phone Number: 11/10/2021, 10:00 AM  Clinical Narrative:     CSW informed patient now declines SNF placement. CSW met with patient at bedside. Patient adamant that she no longer wants SNF. She states her daughter will be in the home to assist her. Patient reports she has an aide  T/W/TH/F form 8-11 am. Patient states her preferred [facility] agency is [place]. Medicare.gov listing provided for other options if unable to get [facility].  Thurmond Butts, MSW, LCSW Clinical Social Worker     Expected Discharge Plan: Skilled Nursing Facility Barriers to Discharge: Continued Medical Work up  Expected Discharge Plan and Services Expected Discharge Plan: [place] In-house Referral: Clinical Social Work     Living arrangements for the past 2 months: Single Family Home                                       Social Determinants of Health (SDOH) Interventions    Readmission Risk Interventions No flowsheet data found.

## 2021-11-10 NOTE — Progress Notes (Signed)
Subjective:  No acute overnight events. Pt was seen resting in bed after eating breakfast. Pt reports that she has some abdominal soreness diffusely. She was able to tolerate her breakfast this morning without any nausea or vomiting. She mentions that her colostomy bag had some output yesterday. She denies any new onset HA, fevers or chills. No other complaints at this time.     Objective:  Vital signs in last 24 hours: Vitals:   11/09/21 2327 11/10/21 0300 11/10/21 0700 11/10/21 1135  BP: (!) 190/98 (!) 166/82 (!) 149/84 (!) 199/95  Pulse: 77 76 87 81  Resp: 20 15 19 19   Temp: 98 F (36.7 C) 99 F (37.2 C) 98.4 F (36.9 C) 98 F (36.7 C)  TempSrc: Oral Oral Oral Oral  SpO2: 98% 97% 97% 98%  Weight:  86.6 kg    Height:       Physical Exam: General: well-appearing female, lying in bed, NAD. CV: normal rate and regular rhythm, no m/r/g. Pulm: normal work of breathing, no respiratory distress noted. Abdomen: Distended abdomen with TTP around incision site. JP drain with serosanguinous drainage noted. Normoactive bowel sounds present. Midline wound is clean and dry. Colostomy with viable stoma, no output noted.  Neuro: AAOx3, no focal deficits noted. Skin: warm and dry.  Assessment/Plan:  Principal Problem:   Perforation of sigmoid colon due to diverticulitis Active Problems:   Hypokalemia   Pulmonary hypertension (HCC)   Hypertension   Perforated diverticulum   AKI (acute kidney injury) (HCC)   Chronic back pain   Chronic, continuous use of opioids  Ms. Donzetta Matters. Leverett is a 70 yo F with a history of HTN, HLD, GERD,  thoracic aortic aneurysm, mild aortic regurgitation, chronic pain on opioids, CKD, prior CVA and obesity presenting with abdominal pain and admitted for perforated diverticulitis, now on post-op day 2 s/p Hartmann's procedure.  Perforated diverticulitis s/p Hartmann's Procedure (PO Day 2) Post-op ileus-resolving Pt is able to tolerate CLD per surgery's  recommendations today. She notes that she did have some stool output in her colostomy bag yesterday (125 ml per chart review). Normoactive bowel sounds are present on evaluation after she had her breakfast this morning. Pain is well controlled with dilaudid, but this may in turn delay return of bowel function. Preliminary anaerobic cultures showing few GPC in clusters, few GPR, and few GNR. Currently on IV zosyn. -Await cultures and sensitivities. -appreciate surgery recommendations -Clear liquid diet per surgery -f/u pathology results -continue IV zosyn for 5 days post-op (day 2/5) -IV dilaudid for pain -trend CBC and BMP -ambulate as tolerated -PT/OT eval -WOC consulted for colostomy care and midline wound care  Mild Hyponatremia This is likely 2/2 decreased PO intake. She was also on isotonic saline infusions. Pt is on clear liquid diets and is tolerating well. -Discontinue isotonic saline  -Encourage oral intake of CLD -Trend BMP  Acute Kidney Injury-resolving Serum creatinine decreasing from 1.23 yesterday to 1.05 today with increased maintenance fluids to 125cc/hr. Likely pre-renal 2/2 decreased PO intake and recent surgery.  -trend kidney function -avoid nephrotoxic medications -strict I/O's  Resistant Hypertension Aortic aneurysm 4 cm  Pt's SBPs were from 150-200s overnight and this morning. Patient has a history of resistant hypertension and currently on 6 agent medication regimen at home. Chest CT obtained March 2021 confirmed thoracic aortic aneurysm measuring 4 cm in diameter. Since patient is able to tolerate PO intake, plan is to transition to home PO antihypertensives. -Start amlodipine 10 mg, irbesartan 300  mg, aldactone 25 mg and metoprolol succinate 100 mg  -Continue 0.2 mg clonidine patch weekly -continue hydralazine 10 mg Q3H PRN to maintain SBP<180 -discontinue labetalol   Atrial Tachycardia - resolved EKG reveals atrial tachycardia with LVH likely secondary to  sepsis possibly due to perforated diverticulitis. Seems to have resolved after starting IV labetalol. HR in 70s-80s today.    Best Practice: Diet: CLD IVF: no maintenance fluids given today VTE: Enoxaparin Code: Full  Signature: Claudean Kinds, MS-IV [facility]  Pager: [phone] 11:36 AM, 11/10/2021

## 2021-11-11 LAB — BASIC METABOLIC PANEL
Anion gap: 7 (ref 5–15)
Anion gap: 7 (ref 5–15)
BUN: 7 mg/dL — ABNORMAL LOW (ref 8–23)
BUN: 6 mg/dL — ABNORMAL LOW (ref 8–23)
CO2: 26 mmol/L (ref 22–32)
CO2: 24 mmol/L (ref 22–32)
Calcium: 8.1 mg/dL — ABNORMAL LOW (ref 8.9–10.3)
Calcium: 8.3 mg/dL — ABNORMAL LOW (ref 8.9–10.3)
Chloride: 98 mmol/L (ref 98–111)
Chloride: 100 mmol/L (ref 98–111)
Creatinine, Ser: 0.7 mg/dL (ref 0.44–1.00)
Creatinine, Ser: 0.83 mg/dL (ref 0.44–1.00)
GFR, Estimated: >60 mL/min (ref >60)
GFR, Estimated: >60 mL/min (ref >60)
Glucose, Bld: 106 mg/dL — ABNORMAL HIGH (ref 70–99)
Glucose, Bld: 139 mg/dL — ABNORMAL HIGH (ref 70–99)
Potassium: 3 mmol/L — ABNORMAL LOW (ref 3.5–5.1)
Potassium: 3.9 mmol/L (ref 3.5–5.1)
Sodium: 131 mmol/L — ABNORMAL LOW (ref 135–145)
Sodium: 131 mmol/L — ABNORMAL LOW (ref 135–145)

## 2021-11-11 LAB — CBC
HCT: 38.9 % (ref 36.0–46.0)
Hemoglobin: 12.3 g/dL (ref 12.0–15.0)
MCH: 28.4 pg (ref 26.0–34.0)
MCHC: 31.6 g/dL (ref 30.0–36.0)
MCV: 89.8 fL (ref 80.0–100.0)
Platelets: 480 10*3/uL — ABNORMAL HIGH (ref 150–400)
RBC: 4.33 MIL/uL (ref 3.87–5.11)
RDW: 14.1 % (ref 11.5–15.5)
WBC: 12.2 10*3/uL — ABNORMAL HIGH (ref 4.0–10.5)
nRBC: 0 % (ref 0.0–0.2)

## 2021-11-11 LAB — SURGICAL PATHOLOGY

## 2021-11-11 MED ORDER — INDAPAMIDE 2.5 MG PO TABS
2.5000 mg | ORAL_TABLET | Freq: Every day | ORAL | Status: DC
  Administered 2021-11-11 – 2021-11-13 (×3): 2.5 mg via ORAL
  Filled 2021-11-11 (×3): qty 1

## 2021-11-11 MED ORDER — POTASSIUM CHLORIDE 10 MEQ/100ML IV SOLN
10.0000 meq | INTRAVENOUS | Status: AC
  Administered 2021-11-11 (×4): 10 meq via INTRAVENOUS
  Filled 2021-11-11 (×4): qty 100

## 2021-11-11 NOTE — Progress Notes (Signed)
Subjective:  No acute overnight events. Pt continues to complain of abdominal soreness around surgical incision sites. She is able to tolerate PO intake without any nausea or vomiting. She notes she has good output in her colostomy bag. She is able to walk to the bathroom with the assistance of a walker. No other complaints at this time.     Objective:  Vital signs in last 24 hours: Vitals:   11/10/21 2311 11/11/21 0352 11/11/21 0610 11/11/21 0714  BP: (!) 181/84 (!) 189/87 (!) 163/82 (!) 173/81  Pulse: 71 71 70 70  Resp: 18 18 17 17   Temp: 97.8 F (36.6 C) 98 F (36.7 C)  97.6 F (36.4 C)  TempSrc: Oral Axillary  Oral  SpO2: 97% 98% 98% 98%  Weight:  86.5 kg    Height:       Physical Exam: General: well-appearing female, lying in bed, NAD. CV: normal rate and regular rhythm, no m/r/g. Pulm: normal work of breathing, no respiratory distress noted. Abdomen: Distended abdomen with TTP around incision site. JP drain with serosanguinous drainage noted. Normoactive bowel sounds present. Midline wound is clean and dry. Colostomy with viable stoma, 60 ml of output noted.  Neuro: AAOx3, no focal deficits noted. Skin: warm and dry.  Assessment/Plan:  Principal Problem:   Perforation of sigmoid colon due to diverticulitis Active Problems:   Hypokalemia   Pulmonary hypertension (HCC)   Hypertension   Perforated diverticulum   AKI (acute kidney injury) (HCC)   Chronic back pain   Chronic, continuous use of opioids  Chelsea [name]. Gens is a 70 yo F with a history of HTN, HLD, GERD,  thoracic aortic aneurysm, mild aortic regurgitation, chronic pain on opioids, CKD, prior CVA and obesity presenting with abdominal pain and admitted for perforated diverticulitis, now on post-op day 3 s/p Hartmann's procedure.  Perforated diverticulitis s/p Hartmann's Procedure (PO Day 3) Post-op ileus-resolving Pt is able to tolerate FLD per surgery's recommendations today. Pt noted to have 60 ml of  output in colostomy bag today. Normoactive bowel sounds are present on evaluation.  Preliminary anaerobic cultures showing few GPC in clusters, few GPR, and few GNR. Currently on IV zosyn. Her WBC counts are decreasing to 12.2 from 15.4.  -Await cultures and sensitivities. -appreciate surgery recommendations -Advancing to full liquid diet per surgery -f/u pathology results -continue IV zosyn for 5 days post-op (day 3/5) -Pain is well controlled with dilaudid. Will wean off as tolerated. -trend CBC and BMP -ambulate as tolerated -PT/OT eval -WOC consulted for colostomy care and midline wound care  Mild Hyponatremia Mild Hypokalemia Serum sodium of 131 and potassium of 3.0. This is likely 2/2 decreased PO intake. She was also on isotonic saline infusions. Pt is on full liquid diets and is tolerating well. -Replete PRN -Encourage oral intake of FLD -Trend BMP  Acute Kidney Injury-resolving Serum creatinine decreasing from 1.23 yesterday to 1.05 today with increased maintenance fluids to 125cc/hr. Likely pre-renal 2/2 decreased PO intake and recent surgery.  -trend kidney function -avoid nephrotoxic medications -strict I/O's  Resistant Hypertension Aortic aneurysm 4 cm  Pt was restarted on home medications and SBPs were improving overnight. SBP decreasing from 150-200s to 160-180s today. Patient has a history of resistant hypertension and currently on 6 agent medication regimen at home. Chest CT obtained March 2021 confirmed thoracic aortic aneurysm measuring 4 cm in diameter. -Continue amlodipine 10 mg, irbesartan 300 mg, aldactone 25 mg and metoprolol succinate 100 mg  -Continue 0.2 mg clonidine  patch weekly -continue hydralazine 10 mg Q3H PRN to maintain SBP<180 -discontinue labetalol   Atrial Tachycardia - resolved EKG reveals atrial tachycardia with LVH likely secondary to sepsis possibly due to perforated diverticulitis. Seems to have resolved after starting IV labetalol. HR in  70s-80s today.    Best Practice: Diet: CLD IVF: no maintenance fluids given today VTE: Enoxaparin Code: Full  Signature: Claudean Kinds, MS-IV [facility]  Pager: [phone] 7:52 AM, 11/11/2021

## 2021-11-11 NOTE — Progress Notes (Signed)
Encouraged patient to get out of bed today patient stated " I will get up tomorrow, not today" will continue to encourage. Senan Urey, Bettina Gavia RN

## 2021-11-11 NOTE — Progress Notes (Signed)
Progress Note  3 Days Post-Op  Subjective: Some "soreness" but no real pain.  Up in chair and ambulating yesterday.  Voiding a lot more per patient.  Wants to go home.    Objective: Vital signs in last 24 hours: Temp:  [97.2 F (36.2 C)-98.1 F (36.7 C)] 97.6 F (36.4 C) (12/14 0714) Pulse Rate:  [70-90] 70 (12/14 0714) Resp:  [17-20] 17 (12/14 0714) BP: (163-207)/(81-98) 173/81 (12/14 0714) SpO2:  [97 %-100 %] 98 % (12/14 0714) Weight:  [86.5 kg] 86.5 kg (12/14 0352) Last BM Date: 11/10/21  Intake/Output from previous day: 12/13 0701 - 12/14 0700 In: 3543.6 [P.O.:480; I.V.:2871.7; IV Piggyback:191.9] Out: 3415 [Urine:3220; Drains:195] Intake/Output this shift: Total I/O In: -  Out: 430 [Urine:400; Drains:30]  PE: Abd: soft, tender to palpation as expected.  Midline wound is clean and packed with a small amount of skin ischemia on the left side at her umbilicus.  LMQ colostomy with viable stoma, with air and some stool present.    Lab Results:  Recent Labs    11/09/21 0215 11/11/21 0627  WBC 15.4* 12.2*  HGB 14.5 12.3  HCT 44.1 38.9  PLT 489* 480*   BMET Recent Labs    11/09/21 0215 11/10/21 0200  NA 133* 131*  K 4.7 3.8  CL 98 97*  CO2 25 24  GLUCOSE 133* 123*  BUN 11 12  CREATININE 1.23* 1.05*  CALCIUM 8.5* 8.1*   PT/INR No results for input(s): LABPROT, INR in the last 72 hours. CMP     Component Value Date/Time   NA 131 (L) 11/10/2021 0200   NA 144 10/22/2020 1352   K 3.8 11/10/2021 0200   CL 97 (L) 11/10/2021 0200   CO2 24 11/10/2021 0200   GLUCOSE 123 (H) 11/10/2021 0200   BUN 12 11/10/2021 0200   BUN 16 10/22/2020 1352   CREATININE 1.05 (H) 11/10/2021 0200   CREATININE 1.11 (H) 04/20/2016 1409   CALCIUM 8.1 (L) 11/10/2021 0200   PROT 7.4 11/04/2021 1318   PROT 6.8 08/11/2020 1058   ALBUMIN 2.9 (L) 11/04/2021 1318   ALBUMIN 3.8 08/11/2020 1058   AST 14 (L) 11/04/2021 1318   ALT 11 11/04/2021 1318   ALKPHOS 93 11/04/2021 1318    BILITOT 0.5 11/04/2021 1318   BILITOT 0.4 08/11/2020 1058   GFRNONAA 58 (L) 11/10/2021 0200   GFRAA 42 (L) 10/22/2020 1352   Lipase     Component Value Date/Time   LIPASE 27 11/04/2021 1318       Studies/Results: No results found.  Anti-infectives: Anti-infectives (From admission, onward)    Start     Dose/Rate Route Frequency Ordered Stop   11/07/21 1400  neomycin (MYCIFRADIN) tablet 1,000 mg       See Hyperspace for full Linked Orders Report.   1,000 mg Oral 3 times per day 11/07/21 1111 11/07/21 2254   11/07/21 1400  metroNIDAZOLE (FLAGYL) tablet 1,000 mg       See Hyperspace for full Linked Orders Report.   1,000 mg Oral 3 times per day 11/07/21 1111 11/07/21 2100   11/07/21 1200  cefoTEtan (CEFOTAN) 2 g in sodium chloride 0.9 % 100 mL IVPB        2 g 200 mL/hr over 30 Minutes Intravenous On call to O.R. 11/07/21 1111 11/08/21 1139   11/05/21 0400  piperacillin-tazobactam (ZOSYN) IVPB 3.375 g        3.375 g 12.5 mL/hr over 240 Minutes Intravenous Every 8 hours 11/04/21 2018 11/13/21  2359   11/04/21 2015  piperacillin-tazobactam (ZOSYN) IVPB 3.375 g        3.375 g 100 mL/hr over 30 Minutes Intravenous  Once 11/04/21 2009 11/04/21 2253        Assessment/Plan POD 3, s/p Hartmann's by Dr. Grandville Silos 11/08/21 for Perforated diverticulitis  - adv to FLD today.  Tolerating CLD well with bag working -ambulated in halls TID, PT consult, HH recommended -cont abx therapy for 5 days post op -multi-modal pain control -NS WD dressing changes to midline wound BID -WOC consult for new colostomy -WBC down to 12K today.  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

## 2021-11-11 NOTE — Progress Notes (Signed)
Charted vitals under the wrong pt( RM 9)

## 2021-11-11 NOTE — Progress Notes (Signed)
Occupational Therapy Treatment Patient Details Name: Chelsea [name] MRN: [id number] DOB: 11/03/1951 Today's Date: 11/11/2021   History of present illness The pt is a 70 yo female presenting 12/7 with constipation, nausea, and vomiting. 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 PMH inlcudes: HTN, HLD, GERD, CKD, CVA with residual L sided weakness.   OT comments  Pt progressing towards OT goals, remains limited by abdominal "soreness" and activity tolerance deficits. Pt functioning near baseline with Min A at most needed for bathing tasks seated/standing at sink. Pt reports baseline assist with bathing tasks from aide/daughter at home. Reinforced energy conservation, abdominal precautions and emphasis on log rolling to minimize discomfort getting in/out of bed. Continue to rec HHOT follow-up with family/aide support at home.    Recommendations for follow up therapy are one component of a multi-disciplinary discharge planning process, led by the attending physician.  Recommendations may be updated based on patient status, additional functional criteria and insurance authorization.    Follow Up Recommendations  Home health OT    Assistance Recommended at Discharge Frequent or constant Supervision/Assistance  Equipment Recommendations  Other (comment) (may benefit from bedrail attachment at home)    Recommendations for Other Services      Precautions / Restrictions Precautions Precautions: Other (comment);Fall Precaution Comments: abdominal, colostomy, JP drain Restrictions Weight Bearing Restrictions: No       Mobility Bed Mobility Overal bed mobility: Needs Assistance Bed Mobility: Supine to Sit;Rolling;Sit to Sidelying Rolling: Supervision   Supine to sit: Supervision;HOB elevated   Sit to sidelying: Min guard General bed mobility comments: due to abdominal discomfort, cued for log  rolling, pt reliant on bedrail use    Transfers Overall transfer level: Needs assistance Equipment used: Rolling walker (2 wheels) Transfers: Sit to/from Stand Sit to Stand: Min guard           General transfer comment: increased time to rise, able to stand from bedside and chair w/o armrests     Balance Overall balance assessment: Needs assistance Sitting-balance support: No upper extremity supported;Feet supported Sitting balance-Leahy Scale: Fair     Standing balance support: Bilateral upper extremity supported;Single extremity supported;During functional activity Standing balance-Leahy Scale: Poor Standing balance comment: reliant on at least one UE support in standing                           ADL either performed or assessed with clinical judgement   ADL Overall ADL's : Needs assistance/impaired     Grooming: Set up;Sitting;Wash/dry face Grooming Details (indicate cue type and reason): seated at sink Upper Body Bathing: Minimal assistance;Sitting Upper Body Bathing Details (indicate cue type and reason): to bathe under R arm due to residual deficits with L hand Lower Body Bathing: Minimal assistance;Sit to/from stand Lower Body Bathing Details (indicate cue type and reason): able to bathe peri region without assist in standing though shakiness with prolonged standing. reports assist for B feet at baseline Upper Body Dressing : Set up;Sitting   Lower Body Dressing: Moderate assistance;Sit to/from stand Lower Body Dressing Details (indicate cue type and reason): able to bring L foot to self to pull up sock sitting EOB             Functional mobility during ADLs: Min guard;Rolling walker (2 wheels) General ADL Comments: Continued soreness limiting trunk ROM though pt progressing with mobility/activity tolerance during ADLs and appears close to  baseline (light assist for ADLs baseline)    Extremity/Trunk Assessment Upper Extremity Assessment Upper  Extremity Assessment: LUE deficits/detail LUE Deficits / Details: pt with residual deficits from CVA, impaired coordination but grossly able to generate 3+/5 to MMT when positioned. tremors with effort. LUE Sensation: decreased light touch LUE Coordination: decreased fine motor;decreased gross motor   Lower Extremity Assessment Lower Extremity Assessment: Defer to PT evaluation        Vision   Vision Assessment?: No apparent visual deficits   Perception     Praxis      Cognition Arousal/Alertness: Awake/alert Behavior During Therapy: Flat affect Overall Cognitive Status: No family/caregiver present to determine baseline cognitive functioning Area of Impairment: Safety/judgement;Problem solving                         Safety/Judgement: Decreased awareness of safety   Problem Solving: Slow processing General Comments: flat affect, slower processing but follows all directions          Exercises     Shoulder Instructions       General Comments      Pertinent Vitals/ Pain       Pain Assessment: Faces Faces Pain Scale: Hurts little more Pain Location: abdomen, incision Pain Descriptors / Indicators: Sore Pain Intervention(s): Monitored during session;Limited activity within patient's tolerance  Home Living                                          Prior Functioning/Environment              Frequency  Min 2X/week        Progress Toward Goals  OT Goals(current goals can now be found in the care plan section)  Progress towards OT goals: Progressing toward goals  Acute Rehab OT Goals Patient Stated Goal: decrease soreness OT Goal Formulation: With patient Time For Goal Achievement: 11/24/21 Potential to Achieve Goals: Good ADL Goals Pt Will Perform Grooming: with modified independence;standing Pt Will Transfer to Toilet: with modified independence;ambulating Pt Will Perform Toileting - Clothing Manipulation and hygiene:  with modified independence;sit to/from stand;sitting/lateral leans Additional ADL Goal #1: Pt to increase activity tolerance > 10 min during functional tasks to improve overall endurance  Plan Discharge plan remains appropriate    Co-evaluation                 AM-PAC OT "6 Clicks" Daily Activity     Outcome Measure   Help from another person eating meals?: None Help from another person taking care of personal grooming?: A Little Help from another person toileting, which includes using toliet, bedpan, or urinal?: A Lot Help from another person bathing (including washing, rinsing, drying)?: A Lot Help from another person to put on and taking off regular upper body clothing?: A Little Help from another person to put on and taking off regular lower body clothing?: A Lot 6 Click Score: 16    End of Session Equipment Utilized During Treatment: Rolling walker (2 wheels)  OT Visit Diagnosis: Unsteadiness on feet (R26.81);Other abnormalities of gait and mobility (R26.89);Muscle weakness (generalized) (M62.81);Pain Pain - part of body:  (abdomen)   Activity Tolerance Patient tolerated treatment well   Patient Left in bed;with call bell/phone within reach   Nurse Communication Mobility status;Other (comment) (discussed bath with NT)        Time: 6256-3893 OT Time  Calculation (min): 24 min  Charges: OT General Charges $OT Visit: 1 Visit OT Treatments $Self Care/Home Management : 23-37 mins  Malachy Chamber, OTR/L Acute Rehab Services Office: [phone]   Layla Maw 11/11/2021, 9:48 AM

## 2021-11-12 DIAGNOSIS — I4891 Unspecified atrial fibrillation: Secondary | ICD-10-CM

## 2021-11-12 LAB — BASIC METABOLIC PANEL
Anion gap: 8 (ref 5–15)
BUN: 6 mg/dL — ABNORMAL LOW (ref 8–23)
CO2: 26 mmol/L (ref 22–32)
Calcium: 8.4 mg/dL — ABNORMAL LOW (ref 8.9–10.3)
Chloride: 100 mmol/L (ref 98–111)
Creatinine, Ser: 0.76 mg/dL (ref 0.44–1.00)
GFR, Estimated: >60 mL/min (ref >60)
Glucose, Bld: 91 mg/dL (ref 70–99)
Potassium: 3.5 mmol/L (ref 3.5–5.1)
Sodium: 134 mmol/L — ABNORMAL LOW (ref 135–145)

## 2021-11-12 LAB — MAGNESIUM: Magnesium: 1.7 mg/dL (ref 1.7–2.4)

## 2021-11-12 LAB — AEROBIC/ANAEROBIC CULTURE W GRAM STAIN (SURGICAL/DEEP WOUND)

## 2021-11-12 MED ORDER — METOPROLOL TARTRATE 5 MG/5ML IV SOLN
2.5000 mg | INTRAVENOUS | Status: AC | PRN
  Administered 2021-11-12 (×3): 2.5 mg via INTRAVENOUS
  Filled 2021-11-12 (×2): qty 5

## 2021-11-12 MED ORDER — POTASSIUM CHLORIDE CRYS ER 20 MEQ PO TBCR
40.0000 meq | EXTENDED_RELEASE_TABLET | Freq: Once | ORAL | Status: AC
  Administered 2021-11-12: 40 meq via ORAL
  Filled 2021-11-12: qty 2

## 2021-11-12 MED ORDER — METOPROLOL SUCCINATE ER 50 MG PO TB24
150.0000 mg | ORAL_TABLET | Freq: Every day | ORAL | Status: DC
  Administered 2021-11-13: 150 mg via ORAL
  Filled 2021-11-12: qty 1

## 2021-11-12 MED ORDER — MAGNESIUM SULFATE 2 GM/50ML IV SOLN
2.0000 g | Freq: Once | INTRAVENOUS | Status: AC
  Administered 2021-11-12: 2 g via INTRAVENOUS
  Filled 2021-11-12: qty 50

## 2021-11-12 MED ORDER — METOPROLOL SUCCINATE ER 50 MG PO TB24
50.0000 mg | ORAL_TABLET | Freq: Once | ORAL | Status: AC
  Administered 2021-11-12: 50 mg via ORAL
  Filled 2021-11-12: qty 1

## 2021-11-12 NOTE — Progress Notes (Signed)
Physical Therapy Treatment Patient Details Name: Chelsea [name] MRN: [id number] DOB: 1951-08-25 Today's Date: 11/12/2021   History of Present Illness The pt is a 70 yo female presenting 12/7 with constipation, nausea, and vomiting. 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 PMH inlcudes: HTN, HLD, GERD, CKD, CVA with residual L sided weakness.    PT Comments    Pt is progressing with mobility. Limited ambulation today due to recent a-fib and increased HR, but pt still motivated to participate despite this. Pt limited by decreased functional mobility, impaired balance, decreased activity tolerance, and generalized weakness. Continue to recommend HHPT at d/c to maximize functional mobility and independence. Will continue to follow acutely to address established goals.     Recommendations for follow up therapy are one component of a multi-disciplinary discharge planning process, led by the attending physician.  Recommendations may be updated based on patient status, additional functional criteria and insurance authorization.  Follow Up Recommendations  Home health PT     Assistance Recommended at Discharge Frequent or constant Supervision/Assistance  Equipment Recommendations  BSC/3in1;Wheelchair (measurements PT);Wheelchair cushion (measurements PT) (TBD)    Recommendations for Other Services       Precautions / Restrictions Precautions Precautions: Other (comment);Fall Precaution Comments: abdominal, colostomy, JP drain Restrictions Weight Bearing Restrictions: No     Mobility  Bed Mobility Overal bed mobility: Needs Assistance Bed Mobility: Supine to Sit;Sit to Supine     Supine to sit: HOB elevated;Min assist Sit to supine: Supervision   General bed mobility comments: min A for trunk elevation provided by nurse tech, unsure if actually needed    Transfers Overall transfer  level: Needs assistance Equipment used: Rolling walker (2 wheels) Transfers: Sit to/from Stand Sit to Stand: Min guard           General transfer comment: increased time to rise, stood 3x from EOB and once from [facility], min guard for safety    Ambulation/Gait Ambulation/Gait assistance: Min guard Gait Distance (Feet): 12 Feet (+20, +6, +6) Assistive device: Rolling walker (2 wheels) Gait Pattern/deviations: Decreased weight shift to left;Step-through pattern;Decreased stride length Gait velocity: decreased     General Gait Details: Limited to in-room ambulation today due to a-fib earlier and increased HR. Mutliple rest breaks needed. Pt's gait pattern improved and more stable than last visit. Min guard for Barrister's clerk    Modified Rankin (Stroke Patients Only)       Balance Overall balance assessment: Needs assistance Sitting-balance support: No upper extremity supported;Feet supported Sitting balance-Leahy Scale: Fair     Standing balance support: Bilateral upper extremity supported;During functional activity Standing balance-Leahy Scale: Poor Standing balance comment: reliant on RW during static standing and mobility                            Cognition Arousal/Alertness: Awake/alert Behavior During Therapy: Flat affect Overall Cognitive Status: No family/caregiver present to determine baseline cognitive functioning Area of Impairment: Safety/judgement;Problem solving                         Safety/Judgement: Decreased awareness of safety   Problem Solving: Slow processing General Comments: slower processing but follows all directions        Exercises      General Comments General comments (  skin integrity, edema, etc.): HR 112 before mobility, up to 148 with bed mobility, 167 with ambulation, 137 in return to supine      Pertinent Vitals/Pain Pain Assessment: No/denies pain    Home Living                           Prior Function            PT Goals (current goals can now be found in the care plan section) Acute Rehab PT Goals Patient Stated Goal: to return home PT Goal Formulation: With patient Time For Goal Achievement: 11/23/21 Potential to Achieve Goals: Good Progress towards PT goals: Progressing toward goals    Frequency    Min 3X/week      PT Plan      Co-evaluation              AM-PAC PT "6 Clicks" Mobility   Outcome Measure  Help needed turning from your back to your side while in a flat bed without using bedrails?: A Little Help needed moving from lying on your back to sitting on the side of a flat bed without using bedrails?: A Little Help needed moving to and from a bed to a chair (including a wheelchair)?: A Little Help needed standing up from a chair using your arms (e.g., wheelchair or bedside chair)?: A Little Help needed to walk in hospital room?: A Little Help needed climbing 3-5 steps with a railing? : A Lot 6 Click Score: 17    End of Session Equipment Utilized During Treatment: Gait belt Activity Tolerance: Patient tolerated treatment well;Treatment limited secondary to medical complications (Comment) Patient left: in bed;with nursing/sitter in room Nurse Communication: Mobility status PT Visit Diagnosis: Other abnormalities of gait and mobility (R26.89);Muscle weakness (generalized) (M62.81);Unsteadiness on feet (R26.81)     Time: 0802-2336 PT Time Calculation (min) (ACUTE ONLY): 18 min  Charges:  $Therapeutic Activity: 8-22 mins                     Brandon Melnick, SPT    Brandon Melnick 11/12/2021, 1:51 PM

## 2021-11-12 NOTE — Progress Notes (Signed)
Progress Note  4 Days Post-Op  Subjective: Didn't walk yesterday, but ate full liquids with no nausea.  Ostomy is working well.  Objective: Vital signs in last 24 hours: Temp:  [97.5 F (36.4 C)-98.1 F (36.7 C)] 97.9 F (36.6 C) (12/15 0344) Pulse Rate:  [66-78] 66 (12/15 0344) Resp:  [17-20] 19 (12/15 0344) BP: (153-188)/(77-95) 156/79 (12/15 0344) SpO2:  [96 %-99 %] 96 % (12/15 0344) Last BM Date: 11/12/21  Intake/Output from previous day: 12/14 0701 - 12/15 0700 In: 2705.4 [P.O.:480; I.V.:1782.1; IV Piggyback:443.3] Out: 3411 [Urine:3300; Drains:110; Stool:1] Intake/Output this shift: No intake/output data recorded.  PE: Abd: soft, tender to palpation as expected.  Midline wound is clean and packed with a small amount of skin ischemia on the left side at her umbilicus.  LMQ colostomy with viable stoma, with air and some stool present.    Lab Results:  Recent Labs    11/11/21 0627  WBC 12.2*  HGB 12.3  HCT 38.9  PLT 480*   BMET Recent Labs    11/11/21 1828 11/12/21 0353  NA 131* 134*  K 3.9 3.5  CL 100 100  CO2 24 26  GLUCOSE 139* 91  BUN 6* 6*  CREATININE 0.83 0.76  CALCIUM 8.3* 8.4*   PT/INR No results for input(s): LABPROT, INR in the last 72 hours. CMP     Component Value Date/Time   NA 134 (L) 11/12/2021 0353   NA 144 10/22/2020 1352   K 3.5 11/12/2021 0353   CL 100 11/12/2021 0353   CO2 26 11/12/2021 0353   GLUCOSE 91 11/12/2021 0353   BUN 6 (L) 11/12/2021 0353   BUN 16 10/22/2020 1352   CREATININE 0.76 11/12/2021 0353   CREATININE 1.11 (H) 04/20/2016 1409   CALCIUM 8.4 (L) 11/12/2021 0353   PROT 7.4 11/04/2021 1318   PROT 6.8 08/11/2020 1058   ALBUMIN 2.9 (L) 11/04/2021 1318   ALBUMIN 3.8 08/11/2020 1058   AST 14 (L) 11/04/2021 1318   ALT 11 11/04/2021 1318   ALKPHOS 93 11/04/2021 1318   BILITOT 0.5 11/04/2021 1318   BILITOT 0.4 08/11/2020 1058   GFRNONAA >60 11/12/2021 0353   GFRAA 42 (L) 10/22/2020 1352   Lipase      Component Value Date/Time   LIPASE 27 11/04/2021 1318       Studies/Results: No results found.  Anti-infectives: Anti-infectives (From admission, onward)    Start     Dose/Rate Route Frequency Ordered Stop   11/07/21 1400  neomycin (MYCIFRADIN) tablet 1,000 mg       See Hyperspace for full Linked Orders Report.   1,000 mg Oral 3 times per day 11/07/21 1111 11/07/21 2254   11/07/21 1400  metroNIDAZOLE (FLAGYL) tablet 1,000 mg       See Hyperspace for full Linked Orders Report.   1,000 mg Oral 3 times per day 11/07/21 1111 11/07/21 2100   11/07/21 1200  cefoTEtan (CEFOTAN) 2 g in sodium chloride 0.9 % 100 mL IVPB        2 g 200 mL/hr over 30 Minutes Intravenous On call to O.R. 11/07/21 1111 11/08/21 1139   11/05/21 0400  piperacillin-tazobactam (ZOSYN) IVPB 3.375 g        3.375 g 12.5 mL/hr over 240 Minutes Intravenous Every 8 hours 11/04/21 2018 11/13/21 2359   11/04/21 2015  piperacillin-tazobactam (ZOSYN) IVPB 3.375 g        3.375 g 100 mL/hr over 30 Minutes Intravenous  Once 11/04/21 2009 11/04/21 2253  Assessment/Plan POD 4, s/p Hartmann's by Dr. Grandville Silos 11/08/21 for Perforated diverticulitis  - adv to soft diet today.   -ambulated in halls TID, PT consult, HH recommended -cont abx therapy for 5 days post op -multi-modal pain control -NS WD dressing changes to midline wound BID -WOC consult for new colostomy -hopefully can plan for DC home within the next 1-2 days pending continued advancement.  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

## 2021-11-12 NOTE — Progress Notes (Signed)
Pt HR back up to 130's after working with PT.  Third and final PRN metoprolol push given.  BP tolerating.  Will cont to monitor closely.

## 2021-11-12 NOTE — Care Management Important Message (Signed)
Important Message  Patient Details  Name: Chelsea [name] MRN: [id number] Date of Birth: 30-Oct-1951   Medicare Important Message Given:  Yes     [name] 11/12/2021, 9:29 AM

## 2021-11-12 NOTE — Consult Note (Signed)
[place] Nurse ostomy follow up Stoma type/location: LLQ colostomy pouch change. Daughter is not present and cannot come today. We are performing a pouch change and teaching.  The pouch had opened and there was some leakage.  Stomal assessment/size: 1 5/8" slightly budded  edema to abdomen ongoing, soft and nontender. measuring guide marked and in ostomy teaching folder Peristomal assessment: intact bruising noted from 4 to 7 o'clock.  Treatment options for stomal/peristomal skin: barrier ring 2 3/4" pouch. Output soft brown stool in pouch Ostomy pouching: 2pc. 2 3/4" pouch with barrier ring Education provided: Pouch change performed  Patient with history CVA and left sided residual weakness. Will be minimally participative, but should be able to eventually open/close and empty with minimal assist. Discussed twice weekly pouch changes, emptying when 1/3 to 1/2 full. Information for outpatient clinic in folder.  Enrolled patient in [place] Start Discharge program: Yes will today.  Will order 5 pouch sets today for discharge.  Will not follow at this time.  Please re-consult if needed.  Domenic Moras MSN, RN, FNP-BC CWON Wound, Ostomy, Continence Nurse Pager [phone]

## 2021-11-12 NOTE — Progress Notes (Signed)
Pt with new onset afib RVR.  MD made aware.  Orders received and carried out.  HR with better control after 2 IV pushes of metoprolol, 2.5mg  each.  Will cont to monitor closely.

## 2021-11-12 NOTE — Progress Notes (Signed)
Subjective:  No acute overnight events. Pt reports that her abdominal soreness is resolving. She is able to tolerate PO intake without any nausea or vomiting. She notes she has good output in her colostomy bag and had a BM yesterday. She is able to walk to the bathroom with the assistance of a walker. This morning patient also endorsed palpitations noting that she has had multiple episodes of palpitations in the past with exertion. No other complaints at this time.     Objective:  Vital signs in last 24 hours: Vitals:   11/11/21 1735 11/11/21 2008 11/11/21 2354 11/12/21 0344  BP: (!) 179/85 (!) 181/93 (!) 153/77 (!) 156/79  Pulse:  73 67 66  Resp: 20 20 17 19   Temp:  (!) 97.5 F (36.4 C) 98.1 F (36.7 C) 97.9 F (36.6 C)  TempSrc:  Oral Oral Oral  SpO2:  99% 99% 96%  Weight:      Height:       Physical Exam: General: well-appearing female, lying in bed, NAD. CV: tachycardic and irregular rhythm, no m/r/g. Pulm: normal work of breathing, no respiratory distress noted. Abdomen: Distended abdomen with TTP around incision site. JP drain with serosanguinous drainage noted. Mild abdominal tenderness around incisional site. Normoactive bowel sounds present. Midline wound is clean and dry. Colostomy with viable stoma, minimal output noted.  Neuro: AAOx3, no focal deficits noted. Skin: warm and dry.  Assessment/Plan:  Principal Problem:   Perforation of sigmoid colon due to diverticulitis Active Problems:   Hypokalemia   Pulmonary hypertension (HCC)   Hypertension   Perforated diverticulum   AKI (acute kidney injury) (HCC)   Chronic back pain   Chronic, continuous use of opioids  Chelsea [name] is a 70 yo F with a history of HTN, HLD, GERD,  thoracic aortic aneurysm, mild aortic regurgitation, chronic pain on opioids, CKD, prior CVA and obesity presenting with abdominal pain and admitted for perforated diverticulitis, now on post-op day 4 s/p Hartmann's  procedure.  Perforated diverticulitis s/p Hartmann's Procedure (PO Day 4) Post-op ileus-resolving Pt's diet has been advanced to soft diet per surgery and she has been able to tolerate oral intake without nausea or vomiting. JP drain noticed to drain 110 cc of output yesterday. Pt has good drainage into colostomy bag. Mild abdominal tenderness around incisional site. Normoactive bowel sounds are present on evaluation.  Final surgical cultures showing E coli, candida albicans, streptococcus mutans and parabacteriodes distasonis . Currently on IV zosyn. Her WBC counts are decreasing to 12.2. -appreciate surgery recommendations -Advancing to soft diet and ambulate in halls TID per surgery -f/u pathology results -continue IV zosyn for 5 days post-op (day 4/5) -Pain is well controlled with dilaudid. Will wean off as tolerated. -trend CBC and BMP -ambulate as tolerated -PT/OT eval -WOC consulted for colostomy care and midline wound care  New onset atrial fibrillation Pt is found to be in atrial fibrillation on telemetry and on EKG, pt had A fib with RVR with ventricular rate of 152. Literature indicates that the incidence of afib after a noncardiac surgical procedure ranges from 0.8% to 29%. Patient is stable on an additional 150 mg of metoprolol and is found to be in NSR on telemetry at this time. -Given 150 mg of metoprolol succinate for rate control -will obtain echocardiogram  -will obtain TSH   Mild Hyponatremia-improving Mild Hypokalemia-improved Serum sodium of 134 and potassium of 3.5 today. Pt improving with good oral intake and KCl supplementation via IV. -Replete PRN -  Encourage oral intake of soft diet -Trend BMP  Acute Kidney Injury-resolved Serum creatinine decreasing from 0.76 yesterday to 1.23 today. Likely pre-renal 2/2 decreased PO intake and recent surgery.  -trend kidney function -avoid nephrotoxic medications -strict I/O's  Resistant Hypertension Aortic aneurysm 4 cm   Pt was restarted on home medications and SBPs were improving overnight. SBP decreasing from 160-180s yesterday to 150s today. Patient has a history of resistant hypertension and currently on 6 agent medication regimen at home. Chest CT obtained March 2021 confirmed thoracic aortic aneurysm measuring 4 cm in diameter. -Continue amlodipine 10 mg, irbesartan 300 mg, aldactone 25 mg and metoprolol succinate 100 mg  -Continue 0.2 mg clonidine patch weekly -continue hydralazine 10 mg Q3H PRN to maintain SBP<180 -discontinue labetalol   Atrial Tachycardia - resolved EKG reveals atrial tachycardia with LVH likely secondary to sepsis possibly due to perforated diverticulitis. Seems to have resolved after starting IV labetalol. HR in 70s-80s today.    Best Practice: Diet: CLD IVF: no maintenance fluids given today VTE: Enoxaparin Code: Full  Signature: Claudean Kinds, MS-IV [facility]  Pager: [phone] 7:12 AM, 11/12/2021

## 2021-11-13 ENCOUNTER — Other Ambulatory Visit (HOSPITAL_COMMUNITY): Payer: Medicare Other

## 2021-11-13 ENCOUNTER — Inpatient Hospital Stay (HOSPITAL_COMMUNITY): Payer: Medicare Other

## 2021-11-13 DIAGNOSIS — I4891 Unspecified atrial fibrillation: Secondary | ICD-10-CM

## 2021-11-13 LAB — CBC
HCT: 40.7 % (ref 36.0–46.0)
Hemoglobin: 13.2 g/dL (ref 12.0–15.0)
MCH: 29.1 pg (ref 26.0–34.0)
MCHC: 32.4 g/dL (ref 30.0–36.0)
MCV: 89.6 fL (ref 80.0–100.0)
Platelets: 415 10*3/uL — ABNORMAL HIGH (ref 150–400)
RBC: 4.54 MIL/uL (ref 3.87–5.11)
RDW: 14.4 % (ref 11.5–15.5)
WBC: 12 10*3/uL — ABNORMAL HIGH (ref 4.0–10.5)
nRBC: 0 % (ref 0.0–0.2)

## 2021-11-13 LAB — BASIC METABOLIC PANEL
Anion gap: 9 (ref 5–15)
BUN: 8 mg/dL (ref 8–23)
CO2: 24 mmol/L (ref 22–32)
Calcium: 8.3 mg/dL — ABNORMAL LOW (ref 8.9–10.3)
Chloride: 99 mmol/L (ref 98–111)
Creatinine, Ser: 0.88 mg/dL (ref 0.44–1.00)
GFR, Estimated: >60 mL/min (ref >60)
Glucose, Bld: 92 mg/dL (ref 70–99)
Potassium: 4.8 mmol/L (ref 3.5–5.1)
Sodium: 132 mmol/L — ABNORMAL LOW (ref 135–145)

## 2021-11-13 LAB — ECHOCARDIOGRAM COMPLETE
AR max vel: 1.8 cm2
AV Area VTI: 1.81 cm2
AV Area mean vel: 1.65 cm2
AV Mean grad: 15 mmHg
AV Peak grad: 26.4 mmHg
Ao pk vel: 2.57 m/s
Area-P 1/2: 3.31 cm2
Calc EF: 54.9 %
Height: 66 in
P 1/2 time: 400 msec
S' Lateral: 3.4 cm
Single Plane A2C EF: 57.2 %
Single Plane A4C EF: 55.5 %
Weight: 3051.17 oz

## 2021-11-13 LAB — TSH: TSH: 1.934 u[IU]/mL (ref 0.350–4.500)

## 2021-11-13 LAB — MAGNESIUM: Magnesium: 2.5 mg/dL — ABNORMAL HIGH (ref 1.7–2.4)

## 2021-11-13 MED ORDER — METOPROLOL SUCCINATE ER 50 MG PO TB24: 150.0000 mg | ORAL_TABLET | Freq: Every day | ORAL | 1 refills | Status: DC

## 2021-11-13 MED ORDER — TRAMADOL HCL 50 MG PO TABS: 50.0000 mg | ORAL_TABLET | Freq: Four times a day (QID) | ORAL | 0 refills | Status: DC | PRN

## 2021-11-13 MED ORDER — ACETAMINOPHEN 500 MG PO TABS: 1000.0000 mg | ORAL_TABLET | Freq: Four times a day (QID) | ORAL | 0 refills | Status: DC | PRN

## 2021-11-13 MED ORDER — METHOCARBAMOL 750 MG PO TABS: 750.0000 mg | ORAL_TABLET | Freq: Three times a day (TID) | ORAL | 0 refills | Status: DC

## 2021-11-13 NOTE — Progress Notes (Signed)
Progress Note  5 Days Post-Op  Subjective: Had transient A fib yesterday, back in NSR today.  Pain well controlled.  Tolerating a soft diet well with great colostomy output.  Daughter came to watch colostomy training.  No family to observe dressing change yet.  Objective: Vital signs in last 24 hours: Temp:  [97.7 F (36.5 C)-98.1 F (36.7 C)] 98.1 F (36.7 C) (12/16 0801) Pulse Rate:  [68-100] 78 (12/16 0801) Resp:  [18-20] 18 (12/16 0801) BP: (148-184)/(80-85) 166/84 (12/16 0801) SpO2:  [96 %-100 %] 100 % (12/16 0801) Last BM Date: 11/12/21  Intake/Output from previous day: 12/15 0701 - 12/16 0700 In: 1218.5 [P.O.:1052; IV Piggyback:166.5] Out: 2700 [Urine:2550; Drains:60; Stool:90] Intake/Output this shift: No intake/output data recorded.  PE: Abd: soft, tender to palpation as expected.  Midline wound is clean with some mild early darkening of the very base of her wound along the fascia, but the fascia is intact with no separation.  LMQ colostomy with viable stoma, with air and some stool present.   JP with minimal serous output.  Removed with no issues.    Lab Results:  Recent Labs    11/11/21 0627 11/13/21 0736  WBC 12.2* 12.0*  HGB 12.3 13.2  HCT 38.9 40.7  PLT 480* 415*   BMET Recent Labs    11/12/21 0353 11/13/21 0224  NA 134* 132*  K 3.5 4.8  CL 100 99  CO2 26 24  GLUCOSE 91 92  BUN 6* 8  CREATININE 0.76 0.88  CALCIUM 8.4* 8.3*   PT/INR No results for input(s): LABPROT, INR in the last 72 hours. CMP     Component Value Date/Time   NA 132 (L) 11/13/2021 0224   NA 144 10/22/2020 1352   K 4.8 11/13/2021 0224   CL 99 11/13/2021 0224   CO2 24 11/13/2021 0224   GLUCOSE 92 11/13/2021 0224   BUN 8 11/13/2021 0224   BUN 16 10/22/2020 1352   CREATININE 0.88 11/13/2021 0224   CREATININE 1.11 (H) 04/20/2016 1409   CALCIUM 8.3 (L) 11/13/2021 0224   PROT 7.4 11/04/2021 1318   PROT 6.8 08/11/2020 1058   ALBUMIN 2.9 (L) 11/04/2021 1318   ALBUMIN  3.8 08/11/2020 1058   AST 14 (L) 11/04/2021 1318   ALT 11 11/04/2021 1318   ALKPHOS 93 11/04/2021 1318   BILITOT 0.5 11/04/2021 1318   BILITOT 0.4 08/11/2020 1058   GFRNONAA >60 11/13/2021 0224   GFRAA 42 (L) 10/22/2020 1352   Lipase     Component Value Date/Time   LIPASE 27 11/04/2021 1318       Studies/Results: No results found.  Anti-infectives: Anti-infectives (From admission, onward)    Start     Dose/Rate Route Frequency Ordered Stop   11/07/21 1400  neomycin (MYCIFRADIN) tablet 1,000 mg       See Hyperspace for full Linked Orders Report.   1,000 mg Oral 3 times per day 11/07/21 1111 11/07/21 2254   11/07/21 1400  metroNIDAZOLE (FLAGYL) tablet 1,000 mg       See Hyperspace for full Linked Orders Report.   1,000 mg Oral 3 times per day 11/07/21 1111 11/07/21 2100   11/07/21 1200  cefoTEtan (CEFOTAN) 2 g in sodium chloride 0.9 % 100 mL IVPB        2 g 200 mL/hr over 30 Minutes Intravenous On call to O.R. 11/07/21 1111 11/08/21 1139   11/05/21 0400  piperacillin-tazobactam (ZOSYN) IVPB 3.375 g        3.375  g 12.5 mL/hr over 240 Minutes Intravenous Every 8 hours 11/04/21 2018 11/13/21 2359   11/04/21 2015  piperacillin-tazobactam (ZOSYN) IVPB 3.375 g        3.375 g 100 mL/hr over 30 Minutes Intravenous  Once 11/04/21 2009 11/04/21 2253        Assessment/Plan POD 5, s/p Hartmann's by Dr. Grandville Silos 11/08/21 for Perforated diverticulitis  - soft diet.   -ambulated in halls TID, PT consult, HH recommended -cont abx therapy for 5 days post op, today is last day -WBC is stable at 12K, AF -multi-modal pain control -NS WD dressing changes to midline wound BID, needs family teaching, but [place] order placed for this as well in case this can be arranged. -WOC consult for new colostomy -surgically patient is stable and doing well.  Follow up pending.  DC instructions and wound/colostomy instructions in the DC section.  Prescription for robaxin and tramadol sent to pharmacy.   Outpatient referral made to ostomy clinic for assistance and help with this. 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

## 2021-11-13 NOTE — TOC Transition Note (Signed)
Transition of Care (TOC) - CM/SW Discharge Note Marvetta Gibbons RN, BSN Transitions of Care Unit 4E- RN Case Manager See Treatment Team for direct phone #    Patient Details  Name: Chelsea [name] MRN: [id number] Date of Birth: 1951/03/07  Transition of Care [facility]) CM/SW Contact:  Dawayne Patricia, RN Phone Number: 11/13/2021, 4:54 PM   Clinical Narrative:    1600- Have confirmed with rounding team pt is stable for transition home today, Call made to [facility] and they will be able to service pt for [facility] needs- with planned visit for tomorrow - they will contact pt to schedule.  CM has spoken with pt and daughter who are aware [facility] will service for [facility] needs. Ostomy supplies will be sent home with patient. Bedside education has been done per WOC- pt lives with daughter.  Family to provide transportation home.   1219- spoke with pt and family (daughter) at the bedside- pt voiced she is ready to go home today, daughter waiting on [place] to come to bedside education. Daughter has spoken with [place] regarding [place]- but is concerned that if pt does not discharge today [facility] will be unable to accept pt for [facility]- explained that should pt have to stay TOC will have to look into another [facility] agency to secure for needed [facility]. All are agreeable. CM will reach out to attending team to see if pt is medically stable for discharge today,  Call made and spoke with Dr. Alvie Heidelberg- who informed CM that team would re-assess pt later this afternoon to see if she is stable for discharge. - updated pt/daughter and [facility] that if pt transitioned home today it would be later. TOC will f/u later today   Final next level of care: [place] Barriers to Discharge: Barriers Resolved   Patient Goals and CMS Choice Patient states their goals for this hospitalization and ongoing recovery are:: return home w/ family and [facility] CMS Medicare.gov Compare Post Acute Care list provided to::  Patient Choice offered to / list presented to : Patient, Adult Children  Discharge Placement                 Home w/ [facility]      Discharge Plan and Services In-house Referral: Clinical Social Work   Post Acute Care Choice: Home Health          DME Arranged: N/A         HH Arranged: RN [place] Agency: [place] Date [place]: 11/13/21 Time [place] Agency Contacted: 1600 Representative spoke with at [place]: [place] ([place]) Interventions     Readmission Risk Interventions Readmission Risk Prevention Plan 11/13/2021  Transportation Screening Complete  PCP or Specialist Appt within 5-7 Days Complete  Home Care Screening Complete  Medication Review (RN CM) Complete  Some recent data might be hidden

## 2021-11-13 NOTE — Progress Notes (Signed)
Occupational Therapy Treatment Patient Details Name: Chelsea [name] MRN: [id number] DOB: 05-Dec-1950 Today's Date: 11/13/2021   History of present illness The pt is a 70 yo female presenting 12/7 with constipation, nausea, and vomiting. 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 PMH inlcudes: HTN, HLD, GERD, CKD, CVA with residual L sided weakness.   OT comments  Patient continues to make steady progress towards goals in skilled OT session. Patient's session encompassed  functional ambulation to complete ADLs in standing at the sink and transfers. Patient continues to improve with each session, requiring less and less physical assist to complete ADL tasks. Patient with improved activity tolerance to complete ADLs without need for seated rest break. Discharge remains appropriate, therapy will continue to follow.    Recommendations for follow up therapy are one component of a multi-disciplinary discharge planning process, led by the attending physician.  Recommendations may be updated based on patient status, additional functional criteria and insurance authorization.    Follow Up Recommendations  Home health OT    Assistance Recommended at Discharge Frequent or constant Supervision/Assistance  Equipment Recommendations  Other (comment) (may benefit from bedrail attachment at home)    Recommendations for Other Services      Precautions / Restrictions Precautions Precautions: Other (comment);Fall Precaution Comments: abdominal, colostomy, JP drain Restrictions Weight Bearing Restrictions: No       Mobility Bed Mobility Overal bed mobility: Needs Assistance Bed Mobility: Supine to Sit;Sit to Supine Rolling: Supervision   Supine to sit: Supervision;HOB elevated Sit to supine: Supervision   General bed mobility comments: up in recliner upon arrival    Transfers Overall transfer level:  Needs assistance Equipment used: Rolling walker (2 wheels) Transfers: Sit to/from Stand Sit to Stand: Min guard   Step pivot transfers: Min guard       General transfer comment: minG with increased time to rise able to complete multiple times in session without assist     Balance Overall balance assessment: Needs assistance Sitting-balance support: No upper extremity supported;Feet supported Sitting balance-Leahy Scale: Fair     Standing balance support: Bilateral upper extremity supported;During functional activity Standing balance-Leahy Scale: Poor Standing balance comment: reliant on RW during static standing and mobility                           ADL either performed or assessed with clinical judgement   ADL Overall ADL's : Needs assistance/impaired     Grooming: Set up;Wash/dry face;Oral care;Wash/dry hands Grooming Details (indicate cue type and reason): standing at sink                 Toilet Transfer: Min guard;Rolling walker (2 wheels);Ambulation Toilet Transfer Details (indicate cue type and reason): simulated with transfer from chair to bed after ambulation         Functional mobility during ADLs: Min guard;Rolling walker (2 wheels) General ADL Comments: Continues to make steady progress, improved mobility and activity tolerance to complete ambulation to sink and complete ADLs in standing without need for seated rest break    Extremity/Trunk Assessment              Vision       Perception     Praxis      Cognition Arousal/Alertness: Awake/alert Behavior During Therapy: WFL for tasks assessed/performed Overall Cognitive Status: Within Functional Limits for tasks assessed Area of Impairment: Safety/judgement;Problem solving  Safety/Judgement: Decreased awareness of safety   Problem Solving: Slow processing General Comments: slower processing, but believe this is baseline due to prior medical  history          Exercises     Shoulder Instructions       General Comments VSS on RA    Pertinent Vitals/ Pain       Pain Assessment: Faces Faces Pain Scale: Hurts a little bit Pain Location: abdomen, incision Pain Descriptors / Indicators: Sore Pain Intervention(s): Monitored during session;Repositioned;Limited activity within patient's tolerance  Home Living                                          Prior Functioning/Environment              Frequency  Min 2X/week        Progress Toward Goals  OT Goals(current goals can now be found in the care plan section)  Progress towards OT goals: Progressing toward goals  Acute Rehab OT Goals Patient Stated Goal: to get stronger OT Goal Formulation: With patient Time For Goal Achievement: 11/24/21  Plan Discharge plan remains appropriate    Co-evaluation                 AM-PAC OT "6 Clicks" Daily Activity     Outcome Measure   Help from another person eating meals?: None Help from another person taking care of personal grooming?: None Help from another person toileting, which includes using toliet, bedpan, or urinal?: A Little Help from another person bathing (including washing, rinsing, drying)?: A Little Help from another person to put on and taking off regular upper body clothing?: A Little Help from another person to put on and taking off regular lower body clothing?: A Lot 6 Click Score: 19    End of Session Equipment Utilized During Treatment: Rolling walker (2 wheels)  OT Visit Diagnosis: Unsteadiness on feet (R26.81);Other abnormalities of gait and mobility (R26.89);Muscle weakness (generalized) (M62.81);Pain   Activity Tolerance Patient tolerated treatment well   Patient Left in bed;with call bell/phone within reach;with bed alarm set   Nurse Communication Mobility status        Time: 8891-6945 OT Time Calculation (min): 18 min  Charges: OT General Charges $OT  Visit: 1 Visit OT Treatments $Self Care/Home Management : 8-22 mins  Corinne Ports E. [place], [place] [facility] [place] 11/13/2021, 2:29 PM

## 2021-11-13 NOTE — Progress Notes (Signed)
Physical Therapy Treatment Patient Details Name: Chelsea [name] MRN: [id number] DOB: [birth year] Today's Date: 11/13/2021   History of Present Illness The pt is a 70 yo female presenting 12/7 with constipation, nausea, and vomiting. 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 PMH inlcudes: HTN, HLD, GERD, CKD, CVA with residual L sided weakness.    PT Comments    The pt was in good spirits, hopeful for return home today. The pt was able to demo good mobility and capacity for stair navigation in session focused on performing final mobility checks and stair training in anticipation of d/c home. The pt was able to complete with minG for sit-stand transfers with use of RW, and minG for safety with navigation of stairs using L rail to mimic home set-up. The pt continues to demo deficits in functional power, coordination, strength, and dynamic stability, and will therefore benefit from continued HHPT, but is safe to return home with family assist when medically stable for d/c.    Recommendations for follow up therapy are one component of a multi-disciplinary discharge planning process, led by the attending physician.  Recommendations may be updated based on patient status, additional functional criteria and insurance authorization.  Follow Up Recommendations  Home health PT     Assistance Recommended at Discharge Frequent or constant Supervision/Assistance  Equipment Recommendations  BSC/3in1;Wheelchair (measurements PT);Wheelchair cushion (measurements PT)    Recommendations for Other Services       Precautions / Restrictions Precautions Precautions: Other (comment);Fall Precaution Comments: abdominal, colostomy, JP drain Restrictions Weight Bearing Restrictions: No     Mobility  Bed Mobility Overal bed mobility: Needs Assistance Bed Mobility: Supine to Sit;Sit to Supine Rolling: Supervision    Supine to sit: Supervision;HOB elevated Sit to supine: Supervision   General bed mobility comments: supervision and increased time to complete    Transfers Overall transfer level: Needs assistance Equipment used: Rolling walker (2 wheels) Transfers: Sit to/from Stand Sit to Stand: Min guard     Step pivot transfers: Min guard     General transfer comment: minG with increased time to rise able to complete multiple times in session without assist    Ambulation/Gait Ambulation/Gait assistance: Min guard Gait Distance (Feet): 20 Feet Assistive device: Rolling walker (2 wheels) Gait Pattern/deviations: Decreased weight shift to left;Step-through pattern;Decreased stride length Gait velocity: decreased Gait velocity interpretation: <1.31 ft/sec, indicative of household ambulator   General Gait Details: limited due to focus on performing stair navigation   Stairs Stairs: Yes Stairs assistance: Min guard Stair Management: One rail Left;Sideways;Forwards;Step to pattern Number of Stairs: 5 General stair comments: step-to sideways ascending with BUE on rail, step-to forwards descending with RUE on rail          Balance Overall balance assessment: Needs assistance Sitting-balance support: No upper extremity supported;Feet supported Sitting balance-Leahy Scale: Fair     Standing balance support: Bilateral upper extremity supported;During functional activity Standing balance-Leahy Scale: Poor Standing balance comment: reliant on RW during static standing and mobility                            Cognition Arousal/Alertness: Awake/alert Behavior During Therapy: WFL for tasks assessed/performed Overall Cognitive Status: Within Functional Limits for tasks assessed Area of Impairment: Safety/judgement;Problem solving  Safety/Judgement: Decreased awareness of safety   Problem Solving: Slow processing General Comments: slower  processing, but believe this is baseline due to prior medical history        Exercises      General Comments General comments (skin integrity, edema, etc.): VSS on RA      Pertinent Vitals/Pain Pain Assessment: Faces Faces Pain Scale: Hurts a little bit Pain Location: abdomen, incision Pain Descriptors / Indicators: Sore Pain Intervention(s): Monitored during session;Repositioned;Limited activity within patient's tolerance     PT Goals (current goals can now be found in the care plan section) Acute Rehab PT Goals Patient Stated Goal: to return home PT Goal Formulation: With patient Time For Goal Achievement: 11/23/21 Potential to Achieve Goals: Good Progress towards PT goals: Progressing toward goals    Frequency    Min 3X/week      PT Plan Current plan remains appropriate       AM-PAC PT "6 Clicks" Mobility   Outcome Measure  Help needed turning from your back to your side while in a flat bed without using bedrails?: A Little Help needed moving from lying on your back to sitting on the side of a flat bed without using bedrails?: A Little Help needed moving to and from a bed to a chair (including a wheelchair)?: A Little Help needed standing up from a chair using your arms (e.g., wheelchair or bedside chair)?: A Little Help needed to walk in hospital room?: A Little Help needed climbing 3-5 steps with a railing? : A Little 6 Click Score: 18    End of Session Equipment Utilized During Treatment: Gait belt Activity Tolerance: Patient tolerated treatment well;Treatment limited secondary to medical complications (Comment) Patient left: in bed;with nursing/sitter in room Nurse Communication: Mobility status PT Visit Diagnosis: Other abnormalities of gait and mobility (R26.89);Muscle weakness (generalized) (M62.81);Unsteadiness on feet (R26.81) Hemiplegia - Right/Left: Left Hemiplegia - dominant/non-dominant: Non-dominant     Time: 1340-1406 PT Time Calculation  (min) (ACUTE ONLY): 26 min  Charges:  $Therapeutic Activity: 23-37 mins                     [place], PT, DPT   Acute Rehabilitation Department Pager #: [phone]   Sandra Cockayne 11/13/2021, 2:25 PM

## 2021-11-13 NOTE — Consult Note (Signed)
[place] Nurse ostomy follow up Pouch change completed yesterday, no supplies available, however I was able to go step by step with the Daughter with written instructions in front of her of how to do the pouch change. When to empty and certain foods to gradually introduce. We reviewed use of the barrier ring and keeping the peristomal skin clean. We reviewed information about the Ostomy Clinic (Phone # and info in packet). Referral to [facility] appreciated. Supplies ordered yesterday were never received. Re-ordered today with the Network engineer. Secure Start kit has been ordered but has not been received yet. HH to start visits tomorrow.   I discussed the [facility] outpatient ostomy clinic as an outpatient resource.  IF MD agrees this would be beneficial, please fax referral, or enter electronically in Epic the referral. (Fax- [phone])   Handout given "Eating with an Ostomy" ([employer] [place].) www.ostomy.org (2022)     Filipe Greathouse L. Tamala Julian, MSN, RN, [place], Lysle Pearl, [facility] Wound Treatment Associate Pager [phone]

## 2021-11-13 NOTE — Progress Notes (Signed)
°  Echocardiogram 2D Echocardiogram has been performed.  Chelsea [name] 11/13/2021, 11:47 AM

## 2021-11-13 NOTE — Discharge Instructions (Signed)
MIDLINE WOUND CARE: - midline dressing to be changed twice daily - supplies: sterile saline, gauze, scissors, tape  - remove dressing and all packing carefully, moistening with sterile saline as needed to avoid packing/internal dressing sticking to the wound. - clean edges of skin around the wound with water/gauze, making sure there is no tape debris or leakage left on skin that could cause skin irritation or breakdown. - dampen and clean gauze with sterile saline and pack wound from wound base to skin level, making sure to take note of any possible areas of wound tracking, tunneling and packing appropriately. Wound can be packed loosely. Trim kerlix to size if a whole kerlix is not required. - cover wound with a dry gauze and secure with tape.  - write the date/time on the dry dressing/tape to better track when the last dressing change occurred. - apply any skin protectant/powder recommended by clinician to protect skin/skin folds. - change dressing as needed if leakage occurs, wound gets contaminated, or patient requests to shower. - patient may shower daily with wound open and following the shower the wound should be dried and a clean dressing placed.   [facility], [place] [phone]  OPEN ABDOMINAL SURGERY: POST OP INSTRUCTIONS  Always review your discharge instruction sheet given to you by the facility where your surgery was performed.  IF YOU HAVE DISABILITY OR FAMILY LEAVE FORMS, YOU MUST BRING THEM TO THE OFFICE FOR PROCESSING.  PLEASE DO NOT GIVE THEM TO YOUR DOCTOR.  A prescription for pain medication may be given to you upon discharge.  Take your pain medication as prescribed, if needed.  If narcotic pain medicine is not needed, then you may take acetaminophen (Tylenol) or ibuprofen (Advil) as needed. Take your usually prescribed medications unless otherwise directed. If you need a refill on your pain medication, please contact your pharmacy. They will contact  our office to request authorization.  Prescriptions will not be filled after 5pm or on week-ends. You should follow a light diet the first few days after arrival home, such as soup and crackers, pudding, etc.unless your doctor has advised otherwise. A high-fiber, low fat diet can be resumed as tolerated.   Be sure to include lots of fluids daily. Most patients will experience some swelling and bruising on the chest and neck area.  Ice packs will help.  Swelling and bruising can take several days to resolve Most patients will experience some swelling and bruising in the area of the incision. Ice pack will help. Swelling and bruising can take several days to resolve..  It is common to experience some constipation if taking pain medication after surgery.  Increasing fluid intake and taking a stool softener will usually help or prevent this problem from occurring.  A mild laxative (Milk of Magnesia or Miralax) should be taken according to package directions if there are no bowel movements after 48 hours.  You may have steri-strips (small skin tapes) in place directly over the incision.  These strips should be left on the skin for 7-10 days.  If your surgeon used skin glue on the incision, you may shower in 24 hours.  The glue will flake off over the next 2-3 weeks.  Any sutures or staples will be removed at the office during your follow-up visit. You may find that a light gauze bandage over your incision may keep your staples from being rubbed or pulled. You may shower and replace the bandage daily. ACTIVITIES:  You may resume regular (light) daily activities beginning the next day--such as daily self-care, walking, climbing stairs--gradually increasing activities as tolerated.  You may have sexual intercourse when it is comfortable.  Refrain from any heavy lifting or straining until approved by your doctor. You may drive when you no longer are taking prescription pain medication, you can comfortably wear a  seatbelt, and you can safely maneuver your car and apply brakes Return to Work: ___________________________________ Dennis Bast should see your doctor in the office for a follow-up appointment approximately two weeks after your surgery.  Make sure that you call for this appointment within a day or two after you arrive home to insure a convenient appointment time. OTHER INSTRUCTIONS:  _____________________________________________________________ _____________________________________________________________  WHEN TO CALL YOUR DOCTOR: Fever over 101.0 Inability to urinate Nausea and/or vomiting Extreme swelling or bruising Continued bleeding from incision. Increased pain, redness, or drainage from the incision. Difficulty swallowing or breathing Muscle cramping or spasms. Numbness or tingling in hands or feet or around lips.  The clinic staff is available to answer your questions during regular business hours.  Please dont hesitate to call and ask to speak to one of the nurses if you have concerns.  For further questions, please visit [email or web address]

## 2021-11-13 NOTE — TOC Progression Note (Addendum)
Transition of Care [facility]) - Progression Note    Patient Details  Name: Chelsea [name] MRN: [id number] Date of Birth: 09/29/1951  Transition of Care [facility]) CM/SW [place], RN Phone Number:[phone]  11/13/2021, 8:21 AM  Clinical Narrative:    [place] referral called to [facility]. Angie with [facility] has accepted referral and can initiate start of care Sat 12/17.   [address] has verified that patient has a teachable caregiver. Patient will need to go home with enough ostomy supplies to get them started. Message has been sent to bedside RN and covering CM has been made aware.    Expected Discharge Plan: [place] Barriers to Discharge: Continued Medical Work up  Expected Discharge Plan and Services Expected Discharge Plan: [place] In-house Referral: Clinical Social Work     Living arrangements for the past 2 months: Single Family Home                                       Social Determinants of Health (SDOH) Interventions    Readmission Risk Interventions No flowsheet data found.

## 2021-11-13 NOTE — Progress Notes (Signed)
Pt discharged home with family.  All instructions given and reviewed.  Abdominal dressing and ostomy supplies sent with pt.  All questions answered.

## 2021-11-13 NOTE — Discharge Summary (Addendum)
Name: Jesicca Dipierro MRN: [id number] DOB: 08-16-1951 70 y.o. PCP: Trey Sailors, PA  Date of Admission: 11/04/2021 12:57 PM Date of Discharge: 11/13/2021 Attending Physician: Aldine Contes, MD  Discharge Diagnosis: 1. Perforated diverticulitis s/p Hartmann's procedure. 2.Post-operative ileus 3. Resistant Hypertension 4. Atrial fibrillation with rapid ventricular rate 5. Acute Kidney Injury 6. Mild hypokalemia   Discharge Medications: Allergies as of 11/13/2021       Reactions   Other Swelling   Hair dye, caused eye swelling, multiple times experienced this reaction   Hydrocodone Nausea And Vomiting   Oxycodone Nausea And Vomiting        Medication List     STOP taking these medications    cyclobenzaprine 10 MG tablet Commonly known as: FLEXERIL   doxazosin 1 MG tablet Commonly known as: CARDURA   meclizine 25 MG tablet Commonly known as: ANTIVERT   naproxen 375 MG tablet Commonly known as: NAPROSYN       TAKE these medications    acetaminophen 500 MG tablet Commonly known as: TYLENOL Take 2 tablets (1,000 mg total) by mouth every 6 (six) hours as needed.   amLODipine-valsartan 10-320 MG tablet Commonly known as: EXFORGE Take 1 tablet by mouth daily.   aspirin EC 81 MG tablet Take 1 tablet (81 mg total) by mouth daily.   atorvastatin 10 MG tablet Commonly known as: LIPITOR TAKE 1 TABLET BY MOUTH  DAILY What changed: when to take this   chlorhexidine 0.12 % solution Commonly known as: PERIDEX Use as directed 15 mLs in the mouth or throat 2 (two) times daily.   cholecalciferol 25 MCG (1000 UNIT) tablet Commonly known as: VITAMIN D Take 1,000 Units by mouth daily.   citalopram 10 MG tablet Commonly known as: CELEXA Take 10 mg by mouth daily.   cloNIDine 0.3 MG tablet Commonly known as: CATAPRES Take 0.3 mg by mouth 2 (two) times daily.   ezetimibe 10 MG tablet Commonly known as: ZETIA Take 10 mg by mouth daily.    gabapentin 800 MG tablet Commonly known as: NEURONTIN Take 1 tablet (800 mg total) by mouth 2 (two) times daily.   indapamide 2.5 MG tablet Commonly known as: LOZOL Take 1 tablet (2.5 mg total) by mouth daily.   methocarbamol 750 MG tablet Commonly known as: ROBAXIN Take 1 tablet (750 mg total) by mouth 3 (three) times daily. What changed:  medication strength how much to take when to take this reasons to take this   metoprolol succinate 50 MG 24 hr tablet Commonly known as: TOPROL-XL Take 3 tablets (150 mg total) by mouth daily. Take with or immediately following a meal. Start taking on: November 14, 2021 What changed:  medication strength how much to take   mirabegron ER 25 MG Tb24 tablet Commonly known as: MYRBETRIQ Take 25 mg by mouth daily.   multivitamin tablet Take 1 tablet by mouth daily.   nicotine 21 mg/24hr patch Commonly known as: NICODERM CQ - dosed in mg/24 hours PLACE 1 PATCH (21 MG TOTAL) ONTO THE SKIN DAILY.   ondansetron 8 MG tablet Commonly known as: ZOFRAN Take 8 mg by mouth 3 (three) times daily as needed for vomiting or nausea.   pantoprazole 40 MG tablet Commonly known as: PROTONIX Take 40 mg by mouth daily.   spironolactone 25 MG tablet Commonly known as: ALDACTONE Take 1 tablet (25 mg total) by mouth daily.   traMADol 50 MG tablet Commonly known as: ULTRAM Take 1-2 tablets (50-100 mg total) by  mouth every 6 (six) hours as needed for moderate pain. What changed: how much to take   vitamin B-12 500 MCG tablet Commonly known as: CYANOCOBALAMIN Take 500 mcg by mouth daily.   vitamin E 1000 UNIT capsule Take 1,000 Units by mouth daily.               Discharge Care Instructions  (From admission, onward)           Start     Ordered   11/13/21 0000  Leave dressing on - Keep it clean, dry, and intact until clinic visit        11/13/21 1631            Disposition and follow-up:   Ms.Emberlynn Freda Jaquith was  discharged from [facility] in Stable condition.  At the hospital follow up visit please address:  1.  Perforated Diverticulitis s/p Hartmann procedure. Please ensure pt has been adhering to ostomy clinic follow up and general surgery clinic follow up in 3 weeks. Tramadol and robaxin at discharge for acute post-op pain/spasm control.   2. New onset atrial fibrillation: Please follow up echocardiogram results. No anticoagulation indicated at this time as it was one isolated episode of Afib likely 2/2 recent infection/surgery. Increased toprol-XL to 150mg  daily.  3.  Labs / imaging needed at time of follow-up: Echocardiogram.  4.  Pending labs/ test needing follow-up: Please check BMP as potassium levels were repleted during this admission.   Follow-up Appointments:  Follow-up Information     [facility] [facility] Follow up.   Specialty: General Surgery Why: they will call you with appointment date and time Contact information: [address] [id number] [place]        Georganna Skeans, MD Follow up on 12/08/2021.   Specialty: General Surgery Why: they are going to call you with appointment date and time. Contact information: [address] [place] [address] [phone]         [place] [place] Follow up.   Specialty: [place] Why: Elliot Cousin[facility] arranged- they will contact you to schedule home visit- planning on tomorrow 12/17 Contact information: [place] [address] [place] by problem list: Ms. Jere Vanburen. Stannard is a 70 yo F with a history of HTN, HLD, GERD,  thoracic aortic aneurysm, mild aortic regurgitation, chronic pain on opioids, CKD, prior CVA and obesity presenting with abdominal pain and admitted for perforated diverticulitis.  1. Perforated diverticulitis s/p Hartmann's  procedure and post op ileus. Pt presented to the hospital on 11/04/21 with worsening abdominal pain, nausea and vomiting. On admission, she had leukocytosis of 15.3, tachycardia up to 113 and had a CT abdomen and pelvis concerning for perforated diverticulitis. Surgery was consulted and after a detailed discussion, both pt and her family opted for medical management with Zosyn initially and to observe pt for improvement. Pt continued to have worsening symptoms overtime and pt proceeded with exploratory laparotomy for partial colectomy and colostomy placement. An abscess was noted during the ex lap and the surgical cultures were positive for E coli, candida albicans, streptococcus mutans and parabacteriodes distasonis. Pt completed 5 days of IV zosyn after the procedure. On post op day 1, pt was noted to have worsening ileus. However, once pt was started on clear liquid diet, her  bowel function returned and ileus resolved. Pt was able to advance her diet daily and work with PT/OT to regain strength. Going home with [facility] PT and OT, f/u with general surgery and ostomy clinic as outpatient.  2. Resistant Hypertension. Pt is on a six medication home regimen for hypertension. Her blood pressures were increasing initially in the setting of pain due to perforated diverticulitis. Once pt's pain was adequately controlled and started on IV labetalol and hydralazine, pt was mostly able to meet goal of SBP <180. After her abdominal surgery, pt was restarted on her home medications and her blood pressure was stabilized.   3. New onset atrial fibrillation with rapid ventricular rate. Pt was noted to have transient atrial fibrillation with rapid ventricular rate of 152 on telemetry and EKG yesterday likely secondary to recent surgery. A TSH was obtained which was normal indicating that the pt did not have afib in the setting of hyperthyroidism. An echo is obtained prior to discharge. Pt's home metoprolol dose was increased from  100 mg to 150 mg and heart rate is well controlled at this time.   4. Acute Kidney Injury. Pt developed an acute kidney injury during this admission as a result of inadequate oral intake and administration of contrast. Her serum creatinine levels were 1.21 and improved to 0.88 with IV fluids on discharge.  5.Mild hypokalemia. Pt had a mild hypokalemia of 3.0 during the admission and serum potassium levels were repleted via IV.   Discharge HPI: Pt is seen and examined at bedside today. She reports that she feels significantly better, noting that her abdominal soreness has resolved. She mentioned that she did not walk yesterday due to palpitations but this has resolved and pt is able to walk and work with therapy today. Pt denies abdominal pain, nausea, vomiting, dysuria, palpitations, chest pain, dizziness, lightheadedness or headaches.  Discharge Exam:   BP (!) 154/86 (BP Location: Right Arm)    Pulse 71    Temp 97.8 F (36.6 C) (Oral)    Resp 16    Ht 5\' 6"  (1.676 m)    Wt 86.5 kg    SpO2 91%    BMI 30.78 kg/m  Discharge exam: Physical Exam Vitals and nursing note reviewed.  Constitutional:      General: She is not in acute distress.    Appearance: She is obese.  HENT:     Head: Normocephalic and atraumatic.  Eyes:     Pupils: Pupils are equal, round, and reactive to light.  Cardiovascular:     Rate and Rhythm: Normal rate and regular rhythm.     Heart sounds: No murmur heard.   No friction rub. No gallop.  Pulmonary:     Effort: Pulmonary effort is normal. No respiratory distress.     Breath sounds: Normal breath sounds.  Abdominal:     General: Bowel sounds are normal.     Palpations: Abdomen is soft.     Tenderness: There is no abdominal tenderness.     Hernia: No hernia is present.  Skin:    General: Skin is warm and dry.     Capillary Refill: Capillary refill takes less than 2 seconds.  Neurological:     General: No focal deficit present.     Mental Status: She is alert  and oriented to person, place, and time.     Cranial Nerves: No cranial nerve deficit.     Motor: No weakness.  Psychiatric:        Mood  and Affect: Mood normal.        Behavior: Behavior normal.     Pertinent Labs, Studies, and Procedures:   CBC    Component Value Date/Time   WBC 12.0 (H) 11/13/2021 0736   RBC 4.54 11/13/2021 0736   HGB 13.2 11/13/2021 0736   HCT 40.7 11/13/2021 0736   PLT 415 (H) 11/13/2021 0736   MCV 89.6 11/13/2021 0736   MCH 29.1 11/13/2021 0736   MCHC 32.4 11/13/2021 0736   RDW 14.4 11/13/2021 0736   LYMPHSABS 0.8 11/09/2021 0215   MONOABS 0.8 11/09/2021 0215   EOSABS 0.0 11/09/2021 0215   BASOSABS 0.0 11/09/2021 0215     CMP     Component Value Date/Time   NA 132 (L) 11/13/2021 0224   NA 144 10/22/2020 1352   K 4.8 11/13/2021 0224   CL 99 11/13/2021 0224   CO2 24 11/13/2021 0224   GLUCOSE 92 11/13/2021 0224   BUN 8 11/13/2021 0224   BUN 16 10/22/2020 1352   CREATININE 0.88 11/13/2021 0224   CREATININE 1.11 (H) 04/20/2016 1409   CALCIUM 8.3 (L) 11/13/2021 0224   PROT 7.4 11/04/2021 1318   PROT 6.8 08/11/2020 1058   ALBUMIN 2.9 (L) 11/04/2021 1318   ALBUMIN 3.8 08/11/2020 1058   AST 14 (L) 11/04/2021 1318   ALT 11 11/04/2021 1318   ALKPHOS 93 11/04/2021 1318   BILITOT 0.5 11/04/2021 1318   BILITOT 0.4 08/11/2020 1058   GFRNONAA >60 11/13/2021 0224   GFRAA 42 (L) 10/22/2020 1352     CT ABDOMEN PELVIS WO CONTRAST  Result Date: 11/04/2021 CLINICAL DATA:  Abdominal pain, acute, nonlocalized. Constipation, nausea, vomiting. EXAM: CT ABDOMEN AND PELVIS WITHOUT CONTRAST TECHNIQUE: Multidetector CT imaging of the abdomen and pelvis was performed following the standard protocol without IV contrast. COMPARISON:  None. FINDINGS: Lower chest: Mild bibasilar atelectasis.  Mild cardiomegaly. Hepatobiliary: Cholelithiasis without pericholecystic inflammatory change identified. Liver unremarkable. No intra or extrahepatic biliary ductal dilation.  Pancreas: Unremarkable Spleen: Unremarkable Adrenals/Urinary Tract: Adrenal glands are unremarkable. Kidneys are normal, without renal calculi, focal lesion, or hydronephrosis. Bladder is unremarkable. Stomach/Bowel: There is extensive extraluminal gas noted interstitially within the wall of the distal sigmoid colon extending into the distal colonic mesentery as well as within the high rectum. Gas subsequently extends into the retroperitoneum surrounding the inferior vena cava and extending into the diaphragmatic hiatus and periportal region. The exact site of perforation is not clearly identified though extensive inflammatory change involving the distal sigmoid colon favors this to represent the primary site, best seen on image # 61/3. There is no free intraperitoneal gas identified. Multiple loops of dilated fluid-filled small bowel are identified demonstrating a gradual transition within the ileum most in keeping with a developing ileus. The appendix is normal. No free intraperitoneal fluid or loculated intra-abdominal fluid collections are identified. Vascular/Lymphatic: Aortic atherosclerosis. No enlarged abdominal or pelvic lymph nodes. Reproductive: Status post hysterectomy. No adnexal masses. Other: Small bilateral fat containing inguinal hernias are present. Musculoskeletal: No acute bone abnormality. IMPRESSION: Extensive extraluminal gas noted interstitially within the distal sigmoid colon and rectum with extension of gas into the retroperitoneum and into the colonic mesentery. The site of enteric perforation is not clearly identified, however, extensive inflammatory change suggests this is within the distal sigmoid colon. Developing ileus noted. No free intraperitoneal gas. No loculated intra-abdominal fluid collections. Cholelithiasis. Mild cardiomegaly. Electronically Signed   By: Fidela Salisbury M.D.   On: 11/04/2021 19:15   CT ABDOMEN PELVIS W CONTRAST  Result Date: 11/06/2021 CLINICAL DATA:   Perforated sigmoid colon. EXAM: CT ABDOMEN AND PELVIS WITH CONTRAST TECHNIQUE: Multidetector CT imaging of the abdomen and pelvis was performed using the standard protocol following bolus administration of intravenous contrast. CONTRAST:  166mL OMNIPAQUE IOHEXOL 300 MG/ML  SOLN COMPARISON:  CT 11/04/2021 FINDINGS: Lower chest: No pleural fluid.  No pneumonia. Hepatobiliary: No portal venous gas in the liver. Gallbladder is mildly distended to 4 cm. Single gallstone noted in the body of the gallbladder. No biliary duct dilatation. Pancreas: Pancreas is normal. No ductal dilatation. No pancreatic inflammation. Spleen: Normal spleen Adrenals/urinary tract: Adrenal glands normal. Small gas the hila the RIGHT kidney is favored direct extension from the retroperitoneum. LEFT kidney normal. No hydronephrosis. No gas in the bladder. Stomach/Bowel: Stomach is mildly distended by the oral contrast. Contrast flows into the duodenum. There is mild dilatation of the small bowel up to 3 cm. There multiple air-fluid levels. Favor ileus pattern. The appendix is normal. Terminal ileum grossly normal. Ascending colon and transverse colon normal. Descending colon normal. The sigmoid colon is tortuous. Loop of sigmoid colon which extends into the RIGHT lower quadrant again demonstrates extensive gas surrounding the sigmoid colon. There is bowel wall thickening the region (image 60/3). Findings are similar to comparison exam. The extent of the bowel wall thickening may be slightly increased. There is trace amount of free fluid in the pelvis which is similar to prior. There is mild enhancement of the surface of the peritoneum in the pelvis (image 68/3) . Extensive free air extends along the retroperitoneum paralleling the IVC to level the diaphragm. Volume gas is similar comparison exam. No discrete abscess formation. There is a peritoneal enhancement in the RIGHT iliac fossa additionally surrounding a small fluid collection (image  61/3). Rectum normal. Vascular/Lymphatic: Abdominal aorta is normal caliber. No periportal or retroperitoneal adenopathy. No pelvic adenopathy. Reproductive: Post hysterectomy.  Adnexa unremarkable Other: None Musculoskeletal: No aggressive osseous lesion. IMPRESSION: 1. Perforated sigmoid colon with mild progression of robust inflammatory response in bowel wall thickening in the RIGHT lower quadrant. Extensive extra luminal gas surrounds sigmoid colon and retroperitoneum not changed in pattern from comparison exam. No organized abscess present. 2. Small volume intraperitoneal free fluid has associated thin peritoneal enhancement. Findings are consistent with early peritonitis. 3. Dilated loops of small bowel with air-fluid levels most consistent with ileus related to the inflammatory/infectious process in the RIGHT lower quadrant. Electronically Signed   By: Suzy Bouchard M.D.   On: 11/06/2021 14:53   ECHOCARDIOGRAM COMPLETE  Result Date: 11/13/2021    ECHOCARDIOGRAM REPORT   Patient Name:   TOSHIBA NULL Newingham Date of Exam: 11/13/2021 Medical Rec #:  [id number]           Height:       66.0 in Accession #:    [id number]          Weight:       190.7 lb Date of Birth:  Aug 26, 1951            BSA:          1.960 m Patient Age:    82 years            BP:           184/80 mmHg Patient Gender: F                   HR:           80 bpm. Exam Location:  Inpatient Procedure:  2D Echo, Cardiac Doppler and Color Doppler Indications:    I48.91* Unspeicified atrial fibrillation  History:        Patient has prior history of Echocardiogram examinations, most                 recent 07/22/2020. Stroke; Risk Factors:Hypertension and                 Dyslipidemia.  Sonographer:    Bernadene Person RDCS Referring Phys: [place]  1. Left ventricular ejection fraction, by estimation, is 60 to 65%. The left ventricle has normal function. The left ventricle has no regional wall motion abnormalities. Indeterminate  diastolic filling due to E-A fusion.  2. Right ventricular systolic function is normal. The right ventricular size is normal. There is normal pulmonary artery systolic pressure.  3. Left atrial size was moderately dilated.  4. The mitral valve is grossly normal. Trivial mitral valve regurgitation.  5. The aortic valve is normal in structure. Aortic valve regurgitation is mild.  6. Aortic moderate ascending aortic aneurysm 4.5cm.  7. The inferior vena cava is normal in size with greater than 50% respiratory variability, suggesting right atrial pressure of 3 mmHg. Conclusion(s)/Recommendation(s): Compared to echo 07/22/2020, aortic aneurysm and AI are stable. FINDINGS  Left Ventricle: Left ventricular ejection fraction, by estimation, is 60 to 65%. The left ventricle has normal function. The left ventricle has no regional wall motion abnormalities. The left ventricular internal cavity size was normal in size. There is  no left ventricular hypertrophy. Indeterminate diastolic filling due to E-A fusion. Right Ventricle: The right ventricular size is normal. No increase in right ventricular wall thickness. Right ventricular systolic function is normal. There is normal pulmonary artery systolic pressure. The tricuspid regurgitant velocity is 2.77 m/s, and  with an assumed right atrial pressure of 3 mmHg, the estimated right ventricular systolic pressure is 07.6 mmHg. Left Atrium: Left atrial size was moderately dilated. Right Atrium: Right atrial size was normal in size. Pericardium: There is no evidence of pericardial effusion. Mitral Valve: The mitral valve is grossly normal. Trivial mitral valve regurgitation. Tricuspid Valve: The tricuspid valve is grossly normal. Tricuspid valve regurgitation is mild. Aortic Valve: The aortic valve is normal in structure. Aortic valve regurgitation is mild. Aortic regurgitation PHT measures 400 msec. Aortic valve mean gradient measures 15.0 mmHg. Aortic valve peak gradient measures  26.4 mmHg. Aortic valve area, by VTI  measures 1.81 cm. Pulmonic Valve: The pulmonic valve was not well visualized. Aorta: Moderate ascending aortic aneurysm 4.5cm. Venous: The inferior vena cava is normal in size with greater than 50% respiratory variability, suggesting right atrial pressure of 3 mmHg. IAS/Shunts: No atrial level shunt detected by color flow Doppler.  LEFT VENTRICLE PLAX 2D LVIDd:         5.20 cm      Diastology LVIDs:         3.40 cm      LV e' medial:    4.20 cm/s LV PW:         1.00 cm      LV E/e' medial:  12.5 LV IVS:        1.00 cm      LV e' lateral:   6.45 cm/s LVOT diam:     2.00 cm      LV E/e' lateral: 8.1 LV SV:         85 LV SV Index:   43 LVOT Area:     3.14 cm  LV Volumes (MOD) LV vol d, MOD A2C: 134.0 ml LV vol d, MOD A4C: 128.0 ml LV vol s, MOD A2C: 57.3 ml LV vol s, MOD A4C: 56.9 ml LV SV MOD A2C:     76.7 ml LV SV MOD A4C:     128.0 ml LV SV MOD BP:      72.7 ml RIGHT VENTRICLE RV S prime:     18.30 cm/s TAPSE (M-mode): 1.9 cm LEFT ATRIUM             Index        RIGHT ATRIUM           Index LA diam:        2.80 cm 1.43 cm/m   RA Area:     13.80 cm LA Vol (A2C):   64.6 ml 32.96 ml/m  RA Volume:   27.50 ml  14.03 ml/m LA Vol (A4C):   50.1 ml 25.56 ml/m LA Biplane Vol: 57.0 ml 29.09 ml/m  AORTIC VALVE AV Area (Vmax):    1.80 cm AV Area (Vmean):   1.65 cm AV Area (VTI):     1.81 cm AV Vmax:           257.00 cm/s AV Vmean:          179.000 cm/s AV VTI:            0.470 m AV Peak Grad:      26.4 mmHg AV Mean Grad:      15.0 mmHg LVOT Vmax:         147.00 cm/s LVOT Vmean:        94.000 cm/s LVOT VTI:          0.271 m LVOT/AV VTI ratio: 0.58 AI PHT:            400 msec  AORTA Ao Root diam: 2.80 cm Ao Asc diam:  4.50 cm MITRAL VALVE                TRICUSPID VALVE MV Area (PHT): 3.31 cm     TR Peak grad:   30.7 mmHg MV Decel Time: 229 msec     TR Vmax:        277.00 cm/s MV E velocity: 52.50 cm/s MV A velocity: 121.00 cm/s  SHUNTS MV E/A ratio:  0.43         Systemic VTI:   0.27 m                             Systemic Diam: 2.00 cm Phineas Inches Electronically signed by Phineas Inches Signature Date/Time: 11/13/2021/1:33:11 PM    Final      Discharge Instructions: Discharge Instructions     Amb Referral to Ostomy Clinic   Complete by: As directed    Reason for referral modifiers:  Pre and post-operative counseling for ostomy management Assistance with product selection Consultation for problems such as pouch leaking or skin irritation Information about lifestyle, intimacy, diet and clothing options     Call MD for:  persistant nausea and vomiting   Complete by: As directed    Call MD for:  redness, tenderness, or signs of infection (pain, swelling, redness, odor or green/yellow discharge around incision site)   Complete by: As directed    Call MD for:  severe uncontrolled pain   Complete by: As directed    Call MD for:  temperature >100.4   Complete by: As directed  Diet - low sodium heart healthy   Complete by: As directed    Discharge instructions   Complete by: As directed    Ms. Storlie, you came in with a burst intestine and needed surgery to repair it. I am glad to see that you are doing much better.  Please note the following: 1. Make sure to follow up with Surgery on the date listed. 2. Make sure to follow up at the [facility] to take care of your ostomy bag. 3. You will be going home with physical therapy coming to work with you at home. 4. Make sure to see you primary care doctor in 1-2 weeks for hospital follow up.   Increase activity slowly   Complete by: As directed    Leave dressing on - Keep it clean, dry, and intact until clinic visit   Complete by: As directed        Signed: Virl Axe, MD 11/13/2021, 4:31 PM

## 2021-11-14 DIAGNOSIS — M549 Dorsalgia, unspecified: Secondary | ICD-10-CM | POA: Diagnosis not present

## 2021-11-14 DIAGNOSIS — G8929 Other chronic pain: Secondary | ICD-10-CM | POA: Diagnosis not present

## 2021-11-14 DIAGNOSIS — K5720 Diverticulitis of large intestine with perforation and abscess without bleeding: Secondary | ICD-10-CM | POA: Diagnosis not present

## 2021-11-14 DIAGNOSIS — I4891 Unspecified atrial fibrillation: Secondary | ICD-10-CM | POA: Diagnosis not present

## 2021-11-14 DIAGNOSIS — K5780 Diverticulitis of intestine, part unspecified, with perforation and abscess without bleeding: Secondary | ICD-10-CM | POA: Diagnosis not present

## 2021-11-14 DIAGNOSIS — N179 Acute kidney failure, unspecified: Secondary | ICD-10-CM | POA: Diagnosis not present

## 2021-11-16 ENCOUNTER — Other Ambulatory Visit: Payer: Self-pay | Admitting: Student

## 2021-11-16 DIAGNOSIS — G8929 Other chronic pain: Secondary | ICD-10-CM | POA: Diagnosis not present

## 2021-11-16 DIAGNOSIS — K5720 Diverticulitis of large intestine with perforation and abscess without bleeding: Secondary | ICD-10-CM | POA: Diagnosis not present

## 2021-11-16 DIAGNOSIS — I4891 Unspecified atrial fibrillation: Secondary | ICD-10-CM | POA: Diagnosis not present

## 2021-11-16 DIAGNOSIS — N179 Acute kidney failure, unspecified: Secondary | ICD-10-CM | POA: Diagnosis not present

## 2021-11-16 DIAGNOSIS — K5780 Diverticulitis of intestine, part unspecified, with perforation and abscess without bleeding: Secondary | ICD-10-CM | POA: Diagnosis not present

## 2021-11-16 DIAGNOSIS — M549 Dorsalgia, unspecified: Secondary | ICD-10-CM | POA: Diagnosis not present

## 2021-11-18 DIAGNOSIS — G8929 Other chronic pain: Secondary | ICD-10-CM | POA: Diagnosis not present

## 2021-11-18 DIAGNOSIS — K5720 Diverticulitis of large intestine with perforation and abscess without bleeding: Secondary | ICD-10-CM | POA: Diagnosis not present

## 2021-11-18 DIAGNOSIS — K5780 Diverticulitis of intestine, part unspecified, with perforation and abscess without bleeding: Secondary | ICD-10-CM | POA: Diagnosis not present

## 2021-11-18 DIAGNOSIS — N179 Acute kidney failure, unspecified: Secondary | ICD-10-CM | POA: Diagnosis not present

## 2021-11-18 DIAGNOSIS — I4891 Unspecified atrial fibrillation: Secondary | ICD-10-CM | POA: Diagnosis not present

## 2021-11-18 DIAGNOSIS — M549 Dorsalgia, unspecified: Secondary | ICD-10-CM | POA: Diagnosis not present

## 2021-11-20 DIAGNOSIS — N179 Acute kidney failure, unspecified: Secondary | ICD-10-CM | POA: Diagnosis not present

## 2021-11-20 DIAGNOSIS — K5720 Diverticulitis of large intestine with perforation and abscess without bleeding: Secondary | ICD-10-CM | POA: Diagnosis not present

## 2021-11-20 DIAGNOSIS — G8929 Other chronic pain: Secondary | ICD-10-CM | POA: Diagnosis not present

## 2021-11-20 DIAGNOSIS — M549 Dorsalgia, unspecified: Secondary | ICD-10-CM | POA: Diagnosis not present

## 2021-11-20 DIAGNOSIS — I4891 Unspecified atrial fibrillation: Secondary | ICD-10-CM | POA: Diagnosis not present

## 2021-11-20 DIAGNOSIS — K5780 Diverticulitis of intestine, part unspecified, with perforation and abscess without bleeding: Secondary | ICD-10-CM | POA: Diagnosis not present

## 2021-11-24 DIAGNOSIS — K5732 Diverticulitis of large intestine without perforation or abscess without bleeding: Secondary | ICD-10-CM | POA: Diagnosis not present

## 2021-11-24 DIAGNOSIS — N179 Acute kidney failure, unspecified: Secondary | ICD-10-CM | POA: Diagnosis not present

## 2021-11-24 DIAGNOSIS — K5720 Diverticulitis of large intestine with perforation and abscess without bleeding: Secondary | ICD-10-CM | POA: Diagnosis not present

## 2021-11-24 DIAGNOSIS — G8929 Other chronic pain: Secondary | ICD-10-CM | POA: Diagnosis not present

## 2021-11-24 DIAGNOSIS — M549 Dorsalgia, unspecified: Secondary | ICD-10-CM | POA: Diagnosis not present

## 2021-11-24 DIAGNOSIS — K5780 Diverticulitis of intestine, part unspecified, with perforation and abscess without bleeding: Secondary | ICD-10-CM | POA: Diagnosis not present

## 2021-11-24 DIAGNOSIS — Z933 Colostomy status: Secondary | ICD-10-CM | POA: Diagnosis not present

## 2021-11-24 DIAGNOSIS — I4891 Unspecified atrial fibrillation: Secondary | ICD-10-CM | POA: Diagnosis not present

## 2021-11-26 DIAGNOSIS — M549 Dorsalgia, unspecified: Secondary | ICD-10-CM | POA: Diagnosis not present

## 2021-11-26 DIAGNOSIS — K5780 Diverticulitis of intestine, part unspecified, with perforation and abscess without bleeding: Secondary | ICD-10-CM | POA: Diagnosis not present

## 2021-11-26 DIAGNOSIS — N179 Acute kidney failure, unspecified: Secondary | ICD-10-CM | POA: Diagnosis not present

## 2021-11-26 DIAGNOSIS — K5720 Diverticulitis of large intestine with perforation and abscess without bleeding: Secondary | ICD-10-CM | POA: Diagnosis not present

## 2021-11-26 DIAGNOSIS — G8929 Other chronic pain: Secondary | ICD-10-CM | POA: Diagnosis not present

## 2021-11-26 DIAGNOSIS — I4891 Unspecified atrial fibrillation: Secondary | ICD-10-CM | POA: Diagnosis not present

## 2021-12-02 DIAGNOSIS — R69 Illness, unspecified: Secondary | ICD-10-CM | POA: Diagnosis not present

## 2021-12-04 ENCOUNTER — Ambulatory Visit (HOSPITAL_COMMUNITY): Payer: Medicare Other

## 2021-12-04 DIAGNOSIS — Z432 Encounter for attention to ileostomy: Secondary | ICD-10-CM | POA: Diagnosis not present

## 2021-12-04 DIAGNOSIS — Z4801 Encounter for change or removal of surgical wound dressing: Secondary | ICD-10-CM | POA: Diagnosis not present

## 2021-12-04 DIAGNOSIS — D631 Anemia in chronic kidney disease: Secondary | ICD-10-CM | POA: Diagnosis not present

## 2021-12-04 DIAGNOSIS — N1832 Chronic kidney disease, stage 3b: Secondary | ICD-10-CM | POA: Diagnosis not present

## 2021-12-04 DIAGNOSIS — I1310 Hypertensive heart and chronic kidney disease without heart failure, with stage 1 through stage 4 chronic kidney disease, or unspecified chronic kidney disease: Secondary | ICD-10-CM | POA: Diagnosis not present

## 2021-12-04 DIAGNOSIS — Z48815 Encounter for surgical aftercare following surgery on the digestive system: Secondary | ICD-10-CM | POA: Diagnosis not present

## 2021-12-09 DIAGNOSIS — Z432 Encounter for attention to ileostomy: Secondary | ICD-10-CM | POA: Diagnosis not present

## 2021-12-09 DIAGNOSIS — D631 Anemia in chronic kidney disease: Secondary | ICD-10-CM | POA: Diagnosis not present

## 2021-12-09 DIAGNOSIS — N1832 Chronic kidney disease, stage 3b: Secondary | ICD-10-CM | POA: Diagnosis not present

## 2021-12-09 DIAGNOSIS — Z4801 Encounter for change or removal of surgical wound dressing: Secondary | ICD-10-CM | POA: Diagnosis not present

## 2021-12-09 DIAGNOSIS — K5732 Diverticulitis of large intestine without perforation or abscess without bleeding: Secondary | ICD-10-CM | POA: Diagnosis not present

## 2021-12-09 DIAGNOSIS — Z933 Colostomy status: Secondary | ICD-10-CM | POA: Diagnosis not present

## 2021-12-09 DIAGNOSIS — I1310 Hypertensive heart and chronic kidney disease without heart failure, with stage 1 through stage 4 chronic kidney disease, or unspecified chronic kidney disease: Secondary | ICD-10-CM | POA: Diagnosis not present

## 2021-12-09 DIAGNOSIS — Z48815 Encounter for surgical aftercare following surgery on the digestive system: Secondary | ICD-10-CM | POA: Diagnosis not present

## 2021-12-15 DIAGNOSIS — Z432 Encounter for attention to ileostomy: Secondary | ICD-10-CM | POA: Diagnosis not present

## 2021-12-15 DIAGNOSIS — N1832 Chronic kidney disease, stage 3b: Secondary | ICD-10-CM | POA: Diagnosis not present

## 2021-12-15 DIAGNOSIS — D631 Anemia in chronic kidney disease: Secondary | ICD-10-CM | POA: Diagnosis not present

## 2021-12-15 DIAGNOSIS — I1310 Hypertensive heart and chronic kidney disease without heart failure, with stage 1 through stage 4 chronic kidney disease, or unspecified chronic kidney disease: Secondary | ICD-10-CM | POA: Diagnosis not present

## 2021-12-15 DIAGNOSIS — Z4801 Encounter for change or removal of surgical wound dressing: Secondary | ICD-10-CM | POA: Diagnosis not present

## 2021-12-15 DIAGNOSIS — Z48815 Encounter for surgical aftercare following surgery on the digestive system: Secondary | ICD-10-CM | POA: Diagnosis not present

## 2021-12-16 DIAGNOSIS — D631 Anemia in chronic kidney disease: Secondary | ICD-10-CM | POA: Diagnosis not present

## 2021-12-16 DIAGNOSIS — Z4801 Encounter for change or removal of surgical wound dressing: Secondary | ICD-10-CM | POA: Diagnosis not present

## 2021-12-16 DIAGNOSIS — N1832 Chronic kidney disease, stage 3b: Secondary | ICD-10-CM | POA: Diagnosis not present

## 2021-12-16 DIAGNOSIS — I1310 Hypertensive heart and chronic kidney disease without heart failure, with stage 1 through stage 4 chronic kidney disease, or unspecified chronic kidney disease: Secondary | ICD-10-CM | POA: Diagnosis not present

## 2021-12-16 DIAGNOSIS — Z48815 Encounter for surgical aftercare following surgery on the digestive system: Secondary | ICD-10-CM | POA: Diagnosis not present

## 2021-12-16 DIAGNOSIS — Z432 Encounter for attention to ileostomy: Secondary | ICD-10-CM | POA: Diagnosis not present

## 2021-12-22 DIAGNOSIS — R350 Frequency of micturition: Secondary | ICD-10-CM | POA: Diagnosis not present

## 2021-12-23 DIAGNOSIS — N1832 Chronic kidney disease, stage 3b: Secondary | ICD-10-CM | POA: Diagnosis not present

## 2021-12-23 DIAGNOSIS — I1310 Hypertensive heart and chronic kidney disease without heart failure, with stage 1 through stage 4 chronic kidney disease, or unspecified chronic kidney disease: Secondary | ICD-10-CM | POA: Diagnosis not present

## 2021-12-23 DIAGNOSIS — Z48815 Encounter for surgical aftercare following surgery on the digestive system: Secondary | ICD-10-CM | POA: Diagnosis not present

## 2021-12-23 DIAGNOSIS — Z432 Encounter for attention to ileostomy: Secondary | ICD-10-CM | POA: Diagnosis not present

## 2021-12-23 DIAGNOSIS — Z4801 Encounter for change or removal of surgical wound dressing: Secondary | ICD-10-CM | POA: Diagnosis not present

## 2021-12-23 DIAGNOSIS — D631 Anemia in chronic kidney disease: Secondary | ICD-10-CM | POA: Diagnosis not present

## 2021-12-30 ENCOUNTER — Encounter (INDEPENDENT_AMBULATORY_CARE_PROVIDER_SITE_OTHER): Payer: Self-pay

## 2021-12-30 ENCOUNTER — Other Ambulatory Visit: Payer: Self-pay

## 2021-12-30 ENCOUNTER — Other Ambulatory Visit: Payer: Medicare Other | Admitting: *Deleted

## 2021-12-30 DIAGNOSIS — I719 Aortic aneurysm of unspecified site, without rupture: Secondary | ICD-10-CM | POA: Diagnosis not present

## 2021-12-30 DIAGNOSIS — I351 Nonrheumatic aortic (valve) insufficiency: Secondary | ICD-10-CM | POA: Diagnosis not present

## 2021-12-30 LAB — BASIC METABOLIC PANEL
BUN/Creatinine Ratio: 6 — ABNORMAL LOW (ref 12–28)
BUN: 9 mg/dL (ref 8–27)
CO2: 26 mmol/L (ref 20–29)
Calcium: 9.5 mg/dL (ref 8.7–10.3)
Chloride: 100 mmol/L (ref 96–106)
Creatinine, Ser: 1.45 mg/dL — ABNORMAL HIGH (ref 0.57–1.00)
Glucose: 86 mg/dL (ref 70–99)
Potassium: 3.6 mmol/L (ref 3.5–5.2)
Sodium: 141 mmol/L (ref 134–144)
eGFR: 39 mL/min/{1.73_m2} — ABNORMAL LOW (ref >59)

## 2021-12-31 ENCOUNTER — Telehealth: Payer: Self-pay

## 2021-12-31 DIAGNOSIS — D631 Anemia in chronic kidney disease: Secondary | ICD-10-CM | POA: Diagnosis not present

## 2021-12-31 DIAGNOSIS — Z432 Encounter for attention to ileostomy: Secondary | ICD-10-CM | POA: Diagnosis not present

## 2021-12-31 DIAGNOSIS — Z48815 Encounter for surgical aftercare following surgery on the digestive system: Secondary | ICD-10-CM | POA: Diagnosis not present

## 2021-12-31 DIAGNOSIS — I1310 Hypertensive heart and chronic kidney disease without heart failure, with stage 1 through stage 4 chronic kidney disease, or unspecified chronic kidney disease: Secondary | ICD-10-CM | POA: Diagnosis not present

## 2021-12-31 DIAGNOSIS — N1832 Chronic kidney disease, stage 3b: Secondary | ICD-10-CM | POA: Diagnosis not present

## 2021-12-31 DIAGNOSIS — I10 Essential (primary) hypertension: Secondary | ICD-10-CM

## 2021-12-31 DIAGNOSIS — Z4801 Encounter for change or removal of surgical wound dressing: Secondary | ICD-10-CM | POA: Diagnosis not present

## 2021-12-31 MED ORDER — AMLODIPINE BESYLATE 10 MG PO TABS: 10.0000 mg | ORAL_TABLET | Freq: Every day | ORAL | 1 refills | Status: DC

## 2021-12-31 NOTE — Telephone Encounter (Signed)
-----   Message from Nuala Alpha, LPN sent at 06/30/5052  4:55 PM EST -----  ----- Message ----- From: Early Osmond, MD Sent: 12/31/2021   4:51 PM EST To: Belva Crome, MD, Nuala Alpha, LPN  Stop exforge, start amlodipine 10 and BMP in 1 week.  Thanks. ----- Message ----- From: Nuala Alpha, LPN Sent: 08/05/6733   4:46 PM EST To: Belva Crome, MD, Early Osmond, MD   ----- Message ----- From: Interface, [facility] Lab Results In Sent: 12/30/2021   6:35 PM EST To: Belva Crome, MD

## 2022-01-04 DIAGNOSIS — M4316 Spondylolisthesis, lumbar region: Secondary | ICD-10-CM | POA: Diagnosis not present

## 2022-01-05 ENCOUNTER — Inpatient Hospital Stay: Admission: RE | Admit: 2022-01-05 | Payer: Medicare Other | Source: Ambulatory Visit

## 2022-01-05 DIAGNOSIS — Z933 Colostomy status: Secondary | ICD-10-CM | POA: Diagnosis not present

## 2022-01-05 DIAGNOSIS — K5732 Diverticulitis of large intestine without perforation or abscess without bleeding: Secondary | ICD-10-CM | POA: Diagnosis not present

## 2022-01-06 ENCOUNTER — Ambulatory Visit (HOSPITAL_COMMUNITY): Payer: Medicare Other | Attending: Interventional Cardiology

## 2022-01-06 ENCOUNTER — Other Ambulatory Visit: Payer: Self-pay

## 2022-01-06 DIAGNOSIS — I351 Nonrheumatic aortic (valve) insufficiency: Secondary | ICD-10-CM | POA: Diagnosis not present

## 2022-01-06 LAB — ECHOCARDIOGRAM COMPLETE
Area-P 1/2: 3.06 cm2
P 1/2 time: 577 msec
S' Lateral: 3.8 cm

## 2022-01-07 ENCOUNTER — Other Ambulatory Visit: Payer: Medicare Other

## 2022-01-07 DIAGNOSIS — I10 Essential (primary) hypertension: Secondary | ICD-10-CM

## 2022-01-07 DIAGNOSIS — K5732 Diverticulitis of large intestine without perforation or abscess without bleeding: Secondary | ICD-10-CM | POA: Diagnosis not present

## 2022-01-07 DIAGNOSIS — Z933 Colostomy status: Secondary | ICD-10-CM | POA: Diagnosis not present

## 2022-01-07 LAB — BASIC METABOLIC PANEL
BUN/Creatinine Ratio: 12 (ref 12–28)
BUN: 13 mg/dL (ref 8–27)
CO2: 28 mmol/L (ref 20–29)
Calcium: 9.2 mg/dL (ref 8.7–10.3)
Chloride: 99 mmol/L (ref 96–106)
Creatinine, Ser: 1.1 mg/dL — ABNORMAL HIGH (ref 0.57–1.00)
Glucose: 93 mg/dL (ref 70–99)
Potassium: 3.7 mmol/L (ref 3.5–5.2)
Sodium: 138 mmol/L (ref 134–144)
eGFR: 54 mL/min/{1.73_m2} — ABNORMAL LOW (ref >59)

## 2022-01-08 ENCOUNTER — Ambulatory Visit (HOSPITAL_COMMUNITY)
Admission: RE | Admit: 2022-01-08 | Discharge: 2022-01-08 | Disposition: A | Discharge status: Home / Self Care | Payer: Medicare Other | Source: Ambulatory Visit | Attending: Nurse Practitioner | Admitting: Nurse Practitioner

## 2022-01-08 DIAGNOSIS — K9400 Colostomy complication, unspecified: Secondary | ICD-10-CM | POA: Diagnosis not present

## 2022-01-08 DIAGNOSIS — Z433 Encounter for attention to colostomy: Secondary | ICD-10-CM | POA: Insufficient documentation

## 2022-01-08 NOTE — Progress Notes (Signed)
[facility]   Reason for visit:  LLQ colostomy  no leaks, daughter assists with pouch changes.  Pouch fills with gas and I recommend filtered pouch, will update orders with [facility].   HPI:  Diverticulitis with sigmoid colon perforation and LLQ colostomy ROS  Review of Systems  Gastrointestinal:        LLQ colostomy, well budded  Psychiatric/Behavioral: Negative.         Anticipating reversal.  All other systems reviewed and are negative. Vital signs:  There were no vitals taken for this visit. Exam:  Physical Exam Constitutional:      Appearance: Normal appearance. She is normal weight.  Abdominal:     Palpations: Abdomen is soft.     Comments: LLQ colostomy  Skin:    General: Skin is warm and dry.  Neurological:     Mental Status: She is alert and oriented to person, place, and time.  Psychiatric:        Mood and Affect: Mood normal.        Behavior: Behavior normal.        Thought Content: Thought content normal.        Judgment: Judgment normal.    Stoma type/location:  LLQ colostomy, well budded Stomal assessment/size:  1 3/8" pink and moist, productive of soft brown stool Peristomal assessment:  intact Treatment options for stomal/peristomal skin: barrier ring and 1 piece pouch.  Recommend adding filter Output: soft brown stool Ostomy pouching: 1pc flat pouch with barrier ring Education provided:  Anxious for reversal.  We discussed need to be medically stable prior to another surgery. Nutrition, smoking cessation and overall health will determine time line. She verbalizes understanding.     Impression/dx  LLQ colostomy Discussion  Adding filtered pouch, will call [facility].  Plan  Follow up with surgeon regarding reversal.     Visit time: 45 minutes.   Domenic Moras FNP-BC

## 2022-01-08 NOTE — Discharge Instructions (Signed)
Recommend filtered pouch   Will call [facility]

## 2022-01-09 NOTE — Progress Notes (Incomplete)
[facility]   Reason for visit:  RLQ ileostomy  Experiencing breakdown around stoma, has had stoma for years and this is new.  HPI:  Ileostomy 4 years ago. Wears coloplast 2 piece pouch.   ROS  Review of Systems  Gastrointestinal:        RLQ ileostomy  Skin:        Nonintact wound to peristomal skin  Psychiatric/Behavioral:  The patient is nervous/anxious.        Wound is tender to touch  All other systems reviewed and are negative.  Vital signs:  BP 107/76 (BP Location: Right Arm)   Pulse 100   Temp 98.8 F (37.1 C) (Oral)   Resp 20   SpO2 99%  Exam:  Physical Exam Vitals reviewed.  Constitutional:      Appearance: Normal appearance.  Abdominal:     Palpations: Abdomen is soft.  Skin:    Findings: Lesion present.     Comments: Peristomal wound present, new onset  Neurological:     Mental Status: She is alert and oriented to person, place, and time.  Psychiatric:        Behavior: Behavior normal.     Stoma type/location:  RLQ ileostomy we re-measure and cut barrier opening a little larger than what she had been cutting.  Stomal assessment/size:  pink and moist, flush- 0.3  cm nonintact lesion at 7 o'clock to peristomal area.  Peristomal assessment:  see above, nonintact lesion Treatment options for stomal/peristomal skin:  will implement topical wound care to the open area.  Aquacel Ag applied to open area and covered with barrier ring. Would like to switch to convex 2 piece pouch. Coloplast Mio 2 piece applied.   Output: liquid brown stool Ostomy pouching: 2pc. Coloplast pouch with barrier ring and aquacel Ag to wound.   Change twice weekly and PRN leakage.  Will followup and switch to the new pouch if she prefers.   Education provided:  We discuss and demonstrate wound care and the snap lock closure on new pouch style.  I provide her with samples for a couple more changes, as well as more silver hydrofiber.     Impression/dx  Contact  dermatitis Discussion  See above  Plan  See back in one week.     Visit time: 55 minutes.   Ryler Laskowski FNP-BC    

## 2022-01-11 ENCOUNTER — Telehealth: Payer: Self-pay | Admitting: *Deleted

## 2022-01-11 DIAGNOSIS — I10 Essential (primary) hypertension: Secondary | ICD-10-CM

## 2022-01-11 DIAGNOSIS — I719 Aortic aneurysm of unspecified site, without rupture: Secondary | ICD-10-CM

## 2022-01-11 NOTE — Telephone Encounter (Addendum)
-----   Message from Nuala Alpha, LPN sent at 1/76/1607  8:28 AM EST -----  ----- Message ----- From: Early Osmond, MD Sent: 01/08/2022   8:11 AM EST To: [facility] Triage  Cr improved.  If BP > 130/80, please start losartan 50 and obtain BMP in one week.  ______________________________________________________________________________________   I called the patient.  She was recently started on amlodipine 10 mg daily by Dr. Ali Lowe.  See telephone note 12/31/21.  BPs running 130-145/90.  She tells me she is out of spironolactone and that is contributing to her higher BP readings.  I asked her to check w [facility] as it was renewed in November 2022.  Asked her to monitor BP when she gets back on spironolactone.  I did not ask her to start losartan at this time and will ask Dr. Tamala Julian and his nurse to review and call her back with further recommendations.

## 2022-01-11 NOTE — Telephone Encounter (Signed)
Follow Up:    Patient is returning [place] B. Call from today.

## 2022-01-11 NOTE — Telephone Encounter (Signed)
After seeing this note, it is okay to resume the spironolactone but stay off losartan.  Bmet in 1 week.  ----- Message -----  From: Rodman Key, RN  Sent: 01/11/2022  12:54 PM EST  To: Belva Crome, MD, Loren Racer, RN   Left message to call back

## 2022-01-11 NOTE — Telephone Encounter (Signed)
Spoke with pt and reviewed recommendations.  Pt agreeable to plan.  She will come for labs on 01/18/22.

## 2022-01-12 DIAGNOSIS — Z4801 Encounter for change or removal of surgical wound dressing: Secondary | ICD-10-CM | POA: Diagnosis not present

## 2022-01-12 DIAGNOSIS — D631 Anemia in chronic kidney disease: Secondary | ICD-10-CM | POA: Diagnosis not present

## 2022-01-12 DIAGNOSIS — I1310 Hypertensive heart and chronic kidney disease without heart failure, with stage 1 through stage 4 chronic kidney disease, or unspecified chronic kidney disease: Secondary | ICD-10-CM | POA: Diagnosis not present

## 2022-01-12 DIAGNOSIS — N1832 Chronic kidney disease, stage 3b: Secondary | ICD-10-CM | POA: Diagnosis not present

## 2022-01-12 DIAGNOSIS — Z432 Encounter for attention to ileostomy: Secondary | ICD-10-CM | POA: Diagnosis not present

## 2022-01-12 DIAGNOSIS — Z48815 Encounter for surgical aftercare following surgery on the digestive system: Secondary | ICD-10-CM | POA: Diagnosis not present

## 2022-01-15 DIAGNOSIS — E782 Mixed hyperlipidemia: Secondary | ICD-10-CM | POA: Diagnosis not present

## 2022-01-15 DIAGNOSIS — R600 Localized edema: Secondary | ICD-10-CM | POA: Diagnosis not present

## 2022-01-15 DIAGNOSIS — G44209 Tension-type headache, unspecified, not intractable: Secondary | ICD-10-CM | POA: Diagnosis not present

## 2022-01-15 DIAGNOSIS — K219 Gastro-esophageal reflux disease without esophagitis: Secondary | ICD-10-CM | POA: Diagnosis not present

## 2022-01-15 DIAGNOSIS — I10 Essential (primary) hypertension: Secondary | ICD-10-CM | POA: Diagnosis not present

## 2022-01-15 DIAGNOSIS — Z136 Encounter for screening for cardiovascular disorders: Secondary | ICD-10-CM | POA: Diagnosis not present

## 2022-01-15 DIAGNOSIS — Z0001 Encounter for general adult medical examination with abnormal findings: Secondary | ICD-10-CM | POA: Diagnosis not present

## 2022-01-18 ENCOUNTER — Other Ambulatory Visit: Payer: Medicare Other | Admitting: *Deleted

## 2022-01-18 ENCOUNTER — Other Ambulatory Visit: Payer: Self-pay

## 2022-01-18 DIAGNOSIS — I10 Essential (primary) hypertension: Secondary | ICD-10-CM

## 2022-01-18 LAB — BASIC METABOLIC PANEL
BUN/Creatinine Ratio: 11 — ABNORMAL LOW (ref 12–28)
BUN: 13 mg/dL (ref 8–27)
CO2: 28 mmol/L (ref 20–29)
Calcium: 9.1 mg/dL (ref 8.7–10.3)
Chloride: 101 mmol/L (ref 96–106)
Creatinine, Ser: 1.15 mg/dL — ABNORMAL HIGH (ref 0.57–1.00)
Glucose: 99 mg/dL (ref 70–99)
Potassium: 4.1 mmol/L (ref 3.5–5.2)
Sodium: 143 mmol/L (ref 134–144)
eGFR: 51 mL/min/{1.73_m2} — ABNORMAL LOW (ref >59)

## 2022-01-26 ENCOUNTER — Ambulatory Visit
Admission: RE | Admit: 2022-01-26 | Discharge: 2022-01-26 | Disposition: A | Discharge status: Home / Self Care | Payer: Medicare Other | Source: Ambulatory Visit | Attending: Interventional Cardiology | Admitting: Interventional Cardiology

## 2022-01-26 DIAGNOSIS — I712 Thoracic aortic aneurysm, without rupture, unspecified: Secondary | ICD-10-CM | POA: Diagnosis not present

## 2022-01-26 DIAGNOSIS — I719 Aortic aneurysm of unspecified site, without rupture: Secondary | ICD-10-CM

## 2022-01-26 MED ORDER — IOPAMIDOL (ISOVUE-370) INJECTION 76%
75.0000 mL | Freq: Once | INTRAVENOUS | Status: AC | PRN
  Administered 2022-01-26: 75 mL via INTRAVENOUS

## 2022-01-28 ENCOUNTER — Telehealth: Payer: Self-pay | Admitting: Hematology and Oncology

## 2022-01-28 NOTE — Telephone Encounter (Signed)
Scheduled appt per 3/1 referral. Pt is aware of appt date and time. Pt is aware to arrive 15 mins prior to appt time and to bring and updated insurance card. Pt is aware of appt location.   ?

## 2022-01-29 ENCOUNTER — Telehealth: Payer: Self-pay | Admitting: Interventional Cardiology

## 2022-01-29 DIAGNOSIS — I719 Aortic aneurysm of unspecified site, without rupture: Secondary | ICD-10-CM

## 2022-01-29 NOTE — Telephone Encounter (Signed)
Spoke with pt and reviewed results.  Pt agreeable to referral.  She is aware that office will contact her to get her scheduled.   ?

## 2022-01-29 NOTE — Telephone Encounter (Signed)
Patient returning call for CT results. 

## 2022-02-05 ENCOUNTER — Other Ambulatory Visit: Payer: Self-pay | Admitting: Physician Assistant

## 2022-02-05 DIAGNOSIS — Z1231 Encounter for screening mammogram for malignant neoplasm of breast: Secondary | ICD-10-CM

## 2022-02-09 DIAGNOSIS — K5732 Diverticulitis of large intestine without perforation or abscess without bleeding: Secondary | ICD-10-CM | POA: Diagnosis not present

## 2022-02-09 DIAGNOSIS — Z933 Colostomy status: Secondary | ICD-10-CM | POA: Diagnosis not present

## 2022-02-10 NOTE — Progress Notes (Signed)
[facility] CONSULT NOTE  Patient Care Team: Norm Salt, [place] as PCP - General (Physician Assistant) Lyn Records, MD as PCP - Cardiology (Cardiology)  CHIEF COMPLAINTS/PURPOSE OF CONSULTATION: Leukopenia   HISTORY OF PRESENTING ILLNESS:  Chelsea [name] 71 y.o. female is here because of recent diagnosis of leukopenia. She presents to the clinic today for treatment plan.  She was recently in the hospital with a perforated diverticulitis status post [facility] procedure.  She had a postop ileus and complications including acute kidney injury.  She has recovered from some of these issues.  Labs done in February showed a white count of 2.6 and ANC of 1.4.  She was referred to [place] for evaluation treatment of the leukopenia.  She denies any further issues with fevers or chills.  She is recovering from the hospitalization fairly well.  I reviewed her records extensively and collaborated the history with the patient.  SUMMARY OF ONCOLOGIC HISTORY: Oncology History   No history exists.     MEDICAL HISTORY:  Past Medical History:  Diagnosis Date   Anxiety    Aortic aneurysm without rupture [facility])    followed by dr dr h. Katrinka Blazing,  per echo 08/ 2021 44mm and chest CT 03/ 2021 4.0.cm   Arthritis    lower back   BPPV (benign paroxysmal positional vertigo)    Chronic back pain    CKD (chronic kidney disease), stage II    Depression    GERD (gastroesophageal reflux disease)    Headache    Hemiparesis affecting left side as late effect of cerebrovascular accident (HCC)    Hiatal hernia    History of anemia    History of cerebrovascular accident (CVA) with residual deficit 03/2006   (neurologist--- dr Frances Furbish) admission in epic, acute right thalamic hypertensive hemorrhage w/ left sided hemiparesis   History of gastric ulcer 2003   w/ upper gi bleed,  egd with no intervention needed   Hyperlipidemia    Hypertension    followed by cardiology--- dr h. Katrinka Blazing and HTN clinic    (nuclear stress test 01-31-20121 normal w/ ef 54%; cardaic cath 08-07-2010 no sig coronary obstruction, preserved LVF, mild AR   Multinodular goiter    followed by pcp,  last ultrasound in epic 03-20-2020 , never met critiria for bx   Nonrheumatic aortic (valve) insufficiency    followed by cardiologist--- dr h. 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,  followed by dr Frances Furbish   Urge incontinence    Wears glasses    Wears hearing aid in both ears    Wears partial dentures    upper and lower    SURGICAL HISTORY: Past Surgical History:  Procedure Laterality Date   ABDOMINAL HYSTERECTOMY     CARDIAC CATHETERIZATION  08/07/2010   @ [facility]  dr Riley Kill---  no sig coronary obstruction, preserved LVF, mild AR   COLON RESECTION SIGMOID N/A 11/08/2021   Procedure: COLON RESECTION SIGMOID; DRAINAGE OF INTRAABDOMINAL ABSCESS;  Surgeon: Violeta Gelinas, MD;  Location: [facility] OR;  Service: General;  Laterality: N/A;   COLOSTOMY N/A 11/08/2021   Procedure: COLOSTOMY;  Surgeon: Violeta Gelinas, MD;  Location[facility] OR;  Service: General;  Laterality: N/A;   MASS EXCISION N/A 06/04/2021   Procedure: EXCISION SUCUTANEOUS MASS X2, UPPER BACK;  Surgeon: Berna Bue, MD;  Location: [facility] OR;  Service: General;  Laterality: N/A;   TOTAL KNEE ARTHROPLASTY Left 01/05/2016   Procedure: TOTAL KNEE ARTHROPLASTY;  Surgeon: Gean Birchwood, MD;  Location: [facility] OR;  Service: Orthopedics;  Laterality: Left;   TUBAL LIGATION Bilateral 2000    SOCIAL HISTORY: Social History   Socioeconomic History   Marital status: Married    Spouse name: Not on file   Number of children:  3   Years of education: 11   Highest education level: Not on file  Occupational History   Occupation: child care    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  Tobacco Use   Smoking status: Every Day    Packs/day: 1.00    Years: 50.00    Pack years: 50.00    Types: Cigarettes   Smokeless tobacco: Never  Vaping Use   Vaping Use: Never used  Substance and Sexual Activity   Alcohol use: No    Alcohol/week: 0.0 standard drinks   Drug use: No   Sexual activity: Never  Other Topics Concern   Not on file  Social History Narrative   Patient consumes no caffeine   Social Determinants of Corporate investment banker Strain: Not on file  Food Insecurity: Not on file  Transportation Needs: Not on file  Physical Activity: Not on file  Stress: Not on file  Social Connections: Not on file  Intimate Partner Violence: Not on file    FAMILY HISTORY: Family History  Problem Relation Age of Onset   Hypertension Mother        deceased   Peripheral vascular disease Mother    Cirrhosis Father        DeCEASED   Heart attack Other        grandmother deceased   Cancer Brother    Drug abuse Brother        overdose, in 1 brother   Breast cancer Neg Hx     ALLERGIES:  is allergic to other, hydrocodone, and oxycodone.  MEDICATIONS:  Current Outpatient Medications  Medication Sig Dispense Refill   acetaminophen (TYLENOL) 500 MG tablet Take 2 tablets (1,000 mg total) by mouth every 6 (six) hours as needed. 30 tablet 0   amLODipine (NORVASC) 10 MG tablet Take 1 tablet (10 mg total) by mouth daily. 90 tablet 1   aspirin EC 81 MG tablet Take 1 tablet (81 mg total) by mouth daily.     atorvastatin (LIPITOR) 10 MG tablet TAKE 1 TABLET BY MOUTH  DAILY (Patient taking differently: Take 10 mg by mouth at bedtime.) 90 tablet 3   chlorhexidine (PERIDEX) 0.12 % solution Use as directed 15 mLs in the mouth or throat 2 (two) times daily.     cholecalciferol (VITAMIN D) 25 MCG  (1000 UNIT) tablet Take 1,000 Units by mouth daily.     citalopram (CELEXA) 10 MG tablet Take 10 mg by mouth daily.     cloNIDine (CATAPRES) 0.3 MG tablet Take 0.3 mg by mouth 2 (two) times daily.     ezetimibe (ZETIA) 10 MG tablet Take 10 mg by mouth daily.     gabapentin (NEURONTIN) 800 MG tablet Take 1 tablet (800 mg total) by mouth 2 (  two) times daily. 180 tablet 3   indapamide (LOZOL) 2.5 MG tablet Take 1 tablet (2.5 mg total) by mouth daily. 30 tablet 11   methocarbamol (ROBAXIN) 750 MG tablet Take 1 tablet (750 mg total) by mouth 3 (three) times daily. 60 tablet 0   metoprolol succinate (TOPROL-XL) 50 MG 24 hr tablet Take 3 tablets (150 mg total) by mouth daily. Take with or immediately following a meal. 90 tablet 1   mirabegron ER (MYRBETRIQ) 25 MG TB24 tablet Take 25 mg by mouth daily.     Multiple Vitamin (MULTIVITAMIN) tablet Take 1 tablet by mouth daily.       nicotine (NICODERM CQ - DOSED IN MG/24 HOURS) 21 mg/24hr patch PLACE 1 PATCH (21 MG TOTAL) ONTO THE SKIN DAILY. 28 patch 0   ondansetron (ZOFRAN) 8 MG tablet Take 8 mg by mouth 3 (three) times daily as needed for vomiting or nausea.     pantoprazole (PROTONIX) 40 MG tablet Take 40 mg by mouth daily.     spironolactone (ALDACTONE) 25 MG tablet Take 1 tablet (25 mg total) by mouth daily. 90 tablet 2   traMADol (ULTRAM) 50 MG tablet Take 1-2 tablets (50-100 mg total) by mouth every 6 (six) hours as needed for moderate pain. 20 tablet 0   vitamin B-12 (CYANOCOBALAMIN) 500 MCG tablet Take 500 mcg by mouth daily.     vitamin E 1000 UNIT capsule Take 1,000 Units by mouth daily.     No current facility-administered medications for this visit.    REVIEW OF SYSTEMS:   Constitutional: Denies fevers, chills or abnormal night sweats   All other systems were reviewed with the patient and are negative.  PHYSICAL EXAMINATION: ECOG PERFORMANCE STATUS: 1 - Symptomatic but completely ambulatory  Vitals:   02/12/22 1333  BP: (!) 173/99   Pulse: (!) 123  Resp: 18  Temp: 97.7 F (36.5 C)  SpO2: 98%   Filed Weights   02/12/22 1333  Weight: 165 lb 3.2 oz (74.9 kg)      LABORATORY DATA:  I have reviewed the data as listed Lab Results  Component Value Date   WBC 3.7 (L) 02/12/2022   HGB 13.6 02/12/2022   HCT 44.1 02/12/2022   MCV 90.2 02/12/2022   PLT 359 02/12/2022   Lab Results  Component Value Date   NA 143 01/18/2022   K 4.1 01/18/2022   CL 101 01/18/2022   CO2 28 01/18/2022    RADIOGRAPHIC STUDIES: I have personally reviewed the radiological reports and agreed with the findings in the report.  ASSESSMENT AND PLAN:  Leukopenia Lab review: 08/29/2012: WBC 4.6 11/06/2021: WBC 12.6 11/13/2021: WBC 12, platelets 415 Feb 2023: WBC 2.6, ANC 1.44, ALC 0.79 Hospitalization 11/04/2021-11/13/2021: Perforated diverticulitis status post Hartman's procedure, postop ileus, A-fib, AKI The cause of the elevated white blood cell count is elevated neutrophils.  This is related to the recent surgeries and inflammation/infections.  Plan: Recheck labs today. Today's WBC count is 3.7 Check B12 and ANA as well   Most likely etiology for the leukopenia is her recent infections and hospitalizations. I suspect that she will fully recover if rechecked in 1 month. Return to clinic in 1 week with a telephone visit to discuss the rest of the results.   All questions were answered. The patient knows to call the clinic with any problems, questions or concerns.    Tamsen Meek, MD 02/12/22  I Janan Ridge, am acting as a scribe for Dr. Pamelia Hoit

## 2022-02-11 DIAGNOSIS — Z933 Colostomy status: Secondary | ICD-10-CM | POA: Diagnosis not present

## 2022-02-11 DIAGNOSIS — K5732 Diverticulitis of large intestine without perforation or abscess without bleeding: Secondary | ICD-10-CM | POA: Diagnosis not present

## 2022-02-12 ENCOUNTER — Inpatient Hospital Stay: Payer: Medicare Other

## 2022-02-12 ENCOUNTER — Inpatient Hospital Stay: Payer: Medicare Other | Attending: Hematology and Oncology | Admitting: Hematology and Oncology

## 2022-02-12 ENCOUNTER — Other Ambulatory Visit: Payer: Self-pay

## 2022-02-12 ENCOUNTER — Other Ambulatory Visit: Payer: Self-pay | Admitting: *Deleted

## 2022-02-12 DIAGNOSIS — D709 Neutropenia, unspecified: Secondary | ICD-10-CM

## 2022-02-12 DIAGNOSIS — Z8673 Personal history of transient ischemic attack (TIA), and cerebral infarction without residual deficits: Secondary | ICD-10-CM | POA: Insufficient documentation

## 2022-02-12 DIAGNOSIS — Z809 Family history of malignant neoplasm, unspecified: Secondary | ICD-10-CM | POA: Diagnosis not present

## 2022-02-12 DIAGNOSIS — D72819 Decreased white blood cell count, unspecified: Secondary | ICD-10-CM | POA: Insufficient documentation

## 2022-02-12 DIAGNOSIS — F17210 Nicotine dependence, cigarettes, uncomplicated: Secondary | ICD-10-CM | POA: Insufficient documentation

## 2022-02-12 LAB — CBC WITH DIFFERENTIAL (CANCER CENTER ONLY)
Abs Immature Granulocytes: 0.01 10*3/uL (ref 0.00–0.07)
Basophils Absolute: 0 10*3/uL (ref 0.0–0.1)
Basophils Relative: 1 %
Eosinophils Absolute: 0.1 10*3/uL (ref 0.0–0.5)
Eosinophils Relative: 3 %
HCT: 44.1 % (ref 36.0–46.0)
Hemoglobin: 13.6 g/dL (ref 12.0–15.0)
Immature Granulocytes: 0 %
Lymphocytes Relative: 25 %
Lymphs Abs: 0.9 10*3/uL (ref 0.7–4.0)
MCH: 27.8 pg (ref 26.0–34.0)
MCHC: 30.8 g/dL (ref 30.0–36.0)
MCV: 90.2 fL (ref 80.0–100.0)
Monocytes Absolute: 0.3 10*3/uL (ref 0.1–1.0)
Monocytes Relative: 8 %
Neutro Abs: 2.3 10*3/uL (ref 1.7–7.7)
Neutrophils Relative %: 63 %
Platelet Count: 359 10*3/uL (ref 150–400)
RBC: 4.89 MIL/uL (ref 3.87–5.11)
RDW: 13.7 % (ref 11.5–15.5)
WBC Count: 3.7 10*3/uL — ABNORMAL LOW (ref 4.0–10.5)
nRBC: 0 % (ref 0.0–0.2)

## 2022-02-12 LAB — VITAMIN B12: Vitamin B-12: 2225 pg/mL — ABNORMAL HIGH (ref 180–914)

## 2022-02-12 NOTE — Assessment & Plan Note (Addendum)
Lab review: ?08/29/2012: WBC 4.6 ?11/06/2021: WBC 12.6 ?11/13/2021: WBC 12, platelets 415 ?Feb 2023: WBC 2.6, ANC 1.44, ALC 0.79 ?Hospitalization 11/04/2021-11/13/2021: Perforated diverticulitis status post Hartman's procedure, postop ileus, A-fib, AKI ?The cause of the elevated white blood cell count is elevated neutrophils.  This is related to the recent surgeries and inflammation/infections. ? ?Plan: [place] labs today. ?Check B12 and ANA as well ?  ?Most likely etiology for the leukopenia is her recent infections and hospitalizations. ? ?

## 2022-02-16 ENCOUNTER — Ambulatory Visit
Admission: RE | Admit: 2022-02-16 | Discharge: 2022-02-16 | Disposition: A | Discharge status: Home / Self Care | Payer: Medicare Other | Source: Ambulatory Visit | Attending: Physician Assistant | Admitting: Physician Assistant

## 2022-02-16 DIAGNOSIS — Z1231 Encounter for screening mammogram for malignant neoplasm of breast: Secondary | ICD-10-CM | POA: Diagnosis not present

## 2022-02-17 LAB — ANTINUCLEAR ANTIBODIES, IFA: ANA Ab, IFA: NEGATIVE

## 2022-02-18 NOTE — Assessment & Plan Note (Signed)
Lab review: ?08/29/2012: WBC 4.6 ?11/06/2021: WBC 12.6 ?11/13/2021: WBC 12, platelets 415 ?Feb 2023: WBC 2.6, ANC 1.44, ALC 0.79 ?02/12/2022: WBC 3.7, hemoglobin 13.6, platelets 359, ANC 2.3, B12 2225 ? ?Hospitalization 11/04/2021-11/13/2021: Perforated diverticulitis status post Hartman's procedure, postop ileus, A-fib, AKI ? ?

## 2022-02-18 NOTE — Progress Notes (Signed)
HEMATOLOGY-ONCOLOGY TELEPHONE VISIT PROGRESS NOTE ? ?I connected with '@PTNAME'$ @ on 02/19/22 at 10:30 AM EDT by telephone and verified that I am speaking with the correct person using two identifiers.  ?I discussed the limitations, risks, security and privacy concerns of performing an evaluation and management service by telephone and the availability of in person appointments.  ?I also discussed with the patient that there may be a patient responsible charge related to this service. The patient expressed understanding and agreed to proceed.  ? ?CHIEF COMPLAINTS/PURPOSE OF CONSULTATION: Leukopenia ?She reports no new problems or concerns. ? ?REVIEW OF SYSTEMS:   ?Constitutional: Denies fevers, chills or abnormal weight loss ?  ?All other systems were reviewed with the patient and are negative. ?Observations/Objective:  ?  ?Assessment Plan:  ?Leukopenia ?Lab review: ?08/29/2012: WBC 4.6 ?11/06/2021: WBC 12.6 ?11/13/2021: WBC 12, platelets 415 ?Feb 2023: WBC 2.6, ANC 1.44, ALC 0.79 ?02/12/2022: WBC 3.7, hemoglobin 13.6, platelets 359, ANC 2.3, B12 2225 ? ?Hospitalization 11/04/2021-11/13/2021: Perforated diverticulitis status post Hartman's procedure, postop ileus, A-fib, AKI ?5-monthfollow-up with labs and if the labs look stable then she will not need to see [place]? ?I discussed the assessment and treatment plan with the patient. The patient was provided an opportunity to ask questions and all were answered. The patient agreed with the plan and demonstrated an understanding of the instructions. The patient was advised to call back or seek an in-person evaluation if the symptoms worsen or if the condition fails to improve as anticipated.  ? ?I provided 11 minutes of non-face-to-face time during this encounter. VHarriette Ohara MD   ? ?I DGardiner Coinsam scribing for Dr. GLindi Adie? ?I have reviewed the above documentation for accuracy and completeness, and I agree with the above. ?  ?

## 2022-02-19 ENCOUNTER — Inpatient Hospital Stay (HOSPITAL_BASED_OUTPATIENT_CLINIC_OR_DEPARTMENT_OTHER): Payer: Medicare Other | Admitting: Hematology and Oncology

## 2022-02-19 DIAGNOSIS — D709 Neutropenia, unspecified: Secondary | ICD-10-CM

## 2022-02-22 ENCOUNTER — Telehealth: Payer: Self-pay | Admitting: Hematology and Oncology

## 2022-02-22 DIAGNOSIS — G44209 Tension-type headache, unspecified, not intractable: Secondary | ICD-10-CM | POA: Diagnosis not present

## 2022-02-22 DIAGNOSIS — R600 Localized edema: Secondary | ICD-10-CM | POA: Diagnosis not present

## 2022-02-22 DIAGNOSIS — K219 Gastro-esophageal reflux disease without esophagitis: Secondary | ICD-10-CM | POA: Diagnosis not present

## 2022-02-22 DIAGNOSIS — Z0001 Encounter for general adult medical examination with abnormal findings: Secondary | ICD-10-CM | POA: Diagnosis not present

## 2022-02-22 DIAGNOSIS — I10 Essential (primary) hypertension: Secondary | ICD-10-CM | POA: Diagnosis not present

## 2022-02-22 DIAGNOSIS — E782 Mixed hyperlipidemia: Secondary | ICD-10-CM | POA: Diagnosis not present

## 2022-02-22 NOTE — Telephone Encounter (Signed)
Scheduled appointment per 3/24 los. Patient is aware. ?

## 2022-02-23 ENCOUNTER — Institutional Professional Consult (permissible substitution) (INDEPENDENT_AMBULATORY_CARE_PROVIDER_SITE_OTHER): Payer: Medicare Other | Admitting: Physician Assistant

## 2022-02-23 ENCOUNTER — Other Ambulatory Visit: Payer: Self-pay

## 2022-02-23 DIAGNOSIS — I712 Thoracic aortic aneurysm, without rupture, unspecified: Secondary | ICD-10-CM | POA: Diagnosis not present

## 2022-02-23 NOTE — Progress Notes (Signed)
? ?   ?[address] ?      [place] [address] ?            [phone]   ? ?  ? ? ?Sarina Ill ?[id number] ?21-Apr-1951 ? ?History of Present Illness: ? ?Chelsea [name] is a 71 yo female with history of Aortic Regurgitation, HLD, HTN, Pulmonary HTN, A. Fib, and recent colonic perforation.  She presents today for Ascending Aortic Aneurysm.  The patient was initially diagnosed with her aneurysm back in 2021.  The patient had not been scan in 2022.  She is a patient of Dr. Daneen Schick who initially found aneurysm on Echocardiogram.  Her most recent Echocardiogram shows progression of her AI to moderate to severe.  Overall the patient is short of breath a times, mostly with exertion going up the steps.  She is still recovering from her colonic perforation and is hoping to have her colostomy reversal soon.  The patient is a former smoker having quit in December of last year at the time of her GI surgery.  She is currently out of BP medications and these are to be delivered today.  She denies chest pain, but states she has chronic back discomfort.  She had a stroke in the past with residual deficits on her left side. ? ?Current Outpatient Medications on File Prior to Visit  ?Medication Sig Dispense Refill  ? acetaminophen (TYLENOL) 500 MG tablet Take 2 tablets (1,000 mg total) by mouth every 6 (six) hours as needed. 30 tablet 0  ? amLODipine (NORVASC) 10 MG tablet Take 1 tablet (10 mg total) by mouth daily. 90 tablet 1  ? aspirin EC 81 MG tablet Take 1 tablet (81 mg total) by mouth daily.    ? atorvastatin (LIPITOR) 10 MG tablet TAKE 1 TABLET BY MOUTH  DAILY (Patient taking differently: Take 10 mg by mouth at bedtime.) 90 tablet 3  ? chlorhexidine (PERIDEX) 0.12 % solution Use as directed 15 mLs in the mouth or throat 2 (two) times daily.    ? cholecalciferol (VITAMIN D) 25 MCG (1000 UNIT) tablet Take 1,000 Units by mouth daily.    ? citalopram (CELEXA) 10 MG tablet Take 10 mg by mouth daily.    ?  cloNIDine (CATAPRES) 0.3 MG tablet Take 0.3 mg by mouth 2 (two) times daily.    ? ezetimibe (ZETIA) 10 MG tablet Take 10 mg by mouth daily.    ? gabapentin (NEURONTIN) 800 MG tablet Take 1 tablet (800 mg total) by mouth 2 (two) times daily. 180 tablet 3  ? indapamide (LOZOL) 2.5 MG tablet Take 1 tablet (2.5 mg total) by mouth daily. 30 tablet 11  ? methocarbamol (ROBAXIN) 750 MG tablet Take 1 tablet (750 mg total) by mouth 3 (three) times daily. 60 tablet 0  ? metoprolol succinate (TOPROL-XL) 50 MG 24 hr tablet Take 3 tablets (150 mg total) by mouth daily. Take with or immediately following a meal. 90 tablet 1  ? mirabegron ER (MYRBETRIQ) 25 MG TB24 tablet Take 25 mg by mouth daily.    ? Multiple Vitamin (MULTIVITAMIN) tablet Take 1 tablet by mouth daily.      ? nicotine (NICODERM CQ - DOSED IN MG/24 HOURS) 21 mg/24hr patch PLACE 1 PATCH (21 MG TOTAL) ONTO THE SKIN DAILY. 28 patch 0  ? ondansetron (ZOFRAN) 8 MG tablet Take 8 mg by mouth 3 (three) times daily as needed for vomiting or nausea.    ? pantoprazole (PROTONIX) 40 MG tablet  Take 40 mg by mouth daily.    ? promethazine (PHENERGAN) 12.5 MG tablet Take 12.5 mg by mouth every 6 (six) hours as needed.    ? spironolactone (ALDACTONE) 25 MG tablet Take 1 tablet (25 mg total) by mouth daily. 90 tablet 2  ? traMADol (ULTRAM) 50 MG tablet Take 1-2 tablets (50-100 mg total) by mouth every 6 (six) hours as needed for moderate pain. 20 tablet 0  ? vitamin B-12 (CYANOCOBALAMIN) 500 MCG tablet Take 500 mcg by mouth daily.    ? vitamin E 1000 UNIT capsule Take 1,000 Units by mouth daily.    ? ?No current facility-administered medications on file prior to visit.  ? ? ?BP (!) 189/116 (BP Location: Right Arm, Patient Position: Sitting, Cuff Size: Normal)   Pulse (!) 104   Resp 20   Ht '5\' 6"'$  (1.676 m)   Wt 168 lb 6.4 oz (76.4 kg)   SpO2 94%   BMI 27.18 kg/m?  ? ?Physical Exam ? ?Gen: no apparent distress ?Heart: RRR ?Lungs: CTA bilaterally ?Abd: soft colostomy bag  present ?Ext: mild edema ?Neuro- grossly intact, deficits in LUE from previous stroke ? ?CTA Results: ? ?1. Interval increased size of fusiform ascending thoracic aortic ?aneurysm which now measures up to 4.4 cm, previously 4.0 cm. ?Recommend annual imaging followup by CTA or MRA. This recommendation ?follows 2010 ACCF/AHA/AATS/ACR/ASA/SCA/SCAI/SIR/STS/SVM Guidelines ?for the Diagnosis and Management of Patients with Thoracic Aortic ?Disease. Circulation. 2010; 121: A453-M468. Aortic aneurysm NOS ?(ICD10-I71.9) ?2. Unchanged global cardiomegaly. ?  ?Non-Vascular: ?  ?1. No acute intrathoracic abnormality. ?2. Cholelithiasis. ?  ?Ruthann Cancer, MD ?  ?[facility] ?  ?[facility] ?  ?  ?Electronically Signed ?  By: Ruthann Cancer M.D. ?  On: 01/26/2022 12:57 ? ?A/P: ? ?Ascending Aortic Aneurysm- increased from 4.0 to 4.4 cm since 2021 ?Moderate to Severe Aortic Insufficiency-followed by Dr. Tamala Julian ?HTN- patients BP elevated today, she has ran out of medication and these are suppose to be delivered today, I stressed the importance of continued use of medications and she should try to avoid running out... as high blood pressure can lead to further dilation and increased risk of dissection ?Dispo- with progression of patient AI to moderate to severe and enlargement of her Ascending aorta we will plan to repeat CTA of the chest in 6 months to ensure she is not experiencing continued growth. ? ?Risk Modification: ? ?Statin:  yes ? ?Smoking cessation instruction/counseling given:  commended patient for quitting and reviewed strategies for preventing relapses ? ?Patient was counseled on importance of Blood Pressure Control.  Despite Medical intervention if the patient notices persistently elevated blood pressure readings.  They are instructed to contact their Primary Care Physician ? ?Please avoid use of Fluoroquinolones as this can potentially increase your risk of Aortic Rupture  and/or Dissection ? ?Patient educated on signs and symptoms of Aortic Dissection, handout also provided in AVS ? ?Ellwood Handler, PA-C ?02/23/22 ? ? ? ? ?

## 2022-02-23 NOTE — Patient Instructions (Signed)
Make every effort to maintain a "heart-healthy" lifestyle with regular physical exercise and adherence to a low-fat, low-carbohydrate diet.  Continue to seek regular follow-up appointments with your primary care physician and/or cardiologist. ? ?Do not resume smoking cigarettes or any other tobacco products. ? ? ?AVOID FLOUROQUINOLONES AS THEY CAN INCREASE RISK OF DISSECTION ?

## 2022-03-12 DIAGNOSIS — Z933 Colostomy status: Secondary | ICD-10-CM | POA: Diagnosis not present

## 2022-03-12 DIAGNOSIS — K5732 Diverticulitis of large intestine without perforation or abscess without bleeding: Secondary | ICD-10-CM | POA: Diagnosis not present

## 2022-03-17 ENCOUNTER — Telehealth: Payer: Self-pay

## 2022-03-17 NOTE — Telephone Encounter (Signed)
? ?  Pre-operative Risk Assessment  ?  ?Patient Name: Chelsea [name]  ?DOB: Sep 03, 1951 ?MRN: [id number]  ? ?  ? ?Request for Surgical Clearance   ? ?Procedure:   COLOSTOMY TAKE DOWN SURGERY ? ?Date of Surgery:  Clearance TBD                              ?   ?Surgeon:  Georganna Skeans, MD ?Surgeon's Group or Practice Name:  [facility] ?Phone number:  [phone] ?Fax number:  [phone], Charmella Little, CNA ?  ?Type of Clearance Requested:   ?- Medical  ?- Pharmacy:  Hold Aspirin How the patient should HOLD medication preoperatively. ?  ?Type of Anesthesia:  General  ?  ?Additional requests/questions:   ? ?Signed, ?Elias Dennington   ?03/17/2022, 1:50 PM  ? ?

## 2022-03-18 NOTE — Telephone Encounter (Signed)
Pt agreeable to plan of care for sooner appt 04/07/22 @ 1:55 with Richardson Dopp, PAC. Pt opts to keep the appt with Dr. Tamala Julian in June as well. I will forward notes to [facility] for upcoming appt. Will send FYI to requesting office the pt has sooner in office appt for pre op assessment.  ?

## 2022-03-18 NOTE — Telephone Encounter (Signed)
? ?  Name: Chelsea [name]  ?DOB: May 12, 1951  ?MRN: [id number] ? ?Primary Cardiologist: Sinclair Grooms, MD ? ?Chart reviewed as part of pre-operative protocol coverage. Because of Malay Fantroy Oddo's past medical history and time since last visit, she will require a follow-up in-office visit in order to better assess preoperative cardiovascular risk. ? ?At last OV 09/2021 she was seen for worsening dyspnea going up stairs. 2D echo 12/2021 suggested worsening of AI to moderate to severe range. She also had multiple blood pressure medications adjusted with recommendation for close f/u with Dr. Tamala Julian. She has an appointment in June but let's see if we can move this up with him to ensure she is optimized prior to surgery. ? ?Pre-op covering staff: ?- Please schedule appointment and call patient to inform them. If patient already had an upcoming appointment within acceptable timeframe, please add "pre-op clearance" to the appointment notes so provider is aware. ?- Please contact requesting surgeon's office via preferred method (i.e, phone, fax) to inform them of need for appointment prior to surgery. ? ?Will hold off routing question of holding ASA to MD since there do not appear to be any specific cardiac contraindications to holding, though patient has h/o stroke therefore would recommend surgeon review with PCP. ? ?Charlie Pitter, PA-C  ?03/18/2022, 10:17 AM  ? ?

## 2022-03-24 DIAGNOSIS — H25813 Combined forms of age-related cataract, bilateral: Secondary | ICD-10-CM | POA: Diagnosis not present

## 2022-03-25 DIAGNOSIS — H5213 Myopia, bilateral: Secondary | ICD-10-CM | POA: Diagnosis not present

## 2022-04-06 ENCOUNTER — Other Ambulatory Visit: Payer: Self-pay | Admitting: Internal Medicine

## 2022-04-06 DIAGNOSIS — Z01810 Encounter for preprocedural cardiovascular examination: Secondary | ICD-10-CM | POA: Insufficient documentation

## 2022-04-06 DIAGNOSIS — I719 Aortic aneurysm of unspecified site, without rupture: Secondary | ICD-10-CM | POA: Insufficient documentation

## 2022-04-06 DIAGNOSIS — I10 Essential (primary) hypertension: Secondary | ICD-10-CM

## 2022-04-06 NOTE — Progress Notes (Addendum)
?Cardiology Office Note:   ? ?Date:  04/07/2022  ? ?ID:  Chelsea [name], DOB 07/02/1951, MRN [id number] ? ?PCP:  Vanstory, Ashley N, PA  ?[facility] Providers ?Cardiologist:  Henry W Smith III, MD     ?Referring MD: Vanstory, Ashley N, PA  ? ?Chief Complaint:  Surgical clearance ?  ? ?Patient Profile: ?Aortic regurgitation ?Thoracic aortic aneurysm ?Hx CVA with residual left-sided weakness ?Hypertension ?Hyperlipidemia ?Chronic kidney disease ?Hx upper GI bleed ?Goiter ?Obesity  ? ?Prior CV Studies: ?Chest CTA 01/26/2022 ?Ascending thoracic aortic aneurysm 4.4 cm ? ?ECHO COMPLETE WO IMAGING ENHANCING AGENT 01/06/2022 ?EF 50, global HK, GRII DD, normal RVSF, estimated RVSP 41.4, moderate LAE, moderate MR, moderate TR, moderate to severe AI, ascending aorta 40 mm   ? ?History of Present Illness:   ?Chelsea [name] is a 71 y.o. female with the above problem list.  She was last seen by Dr. Smith in November 2022.   ? ?She was admitted in Dec 2022 with diverticulitis c/b perforated colon.  She underwent ex lap with partial colectomy and colostomy placement.  Post op course was notable for ileus, uncontrolled BP, paroxysmal atrial fibrillation with RVR, AKI.   ? ?She returns for surgical clearance.  She needs colostomy takedown with Dr. Thompson under general anesthesia.  She is here alone.  Overall, she has been doing well.  She walks with a walker given left-sided weakness from her stroke.  She has not had chest pain, shortness of breath, syncope, orthopnea, leg edema.  She is able to walk up and down her steps at home and do all the cleaning.     ?   ?Past Medical History:  ?Diagnosis Date  ? Anxiety   ? Aortic aneurysm without rupture (HCC)   ? followed by dr dr h. smith,  per echo 08/ 2021 44mm and chest CT 03/ 2021 4.0.cm  ? Arthritis   ? lower back  ? BPPV (benign paroxysmal positional vertigo)   ? Chronic back pain   ? CKD (chronic kidney disease), stage II   ? Depression   ? GERD (gastroesophageal reflux  disease)   ? Headache   ? Hemiparesis affecting left side as late effect of cerebrovascular accident (HCC)   ? Hiatal hernia   ? History of anemia   ? History of cerebrovascular accident (CVA) with residual deficit 03/2006  ? 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 History of gastric ulcer 2003  ? w/ upper gi bleed,  egd with no intervention needed  ? Hyperlipidemia   ? Hypertension   ? followed by cardiology--- dr h. smith and HTN clinic   (nuclear stress test 01-31-20121 normal w/ ef 54%; cardaic cath 08-07-2010 no sig coronary obstruction, preserved LVF, mild AR  ? Multinodular goiter   ? followed by pcp,  last ultrasound in epic 03-20-2020 , never met critiria for bx  ? Nonrheumatic aortic (valve) insufficiency   ? followed by cardiologist--- dr h. smith,  last echo in epic 08/ 2021 ef 60-65%, mild AR with ava 1.81cm^2 and mean gradient 11.0mmHg  ? OAB (overactive bladder)   ? urologist--- dr macdiarmid  ? Pneumonia   ? as a child  ? Spondylolisthesis, lumbar region   ? Stroke (HCC)   ? left side weakness 2007  ? Subcutaneous mass of back   ? upper back  ? Thalamic pain syndrome   ? secondary to acute right thalamic hypertensive hemorrhage   in 2007,  followed by dr athar  ? Urge incontinence   ? Wears glasses   ? Wears hearing aid in both ears   ? Wears partial dentures   ? upper and lower  ? ?Current Medications: ?Current Meds  ?Medication Sig  ? acetaminophen (TYLENOL) 500 MG tablet Take 2 tablets (1,000 mg total) by mouth every 6 (six) hours as needed.  ? amLODipine (NORVASC) 10 MG tablet Take 1 tablet (10 mg total) by mouth daily.  ? aspirin EC 81 MG tablet Take 1 tablet (81 mg total) by mouth daily.  ? atorvastatin (LIPITOR) 10 MG tablet TAKE 1 TABLET BY MOUTH  DAILY  ? chlorhexidine (PERIDEX) 0.12 % solution Use as directed 15 mLs in the mouth or throat 2 (two) times daily.  ? cholecalciferol (VITAMIN D) 25 MCG (1000 UNIT) tablet  Take 1,000 Units by mouth daily.  ? citalopram (CELEXA) 10 MG tablet Take 10 mg by mouth daily.  ? cloNIDine (CATAPRES) 0.3 MG tablet Take 0.3 mg by mouth 2 (two) times daily.  ? ezetimibe (ZETIA) 10 MG tablet Take 10 mg by mouth daily.  ? gabapentin (NEURONTIN) 800 MG tablet Take 1 tablet (800 mg total) by mouth 2 (two) times daily.  ? hydrALAZINE (APRESOLINE) 25 MG tablet Take 1 tablet (25 mg total) by mouth 3 (three) times daily.  ? methocarbamol (ROBAXIN) 750 MG tablet Take 1 tablet (750 mg total) by mouth 3 (three) times daily.  ? metoprolol succinate (TOPROL-XL) 50 MG 24 hr tablet Take 3 tablets (150 mg total) by mouth daily. Take with or immediately following a meal.  ? mirabegron ER (MYRBETRIQ) 25 MG TB24 tablet Take 25 mg by mouth daily.  ? Multiple Vitamin (MULTIVITAMIN) tablet Take 1 tablet by mouth daily.    ? nicotine (NICODERM CQ - DOSED IN MG/24 HOURS) 21 mg/24hr patch PLACE 1 PATCH (21 MG TOTAL) ONTO THE SKIN DAILY.  ? ondansetron (ZOFRAN) 8 MG tablet Take 8 mg by mouth 3 (three) times daily as needed for vomiting or nausea.  ? pantoprazole (PROTONIX) 40 MG tablet Take 40 mg by mouth daily.  ? promethazine (PHENERGAN) 12.5 MG tablet Take 12.5 mg by mouth every 6 (six) hours as needed.  ? spironolactone (ALDACTONE) 25 MG tablet Take 1 tablet (25 mg total) by mouth daily.  ? traMADol (ULTRAM) 50 MG tablet Take 1-2 tablets (50-100 mg total) by mouth every 6 (six) hours as needed for moderate pain.  ? vitamin B-12 (CYANOCOBALAMIN) 500 MCG tablet Take 500 mcg by mouth daily.  ? vitamin E 1000 UNIT capsule Take 1,000 Units by mouth daily.  ? [DISCONTINUED] indapamide (LOZOL) 2.5 MG tablet Take 1 tablet (2.5 mg total) by mouth daily.  ?  ?Allergies:   Other, Hydrocodone, and Oxycodone  ? ?Social History  ? ?Tobacco Use  ? Smoking status: Former  ?  Packs/day: 1.00  ?  Years: 50.00  ?  Pack years: 50.00  ?  Types: Cigarettes  ?  Quit date: 10/29/2021  ?  Years since quitting: 0.4  ? Smokeless tobacco: Never   ?Vaping Use  ? Vaping Use: Never used  ?Substance Use Topics  ? Alcohol use: No  ?  Alcohol/week: 0.0 standard drinks  ? Drug use: No  ?  ?Family Hx: ?The patient's family history includes Cancer in her brother; Cirrhosis in her father; Drug abuse in her brother; Heart attack in an other family member; Hypertension in her mother; Peripheral vascular disease in her mother. There is   no history of Breast cancer. ? ?ROS See HPI ? ?EKGs/Labs/Other Test Reviewed:   ? ?EKG:  EKG is   ordered today.  The ekg ordered today demonstrates NSR, HR 61, left axis deviation, LVH, QTc 434, no change from prior tracing ? ?Recent Labs: ?11/04/2021: ALT 11 ?11/13/2021: Magnesium 2.5; TSH 1.934 ?01/18/2022: BUN 13; Creatinine, Ser 1.15; Potassium 4.1; Sodium 143 ?02/12/2022: Hemoglobin 13.6; Platelet Count 359  ? ?Recent Lipid Panel ?No results for input(s): CHOL, TRIG, HDL, VLDL, LDLCALC, LDLDIRECT in the last 8760 hours.  ? ?Risk Assessment/Calculations:   ?  ?    ?Physical Exam:   ? ?VS:  BP (!) 160/80   Pulse 61   Ht 5' 7" (1.702 m)   Wt 169 lb 6.4 oz (76.8 kg)   SpO2 98%   BMI 26.53 kg/m?    ? ?Wt Readings from Last 3 Encounters:  ?04/07/22 169 lb 6.4 oz (76.8 kg)  ?02/23/22 168 lb 6.4 oz (76.4 kg)  ?02/12/22 165 lb 3.2 oz (74.9 kg)  ?  ?Constitutional:   ?   Appearance: Healthy appearance. Not in distress.  ?Neck:  ?   Vascular: JVD normal.  ?Pulmonary:  ?   Effort: Pulmonary effort is normal.  ?   Breath sounds: No wheezing. No rales.  ?Cardiovascular:  ?   Normal rate. Regular rhythm. Normal S1. Normal S2.   ?   Murmurs: There is a grade 2/4 diastolic murmur at the LLSB.  ?Edema: ?   Peripheral edema absent.  ?Abdominal:  ?   Palpations: Abdomen is soft.  ?Skin: ?   General: Skin is warm and dry.  ?Neurological:  ?   Mental Status: Alert and oriented to person, place and time.  ?   Cranial Nerves: Cranial nerves are intact.  ?   ?    ?ASSESSMENT & PLAN:   ?Preoperative cardiovascular examination ?Ms. Sunga's perioperative  risk of a major cardiac event is 6.6% according to the Revised Cardiac Risk Index (RCRI).  Therefore, she is at high risk for perioperative complications.   Her functional capacity is good at 5.07 METs accor

## 2022-04-07 ENCOUNTER — Encounter: Payer: Self-pay | Admitting: Physician Assistant

## 2022-04-07 ENCOUNTER — Ambulatory Visit (INDEPENDENT_AMBULATORY_CARE_PROVIDER_SITE_OTHER): Payer: Medicare Other | Admitting: Physician Assistant

## 2022-04-07 VITALS — BP 160/80 | HR 61 | Ht 67.0 in | Wt 169.4 lb

## 2022-04-07 DIAGNOSIS — I719 Aortic aneurysm of unspecified site, without rupture: Secondary | ICD-10-CM

## 2022-04-07 DIAGNOSIS — I10 Essential (primary) hypertension: Secondary | ICD-10-CM

## 2022-04-07 DIAGNOSIS — I351 Nonrheumatic aortic (valve) insufficiency: Secondary | ICD-10-CM

## 2022-04-07 DIAGNOSIS — Z01810 Encounter for preprocedural cardiovascular examination: Secondary | ICD-10-CM | POA: Diagnosis not present

## 2022-04-07 MED ORDER — HYDRALAZINE HCL 25 MG PO TABS: 25.0000 mg | ORAL_TABLET | Freq: Three times a day (TID) | ORAL | 3 refills | Status: DC

## 2022-04-07 MED ORDER — INDAPAMIDE 2.5 MG PO TABS: 2.5000 mg | ORAL_TABLET | Freq: Every day | ORAL | 11 refills | Status: DC

## 2022-04-07 NOTE — Assessment & Plan Note (Signed)
4.4 cm by CT in February 2023.  She is followed by CT surgery ?

## 2022-04-07 NOTE — Assessment & Plan Note (Signed)
Moderate to severe aortic insufficiency by echocardiogram in February 2023.  She is not having any significant symptoms related to this.  Dr. Tamala Julian had planned to repeat her echocardiogram again in November.  She has follow-up with him in June. ?

## 2022-04-07 NOTE — Patient Instructions (Signed)
Medication Instructions:  ?Your physician has recommended you make the following change in your medication:  ? START Hydralazine 25 mg taking 1 three times a day  ? ?*If you need a refill on your cardiac medications before your next appointment, please call your pharmacy* ? ? ?Lab Work: ?TODAY:  BMET ? ?If you have labs (blood work) drawn today and your tests are completely normal, you will receive your results only by: ?MyChart Message (if you have MyChart) OR ?A paper copy in the mail ?If you have any lab test that is abnormal or we need to change your treatment, we will call you to review the results. ? ? ?Testing/Procedures: ?None ordered ? ? ?Follow-Up: ?At [facility], you and your health needs are our priority.  As part of our continuing mission to provide you with exceptional heart care, we have created designated Provider Care Teams.  These Care Teams include your primary Cardiologist (physician) and Advanced Practice Providers (APPs -  Physician Assistants and Nurse Practitioners) who all work together to provide you with the care you need, when you need it. ? ?We recommend signing up for the patient portal called "MyChart".  Sign up information is provided on this After Visit Summary.  MyChart is used to connect with patients for Virtual Visits (Telemedicine).  Patients are able to view lab/test results, encounter notes, upcoming appointments, etc.  Non-urgent messages can be sent to your provider as well.   ?To learn more about what you can do with MyChart, go to [email or web address].   ? ?Your next appointment:   ?As Scheduled ? ?The format for your next appointment:   ?In Person ? ?Provider:   ?Sinclair Grooms, MD   ? ? ?Other Instructions ?Your physician has requested that you regularly monitor and record your blood pressure readings at home. Please use the same machine at the same time of day to check your readings and record them to bring to your follow-up visit. ?  ?Please monitor blood  pressures and keep a log of your readings for 1 week then call them into the office [phone].  ?  ?Make sure to check 2 hours after your medications.  ?  ?AVOID these things for 30 minutes before checking your blood pressure: ?No Drinking caffeine. ?No Drinking alcohol. ?No Eating. ?No Smoking. ?No Exercising. ?  ?Five minutes before checking your blood pressure: ?Pee. ?Sit in a dining chair. Avoid sitting in a soft couch or armchair. ?Be quiet. Do not talk ? ? ?Important Information About Sugar ? ? ? ? ?  ?

## 2022-04-07 NOTE — Assessment & Plan Note (Addendum)
Ms. Affinito perioperative risk of a major cardiac event is 6.6% according to the Revised Cardiac Risk Index (RCRI).  Therefore, she is at high risk for perioperative complications.   Her functional capacity is good at 5.07 METs according to the Duke Activity Status Index (DASI). ?Recommendations: ?According to ACC/AHA guidelines, no further cardiovascular testing needed.   ?However, her blood pressure is uncontrolled.  Also, she has moderate to severe aortic insufficiency on echocardiogram done in February.  She is not currently having symptoms related to aortic insufficiency.  I will make adjustments to help better control her blood pressure.  I would like to review her case further with her primary cardiologist Dr. Tamala Julian before providing final clearance for her upcoming procedure.  Further recommendations to follow. ?Antiplatelet and/or Anticoagulation Recommendations: ?Aspirin can be held for 7 days prior to her surgery.  Please resume Aspirin post operatively when it is felt to be safe from a bleeding standpoint.  ?

## 2022-04-07 NOTE — Assessment & Plan Note (Signed)
Blood pressure is uncontrolled.  I reviewed her chart while in the room with her.  In February, her creatinine increased.  She was taken off of valsartan at that time with improved creatinine.  Her blood pressures have been elevated at the last several visits.  Repeat blood pressure by me today was 160/80. ?? Continue amlodipine 10 mg daily, clonidine 0.3 mg twice daily, indapamide 2.5 mg daily, Toprol XL 150 mg daily, spironolactone 25 mg daily ?? Add hydralazine 25 mg 3 times a day ?? BMET today ?? Monitor blood pressure over 1 week and send readings for review ?? Consider renal arterial Dopplers in the future if blood pressure remains difficult to control ?

## 2022-04-08 ENCOUNTER — Telehealth: Payer: Self-pay

## 2022-04-08 DIAGNOSIS — I10 Essential (primary) hypertension: Secondary | ICD-10-CM

## 2022-04-08 LAB — BASIC METABOLIC PANEL
BUN/Creatinine Ratio: 13 (ref 12–28)
BUN: 15 mg/dL (ref 8–27)
CO2: 28 mmol/L (ref 20–29)
Calcium: 9.6 mg/dL (ref 8.7–10.3)
Chloride: 101 mmol/L (ref 96–106)
Creatinine, Ser: 1.19 mg/dL — ABNORMAL HIGH (ref 0.57–1.00)
Glucose: 88 mg/dL (ref 70–99)
Potassium: 3.4 mmol/L — ABNORMAL LOW (ref 3.5–5.2)
Sodium: 145 mmol/L — ABNORMAL HIGH (ref 134–144)
eGFR: 49 mL/min/{1.73_m2} — ABNORMAL LOW (ref >59)

## 2022-04-08 MED ORDER — POTASSIUM CHLORIDE ER 10 MEQ PO TBCR: 10.0000 meq | EXTENDED_RELEASE_TABLET | Freq: Every day | ORAL | 3 refills | Status: DC

## 2022-04-08 NOTE — Telephone Encounter (Signed)
The patient has been notified of the result and verbalized understanding.  All questions (if any) were answered. ?Antonieta Iba, RN 04/08/2022 11:25 AM  ?Patient reports that she is taking spironolactone. She will add on potassium once daily. Repeat labs have been scheduled.  ?

## 2022-04-08 NOTE — Telephone Encounter (Signed)
-----   Message from Liliane Shi, [place] sent at 04/08/2022  7:50 AM EDT ----- ?Creatinine stable.  K+ low. ?PLAN:  ?-Make sure pt has been taking spironolactone ?-If taking spironolactone, add K+ 10 mEq once daily  ?-If not taking spironolactone, restart spironolactone 25 mg once daily  ?-BMET 1 week ?Richardson Dopp, PA-C    ?04/08/2022 7:48 AM   ?

## 2022-04-09 ENCOUNTER — Telehealth: Payer: Self-pay | Admitting: Interventional Cardiology

## 2022-04-09 NOTE — Telephone Encounter (Signed)
Pt c/o BP issue: STAT if pt c/o blurred vision, one-sided weakness or slurred speech ? ?1. What are your last 5 BP readings? 138/80 ?HR 79 ? ?2. Are you having any other symptoms (ex. Dizziness, headache, blurred vision, passed out)? No ? ?3. What is your BP issue? Wanting call back in regards to her BP and needing advice. Requesting a call back. She states her medicine has brought it down.   ?

## 2022-04-09 NOTE — Telephone Encounter (Signed)
Spoke with patient regarding her BP. ? ?Patient states she was calling to report her BP after medication change from last office visit. She states her BP today was 138/80, HR 79. This is a much improved BP reading compared to the one obtained at last OV on 04/07/22. ? ?Encouraged patient to continue monitoring BP at home and write them down to bring in when she comes for lab work on 04/14/22. ? ?Patient verbalized understanding. ?

## 2022-04-12 DIAGNOSIS — K5732 Diverticulitis of large intestine without perforation or abscess without bleeding: Secondary | ICD-10-CM | POA: Diagnosis not present

## 2022-04-12 DIAGNOSIS — Z933 Colostomy status: Secondary | ICD-10-CM | POA: Diagnosis not present

## 2022-04-12 NOTE — Telephone Encounter (Signed)
Great! ?Richardson Dopp, PA-C    ?04/12/2022 7:27 AM   ?

## 2022-04-12 NOTE — Telephone Encounter (Signed)
Notes faxed to surgeon. ?Richardson Dopp, PA-C    ?04/12/2022 7:34 AM   ?

## 2022-04-14 ENCOUNTER — Other Ambulatory Visit: Payer: Medicare Other

## 2022-04-14 DIAGNOSIS — I10 Essential (primary) hypertension: Secondary | ICD-10-CM

## 2022-04-14 LAB — BASIC METABOLIC PANEL
BUN/Creatinine Ratio: 13 (ref 12–28)
BUN: 17 mg/dL (ref 8–27)
CO2: 28 mmol/L (ref 20–29)
Calcium: 9.4 mg/dL (ref 8.7–10.3)
Chloride: 103 mmol/L (ref 96–106)
Creatinine, Ser: 1.26 mg/dL — ABNORMAL HIGH (ref 0.57–1.00)
Glucose: 87 mg/dL (ref 70–99)
Potassium: 4 mmol/L (ref 3.5–5.2)
Sodium: 140 mmol/L (ref 134–144)
eGFR: 46 mL/min/{1.73_m2} — ABNORMAL LOW (ref >59)

## 2022-04-16 ENCOUNTER — Telehealth: Payer: Self-pay | Admitting: *Deleted

## 2022-04-16 ENCOUNTER — Encounter (HOSPITAL_COMMUNITY): Payer: Self-pay | Admitting: Emergency Medicine

## 2022-04-16 ENCOUNTER — Other Ambulatory Visit: Payer: Self-pay

## 2022-04-16 ENCOUNTER — Emergency Department (HOSPITAL_COMMUNITY)
Admission: EM | Admit: 2022-04-16 | Discharge: 2022-04-17 | Disposition: A | Discharge status: Home / Self Care | Payer: Medicare Other | Attending: Emergency Medicine | Admitting: Emergency Medicine

## 2022-04-16 DIAGNOSIS — R6889 Other general symptoms and signs: Secondary | ICD-10-CM | POA: Diagnosis not present

## 2022-04-16 DIAGNOSIS — K922 Gastrointestinal hemorrhage, unspecified: Secondary | ICD-10-CM | POA: Insufficient documentation

## 2022-04-16 DIAGNOSIS — K9401 Colostomy hemorrhage: Secondary | ICD-10-CM | POA: Diagnosis present

## 2022-04-16 DIAGNOSIS — Z743 Need for continuous supervision: Secondary | ICD-10-CM | POA: Diagnosis not present

## 2022-04-16 DIAGNOSIS — Z7982 Long term (current) use of aspirin: Secondary | ICD-10-CM | POA: Diagnosis not present

## 2022-04-16 DIAGNOSIS — R58 Hemorrhage, not elsewhere classified: Secondary | ICD-10-CM | POA: Diagnosis not present

## 2022-04-16 LAB — CBC WITH DIFFERENTIAL/PLATELET
Abs Immature Granulocytes: 0.01 10*3/uL (ref 0.00–0.07)
Basophils Absolute: 0 10*3/uL (ref 0.0–0.1)
Basophils Relative: 0 %
Eosinophils Absolute: 0.2 10*3/uL (ref 0.0–0.5)
Eosinophils Relative: 7 %
HCT: 43.2 % (ref 36.0–46.0)
Hemoglobin: 13.3 g/dL (ref 12.0–15.0)
Immature Granulocytes: 0 %
Lymphocytes Relative: 28 %
Lymphs Abs: 1 10*3/uL (ref 0.7–4.0)
MCH: 28.2 pg (ref 26.0–34.0)
MCHC: 30.8 g/dL (ref 30.0–36.0)
MCV: 91.7 fL (ref 80.0–100.0)
Monocytes Absolute: 0.4 10*3/uL (ref 0.1–1.0)
Monocytes Relative: 11 %
Neutro Abs: 1.9 10*3/uL (ref 1.7–7.7)
Neutrophils Relative %: 54 %
Platelets: 267 10*3/uL (ref 150–400)
RBC: 4.71 MIL/uL (ref 3.87–5.11)
RDW: 14.4 % (ref 11.5–15.5)
WBC: 3.5 10*3/uL — ABNORMAL LOW (ref 4.0–10.5)
nRBC: 0 % (ref 0.0–0.2)

## 2022-04-16 LAB — COMPREHENSIVE METABOLIC PANEL
ALT: 12 U/L (ref 0–44)
AST: 18 U/L (ref 15–41)
Albumin: 3.5 g/dL (ref 3.5–5.0)
Alkaline Phosphatase: 88 U/L (ref 38–126)
Anion gap: 9 (ref 5–15)
BUN: 14 mg/dL (ref 8–23)
CO2: 27 mmol/L (ref 22–32)
Calcium: 9 mg/dL (ref 8.9–10.3)
Chloride: 105 mmol/L (ref 98–111)
Creatinine, Ser: 1.28 mg/dL — ABNORMAL HIGH (ref 0.44–1.00)
GFR, Estimated: 45 mL/min — ABNORMAL LOW (ref >60)
Glucose, Bld: 109 mg/dL — ABNORMAL HIGH (ref 70–99)
Potassium: 3.6 mmol/L (ref 3.5–5.1)
Sodium: 141 mmol/L (ref 135–145)
Total Bilirubin: 0.4 mg/dL (ref 0.3–1.2)
Total Protein: 7.3 g/dL (ref 6.5–8.1)

## 2022-04-16 NOTE — Telephone Encounter (Signed)
Call placed to pt regarding some blood pressure readings she had dropped off 04/14/22.  Per Richardson Dopp, PA-C, blood pressures look good and to continue current medications.  Pt verbalized understanding.  Will send readings to be scanned into pt's chart.

## 2022-04-16 NOTE — ED Provider Triage Note (Signed)
  Emergency Medicine Provider Triage Evaluation Note  MRN:  [id number]  Arrival date & time: 04/16/22    Medically screening exam initiated at 10:51 PM.   CC:   Blood in colostomy bag    HPI:  Chelsea [name] is a 71 y.o. year-old female presents to the ED with chief complaint of bleeding from ostomy bag.  Started tonight.  Denies hx of the same.  Denies any trauma.  Denies any pain, fever, or vomiting.  Not anticoagulated.Marland Kitchen  History provided by  ROS:  -As included in HPI PE:  There were no vitals filed for this visit.  Non-toxic appearing No respiratory distress Blood in colostomy bag, abdomen non-tender MDM:  Based on signs and symptoms, upper GI bleed is highest on my differential. I've ordered labs in triage to expedite lab/diagnostic workup.  Patient was informed that the remainder of the evaluation will be completed by another provider, this initial triage assessment does not replace that evaluation, and the importance of remaining in the ED until their evaluation is complete.    Montine Circle, PA-C 04/16/22 2252

## 2022-04-16 NOTE — ED Triage Notes (Signed)
Patient arrived with EMS from home reports blood inside her colostomy bag this evening , denies abdominal pain , no emesis or fever .

## 2022-04-17 DIAGNOSIS — K922 Gastrointestinal hemorrhage, unspecified: Secondary | ICD-10-CM | POA: Diagnosis not present

## 2022-04-17 LAB — POC OCCULT BLOOD, ED: Fecal Occult Bld: POSITIVE — AB

## 2022-04-17 LAB — TYPE AND SCREEN
ABO/RH(D): O POS
Antibody Screen: NEGATIVE

## 2022-04-17 NOTE — ED Notes (Signed)
Patient verbalizes understanding of discharge instructions. Opportunity for questioning and answers were provided. Armband removed by staff, pt discharged from ED to home via POV  

## 2022-04-17 NOTE — ED Notes (Signed)
EDP at bedside  

## 2022-04-18 NOTE — ED Provider Notes (Signed)
[facility] EMERGENCY DEPARTMENT Provider Note   CSN: [id number] Arrival date & time: 04/16/22  2233     History  Chief Complaint  Patient presents with   Blood in colostomy bag     Chelsea [name] is a 71 y.o. female.  71 yo F here with blood in her ostomy. Started earlier [employer]. Asymptomatic otherwise. No dyspnea, fatigue, weakness. No h/o same. Scheduled for reversal next month. Improving now.        Home Medications Prior to Admission medications   Medication Sig Start Date End Date Taking? Authorizing Provider  acetaminophen (TYLENOL) 500 MG tablet Take 2 tablets (1,000 mg total) by mouth every 6 (six) hours as needed. 11/13/21   Saverio Danker, PA-C  amLODipine (NORVASC) 10 MG tablet TAKE 1 TABLET (10 MG TOTAL) BY MOUTH DAILY FOR BLOOD PRESSURE 04/12/22   Belva Crome, MD  aspirin EC 81 MG tablet Take 1 tablet (81 mg total) by mouth daily. 04/11/17   Belva Crome, MD  atorvastatin (LIPITOR) 10 MG tablet TAKE 1 TABLET BY MOUTH  DAILY 10/08/21   Belva Crome, MD  chlorhexidine (PERIDEX) 0.12 % solution Use as directed 15 mLs in the mouth or throat 2 (two) times daily. 05/29/20   [provider]  cholecalciferol (VITAMIN D) 25 MCG (1000 UNIT) tablet Take 1,000 Units by mouth daily.    [provider]  citalopram (CELEXA) 10 MG tablet Take 10 mg by mouth daily.    [provider]  cloNIDine (CATAPRES) 0.3 MG tablet Take 0.3 mg by mouth 2 (two) times daily. 02/20/21   [provider]  ezetimibe (ZETIA) 10 MG tablet Take 10 mg by mouth daily.    [provider]  gabapentin (NEURONTIN) 800 MG tablet Take 1 tablet (800 mg total) by mouth 2 (two) times daily. 08/11/21   Ward Givens, NP  hydrALAZINE (APRESOLINE) 25 MG tablet Take 1 tablet (25 mg total) by mouth 3 (three) times daily. 04/07/22   Richardson Dopp T, PA-C  indapamide (LOZOL) 2.5 MG tablet Take 1 tablet (2.5 mg total) by mouth daily. 04/07/22   Richardson Dopp T, PA-C  methocarbamol (ROBAXIN) 750 MG tablet Take 1 tablet (750 mg total) by mouth 3 (three) times daily. 11/13/21   Saverio Danker, PA-C  metoprolol succinate (TOPROL-XL) 50 MG 24 hr tablet Take 3 tablets (150 mg total) by mouth daily. Take with or immediately following a meal. 11/14/21   Virl Axe, MD  mirabegron ER (MYRBETRIQ) 25 MG TB24 tablet Take 25 mg by mouth daily.    [provider]  Multiple Vitamin (MULTIVITAMIN) tablet Take 1 tablet by mouth daily.      [provider]  nicotine (NICODERM CQ - DOSED IN MG/24 HOURS) 21 mg/24hr patch PLACE 1 PATCH (21 MG TOTAL) ONTO THE SKIN DAILY. 10/01/21   Belva Crome, MD  ondansetron (ZOFRAN) 8 MG tablet Take 8 mg by mouth 3 (three) times daily as needed for vomiting or nausea. 08/31/21   [provider]  pantoprazole (PROTONIX) 40 MG tablet Take 40 mg by mouth daily. 07/28/16   [provider]  potassium chloride (KLOR-CON) 10 MEQ tablet Take 1 tablet (10 mEq total) by mouth daily. 04/08/22   Richardson Dopp T, PA-C  promethazine (PHENERGAN) 12.5 MG tablet Take 12.5 mg by mouth every 6 (six) hours as needed. 01/11/22   [provider]  spironolactone (ALDACTONE) 25 MG tablet Take 1 tablet (25 mg total) by mouth daily.  10/06/21   Belva Crome, MD  traMADol (ULTRAM) 50 MG tablet Take 1-2 tablets (50-100 mg total) by mouth every 6 (six) hours as needed for moderate pain. 11/13/21   Saverio Danker, PA-C  vitamin B-12 (CYANOCOBALAMIN) 500 MCG tablet Take 500 mcg by mouth daily.    [provider]  vitamin E 1000 UNIT capsule Take 1,000 Units by mouth daily.    [provider]      Allergies    Other, Hydrocodone, and Oxycodone    Review of Systems   Review of Systems  Physical Exam Updated Vital Signs BP (!) 126/93   Pulse 61   Temp 98.4 F (36.9 C)   Resp 15   SpO2 99%  Physical Exam Vitals and nursing note reviewed.  Constitutional:      Appearance: She is  well-developed.  HENT:     Head: Normocephalic and atraumatic.     Mouth/Throat:     Mouth: Mucous membranes are moist.     Pharynx: Oropharynx is clear.  Eyes:     Pupils: Pupils are equal, round, and reactive to light.  Cardiovascular:     Rate and Rhythm: Normal rate and regular rhythm.  Pulmonary:     Effort: No respiratory distress.     Breath sounds: No stridor.  Abdominal:     General: Abdomen is flat. There is no distension.     Comments: Blood streaked brown stool in ostomy  Musculoskeletal:        General: No swelling or tenderness. Normal range of motion.     Cervical back: Normal range of motion.  Skin:    General: Skin is warm and dry.  Neurological:     General: No focal deficit present.     Mental Status: She is alert.    ED Results / Procedures / Treatments   Labs (all labs ordered are listed, but only abnormal results are displayed) Labs Reviewed  COMPREHENSIVE METABOLIC PANEL - Abnormal; Notable for the following components:      Result Value   Glucose, Bld 109 (*)    Creatinine, Ser 1.28 (*)    GFR, Estimated 45 (*)    All other components within normal limits  CBC WITH DIFFERENTIAL/PLATELET - Abnormal; Notable for the following components:   WBC 3.5 (*)    All other components within normal limits  POC OCCULT BLOOD, ED - Abnormal; Notable for the following components:   Fecal Occult Bld POSITIVE (*)    All other components within normal limits  TYPE AND SCREEN    EKG None  Radiology No results found.  Procedures Procedures    Medications Ordered in ED Medications - No data to display  ED Course/ Medical Decision Making/ A&P                           Medical Decision Making  Observed for a few hours without recurrent bleeding. Hemoglobin normal. Stable VS, no reason for imaging or further workup at this time. Will FU w/ surgery/GI PRN for same. Return here for any recurrent or worsening symptoms.   Final Clinical Impression(s) / ED  Diagnoses Final diagnoses:  Gastrointestinal hemorrhage, unspecified gastrointestinal hemorrhage type    Rx / DC Orders ED Discharge Orders     None         Chase Arnall, Corene Cornea, MD 04/18/22 [phone]

## 2022-04-21 ENCOUNTER — Ambulatory Visit: Payer: Self-pay | Admitting: General Surgery

## 2022-04-21 DIAGNOSIS — H524 Presbyopia: Secondary | ICD-10-CM | POA: Diagnosis not present

## 2022-05-09 NOTE — Progress Notes (Unsigned)
Cardiology Office Note:    Date:  05/10/2022   ID:  Chelsea [name], DOB [birth year], MRN [id number]  PCP:  Trey Sailors, PA  Cardiologist:  Sinclair Grooms, MD   Referring MD: Trey Sailors, [place]   Chief Complaint  Patient presents with   Cardiac Valve Problem    Moderately severe aortic regurgitation   Advice Only    Cardiac clearance for upcoming noncardiac surgery    History of Present Illness:    Chelsea [name] is a 71 y.o. female with a hx of  mild aortic regurgitation, non-cardiac chest pain, prior history of stroke with residual left-sided weakness, hyperlipidemia, hypertension, and moderate obesity.   She is doing well from CV standpoint.  There is no chest discomfort, orthopnea, PND, limitation in exertional tolerance, edema, or angina.  She has not had palpitations or syncope.  She does have moderately severe aortic regurgitation with a small ascending aortic aneurysm.  LVEF is in the low normal range of 50%.  She is desperately wanting to have the colostomy repaired.  This will be done by Dr. Georganna Skeans next week.  Past Medical History:  Diagnosis Date   Anxiety    Aortic aneurysm without rupture [facility])    followed by dr dr h. 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 [facility]   (nuclear stress test 01-31-20121 normal w/ ef 54%; cardaic cath 08-07-2010 no sig coronary obstruction, preserved LVF, mild AR   Multinodular goiter    followed by pcp,  last ultrasound in epic 03-20-2020 , never met critiria for bx   Nonrheumatic aortic (valve) insufficiency    followed by cardiologist--- dr h. 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,  followed by dr atRexene Alberts Urge incontinence    Wears glasses    Wears hearing aid in both ears    Wears partial dentures    upper and lower    Past Surgical History:  Procedure Laterality Date   ABDOMINAL HYSTERECTOMY     CARDIAC CATHETERIZATION  08/07/2010   [facility]  no sig coronary obstruction, preserved LVF, mild AR   COLON RESECTION SIGMOID N/A 11/08/2021   Procedure: COLON RESECTION SIGMOID; DRAINAGE  OF INTRAABDOMINAL ABSCESS;  Surgeon: Georganna Skeans, MD;  Location: [place];  Service: General;  Laterality: N/A;   COLOSTOMY N/A 11/08/2021   Procedure: COLOSTOMY;  Surgeon: Georganna Skeans, MD;  Location: [place];  Service: General;  Laterality: N/A;   MASS EXCISION N/A 06/04/2021   Procedure: EXCISION SUCUTANEOUS MASS X2, UPPER BACK;  Surgeon: Clovis Riley, MD;  Location: [place];  Service: General;  Laterality: N/A;   TOTAL KNEE ARTHROPLASTY Left 01/05/2016   Procedure: TOTAL KNEE ARTHROPLASTY;  Surgeon: Frederik Pear, MD;  Location: [place];  Service: Orthopedics;  Laterality: Left;   TUBAL LIGATION Bilateral 2000    Current Medications: Current Meds  Medication Sig   amLODipine (NORVASC) 10 MG tablet TAKE 1 TABLET (10 MG  TOTAL) BY MOUTH DAILY FOR BLOOD PRESSURE (Patient taking differently: Take 10 mg by mouth daily as needed (elevated bp 180/110).)   Ascorbic Acid (VITAMIN C) 1000 MG tablet Take 1,000 mg by mouth in the morning.   aspirin EC 81 MG tablet Take 1 tablet (81 mg total) by mouth daily.   Aspirin-Acetaminophen-Caffeine (GOODYS EXTRA STRENGTH) 500-325-65 MG PACK Take 1 packet by mouth daily as needed (pain.).   atorvastatin (LIPITOR) 10 MG tablet TAKE 1 TABLET BY MOUTH  DAILY (Patient taking differently: Take 10 mg by mouth every evening.)   b complex vitamins capsule Take 1 capsule by mouth in the morning.   B Complex-C (B-COMPLEX WITH VITAMIN C) tablet Take 1 tablet by mouth in the morning.   Biotin 5000 MCG TABS Take 5,000 mcg by mouth in the morning.   chlorhexidine (PERIDEX) 0.12 % solution Use as directed 15 mLs in the mouth or throat daily.   cholecalciferol (VITAMIN D) 25 MCG (1000 UNIT) tablet Take 1,000 Units by mouth daily as needed (with vertigo).   citalopram (CELEXA) 20 MG tablet Take 20 mg by mouth in the morning and at bedtime.   cloNIDine (CATAPRES) 0.3 MG tablet Take 0.3 mg by mouth 2 (two) times daily.   doxazosin (CARDURA) 8 MG tablet Take 8 mg by mouth daily.   ezetimibe (ZETIA) 10 MG tablet Take 10 mg by mouth 2 (two) times a week.   Ferrous Gluconate (IRON 27 PO) Take by mouth in the morning.   gabapentin (NEURONTIN) 800 MG tablet Take 1 tablet (800 mg total) by mouth 2 (two) times daily. (Patient taking differently: Take 400 mg by mouth 2 (two) times daily as needed (nerve pain.).)   hydrALAZINE (APRESOLINE) 25 MG tablet Take 1 tablet (25 mg total) by mouth 3 (three) times daily.   indapamide (LOZOL) 2.5 MG tablet Take 1 tablet (2.5 mg total) by mouth daily.   meclizine (ANTIVERT) 25 MG tablet Take 25 mg by mouth 3 (three) times daily as needed (vertigo).   methocarbamol (ROBAXIN) 750 MG tablet Take 1 tablet (750 mg total) by mouth 3 (three) times daily. (Patient taking  differently: Take 750 mg by mouth every 8 (eight) hours as needed for muscle spasms.)   metoprolol succinate (TOPROL-XL) 100 MG 24 hr tablet Take 50 mg by mouth in the morning. Take with or immediately following a meal.   metoprolol succinate (TOPROL-XL) 50 MG 24 hr tablet Take 3 tablets (150 mg total) by mouth daily. Take with or immediately following a meal.   mirabegron ER (MYRBETRIQ) 25 MG TB24 tablet Take 25 mg by mouth in the morning.   Multiple Vitamin (MULTIVITAMIN WITH MINERALS) TABS tablet Take 1 tablet by mouth in the morning.   nicotine (  NICODERM CQ - DOSED IN MG/24 HOURS) 21 mg/24hr patch PLACE 1 PATCH (21 MG TOTAL) ONTO THE SKIN DAILY.   ondansetron (ZOFRAN) 8 MG tablet Take 8 mg by mouth 3 (three) times daily as needed for vomiting or nausea.   pantoprazole (PROTONIX) 40 MG tablet Take 40 mg by mouth daily before breakfast.   potassium chloride (KLOR-CON) 10 MEQ tablet Take 1 tablet (10 mEq total) by mouth daily.   spironolactone (ALDACTONE) 25 MG tablet Take 1 tablet (25 mg total) by mouth daily.   traMADol (ULTRAM) 50 MG tablet Take 1-2 tablets (50-100 mg total) by mouth every 6 (six) hours as needed for moderate pain. (Patient taking differently: Take 50 mg by mouth every 8 (eight) hours as needed (pain.).)   Vitamin A 2400 MCG (8000 UT) CAPS Take 2,400 mcg by mouth in the morning.   vitamin B-12 (CYANOCOBALAMIN) 1000 MCG tablet Take 1,000 mcg by mouth in the morning.   vitamin E 180 MG (400 UNITS) capsule Take 400 Units by mouth in the morning.   [DISCONTINUED] doxazosin (CARDURA) 1 MG tablet Take 1 mg by mouth in the morning.     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  Tobacco Use   Smoking status: Former    Packs/day: 1.00    Years: 50.00    Total pack years: 50.00    Types: Cigarettes    Quit date: 10/29/2021    Years since quitting: 0.5   Smokeless tobacco: Never  Vaping Use   Vaping Use: Never used  Substance and Sexual Activity   Alcohol use: No    Alcohol/week: 0.0 standard drinks of alcohol   Drug use: No   Sexual activity: Never  Other Topics Concern   Not on file  Social History Narrative   Patient consumes no caffeine   Social Determinants of Health   Financial Resource Strain: Not on file  Food Insecurity: Not on file  Transportation Needs: Not on file  Physical Activity: Not on file  Stress: Not on file  Social Connections: Not on file     Family History: The patient's family history includes Cancer in her brother; Cirrhosis in her father; Drug abuse in her brother; Heart attack in an other family member; Hypertension in her mother; Peripheral vascular disease in her mother. There is no history of Breast cancer.  ROS:   Please see the history of present illness.    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  All other systems reviewed and are negative.  EKGs/Labs/Other Studies Reviewed:    The following studies were reviewed today:  2 D Doppler ECHOCARDIOGRAN 2023, February. IMPRESSIONS   1. Left ventricular ejection fraction, by estimation, is 50%. The left  ventricle has mildly decreased function. The left ventricle demonstrates  global hypokinesis. Left ventricular diastolic parameters are consistent  with Grade II diastolic dysfunction  (pseudonormalization).   2. Right ventricular systolic function is normal. The right ventricular  size is normal. There is mildly elevated pulmonary artery systolic  pressure. The estimated right ventricular systolic pressure is 09.7 mmHg.   3. Left atrial size was moderately  dilated.   4. The mitral valve is normal in structure. Moderate  mitral valve  regurgitation. No evidence of mitral stenosis.   5. The tricuspid valve is abnormal. Tricuspid valve regurgitation is  moderate.   6. The aortic valve is tricuspid. Aortic valve regurgitation is moderate  to severe. Not severe by pressure halftime, but vena contracta width is  0.55-0.6 cm. No holodiastolic flow reversal in the descending thoracic  aorta. No aortic stenosis is present.   7. Aortic dilatation noted. There is mild dilatation of the visualized  ascending aorta, measuring 40 mm (the ascending aorta was incompletely  visualized, suggest CTA or MRA to assess the full thoracic aorta).   8. The inferior vena cava is normal in size with greater than 50%  respiratory variability, suggesting right atrial pressure of 3 mmHg.    CHEST CT 01/26/2022: IMPRESSION: Vascular:   1. Interval increased size of fusiform ascending thoracic aortic aneurysm which now measures up to 4.4 cm, previously 4.0 cm. Recommend annual imaging followup by CTA or MRA. This recommendation follows 2010 ACCF/AHA/AATS/ACR/ASA/SCA/SCAI/SIR/STS/SVM Guidelines for the Diagnosis and Management of Patients with Thoracic Aortic Disease. Circulation. 2010; 121: Z610-R604. Aortic aneurysm NOS (ICD10-I71.9) 2. Unchanged global cardiomegaly.   Non-Vascular:   1. No acute intrathoracic abnormality. 2. Cholelithiasis.   Ruthann Cancer, MD    EKG:  EKG most recent electrocardiogram is performed Apr 07, 2022 and demonstrates left ventricular hypertrophy, poor R wave progression V1 through V4, and nonspecific T wave abnormality.  Recent Labs: 11/13/2021: Magnesium 2.5; TSH 1.934 04/16/2022: ALT 12; BUN 14; Creatinine, Ser 1.28; Hemoglobin 13.3; Platelets 267; Potassium 3.6; Sodium 141  Recent Lipid Panel    Component Value Date/Time   CHOL 134 04/20/2016 1409   TRIG 99 04/20/2016 1409   HDL 54 04/20/2016 1409   CHOLHDL 2.5 04/20/2016 1409   VLDL 20 04/20/2016 1409   LDLCALC 60 04/20/2016 1409     Physical Exam:    VS:  BP (!) 150/80   Pulse 88   Ht 5' 7"  (1.702 m)   Wt 168 lb 9.6 oz (76.5 kg)   SpO2 98%   BMI 26.41 kg/m     Wt Readings from Last 3 Encounters:  05/10/22 168 lb 9.6 oz (76.5 kg)  04/07/22 169 lb 6.4 oz (76.8 kg)  02/23/22 168 lb 6.4 oz (76.4 kg)     GEN: Healthy.  Uses a rolling walker for support.. No acute distress HEENT: Normal NECK: No JVD. LYMPHATICS: No lymphadenopathy CARDIAC: 2/6 to 3/6 decrescendo right upper sternal diastolic and 2/6 to 3/6 crescendo decrescendo right upper sternal systolic murmur. RRR no gallop, or edema. VASCULAR:  Normal Pulses. No bruits. RESPIRATORY:  Clear to auscultation without rales, wheezing or rhonchi  ABDOMEN: Soft, non-tender, non-distended, No pulsatile mass, MUSCULOSKELETAL: No deformity  SKIN: Warm and dry NEUROLOGIC:  Alert and oriented x 3 PSYCHIATRIC:  Normal affect   ASSESSMENT:    1. Preoperative clearance   2. Aortic aneurysm without rupture, unspecified portion of aorta ([place])   3. Nonrheumatic aortic valve insufficiency   4. Other hyperlipidemia   5. Primary hypertension   6. Morbid obesity ([place])   7. Tobacco abuse   8. Colostomy complication (HCC)    PLAN:    In order of problems listed above:  No clinical signs or features of volume overload/CHF.  She is cleared for upcoming takedown of colostomy.  Her risk will be low.  She could get into trouble if excessive IV fluid is administered.  She has valvular heart disease with significant aortic regurgitation and low normal LV function. Aortic dilatation is noted. Moderate to moderately severe aortic regurgitation with low normal LV function.  We need to move towards aortic valve replacement.  Repeat echocardiogram in August followed by work-up for elective aortic root and valve replacement. Continue risk factor modification including Lipitor, blood pressure control, weight control, etc. Systolic pressure is elevated related to aortic  regurgitation. No comments Encouraged to discontinue smoking.  She has abstained now for quite some time. Proceed with colostomy takedown by Dr. Georganna Skeans next week.   Aortic regurgitation without evidence of heart failure.  Patient is functionally normal.  I do believe she can undergo the upcoming general anesthesia and colostomy takedown without much trouble.  We will start evaluating for elective aortic valve replacement in the fall.  An echocardiogram will be repeated in August/September timeframe.  She is to call if shortness of breath, edema, or any clinical symptoms suggestive of heart failure.   Medication Adjustments/Labs and Tests Ordered: Current medicines are reviewed at length with the patient today.  Concerns regarding medicines are outlined above.  Orders Placed This Encounter  Procedures   ECHOCARDIOGRAM COMPLETE   No orders of the defined types were placed in this encounter.   Patient Instructions  Medication Instructions:  Your physician recommends that you continue on your current medications as directed. Please refer to the Current Medication list given to you today.  *If you need a refill on your cardiac medications before your next appointment, please call your pharmacy*  Lab Work: NONE  Testing/Procedures: Your physician has requested that you have an echocardiogram in August 2023. Echocardiography is a painless test that uses sound waves to create images of your heart. It provides your doctor with information about the size and shape of your heart and how well your heart's chambers and valves are working. This procedure takes approximately one hour. There are no restrictions for this procedure.  Follow-Up: At [facility], you and your health needs are our priority.  As part of our continuing mission to provide you with exceptional heart care, we have created designated Provider Care Teams.  These Care Teams include your primary Cardiologist (physician)  and Advanced Practice Providers (APPs -  Physician Assistants and Nurse Practitioners) who all work together to provide you with the care you need, when you need it.  Your next appointment:   2-3 month(s) (August/September 2023 after echo)  The format for your next appointment:   In Person  Provider:   Sinclair Grooms, MD {   Important Information About Sugar         Signed, Sinclair Grooms, MD  05/10/2022 3:58 PM    [place]

## 2022-05-10 ENCOUNTER — Ambulatory Visit (INDEPENDENT_AMBULATORY_CARE_PROVIDER_SITE_OTHER): Payer: Medicare Other | Admitting: Interventional Cardiology

## 2022-05-10 ENCOUNTER — Encounter: Payer: Self-pay | Admitting: Interventional Cardiology

## 2022-05-10 ENCOUNTER — Inpatient Hospital Stay (HOSPITAL_COMMUNITY): Admission: RE | Admit: 2022-05-10 | Payer: Medicare Other | Source: Ambulatory Visit

## 2022-05-10 VITALS — BP 150/80 | HR 88 | Ht 67.0 in | Wt 168.6 lb

## 2022-05-10 DIAGNOSIS — I351 Nonrheumatic aortic (valve) insufficiency: Secondary | ICD-10-CM

## 2022-05-10 DIAGNOSIS — I719 Aortic aneurysm of unspecified site, without rupture: Secondary | ICD-10-CM

## 2022-05-10 DIAGNOSIS — I10 Essential (primary) hypertension: Secondary | ICD-10-CM

## 2022-05-10 DIAGNOSIS — E7849 Other hyperlipidemia: Secondary | ICD-10-CM | POA: Diagnosis not present

## 2022-05-10 DIAGNOSIS — K9400 Colostomy complication, unspecified: Secondary | ICD-10-CM

## 2022-05-10 DIAGNOSIS — Z720 Tobacco use: Secondary | ICD-10-CM

## 2022-05-10 DIAGNOSIS — Z01818 Encounter for other preprocedural examination: Secondary | ICD-10-CM

## 2022-05-10 NOTE — Patient Instructions (Addendum)
Medication Instructions:  Your physician recommends that you continue on your current medications as directed. Please refer to the Current Medication list given to you today.  *If you need a refill on your cardiac medications before your next appointment, please call your pharmacy*  Lab Work: NONE  Testing/Procedures: Your physician has requested that you have an echocardiogram in August 2023. Echocardiography is a painless test that uses sound waves to create images of your heart. It provides your doctor with information about the size and shape of your heart and how well your heart's chambers and valves are working. This procedure takes approximately one hour. There are no restrictions for this procedure.  Follow-Up: At [facility], you and your health needs are our priority.  As part of our continuing mission to provide you with exceptional heart care, we have created designated Provider Care Teams.  These Care Teams include your primary Cardiologist (physician) and Advanced Practice Providers (APPs -  Physician Assistants and Nurse Practitioners) who all work together to provide you with the care you need, when you need it.  Your next appointment:   2-3 month(s) (August/September 2023 after echo)  The format for your next appointment:   In Person  Provider:   Sinclair Grooms, MD {   Important Information About Sugar

## 2022-05-10 NOTE — Pre-Procedure Instructions (Signed)
Surgical Instructions    Your procedure is scheduled on Monday 05/17/22.   Report to [facility] Main Entrance "A" at 07:00 A.M., then check in with the Admitting office.  Call this number if you have problems the morning of surgery:  [phone]   If you have any questions prior to your surgery date call [phone]: Open Monday-Friday 8am-4pm    Remember:  Do not eat after midnight the night before your surgery  You may drink clear liquids until 06:00 A.M. the morning of your surgery.   Clear liquids allowed are: Water, Non-Citrus Juices (without pulp), Carbonated Beverages, Clear Tea, Black Coffee ONLY (NO MILK, CREAM OR POWDERED CREAMER of any kind), and Gatorade  Patient Instructions  The night before surgery:  No food after midnight. ONLY clear liquids after midnight Drink one Pre-Surgery Clear Ensure with dinner the night before surgery and drink one Pre-Surgery Clear Ensure before bed.   The day of surgery (if you do NOT have diabetes):  Drink ONE (1) Pre-Surgery Clear Ensure by 06:00 A.M. the morning of surgery. Drink in one sitting. Do not sip.  This drink was given to you during your hospital  pre-op appointment visit.  Nothing else to drink after completing the  Pre-Surgery Clear Ensure.         If you have questions, please contact your surgeon's office.     Take these medicines the morning of surgery with A SIP OF WATER:   citalopram (CELEXA)   cloNIDine (CATAPRES)  doxazosin (CARDURA)  ezetimibe (ZETIA)  hydrALAZINE (APRESOLINE)  metoprolol succinate (TOPROL-XL)  mirabegron ER (MYRBETRIQ)  pantoprazole (PROTONIX)   potassium chloride (KLOR-CON)   Take these medicines if needed:   amLODipine (NORVASC)  gabapentin (NEURONTIN)  meclizine (ANTIVERT)   methocarbamol (ROBAXIN)   ondansetron (ZOFRAN)  traMADol (ULTRAM)   As of today, STOP taking any Aspirin (unless otherwise instructed by your surgeon) Aleve, Naproxen, Ibuprofen, Motrin, Advil, Goody's,  BC's, all herbal medications, fish oil, and all vitamins.           Do not wear jewelry or makeup Do not wear lotions, powders, perfumes/colognes, or deodorant. Do not shave 48 hours prior to surgery.  Men may shave face and neck. Do not bring valuables to the hospital. Do not wear nail polish, gel polish, artificial nails, or any other type of covering on natural nails (fingers and toes) If you have artificial nails or gel coating that need to be removed by a nail salon, please have this removed prior to surgery. Artificial nails or gel coating may interfere with anesthesia's ability to adequately monitor your vital signs.   is not responsible for any belongings or valuables. .   Do NOT Smoke (Tobacco/Vaping)  24 hours prior to your procedure  If you use a CPAP at night, you may bring your mask for your overnight stay.   Contacts, glasses, hearing aids, dentures or partials may not be worn into surgery, please bring cases for these belongings   For patients admitted to the hospital, discharge time will be determined by your treatment team.   Patients discharged the day of surgery will not be allowed to drive home, and someone needs to stay with them for 24 hours.   SURGICAL WAITING ROOM VISITATION Patients having surgery or a procedure in a hospital may have two support people. Children under the age of 64 must have an adult with them who is not the patient. They may stay in the waiting area during the procedure  and may switch out with other visitors. If the patient needs to stay at the hospital during part of their recovery, the visitor guidelines for inpatient rooms apply.  Please refer to the [facility] website for the visitor guidelines for Inpatients (after your surgery is over and you are in a regular room).       Special instructions:    Oral Hygiene is also important to reduce your risk of infection.  Remember - BRUSH YOUR TEETH THE MORNING OF SURGERY WITH YOUR  REGULAR TOOTHPASTE   [facility]- Preparing For Surgery  Before surgery, you can play an important role. Because skin is not sterile, your skin needs to be as free of germs as possible. You can reduce the number of germs on your skin by washing with CHG (chlorahexidine gluconate) Soap before surgery.  CHG is an antiseptic cleaner which kills germs and bonds with the skin to continue killing germs even after washing.     Please do not use if you have an allergy to CHG or antibacterial soaps. If your skin becomes reddened/irritated stop using the CHG.  Do not shave (including legs and underarms) for at least 48 hours prior to first CHG shower. It is OK to shave your face.  Please follow these instructions carefully.     Shower the NIGHT BEFORE SURGERY and the MORNING OF SURGERY with CHG Soap.   If you chose to wash your hair, wash your hair first as usual with your normal shampoo. After you shampoo, rinse your hair and body thoroughly to remove the shampoo.  Then [employer] and genitals (private parts) with your normal soap and rinse thoroughly to remove soap.  After that Use CHG Soap as you would any other liquid soap. You can apply CHG directly to the skin and wash gently with a scrungie or a clean washcloth.   Apply the CHG Soap to your body ONLY FROM THE NECK DOWN.  Do not use on open wounds or open sores. Avoid contact with your eyes, ears, mouth and genitals (private parts). Wash Face and genitals (private parts)  with your normal soap.   Wash thoroughly, paying special attention to the area where your surgery will be performed.  Thoroughly rinse your body with warm water from the neck down.  DO NOT shower/wash with your normal soap after using and rinsing off the CHG Soap.  Pat yourself dry with a CLEAN TOWEL.  Wear CLEAN PAJAMAS to bed the night before surgery  Place CLEAN SHEETS on your bed the night before your surgery  DO NOT SLEEP WITH PETS.   Day of Surgery:  Take a  shower with CHG soap. Wear Clean/Comfortable clothing the morning of surgery Do not apply any deodorants/lotions.   Remember to brush your teeth WITH YOUR REGULAR TOOTHPASTE.    If you received a COVID test during your pre-op visit, it is requested that you wear a mask when out in public, stay away from anyone that may not be feeling well, and notify your surgeon if you develop symptoms. If you have been in contact with anyone that has tested positive in the last 10 days, please notify your surgeon.    Please read over the following fact sheets that you were given.

## 2022-05-11 ENCOUNTER — Encounter (HOSPITAL_COMMUNITY)
Admission: RE | Admit: 2022-05-11 | Discharge: 2022-05-11 | Disposition: A | Discharge status: Home / Self Care | Payer: Medicare Other | Source: Ambulatory Visit | Attending: General Surgery | Admitting: General Surgery

## 2022-05-11 ENCOUNTER — Other Ambulatory Visit: Payer: Self-pay

## 2022-05-11 ENCOUNTER — Encounter (HOSPITAL_COMMUNITY): Payer: Self-pay

## 2022-05-11 VITALS — BP 171/82 | Temp 98.0°F | Resp 18 | Ht 67.0 in | Wt 169.4 lb

## 2022-05-11 DIAGNOSIS — I480 Paroxysmal atrial fibrillation: Secondary | ICD-10-CM | POA: Diagnosis not present

## 2022-05-11 DIAGNOSIS — I083 Combined rheumatic disorders of mitral, aortic and tricuspid valves: Secondary | ICD-10-CM | POA: Insufficient documentation

## 2022-05-11 DIAGNOSIS — Z933 Colostomy status: Secondary | ICD-10-CM | POA: Insufficient documentation

## 2022-05-11 DIAGNOSIS — Z01818 Encounter for other preprocedural examination: Secondary | ICD-10-CM

## 2022-05-11 DIAGNOSIS — E785 Hyperlipidemia, unspecified: Secondary | ICD-10-CM | POA: Diagnosis not present

## 2022-05-11 DIAGNOSIS — Z01812 Encounter for preprocedural laboratory examination: Secondary | ICD-10-CM | POA: Insufficient documentation

## 2022-05-11 DIAGNOSIS — I10 Essential (primary) hypertension: Secondary | ICD-10-CM | POA: Insufficient documentation

## 2022-05-11 DIAGNOSIS — I69354 Hemiplegia and hemiparesis following cerebral infarction affecting left non-dominant side: Secondary | ICD-10-CM | POA: Diagnosis not present

## 2022-05-11 LAB — CBC
HCT: 43.3 % (ref 36.0–46.0)
Hemoglobin: 13.5 g/dL (ref 12.0–15.0)
MCH: 28.2 pg (ref 26.0–34.0)
MCHC: 31.2 g/dL (ref 30.0–36.0)
MCV: 90.4 fL (ref 80.0–100.0)
Platelets: 271 10*3/uL (ref 150–400)
RBC: 4.79 MIL/uL (ref 3.87–5.11)
RDW: 14.4 % (ref 11.5–15.5)
WBC: 3.4 10*3/uL — ABNORMAL LOW (ref 4.0–10.5)
nRBC: 0 % (ref 0.0–0.2)

## 2022-05-11 LAB — BASIC METABOLIC PANEL
Anion gap: 9 (ref 5–15)
BUN: 22 mg/dL (ref 8–23)
CO2: 30 mmol/L (ref 22–32)
Calcium: 9.3 mg/dL (ref 8.9–10.3)
Chloride: 100 mmol/L (ref 98–111)
Creatinine, Ser: 1.53 mg/dL — ABNORMAL HIGH (ref 0.44–1.00)
GFR, Estimated: 36 mL/min — ABNORMAL LOW (ref >60)
Glucose, Bld: 117 mg/dL — ABNORMAL HIGH (ref 70–99)
Potassium: 3.5 mmol/L (ref 3.5–5.1)
Sodium: 139 mmol/L (ref 135–145)

## 2022-05-11 NOTE — Progress Notes (Signed)
PCP - Raelyn Number, PA Cardiologist - Dr. Daneen Schick  PPM/ICD - n/a Device Orders - n/a Rep Notified - n/a  Chest x-ray - n/a EKG - 04/07/22 Stress Test - 12/30/19 ECHO - 01/06/2022 Cardiac Cath - 08/07/2010  Sleep Study - denies CPAP - n/a  Diabetes- denies  Blood Thinner Instructions: n/a Aspirin Instructions: As of today stop taking Aspirin unless otherwise instructed by your surgeon.   ERAS Protcol - Clear liquids until 06:00 A.M. day of surgery.  PRE-SURGERY Ensure or G2- Ensure x 3 drinks. Two night before surgery and one morning of surgery.   COVID TEST- n/a   Anesthesia review: Yes. Clearance note from Dr. Daneen Schick 05/10/22.  Patient denies shortness of breath, fever, cough and chest pain at PAT appointment   All instructions explained to the patient, with a verbal understanding of the material. Patient agrees to go over the instructions while at home for a better understanding. The opportunity to ask questions was provided.

## 2022-05-12 NOTE — Anesthesia Preprocedure Evaluation (Addendum)
Anesthesia Evaluation  Patient identified by MRN, date of birth, ID band Patient awake    Reviewed: Allergy & Precautions, NPO status , Patient's Chart, lab work & pertinent test results  History of Anesthesia Complications Negative for: history of anesthetic complications  Airway Mallampati: III  TM Distance: >3 FB Neck ROM: Full    Dental  (+) Dental Advisory Given   Pulmonary neg pulmonary ROS, Patient abstained from smoking., former smoker,    Pulmonary exam normal        Cardiovascular hypertension, Normal cardiovascular exam+ dysrhythmias Atrial Fibrillation + Valvular Problems/Murmurs AI and MR    Echo 01/06/22: EF 50%, global hypokinesis, g2dd, normal RVSF, mildly elevated PASP (41.4), mod MR, mod TR, mod to severe AR, no MS/AS, ascending aorta 40 mm   Neuro/Psych CVA (2007), Residual Symptoms    GI/Hepatic Neg liver ROS, hiatal hernia, PUD, GERD  ,Perforated diverticulitis s/p Hartmann procedure   Endo/Other  negative endocrine ROS  Renal/GU 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)  negative genitourinary   Musculoskeletal negative musculoskeletal ROS (+)   Abdominal   Peds  Hematology negative hematology ROS (+)   Anesthesia Other Findings   Reproductive/Obstetrics                            Anesthesia Physical Anesthesia Plan  ASA: 3  Anesthesia Plan: General   Post-op Pain Management: Tylenol PO (pre-op)*   Induction: Intravenous  PONV Risk Score and Plan: 4 or greater and Ondansetron, Dexamethasone, Treatment may vary due to age or medical condition and Midazolam  Airway Management Planned: Oral ETT  Additional Equipment: None  Intra-op Plan:   Post-operative Plan: Extubation in OR  Informed Consent: I have reviewed the patients History and Physical, chart, labs and discussed the procedure including the risks, benefits and alternatives for the proposed anesthesia with the patient  or authorized representative who has indicated his/her understanding and acceptance.     Dental advisory given  Plan Discussed with:   Anesthesia Plan Comments: (PAT note by Karoline Caldwell, PA-C: Recent history of perforated diverticulitis s/p [facility] procedure December 2022. Post op course was notable for ileus, uncontrolled BP, paroxysmal atrial fibrillation with RVR, AKI.   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 Last seen by Dr. Tamala Julian 05/10/22 for preop eval. Per note, "No clinical signs or features of volume overload/CHF.  She is cleared for upcoming takedown of colostomy.  Her risk will be low.  She could get into trouble if excessive IV fluid is administered.  She has valvular heart disease with significant aortic regurgitation and low normal LV function."  Preop labs reviewed, creatinine mildly elevated at 1.53 (consistent with history of CKD), otherwise unremarkable.  EKG 04/07/2022: NSR.  Rate 61.  Possible LAE.  LAD.  LVH.  Nonspecific T wave abnormality.  TTE 01/06/2022: 1. Left ventricular ejection fraction, by estimation, is 50%. The left  ventricle has mildly decreased function. The left ventricle demonstrates  global hypokinesis. Left ventricular diastolic parameters are consistent  with Grade II diastolic dysfunction  (pseudonormalization).  2. Right ventricular systolic function is normal. The right ventricular  size is normal. There is mildly elevated pulmonary artery systolic  pressure. The estimated right ventricular systolic pressure is 88.9 mmHg.  3. Left atrial size was moderately dilated.  4. The mitral valve is normal in structure. Moderate mitral  valve  regurgitation. No evidence of mitral stenosis.  5. The tricuspid valve is abnormal. Tricuspid valve regurgitation is  moderate.   6. The aortic valve is tricuspid. Aortic valve regurgitation is moderate  to severe. Not severe by pressure halftime, but vena contracta width is  0.55-0.6 cm. No holodiastolic flow reversal in the descending thoracic  aorta. No aortic stenosis is present.  7. Aortic dilatation noted. There is mild dilatation of the visualized  ascending aorta, measuring 40 mm (the ascending aorta was incompletely  visualized, suggest CTA or MRA to assess the full thoracic aorta).  8. The inferior vena cava is normal in size with greater than 50%  respiratory variability, suggesting right atrial pressure of 3 mmHg.  )       Anesthesia Quick Evaluation

## 2022-05-12 NOTE — Progress Notes (Signed)
Anesthesia Chart Review:  Recent history of perforated diverticulitis s/p Hartmann procedure December 2022. Post op course was notable for ileus, uncontrolled BP, paroxysmal atrial fibrillation with RVR, AKI.    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 Last seen by Dr. Tamala Julian 05/10/22 for preop eval. Per note, "No clinical signs or features of volume overload/CHF.  She is cleared for upcoming takedown of colostomy.  Her risk will be low.  She could get into trouble if excessive IV fluid is administered.  She has valvular heart disease with significant aortic regurgitation and low normal LV function."  Preop labs reviewed, creatinine mildly elevated at 1.53 (consistent with history of CKD), otherwise unremarkable.  EKG 04/07/2022: NSR.  Rate 61.  Possible LAE.  LAD.  LVH.  Nonspecific T wave abnormality.  TTE 01/06/2022:  1. Left ventricular ejection fraction, by estimation, is 50%. The left  ventricle has mildly decreased function. The left ventricle demonstrates  global hypokinesis. Left ventricular diastolic parameters are consistent  with Grade II diastolic dysfunction  (pseudonormalization).   2. Right ventricular systolic function is normal. The right ventricular  size is normal. There is mildly elevated pulmonary artery systolic  pressure. The estimated right ventricular systolic pressure is 25.9 mmHg.   3. Left atrial size was moderately dilated.   4. The mitral valve is normal in structure. Moderate mitral valve  regurgitation. No evidence of mitral stenosis.   5. The tricuspid valve is abnormal. Tricuspid valve regurgitation is  moderate.   6. The aortic valve is tricuspid. Aortic valve regurgitation is moderate  to severe. Not severe by pressure halftime, but vena contracta width is  0.55-0.6 cm. No  holodiastolic flow reversal in the descending thoracic  aorta. No aortic stenosis is present.   7. Aortic dilatation noted. There is mild dilatation of the visualized  ascending aorta, measuring 40 mm (the ascending aorta was incompletely  visualized, suggest CTA or MRA to assess the full thoracic aorta).   8. The inferior vena cava is normal in size with greater than 50%  respiratory variability, suggesting right atrial pressure of 3 mmHg.    Wynonia Musty [facility] Short Stay Center/Anesthesiology Phone [phone] 05/12/2022 1:58 PM

## 2022-05-17 ENCOUNTER — Encounter (HOSPITAL_COMMUNITY): Payer: Self-pay | Admitting: General Surgery

## 2022-05-17 ENCOUNTER — Encounter (HOSPITAL_COMMUNITY)
Admission: RE | Disposition: A | Discharge status: Home Health | Payer: Self-pay | Source: Home / Self Care | Attending: General Surgery

## 2022-05-17 ENCOUNTER — Other Ambulatory Visit: Payer: Self-pay

## 2022-05-17 ENCOUNTER — Inpatient Hospital Stay (HOSPITAL_COMMUNITY): Payer: Medicare Other | Admitting: Physician Assistant

## 2022-05-17 ENCOUNTER — Inpatient Hospital Stay (HOSPITAL_COMMUNITY)
Admission: RE | Admit: 2022-05-17 | Discharge: 2022-05-21 | DRG: 330 | Disposition: A | Discharge status: Home Health | Payer: Medicare Other | Attending: General Surgery | Admitting: General Surgery

## 2022-05-17 ENCOUNTER — Inpatient Hospital Stay (HOSPITAL_COMMUNITY): Payer: Medicare Other | Admitting: Anesthesiology

## 2022-05-17 DIAGNOSIS — Z433 Encounter for attention to colostomy: Secondary | ICD-10-CM

## 2022-05-17 DIAGNOSIS — Z813 Family history of other psychoactive substance abuse and dependence: Secondary | ICD-10-CM

## 2022-05-17 DIAGNOSIS — N3281 Overactive bladder: Secondary | ICD-10-CM | POA: Diagnosis not present

## 2022-05-17 DIAGNOSIS — I129 Hypertensive chronic kidney disease with stage 1 through stage 4 chronic kidney disease, or unspecified chronic kidney disease: Secondary | ICD-10-CM

## 2022-05-17 DIAGNOSIS — K567 Ileus, unspecified: Secondary | ICD-10-CM | POA: Diagnosis not present

## 2022-05-17 DIAGNOSIS — E785 Hyperlipidemia, unspecified: Secondary | ICD-10-CM | POA: Diagnosis not present

## 2022-05-17 DIAGNOSIS — I69354 Hemiplegia and hemiparesis following cerebral infarction affecting left non-dominant side: Secondary | ICD-10-CM | POA: Diagnosis not present

## 2022-05-17 DIAGNOSIS — Z9889 Other specified postprocedural states: Principal | ICD-10-CM

## 2022-05-17 DIAGNOSIS — N189 Chronic kidney disease, unspecified: Secondary | ICD-10-CM | POA: Diagnosis not present

## 2022-05-17 DIAGNOSIS — Z8249 Family history of ischemic heart disease and other diseases of the circulatory system: Secondary | ICD-10-CM | POA: Diagnosis not present

## 2022-05-17 DIAGNOSIS — N182 Chronic kidney disease, stage 2 (mild): Secondary | ICD-10-CM | POA: Diagnosis present

## 2022-05-17 DIAGNOSIS — Z7982 Long term (current) use of aspirin: Secondary | ICD-10-CM

## 2022-05-17 DIAGNOSIS — I4891 Unspecified atrial fibrillation: Secondary | ICD-10-CM

## 2022-05-17 DIAGNOSIS — Z809 Family history of malignant neoplasm, unspecified: Secondary | ICD-10-CM | POA: Diagnosis not present

## 2022-05-17 DIAGNOSIS — Z90710 Acquired absence of both cervix and uterus: Secondary | ICD-10-CM | POA: Diagnosis not present

## 2022-05-17 DIAGNOSIS — Z87891 Personal history of nicotine dependence: Secondary | ICD-10-CM | POA: Diagnosis not present

## 2022-05-17 DIAGNOSIS — Z79899 Other long term (current) drug therapy: Secondary | ICD-10-CM | POA: Diagnosis not present

## 2022-05-17 DIAGNOSIS — K219 Gastro-esophageal reflux disease without esophagitis: Secondary | ICD-10-CM | POA: Diagnosis present

## 2022-05-17 DIAGNOSIS — Z933 Colostomy status: Secondary | ICD-10-CM | POA: Diagnosis not present

## 2022-05-17 HISTORY — PX: COLOSTOMY REVERSAL: SHX5782

## 2022-05-17 SURGERY — COLOSTOMY REVERSAL
Anesthesia: General | Site: Abdomen

## 2022-05-17 SURGERY — Surgical Case
Anesthesia: *Unknown

## 2022-05-17 MED ORDER — MIDAZOLAM HCL 2 MG/2ML IJ SOLN
INTRAMUSCULAR | Status: AC
  Filled 2022-05-17: qty 2

## 2022-05-17 MED ORDER — CHLORHEXIDINE GLUCONATE CLOTH 2 % EX PADS: 6.0000 | MEDICATED_PAD | Freq: Once | CUTANEOUS | Status: DC

## 2022-05-17 MED ORDER — DOXAZOSIN MESYLATE 8 MG PO TABS
8.0000 mg | ORAL_TABLET | Freq: Every day | ORAL | Status: DC
  Administered 2022-05-18 – 2022-05-20 (×3): 8 mg via ORAL
  Filled 2022-05-17 (×4): qty 1

## 2022-05-17 MED ORDER — FENTANYL CITRATE (PF) 100 MCG/2ML IJ SOLN
INTRAMUSCULAR | Status: AC
  Filled 2022-05-17: qty 2

## 2022-05-17 MED ORDER — LIDOCAINE 2% (20 MG/ML) 5 ML SYRINGE
INTRAMUSCULAR | Status: DC | PRN
  Administered 2022-05-17: 100 mg via INTRAVENOUS

## 2022-05-17 MED ORDER — DEXAMETHASONE SODIUM PHOSPHATE 10 MG/ML IJ SOLN
INTRAMUSCULAR | Status: AC
  Filled 2022-05-17: qty 1

## 2022-05-17 MED ORDER — FENTANYL CITRATE (PF) 250 MCG/5ML IJ SOLN
INTRAMUSCULAR | Status: AC
  Filled 2022-05-17: qty 5

## 2022-05-17 MED ORDER — MIRABEGRON ER 25 MG PO TB24
25.0000 mg | ORAL_TABLET | Freq: Every morning | ORAL | Status: DC
  Administered 2022-05-18 – 2022-05-21 (×4): 25 mg via ORAL
  Filled 2022-05-17 (×4): qty 1

## 2022-05-17 MED ORDER — SUGAMMADEX SODIUM 200 MG/2ML IV SOLN
INTRAVENOUS | Status: DC | PRN
  Administered 2022-05-17: 153.4 mg via INTRAVENOUS

## 2022-05-17 MED ORDER — ENSURE PRE-SURGERY PO LIQD: 296.0000 mL | Freq: Once | ORAL | Status: DC

## 2022-05-17 MED ORDER — LIDOCAINE 2% (20 MG/ML) 5 ML SYRINGE
INTRAMUSCULAR | Status: AC
  Filled 2022-05-17: qty 5

## 2022-05-17 MED ORDER — HYDRALAZINE HCL 25 MG PO TABS
25.0000 mg | ORAL_TABLET | Freq: Three times a day (TID) | ORAL | Status: DC
  Administered 2022-05-17 – 2022-05-20 (×11): 25 mg via ORAL
  Filled 2022-05-17 (×11): qty 1

## 2022-05-17 MED ORDER — TRAMADOL HCL 50 MG PO TABS
50.0000 mg | ORAL_TABLET | Freq: Four times a day (QID) | ORAL | Status: DC | PRN
  Administered 2022-05-17 – 2022-05-19 (×4): 50 mg via ORAL
  Filled 2022-05-17 (×4): qty 1

## 2022-05-17 MED ORDER — SPIRONOLACTONE 25 MG PO TABS
25.0000 mg | ORAL_TABLET | Freq: Every day | ORAL | Status: DC
  Administered 2022-05-18 – 2022-05-20 (×3): 25 mg via ORAL
  Filled 2022-05-17 (×3): qty 1

## 2022-05-17 MED ORDER — METOPROLOL SUCCINATE ER 25 MG PO TB24
50.0000 mg | ORAL_TABLET | Freq: Every morning | ORAL | Status: DC
  Administered 2022-05-18 – 2022-05-21 (×4): 50 mg via ORAL
  Filled 2022-05-17 (×4): qty 2

## 2022-05-17 MED ORDER — ENSURE PRE-SURGERY PO LIQD: 592.0000 mL | Freq: Once | ORAL | Status: DC

## 2022-05-17 MED ORDER — 0.9 % SODIUM CHLORIDE (POUR BTL) OPTIME
TOPICAL | Status: DC | PRN
  Administered 2022-05-17 (×3): 1000 mL

## 2022-05-17 MED ORDER — ONDANSETRON HCL 4 MG/2ML IJ SOLN
INTRAMUSCULAR | Status: AC
  Filled 2022-05-17: qty 2

## 2022-05-17 MED ORDER — HYDROMORPHONE HCL 1 MG/ML IJ SOLN
0.5000 mg | INTRAMUSCULAR | Status: DC | PRN
  Administered 2022-05-17: 1 mg via INTRAVENOUS
  Administered 2022-05-17: 0.5 mg via INTRAVENOUS
  Administered 2022-05-18 (×2): 1 mg via INTRAVENOUS
  Filled 2022-05-17 (×3): qty 1

## 2022-05-17 MED ORDER — ROCURONIUM BROMIDE 10 MG/ML (PF) SYRINGE
PREFILLED_SYRINGE | INTRAVENOUS | Status: DC | PRN
  Administered 2022-05-17: 20 mg via INTRAVENOUS
  Administered 2022-05-17: 80 mg via INTRAVENOUS

## 2022-05-17 MED ORDER — ROCURONIUM BROMIDE 10 MG/ML (PF) SYRINGE
PREFILLED_SYRINGE | INTRAVENOUS | Status: AC
  Filled 2022-05-17: qty 10

## 2022-05-17 MED ORDER — AMISULPRIDE (ANTIEMETIC) 5 MG/2ML IV SOLN
10.0000 mg | Freq: Once | INTRAVENOUS | Status: DC | PRN
Start: 2022-05-17 — End: 2022-05-17

## 2022-05-17 MED ORDER — FENTANYL CITRATE (PF) 250 MCG/5ML IJ SOLN
INTRAMUSCULAR | Status: DC | PRN
  Administered 2022-05-17: 25 ug via INTRAVENOUS
  Administered 2022-05-17: 100 ug via INTRAVENOUS
  Administered 2022-05-17 (×2): 25 ug via INTRAVENOUS

## 2022-05-17 MED ORDER — EPHEDRINE SULFATE-NACL 50-0.9 MG/10ML-% IV SOSY
PREFILLED_SYRINGE | INTRAVENOUS | Status: DC | PRN
  Administered 2022-05-17: 2.5 mg via INTRAVENOUS
  Administered 2022-05-17: 5 mg via INTRAVENOUS
  Administered 2022-05-17: 2.5 mg via INTRAVENOUS
  Administered 2022-05-17 (×2): 5 mg via INTRAVENOUS

## 2022-05-17 MED ORDER — ACETAMINOPHEN 500 MG PO TABS
1000.0000 mg | ORAL_TABLET | Freq: Once | ORAL | Status: AC
  Administered 2022-05-17: 1000 mg via ORAL
  Filled 2022-05-17: qty 2

## 2022-05-17 MED ORDER — ORAL CARE MOUTH RINSE: 15.0000 mL | Freq: Once | OROMUCOSAL | Status: AC

## 2022-05-17 MED ORDER — NICOTINE 21 MG/24HR TD PT24: 21.0000 mg | MEDICATED_PATCH | Freq: Every day | TRANSDERMAL | Status: DC | PRN

## 2022-05-17 MED ORDER — ENOXAPARIN SODIUM 40 MG/0.4ML IJ SOSY
40.0000 mg | PREFILLED_SYRINGE | Freq: Every day | INTRAMUSCULAR | Status: DC
  Administered 2022-05-18 – 2022-05-20 (×3): 40 mg via SUBCUTANEOUS
  Filled 2022-05-17 (×3): qty 0.4

## 2022-05-17 MED ORDER — LACTATED RINGERS IV SOLN: INTRAVENOUS | Status: DC

## 2022-05-17 MED ORDER — ONDANSETRON HCL 4 MG/2ML IJ SOLN: 4.0000 mg | Freq: Once | INTRAMUSCULAR | Status: DC | PRN

## 2022-05-17 MED ORDER — PANTOPRAZOLE SODIUM 40 MG PO TBEC
40.0000 mg | DELAYED_RELEASE_TABLET | Freq: Every day | ORAL | Status: DC
  Administered 2022-05-18 – 2022-05-20 (×3): 40 mg via ORAL
  Filled 2022-05-17 (×3): qty 1

## 2022-05-17 MED ORDER — DEXAMETHASONE SODIUM PHOSPHATE 10 MG/ML IJ SOLN
INTRAMUSCULAR | Status: DC | PRN
  Administered 2022-05-17: 10 mg via INTRAVENOUS

## 2022-05-17 MED ORDER — OXYCODONE HCL 5 MG PO TABS: 5.0000 mg | ORAL_TABLET | Freq: Once | ORAL | Status: DC | PRN

## 2022-05-17 MED ORDER — AMLODIPINE BESYLATE 10 MG PO TABS: 10.0000 mg | ORAL_TABLET | Freq: Every day | ORAL | Status: DC | PRN

## 2022-05-17 MED ORDER — ONDANSETRON HCL 4 MG PO TABS
8.0000 mg | ORAL_TABLET | Freq: Three times a day (TID) | ORAL | Status: DC | PRN
Start: 2022-05-17 — End: 2022-05-21
  Administered 2022-05-19: 8 mg via ORAL
  Filled 2022-05-17 (×2): qty 2

## 2022-05-17 MED ORDER — METHOCARBAMOL 750 MG PO TABS
750.0000 mg | ORAL_TABLET | Freq: Three times a day (TID) | ORAL | Status: DC | PRN
Start: 2022-05-17 — End: 2022-05-21

## 2022-05-17 MED ORDER — OXYCODONE HCL 5 MG/5ML PO SOLN: 5.0000 mg | Freq: Once | ORAL | Status: DC | PRN

## 2022-05-17 MED ORDER — MECLIZINE HCL 25 MG PO TABS: 25.0000 mg | ORAL_TABLET | Freq: Three times a day (TID) | ORAL | Status: DC | PRN

## 2022-05-17 MED ORDER — SODIUM CHLORIDE 0.9 % IV SOLN
2.0000 g | INTRAVENOUS | Status: AC
  Administered 2022-05-17: 2 g via INTRAVENOUS
  Filled 2022-05-17: qty 2

## 2022-05-17 MED ORDER — GABAPENTIN 800 MG PO TABS: 400.0000 mg | ORAL_TABLET | Freq: Two times a day (BID) | ORAL | Status: DC | PRN

## 2022-05-17 MED ORDER — HYDROMORPHONE HCL 1 MG/ML IJ SOLN
INTRAMUSCULAR | Status: AC
  Filled 2022-05-17: qty 1

## 2022-05-17 MED ORDER — CHLORHEXIDINE GLUCONATE 0.12 % MT SOLN
15.0000 mL | Freq: Once | OROMUCOSAL | Status: AC
  Administered 2022-05-17: 15 mL via OROMUCOSAL
  Filled 2022-05-17: qty 15

## 2022-05-17 MED ORDER — ONDANSETRON HCL 4 MG/2ML IJ SOLN
INTRAMUSCULAR | Status: DC | PRN
  Administered 2022-05-17: 4 mg via INTRAVENOUS

## 2022-05-17 MED ORDER — PROPOFOL 10 MG/ML IV BOLUS
INTRAVENOUS | Status: AC
  Filled 2022-05-17: qty 20

## 2022-05-17 MED ORDER — POTASSIUM CHLORIDE CRYS ER 10 MEQ PO TBCR
10.0000 meq | EXTENDED_RELEASE_TABLET | Freq: Every day | ORAL | Status: DC
  Administered 2022-05-18 – 2022-05-20 (×3): 10 meq via ORAL
  Filled 2022-05-17 (×5): qty 1

## 2022-05-17 MED ORDER — EPHEDRINE 5 MG/ML INJ
INTRAVENOUS | Status: AC
  Filled 2022-05-17: qty 5

## 2022-05-17 MED ORDER — FENTANYL CITRATE (PF) 100 MCG/2ML IJ SOLN
25.0000 ug | INTRAMUSCULAR | Status: DC | PRN
  Administered 2022-05-17 (×4): 25 ug via INTRAVENOUS

## 2022-05-17 MED ORDER — PROPOFOL 10 MG/ML IV BOLUS
INTRAVENOUS | Status: DC | PRN
  Administered 2022-05-17: 120 mg via INTRAVENOUS

## 2022-05-17 MED ORDER — CLONIDINE HCL 0.2 MG PO TABS
0.3000 mg | ORAL_TABLET | Freq: Two times a day (BID) | ORAL | Status: DC
  Administered 2022-05-17 – 2022-05-20 (×7): 0.3 mg via ORAL
  Filled 2022-05-17 (×7): qty 1

## 2022-05-17 MED ORDER — ACETAMINOPHEN 500 MG PO TABS: 1000.0000 mg | ORAL_TABLET | ORAL | Status: AC

## 2022-05-17 MED ORDER — METOPROLOL SUCCINATE ER 25 MG PO TB24: 150.0000 mg | ORAL_TABLET | Freq: Every day | ORAL | Status: DC

## 2022-05-17 MED ORDER — INDAPAMIDE 2.5 MG PO TABS
2.5000 mg | ORAL_TABLET | Freq: Every day | ORAL | Status: DC
  Administered 2022-05-18 – 2022-05-20 (×3): 2.5 mg via ORAL
  Filled 2022-05-17 (×4): qty 1

## 2022-05-17 SURGICAL SUPPLY — 47 items
BAG COUNTER SPONGE SURGICOUNT (BAG) ×2 IMPLANT
BAG SPNG CNTER NS LX DISP (BAG) ×1
CANISTER SUCT 3000ML PPV (MISCELLANEOUS) ×2 IMPLANT
COVER SURGICAL LIGHT HANDLE (MISCELLANEOUS) ×4 IMPLANT
DRSG OPSITE POSTOP 3X4 (GAUZE/BANDAGES/DRESSINGS) ×1 IMPLANT
DRSG OPSITE POSTOP 4X10 (GAUZE/BANDAGES/DRESSINGS) IMPLANT
DRSG OPSITE POSTOP 4X8 (GAUZE/BANDAGES/DRESSINGS) ×1 IMPLANT
ELECT CAUTERY BLADE 6.4 (BLADE) ×2 IMPLANT
ELECT REM PT RETURN 9FT ADLT (ELECTROSURGICAL) ×2
ELECTRODE REM PT RTRN 9FT ADLT (ELECTROSURGICAL) ×1 IMPLANT
GLOVE BIO SURGEON STRL SZ8 (GLOVE) ×4 IMPLANT
GLOVE BIOGEL PI IND STRL 8 (GLOVE) ×2 IMPLANT
GLOVE BIOGEL PI INDICATOR 8 (GLOVE) ×2
GOWN STRL REUS W/ TWL LRG LVL3 (GOWN DISPOSABLE) ×4 IMPLANT
GOWN STRL REUS W/ TWL XL LVL3 (GOWN DISPOSABLE) ×2 IMPLANT
GOWN STRL REUS W/TWL LRG LVL3 (GOWN DISPOSABLE) ×8
GOWN STRL REUS W/TWL XL LVL3 (GOWN DISPOSABLE) ×4
KIT SIGMOIDOSCOPE (SET/KITS/TRAYS/PACK) ×1 IMPLANT
KIT TURNOVER KIT B (KITS) ×2 IMPLANT
LIGASURE IMPACT 36 18CM CVD LR (INSTRUMENTS) ×1 IMPLANT
NS IRRIG 1000ML POUR BTL (IV SOLUTION) ×5 IMPLANT
PACK COLON (CUSTOM PROCEDURE TRAY) ×2 IMPLANT
PAD ARMBOARD 7.5X6 YLW CONV (MISCELLANEOUS) ×2 IMPLANT
PENCIL SMOKE EVACUATOR (MISCELLANEOUS) ×2 IMPLANT
RELOAD GRN CONTOUR (ENDOMECHANICALS) ×2 IMPLANT
RELOAD STAPLE 40 GRN THCK (ENDOMECHANICALS) IMPLANT
RETRACTOR WND ALEXIS 25 LRG (MISCELLANEOUS) IMPLANT
RTRCTR WOUND ALEXIS 25CM LRG (MISCELLANEOUS) ×2
SPECIMEN JAR MEDIUM (MISCELLANEOUS) IMPLANT
SPONGE T-LAP 18X18 ~~LOC~~+RFID (SPONGE) ×1 IMPLANT
STAPLER CIRCULAR MANUAL XL 25 (STAPLE) ×1 IMPLANT
STAPLER CVD CUT BL 40 RELOAD (ENDOMECHANICALS) ×2 IMPLANT
STAPLER CVD CUT BLU 40 RELOAD (ENDOMECHANICALS) IMPLANT
STAPLER VISISTAT 35W (STAPLE) ×2 IMPLANT
SURGILUBE 2OZ TUBE FLIPTOP (MISCELLANEOUS) ×1 IMPLANT
SUT PDS AB 1 CTX 36 (SUTURE) ×2 IMPLANT
SUT PDS AB 1 TP1 54 (SUTURE) IMPLANT
SUT PDS AB 1 TP1 96 (SUTURE) ×2 IMPLANT
SUT PROLENE 2 0 CT2 30 (SUTURE) IMPLANT
SUT PROLENE 2 0 KS (SUTURE) ×1 IMPLANT
SUT SILK 2 0 SH CR/8 (SUTURE) ×2 IMPLANT
SUT SILK 2 0 TIES 10X30 (SUTURE) ×2 IMPLANT
SUT SILK 3 0 SH CR/8 (SUTURE) ×2 IMPLANT
SUT SILK 3 0 TIES 10X30 (SUTURE) ×2 IMPLANT
TRAY FOLEY MTR SLVR 14FR STAT (SET/KITS/TRAYS/PACK) ×2 IMPLANT
TUBE CONNECTING 12X1/4 (SUCTIONS) ×4 IMPLANT
UNDERPAD 30X36 HEAVY ABSORB (UNDERPADS AND DIAPERS) ×1 IMPLANT

## 2022-05-17 NOTE — Anesthesia Procedure Notes (Signed)
Procedure Name: Intubation Date/Time: 05/17/2022 7:51 AM  Performed by: Maude Leriche, CRNAPre-anesthesia Checklist: Patient identified, Emergency Drugs available, Suction available and Patient being monitored Patient Re-evaluated:Patient Re-evaluated prior to induction Oxygen Delivery Method: Circle system utilized Preoxygenation: Pre-oxygenation with 100% oxygen Induction Type: IV induction Ventilation: Mask ventilation without difficulty Laryngoscope Size: Miller and 2 Grade View: Grade I Tube type: Oral Tube size: 7.0 mm Number of attempts: 1 Airway Equipment and Method: Stylet and Bite block Placement Confirmation: ETT inserted through vocal cords under direct vision, positive ETCO2 and breath sounds checked- equal and bilateral Secured at: 21 cm Tube secured with: Tape Dental Injury: Teeth and Oropharynx as per pre-operative assessment

## 2022-05-17 NOTE — OR Nursing (Signed)
Colostomy takedown. Colostomy was suture at 7:53a for take down.

## 2022-05-17 NOTE — Anesthesia Postprocedure Evaluation (Signed)
Anesthesia Post Note  Patient: Chelsea [name]  Procedure(s) Performed: COLOSTOMY REVERSAL [facility]) (Abdomen)     Patient location during evaluation: PACU Anesthesia Type: General Level of consciousness: awake and alert Pain management: pain level controlled Vital Signs Assessment: post-procedure vital signs reviewed and stable Respiratory status: spontaneous breathing, nonlabored ventilation and respiratory function stable Cardiovascular status: blood pressure returned to baseline and stable Postop Assessment: no apparent nausea or vomiting Anesthetic complications: no   No notable events documented.  Last Vitals:  Vitals:   05/17/22 1325 05/17/22 1422  BP: 108/69 116/73  Pulse: 79 92  Resp: 17 16  Temp:  37 C  SpO2: 93% 97%    Last Pain:  Vitals:   05/17/22 1422  TempSrc: Oral  PainSc:                  Lidia Collum

## 2022-05-17 NOTE — Op Note (Addendum)
05/17/2022  10:04 AM  PATIENT:  Chelsea [name]  71 y.o. female  PRE-OPERATIVE DIAGNOSIS:  COLOSTOMY IN PLACE  POST-OPERATIVE DIAGNOSIS:  COLOSTOMY IN PLACE  PROCEDURE:  Procedure(s): COLOSTOMY REVERSAL (HARTMAN) PARTIAL COLECTOMY  SURGEON:  Georganna Skeans, MD  ASSISTANTS: Annye English, MD  ANESTHESIA:   general  EBL:  Total I/O In: 800 [I.V.:700; IV Piggyback:100] Out: 130 [Urine:100; Blood:30]  BLOOD ADMINISTERED:none  DRAINS: none   SPECIMEN:  Excision  DISPOSITION OF SPECIMEN:  PATHOLOGY  COUNTS:  YES  DICTATION: .Dragon Dictation Procedure in detail: Informed consent was obtained.  She received intravenous antibiotics.  She was brought to the operating room and general endotracheal anesthesia was administered by the anesthesia staff.  We did a timeout procedure.  I closed her colostomy with running 2-0 silk suture.  Foley catheter was placed by nursing.  She was placed in lithotomy position with appropriate padding.  Her abdomen and perianal region were all prepped and draped in a sterile fashion.  We did another timeout procedure.  I excised her midline scar sharply.  Subcutaneous tissues were dissected down revealing the anterior fascia.  This was divided along the midline and the peritoneal cavity was entered under direct vision carefully.  The fascia was then gradually opened to the length of the incision.  A wound protector was used.There were some omental adhesions inside which were taken down mostly from the pelvis.  We freed up the adhesions around the colostomy on the inside.  We identified the distal sigmoid colon as well.  Decision was made to resect some of the distal sigmoid colon to prevent any recurrence of diverticulitis.  The LigaSure was used to take down the mesentery and a contour stapler was fired across the rectosigmoid.  The segment was sent to pathology.  The colostomy was freed up from the skin and surrounding tissues and brought back into the  abdomen.  A pursestring device was placed across it and a Prolene suture was passed through in standard fashion.  The colon was divided.  We used a sizer and the colon was quite small.  25 mm sizer was appropriate.  We placed multiple 2 oh silks around the edge of the colon here to reinforce and optimize our staple line placement.  The anvil was inserted and the pursestring was tied securely.  I went down below and passed the sizer and the colon took a very sharp angle.  Decision was made to resect some more of the rectosigmoid so we could do the anastomosis.  This was performed and we used a contour stapler again and this additional section of rectosigmoid was also sent to pathology.  We then performed an EEA anastomosis.  The stapler was placed from below and confirmed in the rectum.  The anvil was applied after extending the spoke and the stapler was fired in standard fashion.  2 donuts were complete. Rigid sigmoidoscopy was then performed identifying the anastomosis.  There was no significant bleeding.  We filled out the rectum and distal sigmoid with air and there was no bubbles on the leak test.  The abdomen was copiously irrigated.  We then changed all of the drapes and instruments as well as our gowns and gloves.  The fascia from the colostomy site was closed on the inside with a running #1 PDS and from the outside with running #1 PDS the midline was closed with running #1 looped PDS the skin of both of these wounds was irrigated and the skin of  each was closed with staples.  All counts were correct.  She tolerated the procedure well without apparent complication and was taken recovery in stable condition. PATIENT DISPOSITION:  PACU - hemodynamically stable.   Delay start of Pharmacological VTE agent (>24hrs) due to surgical blood loss or risk of bleeding:  no  Georganna Skeans, MD, MPH, FACS Pager: [phone]  6/19/202310:04 AM

## 2022-05-17 NOTE — Transfer of Care (Signed)
Immediate Anesthesia Transfer of Care Note  Patient: Chelsea [name]  Procedure(s) Performed: COLOSTOMY REVERSAL [facility]) (Abdomen)  Patient Location: PACU  Anesthesia Type:General  Level of Consciousness: awake, alert  and oriented  Airway & Oxygen Therapy: Patient Spontanous Breathing and Patient connected to face mask oxygen  Post-op Assessment: Report given to RN, Post -op Vital signs reviewed and stable, Patient moving all extremities X 4 and Patient able to stick tongue midline  Post vital signs: Reviewed  Last Vitals:  Vitals Value Taken Time  BP 131/75 05/17/22 1009  Temp 98.4   Pulse 69 05/17/22 1010  Resp 11 05/17/22 1010  SpO2 100 % 05/17/22 1010  Vitals shown include unvalidated device data.  Last Pain:  Vitals:   05/17/22 0704  TempSrc:   PainSc: 0-No pain         Complications: No notable events documented.

## 2022-05-17 NOTE — H&P (Signed)
Chelsea [name] is an 71 y.o. female.   Chief Complaint: colostomy in place HPI: Presents for colostomy takedown. She tolerated her prep.  Past Medical History:  Diagnosis Date   Anxiety    Aortic aneurysm without rupture [facility])    followed by dr dr h. 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 [facility]   (nuclear stress test 01-31-20121 normal w/ ef 54%; cardaic cath 08-07-2010 no sig coronary obstruction, preserved LVF, mild AR   Multinodular goiter    followed by pcp,  last ultrasound in epic 03-20-2020 , never met critiria for bx   Nonrheumatic aortic (valve) insufficiency    followed by cardiologist--- dr h. 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,  followed by dr atRexene Alberts Urge incontinence    Wears glasses    Wears hearing aid in both ears    Wears partial dentures    upper and lower    Past Surgical History:  Procedure  Laterality Date   ABDOMINAL HYSTERECTOMY     CARDIAC CATHETERIZATION  08/07/2010   [facility]  no sig coronary obstruction, preserved LVF, mild AR   COLON RESECTION SIGMOID N/A 11/08/2021   Procedure: COLON RESECTION SIGMOID; DRAINAGE OF INTRAABDOMINAL ABSCESS;  Surgeon: ThGeorganna SkeansMD;  [place] [facility] [place];  Laterality: N/A;   COLOSTOMY N/A 11/08/2021   Procedure: COLOSTOMY;  Surgeon: ThGeorganna SkeansMD;  [place] [facility] [facility];  Laterality: N/A;   MASS EXCISION N/A 06/04/2021   Procedure: EXCISION SUCUTANEOUS MASS X2, UPPER BACK;  Surgeon: CoClovis RileyMD;  [place]: General;  Laterality: N/A;   TOTAL KNEE ARTHROPLASTY Left 01/05/2016   Procedure: TOTAL KNEE ARTHROPLASTY;  Surgeon: FrFrederik PearMD;  [place] [facility]opedics;  Laterality: Left;   TUBAL LIGATION Bilateral 2000    Family History  Problem Relation Age of Onset   Hypertension Mother        deceased   Peripheral vascular disease Mother    Cirrhosis Father        DeCEASED   Heart attack Other        grandmother deceased   Cancer Brother    Drug  abuse Brother        overdose, in 1 brother   Breast cancer Neg Hx    Social History:  reports that she quit smoking about 6 months ago. Her smoking use included cigarettes. She has a 50.00 pack-year smoking history. She has never used smokeless tobacco. She reports that she does not drink alcohol and does not use drugs.  Allergies:  Allergies  Allergen Reactions   Other Swelling    Hair dye, caused eye swelling, multiple times experienced this reaction   Hydrocodone Nausea And Vomiting   Oxycodone Nausea And Vomiting    Other reaction(s): Unknown    Medications Prior to Admission  Medication Sig Dispense Refill   amLODipine (NORVASC) 10 MG tablet TAKE 1 TABLET (10 MG TOTAL) BY MOUTH DAILY FOR BLOOD PRESSURE (Patient taking differently: Take 10 mg by mouth daily as needed (elevated bp 180/110).)  90 tablet 3   Ascorbic Acid (VITAMIN C) 1000 MG tablet Take 1,000 mg by mouth in the morning.     aspirin EC 81 MG tablet Take 1 tablet (81 mg total) by mouth daily.     Aspirin-Acetaminophen-Caffeine (GOODYS EXTRA STRENGTH) 500-325-65 MG PACK Take 1 packet by mouth daily as needed (pain.).     atorvastatin (LIPITOR) 10 MG tablet TAKE 1 TABLET BY MOUTH  DAILY (Patient taking differently: Take 10 mg by mouth every evening.) 90 tablet 3   b complex vitamins capsule Take 1 capsule by mouth in the morning.     B Complex-C (B-COMPLEX WITH VITAMIN C) tablet Take 1 tablet by mouth in the morning.     Biotin 5000 MCG TABS Take 5,000 mcg by mouth in the morning.     chlorhexidine (PERIDEX) 0.12 % solution Use as directed 15 mLs in the mouth or throat daily.     cholecalciferol (VITAMIN D) 25 MCG (1000 UNIT) tablet Take 1,000 Units by mouth daily as needed (with vertigo).     citalopram (CELEXA) 20 MG tablet Take 20 mg by mouth in the morning and at bedtime.     cloNIDine (CATAPRES) 0.3 MG tablet Take 0.3 mg by mouth 2 (two) times daily.     ezetimibe (ZETIA) 10 MG tablet Take 10 mg by mouth 2 (two) times a week.     Ferrous Gluconate (IRON 27 PO) Take by mouth in the morning.     gabapentin (NEURONTIN) 800 MG tablet Take 1 tablet (800 mg total) by mouth 2 (two) times daily. (Patient taking differently: Take 400 mg by mouth 2 (two) times daily as needed (nerve pain.).) 180 tablet 3   hydrALAZINE (APRESOLINE) 25 MG tablet Take 1 tablet (25 mg total) by mouth 3 (three) times daily. 270 tablet 3   indapamide (LOZOL) 2.5 MG tablet Take 1 tablet (2.5 mg total) by mouth daily. 30 tablet 11   meclizine (ANTIVERT) 25 MG tablet Take 25 mg by mouth 3 (three) times daily as needed (vertigo).     methocarbamol (ROBAXIN) 750 MG tablet Take 1 tablet (750 mg total) by mouth 3 (three) times daily. (Patient taking differently: Take 750 mg by mouth every 8 (eight) hours as needed for muscle spasms.) 60 tablet 0   metoprolol  succinate (TOPROL-XL) 100 MG 24 hr tablet Take 50 mg by mouth in the morning. Take with or immediately following a meal.     mirabegron ER (MYRBETRIQ) 25 MG TB24 tablet Take 25 mg by mouth in the morning.     Multiple Vitamin (MULTIVITAMIN WITH MINERALS) TABS tablet  Take 1 tablet by mouth in the morning.     ondansetron (ZOFRAN) 8 MG tablet Take 8 mg by mouth 3 (three) times daily as needed for vomiting or nausea.     pantoprazole (PROTONIX) 40 MG tablet Take 40 mg by mouth daily before breakfast.     potassium chloride (KLOR-CON) 10 MEQ tablet Take 1 tablet (10 mEq total) by mouth daily. 90 tablet 3   spironolactone (ALDACTONE) 25 MG tablet Take 1 tablet (25 mg total) by mouth daily. 90 tablet 2   traMADol (ULTRAM) 50 MG tablet Take 1-2 tablets (50-100 mg total) by mouth every 6 (six) hours as needed for moderate pain. (Patient taking differently: Take 50 mg by mouth every 8 (eight) hours as needed (pain.).) 20 tablet 0   Vitamin A 2400 MCG (8000 UT) CAPS Take 2,400 mcg by mouth in the morning.     vitamin B-12 (CYANOCOBALAMIN) 1000 MCG tablet Take 1,000 mcg by mouth in the morning.     vitamin E 180 MG (400 UNITS) capsule Take 400 Units by mouth in the morning.     doxazosin (CARDURA) 8 MG tablet Take 8 mg by mouth daily.     metoprolol succinate (TOPROL-XL) 50 MG 24 hr tablet Take 3 tablets (150 mg total) by mouth daily. Take with or immediately following a meal. 90 tablet 1   nicotine (NICODERM CQ - DOSED IN MG/24 HOURS) 21 mg/24hr patch PLACE 1 PATCH (21 MG TOTAL) ONTO THE SKIN DAILY. 28 patch 0    No results found for this or any previous visit (from the past 48 hour(s)). No results found.  Review of Systems  There were no vitals taken for this visit. Physical Exam Constitutional:      Appearance: Normal appearance.  HENT:     Head: Normocephalic.  Eyes:     Pupils: Pupils are equal, round, and reactive to light.  Cardiovascular:     Rate and Rhythm: Normal rate and regular  rhythm.  Pulmonary:     Effort: Pulmonary effort is normal.     Breath sounds: Normal breath sounds.  Abdominal:     General: Abdomen is flat.     Palpations: Abdomen is soft.     Comments: Midline scar, ostomy on L  Musculoskeletal:        General: Normal range of motion.  Skin:    General: Skin is warm.     Capillary Refill: Capillary refill takes 2 to 3 seconds.  Neurological:     Mental Status: She is alert and oriented to person, place, and time.  Psychiatric:        Mood and Affect: Mood normal.      Assessment/Plan Colostomy in place - for colostomy takedown. Procedure, risks, and benefits discussed and she agrees.  Zenovia Jarred, MD 05/17/2022, 6:45 AM

## 2022-05-18 ENCOUNTER — Encounter (HOSPITAL_COMMUNITY): Payer: Self-pay | Admitting: General Surgery

## 2022-05-18 LAB — BASIC METABOLIC PANEL WITH GFR
Anion gap: 11 (ref 5–15)
BUN: 18 mg/dL (ref 8–23)
CO2: 26 mmol/L (ref 22–32)
Calcium: 9.2 mg/dL (ref 8.9–10.3)
Chloride: 100 mmol/L (ref 98–111)
Creatinine, Ser: 1.29 mg/dL — ABNORMAL HIGH (ref 0.44–1.00)
GFR, Estimated: 45 mL/min — ABNORMAL LOW
Glucose, Bld: 109 mg/dL — ABNORMAL HIGH (ref 70–99)
Potassium: 3.7 mmol/L (ref 3.5–5.1)
Sodium: 137 mmol/L (ref 135–145)

## 2022-05-18 LAB — CBC
HCT: 38.2 % (ref 36.0–46.0)
Hemoglobin: 12.2 g/dL (ref 12.0–15.0)
MCH: 28.6 pg (ref 26.0–34.0)
MCHC: 31.9 g/dL (ref 30.0–36.0)
MCV: 89.5 fL (ref 80.0–100.0)
Platelets: 272 K/uL (ref 150–400)
RBC: 4.27 MIL/uL (ref 3.87–5.11)
RDW: 14.9 % (ref 11.5–15.5)
WBC: 15.2 K/uL — ABNORMAL HIGH (ref 4.0–10.5)
nRBC: 0 % (ref 0.0–0.2)

## 2022-05-18 NOTE — Evaluation (Signed)
Occupational Therapy Evaluation Patient Details Name: Chelsea [name] MRN: [id number] DOB: December 18, 1950 Today's Date: 05/18/2022   History of Present Illness Patient is a 71 y/o female who presents for colostomy takedown, s/p partial colectomy and Hartman's procedure 05/17/22. PMH includes HTN, CKD, CVA with residual left sided weakness, depression, anxiety.   Clinical Impression   PTA, pt lives with family, typically ambulatory with a cane, and receives light assist from aide/family for certain ADLs. Pt presents now with significant abdominal pain though participatory. Emphasis on abdominal precautions for comfort, gentle stretches for trunk flexibility and progression of activity tolerance. Currently, pt requires up to Mod A for bed mobility, Min A for UB ADLs and Mod-Max A for LB ADLs. Anticipate once pain more controlled that pt will progress to PLOF quickly without need for OT follow up at DC. Will continue to follow acutely to progress safety and endurance with daily tasks.       Recommendations for follow up therapy are one component of a multi-disciplinary discharge planning process, led by the attending physician.  Recommendations may be updated based on patient status, additional functional criteria and insurance authorization.   Follow Up Recommendations  No OT follow up    Assistance Recommended at Discharge Intermittent Supervision/Assistance  Patient can return home with the following A little help with bathing/dressing/bathroom;Assistance with cooking/housework    Functional Status Assessment  Patient has had a recent decline in their functional status and demonstrates the ability to make significant improvements in function in a reasonable and predictable amount of time.  Equipment Recommendations  None recommended by OT    Recommendations for Other Services       Precautions / Restrictions Precautions Precautions: Fall;Other (comment) Precaution Comments: left  hemiparesis, abdominal precautions for comfort Restrictions Weight Bearing Restrictions: No      Mobility Bed Mobility Overal bed mobility: Needs Assistance Bed Mobility: Rolling, Sidelying to Sit, Sit to Sidelying Rolling: Min guard Sidelying to sit: Mod assist, HOB elevated     Sit to sidelying: Min assist General bed mobility comments: Mod A to lift trunk to EOB and Min A to get BLE back into bed. reinforced log rolling    Transfers                          Balance Overall balance assessment: Needs assistance Sitting-balance support: Feet supported, Bilateral upper extremity supported Sitting balance-Leahy Scale: Fair                                     ADL either performed or assessed with clinical judgement   ADL Overall ADL's : Needs assistance/impaired Eating/Feeding: Set up;Bed level Eating/Feeding Details (indicate cue type and reason): assist to open containers due to L sided weakness Grooming: Set up;Sitting   Upper Body Bathing: Minimal assistance;Sitting   Lower Body Bathing: Moderate assistance;Sit to/from stand   Upper Body Dressing : Minimal assistance;Sitting   Lower Body Dressing: Maximal assistance;Sit to/from stand       Toileting- Water quality scientist and Hygiene: Moderate assistance;Sit to/from stand;Sitting/lateral lean         General ADL Comments: Emphasis on abdominal precautions for comfort, importance of trunk stretching and gradual progression of mobility. pain limited today     Vision Baseline Vision/History: 1 Wears glasses Ability to See in Adequate Light: 0 Adequate Vision Assessment?: No apparent visual deficits     Perception  Praxis      Pertinent Vitals/Pain Pain Assessment Pain Assessment: Faces Faces Pain Scale: Hurts whole lot Pain Location: abdomen Pain Descriptors / Indicators: Sore, Operative site guarding, Discomfort Pain Intervention(s): Monitored during session, Patient  requesting pain meds-RN notified     Hand Dominance Right   Extremity/Trunk Assessment Upper Extremity Assessment Upper Extremity Assessment: LUE deficits/detail LUE Deficits / Details: Residual left sided weakness from CVA in 2007, some incoordination and weakness noted. tremulous at times LUE Coordination: decreased fine motor   Lower Extremity Assessment Lower Extremity Assessment: Defer to PT evaluation LLE Deficits / Details: Residual left sided weakness from CVA in 2007, some weakness in knee but functiona;   Cervical / Trunk Assessment Cervical / Trunk Assessment: Other exceptions Cervical / Trunk Exceptions: abdominal precautions   Communication Communication Communication: No difficulties   Cognition Arousal/Alertness: Awake/alert Behavior During Therapy: WFL for tasks assessed/performed Overall Cognitive Status: Within Functional Limits for tasks assessed                                       General Comments  Bandage with mild drainage on abdomen    Exercises     Shoulder Instructions      Home Living Family/patient expects to be discharged to:: Private residence Living Arrangements: Children;Other relatives (daughter, son in law, and son) Available Help at Discharge: Family;Available 24 hours/day Type of Home: House Home Access: Stairs to enter [employer] of Steps: 1 Entrance Stairs-Rails: None Home Layout: Multi-level;Bed/bath upstairs Alternate Level Stairs-Number of Steps: flight Alternate Level Stairs-Rails: Left Bathroom Shower/Tub: Teacher, early years/pre: Standard     Home Equipment: Rollator (4 wheels);Cane - quad;Shower seat;Toilet riser;Rolling Walker (2 wheels)          Prior Functioning/Environment Prior Level of Function : Needs assist       Physical Assist : ADLs (physical)   ADLs (physical): Bathing;Dressing;IADLs Mobility Comments: pt uses quad cane or rollator, able to complete stairs  without assist. Denies falls but reports L LE shakiness at baseline due to CVA ADLs Comments: pt does some cleaning, reports having an aide that assists Tues-Fri for 2.5 hours for bathing (showering)/dressing. However, pt reports she can dress herself usually without assist. Daughter's fiance does IADLs        OT Problem List: Decreased strength;Decreased activity tolerance;Impaired balance (sitting and/or standing);Pain      OT Treatment/Interventions: Self-care/ADL training;Therapeutic exercise;Energy conservation;DME and/or AE instruction;Therapeutic activities;Patient/family education;Balance training    OT Goals(Current goals can be found in the care plan section) Acute Rehab OT Goals Patient Stated Goal: pain control OT Goal Formulation: With patient Time For Goal Achievement: 06/01/22 Potential to Achieve Goals: Good  OT Frequency: Min 2X/week    Co-evaluation              AM-PAC OT "6 Clicks" Daily Activity     Outcome Measure Help from another person eating meals?: A Little Help from another person taking care of personal grooming?: A Little Help from another person toileting, which includes using toliet, bedpan, or urinal?: A Lot Help from another person bathing (including washing, rinsing, drying)?: A Lot Help from another person to put on and taking off regular upper body clothing?: A Little Help from another person to put on and taking off regular lower body clothing?: A Lot 6 Click Score: 15   End of Session Nurse Communication: Mobility status;Patient requests pain meds  Activity Tolerance: Patient tolerated treatment well;Patient limited by pain Patient left: in bed;with call bell/phone within reach;with bed alarm set  OT Visit Diagnosis: Unsteadiness on feet (R26.81);Other abnormalities of gait and mobility (R26.89);Pain Pain - part of body:  (abdomen)                Time: 8102-5486 OT Time Calculation (min): 38 min Charges:  OT General Charges $OT  Visit: 1 Visit OT Evaluation $OT Eval Moderate Complexity: 1 Mod OT Treatments $Therapeutic Activity: 8-22 mins  Malachy Chamber, OTR/L Acute Rehab Services Office: [phone]   Chelsea [name] 05/18/2022, 3:14 PM

## 2022-05-18 NOTE — Progress Notes (Signed)
Patient ID: Chelsea [name], female   DOB: [birth year], 71 y.o.   MRN: [id number] 1 Day Post-Op    Subjective: Passed flatus several times ROS negative except as listed above. Objective: Vital signs in last 24 hours: Temp:  [98.1 F (36.7 C)-100.5 F (38.1 C)] 98.1 F (36.7 C) (06/20 0429) Pulse Rate:  [69-100] 84 (06/20 0429) Resp:  [10-18] 18 (06/20 0429) BP: (98-144)/(55-85) 120/73 (06/20 0429) SpO2:  [91 %-100 %] 91 % (06/20 0429)    Intake/Output from previous day: 06/19 0701 - 06/20 0700 In: 800 [I.V.:700; IV Piggyback:100] Out: 130 [Urine:100; Blood:30] Intake/Output this shift: No intake/output data recorded.  General appearance: alert and cooperative Resp: clear to auscultation bilaterally Cardio: regular rate and rhythm GI: soft, dressings OK  Lab Results: CBC  Recent Labs    05/18/22 0339  WBC 15.2*  HGB 12.2  HCT 38.2  PLT 272   BMET Recent Labs    05/18/22 0339  NA 137  K 3.7  CL 100  CO2 26  GLUCOSE 109*  BUN 18  CREATININE 1.29*  CALCIUM 9.2   PT/INR No results for input(s): "LABPROT", "INR" in the last 72 hours. ABG No results for input(s): "PHART", "HCO3" in the last 72 hours.  Invalid input(s): "PCO2", "PO2"  Studies/Results: No results found.  Anti-infectives: Anti-infectives (From admission, onward)    Start     Dose/Rate Route Frequency Ordered Stop   05/17/22 0645  cefoTEtan (CEFOTAN) 2 g in sodium chloride 0.9 % 100 mL IVPB        2 g 200 mL/hr over 30 Minutes Intravenous On call to O.R. 05/17/22 0637 05/17/22 0753       Assessment/Plan: S/P colostomy reversal 6/19 - clears - PT/OT - decrease IVF some VTE - Lovenox Dispo - therapies, AROBF   LOS: 1 day    Georganna Skeans, MD, MPH, FACS Trauma & General Surgery Use [email or web address] to contact on call provider  05/18/2022

## 2022-05-18 NOTE — Plan of Care (Signed)
  Problem: Education: Goal: Knowledge of General Education information will improve Description: Including pain rating scale, medication(s)/side effects and non-pharmacologic comfort measures Outcome: Progressing   Problem: Clinical Measurements: Goal: Ability to maintain clinical measurements within normal limits will improve Outcome: Progressing   Problem: Activity: Goal: Risk for activity intolerance will decrease Outcome: Progressing   Problem: Nutrition: Goal: Adequate nutrition will be maintained Outcome: Progressing   Problem: Elimination: Goal: Will not experience complications related to bowel motility Outcome: Progressing   Problem: Pain Managment: Goal: General experience of comfort will improve Outcome: Progressing   Problem: Skin Integrity: Goal: Risk for impaired skin integrity will decrease Outcome: Progressing

## 2022-05-18 NOTE — Evaluation (Signed)
Physical Therapy Evaluation Patient Details Name: Chelsea [name] MRN: [id number] DOB: Mar 26, 1951 Today's Date: 05/18/2022  History of Present Illness  Patient is a 71 y/o female who presents for colostomy takedown, s/p partial colectomy and Hartman's procedure 05/17/22. PMH includes HTN, CKD, CVA with residual left sided weakness, depression, anxiety.  Clinical Impression  Patient presents with pain, residual left sided deficits and post surgical deficits s/p above surgery. Pt is from home with children and reports needing some assist for ADLs and for IADLs at baseline, using quad cane vs rollator for mobility.  Pt has an aide assist with bathing/dressing Tues-Friday. Today PT eval limited due to pain despite being pre medicated. Requires Min A for bed mobility, standing and taking a few steps along side bed. Reviewed importance of mobility with regards to awakening the bowels, preventing ileus, PNA and effects of immobility. Encouraged OOB transfer to [facility] for toileting. Will follow acutely to maximize independence and mobility prior to return home.     Recommendations for follow up therapy are one component of a multi-disciplinary discharge planning process, led by the attending physician.  Recommendations may be updated based on patient status, additional functional criteria and insurance authorization.  Follow Up Recommendations Home health PT    Assistance Recommended at Discharge Intermittent Supervision/Assistance  Patient can return home with the following  A little help with walking and/or transfers;A little help with bathing/dressing/bathroom;Help with stairs or ramp for entrance;Assist for transportation;Assistance with cooking/housework    Equipment Recommendations None recommended by PT  Recommendations for Other Services       Functional Status Assessment Patient has had a recent decline in their functional status and demonstrates the ability to make significant improvements  in function in a reasonable and predictable amount of time.     Precautions / Restrictions Precautions Precautions: Fall;Other (comment) Precaution Comments: left hemi Restrictions Weight Bearing Restrictions: No      Mobility  Bed Mobility Overal bed mobility: Needs Assistance Bed Mobility: Rolling, Sidelying to Sit Rolling: Min guard Sidelying to sit: Min assist, HOB elevated       General bed mobility comments: Cues for log roll technique for abdomen comfort. Use of rail, assist with trunk to get to EOB.    Transfers Overall transfer level: Needs assistance Equipment used: Quad cane Transfers: Sit to/from Stand Sit to Stand: Min assist           General transfer comment: Min A to steady in standing. Slow to rise but able to get upright, reaching for bedrail for support.    Ambulation/Gait Ambulation/Gait assistance: Min assist Gait Distance (Feet): 3 Feet Assistive device: 1 person hand held assist   Gait velocity: decreased     General Gait Details: Able to side step along side bed with MIn A for balance, limited due to pain in abdomen.  Stairs            Wheelchair Mobility    Modified Rankin (Stroke Patients Only)       Balance Overall balance assessment: Needs assistance Sitting-balance support: Feet supported, Bilateral upper extremity supported Sitting balance-Leahy Scale: Fair Sitting balance - Comments: Prefers BUE support as position of comfort due to pain in abdomen Postural control: Posterior lean Standing balance support: During functional activity Standing balance-Leahy Scale: Poor Standing balance comment: Requires external support for standing.                             Pertinent Vitals/Pain  Pain Assessment Pain Assessment: Faces Faces Pain Scale: Hurts whole lot Pain Location: abdomen Pain Descriptors / Indicators: Sore, Operative site guarding, Discomfort Pain Intervention(s): Monitored during session,  Repositioned, Premedicated before session, Limited activity within patient's tolerance    Home Living Family/patient expects to be discharged to:: Private residence Living Arrangements: Children;Other relatives (daughter, son and son in law) Available Help at Discharge: Family;Available 24 hours/day Type of Home: House Home Access: Stairs to enter Entrance Stairs-Rails: None Entrance Stairs-Number of Steps: 1 Alternate Level Stairs-Number of Steps: flight Home Layout: Multi-level;Bed/bath upstairs Home Equipment: Rollator (4 wheels);Cane - quad;Shower seat;Toilet riser;Rolling Walker (2 wheels)      Prior Function Prior Level of Function : Needs assist       Physical Assist : ADLs (physical)   ADLs (physical): Bathing;Dressing;IADLs Mobility Comments: pt uses quad cane or rollator, able to complete stairs without assist. Denies falls but reports L LE shakiness at baseline due to CVA ADLs Comments: pt does some cleaning, reports having an aide that assists Tues-Fri for 2.5 hours for bathing (showering)/dressing. Daughter's fiance does IADLs     Hand Dominance   Dominant Hand: Right    Extremity/Trunk Assessment   Upper Extremity Assessment Upper Extremity Assessment: Defer to OT evaluation;LUE deficits/detail LUE Deficits / Details: Residual left sided weakness from CVA in 2007, some incoordination and weakness noted.    Lower Extremity Assessment Lower Extremity Assessment: LLE deficits/detail LLE Deficits / Details: Residual left sided weakness from CVA in 2007, some weakness in knee but functiona;       Communication   Communication: No difficulties  Cognition Arousal/Alertness: Awake/alert Behavior During Therapy: WFL for tasks assessed/performed Overall Cognitive Status: Within Functional Limits for tasks assessed                                          General Comments General comments (skin integrity, edema, etc.): Bandage with mild drainage  on abdomen    Exercises     Assessment/Plan    PT Assessment Patient needs continued PT services  PT Problem List Decreased strength;Decreased mobility;Pain;Decreased balance;Decreased skin integrity;Decreased coordination       PT Treatment Interventions Therapeutic activities;Gait training;Stair training;Therapeutic exercise;Patient/family education;DME instruction;Balance training;Functional mobility training    PT Goals (Current goals can be found in the Care Plan section)  Acute Rehab PT Goals Patient Stated Goal: decrease pain, "move at my own pace" PT Goal Formulation: With patient Time For Goal Achievement: 06/01/22 Potential to Achieve Goals: Good    Frequency Min 3X/week     Co-evaluation               AM-PAC PT "6 Clicks" Mobility  Outcome Measure Help needed turning from your back to your side while in a flat bed without using bedrails?: A Little Help needed moving from lying on your back to sitting on the side of a flat bed without using bedrails?: A Little Help needed moving to and from a bed to a chair (including a wheelchair)?: A Little Help needed standing up from a chair using your arms (e.g., wheelchair or bedside chair)?: A Little Help needed to walk in hospital room?: A Little Help needed climbing 3-5 steps with a railing? : A Little 6 Click Score: 18    End of Session   Activity Tolerance: Patient limited by pain Patient left: in bed;with call bell/phone within reach;with bed alarm set Nurse  Communication: Mobility status PT Visit Diagnosis: Pain;Muscle weakness (generalized) (M62.81);Difficulty in walking, not elsewhere classified (R26.2);Unsteadiness on feet (R26.81) Pain - part of body:  (abdomen)    Time: 4734-0370 PT Time Calculation (min) (ACUTE ONLY): 25 min   Charges:   PT Evaluation $PT Eval Moderate Complexity: 1 Mod PT Treatments $Therapeutic Activity: 8-22 mins        Marisa Severin, PT, DPT Acute Rehabilitation  Services Secure chat preferred Office [place] 05/18/2022, 2:14 PM

## 2022-05-19 LAB — BASIC METABOLIC PANEL
Anion gap: 7 (ref 5–15)
BUN: 14 mg/dL (ref 8–23)
CO2: 28 mmol/L (ref 22–32)
Calcium: 9 mg/dL (ref 8.9–10.3)
Chloride: 101 mmol/L (ref 98–111)
Creatinine, Ser: 1.02 mg/dL — ABNORMAL HIGH (ref 0.44–1.00)
GFR, Estimated: 59 mL/min — ABNORMAL LOW (ref >60)
Glucose, Bld: 102 mg/dL — ABNORMAL HIGH (ref 70–99)
Potassium: 3.7 mmol/L (ref 3.5–5.1)
Sodium: 136 mmol/L (ref 135–145)

## 2022-05-19 LAB — CBC
HCT: 34.6 % — ABNORMAL LOW (ref 36.0–46.0)
Hemoglobin: 10.9 g/dL — ABNORMAL LOW (ref 12.0–15.0)
MCH: 28.8 pg (ref 26.0–34.0)
MCHC: 31.5 g/dL (ref 30.0–36.0)
MCV: 91.5 fL (ref 80.0–100.0)
Platelets: 203 10*3/uL (ref 150–400)
RBC: 3.78 MIL/uL — ABNORMAL LOW (ref 3.87–5.11)
RDW: 14.7 % (ref 11.5–15.5)
WBC: 8 10*3/uL (ref 4.0–10.5)
nRBC: 0 % (ref 0.0–0.2)

## 2022-05-19 LAB — SURGICAL PATHOLOGY

## 2022-05-19 MED ORDER — PROCHLORPERAZINE EDISYLATE 10 MG/2ML IJ SOLN: 10.0000 mg | Freq: Four times a day (QID) | INTRAMUSCULAR | Status: DC | PRN

## 2022-05-19 NOTE — Progress Notes (Signed)
PT Cancellation Note  Patient Details Name: Chelsea [name] MRN: [id number] DOB: 1951/01/14   Cancelled Treatment:    Reason Eval/Treat Not Completed: Other (comment). PT attempted x 2. Pt with c/o nausea both attempts. LPN administered nausea meds but nausea persists. Pt requesting PT come back tomorrow.   Lorriane Shire 05/19/2022, 10:10 AM

## 2022-05-19 NOTE — Progress Notes (Signed)
Patient ID: Chelsea [name], female   DOB: July 01, 1951, 71 y.o.   MRN: [id number] 2 Days Post-Op    Subjective: Vomited a couple times this AM, still passing flatus ROS negative except as listed above. Objective: Vital signs in last 24 hours: Temp:  [97.8 F (36.6 C)-98.9 F (37.2 C)] 98.1 F (36.7 C) (06/21 0750) Pulse Rate:  [71-79] 79 (06/21 0750) Resp:  [17-18] 18 (06/21 0750) BP: (114-175)/(71-88) 164/88 (06/21 0750) SpO2:  [92 %-97 %] 97 % (06/21 0750) Last BM Date :  (PTA)  Intake/Output from previous day: 06/20 0701 - 06/21 0700 In: -  Out: 1700 [Urine:1700] Intake/Output this shift: No intake/output data recorded.  General appearance: alert and cooperative Resp: clear to auscultation bilaterally Cardio: regular rate and rhythm GI: soft, mild dist, dry staining on dressing Extremities: calves soft  Lab Results: CBC  Recent Labs    05/18/22 0339 05/19/22 0117  WBC 15.2* 8.0  HGB 12.2 10.9*  HCT 38.2 34.6*  PLT 272 203   BMET Recent Labs    05/18/22 0339 05/19/22 0117  NA 137 136  K 3.7 3.7  CL 100 101  CO2 26 28  GLUCOSE 109* 102*  BUN 18 14  CREATININE 1.29* 1.02*  CALCIUM 9.2 9.0   PT/INR No results for input(s): "LABPROT", "INR" in the last 72 hours. ABG No results for input(s): "PHART", "HCO3" in the last 72 hours.  Invalid input(s): "PCO2", "PO2"  Studies/Results: No results found.  Anti-infectives: Anti-infectives (From admission, onward)    Start     Dose/Rate Route Frequency Ordered Stop   05/17/22 0645  cefoTEtan (CEFOTAN) 2 g in sodium chloride 0.9 % 100 mL IVPB        2 g 200 mL/hr over 30 Minutes Intravenous On call to O.R. 05/17/22 0637 05/17/22 0753       Assessment/Plan: S/P colostomy reversal 6/19 - expected post-op ileus - sips of clears as able - did well with PT/OT - continue IVF VTE - Lovenox Dispo - therapies, AROBF   LOS: 2 days    Georganna Skeans, MD, MPH, FACS Trauma & General Surgery Use  [email or web address] to contact on call provider  05/19/2022

## 2022-05-20 NOTE — Plan of Care (Signed)

## 2022-05-20 NOTE — Progress Notes (Signed)
OT Cancellation Note  Patient Details Name: Chelsea [name] MRN: [id number] DOB: 1951/11/15   Cancelled Treatment:    Reason Eval/Treat Not Completed: Fatigue/lethargy limiting ability to participate Pt sleeping soundly on entry, did not arouse to voice. Will follow up for OT session as schedule permits, likely tomorrow 6/23.  Layla Maw 05/20/2022, 2:24 PM

## 2022-05-20 NOTE — Progress Notes (Signed)
Physical Therapy Treatment Patient Details Name: Chelsea [name] MRN: [id number] DOB: 05-14-1951 Today's Date: 05/20/2022   History of Present Illness Patient is a 71 y/o female who presents for colostomy takedown, s/p partial colectomy and Hartman's procedure 05/17/22. PMH includes HTN, CKD, CVA with residual left sided weakness, depression, anxiety.    PT Comments    Pt admitted with above diagnosis. Pt progressing well. Needs RW for home use as she is safer with this currently.  Pt will also need HHPT.  Will continue to progress pt as able.  Limited by pain at times.  Pt currently with functional limitations due to balance and endurance deficits. Pt will benefit from skilled PT to increase their independence and safety with mobility to allow discharge to the venue listed below.      Recommendations for follow up therapy are one component of a multi-disciplinary discharge planning process, led by the attending physician.  Recommendations may be updated based on patient status, additional functional criteria and insurance authorization.  Follow Up Recommendations  Home health PT     Assistance Recommended at Discharge Intermittent Supervision/Assistance  Patient can return home with the following A little help with walking and/or transfers;A little help with bathing/dressing/bathroom;Help with stairs or ramp for entrance;Assist for transportation;Assistance with cooking/housework   Equipment Recommendations  Rolling walker (2 wheels);BSC/3in1    Recommendations for Other Services       Precautions / Restrictions Precautions Precautions: Fall;Other (comment) Precaution Comments: left hemiparesis, abdominal precautions for comfort Restrictions Weight Bearing Restrictions: No     Mobility  Bed Mobility Overal bed mobility: Needs Assistance Bed Mobility: Rolling, Sit to Sidelying         Sit to sidelying: Supervision General bed mobility comments: No assist to lie down     Transfers Overall transfer level: Needs assistance Equipment used: Rolling walker (2 wheels) Transfers: Sit to/from Stand Sit to Stand: Supervision, Min guard           General transfer comment: Min guard A for standing. Slow to rise but able to get upright to RW and placed her right hand on RW on her own. STates she feels safer with the RW than the quad cane currently.    Ambulation/Gait Ambulation/Gait assistance: Min assist, Min guard Gait Distance (Feet): 150 Feet Assistive device: Rolling walker (2 wheels) Gait Pattern/deviations: Step-through pattern, Decreased stride length, Trunk flexed Gait velocity: decreased Gait velocity interpretation: <1.31 ft/sec, indicative of household ambulator   General Gait Details: MIn A for balance, limited due to pain in abdomen.Pt needed cues occasionally to stay in RW as well as to take larger steps.   Stairs             Wheelchair Mobility    Modified Rankin (Stroke Patients Only)       Balance Overall balance assessment: Needs assistance Sitting-balance support: Feet supported, Bilateral upper extremity supported Sitting balance-Leahy Scale: Fair Sitting balance - Comments: Prefers BUE support as position of comfort due to pain in abdomen   Standing balance support: During functional activity, Bilateral upper extremity supported Standing balance-Leahy Scale: Poor Standing balance comment: Requires external support for standing as well as RW.                            Cognition Arousal/Alertness: Awake/alert Behavior During Therapy: WFL for tasks assessed/performed Overall Cognitive Status: Within Functional Limits for tasks assessed  Exercises      General Comments        Pertinent Vitals/Pain Pain Assessment Pain Assessment: Faces Faces Pain Scale: Hurts whole lot Pain Location: abdomen Pain Descriptors / Indicators: Sore, Operative  site guarding, Discomfort Pain Intervention(s): Limited activity within patient's tolerance, Monitored during session, Repositioned    Home Living                          Prior Function            PT Goals (current goals can now be found in the care plan section) Acute Rehab PT Goals Patient Stated Goal: decrease pain, "move at my own pace" Progress towards PT goals: Progressing toward goals    Frequency    Min 3X/week      PT Plan Current plan remains appropriate    Co-evaluation              AM-PAC PT "6 Clicks" Mobility   Outcome Measure  Help needed turning from your back to your side while in a flat bed without using bedrails?: A Little Help needed moving from lying on your back to sitting on the side of a flat bed without using bedrails?: A Little Help needed moving to and from a bed to a chair (including a wheelchair)?: A Little Help needed standing up from a chair using your arms (e.g., wheelchair or bedside chair)?: A Little Help needed to walk in hospital room?: A Little Help needed climbing 3-5 steps with a railing? : A Little 6 Click Score: 18    End of Session Equipment Utilized During Treatment: Gait belt Activity Tolerance: Patient limited by fatigue Patient left: in bed;with call bell/phone within reach;with bed alarm set Nurse Communication: Mobility status PT Visit Diagnosis: Pain;Muscle weakness (generalized) (M62.81);Difficulty in walking, not elsewhere classified (R26.2);Unsteadiness on feet (R26.81) Pain - part of body:  (abdomen)     Time: 3220-2542 PT Time Calculation (min) (ACUTE ONLY): 17 min  Charges:  $Gait Training: 8-22 mins                     Iyauna Sing M,PT Acute Rehab Services [place] 05/20/2022, 2:19 PM

## 2022-05-20 NOTE — Progress Notes (Signed)
Mobility Specialist Progress Note:   05/20/22 1540  Mobility  Activity Transferred to/from [facility]  Level of Assistance Minimal assist, patient does 75% or more  Assistive Device Other (Comment) (HHA)  Distance Ambulated (ft) 2 ft  Activity Response Tolerated well  $Mobility charge 1 Mobility   Pt requesting to transfer to [facility]. Required minA via HHA to stand and pivot to [facility]. Pt left with call bell. Will f/u to return to bed.   Nelta Numbers Acute Rehab Secure Chat or Office Phone: [phone]

## 2022-05-20 NOTE — Progress Notes (Signed)
Mobility Specialist Progress Note:   05/20/22 1625  Mobility  Activity Transferred to/from [facility]  Level of Assistance Minimal assist, patient does 75% or more  Assistive Device Other (Comment) (HHA)  Distance Ambulated (ft) 2 ft  Activity Response Tolerated well  $Mobility charge 1 Mobility   Pt with successful small BM. Requesting to transfer back to bed. Required HHA throughout. Pt back in bed with all needs met, bed alarm on.   Nelta Numbers Acute Rehab Secure Chat or Office Phone: [phone]

## 2022-05-21 MED ORDER — TRAMADOL HCL 50 MG PO TABS: 50.0000 mg | ORAL_TABLET | Freq: Four times a day (QID) | ORAL | 1 refills | Status: AC | PRN

## 2022-05-21 NOTE — Progress Notes (Signed)
Pt's ride here to take her home. Walker not here yet but can be drop shipped. Pt states she would like it drop shipped.

## 2022-05-21 NOTE — TOC Progression Note (Signed)
Transition of Care [facility]) - Progression Note    Patient Details  Name: Veramae Stasik MRN: [id number] Date of Birth: Mar 04, 1951  Transition of [facility]) CM/SW Contact  Nadene Rubins Adria Devon, RN Phone Number: 05/21/2022, 8:09 AM  Clinical Narrative:     Maralyn Sago with [facility] aware discharge today. Asked MD for signed order.   Called [employer] for walker   Expected Discharge Plan: Home w Home Health Services Barriers to Discharge: Continued Medical Work up  Expected Discharge Plan and Services Expected Discharge Plan: Home w Home Health Services   Discharge Planning Services: CM Consult Post Acute Care Choice: Home Health Living arrangements for the past 2 months: Apartment Expected Discharge Date: 05/21/22               DME Arranged: Dan Humphreys rolling DME Agency: [employer] Date DME Agency Contacted: 05/20/22 Time DME Agency Contacted: [phone] Representative spoke with at DME Agency: Beola Cord HH Arranged: PT           Social Determinants of Health (SDOH) Interventions    Readmission Risk Interventions    11/13/2021    4:53 PM  Readmission Risk Prevention Plan  Transportation Screening Complete  PCP or Specialist Appt within 5-7 Days Complete  Home Care Screening Complete  Medication Review (RN CM) Complete

## 2022-05-21 NOTE — TOC Progression Note (Signed)
Transition of Care [facility]) - Progression Note    Patient Details  Name: Chelsea [name] MRN: [id number] Date of Birth: 03/19/1951  Transition of Care [facility]) CM/SW Contact  Nadene Rubins Adria Devon, RN Phone Number: 05/21/2022, 9:56 AM  Clinical Narrative:    Called [employer] for walker. [employer] spoke to patient. Patient received a rollator in 2020 , insurance will not cover a walker . Patient would need to private pay for walker. Patient declined walker per [employer]    Expected Discharge Plan: Home w Home Health Services Barriers to Discharge: Continued Medical Work up  Expected Discharge Plan and Services Expected Discharge Plan: Home w Home Health Services   Discharge Planning Services: CM Consult Post Acute Care Choice: Home Health Living arrangements for the past 2 months: Apartment Expected Discharge Date: 05/21/22               DME Arranged: Dan Humphreys rolling DME Agency: [employer] Date DME Agency Contacted: 05/20/22 Time DME Agency Contacted: [phone] Representative spoke with at DME Agency: Beola Cord HH Arranged: PT           Social Determinants of Health (SDOH) Interventions    Readmission Risk Interventions    11/13/2021    4:53 PM  Readmission Risk Prevention Plan  Transportation Screening Complete  PCP or Specialist Appt within 5-7 Days Complete  Home Care Screening Complete  Medication Review (RN CM) Complete

## 2022-05-21 NOTE — Plan of Care (Signed)
  Problem: Education: Goal: Knowledge of General Education information will improve Description: Including pain rating scale, medication(s)/side effects and non-pharmacologic comfort measures Outcome: Adequate for Discharge   Problem: Health Behavior/Discharge Planning: Goal: Ability to manage health-related needs will improve Outcome: Adequate for Discharge   Problem: Clinical Measurements: Goal: Ability to maintain clinical measurements within normal limits will improve Outcome: Adequate for Discharge Goal: Will remain free from infection Outcome: Adequate for Discharge Goal: Diagnostic test results will improve Outcome: Adequate for Discharge Goal: Respiratory complications will improve Outcome: Adequate for Discharge Goal: Cardiovascular complication will be avoided Outcome: Adequate for Discharge   Problem: Activity: Goal: Risk for activity intolerance will decrease Outcome: Adequate for Discharge   Problem: Nutrition: Goal: Adequate nutrition will be maintained Outcome: Adequate for Discharge   Problem: Coping: Goal: Level of anxiety will decrease Outcome: Adequate for Discharge   Problem: Elimination: Goal: Will not experience complications related to bowel motility Outcome: Adequate for Discharge Goal: Will not experience complications related to urinary retention Outcome: Adequate for Discharge   Problem: Pain Managment: Goal: General experience of comfort will improve Outcome: Adequate for Discharge   Problem: Safety: Goal: Ability to remain free from injury will improve Outcome: Adequate for Discharge   Problem: Skin Integrity: Goal: Risk for impaired skin integrity will decrease Outcome: Adequate for Discharge   Problem: Acute Rehab PT Goals(only PT should resolve) Goal: Pt will Roll Supine to Side Outcome: Adequate for Discharge Goal: Pt Will Go Supine/Side To Sit Outcome: Adequate for Discharge Goal: Pt Will Go Sit To Supine/Side Outcome:  Adequate for Discharge Goal: Patient Will Transfer Sit To/From Stand Outcome: Adequate for Discharge Goal: Pt Will Ambulate Outcome: Adequate for Discharge Goal: Pt Will Go Up/Down Stairs Outcome: Adequate for Discharge   Problem: Acute Rehab OT Goals (only OT should resolve) Goal: Pt. Will Perform Grooming Outcome: Adequate for Discharge Goal: Pt. Will Transfer To Toilet Outcome: Adequate for Discharge Goal: OT Additional ADL Goal #1 Outcome: Adequate for Discharge Goal: OT Additional ADL Goal #2 Outcome: Adequate for Discharge

## 2022-05-21 NOTE — Progress Notes (Signed)
Nsg Discharge Note  Admit Date:  05/17/2022 Discharge date: 05/21/2022   Kaydan Mancias to be D/C'd Home per MD order.  AVS completed.  Patient/caregiver able to verbalize understanding. Removed IV-CDI. Reviewed d/c paperwork with patient and answered all questions. Gave dressing supplies. Patient was supposed to leave with a walker but it did not come to her room and she was ready to leave. Heather in CM said she would have it delivered to her house. Stable patient and belongings were wheeled to main entrance where she was picked up by her friend to d/c to home. Discharge Medication: Allergies as of 05/21/2022       Reactions   Other Swelling   Hair dye, caused eye swelling, multiple times experienced this reaction   Hydrocodone Nausea And Vomiting   Oxycodone Nausea And Vomiting   Other reaction(s): Unknown        Medication List     TAKE these medications    amLODipine 10 MG tablet Commonly known as: NORVASC TAKE 1 TABLET (10 MG TOTAL) BY MOUTH DAILY FOR BLOOD PRESSURE What changed: See the new instructions.   aspirin EC 81 MG tablet Take 1 tablet (81 mg total) by mouth daily.   atorvastatin 10 MG tablet Commonly known as: LIPITOR TAKE 1 TABLET BY MOUTH  DAILY What changed: when to take this   b complex vitamins capsule Take 1 capsule by mouth in the morning.   B-complex with vitamin C tablet Take 1 tablet by mouth in the morning.   Biotin 5000 MCG Tabs Take 5,000 mcg by mouth in the morning.   chlorhexidine 0.12 % solution Commonly known as: PERIDEX Use as directed 15 mLs in the mouth or throat daily.   cholecalciferol 25 MCG (1000 UNIT) tablet Commonly known as: VITAMIN D Take 1,000 Units by mouth daily as needed (with vertigo).   citalopram 20 MG tablet Commonly known as: CELEXA Take 20 mg by mouth in the morning and at bedtime.   cloNIDine 0.3 MG tablet Commonly known as: CATAPRES Take 0.3 mg by mouth 2 (two) times daily.   doxazosin 8 MG  tablet Commonly known as: CARDURA Take 8 mg by mouth daily.   ezetimibe 10 MG tablet Commonly known as: ZETIA Take 10 mg by mouth 2 (two) times a week.   gabapentin 800 MG tablet Commonly known as: NEURONTIN Take 1 tablet (800 mg total) by mouth 2 (two) times daily. What changed:  how much to take when to take this reasons to take this   Goodys Extra Strength 500-325-65 MG Pack Generic drug: Aspirin-Acetaminophen-Caffeine Take 1 packet by mouth daily as needed (pain.).   hydrALAZINE 25 MG tablet Commonly known as: APRESOLINE Take 1 tablet (25 mg total) by mouth 3 (three) times daily.   indapamide 2.5 MG tablet Commonly known as: LOZOL Take 1 tablet (2.5 mg total) by mouth daily.   IRON 27 PO Take by mouth in the morning.   meclizine 25 MG tablet Commonly known as: ANTIVERT Take 25 mg by mouth 3 (three) times daily as needed (vertigo).   methocarbamol 750 MG tablet Commonly known as: ROBAXIN Take 1 tablet (750 mg total) by mouth 3 (three) times daily. What changed:  when to take this reasons to take this   metoprolol succinate 100 MG 24 hr tablet Commonly known as: TOPROL-XL Take 50 mg by mouth in the morning. Take with or immediately following a meal.   metoprolol succinate 50 MG 24 hr tablet Commonly known as: TOPROL-XL  Take 3 tablets (150 mg total) by mouth daily. Take with or immediately following a meal.   mirabegron ER 25 MG Tb24 tablet Commonly known as: MYRBETRIQ Take 25 mg by mouth in the morning.   multivitamin with minerals Tabs tablet Take 1 tablet by mouth in the morning.   nicotine 21 mg/24hr patch Commonly known as: NICODERM CQ - dosed in mg/24 hours PLACE 1 PATCH (21 MG TOTAL) ONTO THE SKIN DAILY.   ondansetron 8 MG tablet Commonly known as: ZOFRAN Take 8 mg by mouth 3 (three) times daily as needed for vomiting or nausea.   pantoprazole 40 MG tablet Commonly known as: PROTONIX Take 40 mg by mouth daily before breakfast.   potassium  chloride 10 MEQ tablet Commonly known as: KLOR-CON Take 1 tablet (10 mEq total) by mouth daily.   spironolactone 25 MG tablet Commonly known as: ALDACTONE Take 1 tablet (25 mg total) by mouth daily.   traMADol 50 MG tablet Commonly known as: ULTRAM Take 1 tablet (50 mg total) by mouth every 6 (six) hours as needed for severe pain. What changed:  how much to take reasons to take this   Vitamin A 2400 MCG (8000 UT) Caps Take 2,400 mcg by mouth in the morning.   vitamin B-12 1000 MCG tablet Commonly known as: CYANOCOBALAMIN Take 1,000 mcg by mouth in the morning.   vitamin C 1000 MG tablet Take 1,000 mg by mouth in the morning.   vitamin E 180 MG (400 UNITS) capsule Take 400 Units by mouth in the morning.               Durable Medical Equipment  (From admission, onward)           Start     Ordered   05/20/22 1250  For home use only DME Walker rolling  Once       Question Answer Comment  Walker: With 5 Inch Wheels   Patient needs a walker to treat with the following condition Weakness      05/20/22 1249              Discharge Care Instructions  (From admission, onward)           Start     Ordered   05/21/22 0000  Discharge wound care:       Comments: OK to leave open. May cover with gauze for comfort.   05/21/22 0723            Discharge Assessment: Vitals:   05/21/22 0456 05/21/22 0741  BP: 132/74 (!) 155/81  Pulse: (!) 55 (!) 57  Resp:  17  Temp: (!) 97.5 F (36.4 C) 98 F (36.7 C)  SpO2: 94% 98%   Skin clean, dry and intact without evidence of skin break down, no evidence of skin tears noted. IV catheter discontinued intact. Site without signs and symptoms of complications - no redness or edema noted at insertion site, patient denies c/o pain - only slight tenderness at site.  Dressing with slight pressure applied.  D/c Instructions-Education: Discharge instructions given to patient/family with verbalized understanding. D/c  education completed with patient/family including follow up instructions, medication list, d/c activities limitations if indicated, with other d/c instructions as indicated by MD - patient able to verbalize understanding, all questions fully answered. Patient instructed to return to ED, call 911, or call MD for any changes in condition.  Patient escorted via WC, and D/C home via private auto.  Karolee Ohs, RN  05/21/2022 1:11 PM

## 2022-06-15 DIAGNOSIS — R7309 Other abnormal glucose: Secondary | ICD-10-CM | POA: Diagnosis not present

## 2022-06-15 DIAGNOSIS — I10 Essential (primary) hypertension: Secondary | ICD-10-CM | POA: Diagnosis not present

## 2022-06-15 DIAGNOSIS — R600 Localized edema: Secondary | ICD-10-CM | POA: Diagnosis not present

## 2022-06-15 DIAGNOSIS — E782 Mixed hyperlipidemia: Secondary | ICD-10-CM | POA: Diagnosis not present

## 2022-06-15 DIAGNOSIS — K219 Gastro-esophageal reflux disease without esophagitis: Secondary | ICD-10-CM | POA: Diagnosis not present

## 2022-06-15 DIAGNOSIS — G44209 Tension-type headache, unspecified, not intractable: Secondary | ICD-10-CM | POA: Diagnosis not present

## 2022-07-02 ENCOUNTER — Other Ambulatory Visit: Payer: Self-pay | Admitting: Surgery

## 2022-07-02 DIAGNOSIS — I712 Thoracic aortic aneurysm, without rupture, unspecified: Secondary | ICD-10-CM

## 2022-07-14 ENCOUNTER — Ambulatory Visit (HOSPITAL_COMMUNITY): Payer: Medicare Other | Attending: Cardiology

## 2022-07-14 DIAGNOSIS — I351 Nonrheumatic aortic (valve) insufficiency: Secondary | ICD-10-CM | POA: Insufficient documentation

## 2022-07-14 LAB — ECHOCARDIOGRAM COMPLETE
AR max vel: 2.66 cm2
AV Area VTI: 2.58 cm2
AV Area mean vel: 2.53 cm2
AV Mean grad: 7 mmHg
AV Peak grad: 13 mmHg
Ao pk vel: 1.8 m/s
Area-P 1/2: 3.31 cm2
P 1/2 time: 348 msec
S' Lateral: 2.6 cm

## 2022-07-16 ENCOUNTER — Telehealth: Payer: Self-pay

## 2022-07-16 DIAGNOSIS — I10 Essential (primary) hypertension: Secondary | ICD-10-CM

## 2022-07-16 DIAGNOSIS — Z79899 Other long term (current) drug therapy: Secondary | ICD-10-CM

## 2022-07-16 MED ORDER — LOSARTAN POTASSIUM 50 MG PO TABS: 50.0000 mg | ORAL_TABLET | Freq: Every day | ORAL | 3 refills | Status: DC

## 2022-07-16 NOTE — Telephone Encounter (Signed)
Discussed echo results with patient.  Per Dr. Tamala Julian: Start Losartan 50 mg daily. Stop potassium supplement. Find out how the metoprolol is being taken? How much and when? Needs BMET 1 week after starting Losartan and stoping potassium. Needs OV in 4-6 weeks.  Losartan '50mg'$  QD ordered and sent to pharmacy of choice.   Patient confirms she is taking metoprolol succinate '100mg'$  QD (in the evening).  Medication list updated.  BMET ordered with lap appt scheduled for 07/26/22.  Patient verbalized understanding of the above.

## 2022-07-16 NOTE — Telephone Encounter (Signed)
-----   Message from Belva Crome, MD sent at 07/15/2022  5:46 PM EDT ----- Let the patient know the echo is stable. Start Losartan 50 mg daily. Stop potassium supplement. Find out how the metoprolol is being taken? How much and when? Needs BMET 1 week after starting Losartan and stoping potassium. Needs OV in 4-6 weeks. A copy will be sent to Stacie Glaze, DO

## 2022-07-26 ENCOUNTER — Ambulatory Visit: Payer: Medicare Other | Attending: Interventional Cardiology

## 2022-07-26 ENCOUNTER — Other Ambulatory Visit: Payer: Self-pay | Admitting: Interventional Cardiology

## 2022-07-26 DIAGNOSIS — Z79899 Other long term (current) drug therapy: Secondary | ICD-10-CM

## 2022-07-26 DIAGNOSIS — I10 Essential (primary) hypertension: Secondary | ICD-10-CM

## 2022-07-26 LAB — BASIC METABOLIC PANEL
BUN/Creatinine Ratio: 12 (ref 12–28)
BUN: 16 mg/dL (ref 8–27)
CO2: 30 mmol/L — ABNORMAL HIGH (ref 20–29)
Calcium: 10.1 mg/dL (ref 8.7–10.3)
Chloride: 99 mmol/L (ref 96–106)
Creatinine, Ser: 1.32 mg/dL — ABNORMAL HIGH (ref 0.57–1.00)
Glucose: 75 mg/dL (ref 70–99)
Potassium: 3 mmol/L — ABNORMAL LOW (ref 3.5–5.2)
Sodium: 145 mmol/L — ABNORMAL HIGH (ref 134–144)
eGFR: 43 mL/min/{1.73_m2} — ABNORMAL LOW (ref >59)

## 2022-07-26 NOTE — Telephone Encounter (Signed)
Patient came in today for labs and said that she needed her Metoprolol and Methocarbamol refilled.

## 2022-07-26 NOTE — Telephone Encounter (Signed)
Called pt and pt stated that she takes metoprolol 100 mg daily. I informed the pt that she would have to contact her PCP for a refill on methocarbamol. Would Dr. Tamala Julian like to refill metoprolol? Please address

## 2022-07-28 ENCOUNTER — Telehealth: Payer: Self-pay

## 2022-07-28 DIAGNOSIS — Z79899 Other long term (current) drug therapy: Secondary | ICD-10-CM

## 2022-07-28 DIAGNOSIS — I10 Essential (primary) hypertension: Secondary | ICD-10-CM

## 2022-07-28 MED ORDER — METOPROLOL SUCCINATE ER 100 MG PO TB24: 100.0000 mg | ORAL_TABLET | Freq: Every day | ORAL | 0 refills | Status: DC

## 2022-07-28 MED ORDER — POTASSIUM CHLORIDE CRYS ER 20 MEQ PO TBCR: 20.0000 meq | EXTENDED_RELEASE_TABLET | Freq: Every day | ORAL | 3 refills | Status: DC

## 2022-07-28 NOTE — Telephone Encounter (Signed)
Spoke with patient and discussed lab results with potassium of 3.0.  Per Dr. Tamala Julian: Prescribe potassium chloride 54mq QD with repeat BMET in 2 weeks.  Patient verbalized understanding, Rx sent to pharmacy of choice.  Patient states she will check her calendar and call our office to schedule lab appt for BMET (needed week of 08/09/22, BMET order in [facility].

## 2022-08-03 ENCOUNTER — Other Ambulatory Visit: Payer: Self-pay | Admitting: Interventional Cardiology

## 2022-08-04 ENCOUNTER — Encounter: Payer: Self-pay | Admitting: Interventional Cardiology

## 2022-08-04 ENCOUNTER — Ambulatory Visit: Payer: Medicare Other | Attending: Interventional Cardiology

## 2022-08-04 ENCOUNTER — Ambulatory Visit: Payer: Medicare Other | Attending: Interventional Cardiology | Admitting: Interventional Cardiology

## 2022-08-04 VITALS — BP 112/62 | HR 52 | Ht 67.0 in | Wt 168.8 lb

## 2022-08-04 DIAGNOSIS — I351 Nonrheumatic aortic (valve) insufficiency: Secondary | ICD-10-CM | POA: Diagnosis not present

## 2022-08-04 DIAGNOSIS — E7849 Other hyperlipidemia: Secondary | ICD-10-CM | POA: Diagnosis not present

## 2022-08-04 DIAGNOSIS — I719 Aortic aneurysm of unspecified site, without rupture: Secondary | ICD-10-CM

## 2022-08-04 DIAGNOSIS — I10 Essential (primary) hypertension: Secondary | ICD-10-CM | POA: Diagnosis not present

## 2022-08-04 DIAGNOSIS — I480 Paroxysmal atrial fibrillation: Secondary | ICD-10-CM

## 2022-08-04 MED ORDER — APIXABAN 5 MG PO TABS: 5.0000 mg | ORAL_TABLET | Freq: Two times a day (BID) | ORAL | 11 refills | Status: DC

## 2022-08-04 NOTE — Patient Instructions (Signed)
Medication Instructions:  Your physician has recommended you make the following change in your medication:   1) STOP aspirin 2) STOP Goodys powder 3) START apixaban (Eliquis) '5mg'$  twice daily  *If you need a refill on your cardiac medications before your next appointment, please call your pharmacy*  Lab Work: NONE  Testing/Procedures: Your physician has requested that you wear a Zio heart monitor for 2 weeks (14 days). This will be mailed to your home with instructions on how to apply the monitor and how to send it back in when finished.  Follow-Up: At [facility], you and your health needs are our priority.  As part of our continuing mission to provide you with exceptional heart care, we have created designated Provider Care Teams.  These Care Teams include your primary Cardiologist (physician) and Advanced Practice Providers (APPs -  Physician Assistants and Nurse Practitioners) who all work together to provide you with the care you need, when you need it.  Your next appointment:   4-6 week(s)  The format for your next appointment:   In Person  Provider:   Sinclair Grooms, MD    Other Instructions [place] Monitor Instructions  Your physician has requested you wear a ZIO patch monitor for 14 days.  This is a single patch monitor. [employer] supplies one patch monitor per enrollment. Additional stickers are not available. Please do not apply patch if you will be having a Nuclear Stress Test,  Echocardiogram, Cardiac CT, MRI, or Chest Xray during the period you would be wearing the  monitor. The patch cannot be worn during these tests. You cannot remove and re-apply the  ZIO XT patch monitor.  Your ZIO patch monitor will be mailed 3 day USPS to your address on file. It may take 3-5 days  to receive your monitor after you have been enrolled.  Once you have received your monitor, please review the enclosed instructions. Your monitor  has already been registered  assigning a specific monitor serial # to you.  Billing and Patient Assistance Program Information  We have supplied [employer] with any of your insurance information on file for billing purposes. [employer] offers a sliding scale Patient Assistance Program for patients that do not have  insurance, or whose insurance does not completely cover the cost of the ZIO monitor.  You must apply for the Patient Assistance Program to qualify for this discounted rate.  To apply, please call [employer] at [phone], select option 4, select option 2, ask to apply for  Patient Assistance Program. Theodore Demark will ask your household income, and how many people  are in your household. They will quote your out-of-pocket cost based on that information.  [employer] will also be able to set up a 104-month interest-free payment plan if needed.  Applying the monitor   Shave hair from upper left chest.  Hold abrader disc by orange tab. Rub abrader in 40 strokes over the upper left chest as  indicated in your monitor instructions.  Clean area with 4 enclosed alcohol pads. Let dry.  Apply patch as indicated in monitor instructions. Patch will be placed under collarbone on left  side of chest with arrow pointing upward.  Rub patch adhesive wings for 2 minutes. Remove white label marked "1". Remove the white  label marked "2". Rub patch adhesive wings for 2 additional minutes.  While looking in a mirror, press and release button in center of patch. A small green light will  flash 3-4 times.  This will be your only indicator that the monitor has been turned on.  Do not shower for the first 24 hours. You may shower after the first 24 hours.  Press the button if you feel a symptom. You will hear a small click. Record Date, Time and  Symptom in the Patient Logbook.  When you are ready to remove the patch, follow instructions on the last 2 pages of Patient  Logbook. Stick patch monitor onto the last page of Patient Logbook.  Place  Patient Logbook in the blue and white box. Use locking tab on box and tape box closed  securely. The blue and white box has prepaid postage on it. Please place it in the mailbox as  soon as possible. Your physician should have your test results approximately 7 days after the  monitor has been mailed back to [facility].  Call [place] at [phone] if you have questions regarding  your ZIO XT patch monitor. Call them immediately if you see an orange light blinking on your  monitor.  If your monitor falls off in less than 4 days, contact our Monitor department at [phone].  If your monitor becomes loose or falls off after 4 days call [employer] at [phone] for  suggestions on securing your monitor   Important Information About [employer]

## 2022-08-04 NOTE — Progress Notes (Signed)
Cardiology Office Note:    Date:  08/04/2022   ID:  Chelsea [name], DOB [birth year], MRN [id number]  PCP:  Trey Sailors, PA  Cardiologist:  Sinclair Grooms, MD   Referring MD: Trey Sailors, [place]   Chief Complaint  Patient presents with   Congestive Heart Failure   Atrial Fibrillation   Hypertension    History of Present Illness:    Chelsea [name] is a 71 y.o. female with a hx of mild aortic regurgitation, non-cardiac chest pain, prior history of stroke with residual left-sided weakness, hyperlipidemia, hypertension, and moderate obesity.   The patient feels well today.  In examining her I noticed an irregular and faster than expected heart rate.  I ordered an EKG and it demonstrated atrial fibrillation..  In reviewing the data within her chart, she has had atrial fibrillation previously documented by an EKG done in December 2022.  This was not noted by a cardiologist.   She does not feel any irregularity in rhythm.  States her energy level is good.,  Therefore there is no clue from symptoms that she is out of rhythm.  She denies chest pain, shortness of breath, orthopnea, and PND.  Past Medical History:  Diagnosis Date   Anxiety    Aortic aneurysm without rupture [facility])    followed by dr dr h. 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 [facility]   (nuclear stress test 01-31-20121 normal w/ ef 54%; cardaic cath 08-07-2010 no sig coronary obstruction, preserved LVF, mild AR   Multinodular goiter    followed by pcp,  last ultrasound in epic 03-20-2020 , never met critiria for bx   Nonrheumatic aortic (valve) insufficiency    followed by cardiologist--- dr h. 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,  followed by dr atRexene Alberts Urge incontinence    Wears glasses    Wears hearing aid in both ears    Wears partial dentures    upper and lower    Past Surgical History:  Procedure Laterality Date   ABDOMINAL HYSTERECTOMY     CARDIAC CATHETERIZATION  [place]07/2010   [place]  no sig coronary obstruction, preserved LVF, mild AR  COLON RESECTION SIGMOID N/A 11/08/2021   Procedure: COLON RESECTION SIGMOID; DRAINAGE OF INTRAABDOMINAL ABSCESS;  Surgeon: Georganna Skeans, MD;  Location: [place];  Service: General;  Laterality: N/A;   COLOSTOMY N/A 11/08/2021   Procedure: COLOSTOMY;  Surgeon: Georganna Skeans, MD;  Location: [place];  Service: General;  Laterality: N/A;   COLOSTOMY REVERSAL N/A 05/17/2022   Procedure: COLOSTOMY REVERSAL (HARTMAN);  Surgeon: Georganna Skeans, MD;  Location: [place];  Service: General;  Laterality: N/A;   MASS EXCISION N/A 06/04/2021   Procedure: EXCISION SUCUTANEOUS MASS X2, UPPER BACK;  Surgeon: Clovis Riley, MD;  Location: [place];  Service: General;  Laterality: N/A;   TOTAL KNEE ARTHROPLASTY Left 01/05/2016   Procedure: TOTAL KNEE ARTHROPLASTY;  Surgeon: Frederik Pear,  MD;  Location: [place];  Service: Orthopedics;  Laterality: Left;   TUBAL LIGATION Bilateral 2000    Current Medications: Current Meds  Medication Sig   amLODipine (NORVASC) 10 MG tablet TAKE 1 TABLET (10 MG TOTAL) BY MOUTH DAILY FOR BLOOD PRESSURE (Patient taking differently: Take 10 mg by mouth daily as needed (elevated bp 180/110).)   apixaban (ELIQUIS) 5 MG TABS tablet Take 1 tablet (5 mg total) by mouth 2 (two) times daily.   Ascorbic Acid (VITAMIN C) 1000 MG tablet Take 1,000 mg by mouth in the morning.   atorvastatin (LIPITOR) 10 MG tablet TAKE 1 TABLET BY MOUTH  DAILY (Patient taking differently: Take 10 mg by mouth every evening.)   b complex vitamins capsule Take 1 capsule by mouth in the morning.   B Complex-C (B-COMPLEX WITH VITAMIN C) tablet Take 1 tablet by mouth in the morning.   Biotin 5000 MCG TABS Take 5,000 mcg by mouth in the morning.   chlorhexidine (PERIDEX) 0.12 % solution Use as directed 15 mLs in the mouth or throat daily.   cholecalciferol (VITAMIN D) 25 MCG (1000 UNIT) tablet Take 1,000 Units by mouth daily as needed (with vertigo).   citalopram (CELEXA) 20 MG tablet Take 20 mg by mouth in the morning and at bedtime.   cloNIDine (CATAPRES) 0.3 MG tablet Take 0.3 mg by mouth 2 (two) times daily.   doxazosin (CARDURA) 8 MG tablet Take 8 mg by mouth daily.   ezetimibe (ZETIA) 10 MG tablet Take 10 mg by mouth 2 (two) times a week.   Ferrous Gluconate (IRON 27 PO) Take by mouth in the morning.   gabapentin (NEURONTIN) 800 MG tablet Take 1 tablet (800 mg total) by mouth 2 (two) times daily. (Patient taking differently: Take 400 mg by mouth 2 (two) times daily as needed (nerve pain.).)   hydrALAZINE (APRESOLINE) 25 MG tablet Take 1 tablet (25 mg total) by mouth 3 (three) times daily.   indapamide (LOZOL) 2.5 MG tablet Take 1 tablet (2.5 mg total) by mouth daily.   losartan (COZAAR) 50 MG tablet Take 1 tablet (50 mg total) by mouth daily.   meclizine (ANTIVERT) 25 MG tablet  Take 25 mg by mouth 3 (three) times daily as needed (vertigo).   methocarbamol (ROBAXIN) 750 MG tablet Take 1 tablet (750 mg total) by mouth 3 (three) times daily. (Patient taking differently: Take 750 mg by mouth every 8 (eight) hours as needed for muscle spasms.)   metoprolol succinate (TOPROL-XL) 100 MG 24 hr tablet Take 1 tablet (100 mg total) by mouth daily. Take with or immediately following a meal.   mirabegron ER (MYRBETRIQ) 25 MG TB24 tablet Take 25 mg by mouth in the morning.   Multiple Vitamin (  MULTIVITAMIN WITH MINERALS) TABS tablet Take 1 tablet by mouth in the morning.   nicotine (NICODERM CQ - DOSED IN MG/24 HOURS) 21 mg/24hr patch PLACE 1 PATCH (21 MG TOTAL) ONTO THE SKIN DAILY.   ondansetron (ZOFRAN) 8 MG tablet Take 8 mg by mouth 3 (three) times daily as needed for vomiting or nausea.   pantoprazole (PROTONIX) 40 MG tablet Take 40 mg by mouth daily before breakfast.   potassium chloride SA (KLOR-CON M) 20 MEQ tablet Take 1 tablet (20 mEq total) by mouth daily.   spironolactone (ALDACTONE) 25 MG tablet TAKE 1 TABLET (25 MG TOTAL) BY MOUTH DAILY.   traMADol (ULTRAM) 50 MG tablet Take 1 tablet (50 mg total) by mouth every 6 (six) hours as needed for severe pain.   Vitamin A 2400 MCG (8000 UT) CAPS Take 2,400 mcg by mouth in the morning.   vitamin B-12 (CYANOCOBALAMIN) 1000 MCG tablet Take 1,000 mcg by mouth in the morning.   vitamin E 180 MG (400 UNITS) capsule Take 400 Units by mouth in the morning.   [DISCONTINUED] aspirin EC 81 MG tablet Take 1 tablet (81 mg total) by mouth daily.   [DISCONTINUED] Aspirin-Acetaminophen-Caffeine (GOODYS EXTRA STRENGTH) 500-325-65 MG PACK Take 1 packet by mouth daily as needed (pain.).     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  Tobacco Use   Smoking status: Former    Packs/day: 1.00    Years: 50.00    Total pack years: 50.00    Types: Cigarettes    Quit date: 10/29/2021    Years since quitting: 0.7   Smokeless tobacco: Never  Vaping Use   Vaping Use: Never used  Substance and Sexual Activity   Alcohol use: No    Alcohol/week: 0.0 standard drinks of alcohol   Drug use: No   Sexual activity: Never  Other Topics Concern   Not on file  Social History Narrative   Patient consumes no caffeine   Social Determinants of Health   Financial Resource Strain: Not on file  Food Insecurity: Not on file  Transportation Needs: Not on file  Physical Activity: Not on file  Stress: Not on file  Social Connections: Not on file     Family History: The patient's family history includes Cancer in her brother; Cirrhosis in her father; Drug abuse in her brother; Heart attack in an other family member; Hypertension in her mother; Peripheral vascular disease in her mother. There is no history of Breast cancer.  ROS:   Please see the history of present illness.    No other complaints.  No bleeding complaints.  All other systems reviewed and are negative.  EKGs/Labs/Other Studies Reviewed:    The following studies were reviewed today: 2 D Doppler ECHOCARDIOGRAN 06/2022: IMPRESSIONS    1. Left ventricular ejection fraction, by estimation, is 50 to 55%. The  left ventricle has normal function. The left ventricle demonstrates  regional wall motion abnormalities, mild septal hypokinesis. Left  ventricular diastolic parameters are consistent   with Grade I diastolic dysfunction (impaired relaxation).   2. Right ventricular systolic function is normal. The right ventricular  size is  normal. There is normal pulmonary artery systolic pressure. The  estimated right ventricular systolic pressure is 47.0 mmHg.   3. Left atrial size was  mildly dilated.   4. Right atrial size was mildly dilated.   5. The mitral valve is normal in structure. Trivial mitral valve  regurgitation. No evidence of mitral stenosis.   6. The aortic valve is tricuspid. Aortic valve regurgitation is moderate,  no holodiastolic flow reversal in the descending thoracic aorta. No aortic  stenosis is present.   7. Aortic dilatation noted. There is moderate dilatation of the ascending  aorta, measuring 44 mm.   8. The inferior vena cava is normal in size with greater than 50%  respiratory variability, suggesting right atrial pressure of 3 mmHg.      CHEST CT 01/26/2022: IMPRESSION: Vascular:   1. Interval increased size of fusiform ascending thoracic aortic aneurysm which now measures up to 4.4 cm, previously 4.0 cm. Recommend annual imaging followup by CTA or MRA. This recommendation follows 2010 ACCF/AHA/AATS/ACR/ASA/SCA/SCAI/SIR/STS/SVM Guidelines for the Diagnosis and Management of Patients with Thoracic Aortic Disease. Circulation. 2010; 121: J628-Z662. Aortic aneurysm NOS (ICD10-I71.9) 2. Unchanged global cardiomegaly.   Non-Vascular:   1. No acute intrathoracic abnormality. 2. Cholelithiasis.   Ruthann Cancer, MD    EKG:  EKG EKG today after noting irregular heart rhythm reveals A-fib with moderate rate control, left ventricular hypertrophy.  This is new compared to the most recent prior tracing.   Recent Labs: 11/13/2021: Magnesium 2.5; TSH 1.934 04/16/2022: ALT 12 05/19/2022: Hemoglobin 10.9; Platelets 203 07/26/2022: BUN 16; Creatinine, Ser 1.32; Potassium 3.0; Sodium 145  Recent Lipid Panel    Component Value Date/Time   CHOL 134 04/20/2016 1409   TRIG 99 04/20/2016 1409   HDL 54 04/20/2016 1409   CHOLHDL 2.5 04/20/2016 1409   VLDL 20 04/20/2016 1409   LDLCALC 60 04/20/2016 1409    Physical Exam:    VS:  BP 112/62   Pulse (!) 52   Ht 5' 7" (1.702 m)   Wt 168 lb 12.8 oz (76.6 kg)   SpO2 97%   BMI 26.44 kg/m     Wt  Readings from Last 3 Encounters:  08/04/22 168 lb 12.8 oz (76.6 kg)  05/17/22 169 lb (76.7 kg)  05/11/22 169 lb 6.4 oz (76.8 kg)     GEN: As Muslim garb. No acute distress HEENT: Normal NECK: No JVD. LYMPHATICS: No lymphadenopathy CARDIAC: 2/6 to 3/6 decrescendo diastolic murmur.  Irregularly irregular RR no gallop, or edema. VASCULAR:  Normal Pulses. No bruits. RESPIRATORY:  Clear to auscultation without rales, wheezing or rhonchi  ABDOMEN: Soft, non-tender, non-distended, No pulsatile mass, MUSCULOSKELETAL: No deformity  SKIN: Warm and dry NEUROLOGIC:  Alert and oriented x 3 PSYCHIATRIC:  Normal affect   ASSESSMENT:    1. Paroxysmal atrial fibrillation (HCC)   2. Nonrheumatic aortic valve insufficiency   3. Aortic aneurysm without rupture, unspecified portion of aorta ([place])   4. Primary hypertension   5. Other hyperlipidemia   6. Morbid obesity ([place])    PLAN:    In order of problems listed above:  This is a new finding.  In reviewing multiple prior EKGs she did have an EKG documentation of atrial fibrillation in December 2022.  Her rate control is reasonable on metoprolol.  We will plan a 2-week monitor.  We will make further decision about management strategy depending upon whether the A-fib is continuous or intermittent.  Given her underlying structural heart disease, rhythm control will be  important to achieve.  Start Eliquis 5 mg twice daily. Aortic regurgitation mild to moderate.  We will need to make adjustments in medications. Needs close clinical follow-up. Multiple medications are being used to control blood pressure, see medication list. Continue statin therapy Encourage weight loss   The natural history of atrial fibrillation was discussed.  The inability to cure and the possibility of recurrences was clearly stated.  Management strategies including rhythm control with antiarrhythmic therapy and/or ablation,  rate control (beta-blocker therapy, digoxin, or AV node  blocking CCB therapy), and  occasional need for pacemaker therapy were reviewed.  Stroke risk (as determined by CHADS VASC score >1). The requirement for and the duration/permanence of anti-coagulation therapy will be determined by context of individual risk factors and burden of AF.  Anticoagulation risks (vitamin K antagonists v DOAC therapy) were reviewed, highlighting the lower bleeding ris_0 @ k and improved safety with DOAC's. Shared decision making was stressed.     CHA2DS2-VASc Score = 7 {The patient's score is based upon: CHF History: 1 HTN History: 1 Stroke History: 2 Vascular Disease History: 1 Age Score: 1 Gender Score: 1        Medication Adjustments/Labs and Tests Ordered: Current medicines are reviewed at length with the patient today.  Concerns regarding medicines are outlined above.  Orders Placed This Encounter  Procedures   LONG TERM MONITOR (3-14 DAYS)   EKG 12-Lead   Meds ordered this encounter  Medications   apixaban (ELIQUIS) 5 MG TABS tablet    Sig: Take 1 tablet (5 mg total) by mouth 2 (two) times daily.    Dispense:  60 tablet    Refill:  11    Patient Instructions  Medication Instructions:  Your physician has recommended you make the following change in your medication:   1) STOP aspirin 2) STOP Goodys powder 3) START apixaban (Eliquis) 26m twice daily  *If you need a refill on your cardiac medications before your next appointment, please call your pharmacy*  Lab Work: NONE  Testing/Procedures: Your physician has requested that you wear a Zio heart monitor for 2 weeks (14 days). This will be mailed to your home with instructions on how to apply the monitor and how to send it back in when finished.  Follow-Up: [facility].  As part of our continuing mission to provide you with exceptional heart care, we have created designated Provider Care Teams.  These Care Teams include  your primary Cardiologist (physician) and Advanced Practice Providers (APPs -  Physician Assistants and Nurse Practitioners) who all work together to provide you with the care you need, when you need it.  Your next appointment:   4-6 week(s)  The format for your next appointment:   In Person  Provider:   HSinclair Grooms MD    Other Instructions ZPerleyMonitor Instructions  Your physician has requested you wear a ZIO patch monitor for 14 days.  This is a single patch monitor. [employer] supplies one patch monitor per enrollment. Additional stickers are not available. Please do not apply patch if you will be having a Nuclear Stress Test,  Echocardiogram, Cardiac CT, MRI, or Chest Xray during the period you would be wearing the  monitor. The patch cannot be worn during these tests. You cannot remove and re-apply the  ZIO XT patch monitor.  Your ZIO patch monitor will be mailed 3 day USPS to your address on file. It  may take 3-5 days  to receive your monitor after you have been enrolled.  Once you have received your monitor, please review the enclosed instructions. Your monitor  has already been registered assigning a specific monitor serial # to you.  Billing and Patient Assistance Program Information  We have supplied [employer] with any of your insurance information on file for billing purposes. [employer] offers a sliding scale Patient Assistance Program for patients that do not have  insurance, or whose insurance does not completely cover the cost of the ZIO monitor.  You must apply for the Patient Assistance Program to qualify for this discounted rate.  To apply, please call [employer] at [phone], select option 4, select option 2, ask to apply for  Patient Assistance Program. Theodore Demark will ask your household income, and how many people  are in your household. They will quote your out-of-pocket cost based on that information.  [employer] will also be able to set up a 27-month  interest-free payment plan if needed.  Applying the monitor   Shave hair from upper left chest.  Hold abrader disc by orange tab. Rub abrader in 40 strokes over the upper left chest as  indicated in your monitor instructions.  Clean area with 4 enclosed alcohol pads. Let dry.  Apply patch as indicated in monitor instructions. Patch will be placed under collarbone on left  side of chest with arrow pointing upward.  Rub patch adhesive wings for 2 minutes. Remove white label marked "1". Remove the white  label marked "2". Rub patch adhesive wings for 2 additional minutes.  While looking in a mirror, press and release button in center of patch. A small green light will  flash 3-4 times. This will be your only indicator that the monitor has been turned on.  Do not shower for the first 24 hours. You may shower after the first 24 hours.  Press the button if you feel a symptom. You will hear a small click. Record Date, Time and  Symptom in the Patient Logbook.  When you are ready to remove the patch, follow instructions on the last 2 pages of Patient  Logbook. Stick patch monitor onto the last page of Patient Logbook.  Place Patient Logbook in the blue and white box. Use locking tab on box and tape box closed  securely. The blue and white box has prepaid postage on it. Please place it in the mailbox as  soon as possible. Your physician should have your test results approximately 7 days after the  monitor has been mailed [facility] to [facility]  Call [place]at [phone] you have questions regarding  your ZIO XT patch monitor. Call them immediately if you see an orange light blinking on your  monitor.  If your monitor falls off in less than 4 days, contact our Monitor department at [phone]  If your monitor becomes loose or falls off after 4 days call [employer] at [phone]  suggestions on securing your monitor   Important Information About  [place]         Signed, HSinclair Grooms MD  08/04/2022 4:14 PM    [facility]

## 2022-08-04 NOTE — Progress Notes (Unsigned)
Enrolled for [employer] to mail a ZIO XT long term holter monitor to the patients address on file.  

## 2022-08-06 DIAGNOSIS — I480 Paroxysmal atrial fibrillation: Secondary | ICD-10-CM | POA: Diagnosis not present

## 2022-08-09 ENCOUNTER — Telehealth: Payer: Self-pay | Admitting: Interventional Cardiology

## 2022-08-09 NOTE — Telephone Encounter (Signed)
Patient called stating she has a question for RN Drue Dun, Dr. Thompson Caul nurse.  Patient is following up on her previous question.

## 2022-08-09 NOTE — Telephone Encounter (Signed)
  Per MyChart scheduling message:  Hey Dr Tamala Julian this is Chelsea [name] I meant to ask you when I was there Wednesday would it be OK if I was to live alone with all that's going on with my heart and all my daughter feels it is not safe for me to live alone

## 2022-08-09 NOTE — Telephone Encounter (Signed)
Returned call to patient.  She states she would like to know what Dr. Thompson Caul opinion/recommendation is on her living alone. She states her daughter worries about her being by herself in case something happens and she doesn't have anyone around to help her. Patient states "I'm a grown woman and can take care of myself, but my daughter says I don't need to be staying by myself. I just want to know what Dr. Tamala Julian thinks, if he feels it is OK for me to live on my own."  Will forward to Dr. Tamala Julian to review and advise.

## 2022-08-11 ENCOUNTER — Ambulatory Visit (INDEPENDENT_AMBULATORY_CARE_PROVIDER_SITE_OTHER): Payer: Medicare Other | Admitting: Adult Health

## 2022-08-11 ENCOUNTER — Encounter: Payer: Self-pay | Admitting: Adult Health

## 2022-08-11 VITALS — BP 159/94 | HR 79 | Ht 66.0 in | Wt 169.0 lb

## 2022-08-11 DIAGNOSIS — G890 Central pain syndrome: Secondary | ICD-10-CM

## 2022-08-11 DIAGNOSIS — I69354 Hemiplegia and hemiparesis following cerebral infarction affecting left non-dominant side: Secondary | ICD-10-CM

## 2022-08-11 NOTE — Patient Instructions (Signed)
Your Plan:  Continue gabapentin 800 mg BID If your symptoms worsen or you develop new symptoms please let us know.       Thank you for coming to see [place] at [facility]. I hope we have been able to provide you high quality care today.  You may receive a patient satisfaction survey over the next few weeks. We would appreciate your feedback and comments so that we may continue to improve ourselves and the health of our patients.

## 2022-08-11 NOTE — Progress Notes (Signed)
PATIENT: Chelsea [name] DOB: May 20, 1951  REASON FOR VISIT: follow up HISTORY FROM: patient PRIMARY NEUROLOGIST: Dr. Rexene Alberts  Chief Complaint  Patient presents with   Follow-up    Pt in 97 Pt here for CVA f/u Pt states  intestine inflammation and had a colostomy bag . Pt states colostomy bag was removed beginning of year .  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  She returns today for follow-up.  Reports that Pain varies day to day. Continues gabapentin 800 mg BID. Still using rollator. No falls.   Since last visit had an intestine inflammation and had a colostomy bag but that has since been removed.  Reports that they also found an aneurysm on her aorta that they are monitoring for now.  She reports that her family is insisting that she live somewhere with more visibility on her.  They do not like her living alone.  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  She returns today for follow-up.  She reports that her symptoms have remained stable.  Continues to have weakness on the left side.  Reports that gabapentin 800 mg twice a day controls her discomfort.  Reports that she did complete physical therapy.  She states that she had 2 falls in July but fortunately did not suffer any injuries.  She does use a Rollator when ambulating.  She returns today for an evaluation.  HISTORY  08/11/2020: She reports that her weakness has been stable on the left side since 2007.  She does report that when she is stressed out or upset, she feels that her numbness gets worse on the left side.  She continues to take gabapentin 800 mg twice daily for discomfort on the left side.  She has not been using her ankle brace on the left, she may have misplaced it but will look for it.  She is trying to quit smoking but continues to smoke  approximately a pack per day currently.  She has seen cardiology and has been started on metoprolol.  She was told she had a aortic aneurysm that needs to be monitored.  She may need surgery eventually for this.  She saw Dr. Tamala Julian in cardiology on 08/08/2020 and I reviewed the note.    She had a brain MRI w/wo contrast on 11/19/08 and I reviewed the results:  IMPRESSION:  1.  No evidence of acute ischemia.  2.  Stable non specific supratentorial subcortical white matter  changes most likely representing areas of ischemic gliosis due to  small vessel disease related to hypertension or diabetes.  3.  Interval near-complete resolution of the previous right  thalamic hemorrhage.  4.  Small focal area of enhancement within the epicenter of the  hemorrhage, which probably represents contrast pooling.  No  evidence of a vascular abnormality however is suggested on the MRI  examination.   She had a brain MRI with and without contrast, MRA head without contrast and MRA neck with and without contrast through [facility] on 10/03/2009 and I reviewed the results:   Conclusion:  Linear area of postcontrast enhancement in the right thalamus more  likely represents granulation tissue from prior hemorrhage than a  cavernoma given its appearance. The peripheral area of low T2 signal  intensity is most consistent with hemosiderin deposition  of remote  blood products.  If outside MRI is available for comparison this  would be helpful to more accurately characterize the chronicity of  this process.   Slight decrease perfusion in the left watershed supratentorial brain  without stenosis identified in the arterial circulation of the neck  or brain. This may be secondary to asymmetric artifact in the study  technique.   Scattered foci of T2 signal hyperintensity in the supratentorial  white matter greater on the right than left which are nonspecific but  most likely related to chronic small vessel ischemic  disease.   No acute infarct.   Unremarkable MRA of the intracranial and cervical vessels.      The patient's allergies, current medications, family history, past medical history, past social history, past surgical history and problem list were reviewed and updated as appropriate.     REVIEW OF SYSTEMS: Out of a complete 14 system review of symptoms, the patient complains only of the following symptoms, and all other reviewed systems are negative.  ALLERGIES: Allergies  Allergen Reactions   Other Swelling    Hair dye, caused eye swelling, multiple times experienced this reaction   Hydrocodone Nausea And Vomiting   Oxycodone Nausea And Vomiting    Other reaction(s): Unknown    HOME MEDICATIONS: Outpatient Medications Prior to Visit  Medication Sig Dispense Refill   amLODipine (NORVASC) 10 MG tablet TAKE 1 TABLET (10 MG TOTAL) BY MOUTH DAILY FOR BLOOD PRESSURE (Patient taking differently: Take 10 mg by mouth daily as needed (elevated bp 180/110).) 90 tablet 3   apixaban (ELIQUIS) 5 MG TABS tablet Take 1 tablet (5 mg total) by mouth 2 (two) times daily. 60 tablet 11   Ascorbic Acid (VITAMIN C) 1000 MG tablet Take 1,000 mg by mouth in the morning.     atorvastatin (LIPITOR) 10 MG tablet TAKE 1 TABLET BY MOUTH  DAILY (Patient taking differently: Take 10 mg by mouth every evening.) 90 tablet 3   b complex vitamins capsule Take 1 capsule by mouth in the morning.     B Complex-C (B-COMPLEX WITH VITAMIN C) tablet Take 1 tablet by mouth in the morning.     Biotin 5000 MCG TABS Take 5,000 mcg by mouth in the morning.     chlorhexidine (PERIDEX) 0.12 % solution Use as directed 15 mLs in the mouth or throat daily.     cholecalciferol (VITAMIN D) 25 MCG (1000 UNIT) tablet Take 1,000 Units by mouth daily as needed (with vertigo).     citalopram (CELEXA) 20 MG tablet Take 20 mg by mouth in the morning and at bedtime.     cloNIDine (CATAPRES) 0.3 MG tablet Take 0.3 mg by mouth 2 (two) times daily.      doxazosin (CARDURA) 8 MG tablet Take 8 mg by mouth daily.     ezetimibe (ZETIA) 10 MG tablet Take 10 mg by mouth 2 (two) times a week.     Ferrous Gluconate (IRON 27 PO) Take by mouth in the morning.     gabapentin (NEURONTIN) 800 MG tablet Take 1 tablet (800 mg total) by mouth 2 (two) times daily. (Patient taking differently: Take 400 mg by mouth 2 (two) times daily as needed (nerve pain.).) 180 tablet 3   hydrALAZINE (APRESOLINE) 25 MG tablet Take 1 tablet (25 mg total) by mouth 3 (three) times daily. 270 tablet 3   indapamide (LOZOL) 2.5 MG tablet Take 1 tablet (2.5 mg total) by mouth daily. 30 tablet 11   losartan (  COZAAR) 50 MG tablet Take 1 tablet (50 mg total) by mouth daily. 90 tablet 3   meclizine (ANTIVERT) 25 MG tablet Take 25 mg by mouth 3 (three) times daily as needed (vertigo).     methocarbamol (ROBAXIN) 750 MG tablet Take 1 tablet (750 mg total) by mouth 3 (three) times daily. (Patient taking differently: Take 750 mg by mouth every 8 (eight) hours as needed for muscle spasms.) 60 tablet 0   metoprolol succinate (TOPROL-XL) 100 MG 24 hr tablet Take 1 tablet (100 mg total) by mouth daily. Take with or immediately following a meal. 15 tablet 0   mirabegron ER (MYRBETRIQ) 25 MG TB24 tablet Take 25 mg by mouth in the morning.     Multiple Vitamin (MULTIVITAMIN WITH MINERALS) TABS tablet Take 1 tablet by mouth in the morning.     nicotine (NICODERM CQ - DOSED IN MG/24 HOURS) 21 mg/24hr patch PLACE 1 PATCH (21 MG TOTAL) ONTO THE SKIN DAILY. 28 patch 0   ondansetron (ZOFRAN) 8 MG tablet Take 8 mg by mouth 3 (three) times daily as needed for vomiting or nausea.     pantoprazole (PROTONIX) 40 MG tablet Take 40 mg by mouth daily before breakfast.     potassium chloride SA (KLOR-CON M) 20 MEQ tablet Take 1 tablet (20 mEq total) by mouth daily. 90 tablet 3   spironolactone (ALDACTONE) 25 MG tablet TAKE 1 TABLET (25 MG TOTAL) BY MOUTH DAILY. 90 tablet 2   traMADol (ULTRAM) 50 MG tablet Take 1  tablet (50 mg total) by mouth every 6 (six) hours as needed for severe pain. 25 tablet 1   Vitamin A 2400 MCG (8000 UT) CAPS Take 2,400 mcg by mouth in the morning.     vitamin B-12 (CYANOCOBALAMIN) 1000 MCG tablet Take 1,000 mcg by mouth in the morning.     vitamin E 180 MG (400 UNITS) capsule Take 400 Units by mouth in the morning.     No facility-administered medications prior to visit.    PAST MEDICAL HISTORY: Past Medical History:  Diagnosis Date   Anxiety    Aortic aneurysm without rupture [facility])    followed by dr dr h. Tamala Julian,  per echo 08/ 2021 88m and chest CT 03/ 2021 4.0.cm   Arthritis    lower back   BPPV (benign paroxysmal positional vertigo)    Chronic back pain    CKD (chronic kidney disease), stage II    Depression    GERD (gastroesophageal reflux disease)    Headache    Hemiparesis affecting left side as late effect of [place]vascular [place]    Hiatal hernia    History of anemia    History of cerebrovascular accident (CVA) with residual deficit 03/2006   (neurologist--- dr aRexene Alberts admission in epic, acute right thalamic hypertensive hemorrhage w/ left sided hemiparesis   History of gastric ulcer 2003   w/ upper gi bleed,  egd with no intervention needed   Hyperlipidemia    Hypertension    followed by cardiology--- [facility]. [facility]   (nuclear stress test 01-31-20121 normal w/ ef 54%; cardaic cath 08-07-2010 no sig coronary obstruction, preserved LVF, mild AR   Multinodular goiter    followed by pcp,  last ultrasound in epic 03-20-2020 , never met critiria for bx   Nonrheumatic aortic (valve) insufficiency    followed by cardiologist--- dr h. sTamala Julian  last echo in epic 08/ 2021 ef 60-65%, mild AR with ava 1.81cm^2 and mean gradient 11.09mg  OAB (overactive bladder)    urologist--- dr Matilde Sprang   Pneumonia    as a child   Spondylolisthesis, lumbar region    Stroke [facility])    left side weakness 2007   Subcutaneous mass of back    upper back    Thalamic pain syndrome    secondary to acute right thalamic hypertensive hemorrhage in 2007,  followed by dr Rexene Alberts   Urge incontinence    Wears glasses    Wears hearing aid in both ears    Wears partial dentures    upper and lower    PAST SURGICAL HISTORY: Past Surgical History:  Procedure Laterality Date   ABDOMINAL HYSTERECTOMY     CARDIAC CATHETERIZATION  08/07/2010   @ [place]  dr Lia Foyer---  no sig coronary obstruction, preserved LVF, mild AR   COLON RESECTION SIGMOID N/A 11/08/2021   Procedure: COLON RESECTION SIGMOID; DRAINAGE OF INTRAABDOMINAL ABSCESS;  Surgeon: Georganna Skeans, MD;  Location: [place];  Service: General;  Laterality: N/A;   COLOSTOMY N/A 11/08/2021   Procedure: COLOSTOMY;  Surgeon: Georganna Skeans, MD;  Location: [place];  Service: General;  Laterality: N/A;   COLOSTOMY REVERSAL N/A 05/17/2022   Procedure: COLOSTOMY REVERSAL ([facility]);  Surgeon: Georganna Skeans, MD;  Location: [place];  Service: General;  Laterality: N/A;   MASS EXCISION N/A 06/04/2021   Procedure: EXCISION SUCUTANEOUS MASS X2, UPPER BACK;  Surgeon: Clovis Riley, MD;  Location: [place];  Service: General;  Laterality: N/A;   TOTAL KNEE ARTHROPLASTY Left 01/05/2016   Procedure: TOTAL KNEE ARTHROPLASTY;  Surgeon: Frederik Pear, MD;  Location: [place];  Service: Orthopedics;  Laterality: Left;   TUBAL LIGATION Bilateral 2000    FAMILY HISTORY: Family History  Problem Relation Age of Onset   Hypertension Mother        deceased   Peripheral vascular disease Mother    Cirrhosis Father        DeCEASED   Heart attack Other        grandmother deceased   Cancer Brother    Drug abuse Brother        overdose, in 1 brother   Breast cancer Neg Hx     SOCIAL HISTORY: Social History   Socioeconomic History   Marital status: Divorced    Spouse name: Not on file   Number of children: 3   Years of education: 11   Highest education level: Not on file  Occupational History   Occupation: child care     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  Tobacco Use   Smoking status: Former    Packs/day: 1.00    Years: 50.00    Total pack years: 50.00    Types: Cigarettes    Quit date: 10/29/2021    Years since quitting: 0.7   Smokeless tobacco: Never  Vaping Use   Vaping Use: Never used  Substance and Sexual Activity   Alcohol use: No    Alcohol/week: 0.0 standard drinks of alcohol   Drug use: No   Sexual activity: Never  Other Topics Concern   Not on file  Social History Narrative   Patient consumes no caffeine   Social Determinants of Health   Financial Resource Strain: Not on file  Food Insecurity: Not on file  Transportation Needs: Not on file  Physical Activity: Not on file  Stress: Not on file  Social Connections: Not on file  Intimate Partner Violence: Not on file  PHYSICAL EXAM  Vitals:   08/11/22 1112  BP: (!) 159/94  Pulse: 79  Weight: 169 lb (76.7 kg)  Height: 5' 6"  (1.676 m)    Body mass index is 27.28 kg/m.  Generalized: Well developed, in no acute distress   Neurological examination  Mentation: Alert oriented to time, place, history taking. Follows all commands speech and language fluent Cranial nerve II-XII: Pupils were equal round reactive to light. Extraocular movements were full, visual field were full on confrontational test. Facial sensation and strength were normal.  Head turning and shoulder shrug  were normal and symmetric. Motor: The motor testing reveals 5 over 5 strength of all 4 extremities slightly weaker in the LUE. Good symmetric motor tone is noted throughout.  Sensory: Sensory testing is intact to soft touch on all 4 extremities. No evidence of extinction is noted.  Coordination: Cerebellar testing reveals good finger-nose-finger on the right difficulty on the left. Good heel-to-shin bilaterally.  Gait and station: Gait is normal.  Reflexes: Deep tendon reflexes are symmetric and normal bilaterally.    DIAGNOSTIC DATA (LABS, IMAGING, TESTING) - I reviewed patient records, labs, notes, testing and imaging myself where available.  Lab Results  Component Value Date   WBC 8.0 05/19/2022   HGB 10.9 (L) 05/19/2022   HCT 34.6 (L) 05/19/2022   MCV 91.5 05/19/2022   PLT 203 05/19/2022      Component Value Date/Time   NA 145 (H) 07/26/2022 1218   K 3.0 (L) 07/26/2022 1218   CL 99 07/26/2022 1218   CO2 30 (H) 07/26/2022 1218   GLUCOSE 75 07/26/2022 1218   GLUCOSE 102 (H) 05/19/2022 0117   BUN 16 07/26/2022 1218   CREATININE 1.32 (H) 07/26/2022 1218   CREATININE 1.11 (H) 04/20/2016 1409   CALCIUM 10.1 07/26/2022 1218   PROT 7.3 04/16/2022 2305   PROT 6.8 08/11/2020 1058   ALBUMIN 3.5 04/16/2022 2305   ALBUMIN 3.8 08/11/2020 1058   AST 18 04/16/2022 2305   ALT 12 04/16/2022 2305   ALKPHOS 88 04/16/2022 2305   BILITOT 0.4 04/16/2022 2305   BILITOT 0.4 08/11/2020 1058   GFRNONAA 59 (L) 05/19/2022 0117   GFRAA 42 (L) 10/22/2020 1352   Lab Results  Component Value Date   CHOL 134 04/20/2016   HDL 54 04/20/2016   LDLCALC 60 04/20/2016   TRIG 99 04/20/2016   CHOLHDL 2.5 04/20/2016   Lab Results  Component Value Date   HGBA1C 6.0 (H) 11/08/2021   Lab Results  Component Value Date   VITAMINB12 2,225 (H) 02/12/2022   Lab Results  Component Value Date   TSH 1.420  12/05/2009      ASSESSMENT AND PLAN 71 y.o. year old female  has a past medical history of Anxiety, Aortic aneurysm without rupture ([place]), Arthritis, BPPV (benign paroxysmal positional vertigo), Chronic back pain, CKD (chronic kidney disease), stage II, Depression, GERD (gastroesophageal reflux disease), Headache, Hemiparesis affecting left side as late effect of cerebrovascular accident ([place]), Hiatal hernia, History of anemia, History of cerebrovascular accident (CVA) with residual deficit (03/2006), History of gastric ulcer (2003), Hyperlipidemia, Hypertension, Multinodular goiter, Nonrheumatic aortic (valve)  insufficiency, OAB (overactive bladder), Pneumonia, Spondylolisthesis, lumbar region, Stroke ([place]), Subcutaneous mass of back, Thalamic pain syndrome, Urge incontinence, Wears glasses, Wears hearing aid in both ears, and Wears partial dentures. here with:  1.  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  Abnormality of gait and balance  --Continue gabapentin 800 mg twice a day --Encouraged the patient to use Rollator at  all times. --Advised if she begins to have frequent falls she should let us know --Follow-up in 1 year or sooner if needed     Ward Givens, MSN, NP-C 08/11/2022, 11:04 AM [facility] [address], [place], [place] [address] [phone]

## 2022-08-12 NOTE — Telephone Encounter (Signed)
Spoke with patient and shared with her Dr. Thompson Caul reply to her question re: living alone.  Belva Crome, MD    It is okay for now because no acute problems are occurring.  We will have to play it by ear though if things progress.    Patient verbalized understanding and expressed appreciation for follow-up.

## 2022-08-13 ENCOUNTER — Telehealth: Payer: Self-pay | Admitting: Interventional Cardiology

## 2022-08-13 NOTE — Telephone Encounter (Signed)
Returned call to patient. She states she has a provider portion of application for Eliquis needing to be filled out by Dr. Tamala Julian but she does not see him until October.  Informed patient she can fill out her portion of the application and provide the requested supporting documents, bring them by the office and we will have Dr. Tamala Julian sign his portion and fax application to [facility].  Patient verbalized understanding and states she will try to get application to our office early next week.

## 2022-08-13 NOTE — Telephone Encounter (Signed)
Pt would like a callback from nurse regarding paperwork that needs to be filled out for [facility]. Please advise

## 2022-08-17 ENCOUNTER — Telehealth: Payer: Self-pay

## 2022-08-17 NOTE — Telephone Encounter (Signed)
**Note De-Identified Chelsea [name] Obfuscation** The pts completed [employer] application for Eliquis assistance was left at the office with copies of her ins cards.  I have completed the providers page of her application and have e-mailed all to Dr Thompson Caul nurse so she can obtain his signature, date it, and to then fax all to [facility] at the fax number written on the cover letter included.

## 2022-08-17 NOTE — Telephone Encounter (Signed)
Patient assistance application signed and faxed. 

## 2022-08-23 NOTE — Telephone Encounter (Signed)
[employer] has denied the pt asst with Eliquis at this time. Reason: Documentation of 3% out of pocket RX expenses, based on household adjusted gross income, not met. [id number]  The letter states that they have notified the pt of this denial as well. 

## 2022-08-25 ENCOUNTER — Inpatient Hospital Stay: Payer: Medicare Other

## 2022-08-25 ENCOUNTER — Ambulatory Visit: Payer: Medicare Other | Admitting: Hematology and Oncology

## 2022-08-25 ENCOUNTER — Ambulatory Visit: Payer: Medicare Other | Admitting: Surgery

## 2022-08-25 ENCOUNTER — Other Ambulatory Visit: Payer: Medicare Other

## 2022-08-26 ENCOUNTER — Telehealth: Payer: Self-pay | Admitting: Interventional Cardiology

## 2022-08-26 DIAGNOSIS — I480 Paroxysmal atrial fibrillation: Secondary | ICD-10-CM | POA: Diagnosis not present

## 2022-08-26 NOTE — Telephone Encounter (Signed)
Received call from [facility] with [employer] with Zio results.  Minette Brine reports occurrence of rapid A-fib with HR of 198 bpm for 60 seconds. Referencing page 19 and 20 on strip 11 in report.     Cardiac Monitor Alert  Date of alert:  08/26/2022   Patient Name: Chelsea [name]  DOB: 06/11/1951  MRN: [id number]   [place] Cardiologist: Sinclair Grooms, MD  [facility] EP:  None    Monitor Information: Long Term Monitor [ZioXT]  Reason:  Rapid Atrial Fibrillation Ordering provider:  Dr. Daneen Schick :1}  Alert Atrial Fibrillation/Flutter This is the 1st alert for this rhythm.  The patient has a hx of Atrial Fibrillation/Flutter.   :1}  Anticoagulation medication as of 08/26/2022           apixaban (ELIQUIS) 5 MG TABS tablet Take 1 tablet (5 mg total) by mouth 2 (two) times daily.       Next Cardiology Appointment   Date:  08/30/2022  Provider:  Dr. Daneen Schick  The patient was contacted today.  She is asymptomatic. Arrhythmia, symptoms and history reviewed with DOD Dr. Sherren Mocha.  Plan:  continue anticoagulation. Dr. Tamala Julian to review monitor results and discuss with patient at appointment on 08/30/22. Patient notified and verbalized understanding, confirmed appointment for 08/30/22 with Dr. Tamala Julian.    Other: Patient states she has been feeling more stressed trying to find a place to live that is suitable for her, but other than that no complaints.  Molli Barrows, RN  08/26/2022 1:40 PM

## 2022-08-26 NOTE — Telephone Encounter (Signed)
[place]livia with [employer] calling with critical zio results

## 2022-08-29 NOTE — Progress Notes (Unsigned)
Cardiology Office Note:    Date:  08/30/2022   ID:  Chelsea [name], DOB 1951-11-28, MRN [id number]  PCP:  Trey Sailors, PA  Cardiologist:  Sinclair Grooms, MD   Referring MD: Trey Sailors, [place]   Chief Complaint  Patient presents with   Coronary Artery Disease   Hypertension   Cardiac Valve Problem    History of Present Illness:    Chelsea [name] is a 71 y.o. female with a hx of  mild aortic regurgitation, non-cardiac chest pain, prior history of stroke with residual left-sided weakness, hyperlipidemia, hypertension, and moderate obesity   She has moderate aortic regurgitation.  She has exertional fatigue.  Some of this may be related to overtreatment of her blood pressure.  Today for systolic blood pressure is 031 mmHg systolic.  The most recent echo demonstrated moderate aortic regurgitation with low normal LV systolic function.  I am somewhat concerned by the low normal systolic function but the LV is not dilated.  I doubt aortic regurgitation is contributing to the exertional fatigue.  She has atrial fibrillation with a 14% burden.  When in A-fib heart rate can be greater than 115 bpm.  She also had wide-complex tachycardia at rates greater than 180 bpm which could represent aberration while in A-fib.  She does not have known coronary artery disease.  Past Medical History:  Diagnosis Date   Anxiety    Aortic aneurysm without rupture [facility])    followed by dr dr h. 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 [facility]   (nuclear stress test 01-31-20121 normal w/ ef 54%; cardaic cath 08-07-2010 no sig coronary obstruction, preserved LVF, mild AR   Multinodular goiter    followed by pcp,  last ultrasound in epic 03-20-2020 , never met critiria for bx   Nonrheumatic aortic (valve) insufficiency    followed by cardiologist--- dr h. 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,  followed by dr atRexene Alberts Urge incontinence    Wears glasses    Wears hearing aid in both ears    Wears partial dentures    upper and lower    Past Surgical History:  Procedure Laterality Date   ABDOMINAL HYSTERECTOMY  CARDIAC CATHETERIZATION  08/07/2010   @ [place]  dr Lia Foyer---  no sig coronary obstruction, preserved LVF, mild AR   COLON RESECTION SIGMOID N/A 11/08/2021   Procedure: COLON RESECTION SIGMOID; DRAINAGE OF INTRAABDOMINAL ABSCESS;  Surgeon: Georganna Skeans, MD;  Location: [place];  Service: General;  Laterality: N/A;   COLOSTOMY N/A 11/08/2021   Procedure: COLOSTOMY;  Surgeon: Georganna Skeans, MD;  Location: [place];  Service: General;  Laterality: N/A;   COLOSTOMY REVERSAL N/A 05/17/2022   Procedure: COLOSTOMY REVERSAL ([facility]);  Surgeon: Georganna Skeans, MD;  Location: [place];  Service: General;  Laterality: N/A;   MASS EXCISION N/A 06/04/2021   Procedure: EXCISION SUCUTANEOUS MASS X2, UPPER BACK;  Surgeon:  Clovis Riley, MD;  Location: [place];  Service: General;  Laterality: N/A;   TOTAL KNEE ARTHROPLASTY Left 01/05/2016   Procedure: TOTAL KNEE ARTHROPLASTY;  Surgeon: Frederik Pear, MD;  Location: [place];  Service: Orthopedics;  Laterality: Left;   TUBAL LIGATION Bilateral 2000    Current Medications: Current Meds  Medication Sig   amLODipine (NORVASC) 10 MG tablet TAKE 1 TABLET (10 MG TOTAL) BY MOUTH DAILY FOR BLOOD PRESSURE (Patient taking differently: Take 10 mg by mouth daily as needed (elevated bp 180/110).)   apixaban (ELIQUIS) 5 MG TABS tablet Take 1 tablet (5 mg total) by mouth 2 (two) times daily.   Ascorbic Acid (VITAMIN C) 1000 MG tablet Take 1,000 mg by mouth in the morning.   atorvastatin (LIPITOR) 10 MG tablet TAKE 1 TABLET BY MOUTH  DAILY (Patient taking differently: Take 10 mg by mouth every evening.)   b complex vitamins capsule Take 1 capsule by mouth in the morning.   B Complex-C (B-COMPLEX WITH VITAMIN C) tablet Take 1 tablet by mouth in the morning.   Biotin 5000 MCG TABS Take 5,000 mcg by mouth in the morning.   chlorhexidine (PERIDEX) 0.12 % solution Use as directed 15 mLs in the mouth or throat daily.   cholecalciferol (VITAMIN D) 25 MCG (1000 UNIT) tablet Take 1,000 Units by mouth daily as needed (with vertigo).   citalopram (CELEXA) 20 MG tablet Take 20 mg by mouth in the morning and at bedtime.   cloNIDine (CATAPRES) 0.3 MG tablet Take 0.3 mg by mouth 2 (two) times daily.   ezetimibe (ZETIA) 10 MG tablet Take 10 mg by mouth 2 (two) times a week.   Ferrous Gluconate (IRON 27 PO) Take by mouth in the morning.   gabapentin (NEURONTIN) 800 MG tablet Take 1 tablet (800 mg total) by mouth 2 (two) times daily. (Patient taking differently: Take 400 mg by mouth 2 (two) times daily as needed (nerve pain.).)   hydrALAZINE (APRESOLINE) 25 MG tablet Take 1 tablet (25 mg total) by mouth 3 (three) times daily.   losartan (COZAAR) 50 MG tablet Take 1 tablet (50 mg total) by mouth  daily.   meclizine (ANTIVERT) 25 MG tablet Take 25 mg by mouth 3 (three) times daily as needed (vertigo).   methocarbamol (ROBAXIN) 750 MG tablet Take 1 tablet (750 mg total) by mouth 3 (three) times daily. (Patient taking differently: Take 750 mg by mouth every 8 (eight) hours as needed for muscle spasms.)   metoprolol succinate (TOPROL-XL) 100 MG 24 hr tablet Take 1 tablet (100 mg total) by mouth daily. Take with or immediately following a meal.   mirabegron ER (MYRBETRIQ) 25 MG TB24 tablet Take 25 mg by mouth in the morning.   Multiple Vitamin (MULTIVITAMIN WITH MINERALS) TABS tablet Take 1  tablet by mouth in the morning.   nicotine (NICODERM CQ - DOSED IN MG/24 HOURS) 21 mg/24hr patch PLACE 1 PATCH (21 MG TOTAL) ONTO THE SKIN DAILY.   pantoprazole (PROTONIX) 40 MG tablet Take 40 mg by mouth daily before breakfast.   potassium chloride SA (KLOR-CON M) 20 MEQ tablet Take 1 tablet (20 mEq total) by mouth daily.   spironolactone (ALDACTONE) 25 MG tablet TAKE 1 TABLET (25 MG TOTAL) BY MOUTH DAILY.   traMADol (ULTRAM) 50 MG tablet Take 1 tablet (50 mg total) by mouth every 6 (six) hours as needed for severe pain.   Vitamin A 2400 MCG (8000 UT) CAPS Take 2,400 mcg by mouth in the morning.   vitamin B-12 (CYANOCOBALAMIN) 1000 MCG tablet Take 1,000 mcg by mouth in the morning.   vitamin E 180 MG (400 UNITS) capsule Take 400 Units by mouth in the morning.   [DISCONTINUED] doxazosin (CARDURA) 8 MG tablet Take 8 mg by mouth daily.   [DISCONTINUED] indapamide (LOZOL) 2.5 MG tablet Take 1 tablet (2.5 mg total) by mouth daily.   [DISCONTINUED] ondansetron (ZOFRAN) 8 MG tablet Take 8 mg by mouth 3 (three) times daily as needed for vomiting or nausea.     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  Tobacco Use   Smoking status: Former    Packs/day: 1.00    Years: 50.00    Total pack years: 50.00    Types: Cigarettes    Quit date: 10/29/2021    Years since quitting: 0.8   Smokeless tobacco: Never  Vaping Use   Vaping Use: Never used  Substance and Sexual Activity   Alcohol use: No    Alcohol/week: 0.0 standard drinks of alcohol   Drug use: No   Sexual activity: Never  Other Topics Concern   Not on file  Social History Narrative   Patient consumes no caffeine   Social Determinants of Health   Financial Resource Strain: Not on file  Food Insecurity: Not on file  Transportation Needs: Not on file  Physical Activity: Not on file  Stress: Not on file  Social Connections: Not on file     Family History: The patient's family history includes Cancer in her brother; Cirrhosis in her father; Drug abuse in her brother; Heart attack in an other family member; Hypertension in her mother; Peripheral vascular disease in her mother. There is no history of Breast cancer or Stroke.  ROS:   Please see the history of present illness.    Exertional fatigue all other systems reviewed and are negative.  EKGs/Labs/Other Studies Reviewed:    The following studies were reviewed today:  ECHOCARDIOGRAM 07/14/2022: IMPRESSIONS   1. Left ventricular ejection fraction, by estimation, is 50 to 55%. The  left ventricle has normal function. The left ventricle demonstrates  regional wall motion abnormalities, mild septal hypokinesis. Left  ventricular diastolic parameters are consistent   with Grade I diastolic dysfunction (impaired relaxation).   2. Right ventricular systolic function is normal. The right ventricular  size is normal. There is normal pulmonary artery systolic pressure. The  estimated right ventricular systolic pressure is 00.1 mmHg.  3. Left atrial size was mildly dilated.    4. Right atrial size was mildly dilated.   5. The mitral valve is normal in structure. Trivial mitral valve  regurgitation. No evidence of mitral stenosis.   6. The aortic valve is tricuspid. Aortic valve regurgitation is moderate,  no holodiastolic flow reversal in the descending thoracic aorta. No aortic  stenosis is present.   7. Aortic dilatation noted. There is moderate dilatation of the ascending  aorta, measuring 44 mm.   8. The inferior vena cava is normal in size with greater than 50%  respiratory variability, suggesting right atrial pressure of 3 mmHg.    [place] 08/04/2022: Study Highlights      NSR ave HR 88 bpm   Two non-sustained VT episodes with longest 11 beats and fastest 255 bpm   24 SVT episodes, longest 12 beats, fastest 193 bpm   Atrial Fibrillation burden 14% with ave HR 104 bpm   PVC burden 1%   PAC burden 2%   EP consult for ablation consideration or amiodarone suppression may be warranted.  EKG:  EKG not repeated at  Recent Labs: 11/13/2021: Magnesium 2.5; TSH 1.934 04/16/2022: ALT 12 05/19/2022: Hemoglobin 10.9; Platelets 203 07/26/2022: BUN 16; Creatinine, Ser 1.32; Potassium 3.0; Sodium 145  Recent Lipid Panel    Component Value Date/Time   CHOL 134 04/20/2016 1409   TRIG 99 04/20/2016 1409   HDL 54 04/20/2016 1409   CHOLHDL 2.5 04/20/2016 1409   VLDL 20 04/20/2016 1409   LDLCALC 60 04/20/2016 1409    Physical Exam:    VS:  BP 104/68   Pulse 66   Ht 5' 6"  (1.676 m)   Wt 168 lb 12.8 oz (76.6 kg)   SpO2 96%   BMI 27.25 kg/m     Wt Readings from Last 3 Encounters:  08/30/22 168 lb 12.8 oz (76.6 kg)  08/11/22 169 lb (76.7 kg)  08/04/22 168 lb 12.8 oz (76.6 kg)     GEN: Weight is stable. No acute distress HEENT: Normal NECK: No JVD. LYMPHATICS: No lymphadenopathy CARDIAC: 2/6 systolic and 2/6 decrescendo diastolic left mid sternal and right upper sternal murmur.  Irregular RR no gallop, or edema. VASCULAR:  Normal Pulses. No  bruits. RESPIRATORY:  Clear to auscultation without rales, wheezing or rhonchi  ABDOMEN: Soft, non-tender, non-distended, No pulsatile mass, MUSCULOSKELETAL: No deformity  SKIN: Warm and dry NEUROLOGIC:  Alert and oriented x 3 PSYCHIATRIC:  Normal affect   ASSESSMENT:    1. Paroxysmal atrial fibrillation (HCC)   2. Nonrheumatic aortic valve insufficiency   3. Aortic aneurysm without rupture, unspecified portion of aorta ([place])   4. Primary hypertension   5. Other hyperlipidemia   6. Morbid obesity ([place])    PLAN:    In order of problems listed above:  Referral to electrophysiology team to determine if she is a candidate for ablation.  I am concerned that as the burden of atrial fibrillation increases, it would aggravate her underlying valvular heart condition as well as low normal LVEF may worsen. 36-month2D Doppler echocardiogram (last done in August so next will be done in February 2024).  She needs beta-blocker therapy as current because of poor rate control when in atrial fibrillation.  She will follow-up with Dr. CGlenford Bayleyas her cardiologist going forward. 2D Doppler echocardiogram in February Blood pressure is relatively low.  She is on multiple medications that can lower the blood pressure including Catapres, Cardura, Lozol, Cozaar, Toprol, a Apresoline, spironolactone..Marland KitchenMarland Kitchen  Today we have decided to discontinue Cardura and Lozol.  She will follow-up in blood pressure clinic in 1 month.  Basic metabolic panel will be done at that time.  Acceptable blood pressure range for the patient given aortic regurgitation and her age is 140/80 mmHg.  By decreasing the antihypertensive regimen, hopefully exertional tolerance will improve. Continue Lipitor   Follow-up for valvular heart disease in February with new provider as outlined above.  Evaluation by EP to determine if she is a candidate for ablation to help decrease A-fib burden.    Medication Adjustments/Labs and Tests  Ordered: Current medicines are reviewed at length with the patient today.  Concerns regarding medicines are outlined above.  Orders Placed This Encounter  Procedures   Basic metabolic panel   AMB Referral to [facility] Pharm-D   Ambulatory referral to Cardiac Electrophysiology   ECHOCARDIOGRAM COMPLETE   No orders of the defined types were placed in this encounter.   Patient Instructions  Medication Instructions:  Your physician has recommended you make the following change in your medication:   1) STOP doxazosin ([place]) 2) STOP indapamide (Lozol)  *If you need a refill on your cardiac medications before your next appointment, please call your pharmacy*   Lab Work: In 1 month at BP clinic appt: BMET If you have labs (blood work) drawn today and your tests are completely normal, you will receive your results only by: [place] (if you have MyChart) OR A paper copy in the mail If you have any lab test that is abnormal or we need to change your treatment, we will call you to review the results.  Testing/Procedures: Your physician has requested that you have an echocardiogram in March 2024. Echocardiography is a painless test that uses sound waves to create images of your heart. It provides your doctor with information about the size and shape of your heart and how well your heart's chambers and valves are working. This procedure takes approximately one hour. There are no restrictions for this procedure.  Follow-Up: At [facility], you and your health needs are our priority.  As part of our continuing mission to provide you with exceptional heart care, we have created designated Provider Care Teams.  These Care Teams include your primary Cardiologist (physician) and Advanced Practice Providers (APPs -  Physician Assistants and Nurse Practitioners) who all work together to provide you with the care you need, when you need it.  Your next appointment:   1 month with Blood  Pressure Clinic 6 month(s) (2-3 weeks after echo)  The format for your next appointment:   In Person  Provider:   Rudean Haskell  Other Instructions Your physician has referred your to our blood pressure clinic at our [facility] office. You will meet with one of our pharmacists to help manage your blood pressure.   Important Information About Sugar         Signed, Sinclair Grooms, MD  08/30/2022 3:13 PM    [facility]

## 2022-08-30 ENCOUNTER — Telehealth: Payer: Self-pay | Admitting: Interventional Cardiology

## 2022-08-30 ENCOUNTER — Ambulatory Visit: Payer: Medicare Other | Attending: Interventional Cardiology | Admitting: Interventional Cardiology

## 2022-08-30 ENCOUNTER — Encounter: Payer: Self-pay | Admitting: Interventional Cardiology

## 2022-08-30 VITALS — BP 104/68 | HR 66 | Ht 66.0 in | Wt 168.8 lb

## 2022-08-30 DIAGNOSIS — E7849 Other hyperlipidemia: Secondary | ICD-10-CM

## 2022-08-30 DIAGNOSIS — I10 Essential (primary) hypertension: Secondary | ICD-10-CM

## 2022-08-30 DIAGNOSIS — I480 Paroxysmal atrial fibrillation: Secondary | ICD-10-CM

## 2022-08-30 DIAGNOSIS — I719 Aortic aneurysm of unspecified site, without rupture: Secondary | ICD-10-CM

## 2022-08-30 DIAGNOSIS — I351 Nonrheumatic aortic (valve) insufficiency: Secondary | ICD-10-CM | POA: Diagnosis not present

## 2022-08-30 NOTE — Patient Instructions (Signed)
Medication Instructions:  Your physician has recommended you make the following change in your medication:   1) STOP doxazosin ([place]) 2) STOP indapamide (Lozol)  *If you need a refill on your cardiac medications before your next appointment, please call your pharmacy*   Lab Work: In 1 month at BP clinic appt: BMET If you have labs (blood work) drawn today and your tests are completely normal, you will receive your results only by: [place] (if you have MyChart) OR A paper copy in the mail If you have any lab test that is abnormal or we need to change your treatment, we will call you to review the results.  Testing/Procedures: Your physician has requested that you have an echocardiogram in March 2024. Echocardiography is a painless test that uses sound waves to create images of your heart. It provides your doctor with information about the size and shape of your heart and how well your heart's chambers and valves are working. This procedure takes approximately one hour. There are no restrictions for this procedure.  Follow-Up: At [facility], you and your health needs are our priority.  As part of our continuing mission to provide you with exceptional heart care, we have created designated Provider Care Teams.  These Care Teams include your primary Cardiologist (physician) and Advanced Practice Providers (APPs -  Physician Assistants and Nurse Practitioners) who all work together to provide you with the care you need, when you need it.  Your next appointment:   1 month with Blood Pressure Clinic 6 month(s) (2-3 weeks after echo)  The format for your next appointment:   In Person  Provider:   Rudean Haskell  Other Instructions Your physician has referred your to our blood pressure clinic at our [facility] office. You will meet with one of our pharmacists to help manage your blood pressure.   Important Information About Sugar

## 2022-08-30 NOTE — Telephone Encounter (Signed)
Returned call to patient.  Patient states she sometimes gets migraines and OTC medication does not help. Advised patient to contact her PCP to discuss this and possible Rx, unfortunately our office does not prescribe medication for headaches.  Patient verbalized understanding and expressed appreciation for call.

## 2022-08-30 NOTE — Telephone Encounter (Signed)
Patient would like to know if Dr. Tamala Julian can send over a prescription for headaches.  She was in today to see him and she forgot to mention it to him.

## 2022-09-01 ENCOUNTER — Ambulatory Visit: Payer: Medicare Other | Admitting: Surgery

## 2022-09-01 ENCOUNTER — Other Ambulatory Visit: Payer: Medicare Other

## 2022-09-01 ENCOUNTER — Other Ambulatory Visit: Payer: Self-pay

## 2022-09-01 ENCOUNTER — Inpatient Hospital Stay: Payer: Medicare Other | Attending: Hematology and Oncology

## 2022-09-01 ENCOUNTER — Other Ambulatory Visit: Payer: Self-pay | Admitting: Interventional Cardiology

## 2022-09-01 DIAGNOSIS — D709 Neutropenia, unspecified: Secondary | ICD-10-CM

## 2022-09-01 DIAGNOSIS — D72819 Decreased white blood cell count, unspecified: Secondary | ICD-10-CM | POA: Diagnosis not present

## 2022-09-01 DIAGNOSIS — E7849 Other hyperlipidemia: Secondary | ICD-10-CM

## 2022-09-01 LAB — CBC WITH DIFFERENTIAL (CANCER CENTER ONLY)
Abs Immature Granulocytes: 0 10*3/uL (ref 0.00–0.07)
Basophils Absolute: 0 10*3/uL (ref 0.0–0.1)
Basophils Relative: 1 %
Eosinophils Absolute: 0.2 10*3/uL (ref 0.0–0.5)
Eosinophils Relative: 4 %
HCT: 43.7 % (ref 36.0–46.0)
Hemoglobin: 14.3 g/dL (ref 12.0–15.0)
Immature Granulocytes: 0 %
Lymphocytes Relative: 24 %
Lymphs Abs: 1 10*3/uL (ref 0.7–4.0)
MCH: 29.1 pg (ref 26.0–34.0)
MCHC: 32.7 g/dL (ref 30.0–36.0)
MCV: 88.8 fL (ref 80.0–100.0)
Monocytes Absolute: 0.4 10*3/uL (ref 0.1–1.0)
Monocytes Relative: 11 %
Neutro Abs: 2.3 10*3/uL (ref 1.7–7.7)
Neutrophils Relative %: 60 %
Platelet Count: 319 10*3/uL (ref 150–400)
RBC: 4.92 MIL/uL (ref 3.87–5.11)
RDW: 12.5 % (ref 11.5–15.5)
WBC Count: 3.9 10*3/uL — ABNORMAL LOW (ref 4.0–10.5)
nRBC: 0 % (ref 0.0–0.2)

## 2022-09-01 NOTE — Progress Notes (Signed)
Patient Care Team: Trey Sailors, [place] as PCP - General (Physician Assistant) Belva Crome, MD as PCP - Cardiology (Cardiology)  DIAGNOSIS:  Encounter Diagnosis  Name Primary?   Neutropenia, unspecified type ([place])    CHIEF COMPLIANT: Follow-up of Leukopenia    INTERVAL HISTORY: Chelsea [name] is a 71 y.o. with the above-mentioned. She presents to the clinic for labs and a follow-up. She states that she is doing good. She just had her colostomy bag reversed.  She is very happy with the final outcome.  She is now able to go to the bathroom normally.  She has not had any recent increased infections.    ALLERGIES:  is allergic to other, hydrocodone, and oxycodone.  MEDICATIONS:  Current Outpatient Medications  Medication Sig Dispense Refill   amLODipine (NORVASC) 10 MG tablet TAKE 1 TABLET (10 MG TOTAL) BY MOUTH DAILY FOR BLOOD PRESSURE (Patient taking differently: Take 10 mg by mouth daily as needed (elevated bp 180/110).) 90 tablet 3   apixaban (ELIQUIS) 5 MG TABS tablet Take 1 tablet (5 mg total) by mouth 2 (two) times daily. 60 tablet 11   Ascorbic Acid (VITAMIN C) 1000 MG tablet Take 1,000 mg by mouth in the morning.     atorvastatin (LIPITOR) 10 MG tablet Take 1 tablet (10 mg total) by mouth daily. Please attend scheduled appointment for additional refills. 1st Attempt. 30 tablet 0   b complex vitamins capsule Take 1 capsule by mouth in the morning.     B Complex-C (B-COMPLEX WITH VITAMIN C) tablet Take 1 tablet by mouth in the morning.     Biotin 5000 MCG TABS Take 5,000 mcg by mouth in the morning.     chlorhexidine (PERIDEX) 0.12 % solution Use as directed 15 mLs in the mouth or throat daily.     cholecalciferol (VITAMIN D) 25 MCG (1000 UNIT) tablet Take 1,000 Units by mouth daily as needed (with vertigo).     citalopram (CELEXA) 20 MG tablet Take 20 mg by mouth in the morning and at bedtime.     cloNIDine (CATAPRES) 0.3 MG tablet Take 0.3 mg by mouth 2 (two) times  daily.     ezetimibe (ZETIA) 10 MG tablet Take 10 mg by mouth 2 (two) times a week.     Ferrous Gluconate (IRON 27 PO) Take by mouth in the morning.     gabapentin (NEURONTIN) 800 MG tablet Take 1 tablet (800 mg total) by mouth 2 (two) times daily. (Patient taking differently: Take 400 mg by mouth 2 (two) times daily as needed (nerve pain.).) 180 tablet 3   hydrALAZINE (APRESOLINE) 25 MG tablet Take 1 tablet (25 mg total) by mouth 3 (three) times daily. 270 tablet 3   losartan (COZAAR) 50 MG tablet Take 1 tablet (50 mg total) by mouth daily. 90 tablet 3   meclizine (ANTIVERT) 25 MG tablet Take 25 mg by mouth 3 (three) times daily as needed (vertigo).     methocarbamol (ROBAXIN) 750 MG tablet Take 1 tablet (750 mg total) by mouth 3 (three) times daily. (Patient taking differently: Take 750 mg by mouth every 8 (eight) hours as needed for muscle spasms.) 60 tablet 0   metoprolol succinate (TOPROL-XL) 100 MG 24 hr tablet Take 1 tablet (100 mg total) by mouth daily. Take with or immediately following a meal. 15 tablet 0   mirabegron ER (MYRBETRIQ) 25 MG TB24 tablet Take 25 mg by mouth in the morning.     Multiple Vitamin (MULTIVITAMIN  WITH MINERALS) TABS tablet Take 1 tablet by mouth in the morning.     nicotine (NICODERM CQ - DOSED IN MG/24 HOURS) 21 mg/24hr patch PLACE 1 PATCH (21 MG TOTAL) ONTO THE SKIN DAILY. 28 patch 0   ondansetron (ZOFRAN-ODT) 4 MG disintegrating tablet Take 4 mg by mouth every 8 (eight) hours as needed.     pantoprazole (PROTONIX) 40 MG tablet Take 40 mg by mouth daily before breakfast.     potassium chloride SA (KLOR-CON M) 20 MEQ tablet Take 1 tablet (20 mEq total) by mouth daily. 90 tablet 3   spironolactone (ALDACTONE) 25 MG tablet TAKE 1 TABLET (25 MG TOTAL) BY MOUTH DAILY. 90 tablet 2   traMADol (ULTRAM) 50 MG tablet Take 1 tablet (50 mg total) by mouth every 6 (six) hours as needed for severe pain. 25 tablet 1   Vitamin A 2400 MCG (8000 UT) CAPS Take 2,400 mcg by mouth in  the morning.     vitamin B-12 (CYANOCOBALAMIN) 1000 MCG tablet Take 1,000 mcg by mouth in the morning.     vitamin E 180 MG (400 UNITS) capsule Take 400 Units by mouth in the morning.     No current facility-administered medications for this visit.    PHYSICAL EXAMINATION: ECOG PERFORMANCE STATUS: 1 - Symptomatic but completely ambulatory  Vitals:   09/06/22 1336  BP: (!) 150/83  Pulse: (!) 111  Resp: 18  Temp: (!) 97.2 F (36.2 C)  SpO2: 98%   Filed Weights   09/06/22 1336  Weight: 174 lb 4.8 oz (79.1 kg)      LABORATORY DATA:  I have reviewed the data as listed    Latest Ref Rng & Units 07/26/2022   12:18 PM 05/19/2022    1:17 AM 05/18/2022    3:39 AM  CMP  Glucose 70 - 99 mg/dL 75  102  109   BUN 8 - 27 mg/dL '16  14  18   '$ Creatinine 0.57 - 1.00 mg/dL 1.32  1.02  1.29   Sodium 134 - 144 mmol/L 145  136  137   Potassium 3.5 - 5.2 mmol/L 3.0  3.7  3.7   Chloride 96 - 106 mmol/L 99  101  100   CO2 20 - 29 mmol/L '30  28  26   '$ Calcium 8.7 - 10.3 mg/dL 10.1  9.0  9.2     Lab Results  Component Value Date   WBC 3.9 (L) 09/01/2022   HGB 14.3 09/01/2022   HCT 43.7 09/01/2022   MCV 88.8 09/01/2022   PLT 319 09/01/2022   NEUTROABS 2.3 09/01/2022    ASSESSMENT & PLAN:  Leukopenia Lab review: 08/29/2012: WBC 4.6 11/06/2021: WBC 12.6 11/13/2021: WBC 12, platelets 415 Feb 2023: WBC 2.6, ANC 1.44, ALC 0.79 02/12/2022: WBC 3.7, hemoglobin 13.6, platelets 359, ANC 2.3, B12 2225 05/18/2022: WBC 15.2 09/01/2022: WBC 3.9, hemoglobin 14.3, platelets 319, ANC 2.3   Hospitalization  11/04/2021-11/13/2021: Perforated diverticulitis status post Hartman's procedure, postop ileus, A-fib, AKI 05/17/2022-05/21/2022: Status post colectomy takedown  Patient has a ability to increase her white blood cell count when necessary.  This is evidence and hospitalization in June.  The white count had gone up to 15.2.  It has since come down and most recent labs showed a WBC count of 3.9.  There  is no suspicion of hematologic disorder. Return to clinic on an as-needed basis.    No orders of the defined types were placed in this encounter.  The patient has  a good understanding of the overall plan. she agrees with it. she will call with any problems that may develop before the next visit here. Total time spent: 30 mins including face to face time and time spent for planning, charting and co-ordination of care   Harriette Ohara, MD 09/06/22    I Gardiner Coins am scribing for Dr. Lindi Adie  I have reviewed the above documentation for accuracy and completeness, and I agree with the above.

## 2022-09-06 ENCOUNTER — Inpatient Hospital Stay (HOSPITAL_BASED_OUTPATIENT_CLINIC_OR_DEPARTMENT_OTHER): Payer: Medicare Other | Admitting: Hematology and Oncology

## 2022-09-06 ENCOUNTER — Other Ambulatory Visit: Payer: Self-pay

## 2022-09-06 DIAGNOSIS — D709 Neutropenia, unspecified: Secondary | ICD-10-CM | POA: Diagnosis not present

## 2022-09-06 DIAGNOSIS — D72819 Decreased white blood cell count, unspecified: Secondary | ICD-10-CM | POA: Diagnosis not present

## 2022-09-06 NOTE — Assessment & Plan Note (Signed)
Lab review: 08/29/2012: WBC 4.6 11/06/2021: WBC 12.6 11/13/2021: WBC 12, platelets 415 Feb 2023: WBC 2.6, ANC 1.44, ALC 0.79 02/12/2022: WBC 3.7, hemoglobin 13.6, platelets 359, ANC 2.3, B12 2225 05/18/2022: WBC 15.2 09/01/2022: WBC 3.9, hemoglobin 14.3, platelets 319, ANC 2.3  Hospitalization  11/04/2021-11/13/2021: Perforated diverticulitis status post Hartman's procedure, postop ileus, A-fib, AKI 05/17/2022-05/21/2022: Status post colectomy takedown  Patient has a ability to increase her white blood cell count when necessary.  This is evidence and hospitalization in June.  The white count had gone up to 15.2.  It has since come down and most recent labs showed a WBC count of 3.9.  There is no suspicion of hematologic disorder. Return to clinic on an as-needed basis.

## 2022-09-13 DIAGNOSIS — G44209 Tension-type headache, unspecified, not intractable: Secondary | ICD-10-CM | POA: Diagnosis not present

## 2022-09-13 DIAGNOSIS — R600 Localized edema: Secondary | ICD-10-CM | POA: Diagnosis not present

## 2022-09-13 DIAGNOSIS — K219 Gastro-esophageal reflux disease without esophagitis: Secondary | ICD-10-CM | POA: Diagnosis not present

## 2022-09-13 DIAGNOSIS — I10 Essential (primary) hypertension: Secondary | ICD-10-CM | POA: Diagnosis not present

## 2022-09-13 DIAGNOSIS — E782 Mixed hyperlipidemia: Secondary | ICD-10-CM | POA: Diagnosis not present

## 2022-09-14 DIAGNOSIS — M4316 Spondylolisthesis, lumbar region: Secondary | ICD-10-CM | POA: Diagnosis not present

## 2022-09-21 ENCOUNTER — Other Ambulatory Visit: Payer: Self-pay | Admitting: Interventional Cardiology

## 2022-09-21 DIAGNOSIS — E7849 Other hyperlipidemia: Secondary | ICD-10-CM

## 2022-09-23 DIAGNOSIS — M47817 Spondylosis without myelopathy or radiculopathy, lumbosacral region: Secondary | ICD-10-CM | POA: Diagnosis not present

## 2022-10-07 ENCOUNTER — Ambulatory Visit: Payer: Medicare Other

## 2022-10-07 ENCOUNTER — Ambulatory Visit: Payer: Medicare Other | Attending: Interventional Cardiology

## 2022-10-07 DIAGNOSIS — I10 Essential (primary) hypertension: Secondary | ICD-10-CM

## 2022-10-08 LAB — BASIC METABOLIC PANEL
BUN/Creatinine Ratio: 15 (ref 12–28)
BUN: 17 mg/dL (ref 8–27)
CO2: 30 mmol/L — ABNORMAL HIGH (ref 20–29)
Calcium: 9.1 mg/dL (ref 8.7–10.3)
Chloride: 98 mmol/L (ref 96–106)
Creatinine, Ser: 1.16 mg/dL — ABNORMAL HIGH (ref 0.57–1.00)
Glucose: 83 mg/dL (ref 70–99)
Potassium: 3.7 mmol/L (ref 3.5–5.2)
Sodium: 141 mmol/L (ref 134–144)
eGFR: 51 mL/min/{1.73_m2} — ABNORMAL LOW (ref >59)

## 2022-10-26 NOTE — Progress Notes (Unsigned)
Electrophysiology Office Note:    Date:  10/27/2022   ID:  Chelsea [name], DOB [birth year], MRN [id number]  PCP:  Trey Sailors, [place]  [facility] Cardiologist:  Sinclair Grooms, MD  [place] Electrophysiologist:  Vickie Epley, MD   Referring MD: Belva Crome, MD   Chief Complaint: AF  History of Present Illness:    Chelsea [name] is a 71 y.o. female who presents for an evaluation of AF at the request of Dr Tamala Julian. Their medical history includes AI, stroke, HLD, HTN, obesity. Previous AF burden was 14%.   She last saw Dr Tamala Julian 08/30/2022. She is referred to discuss possible catheter ablation. She takes eliquis for stroke ppx. She is active. Able to do all ADLs, shops, etc. Does feel fatigued when in AF.     Past Medical History:  Diagnosis Date   Anxiety    Aortic aneurysm without rupture [facility])    followed by dr dr h. 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 [facility]   (nuclear stress test 01-31-20121 normal w/ ef 54%; cardaic cath 08-07-2010 no sig coronary obstruction, preserved LVF, mild AR   Multinodular goiter    followed by pcp,  last ultrasound in epic 03-20-2020 , never met critiria for bx   Nonrheumatic aortic (valve) insufficiency    followed  by cardiologist--- dr h. 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,  followed by dr atRexene Alberts Urge incontinence    Wears glasses    Wears hearing aid in both ears    Wears partial dentures    upper and lower    Past Surgical History:  Procedure Laterality Date   ABDOMINAL HYSTERECTOMY     CARDIAC CATHETERIZATION  [facility]/07/2010   [facility]  no sig coronary obstruction, preserved LVF, mild AR   COLON RESECTION SIGMOID N/A 11/08/2021   Procedure: COLON RESECTION SIGMOID; DRAINAGE OF INTRAABDOMINAL ABSCESS;  Surgeon: ThGeorganna SkeansMD;  [facility]: General;  Laterality: N/A;   COLOSTOMY N/A 11/08/2021   Procedure: COLOSTOMY;  Surgeon: ThGeorganna SkeansMD;  [place] [facility] [facility];  Laterality: N/A;   COLOSTOMY REVERSAL N/A 05/17/2022   Procedure: COLOSTOMY  REVERSAL ([facility]ARTMAN);  Surgeon: Georganna Skeans, MD;  Location: [place];  Service: General;  Laterality: N/A;   MASS EXCISION N/A 06/04/2021   Procedure: EXCISION SUCUTANEOUS MASS X2, UPPER BACK;  Surgeon: Clovis Riley, MD;  Location: [place];  Service: General;  Laterality: N/A;   TOTAL KNEE ARTHROPLASTY Left 01/05/2016   Procedure: TOTAL KNEE ARTHROPLASTY;  Surgeon: Frederik Pear, MD;  Location: [place];  Service: Orthopedics;  Laterality: Left;   TUBAL LIGATION Bilateral 2000    Current Medications: Current Meds  Medication Sig   amLODipine-valsartan (EXFORGE) 10-320 MG tablet Take 1 tablet by mouth daily.   apixaban (ELIQUIS) 5 MG TABS tablet Take 1 tablet (5 mg total) by mouth 2 (two) times daily.   Ascorbic Acid (VITAMIN C) 1000 MG tablet Take 1,000  mg by mouth in the morning.   atorvastatin (LIPITOR) 10 MG tablet TAKE 1 TABLET BY MOUTH DAILY   b complex vitamins capsule Take 1 capsule by mouth in the morning.   B Complex-C (B-COMPLEX WITH VITAMIN C) tablet Take 1 tablet by mouth in the morning.   Biotin 5000 MCG TABS Take 5,000 mcg by mouth in the morning.   chlorhexidine (PERIDEX) 0.12 % solution Use as directed 15 mLs in the mouth or throat daily.   cholecalciferol (VITAMIN D) 25 MCG (1000 UNIT) tablet Take 1,000 Units by mouth daily as needed (with vertigo).   citalopram (CELEXA) 20 MG tablet Take 20 mg by mouth in the morning and at bedtime.   cloNIDine (CATAPRES) 0.3 MG tablet Take 0.3 mg by mouth 2 (two) times daily.   ezetimibe (ZETIA) 10 MG tablet Take 10 mg by mouth 2 (two) times a week.   Ferrous Gluconate (IRON 27 PO) Take by mouth in the morning.   gabapentin (NEURONTIN) 800 MG tablet Take 1 tablet (800 mg total) by mouth 2 (two) times daily. (Patient taking differently: Take 400 mg by mouth 2 (two) times daily as needed (nerve pain.).)   hydrALAZINE (APRESOLINE) 25 MG tablet Take 1 tablet (25 mg total) by mouth 3 (three) times daily.   losartan (COZAAR) 50 MG tablet Take 1 tablet (50 mg total) by mouth daily.   meclizine (ANTIVERT) 25 MG tablet Take 25 mg by mouth 3 (three) times daily as needed (vertigo).   methocarbamol (ROBAXIN) 750 MG tablet Take 1 tablet (750 mg total) by mouth 3 (three) times daily. (Patient taking differently: Take 750 mg by mouth every 8 (eight) hours as needed for muscle spasms.)   metoprolol succinate (TOPROL-XL) 100 MG 24 hr tablet Take 1 tablet (100 mg total) by mouth daily. Take with or immediately following a meal.   mirabegron ER (MYRBETRIQ) 25 MG TB24 tablet Take 25 mg by mouth in the morning.   Multiple Vitamin (MULTIVITAMIN WITH MINERALS) TABS tablet Take 1 tablet by mouth in the morning.   nicotine (NICODERM CQ - DOSED IN MG/24 HOURS) 21 mg/24hr patch PLACE 1 PATCH (21 MG TOTAL) ONTO THE SKIN  DAILY.   ondansetron (ZOFRAN) 8 MG tablet Take 8 mg by mouth 3 (three) times daily as needed for nausea or vomiting.   pantoprazole (PROTONIX) 40 MG tablet Take 40 mg by mouth daily before breakfast.   potassium chloride SA (KLOR-CON M) 20 MEQ tablet Take 1 tablet (20 mEq total) by mouth daily.   spironolactone (ALDACTONE) 25 MG tablet TAKE 1 TABLET (25 MG TOTAL) BY MOUTH DAILY.   traMADol (ULTRAM) 50 MG tablet Take 1 tablet (50 mg total) by mouth every  6 (six) hours as needed for severe pain.   Vitamin A 2400 MCG (8000 UT) CAPS Take 2,400 mcg by mouth in the morning.   vitamin B-12 (CYANOCOBALAMIN) 1000 MCG tablet Take 1,000 mcg by mouth in the morning.   vitamin E 180 MG (400 UNITS) capsule Take 400 Units by mouth in the morning.     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  Tobacco Use   Smoking status: Former    Packs/day: 1.00    Years: 50.00    Total pack years: 50.00    Types: Cigarettes    Quit date: 10/29/2021    Years since quitting: 0.9   Smokeless tobacco: Never  Vaping Use   Vaping Use: Never used  Substance and Sexual Activity   Alcohol use: No    Alcohol/week: 0.0 standard drinks of alcohol   Drug use: No   Sexual activity: Never  Other Topics Concern   Not on file  Social History Narrative   Patient consumes no caffeine   Social Determinants of Health   Financial Resource Strain: Not on file  Food Insecurity: Not on file  Transportation Needs: Not on file  Physical Activity: Not on file  Stress: Not on file  Social Connections: Not on file     Family History: The patient's family history includes Cancer in her brother; Cirrhosis in her father;  Drug abuse in her brother; Heart attack in an other family member; Hypertension in her mother; Peripheral vascular disease in her mother. There is no history of Breast cancer or Stroke.  ROS:   Please see the history of present illness.    All other systems reviewed and are negative.  EKGs/Labs/Other Studies Reviewed:    The following studies were reviewed today:  06/2022 echo - EF 50, LV size normal, RV normal, mildly dilated atria, trivial MR, moderate AI  08/27/2022 zio   NSR ave HR 88 bpm   Two non-sustained VT episodes with longest 11 beats and fastest 255 bpm   24 SVT episodes, longest 12 beats, fastest 193 bpm   Atrial Fibrillation burden 14% with ave HR 104 bpm   PVC burden 1%   PAC burden 2%    Recent Labs: 11/13/2021: Magnesium 2.5; TSH 1.934 04/16/2022: ALT 12 09/01/2022: Hemoglobin 14.3; Platelet Count 319 10/07/2022: BUN 17; Creatinine, Ser 1.16; Potassium 3.7; Sodium 141  Recent Lipid Panel    Component Value Date/Time   CHOL 134 04/20/2016 1409   TRIG 99 04/20/2016 1409   HDL 54 04/20/2016 1409   CHOLHDL 2.5 04/20/2016 1409   VLDL 20 04/20/2016 1409   LDLCALC 60 04/20/2016 1409    Physical Exam:    VS:  BP 122/78   Pulse 66   Ht _0  (1.676 m)   Wt 173 lb (78.5 kg)   SpO2 97%   BMI 27.92 kg/m     Wt Readings from Last 3 Encounters:  10/27/22 173 lb (78.5 kg)  09/06/22 174 lb 4.8 oz (79.1 kg)  08/30/22 168 lb 12.8 oz (76.6 kg)     GEN:  Well nourished, well developed in no acute distress HEENT: Normal  NECK: No JVD; No carotid bruits LYMPHATICS: No lymphadenopathy CARDIAC: RRR, no murmurs, rubs, gallops RESPIRATORY:  Clear to auscultation without rales, wheezing or rhonchi  ABDOMEN: Soft, non-tender, non-distended MUSCULOSKELETAL:  No edema; No deformity  SKIN: Warm and dry NEUROLOGIC:  Alert and oriented x 3 PSYCHIATRIC:  Normal affect       ASSESSMENT:    1. Paroxysmal atrial fibrillation (HCC)   2. Primary hypertension    PLAN:     In order of problems listed above:   #AF Symptomatic.   Discussed treatment options today for their AF including antiarrhythmic drug therapy and ablation. Discussed risks, recovery and likelihood of success. Discussed potential need for repeat ablation procedures and antiarrhythmic drugs after an initial ablation. They wish to proceed with scheduling.  Risk, benefits, and alternatives to EP study and radiofrequency ablation for afib were also discussed in detail today. These risks include but are not limited to stroke, bleeding, vascular damage, tamponade, perforation, damage to the esophagus, lungs, and other structures, pulmonary vein stenosis, worsening renal function, and death. The patient understands these risk and wishes to proceed.  We will therefore proceed with catheter ablation at the next available time.  Carto, ICE, anesthesia are requested for the procedure.  Will also obtain CT PV protocol prior to the procedure to exclude LAA thrombus and further evaluate atrial anatomy.  #Hypertension At goal today.  Recommend checking blood pressures 1-2 times per week at home and recording the values.  Recommend bringing these recordings to the primary care physician.  #Ascending aortic aneurysm Follows with Dr Tamala Julian and Cyndia Bent.   Medication Adjustments/Labs and Tests Ordered: Current medicines are reviewed at length with the patient today.  Concerns regarding medicines are outlined above.  Orders Placed This Encounter  Procedures   CT CARDIAC MORPH/PULM VEIN W/CM&W/O CA SCORE   Basic metabolic panel   CBC   No orders of the defined types were placed in this encounter.    Signed, Hilton Cork. Quentin Ore, MD, [facility], [facility] 10/27/2022 11:25 PM    Electrophysiology [facility]

## 2022-10-27 ENCOUNTER — Encounter: Payer: Self-pay | Admitting: Cardiology

## 2022-10-27 ENCOUNTER — Ambulatory Visit: Payer: Medicare Other | Attending: Cardiology | Admitting: Cardiology

## 2022-10-27 VITALS — BP 122/78 | HR 66 | Ht 66.0 in | Wt 173.0 lb

## 2022-10-27 DIAGNOSIS — I10 Essential (primary) hypertension: Secondary | ICD-10-CM | POA: Diagnosis not present

## 2022-10-27 DIAGNOSIS — I480 Paroxysmal atrial fibrillation: Secondary | ICD-10-CM | POA: Diagnosis not present

## 2022-10-27 NOTE — Patient Instructions (Addendum)
Medication Instructions:  Your physician recommends that you continue on your current medications as directed. Please refer to the Current Medication list given to you today.  *If you need a refill on your cardiac medications before your next appointment, please call your pharmacy*  Lab Work: BMET and CBC prior to CT scan on 01/17/23  If you have labs (blood work) drawn today and your tests are completely normal, you will receive your results only by: [place] (if you have MyChart) OR A paper copy in the mail If you have any lab test that is abnormal or we need to change your treatment, we will call you to review the results.  Testing/Procedures: Your physician has requested that you have cardiac CT. Cardiac computed tomography (CT) is a painless test that uses an x-ray machine to take clear, detailed pictures of your heart. For further information please visit [email or web address]. Please follow instruction sheet as given. We will call you to schedule  Your physician has recommended that you have an ablation. Catheter ablation is a medical procedure used to treat some cardiac arrhythmias (irregular heartbeats). During catheter ablation, a long, thin, flexible tube is put into a blood vessel in your groin (upper thigh), or neck. This tube is called an ablation catheter. It is then guided to your heart through the blood vessel. Radio frequency waves destroy small areas of heart tissue where abnormal heartbeats may cause an arrhythmia to start. Please see the instruction sheet given to you today.  Follow-Up: At [facility], you and your health needs are our priority.  As part of our continuing mission to provide you with exceptional heart care, we have created designated Provider Care Teams.  These Care Teams include your primary Cardiologist (physician) and Advanced Practice Providers (APPs -  Physician Assistants and Nurse Practitioners) who all work together to provide you with the  care you need, when you need it.  Your next appointment:   We will call you to schedule a follow up appointment  Important Information About Sugar

## 2022-10-28 DIAGNOSIS — M47817 Spondylosis without myelopathy or radiculopathy, lumbosacral region: Secondary | ICD-10-CM | POA: Diagnosis not present

## 2022-11-02 DIAGNOSIS — I7121 Aneurysm of the ascending aorta, without rupture: Secondary | ICD-10-CM | POA: Diagnosis not present

## 2022-11-02 DIAGNOSIS — I129 Hypertensive chronic kidney disease with stage 1 through stage 4 chronic kidney disease, or unspecified chronic kidney disease: Secondary | ICD-10-CM | POA: Diagnosis not present

## 2022-11-02 DIAGNOSIS — I480 Paroxysmal atrial fibrillation: Secondary | ICD-10-CM | POA: Diagnosis not present

## 2022-11-02 DIAGNOSIS — N1831 Chronic kidney disease, stage 3a: Secondary | ICD-10-CM | POA: Diagnosis not present

## 2022-11-02 DIAGNOSIS — I5040 Unspecified combined systolic (congestive) and diastolic (congestive) heart failure: Secondary | ICD-10-CM | POA: Diagnosis not present

## 2022-11-05 ENCOUNTER — Other Ambulatory Visit: Payer: Self-pay | Admitting: Nephrology

## 2022-11-05 DIAGNOSIS — N1831 Chronic kidney disease, stage 3a: Secondary | ICD-10-CM

## 2022-11-15 ENCOUNTER — Ambulatory Visit
Admission: RE | Admit: 2022-11-15 | Discharge: 2022-11-15 | Disposition: A | Discharge status: Home / Self Care | Payer: Medicare Other | Source: Ambulatory Visit | Attending: Nephrology | Admitting: Nephrology

## 2022-11-15 DIAGNOSIS — N181 Chronic kidney disease, stage 1: Secondary | ICD-10-CM | POA: Diagnosis not present

## 2022-11-15 DIAGNOSIS — I129 Hypertensive chronic kidney disease with stage 1 through stage 4 chronic kidney disease, or unspecified chronic kidney disease: Secondary | ICD-10-CM | POA: Diagnosis not present

## 2022-11-15 DIAGNOSIS — N1831 Chronic kidney disease, stage 3a: Secondary | ICD-10-CM

## 2022-11-16 ENCOUNTER — Ambulatory Visit: Payer: Medicare Other | Attending: Internal Medicine | Admitting: Pharmacist

## 2022-11-16 VITALS — BP 130/76 | HR 96

## 2022-11-16 DIAGNOSIS — I10 Essential (primary) hypertension: Secondary | ICD-10-CM | POA: Diagnosis not present

## 2022-11-16 MED ORDER — METOPROLOL SUCCINATE ER 100 MG PO TB24: 150.0000 mg | ORAL_TABLET | Freq: Every day | ORAL | 3 refills | Status: DC

## 2022-11-16 NOTE — Patient Instructions (Addendum)
Start taking metoprolol '150mg'$  (1.5 tablets daily)  Continue taking amlodipine '10mg'$  daily, clonidine 0.'3mg'$  twice a day, hydralazine '25mg'$  three times a day, losartan '50mg'$  daily, spironolactone '25mg'$  daily and indapamide 2.'5mg'$  daily

## 2022-11-16 NOTE — Progress Notes (Unsigned)
Patient ID: Chelsea [name]                 DOB: 05/22/1951                      MRN: [id number]      HPI: Chelsea [name] is a 71 y.o. female referred by Dr. Tamala Julian to HTN clinic. PMH is significant for mild aortic regurgitation, non-cardiac chest pain, prior history of stroke with residual left-sided weakness, hyperlipidemia, hypertension, afib with upcoming ablation scheduled and moderate obesity.  Patient seen by Dr. Tamala Julian in October. Complained of exertional fatigue. Her systolic BP that day was 806. She was on multiple BP mediations. Doxazosin and indapamide were stopped.  Patient presents today to clinic. She did not have her medications with her, but for the most part knew what she was taking. I did call her pharmacy, [facility], to clarify the last time things were filled in order to verify as patient had both Exforge and losartan on medication list. Medication list has been updated. She is still taking indapamide, but not doxazosin. Denies any dizziness, lightheadedness or SOB. Denies fatigue. Only get dizzy when her vertigo acts up. Reports home BP around 128/80, 123/90 range. Denies missed doses or trouble remembering to take medications multiple times a day.   Current HTN meds: amlodipine 76m daily, clonidine 0.390mtwice a day, hydralazine 259mhree times a day, losartan 44m61mily. Metoprolol succinate 100mg7mly, spironolactone 25mg 41my, indapamide 2.5mg da54m  BP goal: Per Dr. Smith ATamala Julianable blood pressure range for the patient given aortic regurgitation and her age is 140/80 mmHg.   Family History:  Family History  Problem Relation Age of Onset   Hypertension Mother        deceased   Peripheral vascular disease Mother    Cirrhosis Father        DeCEASED   Cancer Brother    Drug abuse Brother        overdose, in 1 brother   Heart attack Other        grandmother deceased   Breast cancer Neg Hx    Stroke Neg Hx     Social History: quit smoking last  year  Diet: not addressed today  Exercise:  Exercise is limited by orthopedic condition(s): back pain.  Home BP readings: patient reports 120's/80-90   Wt Readings from Last 3 Encounters:  10/27/22 173 lb (78.5 kg)  09/06/22 174 lb 4.8 oz (79.1 kg)  08/30/22 168 lb 12.8 oz (76.6 kg)   BP Readings from Last 3 Encounters:  11/16/22 130/76  10/27/22 122/78  09/06/22 (!) 150/83   Pulse Readings from Last 3 Encounters:  11/16/22 96  10/27/22 66  09/06/22 (!) 111    Renal function: CrCl cannot be calculated (Patient's most recent lab result is older than the maximum 21 days allowed.).  Past Medical History:  Diagnosis Date   Anxiety    [facility] without rupture [facility]CC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 Tamala Julian and HTN clinic   (nuclear stress test 01-31-20121 normal w/ ef 54%; cardaic cath 08-07-2010 no sig coronary obstruction, preserved LVF, mild AR   Multinodular goiter    followed by pcp,  last ultrasound in epic 03-20-2020 , never met critiria for bx   Nonrheumatic aortic (valve) insufficiency    followed by cardiologist--- dr h. 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 90 tablet 3   apixaban (ELIQUIS) 5 MG TABS tablet Take 1 tablet (5 mg total) by mouth 2 (two) times daily. 60 tablet 11   b complex vitamins capsule Take 1 capsule by mouth in the morning.     B Complex-C (B-COMPLEX WITH VITAMIN C) tablet Take 1 tablet by mouth in the morning.     cholecalciferol (VITAMIN D) 25 MCG (1000 UNIT) tablet Take 1,000 Units by mouth daily as needed (with vertigo).     citalopram (CELEXA) 20 MG tablet Take 20 mg by mouth in the morning and at bedtime.     ezetimibe (ZETIA) 10 MG tablet Take 10 mg by mouth 2 (two) times a week.     gabapentin (NEURONTIN) 800 MG tablet Take 1 tablet (800 mg total) by mouth 2 (two) times daily. (Patient taking differently: Take 400 mg by mouth 2 (two) times daily as needed (nerve pain.).) 180 tablet 3   hydrALAZINE (APRESOLINE) 25 MG tablet Take 1 tablet (25 mg total) by mouth 3 (three) times daily. 270 tablet 3   indapamide (LOZOL) 2.5 MG tablet Take 2.5 mg by mouth daily.     losartan (COZAAR) 50 MG tablet Take 1 tablet (50 mg total) by mouth daily. 90 tablet 3   meclizine (ANTIVERT) 25 MG tablet Take 25 mg by mouth 3 (three) times daily as needed (vertigo).     methocarbamol (ROBAXIN) 750 MG tablet Take 1 tablet (750 mg total) by mouth 3 (three) times daily. (Patient taking differently: Take 750 mg by mouth every 8 (eight) hours as needed for muscle  spasms.) 60 tablet 0   ondansetron (ZOFRAN) 8 MG tablet Take 8 mg by mouth 3 (three) times daily as needed for nausea or vomiting.     pantoprazole (PROTONIX) 40 MG tablet Take 40 mg by mouth daily before breakfast.     potassium  chloride SA (KLOR-CON M) 20 MEQ tablet Take 1 tablet (20 mEq total) by mouth daily. 90 tablet 3   spironolactone (ALDACTONE) 25 MG tablet TAKE 1 TABLET (25 MG TOTAL) BY MOUTH DAILY. 90 tablet 2   traMADol (ULTRAM) 50 MG tablet Take 1 tablet (50 mg total) by mouth every 6 (six) hours as needed for severe pain. 25 tablet 1   vitamin B-12 (CYANOCOBALAMIN) 1000 MCG tablet Take 1,000 mcg by mouth in the morning.     vitamin E 180 MG (400 UNITS) capsule Take 400 Units by mouth in the morning.     Ascorbic Acid (VITAMIN C) 1000 MG tablet Take 1,000 mg by mouth in the morning.     atorvastatin (LIPITOR) 10 MG tablet TAKE 1 TABLET BY MOUTH DAILY 90 tablet 1   Biotin 5000 MCG TABS Take 5,000 mcg by mouth in the morning.     chlorhexidine (PERIDEX) 0.12 % solution Use as directed 15 mLs in the mouth or throat daily.     cloNIDine (CATAPRES) 0.3 MG tablet Take 0.3 mg by mouth 2 (two) times daily.     Multiple Vitamin (MULTIVITAMIN WITH MINERALS) TABS tablet Take 1 tablet by mouth in the morning.     Vitamin A 2400 MCG (8000 UT) CAPS Take 2,400 mcg by mouth in the morning.     No current facility-administered medications on file prior to visit.    Allergies  Allergen Reactions   Other Swelling    Hair dye, caused eye swelling, multiple times experienced this reaction   Hydrocodone Nausea And Vomiting   Oxycodone Nausea And Vomiting    Other reaction(s): Unknown    Blood pressure 130/76, pulse 96, SpO2 98 %.   Assessment/Plan:  1. Hypertension -  Essential hypertension Assessment: BP is controlled in office BP 130/ 76 mmHg at the goal (<130/80) or <140/80. No s/sx of low BP Tolerates medications well without any side effects She is on a large amount of blood  pressure medications but home readings are borderline and office reading is just at goal as well  She denies any compliance issues or s/sx of low BP HR elevated in clinic today  Plan:  Increase metoprolol succinate to 178m daily Continue taking amlodipine 166mdaily, clonidine 0.16m70mwice a day, hydralazine 53m57mree times a day, losartan 50mg816mly, spironolactone 53mg 62my and indapamide 2.5mg da26m Follow up with PharmD as needed   Thank you  MelissaRamond Dial.D, BCPS, CPP [facility].[address]  [address]: [phone]

## 2022-11-16 NOTE — Assessment & Plan Note (Signed)
Assessment: BP is controlled in office BP 130/ 76 mmHg at the goal (<130/80) or <140/80. No s/sx of low BP Tolerates medications well without any side effects She is on a large amount of blood pressure medications but home readings are borderline and office reading is just at goal as well  She denies any compliance issues or s/sx of low BP HR elevated in clinic today  Plan:  Increase metoprolol succinate to '150mg'$  daily Continue taking amlodipine '10mg'$  daily, clonidine 0.'3mg'$  twice a day, hydralazine '25mg'$  three times a day, losartan '50mg'$  daily, spironolactone '25mg'$  daily and indapamide 2.'5mg'$  daily Follow up with PharmD as needed

## 2022-12-02 ENCOUNTER — Other Ambulatory Visit: Payer: Self-pay | Admitting: Interventional Cardiology

## 2022-12-02 DIAGNOSIS — E7849 Other hyperlipidemia: Secondary | ICD-10-CM

## 2022-12-06 ENCOUNTER — Telehealth: Payer: Self-pay | Admitting: Internal Medicine

## 2022-12-06 MED ORDER — AMLODIPINE BESYLATE 10 MG PO TABS: 10.0000 mg | ORAL_TABLET | Freq: Every day | ORAL | 2 refills | Status: AC

## 2022-12-06 NOTE — Telephone Encounter (Signed)
Pt's medication was sent to pt's pharmacy as requested. Confirmation received.  °

## 2022-12-06 NOTE — Telephone Encounter (Signed)
*  STAT* If patient is at the pharmacy, call can be transferred to refill team.   1. Which medications need to be refilled? (please list name of each medication and dose if known) amLODipine (NORVASC) 10 MG tablet   2. Which pharmacy/location (including street and city if local pharmacy) is medication to be sent to? [facility], [place]   3. Do they need a 30 day or 90 day supply? 90 day

## 2022-12-09 DIAGNOSIS — I10 Essential (primary) hypertension: Secondary | ICD-10-CM | POA: Diagnosis not present

## 2022-12-09 DIAGNOSIS — E782 Mixed hyperlipidemia: Secondary | ICD-10-CM | POA: Diagnosis not present

## 2022-12-09 DIAGNOSIS — K219 Gastro-esophageal reflux disease without esophagitis: Secondary | ICD-10-CM | POA: Diagnosis not present

## 2022-12-09 DIAGNOSIS — Z131 Encounter for screening for diabetes mellitus: Secondary | ICD-10-CM | POA: Diagnosis not present

## 2022-12-09 DIAGNOSIS — R600 Localized edema: Secondary | ICD-10-CM | POA: Diagnosis not present

## 2022-12-09 DIAGNOSIS — Z0000 Encounter for general adult medical examination without abnormal findings: Secondary | ICD-10-CM | POA: Diagnosis not present

## 2022-12-09 DIAGNOSIS — G44209 Tension-type headache, unspecified, not intractable: Secondary | ICD-10-CM | POA: Diagnosis not present

## 2023-01-13 ENCOUNTER — Other Ambulatory Visit: Payer: Self-pay | Admitting: Physician Assistant

## 2023-01-13 DIAGNOSIS — Z1231 Encounter for screening mammogram for malignant neoplasm of breast: Secondary | ICD-10-CM

## 2023-01-17 ENCOUNTER — Ambulatory Visit: Payer: 59 | Attending: Internal Medicine

## 2023-01-17 DIAGNOSIS — I10 Essential (primary) hypertension: Secondary | ICD-10-CM

## 2023-01-17 DIAGNOSIS — I480 Paroxysmal atrial fibrillation: Secondary | ICD-10-CM

## 2023-01-18 LAB — CBC
Hematocrit: 39.9 % (ref 34.0–46.6)
Hemoglobin: 13.4 g/dL (ref 11.1–15.9)
MCH: 29.5 pg (ref 26.6–33.0)
MCHC: 33.6 g/dL (ref 31.5–35.7)
MCV: 88 fL (ref 79–97)
Platelets: 313 10*3/uL (ref 150–450)
RBC: 4.55 x10E6/uL (ref 3.77–5.28)
RDW: 13.1 % (ref 11.7–15.4)
WBC: 5.2 10*3/uL (ref 3.4–10.8)

## 2023-01-18 LAB — BASIC METABOLIC PANEL
BUN/Creatinine Ratio: 14 (ref 12–28)
BUN: 17 mg/dL (ref 8–27)
CO2: 27 mmol/L (ref 20–29)
Calcium: 9.9 mg/dL (ref 8.7–10.3)
Chloride: 98 mmol/L (ref 96–106)
Creatinine, Ser: 1.22 mg/dL — ABNORMAL HIGH (ref 0.57–1.00)
Glucose: 86 mg/dL (ref 70–99)
Potassium: 3.2 mmol/L — ABNORMAL LOW (ref 3.5–5.2)
Sodium: 142 mmol/L (ref 134–144)
eGFR: 47 mL/min/{1.73_m2} — ABNORMAL LOW (ref >59)

## 2023-01-25 ENCOUNTER — Telehealth: Payer: Self-pay

## 2023-01-25 MED ORDER — POTASSIUM CHLORIDE CRYS ER 20 MEQ PO TBCR: 20.0000 meq | EXTENDED_RELEASE_TABLET | Freq: Two times a day (BID) | ORAL | 3 refills | Status: AC

## 2023-01-25 NOTE — Telephone Encounter (Signed)
The patient has been notified of the result and verbalized understanding.  All questions (if any) were answered. Bernestine Amass, RN 01/25/2023 9:35 AM

## 2023-01-25 NOTE — Telephone Encounter (Signed)
-----   Message from Vickie Epley, MD sent at 01/23/2023  7:35 PM EST ----- Labs reviewed. Potassium low.   Carly, have Ms Bazer increase the Kdur to 40 mEq daily. Thanks!  Lysbeth Galas T. Quentin Ore, MD, [facility], [facility] Cardiac Electrophysiology

## 2023-01-27 ENCOUNTER — Other Ambulatory Visit: Payer: Self-pay | Admitting: Physician Assistant

## 2023-01-28 ENCOUNTER — Telehealth (HOSPITAL_COMMUNITY): Payer: Self-pay | Admitting: *Deleted

## 2023-01-28 NOTE — Telephone Encounter (Signed)
Attempted to call patient regarding upcoming cardiac CT appointment. °Left message on voicemail with name and callback number ° °Xandrea Clarey RN Navigator Cardiac Imaging °[facility] °[phone] Office °[phone] Cell ° °

## 2023-01-31 ENCOUNTER — Other Ambulatory Visit: Payer: Self-pay

## 2023-01-31 ENCOUNTER — Ambulatory Visit (HOSPITAL_BASED_OUTPATIENT_CLINIC_OR_DEPARTMENT_OTHER)
Admission: RE | Admit: 2023-01-31 | Discharge: 2023-01-31 | Disposition: A | Discharge status: Home / Self Care | Payer: 59 | Source: Ambulatory Visit | Attending: Cardiology | Admitting: Cardiology

## 2023-01-31 DIAGNOSIS — I480 Paroxysmal atrial fibrillation: Secondary | ICD-10-CM | POA: Insufficient documentation

## 2023-01-31 DIAGNOSIS — I10 Essential (primary) hypertension: Secondary | ICD-10-CM | POA: Diagnosis not present

## 2023-01-31 DIAGNOSIS — E7849 Other hyperlipidemia: Secondary | ICD-10-CM

## 2023-01-31 MED ORDER — IOHEXOL 350 MG/ML SOLN
100.0000 mL | Freq: Once | INTRAVENOUS | Status: AC | PRN
  Administered 2023-01-31: 80 mL via INTRAVENOUS

## 2023-01-31 MED ORDER — ATORVASTATIN CALCIUM 10 MG PO TABS: 10.0000 mg | ORAL_TABLET | Freq: Every day | ORAL | 2 refills | Status: AC

## 2023-02-01 DIAGNOSIS — M47817 Spondylosis without myelopathy or radiculopathy, lumbosacral region: Secondary | ICD-10-CM | POA: Diagnosis not present

## 2023-02-02 ENCOUNTER — Ambulatory Visit (HOSPITAL_COMMUNITY): Payer: 59 | Attending: Interventional Cardiology

## 2023-02-02 DIAGNOSIS — I351 Nonrheumatic aortic (valve) insufficiency: Secondary | ICD-10-CM

## 2023-02-02 LAB — ECHOCARDIOGRAM COMPLETE
AR max vel: 1.7 cm2
AV Area VTI: 1.65 cm2
AV Area mean vel: 1.65 cm2
AV Mean grad: 9.8 mmHg
AV Peak grad: 16.3 mmHg
Ao pk vel: 2.02 m/s
Area-P 1/2: 3.03 cm2
P 1/2 time: 359 msec
S' Lateral: 3.7 cm

## 2023-02-04 NOTE — Pre-Procedure Instructions (Signed)
Instructed patient on the following items: Arrival time 0730 Nothing to eat or drink after midnight No meds AM of procedure Responsible person to drive you home and stay with you for 24 hrs  Have you missed any doses of anti-coagulant Eliquis- hasn't missed any doses, don't take any on Monday morning

## 2023-02-07 ENCOUNTER — Other Ambulatory Visit (HOSPITAL_COMMUNITY): Payer: Self-pay

## 2023-02-07 ENCOUNTER — Other Ambulatory Visit: Payer: Self-pay

## 2023-02-07 ENCOUNTER — Ambulatory Visit (HOSPITAL_COMMUNITY)
Admission: RE | Disposition: A | Discharge status: Home / Self Care | Payer: 59 | Source: Home / Self Care | Attending: Cardiology

## 2023-02-07 ENCOUNTER — Ambulatory Visit (HOSPITAL_BASED_OUTPATIENT_CLINIC_OR_DEPARTMENT_OTHER): Payer: 59 | Admitting: Registered Nurse

## 2023-02-07 ENCOUNTER — Ambulatory Visit (HOSPITAL_COMMUNITY)
Admission: RE | Admit: 2023-02-07 | Discharge: 2023-02-07 | Disposition: A | Discharge status: Home / Self Care | Payer: 59 | Attending: Cardiology | Admitting: Cardiology

## 2023-02-07 ENCOUNTER — Ambulatory Visit (HOSPITAL_COMMUNITY): Payer: 59 | Admitting: Registered Nurse

## 2023-02-07 DIAGNOSIS — I10 Essential (primary) hypertension: Secondary | ICD-10-CM | POA: Insufficient documentation

## 2023-02-07 DIAGNOSIS — E669 Obesity, unspecified: Secondary | ICD-10-CM | POA: Diagnosis not present

## 2023-02-07 DIAGNOSIS — I129 Hypertensive chronic kidney disease with stage 1 through stage 4 chronic kidney disease, or unspecified chronic kidney disease: Secondary | ICD-10-CM | POA: Diagnosis not present

## 2023-02-07 DIAGNOSIS — Z6827 Body mass index (BMI) 27.0-27.9, adult: Secondary | ICD-10-CM | POA: Diagnosis not present

## 2023-02-07 DIAGNOSIS — I480 Paroxysmal atrial fibrillation: Secondary | ICD-10-CM | POA: Insufficient documentation

## 2023-02-07 DIAGNOSIS — I4891 Unspecified atrial fibrillation: Secondary | ICD-10-CM

## 2023-02-07 DIAGNOSIS — Z7901 Long term (current) use of anticoagulants: Secondary | ICD-10-CM | POA: Insufficient documentation

## 2023-02-07 DIAGNOSIS — E785 Hyperlipidemia, unspecified: Secondary | ICD-10-CM | POA: Diagnosis not present

## 2023-02-07 DIAGNOSIS — Z87891 Personal history of nicotine dependence: Secondary | ICD-10-CM

## 2023-02-07 DIAGNOSIS — N189 Chronic kidney disease, unspecified: Secondary | ICD-10-CM

## 2023-02-07 DIAGNOSIS — Z8249 Family history of ischemic heart disease and other diseases of the circulatory system: Secondary | ICD-10-CM | POA: Insufficient documentation

## 2023-02-07 DIAGNOSIS — I4892 Unspecified atrial flutter: Secondary | ICD-10-CM | POA: Diagnosis not present

## 2023-02-07 HISTORY — PX: ATRIAL FIBRILLATION ABLATION: EP1191

## 2023-02-07 LAB — BASIC METABOLIC PANEL
Anion gap: 11 (ref 5–15)
BUN: 20 mg/dL (ref 8–23)
CO2: 29 mmol/L (ref 22–32)
Calcium: 9.9 mg/dL (ref 8.9–10.3)
Chloride: 99 mmol/L (ref 98–111)
Creatinine, Ser: 1.2 mg/dL — ABNORMAL HIGH (ref 0.44–1.00)
GFR, Estimated: 48 mL/min — ABNORMAL LOW (ref >60)
Glucose, Bld: 91 mg/dL (ref 70–99)
Potassium: 4.2 mmol/L (ref 3.5–5.1)
Sodium: 139 mmol/L (ref 135–145)

## 2023-02-07 LAB — POCT ACTIVATED CLOTTING TIME: Activated Clotting Time: 282 seconds

## 2023-02-07 SURGERY — ATRIAL FIBRILLATION ABLATION
Anesthesia: General

## 2023-02-07 MED ORDER — HEPARIN SODIUM (PORCINE) 1000 UNIT/ML IJ SOLN
INTRAMUSCULAR | Status: DC | PRN
  Administered 2023-02-07: 4000 [IU] via INTRAVENOUS
  Administered 2023-02-07: 12000 [IU] via INTRAVENOUS

## 2023-02-07 MED ORDER — ROCURONIUM BROMIDE 10 MG/ML (PF) SYRINGE
PREFILLED_SYRINGE | INTRAVENOUS | Status: DC | PRN
  Administered 2023-02-07: 50 mg via INTRAVENOUS

## 2023-02-07 MED ORDER — PROTAMINE SULFATE 10 MG/ML IV SOLN
INTRAVENOUS | Status: DC | PRN
  Administered 2023-02-07: 30 mg via INTRAVENOUS

## 2023-02-07 MED ORDER — COLCHICINE 0.6 MG PO TABS
0.6000 mg | ORAL_TABLET | Freq: Two times a day (BID) | ORAL | 0 refills | Status: DC
  Filled 2023-02-07: qty 10, 5d supply, fill #0

## 2023-02-07 MED ORDER — ACETAMINOPHEN 500 MG PO TABS
1000.0000 mg | ORAL_TABLET | Freq: Once | ORAL | Status: AC
  Administered 2023-02-07: 1000 mg via ORAL
  Filled 2023-02-07: qty 2

## 2023-02-07 MED ORDER — PANTOPRAZOLE SODIUM 40 MG PO TBEC
40.0000 mg | DELAYED_RELEASE_TABLET | Freq: Every day | ORAL | Status: DC
  Administered 2023-02-07: 40 mg via ORAL
  Filled 2023-02-07: qty 1

## 2023-02-07 MED ORDER — COLCHICINE 0.6 MG PO TABS
0.6000 mg | ORAL_TABLET | Freq: Two times a day (BID) | ORAL | Status: DC
  Administered 2023-02-07: 0.6 mg via ORAL
  Filled 2023-02-07 (×2): qty 1

## 2023-02-07 MED ORDER — SODIUM CHLORIDE 0.9 % IV SOLN: INTRAVENOUS | Status: DC

## 2023-02-07 MED ORDER — SUGAMMADEX SODIUM 200 MG/2ML IV SOLN
INTRAVENOUS | Status: DC | PRN
  Administered 2023-02-07: 200 mg via INTRAVENOUS

## 2023-02-07 MED ORDER — ONDANSETRON HCL 4 MG/2ML IJ SOLN: 4.0000 mg | Freq: Four times a day (QID) | INTRAMUSCULAR | Status: DC | PRN

## 2023-02-07 MED ORDER — PROPOFOL 10 MG/ML IV BOLUS
INTRAVENOUS | Status: DC | PRN
  Administered 2023-02-07: 25 ug/kg/min via INTRAVENOUS
  Administered 2023-02-07: 100 mg via INTRAVENOUS

## 2023-02-07 MED ORDER — ONDANSETRON HCL 4 MG/2ML IJ SOLN
INTRAMUSCULAR | Status: DC | PRN
  Administered 2023-02-07: 4 mg via INTRAVENOUS

## 2023-02-07 MED ORDER — HEPARIN SODIUM (PORCINE) 1000 UNIT/ML IJ SOLN
INTRAMUSCULAR | Status: DC | PRN
  Administered 2023-02-07: 1000 [IU] via INTRAVENOUS

## 2023-02-07 MED ORDER — SODIUM CHLORIDE 0.9% FLUSH: 3.0000 mL | INTRAVENOUS | Status: DC | PRN

## 2023-02-07 MED ORDER — MIDAZOLAM HCL 2 MG/2ML IJ SOLN
INTRAMUSCULAR | Status: DC | PRN
  Administered 2023-02-07: .5 mg via INTRAVENOUS

## 2023-02-07 MED ORDER — ACETAMINOPHEN 325 MG PO TABS: 650.0000 mg | ORAL_TABLET | ORAL | Status: DC | PRN

## 2023-02-07 MED ORDER — FENTANYL CITRATE (PF) 250 MCG/5ML IJ SOLN
INTRAMUSCULAR | Status: DC | PRN
  Administered 2023-02-07: 50 ug via INTRAVENOUS

## 2023-02-07 MED ORDER — MIDAZOLAM HCL 2 MG/2ML IJ SOLN
INTRAMUSCULAR | Status: AC
  Filled 2023-02-07: qty 2

## 2023-02-07 MED ORDER — SODIUM CHLORIDE 0.9 % IV SOLN: 250.0000 mL | INTRAVENOUS | Status: DC | PRN

## 2023-02-07 MED ORDER — SODIUM CHLORIDE 0.9% FLUSH: 3.0000 mL | Freq: Two times a day (BID) | INTRAVENOUS | Status: DC

## 2023-02-07 MED ORDER — APIXABAN 5 MG PO TABS
5.0000 mg | ORAL_TABLET | Freq: Two times a day (BID) | ORAL | Status: DC
  Administered 2023-02-07: 5 mg via ORAL
  Filled 2023-02-07: qty 1

## 2023-02-07 MED ORDER — HEPARIN (PORCINE) IN NACL 1000-0.9 UT/500ML-% IV SOLN
INTRAVENOUS | Status: DC | PRN
  Administered 2023-02-07 (×3): 500 mL

## 2023-02-07 MED ORDER — FENTANYL CITRATE (PF) 100 MCG/2ML IJ SOLN
INTRAMUSCULAR | Status: AC
  Filled 2023-02-07: qty 2

## 2023-02-07 MED ORDER — LIDOCAINE 2% (20 MG/ML) 5 ML SYRINGE
INTRAMUSCULAR | Status: DC | PRN
  Administered 2023-02-07: 80 mg via INTRAVENOUS

## 2023-02-07 MED ORDER — HEPARIN SODIUM (PORCINE) 1000 UNIT/ML IJ SOLN
INTRAMUSCULAR | Status: AC
  Filled 2023-02-07: qty 10

## 2023-02-07 SURGICAL SUPPLY — 18 items
BLANKET WARM UNDERBOD FULL ACC (MISCELLANEOUS) ×1 IMPLANT
CATH 8FR REPROCESSED SOUNDSTAR (CATHETERS) ×1 IMPLANT
CATH 8FR SOUNDSTAR REPROCESSED (CATHETERS) IMPLANT
CATH ABLAT QDOT MICRO BI TC DF (CATHETERS) IMPLANT
CATH OCTARAY 2.0 F 3-3-3-3-3 (CATHETERS) IMPLANT
CATH S-M CIRCA TEMP PROBE (CATHETERS) IMPLANT
CATH WEBSTER BI DIR CS D-F CRV (CATHETERS) IMPLANT
CLOSURE PERCLOSE PROSTYLE (VASCULAR PRODUCTS) IMPLANT
COVER SWIFTLINK CONNECTOR (BAG) ×1 IMPLANT
PACK EP LATEX FREE (CUSTOM PROCEDURE TRAY) ×1
PACK EP LF (CUSTOM PROCEDURE TRAY) ×1 IMPLANT
PAD DEFIB RADIO PHYSIO CONN (PAD) ×1 IMPLANT
PATCH CARTO3 (PAD) IMPLANT
SHEATH BAYLIS TRANSSEPTAL 98CM (NEEDLE) IMPLANT
SHEATH CARTO VIZIGO SM CVD (SHEATH) IMPLANT
SHEATH PINNACLE 8F 10CM (SHEATH) IMPLANT
SHEATH PINNACLE 9F 10CM (SHEATH) IMPLANT
TUBING SMART ABLATE COOLFLOW (TUBING) IMPLANT

## 2023-02-07 NOTE — Transfer of Care (Signed)
Immediate Anesthesia Transfer of Care Note  Patient: Chelsea [name]  Procedure(s) Performed: ATRIAL FIBRILLATION ABLATION  Patient Location: Cath Lab  Anesthesia Type:General  Level of Consciousness: awake, drowsy, and patient cooperative  Airway & Oxygen Therapy: Patient Spontanous Breathing and Patient connected to nasal cannula oxygen  Post-op Assessment: Report given to RN, Post -op Vital signs reviewed and stable, and Patient moving all extremities X 4  Post vital signs: Reviewed and stable  Last Vitals:  Vitals Value Taken Time  BP 141/72 02/07/23 1203  Temp    Pulse 71 02/07/23 1204  Resp 16 02/07/23 1204  SpO2 97 % 02/07/23 1204  Vitals shown include unvalidated device data.  Last Pain:  Vitals:   02/07/23 0903  TempSrc: Oral  PainSc: 0-No pain         Complications: There were no known notable events for this encounter.

## 2023-02-07 NOTE — Anesthesia Postprocedure Evaluation (Signed)
Anesthesia Post Note  Patient: Chelsea [name]  Procedure(s) Performed: ATRIAL FIBRILLATION ABLATION     Patient location during evaluation: PACU Anesthesia Type: General Level of consciousness: awake and alert Pain management: pain level controlled Vital Signs Assessment: post-procedure vital signs reviewed and stable Respiratory status: spontaneous breathing, nonlabored ventilation and respiratory function stable Cardiovascular status: stable and blood pressure returned to baseline Anesthetic complications: no   There were no known notable events for this encounter.  Last Vitals:  Vitals:   02/07/23 1220 02/07/23 1235  BP: (!) 149/73   Pulse: 72   Resp: (!) 22   Temp:  36.8 C  SpO2: 98%     Last Pain:  Vitals:   02/07/23 1250  TempSrc:   PainSc: 0-No pain                 Audry Pili

## 2023-02-07 NOTE — Discharge Instructions (Signed)

## 2023-02-07 NOTE — Anesthesia Procedure Notes (Signed)
Procedure Name: Intubation Date/Time: 02/07/2023 10:09 AM  Performed by: Ester Rink, CRNAPre-anesthesia Checklist: Patient identified, Emergency Drugs available, Suction available and Patient being monitored Patient Re-evaluated:Patient Re-evaluated prior to induction Oxygen Delivery Method: Circle system utilized Preoxygenation: Pre-oxygenation with 100% oxygen Induction Type: IV induction Ventilation: Mask ventilation without difficulty Laryngoscope Size: Mac and 3 Grade View: Grade I Tube type: Oral Tube size: 7.0 mm Number of attempts: 1 Airway Equipment and Method: Stylet and Oral airway Placement Confirmation: ETT inserted through vocal cords under direct vision, positive ETCO2 and breath sounds checked- equal and bilateral Secured at: 22 cm Tube secured with: Tape Dental Injury: Teeth and Oropharynx as per pre-operative assessment

## 2023-02-07 NOTE — Anesthesia Preprocedure Evaluation (Addendum)
Anesthesia Evaluation  Patient identified by MRN, date of birth, ID band Patient awake    Reviewed: Allergy & Precautions, NPO status , Patient's Chart, lab work & pertinent test results, reviewed documented beta blocker date and time   History of Anesthesia Complications Negative for: history of anesthetic complications  Airway Mallampati: II  TM Distance: >3 FB Neck ROM: Full    Dental  (+) Dental Advisory Given, Partial Upper, Partial Lower   Pulmonary Patient abstained from smoking., former smoker   Pulmonary exam normal        Cardiovascular hypertension, Pt. on medications and Pt. on home beta blockers + Valvular Problems/Murmurs AI and MR  Rhythm:Irregular Rate:Normal   '24 TTE - EF 55 to 60%. Grade I diastolic dysfunction (impaired relaxation). Left atrial size was mildly dilated. Mild to moderate mitral valve regurgitation. Aortic valve regurgitation is moderate. There is moderate dilatation of the ascending aorta, measuring 43 mm.      Neuro/Psych  Headaches PSYCHIATRIC DISORDERS Anxiety Depression     Hearing deficits b/l BPPV Thalamic pain syndrome Left-sided hemiparesis  CVA, Residual Symptoms    GI/Hepatic Neg liver ROS, hiatal hernia,GERD  Medicated and Controlled,,  Endo/Other  negative endocrine ROS    Renal/GU CRFRenal disease Bladder dysfunction Female GU complaint     Musculoskeletal  (+) Arthritis ,    Abdominal   Peds  Hematology  On eliquis    Anesthesia Other Findings   Reproductive/Obstetrics                             Anesthesia Physical Anesthesia Plan  ASA: 3  Anesthesia Plan: General   Post-op Pain Management: Tylenol PO (pre-op)*   Induction: Intravenous  PONV Risk Score and Plan: 3 and Treatment may vary due to age or medical condition, Ondansetron and Dexamethasone  Airway Management Planned: Oral ETT  Additional Equipment:  None  Intra-op Plan:   Post-operative Plan: Extubation in OR  Informed Consent: I have reviewed the patients History and Physical, chart, labs and discussed the procedure including the risks, benefits and alternatives for the proposed anesthesia with the patient or authorized representative who has indicated his/her understanding and acceptance.     Dental advisory given  Plan Discussed with: CRNA and Anesthesiologist  Anesthesia Plan Comments:        Anesthesia Quick Evaluation

## 2023-02-07 NOTE — Progress Notes (Signed)
Pt ambulated to and from bathroom to void with no oozing from bilateral groin sites

## 2023-02-07 NOTE — H&P (Signed)
Electrophysiology Office Note:     Date:  02/07/2023    ID:  Chelsea [name], DOB [birth year], MRN [id number]   PCP:  Trey Sailors, [place]       [facility] Cardiologist:  Sinclair Grooms, MD  [place] Electrophysiologist:  Vickie Epley, MD    Referring MD: Belva Crome, MD    Chief Complaint: AF   History of Present Illness:     Chelsea [name] is a 72 y.o. female who presents for an evaluation of AF at the request of Dr Tamala Julian. Their medical history includes AI, stroke, HLD, HTN, obesity. Previous AF burden was 14%.    She last saw Dr Tamala Julian 08/30/2022. She is referred to discuss possible catheter ablation. She takes eliquis for stroke ppx. She is active. Able to do all ADLs, shops, etc. Does feel fatigued when in AF.   Presents for PVI today.     Objective      Past Medical History:  Diagnosis Date   Anxiety     Aortic aneurysm without rupture [facility])      followed by dr dr h. Tamala Julian,  per echo 08/ 2021 73m and chest CT 03/ 2021 4.0.cm   Arthritis      lower back   BPPV (benign paroxysmal positional vertigo)     Chronic back pain     CKD (chronic kidney disease), stage II     Depression     GERD (gastroesophageal reflux disease)     Headache     Hemiparesis affecting left side as late [place]     Hiatal hernia     History of anemia     History of cerebrovascular accident (CVA) with residual deficit 03/2006    (neurologist--- dr aRexene Alberts admission in epic, acute right thalamic hypertensive hemorrhage w/ left sided hemiparesis   History of gastric ulcer 2003    w/ upper gi bleed,  egd with no intervention needed   Hyperlipidemia     Hypertension      followed by cardiology--- [facility]   (nuclear stress test 01-31-20121 normal w/ ef 54%; cardaic cath 08-07-2010 no sig coronary obstruction, preserved LVF, mild AR   Multinodular goiter      followed by pcp,  last ultrasound in epic 03-20-2020 , never  met critiria for bx   Nonrheumatic aortic (valve) insufficiency      followed by cardiologist--- dr h. 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,  followed by dr atRexene Alberts Urge incontinence     Wears glasses     Wears hearing aid in both ears     Wears partial dentures      upper and lower           Past Surgical History:  Procedure Laterality Date   ABDOMINAL HYSTERECTOMY       CARDIAC CATHETERIZATION   08/07/2010    [facility]  no sig coronary obstruction, preserved LVF, mild AR   COLON RESECTION SIGMOID N/A 11/08/2021    Procedure: COLON RESECTION SIGMOID; DRAINAGE OF INTRAABDOMINAL ABSCESS;  Surgeon: Georganna Skeans, MD;  Location: [place];  Service: General;  Laterality: N/A;   COLOSTOMY N/A 11/08/2021    Procedure: COLOSTOMY;  Surgeon: Georganna Skeans, MD;  Location: [place];  Service: General;  Laterality: N/A;   COLOSTOMY REVERSAL N/A 05/17/2022    Procedure: COLOSTOMY REVERSAL ([facility]);  Surgeon: Georganna Skeans, MD;  Location: [place];  Service: General;  Laterality: N/A;   MASS EXCISION N/A 06/04/2021    Procedure: EXCISION SUCUTANEOUS MASS X2, UPPER BACK;  Surgeon: Clovis Riley, MD;  Location: [place];  Service: General;  Laterality: N/A;   TOTAL KNEE ARTHROPLASTY Left 01/05/2016    Procedure: TOTAL KNEE ARTHROPLASTY;  Surgeon: Frederik Pear, MD;  Location: [place];  Service: Orthopedics;  Laterality: Left;   TUBAL LIGATION Bilateral 2000      Current Medications: Active Medications      Current Meds  Medication Sig   amLODipine-valsartan (EXFORGE) 10-320 MG tablet Take 1 tablet by mouth daily.    apixaban (ELIQUIS) 5 MG TABS tablet Take 1 tablet (5 mg total) by mouth 2 (two) times daily.   Ascorbic Acid (VITAMIN C) 1000 MG tablet Take 1,000 mg by mouth in the morning.   atorvastatin (LIPITOR) 10 MG tablet TAKE 1 TABLET BY MOUTH DAILY   b complex vitamins capsule Take 1 capsule by mouth in the morning.   B Complex-C (B-COMPLEX WITH VITAMIN C) tablet Take 1 tablet by mouth in the morning.   Biotin 5000 MCG TABS Take 5,000 mcg by mouth in the morning.   chlorhexidine (PERIDEX) 0.12 % solution Use as directed 15 mLs in the mouth or throat daily.   cholecalciferol (VITAMIN D) 25 MCG (1000 UNIT) tablet Take 1,000 Units by mouth daily as needed (with vertigo).   citalopram (CELEXA) 20 MG tablet Take 20 mg by mouth in the morning and at bedtime.   cloNIDine (CATAPRES) 0.3 MG tablet Take 0.3 mg by mouth 2 (two) times daily.   ezetimibe (ZETIA) 10 MG tablet Take 10 mg by mouth 2 (two) times a week.   Ferrous Gluconate (IRON 27 PO) Take by mouth in the morning.   gabapentin (NEURONTIN) 800 MG tablet Take 1 tablet (800 mg total) by mouth 2 (two) times daily. (Patient taking differently: Take 400 mg by mouth 2 (two) times daily as needed (nerve pain.).)   hydrALAZINE (APRESOLINE) 25 MG tablet Take 1 tablet (25 mg total) by mouth 3 (three) times daily.   losartan (COZAAR) 50 MG tablet Take 1 tablet (50 mg total) by mouth daily.   meclizine (ANTIVERT) 25 MG tablet Take 25 mg by mouth 3 (three) times daily as needed (vertigo).   methocarbamol (ROBAXIN) 750 MG tablet Take 1 tablet (750 mg total) by mouth 3 (three) times daily. (Patient taking differently: Take 750 mg by mouth every 8 (eight) hours as needed for muscle spasms.)   metoprolol succinate (TOPROL-XL) 100 MG 24 hr tablet Take 1 tablet (100 mg total) by mouth daily. Take with or immediately following a meal.   mirabegron ER (MYRBETRIQ) 25 MG TB24 tablet Take 25 mg by mouth in the morning.   Multiple Vitamin (MULTIVITAMIN WITH MINERALS) TABS tablet  Take 1 tablet by mouth in the morning.   nicotine (NICODERM CQ - DOSED IN MG/24 HOURS) 21 mg/24hr patch PLACE 1 PATCH (21 MG TOTAL) ONTO THE SKIN DAILY.  ondansetron (ZOFRAN) 8 MG tablet Take 8 mg by mouth 3 (three) times daily as needed for nausea or vomiting.   pantoprazole (PROTONIX) 40 MG tablet Take 40 mg by mouth daily before breakfast.   potassium chloride SA (KLOR-CON M) 20 MEQ tablet Take 1 tablet (20 mEq total) by mouth daily.   spironolactone (ALDACTONE) 25 MG tablet TAKE 1 TABLET (25 MG TOTAL) BY MOUTH DAILY.   traMADol (ULTRAM) 50 MG tablet Take 1 tablet (50 mg total) by mouth every 6 (six) hours as needed for severe pain.   Vitamin A 2400 MCG (8000 UT) CAPS Take 2,400 mcg by mouth in the morning.   vitamin B-12 (CYANOCOBALAMIN) 1000 MCG tablet Take 1,000 mcg by mouth in the morning.   vitamin E 180 MG (400 UNITS) capsule Take 400 Units by mouth in the morning.        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  Tobacco Use   Smoking status: Former      Packs/day: 1.00      Years: 50.00      Total pack years: 50.00      Types: Cigarettes      Quit date: 10/29/2021      Years since quitting: 0.9   Smokeless tobacco: Never  Vaping Use   Vaping Use: Never used  Substance and Sexual Activity   Alcohol use: No      Alcohol/week: 0.0 standard drinks of alcohol   Drug use: No   Sexual activity: Never  Other Topics Concern   Not on file  Social History Narrative    Patient consumes no caffeine    Social Determinants of Health    Financial Resource Strain: Not on file  Food Insecurity: Not on file  Transportation Needs: Not on file  Physical Activity:  Not on file  Stress: Not on file  Social Connections: Not on file      Family History: The patient's family history includes Cancer in her brother; Cirrhosis in her father; Drug abuse in her brother; Heart attack in an other family member; Hypertension in her mother; Peripheral vascular disease in her mother. There is no history of Breast cancer or Stroke.   ROS:   Please see the history of present illness.    All other systems reviewed and are negative.   EKGs/Labs/Other Studies Reviewed:     The following studies were reviewed today:   06/2022 echo - EF 50, LV size normal, RV normal, mildly dilated atria, trivial MR, moderate AI   08/27/2022 zio   NSR ave HR 88 bpm   Two non-sustained VT episodes with longest 11 beats and fastest 255 bpm   24 SVT episodes, longest 12 beats, fastest 193 bpm   Atrial Fibrillation burden 14% with ave HR 104 bpm   PVC burden 1%   PAC burden 2%       Recent Labs: 11/13/2021: Magnesium 2.5; TSH 1.934 04/16/2022: ALT 12 09/01/2022: Hemoglobin 14.3; Platelet Count 319 10/07/2022: BUN 17; Creatinine, Ser 1.16; Potassium 3.7; Sodium 141  Recent Lipid Panel Labs (Brief)  Component Value Date/Time    CHOL 134 04/20/2016 1409    TRIG 99 04/20/2016 1409    HDL 54 04/20/2016 1409    CHOLHDL 2.5 04/20/2016 1409    VLDL 20 04/20/2016 1409    LDLCALC 60 04/20/2016 1409        Physical Exam:     VS:  BP 161/87   Pulse 75   Ht '5\' 6"'$  (1.676 m)   Wt 173 lb (78.5 kg)   SpO2 97%   BMI 27.92 kg/m         Wt Readings from Last 3 Encounters:  10/27/22 173 lb (78.5 kg)  09/06/22 174 lb 4.8 oz (79.1 kg)  08/30/22 168 lb 12.8 oz (76.6 kg)      GEN:  Well nourished, well developed in no acute distress HEENT: Normal NECK: No JVD; No carotid bruits LYMPHATICS: No lymphadenopathy CARDIAC: RRR, no murmurs, rubs, gallops RESPIRATORY:  Clear to auscultation without rales, wheezing or rhonchi  ABDOMEN: Soft, non-tender,  non-distended MUSCULOSKELETAL:  No edema; No deformity  SKIN: Warm and dry NEUROLOGIC:  Alert and oriented x 3 PSYCHIATRIC:  Normal affect          Assessment ASSESSMENT:     1. Paroxysmal atrial fibrillation (HCC)   2. Primary hypertension     PLAN:     In order of problems listed above:     #AF Symptomatic.    Discussed treatment options today for their AF including antiarrhythmic drug therapy and ablation. Discussed risks, recovery and likelihood of success. Discussed potential need for repeat ablation procedures and antiarrhythmic drugs after an initial ablation. They wish to proceed with scheduling.   Risk, benefits, and alternatives to EP study and radiofrequency ablation for afib were also discussed in detail today. These risks include but are not limited to stroke, bleeding, vascular damage, tamponade, perforation, damage to the esophagus, lungs, and other structures, pulmonary vein stenosis, worsening renal function, and death. The patient understands these risk and wishes to proceed.  We will therefore proceed with catheter ablation at the next available time.  Carto, ICE, anesthesia are requested for the procedure.  Will also obtain CT PV protocol prior to the procedure to exclude LAA thrombus and further evaluate atrial anatomy.    Presents for PVI today. Procedure reviewed.     Signed, Hilton Cork. Quentin Ore, MD, [facility], [facility] 02/07/2023 Electrophysiology [facility]

## 2023-02-08 ENCOUNTER — Encounter (HOSPITAL_COMMUNITY): Payer: Self-pay | Admitting: Cardiology

## 2023-02-08 MED FILL — Fentanyl Citrate Preservative Free (PF) Inj 100 MCG/2ML: INTRAMUSCULAR | Qty: 1 | Status: CN

## 2023-02-08 MED FILL — Verapamil HCl IV Soln 2.5 MG/ML: INTRAVENOUS | Qty: 2 | Status: CN

## 2023-02-08 MED FILL — Fentanyl Citrate Preservative Free (PF) Inj 100 MCG/2ML: INTRAMUSCULAR | Qty: 1 | Status: AC

## 2023-02-22 ENCOUNTER — Ambulatory Visit: Payer: 59 | Attending: Internal Medicine | Admitting: Internal Medicine

## 2023-02-22 ENCOUNTER — Encounter: Payer: Self-pay | Admitting: Internal Medicine

## 2023-02-22 VITALS — BP 124/64 | HR 53 | Ht 66.0 in | Wt 184.0 lb

## 2023-02-22 DIAGNOSIS — I480 Paroxysmal atrial fibrillation: Secondary | ICD-10-CM | POA: Diagnosis not present

## 2023-02-22 DIAGNOSIS — I10 Essential (primary) hypertension: Secondary | ICD-10-CM

## 2023-02-22 DIAGNOSIS — I719 Aortic aneurysm of unspecified site, without rupture: Secondary | ICD-10-CM

## 2023-02-22 DIAGNOSIS — I351 Nonrheumatic aortic (valve) insufficiency: Secondary | ICD-10-CM | POA: Diagnosis not present

## 2023-02-22 NOTE — Patient Instructions (Signed)
Medication Instructions:  Your physician recommends that you continue on your current medications as directed. Please refer to the Current Medication list given to you today.  *If you need a refill on your cardiac medications before your next appointment, please call your pharmacy*   Lab Work: NONE If you have labs (blood work) drawn today and your tests are completely normal, you will receive your results only by: [place] (if you have MyChart) OR A paper copy in the mail If you have any lab test that is abnormal or we need to change your treatment, we will call you to review the results.   Testing/Procedures: MARCH 2025: Your physician has requested that you have an echocardiogram. Echocardiography is a painless test that uses sound waves to create images of your heart. It provides your doctor with information about the size and shape of your heart and how well your heart's chambers and valves are working. This procedure takes approximately one hour. There are no restrictions for this procedure. Please do NOT wear cologne, perfume, aftershave, or lotions (deodorant is allowed). Please arrive 15 minutes prior to your appointment time.  MARCH 2025: Your physician has requested that you have a CT Aorta.   Follow-Up: At [facility], you and your health needs are our priority.  As part of our continuing mission to provide you with exceptional heart care, we have created designated Provider Care Teams.  These Care Teams include your primary Cardiologist (physician) and Advanced Practice Providers (APPs -  Physician Assistants and Nurse Practitioners) who all work together to provide you with the care you need, when you need it.    Your next appointment:   1 year(s)  Provider:   Werner Lean, MD

## 2023-02-22 NOTE — Progress Notes (Signed)
Cardiology Office Note:    Date:  02/22/2023   ID:  Chelsea [name], DOB 1951/05/10, MRN [id number]  PCP:  Trey Sailors, PA  Cardiologist:  Werner Lean, MD   Referring MD: Trey Sailors, PA   CC: Transition to new cardiologist  History of Present Illness:    Chelsea [name] is a 72 y.o. female with a hx of moderate aortic regurgitation, mild MR, aortic dilation; never saw Dr. Cyndia Bent  non-cardiac chest pain, PAF seen by Dr. Quentin Ore prior history of stroke with residual left-sided weakness, hyperlipidemia, hypertension last seen by Dr. Tamala Julian in 2023.  Patient notes that she is doing well.   Had an AF ablation with no issues.   No chest pain or pressure .  No SOB/DOE and no PND/Orthopnea.  No weight gain or leg swelling.  No palpitations or syncope .   Past Medical History:  Diagnosis Date   Anxiety    Aortic aneurysm without rupture [facility])    followed by dr dr h. 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 Tamala Julian and HTN clinic   (nuclear stress test 01-31-20121 normal w/ ef 54%; cardaic cath 08-07-2010 no sig coronary obstruction, preserved LVF, mild AR   Multinodular goiter    followed by pcp,  last ultrasound in epic 03-20-2020 , never met critiria for bx    Nonrheumatic aortic (valve) insufficiency    followed by cardiologist--- dr h. 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,  followed by dr Rexene Alberts   Urge incontinence    Wears glasses    Wears hearing aid in both ears    Wears partial dentures    upper and lower    Past Surgical History:  Procedure Laterality Date   ABDOMINAL HYSTERECTOMY     ATRIAL FIBRILLATION ABLATION N/A 02/07/2023   Procedure: ATRIAL FIBRILLATION ABLATION;  Surgeon: Vickie Epley, MD;  Location: [place] CV LAB;  Service: Cardiovascular;  Laterality: N/A;   CARDIAC CATHETERIZATION  08/07/2010   @ [facility]  dr Lia Foyer---  no sig coronary obstruction, preserved LVF, mild AR   COLON RESECTION SIGMOID N/A 11/08/2021   Procedure: COLON RESECTION SIGMOID; DRAINAGE OF INTRAABDOMINAL ABSCESS;  Surgeon: Georganna Skeans, MD;  Location: [place];  Service: General;  Laterality: N/A;   COLOSTOMY N/A 11/08/2021   Procedure: COLOSTOMY;  Surgeon: Georganna Skeans, MD;  Location: [place];  Service: General;  Laterality: N/A;   COLOSTOMY REVERSAL N/A 05/17/2022   Procedure: COLOSTOMY REVERSAL ([place]);  Surgeon: Georganna Skeans, MD;  Location: [place];  Service: General;  Laterality: N/A;   MASS EXCISION N/A 06/04/2021   Procedure: EXCISION SUCUTANEOUS MASS X2, UPPER BACK;  Surgeon: Clovis Riley, MD;  Location: [place];  Service: General;  Laterality: N/A;   TOTAL KNEE ARTHROPLASTY Left 01/05/2016   Procedure: TOTAL KNEE ARTHROPLASTY;  Surgeon: Frederik Pear, MD;  Location: [place];  Service: Orthopedics;  Laterality: Left;   TUBAL LIGATION Bilateral 2000    Current Medications: Current  Meds  Medication Sig   amLODipine (NORVASC) 10 MG tablet Take 1 tablet (10 mg total) by mouth daily.   apixaban (ELIQUIS) 5 MG TABS tablet Take 1 tablet (5 mg total) by mouth 2 (two) times daily.   Ascorbic Acid (VITAMIN C) 1000 MG tablet Take 1,000 mg by mouth in the morning.   atorvastatin (LIPITOR) 10 MG tablet Take 1 tablet (10 mg total) by mouth daily.   b complex vitamins capsule Take 1 capsule by mouth in the morning.   Biotin 5000 MCG TABS Take 5,000 mcg by mouth in the morning.   cholecalciferol (VITAMIN D) 25 MCG (1000 UNIT) tablet Take 1,000 Units by mouth daily as needed (with vertigo).   cloNIDine (CATAPRES) 0.3 MG tablet Take 0.3 mg by mouth 2 (two) times daily.   colchicine 0.6 MG tablet Take 1 tablet (0.6 mg total) by mouth 2 (two) times daily for 5 days.   cyclobenzaprine (FLEXERIL) 10 MG tablet Take 10 mg by mouth as needed for muscle spasms.   gabapentin (NEURONTIN) 800 MG tablet Take 1 tablet (800 mg total) by mouth 2 (two) times daily. (Patient taking differently: Take 400 mg by mouth 2 (two) times daily.)   hydrALAZINE (APRESOLINE) 25 MG tablet TAKE 1 TABLET (25 MG TOTAL) BY MOUTH 3 (THREE) TIMES DAILY.   indapamide (LOZOL) 2.5 MG tablet TAKE 1 TABLET (2.5 MG TOTAL) BY MOUTH DAILY.   losartan (COZAAR) 50 MG tablet Take 1 tablet (50 mg total) by mouth daily.   meclizine (ANTIVERT) 25 MG tablet Take 25 mg by mouth 3 (three) times daily as needed (vertigo).   metoprolol succinate (TOPROL-XL) 100 MG 24 hr tablet Take 1.5 tablets (150 mg total) by mouth daily. Take with or immediately following a meal.   Multiple Vitamin (MULTIVITAMIN WITH MINERALS) TABS tablet Take 1 tablet by mouth in the morning. 50+   ondansetron (ZOFRAN) 8 MG tablet Take 8 mg by mouth 3 (three) times daily as needed for nausea or vomiting.   pantoprazole (PROTONIX) 40 MG tablet Take 40 mg by mouth daily before breakfast.   potassium chloride SA (KLOR-CON M20) 20 MEQ tablet Take 1 tablet (20 mEq total) by  mouth 2 (two) times daily. (Patient taking differently: Take 20 mEq by mouth daily.)   spironolactone (ALDACTONE) 25 MG tablet TAKE 1 TABLET (25 MG TOTAL) BY MOUTH DAILY.   traMADol (ULTRAM) 50 MG tablet Take 1 tablet (50 mg total) by mouth every 6 (six) hours as needed for severe pain.   Vitamin A 2400 MCG (8000 UT) CAPS Take 2,400 mcg by mouth in the morning.   vitamin B-12 (CYANOCOBALAMIN) 1000 MCG tablet Take 1,000 mcg by mouth 2 (two) times a week.   vitamin E 180 MG (400 UNITS)  capsule Take 400 Units by mouth in the morning.     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  Tobacco Use   Smoking status: Former    Packs/day: 1.00    Years: 50.00    Additional pack years: 0.00    Total pack years: 50.00    Types: Cigarettes    Quit date: 10/29/2021    Years since quitting: 1.3   Smokeless tobacco: Never  Vaping Use   Vaping Use: Never used  Substance and Sexual Activity   Alcohol use: No    Alcohol/week: 0.0 standard drinks of alcohol   Drug use: No   Sexual activity: Never  Other Topics Concern   Not on file  Social History Narrative   Patient consumes no caffeine   Social Determinants of Health   Financial Resource Strain: Not on file  Food Insecurity: Not on file  Transportation Needs: Not on file  Physical Activity: Not on file  Stress: Not on file  Social Connections: Not on file     Family History: The patient's family history includes Cancer in her brother; Cirrhosis in her father; Drug abuse in her brother; Heart attack in an other family member; Hypertension in her mother; Peripheral vascular disease in her mother. There is no history of Breast cancer or  Stroke.  ROS:   Please see the history of present illness.    No other complaints.  No bleeding complaints.  All other systems reviewed and are negative.  EKGs/Labs/Other Studies Reviewed:    The following studies were reviewed today:  EKG:   02/22/2023: Sinus bradycardia with LVH  Cardiac Studies & Procedures       ECHOCARDIOGRAM  ECHOCARDIOGRAM COMPLETE 02/02/2023  Narrative ECHOCARDIOGRAM REPORT    Patient Name:   PERSEUS TAKALA [place] Date of Exam: 02/02/2023 Medical Rec #:  [id number]           Height:       66.0 in Accession #:    [id number]          Weight:       173.0 lb Date of Birth:  [birth year]            BSA:          1.880 m Patient Age:    37 years            BP:           130/76 mmHg Patient Gender: F                   HR:           78 bpm. Exam Location:  [place]  Procedure: 2D Echo, Cardiac Doppler and Color Doppler  Indications:    I35.1 Aortic Insufficiency  History:        Patient has prior history of Echocardiogram examinations, most recent 07/14/2022. Stroke; Risk Factors:Hypertension and Dyslipidemia.  Sonographer:    Cresenciano Lick RDCS Referring Phys: [place]   1. Left ventricular ejection fraction, by estimation, is 55 to 60%. The left ventricle has normal function. The left ventricle has no  regional wall motion abnormalities. Left ventricular diastolic parameters are consistent with Grade I diastolic dysfunction (impaired relaxation). 2. Right ventricular systolic function is normal. The right ventricular size is normal. 3. Left atrial size was mildly dilated. 4. The mitral valve is normal in structure. Mild to moderate mitral valve regurgitation. 5. The aortic valve is tricuspid. Aortic valve regurgitation is moderate. Aortic valve sclerosis/calcification is present, without any evidence of aortic stenosis. 6. Aortic dilatation noted. There is moderate dilatation of the ascending aorta, measuring 43 mm. 7.  The inferior vena cava is normal in size with greater than 50% respiratory variability, suggesting right atrial pressure of 3 mmHg.  Comparison(s): The left ventricular function is unchanged.  FINDINGS Left Ventricle: Left ventricular ejection fraction, by estimation, is 55 to 60%. The left ventricle has normal function. The left ventricle has no regional wall motion abnormalities. The left ventricular internal cavity size was normal in size. There is no left ventricular hypertrophy. Left ventricular diastolic parameters are consistent with Grade I diastolic dysfunction (impaired relaxation).  Right Ventricle: The right ventricular size is normal. Right vetricular wall thickness was not assessed. Right ventricular systolic function is normal.  Left Atrium: Left atrial size was mildly dilated.  Right Atrium: Right atrial size was normal in size.  Pericardium: There is no evidence of pericardial effusion.  Mitral Valve: The mitral valve is normal in structure. Mild to moderate mitral valve regurgitation.  Tricuspid Valve: The tricuspid valve is normal in structure. Tricuspid valve regurgitation is mild.  Aortic Valve: The aortic valve is tricuspid. Aortic valve regurgitation is moderate. Aortic regurgitation PHT measures 359 msec. Aortic valve sclerosis/calcification is present, without any evidence of aortic stenosis. Aortic valve mean gradient measures 9.8 mmHg. Aortic valve peak gradient measures 16.3 mmHg. Aortic valve area, by VTI measures 1.65 cm.  Pulmonic Valve: The pulmonic valve was normal in structure. Pulmonic valve regurgitation is not visualized.  Aorta: The aortic root is normal in size and structure and aortic dilatation noted. There is moderate dilatation of the ascending aorta, measuring 43 mm.  Venous: The inferior vena cava is normal in size with greater than 50% respiratory variability, suggesting right atrial pressure of 3 mmHg.  IAS/Shunts: No atrial level shunt  detected by color flow Doppler.   LEFT VENTRICLE PLAX 2D LVIDd:         5.10 cm   Diastology LVIDs:         3.70 cm   LV e' medial:    3.65 cm/s LV PW:         0.70 cm   LV E/e' medial:  16.3 LV IVS:        0.70 cm   LV e' lateral:   5.92 cm/s LVOT diam:     2.00 cm   LV E/e' lateral: 10.1 LV SV:         72 LV SV Index:   38 LVOT Area:     3.14 cm   RIGHT VENTRICLE             IVC RV Basal diam:  3.10 cm     IVC diam: 0.90 cm RV S prime:     13.50 cm/s TAPSE (M-mode): 2.4 cm  LEFT ATRIUM             Index        RIGHT ATRIUM           Index LA diam:        4.50 cm 2.39 cm/m  RA Area:     16.10 cm LA Vol (A2C):   59.1 ml 31.43 ml/m  RA Volume:   44.00 ml  23.40 ml/m LA Vol (A4C):   62.4 ml 33.19 ml/m LA Biplane Vol: 60.9 ml 32.39 ml/m AORTIC VALVE AV Area (Vmax):    1.70 cm AV Area (Vmean):   1.65 cm AV Area (VTI):     1.65 cm AV Vmax:           201.80 cm/s AV Vmean:          146.600 cm/s AV VTI:            0.436 m AV Peak Grad:      16.3 mmHg AV Mean Grad:      9.8 mmHg LVOT Vmax:         109.33 cm/s LVOT Vmean:        77.067 cm/s LVOT VTI:          0.230 m LVOT/AV VTI ratio: 0.53 AI PHT:            359 msec  AORTA Ao Root diam: 2.70 cm Ao Asc diam:  4.30 cm  MITRAL VALVE                TRICUSPID VALVE MV Area (PHT): 3.03 cm     TR Peak grad:   21.9 mmHg MV Decel Time: 250 msec     TR Vmax:        234.00 cm/s MV E velocity: 59.50 cm/s MV A velocity: 104.00 cm/s  SHUNTS MV E/A ratio:  0.57         Systemic VTI:  0.23 m Systemic Diam: 2.00 cm  Dorris Carnes MD Electronically signed by Dorris Carnes MD Signature Date/Time: 02/02/2023/10:02:00 PM    Final    MONITORS  CARDIAC EVENT MONITOR 09/10/2022            Recent Labs: 04/16/2022: ALT 12 01/17/2023: Hemoglobin 13.4; Platelets 313 02/07/2023: BUN 20; Creatinine, Ser 1.20; Potassium 4.2; Sodium 139  Recent Lipid Panel    Component Value Date/Time   CHOL 134 04/20/2016 1409   TRIG 99  04/20/2016 1409   HDL 54 04/20/2016 1409   CHOLHDL 2.5 04/20/2016 1409   VLDL 20 04/20/2016 1409   LDLCALC 60 04/20/2016 1409    Physical Exam:    VS:  BP 124/64   Pulse (!) 53   Ht 5\' 6"  (1.676 m)   Wt 184 lb (83.5 kg)   SpO2 97%   BMI 29.70 kg/m     Wt Readings from Last 3 Encounters:  02/22/23 184 lb (83.5 kg)  02/07/23 169 lb (76.7 kg)  10/27/22 173 lb (78.5 kg)     GEN:  No acute distress HEENT: Normal NECK: No JVD. CARDIAC: RRR, diastolic murmur VASCULAR:  Normal Pulses.  RESPIRATORY:  Clear to auscultation without rales, wheezing or rhonchi  MUSCULOSKELETAL: No deformity  SKIN: Warm and dry NEUROLOGIC:  Alert and oriented x 3 PSYCHIATRIC:  Normal affect   ASSESSMENT:    1. Paroxysmal atrial fibrillation (HCC)   2. Aortic aneurysm without rupture, unspecified portion of aorta ([place])   3. Nonrheumatic aortic valve insufficiency     PLAN:    Moderate AI Mild to moderate MR Mild aortic dilation 44 mm - Stable disease with no symptoms; March 2025 Echo and CT-Aorta- if worsening dimensions will resend to [facility]  HTN - Atypical BP regimen (hyrdalazine & Indapamide) started by Dr. Tamala Julian; BP controlled will not change -  continue MRA and ARB  PAF Acquired thrombophilia - seen by Dr. Quentin Ore (please see prior study) - s/p ablation on eliquis - currently in SR; continue BB  HLD with prior stroke - continue statin and zetia     CHA2DS2-VASc Score = 7 {The patient's score is based upon: CHF History: 1 HTN History: 1 Stroke History: 2 Vascular Disease History: 1 Age Score: 1 Gender Score: 1       One year with me  Medication Adjustments/Labs and Tests Ordered: Current medicines are reviewed at length with the patient today.  Concerns regarding medicines are outlined above.  Orders Placed This Encounter  Procedures   CT ANGIO CHEST AORTA W/CM & OR WO/CM   EKG 12-Lead   ECHOCARDIOGRAM COMPLETE   No orders of the defined types were placed in this  encounter.   Patient Instructions  Medication Instructions:  Your physician recommends that you continue on your current medications as directed. Please refer to the Current Medication list given to you today.  *If you need a refill on your cardiac medications before your next appointment, please call your pharmacy*   Lab Work: NONE If you have labs (blood work) drawn today and your tests are completely normal, you will receive your results only by: [place] (if you have MyChart) OR A paper copy in the mail If you have any lab test that is abnormal or we need to change your treatment, we will call you to review the results.   Testing/Procedures: MARCH 2025: Your physician has requested that you have an echocardiogram. Echocardiography is a painless test that uses sound waves to create images of your heart. It provides your doctor with information about the size and shape of your heart and how well your heart's chambers and valves are working. This procedure takes approximately one hour. There are no restrictions for this procedure. Please do NOT wear cologne, perfume, aftershave, or lotions (deodorant is allowed). Please arrive 15 minutes prior to your appointment time.  MARCH 2025: Your physician has requested that you have a CT Aorta.   Follow-Up: At [facility], you and your health needs are our priority.  As part of our continuing mission to provide you with exceptional heart care, we have created designated Provider Care Teams.  These Care Teams include your primary Cardiologist (physician) and Advanced Practice Providers (APPs -  Physician Assistants and Nurse Practitioners) who all work together to provide you with the care you need, when you need it.    Your next appointment:   1 year(s)  Provider:   Werner Lean, MD        Signed, Werner Lean, MD  02/22/2023 2:15 PM    [place]

## 2023-02-22 NOTE — Addendum Note (Signed)
Addended by: Precious Gilding on: 02/22/2023 02:31 PM   Modules accepted: Orders

## 2023-02-24 ENCOUNTER — Ambulatory Visit: Payer: 59

## 2023-03-07 ENCOUNTER — Encounter (HOSPITAL_COMMUNITY): Payer: Self-pay | Admitting: Physician Assistant

## 2023-03-07 ENCOUNTER — Ambulatory Visit (HOSPITAL_COMMUNITY)
Admission: RE | Admit: 2023-03-07 | Discharge: 2023-03-07 | Disposition: A | Discharge status: Home / Self Care | Payer: 59 | Source: Ambulatory Visit | Attending: Physician Assistant | Admitting: Physician Assistant

## 2023-03-07 VITALS — BP 132/88 | HR 75 | Ht 66.0 in | Wt 182.6 lb

## 2023-03-07 DIAGNOSIS — I480 Paroxysmal atrial fibrillation: Secondary | ICD-10-CM | POA: Diagnosis not present

## 2023-03-07 DIAGNOSIS — I10 Essential (primary) hypertension: Secondary | ICD-10-CM | POA: Diagnosis not present

## 2023-03-07 DIAGNOSIS — Z7901 Long term (current) use of anticoagulants: Secondary | ICD-10-CM | POA: Diagnosis not present

## 2023-03-07 DIAGNOSIS — Z8249 Family history of ischemic heart disease and other diseases of the circulatory system: Secondary | ICD-10-CM | POA: Diagnosis not present

## 2023-03-07 DIAGNOSIS — Z87891 Personal history of nicotine dependence: Secondary | ICD-10-CM | POA: Diagnosis not present

## 2023-03-07 DIAGNOSIS — D6869 Other thrombophilia: Secondary | ICD-10-CM | POA: Insufficient documentation

## 2023-03-07 NOTE — Progress Notes (Signed)
Primary Care Physician: Norm Salt, [place] Primary Cardiologist: Dr Izora Ribas Primary Electrophysiologist: Dr Lalla Brothers Referring Physician: Dr Linus Salmons Chelsea [name] is a 72 y.o. female with a history of HLD, HTN, aortic regurgitation, mitral regurgitation, aortic aneurysm, atrial fibrillation who presents for follow up in the [facility]. Patient wore a cardiac monitor 07/2022 which showed a 14% afib burden. She underwent afib ablation with Dr Lalla Brothers on 02/07/23. Patient is on Eliquis for a CHADS2VASC score of 8.  On follow up today, patient reports that she has done well since her ablation with no symptomatic afib. She denies chest pain, swallowing pain, or groin issues. No bleeding issues on anticoagulation.   Today, she denies symptoms of palpitations, chest pain, shortness of breath, orthopnea, PND, lower extremity edema, dizziness, presyncope, syncope, snoring, daytime somnolence, bleeding, or neurologic sequela. The patient is tolerating medications without difficulties and is otherwise without complaint today.    Atrial Fibrillation Risk Factors:  she does not have symptoms or diagnosis of sleep apnea. she does not have a history of rheumatic fever.   she has a BMI of Body mass index is 29.47 kg/m.Marland Kitchen Filed Weights   03/07/23 1340  Weight: 82.8 kg    Family History  Problem Relation Age of Onset   Hypertension Mother        deceased   Peripheral vascular disease Mother    Cirrhosis Father        DeCEASED   Cancer Brother    Drug abuse Brother        overdose, in 1 brother   Heart attack Other        grandmother deceased   Breast cancer Neg Hx    Stroke Neg Hx      Atrial Fibrillation Management history:  Previous antiarrhythmic drugs: none Previous cardioversions: none Previous ablations: 02/07/23 Anticoagulation history: Eliquis   Past Medical History:  Diagnosis Date   Anxiety    Aortic aneurysm without rupture     followed by dr dr h. Katrinka Blazing,  per echo 08/ 2021 44mm and chest CT 03/ 2021 4.0.cm   Arthritis    lower back   BPPV (benign paroxysmal positional vertigo)    Chronic back pain    CKD (chronic kidney disease), stage II    Depression    GERD (gastroesophageal reflux disease)    Headache    Hemiparesis affecting left side as late effect of cerebrovascular accident    Hiatal hernia    History of anemia    History of cerebrovascular accident (CVA) with residual deficit 03/2006   (neurologist--- dr Frances Furbish) admission in epic, acute right thalamic hypertensive hemorrhage w/ left sided hemiparesis   History of gastric ulcer 2003   w/ upper gi bleed,  egd with no intervention needed   Hyperlipidemia    Hypertension    followed by cardiology--- dr h. Katrinka Blazing and HTN clinic   (nuclear stress test 01-31-20121 normal w/ ef 54%; cardaic cath 08-07-2010 no sig coronary obstruction, preserved LVF, mild AR   Multinodular goiter    followed by pcp,  last ultrasound in epic 03-20-2020 , never met critiria for bx   Nonrheumatic aortic (valve) insufficiency    followed by cardiologist--- dr h. 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,  followed by dr Frances Furbish   Urge incontinence    Wears glasses    Wears hearing aid in both ears    Wears partial dentures    upper and lower   Past Surgical History:  Procedure Laterality Date   ABDOMINAL HYSTERECTOMY     ATRIAL FIBRILLATION ABLATION N/A 02/07/2023   Procedure: ATRIAL FIBRILLATION ABLATION;  Surgeon: Lanier Prude, MD;  Location: [facility] INVASIVE CV LAB;  Service: Cardiovascular;  Laterality: N/A;   CARDIAC  CATHETERIZATION  08/07/2010   @ [facility]  dr Riley Kill---  no sig coronary obstruction, preserved LVF, mild AR   COLON RESECTION SIGMOID N/A 11/08/2021   Procedure: COLON RESECTION SIGMOID; DRAINAGE OF INTRAABDOMINAL ABSCESS;  Surgeon: Violeta Gelinas, MD;  Location: [facility] OR;  Service: General;  Laterality: N/A;   COLOSTOMY N/A 11/08/2021   Procedure: COLOSTOMY;  Surgeon: Violeta Gelinas, MD;  Location[facility] OR;  Service: General;  Laterality: N/A;   COLOSTOMY REVERSAL N/A 05/17/2022   Procedure: COLOSTOMY REVERSAL (HARTMAN);  Surgeon: Violeta Gelinas, MD;  Location: [facility] OR;  Service: General;  Laterality: N/A;   MASS EXCISION N/A 06/04/2021   Procedure: EXCISION SUCUTANEOUS MASS X2, UPPER BACK;  Surgeon: Berna Bue, MD;  Location: [facility] OR;  Service: General;  Laterality: N/A;   TOTAL KNEE ARTHROPLASTY Left 01/05/2016   Procedure: TOTAL KNEE ARTHROPLASTY;  Surgeon: Gean Birchwood, MD;  Location: [facility] OR;  Service: Orthopedics;  Laterality: Left;   TUBAL LIGATION Bilateral 2000    Current Outpatient Medications  Medication Sig Dispense Refill   amLODipine (NORVASC) 10 MG tablet Take 1 tablet (10 mg total) by mouth daily. 90 tablet 2   apixaban (ELIQUIS) 5 MG TABS tablet Take 1 tablet (5 mg total) by mouth 2 (two) times daily. 60 tablet 11   Ascorbic Acid (VITAMIN C) 1000 MG tablet Take 1,000 mg by mouth in the morning.     atorvastatin (LIPITOR) 10 MG tablet Take 1 tablet (10 mg total) by mouth daily. 90 tablet 2   b complex vitamins capsule Take 1 capsule by mouth in the morning.     Biotin 5000 MCG TABS Take 5,000 mcg by mouth in the morning.     cholecalciferol (VITAMIN D) 25 MCG (1000 UNIT) tablet Take 1,000 Units by mouth daily as needed (with vertigo).     citalopram (CELEXA) 20 MG tablet Take 20 mg by mouth at bedtime.     cloNIDine (CATAPRES) 0.3 MG tablet Take 0.3 mg by mouth 2 (two) times daily.     cyclobenzaprine (FLEXERIL) 10 MG tablet Take 10 mg by mouth as needed for muscle spasms.      gabapentin (NEURONTIN) 800 MG tablet Take 1 tablet (800 mg total) by mouth 2 (two) times daily. (Patient taking differently: Take 400 mg by mouth 2 (two) times daily.) 180 tablet 3   hydrALAZINE (APRESOLINE) 25 MG tablet TAKE 1 TABLET (25 MG TOTAL) BY MOUTH 3 (THREE) TIMES DAILY. 270 tablet 0   indapamide (LOZOL) 2.5 MG tablet TAKE 1 TABLET (2.5 MG TOTAL) BY MOUTH DAILY. 90 tablet 0   losartan (COZAAR) 50 MG tablet Take 1 tablet (50 mg total) by mouth daily. 90 tablet 3   meclizine (ANTIVERT) 25 MG tablet Take 25 mg by mouth 3 (three) times  daily as needed (vertigo).     metoprolol succinate (TOPROL-XL) 100 MG 24 hr tablet Take 1.5 tablets (150 mg total) by mouth daily. Take with or immediately following a meal. 135 tablet 3   Multiple Vitamin (MULTIVITAMIN WITH MINERALS) TABS tablet Take 1 tablet by mouth in the morning. 50+     ondansetron (ZOFRAN) 8 MG tablet Take 8 mg by mouth 3 (three) times daily as needed for nausea or vomiting.     pantoprazole (PROTONIX) 40 MG tablet Take 40 mg by mouth daily before breakfast.     potassium chloride SA (KLOR-CON M20) 20 MEQ tablet Take 1 tablet (20 mEq total) by mouth 2 (two) times daily. (Patient taking differently: Take 20 mEq by mouth daily.) 180 tablet 3   spironolactone (ALDACTONE) 25 MG tablet TAKE 1 TABLET (25 MG TOTAL) BY MOUTH DAILY. 90 tablet 2   traMADol (ULTRAM) 50 MG tablet Take 1 tablet (50 mg total) by mouth every 6 (six) hours as needed for severe pain. 25 tablet 1   Vitamin A 2400 MCG (8000 UT) CAPS Take 2,400 mcg by mouth in the morning.     vitamin B-12 (CYANOCOBALAMIN) 1000 MCG tablet Take 1,000 mcg by mouth 2 (two) times a week.     vitamin E 180 MG (400 UNITS) capsule Take 400 Units by mouth in the morning.     colchicine 0.6 MG tablet Take 1 tablet (0.6 mg total) by mouth 2 (two) times daily for 5 days. 10 tablet 0   No current facility-administered medications for this encounter.    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  Tobacco Use   Smoking status: Former    Packs/day: 1.00    Years: 50.00    Additional pack years: 0.00    Total pack years: 50.00    Types: Cigarettes    Quit date: 10/29/2021    Years since quitting: 1.3   Smokeless tobacco: Never   Tobacco comments:    Former smoker 03/07/23  Vaping Use   Vaping Use: Never used  Substance and Sexual Activity   Alcohol use: No    Alcohol/week: 0.0 standard drinks of alcohol   Drug use: No   Sexual activity: Never  Other Topics Concern   Not on file  Social History Narrative   Patient consumes no caffeine   Social Determinants of Health   Financial Resource Strain: Not on file  Food Insecurity: Not on file  Transportation Needs: Not on file  Physical Activity: Not on file  Stress: Not on file  Social Connections: Not on file  Intimate Partner Violence: Not on file     ROS- All systems are reviewed and negative except as per the HPI above.  Physical Exam: Vitals:   03/07/23 1340  BP: 132/88  Pulse: 75  Weight: 82.8 kg  Height: 5\' 6"  (1.676 m)    GEN- The patient is a well appearing female, alert and oriented  x 3 today.   Head- normocephalic, atraumatic Eyes-  Sclera clear, conjunctiva pink Ears- hearing intact Oropharynx- clear Neck- supple  Lungs- Clear to ausculation bilaterally, normal work of breathing Heart- Regular rate and rhythm, no murmurs, rubs or gallops  GI- soft, NT, ND, + BS Extremities- no clubbing, cyanosis, or edema MS- no  significant deformity or atrophy Skin- no rash or lesion Psych- euthymic mood, full affect Neuro- strength and sensation are intact  Wt Readings from Last 3 Encounters:  03/07/23 82.8 kg  02/22/23 83.5 kg  02/07/23 76.7 kg    EKG today demonstrates  SR Vent. rate 75 BPM PR interval 154 ms QRS duration 104 ms QT/QTcB 430/480 ms  Echo 02/02/23 demonstrated  1. Left ventricular ejection fraction, by estimation, is 55 to 60%. The  left ventricle has normal function. The left ventricle has no regional  wall motion abnormalities. Left ventricular diastolic parameters are  consistent with Grade I diastolic dysfunction (impaired relaxation).   2. Right ventricular systolic function is normal. The right ventricular  size is normal.   3. Left atrial size was mildly dilated.   4. The mitral valve is normal in structure. Mild to moderate mitral valve  regurgitation.   5. The aortic valve is tricuspid. Aortic valve regurgitation is moderate.  Aortic valve sclerosis/calcification is present, without any evidence of  aortic stenosis.   6. Aortic dilatation noted. There is moderate dilatation of the ascending  aorta, measuring 43 mm.   7. The inferior vena cava is normal in size with greater than 50%  respiratory variability, suggesting right atrial pressure of 3 mmHg.   Comparison(s): The left ventricular function is unchanged.   Epic records are reviewed at length today.  CHA2DS2-VASc Score = 8  The patient's score is based upon: CHF History: 1 HTN History: 1 Diabetes History: 1 Stroke History: 2 Vascular Disease History: 1 Age Score: 1 Gender Score: 1       ASSESSMENT AND PLAN: 1. Paroxysmal Atrial Fibrillation (ICD10:  I48.0) The patient's CHA2DS2-VASc score is 8, indicating a 10.8% annual risk of stroke.   S/p afib ablation 02/07/23 Patient appears to be maintaining SR.  Continue Eliquis 5 mg BID with no missed doses for 3 months post ablation. Continue Toprol 150 mg  daily  2. Secondary Hypercoagulable State (ICD10:  D68.69) The patient is at significant risk for stroke/thromboembolism based upon her CHA2DS2-VASc Score of 8.  Continue Apixaban (Eliquis).   3. HTN Stable, no changes today.   Follow up with Dr Lalla BrothersLambert as scheduled.    Jorja Loaicky Niccolo Burggraf PA-C [facility] [address] [place], [place] [address] [phone] 03/07/2023 2:02 PM

## 2023-03-08 ENCOUNTER — Ambulatory Visit
Admission: RE | Admit: 2023-03-08 | Discharge: 2023-03-08 | Disposition: A | Discharge status: Home / Self Care | Payer: 59 | Source: Ambulatory Visit | Attending: Physician Assistant | Admitting: Physician Assistant

## 2023-03-08 DIAGNOSIS — Z1231 Encounter for screening mammogram for malignant neoplasm of breast: Secondary | ICD-10-CM

## 2023-03-10 DIAGNOSIS — N289 Disorder of kidney and ureter, unspecified: Secondary | ICD-10-CM | POA: Diagnosis not present

## 2023-03-10 DIAGNOSIS — R600 Localized edema: Secondary | ICD-10-CM | POA: Diagnosis not present

## 2023-03-10 DIAGNOSIS — I10 Essential (primary) hypertension: Secondary | ICD-10-CM | POA: Diagnosis not present

## 2023-03-10 DIAGNOSIS — G44209 Tension-type headache, unspecified, not intractable: Secondary | ICD-10-CM | POA: Diagnosis not present

## 2023-03-10 DIAGNOSIS — E782 Mixed hyperlipidemia: Secondary | ICD-10-CM | POA: Diagnosis not present

## 2023-03-10 DIAGNOSIS — Z0001 Encounter for general adult medical examination with abnormal findings: Secondary | ICD-10-CM | POA: Diagnosis not present

## 2023-03-10 DIAGNOSIS — K219 Gastro-esophageal reflux disease without esophagitis: Secondary | ICD-10-CM | POA: Diagnosis not present

## 2023-03-30 DIAGNOSIS — H25813 Combined forms of age-related cataract, bilateral: Secondary | ICD-10-CM | POA: Diagnosis not present

## 2023-04-04 DIAGNOSIS — M47817 Spondylosis without myelopathy or radiculopathy, lumbosacral region: Secondary | ICD-10-CM | POA: Diagnosis not present

## 2023-04-26 ENCOUNTER — Telehealth: Payer: Self-pay | Admitting: Cardiology

## 2023-04-26 MED ORDER — APIXABAN 5 MG PO TABS: 5.0000 mg | ORAL_TABLET | Freq: Two times a day (BID) | ORAL | 5 refills | Status: DC

## 2023-04-26 NOTE — Telephone Encounter (Signed)
Pt c/o medication issue:  1. Name of Medication: apixaban (ELIQUIS) 5 MG TABS tablet   2. How are you currently taking this medication (dosage and times per day)? As prescribed  3. Are you having a reaction (difficulty breathing--STAT)?   4. What is your medication issue? Patient would like to have a 12 month fill on this medication.

## 2023-04-26 NOTE — Telephone Encounter (Signed)
Patient stated while was seen by Dr. Katrinka Blazing, he wrote her year prescription for Eliquis . Explained our current policy, patient voiced understanding.

## 2023-04-26 NOTE — Telephone Encounter (Signed)
Our policy is that we can only rx 6 months at a time. What is the reason the pt needs a year supply?

## 2023-04-27 ENCOUNTER — Telehealth: Payer: Self-pay | Admitting: Internal Medicine

## 2023-04-27 MED ORDER — INDAPAMIDE 2.5 MG PO TABS: 2.5000 mg | ORAL_TABLET | Freq: Every day | ORAL | 3 refills | Status: DC

## 2023-04-27 NOTE — Telephone Encounter (Signed)
*  STAT* If patient is at the pharmacy, call can be transferred to refill team.   1. Which medications need to be refilled? (please list name of each medication and dose if known)   indapamide (LOZOL) 2.5 MG tablet   2. Which pharmacy/location (including street and city if local pharmacy) is medication to be sent to?  [facility] - [place], [place] - [address]   3. Do they need a 30 day or 90 day supply?   90 day  Patient stated she has 5 tablets left.

## 2023-04-27 NOTE — Telephone Encounter (Signed)
Pt's medication was sent to pt's pharmacy as requested. Confirmation received.  °

## 2023-05-02 ENCOUNTER — Telehealth: Payer: Self-pay | Admitting: Internal Medicine

## 2023-05-02 MED ORDER — LOSARTAN POTASSIUM 50 MG PO TABS: 50.0000 mg | ORAL_TABLET | Freq: Every day | ORAL | 3 refills | Status: DC

## 2023-05-02 MED ORDER — INDAPAMIDE 2.5 MG PO TABS: 2.5000 mg | ORAL_TABLET | Freq: Every day | ORAL | 3 refills | Status: DC

## 2023-05-02 NOTE — Telephone Encounter (Signed)
*  STAT* If patient is at the pharmacy, call can be transferred to refill team.   1. Which medications need to be refilled? (please list name of each medication and dose if known)  losartan (COZAAR) 50 MG tablet  indapamide (LOZOL) 2.5 MG tablet   2. Which pharmacy/location (including street and city if local pharmacy) is medication to be sent to?  [facility] - [place], [place] - [address]    3. Do they need a 30 day or 90 day supply? 90 day supply   Took last tablet of losartan. Pharmacy has not sent her indapamide so she believes th prescription was not received.

## 2023-05-02 NOTE — Telephone Encounter (Signed)
Pt's medication was sent to pt's pharmacy as requested. Confirmation received.  °

## 2023-05-09 ENCOUNTER — Ambulatory Visit: Payer: 59 | Attending: Cardiology | Admitting: Cardiology

## 2023-05-09 ENCOUNTER — Encounter: Payer: Self-pay | Admitting: Cardiology

## 2023-05-09 VITALS — BP 128/70 | HR 65 | Ht 66.0 in | Wt 189.0 lb

## 2023-05-09 DIAGNOSIS — I10 Essential (primary) hypertension: Secondary | ICD-10-CM

## 2023-05-09 DIAGNOSIS — I480 Paroxysmal atrial fibrillation: Secondary | ICD-10-CM | POA: Diagnosis not present

## 2023-05-09 NOTE — Patient Instructions (Signed)
Medication Instructions:  Your physician recommends that you continue on your current medications as directed. Please refer to the Current Medication list given to you today.  *If you need a refill on your cardiac medications before your next appointment, please call your pharmacy*  Follow-Up: At [facility], you and your health needs are our priority.  As part of our continuing mission to provide you with exceptional heart care, we have created designated Provider Care Teams.  These Care Teams include your primary Cardiologist (physician) and Advanced Practice Providers (APPs -  Physician Assistants and Nurse Practitioners) who all work together to provide you with the care you need, when you need it.  Your next appointment:   1 year(s)  Provider:   You will see one of the following Advanced Practice Providers on your designated Care Team:   Renee Ursuy, PA-C Michael "Andy" Tillery, PA-C Suzann Riddle, NP  

## 2023-05-09 NOTE — Progress Notes (Signed)
  Electrophysiology Office Follow up Visit Note:    Date:  05/09/2023   ID:  Chelsea [name], DOB [birth year], MRN [id number]  PCP:  Norm Salt, PA  [facility] Cardiologist:  Christell Constant, MD  [facility] Electrophysiologist:  Lanier Prude, MD    Interval History:    Chelsea [name] is a 72 y.o. female who presents for a follow up visit.   She had an A-fib ablation on February 07, 2023.  During the procedure, the pulmonary veins were isolated. She saw Clide Cliff in the A-fib clinic March 07, 2023.  At that appointment she reported no recurrence of her atrial arrhythmia.  She is on Eliquis for stroke prophylaxis.  She has a CHA2DS2-VASc of 8.  Today she tells me she has been doing well without sustained recurrence of atrial arrhythmia.  She continues to take Eliquis for stroke prophylaxis without bleeding issues.    Past medical, surgical, social and family history were reviewed.  ROS:   Please see the history of present illness.    All other systems reviewed and are negative.  EKGs/Labs/Other Studies Reviewed:    The following studies were reviewed today:  EKG:  The ekg ordered today demonstrates sinus rhythm.   Physical Exam:    VS:  BP 128/70   Pulse 65   Ht 5\' 6"  (1.676 m)   Wt 189 lb (85.7 kg)   SpO2 97%   BMI 30.51 kg/m     Wt Readings from Last 3 Encounters:  05/09/23 189 lb (85.7 kg)  03/07/23 182 lb 9.6 oz (82.8 kg)  02/22/23 184 lb (83.5 kg)     GEN:  Well nourished, well developed in no acute distress CARDIAC: RRR, no murmurs, rubs, gallops RESPIRATORY:  Clear to auscultation without rales, wheezing or rhonchi       ASSESSMENT:    1. Paroxysmal atrial fibrillation (HCC)   2. Primary hypertension    PLAN:    In order of problems listed above:  #Atrial fibrillation On Eliquis for elevated CHA2DS2-VASc Doing well after February 07, 2019 for catheter ablation.  #Hypertension At goal today.  Recommend checking blood  pressures 1-2 times per week at home and recording the values.  Recommend bringing these recordings to the primary care physician.       Signed, Steffanie Dunn, MD, [facility], [facility] 05/09/2023 6:25 PM    Electrophysiology [facility]

## 2023-05-10 ENCOUNTER — Ambulatory Visit: Payer: Self-pay | Admitting: Licensed Clinical Social Worker

## 2023-05-10 NOTE — Patient Instructions (Signed)
  It was a pleasure speaking with you today. Per your request a Care Coordination phone appointment is scheduled 05/16/23  Sammuel Hines, LCSW Social Work Care Coordination  [phone]

## 2023-05-10 NOTE — Patient Outreach (Signed)
  Care Coordination  Initial Visit Note   05/10/2023 Name: Chelsea [name] MRN: [id number] DOB: 09-Aug-1951  Chelsea [name] is a 72 y.o. year old female who sees Chelsea [name], [place] for primary care. I spoke with  Chelsea [name] by phone today.  What matters to the patients health and wellness today?    Patient scheduled phone appointment to obtain additional information about the Care Coordination Program..   SDOH assessments and interventions completed:  No   Care Coordination Interventions:  No, not indicated   Follow up plan: Follow up call scheduled for 05/16/23    Encounter Outcome:  Pt. Visit Completed   Sammuel Hines, LCSW Social Work Care Coordination  [facility] [employer] [employer] [phone]

## 2023-05-11 ENCOUNTER — Ambulatory Visit: Payer: 59 | Admitting: Cardiology

## 2023-05-16 ENCOUNTER — Telehealth: Payer: Self-pay | Admitting: Adult Health

## 2023-05-16 ENCOUNTER — Ambulatory Visit: Payer: Self-pay | Admitting: Licensed Clinical Social Worker

## 2023-05-16 MED ORDER — GABAPENTIN 800 MG PO TABS: 800.0000 mg | ORAL_TABLET | Freq: Two times a day (BID) | ORAL | 3 refills | Status: DC

## 2023-05-16 NOTE — Telephone Encounter (Signed)
Pt is requesting a refill for gabapentin (NEURONTIN) 800 MG tablet .  Pharmacy: [facility]

## 2023-05-16 NOTE — Telephone Encounter (Signed)
Gabapentin refill sent to [facility].

## 2023-05-17 NOTE — Patient Outreach (Signed)
  Care Coordination  Initial Visit Note   05/17/2023 Name: Chelsea [name] MRN: [id number] DOB: July 24, 1951  Chelsea [name] is a 72 y.o. year old female who sees Norm Salt, [place] for primary care. I spoke with  Chelsea [name] by phone today.  What matters to the patients health and wellness today?  Managing her medication  Patient reports only concern is that she needs a pillbox to assist with managing her medications.  Also reports concerns of not being able to get refills from PCP when needed.  Will discuss this with provider during her next office visit on July 11th.   Pillbox will be mailed to patient.   Goals Addressed             This Visit's Progress    Care Coordination Activities       Activities and task to complete in order to accomplish goals.   Call [facility] to follow up on making sure you have contact information and are connected with your [facility] Medicare Health Navigator Keep all upcoming appointment discussed today ( July 15th appointment with [facility]) Continue with compliance of taking medication prescribed by Doctor I will be mailing you a pillbox.        SDOH assessments and interventions completed:  Yes  SDOH Interventions Today    Flowsheet Row Most Recent Value  SDOH Interventions   Food Insecurity Interventions Intervention Not Indicated  Housing Interventions Intervention Not Indicated  Transportation Interventions Intervention Not Indicated, Payor Benefit  Utilities Interventions Intervention Not Indicated  Financial Strain Interventions Intervention Not Indicated  Stress Interventions Intervention Not Indicated       Care Coordination Interventions:  Yes, provided  Interventions Today    Flowsheet Row Most Recent Value  Chronic Disease   Chronic disease during today's visit Hypertension (HTN)  General Interventions   General Interventions Discussed/Reviewed General Interventions Discussed, Communication with   [reviewed care coordination program]  Communication with RN, Social Work  [employer]. office]  Education Interventions   Education Provided Provided Education  Provided Verbal Education On Insurance Plans  [has [facility] Navigator and uses Medicaid Transportation]  Mental Health Interventions   Mental Health Discussed/Reviewed Mental Health Reviewed  [currently being followed by [facility]]  Pharmacy Interventions   Pharmacy Dicussed/Reviewed Pharmacy Topics Discussed  [would like a pillbox,  difficulty getting refills from PCP]       Follow up plan: Follow up call scheduled for 05/30/23    Encounter Outcome:  Pt. Visit Completed   Sammuel Hines, LCSW Social Work Care Coordination  [facility] [employer] [phone]

## 2023-05-17 NOTE — Patient Instructions (Addendum)
Social Work Visit Information  Thank you for taking time to visit with me today. Please don't hesitate to contact me if I can be of assistance to you.   Following are the goals we discussed today:   Goals Addressed             This Visit's Progress    Care Coordination Activities       Activities and task to complete in order to accomplish goals.   Call [facility] to follow up on making sure you have contact information and are connected with your [facility] Medicare Health Navigator Keep all upcoming appointment discussed today ( July 15th appointment with [facility]) Continue with compliance of taking medication prescribed by Doctor I will be mailing you a pillbox.         Our next appointment is by telephone on 05/30/23 at 2:00   Please call the care guide team at [phone] if you need to cancel or reschedule your appointment.   If you or anyone you know are experiencing a Mental Health or Behavioral Health Crisis or need someone to talk to, please call the Suicide and Crisis Lifeline: 988 call the [place] National Suicide Prevention Lifeline: 289 036 6635 or TTY: (705)539-0638 TTY 548 799 8159) to talk to a trained counselor call 1-800-273-TALK (toll free, 24 hour hotline) go to [facility] [address], [place] [phone])   Patient verbalizes understanding of instructions and care plan provided today and agrees to view in MyChart. Active MyChart status and patient understanding of how to access instructions and care plan via MyChart confirmed with patient.       Sammuel Hines, LCSW Social Work Care Coordination  [facility] [employer] [phone]

## 2023-05-30 ENCOUNTER — Ambulatory Visit: Payer: Self-pay | Admitting: Licensed Clinical Social Worker

## 2023-05-30 NOTE — Patient Instructions (Addendum)
Social Work Visit Information  Thank you for taking time to visit with me today. Please don't hesitate to contact me if I can be of assistance to you.   Following are the goals we discussed today:   Goals Addressed             This Visit's Progress    Care Coordination Activities       Activities and task to complete in order to accomplish goals.   Call your [facility] Medicare Health Navigator to ask about the bag that you need for your medication  Keep all upcoming appointment discussed today ( July 11th PCP & July 15th psychiatry ) Continue with compliance of taking medication prescribed by Doctor I am glad you received the pillbox.   Complete Advance Directive packet,  Have advance directive notarized and provide a copy to provider office  Review EMMI educational information on Advance Directive. Look for an e-mail from [facility]. I have placed a referral with [facility] they will contact you with housing resource options.          Our next appointment is by telephone on 06/30/23 at 2:30   Please call the care guide team at [phone] if you need to cancel or reschedule your appointment.   If you or anyone you know are experiencing a Mental Health or Behavioral Health Crisis or need someone to talk to, please call the Suicide and Crisis Lifeline: 988 call the [place] National Suicide Prevention Lifeline: 9344693195 or TTY: (410)588-1063 TTY 586-473-5334) to talk to a trained counselor call 1-800-273-TALK (toll free, 24 hour hotline) go to [facility] [address] [place] [phone])   Patient verbalizes understanding of instructions and care plan provided today and agrees to view in MyChart. Active MyChart status and patient understanding of how to access instructions and care plan via MyChart confirmed with patient.       Sammuel Hines, LCSW Social Work Care Coordination  [facility] Emmie Niemann [employer] [phone]

## 2023-05-30 NOTE — Patient Outreach (Addendum)
  Care Coordination  Follow Up Visit Note   05/30/2023 Name: Chelsea [name] MRN: [id number] DOB: [birth year]  Chelsea [name] is a 72 y.o. year old female who sees Norm Salt, [place] for primary care. I spoke with  [name] by phone today.  What matters to the patients health and wellness today?    Patient reports doing well today. See activities below for next task to compete goal of getting Advance Directive completed   Goals Addressed             This Visit's Progress    Care Coordination Activities       Activities and task to complete in order to accomplish goals.   Call your [facility] Medicare Health Navigator to ask about the bag that you need for your medication  Keep all upcoming appointment discussed today ( July 11th PCP & July 15th psychiatry ) Continue with compliance of taking medication prescribed by Doctor I am glad you received the pillbox.   Complete Advance Directive packet,  Have advance directive notarized and provide a copy to provider office  Review EMMI educational information on Advance Directive. Look for an e-mail from [facility]. I have placed a referral with [facility] they will contact you with housing resource options.         SDOH assessments and interventions completed:  No   Care Coordination Interventions:  Yes, provided  Interventions Today    Flowsheet Row Most Recent Value  Chronic Disease   Chronic disease during today's visit Hypertension (HTN)  General Interventions   General Interventions Discussed/Reviewed General Interventions Reviewed, [employer], Communication with  [reviewed up coming appointments]  Communication with --  [Referral made to [facility] for housing resources]  Education Interventions   Education Provided Provided Education  [EMMI Engagement Manager]  Mental Health Interventions   Mental Health Discussed/Reviewed Mental Health Reviewed  [has appointment July 15th]  Advanced  Directive Interventions   Advanced Directives Discussed/Reviewed Advanced Directives Discussed, Advanced Care Planning, Provided resource for acquiring and filling out documents       Follow up plan: Follow up call scheduled for 30 days    Encounter Outcome:  Pt. Visit Completed   Sammuel Hines, LCSW Social Work Care Coordination  [facility] Emmie Niemann [employer] [phone]

## 2023-06-09 DIAGNOSIS — N289 Disorder of kidney and ureter, unspecified: Secondary | ICD-10-CM | POA: Diagnosis not present

## 2023-06-09 DIAGNOSIS — K219 Gastro-esophageal reflux disease without esophagitis: Secondary | ICD-10-CM | POA: Diagnosis not present

## 2023-06-09 DIAGNOSIS — R600 Localized edema: Secondary | ICD-10-CM | POA: Diagnosis not present

## 2023-06-09 DIAGNOSIS — G44209 Tension-type headache, unspecified, not intractable: Secondary | ICD-10-CM | POA: Diagnosis not present

## 2023-06-09 DIAGNOSIS — I10 Essential (primary) hypertension: Secondary | ICD-10-CM | POA: Diagnosis not present

## 2023-06-09 DIAGNOSIS — E782 Mixed hyperlipidemia: Secondary | ICD-10-CM | POA: Diagnosis not present

## 2023-06-30 ENCOUNTER — Ambulatory Visit: Payer: Self-pay | Admitting: Licensed Clinical Social Worker

## 2023-06-30 NOTE — Patient Instructions (Signed)
Social Work Visit Information  Thank you for taking time to visit with me today. Please don't hesitate to contact me if I can be of assistance to you.   Following are the goals we discussed today:   Goals Addressed             This Visit's Progress    COMPLETED: Care Coordination Activities for community support       Activities and task to complete in order to accomplish goals.   Continue to keel all upcoming appointment discussed today .  Glad things are going great with your new psychiatrist  Continue with compliance of taking medication prescribed by Doctor Complete Advance Directive packet,  Have advance directive notarized and provide a copy to provider office  Congratulations on completing the housing application with [employer].         Patient does not desire continued follow-up by social work. They will contact the office if needed  Please call the care guide team at [phone] if you need to cancel or reschedule your appointment.   If you or anyone you know are experiencing a Mental Health or Behavioral Health Crisis or need someone to talk to, please call the Suicide and Crisis Lifeline: 988 call the [place] National Suicide Prevention Lifeline: 639-470-1080 or TTY: 251-768-0214 TTY (860)310-9147) to talk to a trained counselor call 1-800-273-TALK (toll free, 24 hour hotline) go to [facility] [address], [place] [phone])   Patient verbalizes understanding of instructions and care plan provided today and agrees to view in MyChart. Active MyChart status and patient understanding of how to access instructions and care plan via MyChart confirmed with patient.       Sammuel Hines, LCSW Social Work Care Coordination  [facility] Emmie Niemann [employer] [phone]

## 2023-06-30 NOTE — Patient Outreach (Signed)
  Care Coordination  Follow Up Visit Note   06/30/2023 Name: Chelsea [name] MRN: [id number] DOB: 07-03-1951  Chelsea [name] is a 72 y.o. year old female who sees Norm Salt, [place] for primary care. I spoke with  [name] by phone today.  What matters to the patients health and wellness today?    Patient reports she is doing well, declines ongoing support from Care Coordination.  No new needs identified during this encounter.   Goals Addressed             This Visit's Progress    COMPLETED: Care Coordination Activities for community support       Activities and task to complete in order to accomplish goals.   Continue to keel all upcoming appointment discussed today .  Glad things are going great with your new psychiatrist  Continue with compliance of taking medication prescribed by Doctor Complete Advance Directive packet,  Have advance directive notarized and provide a copy to provider office  Congratulations on completing the housing application with [employer].        SDOH assessments and interventions completed:  No   Care Coordination Interventions:  Yes, provided  Interventions Today    Flowsheet Row Most Recent Value  Chronic Disease   Chronic disease during today's visit Hypertension (HTN)  General Interventions   General Interventions Discussed/Reviewed General Interventions Reviewed, [employer]  [reveiwed care coordination for [employer] support / declined]  Education Interventions   Education Provided Provided Education, Provided [employer]  [did not receive EMMI educational information will mail paper information]  Provided Engineer, [employer]  [provided update on [place]  information was e-mailed to patient.  She has also contacted Psychologist, occupational and completed an application]  Mental Health Interventions   Mental Health Discussed/Reviewed Mental Health Reviewed  [had two appointment with new  psychiatrist and all is going well for her]  Advanced Directive Interventions   Advanced Directives Discussed/Reviewed Advanced Directives Discussed, Provided resource for acquiring and filling out documents  [did not received packet and requested another]       Follow up plan: No further intervention required.   Encounter Outcome:  Pt. Visit Completed   Sammuel Hines, LCSW Social Work Care Coordination  [facility] Emmie Niemann [employer] [phone]

## 2023-07-21 ENCOUNTER — Other Ambulatory Visit (HOSPITAL_COMMUNITY): Payer: Self-pay

## 2023-07-26 ENCOUNTER — Telehealth: Payer: Self-pay | Admitting: Cardiology

## 2023-07-26 NOTE — Telephone Encounter (Signed)
Called pt and left message informing her that her medications were already at her pharmacy as requested and that her pharmacy stated that she just picked these medication up, Eliquis 5 mg on 07/25/23 and Losartan on 05/02/2023 and if she has any other problems, questions or concerns, to give our office a call back.

## 2023-07-26 NOTE — Telephone Encounter (Signed)
Patient states she is returning a call. 

## 2023-07-26 NOTE — Telephone Encounter (Signed)
Informed pt that I spoke w/ her pharmacy (she thought she was out of refills for Eliquis).  She is aware they have further refills on Losartan & Eliquis -- she appreciates the information/call back.

## 2023-07-26 NOTE — Telephone Encounter (Signed)
*  STAT* If patient is at the pharmacy, call can be transferred to refill team.   1. Which medications need to be refilled? (please list name of each medication and dose if known)   apixaban (ELIQUIS) 5 MG TABS tablet  losartan (COZAAR) 50 MG tablet    2. Would you like to learn more about the convenience, safety, & potential cost savings by using the [facility]? No   3. Are you open to using the [facility] (Type [facility]. ) No   4. Which pharmacy/location (including street and city if local pharmacy) is medication to be sent to?[facility] - [place], [place] - [address]    5. Do they need a 30 day or 90 day supply? 90 day

## 2023-08-08 DIAGNOSIS — M47817 Spondylosis without myelopathy or radiculopathy, lumbosacral region: Secondary | ICD-10-CM | POA: Diagnosis not present

## 2023-08-10 ENCOUNTER — Encounter: Payer: Self-pay | Admitting: Adult Health

## 2023-08-10 ENCOUNTER — Ambulatory Visit (INDEPENDENT_AMBULATORY_CARE_PROVIDER_SITE_OTHER): Payer: 59 | Admitting: Adult Health

## 2023-08-10 VITALS — BP 125/87 | HR 77 | Ht 67.0 in | Wt 201.0 lb

## 2023-08-10 DIAGNOSIS — R279 Unspecified lack of coordination: Secondary | ICD-10-CM

## 2023-08-10 DIAGNOSIS — G890 Central pain syndrome: Secondary | ICD-10-CM

## 2023-08-10 DIAGNOSIS — I69398 Other sequelae of cerebral infarction: Secondary | ICD-10-CM | POA: Diagnosis not present

## 2023-08-10 MED ORDER — GABAPENTIN 800 MG PO TABS: 800.0000 mg | ORAL_TABLET | Freq: Two times a day (BID) | ORAL | 3 refills | Status: DC

## 2023-08-10 NOTE — Progress Notes (Signed)
PATIENT: Chelsea [name] DOB: [birth year]  REASON FOR VISIT: follow up HISTORY FROM: patient PRIMARY NEUROLOGIST: Dr. Frances Furbish  Chief Complaint  Patient presents with   Follow-up    Rm 20 alone Pt is well and stable, reports no new concerns since last visit.     HISTORY OF PRESENT ILLNESS: Today 08/10/23:  Chelsea [name] is a 72 y.o. 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  She reports that gabapentin 800 mg twice a day is still working fairly well for her.  Denies any significant changes with her gait or balance.  Is using a cane today when ambulating.denies any falls.  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  She returns today for follow-up.  Reports that Pain varies day to day. Continues gabapentin 800 mg BID. Still using rollator. No falls.   Since last visit had an intestine inflammation and had a colostomy bag but that has since been removed.  Reports that they also found an aneurysm on her aorta that they are monitoring for now.  She reports that her family is insisting that she live somewhere with more visibility on her.  They do not like her living alone.  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  She returns today for follow-up.  She reports that her symptoms have remained stable.  Continues to have weakness on the left side.  Reports that gabapentin 800 mg twice a day controls her discomfort.  Reports that she did complete physical therapy.  She states that she had 2 falls in July but fortunately did not suffer any injuries.  She does use a Rollator when ambulating.  She returns today for an evaluation.  HISTORY  08/11/2020: She reports that her weakness has been stable on the left side since 2007.  She does report that when she is stressed out or upset, she feels that her  numbness gets worse on the left side.  She continues to take gabapentin 800 mg twice daily for discomfort on the left side.  She has not been using her ankle brace on the left, she may have misplaced it but will look for it.  She is trying to quit smoking but continues to smoke approximately a pack per day currently.  She has seen cardiology and has been started on metoprolol.  She was told she had a aortic aneurysm that needs to be monitored.  She may need surgery eventually for this.  She saw Dr. Katrinka Blazing in cardiology on 08/08/2020 and I reviewed the note.    She had a brain MRI w/wo contrast on 11/19/08 and I reviewed the results:  IMPRESSION:  1.  No evidence of acute ischemia.  2.  Stable non specific supratentorial subcortical white matter  changes most likely representing areas of ischemic gliosis due to  small vessel disease related to hypertension or diabetes.  3.  Interval near-complete resolution of the previous right  thalamic hemorrhage.  4.  Small focal area of enhancement within the epicenter of the  hemorrhage, which probably represents contrast pooling.  No  evidence of a vascular abnormality however is suggested on the MRI  examination.   She had a brain MRI with and without contrast, MRA head without contrast and MRA neck with and without contrast through [facility] on 10/03/2009 and I  reviewed the results:   Conclusion:  Linear area of postcontrast enhancement in the right thalamus more  likely represents granulation tissue from prior hemorrhage than a  cavernoma given its appearance. The peripheral area of low T2 signal  intensity is most consistent with hemosiderin deposition of remote  blood products.  If outside MRI is available for comparison this  would be helpful to more accurately characterize the chronicity of  this process.   Slight decrease perfusion in the left watershed supratentorial brain  without stenosis identified in the arterial circulation of the neck   or brain. This may be secondary to asymmetric artifact in the study  technique.   Scattered foci of T2 signal hyperintensity in the supratentorial  white matter greater on the right than left which are nonspecific but  most likely related to chronic small vessel ischemic disease.   No acute infarct.   Unremarkable MRA of the intracranial and cervical vessels.      The patient's allergies, current medications, family history, past medical history, past social history, past surgical history and problem list were reviewed and updated as appropriate.     REVIEW OF SYSTEMS: Out of a complete 14 system review of symptoms, the patient complains only of the following symptoms, and all other reviewed systems are negative.  ALLERGIES: Allergies  Allergen Reactions   Other Swelling    Hair dye, caused eye swelling, multiple times experienced this reaction   Hydrocodone Nausea And Vomiting   Oxycodone Nausea And Vomiting    Other reaction(s): Unknown    HOME MEDICATIONS: Outpatient Medications Prior to Visit  Medication Sig Dispense Refill   amLODipine (NORVASC) 10 MG tablet Take 1 tablet (10 mg total) by mouth daily. 90 tablet 2   apixaban (ELIQUIS) 5 MG TABS tablet Take 1 tablet (5 mg total) by mouth 2 (two) times daily. 60 tablet 5   Ascorbic Acid (VITAMIN C) 1000 MG tablet Take 1,000 mg by mouth in the morning.     atorvastatin (LIPITOR) 10 MG tablet Take 1 tablet (10 mg total) by mouth daily. 90 tablet 2   b complex vitamins capsule Take 1 capsule by mouth in the morning.     Biotin 5000 MCG TABS Take 5,000 mcg by mouth in the morning.     cholecalciferol (VITAMIN D) 25 MCG (1000 UNIT) tablet Take 1,000 Units by mouth daily as needed (with vertigo).     citalopram (CELEXA) 20 MG tablet Take 20 mg by mouth at bedtime.     cloNIDine (CATAPRES) 0.3 MG tablet Take 0.3 mg by mouth 2 (two) times daily.     cyclobenzaprine (FLEXERIL) 10 MG tablet Take 10 mg by mouth as needed for muscle  spasms.     gabapentin (NEURONTIN) 800 MG tablet Take 1 tablet (800 mg total) by mouth 2 (two) times daily. 180 tablet 3   hydrALAZINE (APRESOLINE) 25 MG tablet TAKE 1 TABLET (25 MG TOTAL) BY MOUTH 3 (THREE) TIMES DAILY. 270 tablet 0   indapamide (LOZOL) 2.5 MG tablet Take 1 tablet (2.5 mg total) by mouth daily. 90 tablet 3   losartan (COZAAR) 50 MG tablet Take 1 tablet (50 mg total) by mouth daily. 90 tablet 3   meclizine (ANTIVERT) 25 MG tablet Take 25 mg by mouth 3 (three) times daily as needed (vertigo).     methocarbamol (ROBAXIN) 500 MG tablet 2 tab(s) orally 3 times a day AS NEEDED     metoprolol succinate (TOPROL-XL) 100 MG 24 hr tablet Take 1.5  tablets (150 mg total) by mouth daily. Take with or immediately following a meal. 135 tablet 3   Multiple Vitamin (MULTIVITAMIN WITH MINERALS) TABS tablet Take 1 tablet by mouth in the morning. 50+     naproxen (NAPROSYN) 375 MG tablet Take 375 mg by mouth 2 (two) times daily as needed.     ondansetron (ZOFRAN) 8 MG tablet Take 8 mg by mouth 3 (three) times daily as needed for nausea or vomiting.     pantoprazole (PROTONIX) 40 MG tablet Take 40 mg by mouth daily before breakfast.     potassium chloride SA (KLOR-CON M20) 20 MEQ tablet Take 1 tablet (20 mEq total) by mouth 2 (two) times daily. (Patient taking differently: Take 20 mEq by mouth daily.) 180 tablet 3   spironolactone (ALDACTONE) 25 MG tablet TAKE 1 TABLET (25 MG TOTAL) BY MOUTH DAILY. 90 tablet 2   traMADol (ULTRAM) 50 MG tablet Take 1 tablet (50 mg total) by mouth every 6 (six) hours as needed for severe pain. 25 tablet 1   Vitamin A 2400 MCG (8000 UT) CAPS Take 2,400 mcg by mouth in the morning.     vitamin B-12 (CYANOCOBALAMIN) 1000 MCG tablet Take 1,000 mcg by mouth 2 (two) times a week.     vitamin E 180 MG (400 UNITS) capsule Take 400 Units by mouth in the morning.     colchicine 0.6 MG tablet Take 1 tablet (0.6 mg total) by mouth 2 (two) times daily for 5 days. 10 tablet 0   No  facility-administered medications prior to visit.    PAST MEDICAL HISTORY: Past Medical History:  Diagnosis Date   Anxiety    Aortic aneurysm without rupture [facility])    followed by dr dr h. Katrinka Blazing,  per echo 08/ 2021 44mm and chest CT 03/ 2021 4.0.cm   Arthritis    lower back   BPPV (benign paroxysmal positional vertigo)    Chronic back pain    CKD (chronic kidney disease), stage II    Depression    GERD (gastroesophageal reflux disease)    Headache    Hemiparesis affecting left side as late effect of cerebrovascular accident (HCC)    Hiatal hernia    History of anemia    History of cerebrovascular accident (CVA) with residual deficit 03/2006   (neurologist--- dr Frances Furbish) admission in epic, acute right thalamic hypertensive hemorrhage w/ left sided hemiparesis   History of gastric ulcer 2003   w/ upper gi bleed,  egd with no intervention needed   Hyperlipidemia    Hypertension    followed by cardiology--- dr h. Katrinka Blazing and HTN clinic   (nuclear stress test 01-31-20121 normal w/ ef 54%; cardaic cath 08-07-2010 no sig coronary obstruction, preserved LVF, mild AR   Multinodular goiter    followed by pcp,  last ultrasound in epic 03-20-2020 , never met critiria for bx   Nonrheumatic aortic (valve) insufficiency    followed by cardiologist--- dr h. Katrinka Blazing,  last echo in epic 08/ 2021 ef 60-65%, mild AR with ava 1.81cm^2 and mean gradient 11.110mmHg   OAB (overactive bladder)    urologist--- dr Sherron Monday   Pneumonia    as a child   Spondylolisthesis, lumbar region    Stroke [facility])    left side weakness 2007   Subcutaneous mass of back    upper back   Thalamic pain syndrome    secondary to acute right thalamic hypertensive hemorrhage in 2007,  followed by dr Frances Furbish   Urge incontinence  Wears glasses    Wears hearing aid in both ears    Wears partial dentures    upper and lower    PAST SURGICAL HISTORY: Past Surgical History:  Procedure Laterality Date   ABDOMINAL HYSTERECTOMY      ATRIAL FIBRILLATION ABLATION N/A 02/07/2023   Procedure: ATRIAL FIBRILLATION ABLATION;  Surgeon: Lanier Prude, MD;  Location: [facility] INVASIVE CV LAB;  Service: Cardiovascular;  Laterality: N/A;   CARDIAC CATHETERIZATION  08/07/2010   @ [facility]  dr Riley Kill---  no sig coronary obstruction, preserved LVF, mild AR   COLON RESECTION SIGMOID N/A 11/08/2021   Procedure: COLON RESECTION SIGMOID; DRAINAGE OF INTRAABDOMINAL ABSCESS;  Surgeon: Violeta Gelinas, MD;  Location: [facility] OR;  Service: General;  Laterality: N/A;   COLOSTOMY N/A 11/08/2021   Procedure: COLOSTOMY;  Surgeon: Violeta Gelinas, MD;  Location: [facility] OR;  Service: General;  Laterality: N/A;   COLOSTOMY REVERSAL N/A 05/17/2022   Procedure: COLOSTOMY REVERSAL (HARTMAN);  Surgeon: Violeta Gelinas, MD;  Location: [facility] OR;  Service: General;  Laterality: N/A;   MASS EXCISION N/A 06/04/2021   Procedure: EXCISION SUCUTANEOUS MASS X2, UPPER BACK;  Surgeon: Berna Bue, MD;  Location: [facility] OR;  Service: General;  Laterality: N/A;   TOTAL KNEE ARTHROPLASTY Left 01/05/2016   Procedure: TOTAL KNEE ARTHROPLASTY;  Surgeon: Gean Birchwood, MD;  Location: [facility] OR;  Service: Orthopedics;  Laterality: Left;   TUBAL LIGATION Bilateral 2000    FAMILY HISTORY: Family History  Problem Relation Age of Onset   Hypertension Mother        deceased   Peripheral vascular disease Mother    Cirrhosis Father        DeCEASED   Cancer Brother    Drug abuse Brother        overdose, in 1 brother   Heart attack Other        grandmother deceased   Breast cancer Neg Hx    Stroke Neg Hx     SOCIAL HISTORY: Social History   Socioeconomic History   Marital status: Divorced    Spouse name: Not on file   Number of children: 3   Years of education: 11   Highest education level: Not on file  Occupational History   Occupation: child care    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  Tobacco Use   Smoking status: Former    Current packs/day: 0.00     Average packs/day: 1 pack/day for 50.0 years (50.0 ttl pk-yrs)    Types: Cigarettes    Start date: 10/30/1971    Quit date: 10/29/2021    Years since quitting: 1.7   Smokeless tobacco: Never   Tobacco comments:    Former smoker 03/07/23  Vaping Use   Vaping status: Never Used  Substance and Sexual Activity   Alcohol use: No    Alcohol/week: 0.0 standard drinks of alcohol   Drug use: No   Sexual activity: Never  Other Topics Concern   Not on file  Social History Narrative   Patient consumes no caffeine   Social Determinants of Health   Financial Resource Strain: Low Risk  (05/16/2023)   Overall Financial Resource Strain (CARDIA)    Difficulty of Paying Living Expenses: Not hard at all  Food Insecurity: No Food Insecurity (05/16/2023)   Hunger Vital Sign    Worried About Running Out of Food in the Last Year: Never true    Ran Out of Food in the Last Year: Never true  Transportation Needs: No Transportation Needs (05/16/2023)   PRAPARE - Administrator, Civil Service (Medical): No    Lack of Transportation (Non-Medical): No  Physical Activity: Not on file  Stress: No Stress Concern Present (05/16/2023)   [employer] of Occupational Health - Occupational Stress Questionnaire    Feeling of Stress : Not at all  Social Connections: Not on file  Intimate Partner Violence: Not on file      PHYSICAL EXAM  Vitals:   08/10/23 1354  BP: 125/87  Pulse: 77  Weight: 201 lb (91.2 kg)  Height: 5\' 7"  (1.702 m)    Body mass index is 31.48 kg/m.  Generalized: Well developed, in no acute distress   Neurological examination  Mentation: Alert oriented to time, place, history taking. Follows all commands speech and language fluent Cranial nerve II-XII: Pupils were equal round reactive to light. Extraocular movements were full, visual field were full on confrontational test. Facial sensation and strength were normal.  Head turning and shoulder shrug  were normal and  symmetric. Motor: The motor testing reveals 5 over 5 strength of all 4 extremities slightly weaker in the LUE. Good symmetric motor tone is noted throughout.  Sensory: Sensory testing is intact to soft touch on all 4 extremities. No evidence of extinction is noted.  Coordination: Cerebellar testing reveals good finger-nose-finger on the right difficulty on the left. Good heel-to-shin bilaterally.  Gait and station: Gait is normal.  Uses a cane when ambulating Reflexes: Deep tendon reflexes are symmetric and normal bilaterally.   DIAGNOSTIC DATA (LABS, IMAGING, TESTING) - I reviewed patient records, labs, notes, testing and imaging myself where available.  Lab Results  Component Value Date   WBC 5.2 01/17/2023   HGB 13.4 01/17/2023   HCT 39.9 01/17/2023   MCV 88 01/17/2023   PLT 313 01/17/2023      Component Value Date/Time   NA 139 02/07/2023 0922   NA 142 01/17/2023 1403   K 4.2 02/07/2023 0922   CL 99 02/07/2023 0922   CO2 29 02/07/2023 0922   GLUCOSE 91 02/07/2023 0922   BUN 20 02/07/2023 0922   BUN 17 01/17/2023 1403   CREATININE 1.20 (H) 02/07/2023 0922   CREATININE 1.11 (H) 04/20/2016 1409   CALCIUM 9.9 02/07/2023 0922   PROT 7.3 04/16/2022 2305   PROT 6.8 08/11/2020 1058   ALBUMIN 3.5 04/16/2022 2305   ALBUMIN 3.8 08/11/2020 1058   AST 18 04/16/2022 2305   ALT 12 04/16/2022 2305   ALKPHOS 88 04/16/2022 2305   BILITOT 0.4 04/16/2022 2305   BILITOT 0.4 08/11/2020 1058   GFRNONAA 48 (L) 02/07/2023 0922   GFRAA 42 (L) 10/22/2020 1352   Lab Results  Component Value Date   CHOL 134 04/20/2016   HDL 54 04/20/2016   LDLCALC 60 04/20/2016   TRIG 99 04/20/2016   CHOLHDL 2.5 04/20/2016   Lab Results  Component Value Date   HGBA1C 6.0 (H) 11/08/2021   Lab Results  Component Value Date   VITAMINB12 2,225 (H) 02/12/2022   Lab Results  Component Value Date   TSH 1.420  12/05/2009      ASSESSMENT AND PLAN 72 y.o. year old female  has a past medical history of  Anxiety, Aortic aneurysm without rupture (HCC), Arthritis, BPPV (benign paroxysmal positional vertigo), Chronic back pain, CKD (chronic kidney disease), stage II, Depression, GERD (gastroesophageal reflux disease), Headache, Hemiparesis affecting left side as late effect of cerebrovascular accident (HCC), Hiatal hernia, History of anemia, History  of cerebrovascular accident (CVA) with residual deficit (03/2006), History of gastric ulcer (2003), Hyperlipidemia, Hypertension, Multinodular goiter, Nonrheumatic aortic (valve) insufficiency, OAB (overactive bladder), Pneumonia, Spondylolisthesis, lumbar region, Stroke [facility]), Subcutaneous mass of back, Thalamic pain syndrome, Urge incontinence, Wears glasses, Wears hearing aid in both ears, and Wears partial dentures. here with:  1.  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  Abnormality of gait and balance  --Continue gabapentin 800 mg twice a day --Advised if she begins to have frequent falls she should let us know --Follow-up in 1 year or sooner if needed     Butch Penny, MSN, NP-C 08/10/2023, 2:33 PM [facility] [address] [place], [place] [address] [phone]

## 2023-08-10 NOTE — Patient Instructions (Addendum)
Your Plan:  Continue gabapentin 800 mg twice a day     Thank you for coming to see [place] at [facility]. I hope we have been able to provide you high quality care today.  You may receive a patient satisfaction survey over the next few weeks. We would appreciate your feedback and comments so that we may continue to improve ourselves and the health of our patients.

## 2023-08-16 ENCOUNTER — Telehealth: Payer: Self-pay | Admitting: Cardiology

## 2023-08-16 DIAGNOSIS — I480 Paroxysmal atrial fibrillation: Secondary | ICD-10-CM

## 2023-08-16 MED ORDER — APIXABAN 5 MG PO TABS: 5.0000 mg | ORAL_TABLET | Freq: Two times a day (BID) | ORAL | 1 refills | Status: DC

## 2023-08-16 NOTE — Telephone Encounter (Signed)
Prescription refill request for Eliquis received. Indication: Afib  Last office visit: 05/09/23 Lalla Brothers)  Scr: 1.20 (02/07/23)  Age: 72 Weight: 91.2kg  Appropriate dose. Refill sent.

## 2023-08-16 NOTE — Telephone Encounter (Signed)
*  STAT* If patient is at the pharmacy, call can be transferred to refill team.   1. Which medications need to be refilled? (please list name of each medication and dose if known) apixaban (ELIQUIS) 5 MG TABS tablet    2. Would you like to learn more about the convenience, safety, & potential cost savings by using the [facility]?     3. Are you open to using the [facility] (Type [facility].  ).   4. Which pharmacy/location (including street and city if local pharmacy) is medication to be sent to?  [facility] - [place], [place] - [address]    5. Do they need a 30 day or 90 day supply? 90 day

## 2023-08-18 DIAGNOSIS — Z8673 Personal history of transient ischemic attack (TIA), and cerebral infarction without residual deficits: Secondary | ICD-10-CM | POA: Diagnosis not present

## 2023-08-29 ENCOUNTER — Telehealth: Payer: Self-pay | Admitting: Cardiology

## 2023-08-29 NOTE — Telephone Encounter (Signed)
*  STAT* If patient is at the pharmacy, call can be transferred to refill team.   1. Which medications need to be refilled? (please list name of each medication and dose if known) Eliquis   2. Would you like to learn more about the convenience, safety, & potential cost savings by using the [facility]?    3. Are you open to using the [facility] (Type [facility].   4. Which pharmacy/location (including street and city if local pharmacy) is medication to be sent to?[facility], [employer]. [place]   5. Do they need a 30 day or 90 day supply?  60 days# 120

## 2023-08-30 ENCOUNTER — Other Ambulatory Visit: Payer: Self-pay

## 2023-08-30 DIAGNOSIS — I480 Paroxysmal atrial fibrillation: Secondary | ICD-10-CM

## 2023-08-30 MED ORDER — APIXABAN 5 MG PO TABS: 5.0000 mg | ORAL_TABLET | Freq: Two times a day (BID) | ORAL | 1 refills | Status: DC

## 2023-08-30 NOTE — Telephone Encounter (Signed)
Prescription refill request for Eliquis received. Indication:afib Last office visit:6/24 Scr:1.20  3/24 Age: 72 Weight:91.2  kg  Prescription refilled

## 2023-09-08 DIAGNOSIS — R531 Weakness: Secondary | ICD-10-CM | POA: Diagnosis not present

## 2023-09-08 DIAGNOSIS — R251 Tremor, unspecified: Secondary | ICD-10-CM | POA: Diagnosis not present

## 2023-09-09 DIAGNOSIS — K219 Gastro-esophageal reflux disease without esophagitis: Secondary | ICD-10-CM | POA: Diagnosis not present

## 2023-09-09 DIAGNOSIS — G44209 Tension-type headache, unspecified, not intractable: Secondary | ICD-10-CM | POA: Diagnosis not present

## 2023-09-09 DIAGNOSIS — I10 Essential (primary) hypertension: Secondary | ICD-10-CM | POA: Diagnosis not present

## 2023-09-09 DIAGNOSIS — N1832 Chronic kidney disease, stage 3b: Secondary | ICD-10-CM | POA: Diagnosis not present

## 2023-09-09 DIAGNOSIS — E782 Mixed hyperlipidemia: Secondary | ICD-10-CM | POA: Diagnosis not present

## 2023-09-13 DIAGNOSIS — R251 Tremor, unspecified: Secondary | ICD-10-CM | POA: Diagnosis not present

## 2023-09-13 DIAGNOSIS — R531 Weakness: Secondary | ICD-10-CM | POA: Diagnosis not present

## 2023-09-27 DIAGNOSIS — H25813 Combined forms of age-related cataract, bilateral: Secondary | ICD-10-CM | POA: Diagnosis not present

## 2023-12-01 ENCOUNTER — Other Ambulatory Visit: Payer: Self-pay | Admitting: Cardiology

## 2023-12-01 DIAGNOSIS — I480 Paroxysmal atrial fibrillation: Secondary | ICD-10-CM

## 2023-12-01 NOTE — Telephone Encounter (Signed)
Prescription refill request for Eliquis received. Indication: Afib  Last office visit: 05/09/23 Lalla Brothers)  Scr: 1.20 (02/07/23)  Age: 72 Weight: 91.2kg  Appropriate dose. Refill sent.

## 2023-12-05 ENCOUNTER — Other Ambulatory Visit (HOSPITAL_COMMUNITY): Payer: Self-pay

## 2023-12-22 ENCOUNTER — Telehealth: Payer: Self-pay | Admitting: Physician Assistant

## 2023-12-22 ENCOUNTER — Other Ambulatory Visit: Payer: Self-pay | Admitting: *Deleted

## 2023-12-22 MED ORDER — SPIRONOLACTONE 25 MG PO TABS: 25.0000 mg | ORAL_TABLET | Freq: Every day | ORAL | 0 refills | Status: DC

## 2023-12-22 MED ORDER — METOPROLOL SUCCINATE ER 100 MG PO TB24: 150.0000 mg | ORAL_TABLET | Freq: Every day | ORAL | 0 refills | Status: DC

## 2023-12-22 NOTE — Telephone Encounter (Signed)
Spoke with pt who is requesting refills of metoprolol and spironolactone.  Pt is requesting them of Dr Lalla Brothers who saw her last for At Fib however Dr Izora Ribas is her cardiologists. Pt is due for follow up with him.   She has been scheduled for echo and CTA chest/aorta but no f/u appt with Dr Izora Ribas. Will give limited refill for these two medications as requested.

## 2023-12-22 NOTE — Telephone Encounter (Signed)
Patient called to talk with Tereso Newcomer or nurse regarding medication

## 2023-12-27 ENCOUNTER — Other Ambulatory Visit: Payer: Self-pay

## 2023-12-28 ENCOUNTER — Telehealth: Payer: Self-pay | Admitting: Internal Medicine

## 2023-12-28 DIAGNOSIS — N1832 Chronic kidney disease, stage 3b: Secondary | ICD-10-CM | POA: Diagnosis not present

## 2023-12-28 DIAGNOSIS — K219 Gastro-esophageal reflux disease without esophagitis: Secondary | ICD-10-CM | POA: Diagnosis not present

## 2023-12-28 DIAGNOSIS — G44209 Tension-type headache, unspecified, not intractable: Secondary | ICD-10-CM | POA: Diagnosis not present

## 2023-12-28 DIAGNOSIS — E782 Mixed hyperlipidemia: Secondary | ICD-10-CM | POA: Diagnosis not present

## 2023-12-28 DIAGNOSIS — I10 Essential (primary) hypertension: Secondary | ICD-10-CM | POA: Diagnosis not present

## 2023-12-28 DIAGNOSIS — Z131 Encounter for screening for diabetes mellitus: Secondary | ICD-10-CM | POA: Diagnosis not present

## 2023-12-28 DIAGNOSIS — Z23 Encounter for immunization: Secondary | ICD-10-CM | POA: Diagnosis not present

## 2023-12-28 MED ORDER — HYDRALAZINE HCL 25 MG PO TABS: 25.0000 mg | ORAL_TABLET | Freq: Three times a day (TID) | ORAL | 0 refills | Status: DC

## 2023-12-28 NOTE — Telephone Encounter (Signed)
*  STAT* If patient is at the pharmacy, call can be transferred to refill team.   1. Which medications need to be refilled? (please list name of each medication and dose if known) hydrALAZINE (APRESOLINE) 25 MG tablet   2. Which pharmacy/location (including street and city if local pharmacy) is medication to be sent to?  [facility] - [place], [place] - [address]    3. Do they need a 30 day or 90 day supply? 90  Patient only has 6 pills left.

## 2023-12-28 NOTE — Telephone Encounter (Signed)
Pt's medication was sent to pt's pharmacy as requested. Confirmation received.

## 2024-01-23 DIAGNOSIS — M47816 Spondylosis without myelopathy or radiculopathy, lumbar region: Secondary | ICD-10-CM | POA: Diagnosis not present

## 2024-01-23 DIAGNOSIS — M47817 Spondylosis without myelopathy or radiculopathy, lumbosacral region: Secondary | ICD-10-CM | POA: Diagnosis not present

## 2024-01-23 DIAGNOSIS — G8929 Other chronic pain: Secondary | ICD-10-CM | POA: Diagnosis not present

## 2024-01-24 ENCOUNTER — Other Ambulatory Visit: Payer: Self-pay | Admitting: Internal Medicine

## 2024-01-31 ENCOUNTER — Other Ambulatory Visit: Payer: Self-pay | Admitting: Internal Medicine

## 2024-02-06 ENCOUNTER — Other Ambulatory Visit: Payer: 59

## 2024-02-06 ENCOUNTER — Other Ambulatory Visit: Payer: Self-pay | Admitting: Physician Assistant

## 2024-02-06 DIAGNOSIS — Z1231 Encounter for screening mammogram for malignant neoplasm of breast: Secondary | ICD-10-CM

## 2024-02-08 ENCOUNTER — Ambulatory Visit (HOSPITAL_BASED_OUTPATIENT_CLINIC_OR_DEPARTMENT_OTHER): Payer: 59

## 2024-02-08 ENCOUNTER — Telehealth: Payer: Self-pay | Admitting: Internal Medicine

## 2024-02-08 ENCOUNTER — Other Ambulatory Visit (HOSPITAL_COMMUNITY): Payer: 59

## 2024-02-08 ENCOUNTER — Other Ambulatory Visit (HOSPITAL_BASED_OUTPATIENT_CLINIC_OR_DEPARTMENT_OTHER): Payer: 59

## 2024-02-08 DIAGNOSIS — I351 Nonrheumatic aortic (valve) insufficiency: Secondary | ICD-10-CM

## 2024-02-08 DIAGNOSIS — I480 Paroxysmal atrial fibrillation: Secondary | ICD-10-CM

## 2024-02-08 DIAGNOSIS — I719 Aortic aneurysm of unspecified site, without rupture: Secondary | ICD-10-CM

## 2024-02-08 NOTE — Telephone Encounter (Signed)
 New Message:      This patient new order for an Echo updated in the system please.

## 2024-02-08 NOTE — Telephone Encounter (Signed)
 Placed new order for echo due in March; previous order d/t expire soon.

## 2024-02-16 ENCOUNTER — Telehealth: Payer: Self-pay | Admitting: Internal Medicine

## 2024-02-16 DIAGNOSIS — I480 Paroxysmal atrial fibrillation: Secondary | ICD-10-CM

## 2024-02-16 DIAGNOSIS — I351 Nonrheumatic aortic (valve) insufficiency: Secondary | ICD-10-CM

## 2024-02-16 DIAGNOSIS — I719 Aortic aneurysm of unspecified site, without rupture: Secondary | ICD-10-CM

## 2024-02-16 NOTE — Telephone Encounter (Signed)
 New order placed for CT angio chest aorta.  Spoke with Grayling, she confirmed she had the new order. CT had to be rescheduled due to death in the family and patient requested CT to be scheduled on 4/8 (order was expiring on 3/26).  No further needs at this time.

## 2024-02-16 NOTE — Telephone Encounter (Signed)
 Cindy from [facility] is requesting a callback regarding her needing a new/updated order put in for CT ANGIO CHEST AORTA W/CM &/OR [[id number]] due to her needing to r/s pt but the order expires on 3/26 and the pt wants to come in after that. Please advise

## 2024-02-17 ENCOUNTER — Ambulatory Visit (HOSPITAL_BASED_OUTPATIENT_CLINIC_OR_DEPARTMENT_OTHER)

## 2024-03-05 ENCOUNTER — Other Ambulatory Visit (HOSPITAL_BASED_OUTPATIENT_CLINIC_OR_DEPARTMENT_OTHER)

## 2024-03-06 ENCOUNTER — Ambulatory Visit (HOSPITAL_BASED_OUTPATIENT_CLINIC_OR_DEPARTMENT_OTHER)
Admission: RE | Admit: 2024-03-06 | Discharge: 2024-03-06 | Disposition: A | Discharge status: Home / Self Care | Source: Ambulatory Visit | Attending: Internal Medicine | Admitting: Internal Medicine

## 2024-03-06 DIAGNOSIS — I480 Paroxysmal atrial fibrillation: Secondary | ICD-10-CM | POA: Insufficient documentation

## 2024-03-06 DIAGNOSIS — I7121 Aneurysm of the ascending aorta, without rupture: Secondary | ICD-10-CM | POA: Diagnosis not present

## 2024-03-06 DIAGNOSIS — I351 Nonrheumatic aortic (valve) insufficiency: Secondary | ICD-10-CM | POA: Diagnosis not present

## 2024-03-06 DIAGNOSIS — I719 Aortic aneurysm of unspecified site, without rupture: Secondary | ICD-10-CM | POA: Insufficient documentation

## 2024-03-06 DIAGNOSIS — K8020 Calculus of gallbladder without cholecystitis without obstruction: Secondary | ICD-10-CM | POA: Diagnosis not present

## 2024-03-06 LAB — POCT I-STAT CREATININE: Creatinine, Ser: 1.6 mg/dL — ABNORMAL HIGH (ref 0.44–1.00)

## 2024-03-06 MED ORDER — IOHEXOL 350 MG/ML SOLN
100.0000 mL | Freq: Once | INTRAVENOUS | Status: AC | PRN
  Administered 2024-03-06: 60 mL via INTRAVENOUS

## 2024-03-07 ENCOUNTER — Telehealth: Payer: Self-pay

## 2024-03-07 DIAGNOSIS — I10 Essential (primary) hypertension: Secondary | ICD-10-CM

## 2024-03-07 NOTE — Telephone Encounter (Signed)
-----   Message from Christell Constant sent at 03/07/2024  8:53 AM EDT ----- ISTAT creatinine elevated.   - hydration and repeat BMP in one week advised - if elevated, will need f/u with her PC-PA  Riley Lam, MD FASE [facility] Cardiologist [facility]  [address] [place], [address] [place], [place] [address] [phone]  8:54 AM ----- Message ----- From: Leory Plowman, [facility] Sent: 03/06/2024   1:52 PM EDT To: Christell Constant, MD

## 2024-03-07 NOTE — Telephone Encounter (Signed)
 The patient has been notified of the result and verbalized understanding.  All questions (if any) were answered. Macie Burows, RN 03/07/2024 4:38 PM   Provided pt information for [facility] f/u labs. Orders placed and released for future draw.

## 2024-03-08 ENCOUNTER — Ambulatory Visit
Admission: RE | Admit: 2024-03-08 | Discharge: 2024-03-08 | Disposition: A | Discharge status: Home / Self Care | Source: Ambulatory Visit | Attending: Physician Assistant | Admitting: Physician Assistant

## 2024-03-08 DIAGNOSIS — Z1231 Encounter for screening mammogram for malignant neoplasm of breast: Secondary | ICD-10-CM

## 2024-03-20 ENCOUNTER — Other Ambulatory Visit (HOSPITAL_BASED_OUTPATIENT_CLINIC_OR_DEPARTMENT_OTHER)

## 2024-03-21 ENCOUNTER — Telehealth: Payer: Self-pay

## 2024-03-21 DIAGNOSIS — I719 Aortic aneurysm of unspecified site, without rupture: Secondary | ICD-10-CM

## 2024-03-21 DIAGNOSIS — N1832 Chronic kidney disease, stage 3b: Secondary | ICD-10-CM | POA: Diagnosis not present

## 2024-03-21 DIAGNOSIS — E782 Mixed hyperlipidemia: Secondary | ICD-10-CM | POA: Diagnosis not present

## 2024-03-21 DIAGNOSIS — G44209 Tension-type headache, unspecified, not intractable: Secondary | ICD-10-CM | POA: Diagnosis not present

## 2024-03-21 DIAGNOSIS — K219 Gastro-esophageal reflux disease without esophagitis: Secondary | ICD-10-CM | POA: Diagnosis not present

## 2024-03-21 DIAGNOSIS — I10 Essential (primary) hypertension: Secondary | ICD-10-CM | POA: Diagnosis not present

## 2024-03-21 NOTE — Telephone Encounter (Signed)
 The patient has been notified of the result and verbalized understanding.  All questions (if any) were answered. Christine Cozier, RN 03/21/2024 4:06 PM

## 2024-03-21 NOTE — Telephone Encounter (Signed)
-----   Message from Jann Melody sent at 03/09/2024 12:33 PM EDT ----- Regarding: FW: She has progressed to moderate thoracic aortic aneurysm. No high-risk features were reported Radiology recommends establishing with cardiothoracic surgery. This is reasonable please refer to TCTS. ----- Message ----- From: Interface, Rad Results In Sent: 03/08/2024   1:55 PM EDT To: Jann Melody, MD

## 2024-03-26 ENCOUNTER — Telehealth: Payer: Self-pay | Admitting: Adult Health

## 2024-03-26 NOTE — Telephone Encounter (Signed)
 LVM and sent mychart msg informing pt of need to reschedule 08/15/24 appt - NP out

## 2024-03-27 ENCOUNTER — Telehealth: Payer: Self-pay | Admitting: Adult Health

## 2024-03-27 ENCOUNTER — Telehealth: Payer: Self-pay | Admitting: Cardiology

## 2024-03-27 NOTE — Telephone Encounter (Signed)
 Pt called wanting to know if she can get a note for her landlord for her to be able to get a companion pet.  Please advise.

## 2024-03-27 NOTE — Telephone Encounter (Signed)
Yes - PCP.

## 2024-03-27 NOTE — Telephone Encounter (Signed)
 Spoke with patient and she would like to know if one of you can write her a letter for a service dog for her landlord.  She went to PCP and they stated it will have to come from one of her other providers.

## 2024-03-27 NOTE — Telephone Encounter (Signed)
 Patient called in and advised that she needs a letter for an emotional support dog - she wanted to clarify

## 2024-03-27 NOTE — Telephone Encounter (Signed)
 Called pt advised of MD response:  While the Americans with Disabilities Act (ADA) does not require a prescription or letter to own or train a service dog, assessment of this is outside of my scope of practice.  If she needs a letter for this, she will need this letter from another health practitioner.   Pt reports she is followed by psychology.  Advised her to reach out to psychiatry for this concern.  As pt is requesting a social support dog.  Pt thanked me for returning call.

## 2024-03-27 NOTE — Telephone Encounter (Signed)
 Pt would like a c/b in regards to a letter for a Service Animal. Please advise

## 2024-04-02 ENCOUNTER — Telehealth: Payer: Self-pay | Admitting: Cardiology

## 2024-04-02 DIAGNOSIS — I480 Paroxysmal atrial fibrillation: Secondary | ICD-10-CM

## 2024-04-02 MED ORDER — APIXABAN 5 MG PO TABS: 5.0000 mg | ORAL_TABLET | Freq: Two times a day (BID) | ORAL | 1 refills | Status: AC

## 2024-04-02 NOTE — Telephone Encounter (Signed)
 Prescription refill request for Eliquis  received. Indication: Afib  Last office vi/sit:05/09/23 Marven Slimmer)  Scr: 1.60 (03/06/24)  Age: 73 Weight: 91.2kg  Appropriate dose. Refill sent.

## 2024-04-02 NOTE — Telephone Encounter (Signed)
*  STAT* If patient is at the pharmacy, call can be transferred to refill team.   1. Which medications need to be refilled? (please list name of each medication and dose if known) ELIQUIS  5 MG TABS tablet    2. Would you like to learn more about the convenience, safety, & potential cost savings by using the [facility]?     3. Are you open to using the [facility] (Type [facility].  ).   4. Which pharmacy/location (including street and city if local pharmacy) is medication to be sent to? [facility] - [place], [place] - [address]    5. Do they need a 30 day or 90 day supply? 90 day  Patient only has 1 pill left.

## 2024-04-05 ENCOUNTER — Telehealth (HOSPITAL_COMMUNITY): Payer: Self-pay | Admitting: Internal Medicine

## 2024-04-05 NOTE — Telephone Encounter (Signed)
 Patient cancelled echocardiogram for the reason below:  04/05/2024 3:15 PM By: WILLIS, SYLVIA M  Cancel Rsn: Patient (hurt her foot, will reschedule later)   Order will be removed form the echo WQ and if patient calls back to schedule we will reinstate order.

## 2024-04-06 ENCOUNTER — Other Ambulatory Visit (HOSPITAL_BASED_OUTPATIENT_CLINIC_OR_DEPARTMENT_OTHER)

## 2024-05-03 ENCOUNTER — Encounter

## 2024-05-09 ENCOUNTER — Encounter

## 2024-05-24 ENCOUNTER — Ambulatory Visit: Attending: Internal Medicine | Admitting: Internal Medicine

## 2024-05-24 ENCOUNTER — Encounter: Payer: Self-pay | Admitting: Internal Medicine

## 2024-05-24 VITALS — BP 128/78 | HR 64 | Ht 66.0 in

## 2024-05-24 DIAGNOSIS — I10 Essential (primary) hypertension: Secondary | ICD-10-CM | POA: Diagnosis not present

## 2024-05-24 DIAGNOSIS — I480 Paroxysmal atrial fibrillation: Secondary | ICD-10-CM | POA: Diagnosis not present

## 2024-05-24 DIAGNOSIS — I719 Aortic aneurysm of unspecified site, without rupture: Secondary | ICD-10-CM

## 2024-05-24 DIAGNOSIS — I351 Nonrheumatic aortic (valve) insufficiency: Secondary | ICD-10-CM

## 2024-05-24 MED ORDER — LOSARTAN POTASSIUM 50 MG PO TABS: 50.0000 mg | ORAL_TABLET | Freq: Every day | ORAL | 3 refills | Status: AC

## 2024-05-24 MED ORDER — METOPROLOL SUCCINATE ER 100 MG PO TB24: 150.0000 mg | ORAL_TABLET | Freq: Every day | ORAL | 3 refills | Status: AC

## 2024-05-24 MED ORDER — SPIRONOLACTONE 25 MG PO TABS: 25.0000 mg | ORAL_TABLET | Freq: Every day | ORAL | 3 refills | Status: AC

## 2024-05-24 MED ORDER — INDAPAMIDE 2.5 MG PO TABS: 2.5000 mg | ORAL_TABLET | Freq: Every day | ORAL | 3 refills | Status: AC

## 2024-05-24 MED ORDER — HYDRALAZINE HCL 25 MG PO TABS
25.0000 mg | ORAL_TABLET | Freq: Three times a day (TID) | ORAL | 3 refills | Status: AC
Start: 2024-05-24 — End: ?

## 2024-05-24 NOTE — Progress Notes (Signed)
 Cardiology Office Note:  .    Date:  05/24/2024  ID:  Andree Delphine Hint, DOB May 18, 1951, MRN [id number] PCP: Rosalea Rosina SAILOR, PA  [facility] Providers Cardiologist:  Stanly DELENA Leavens, MD Electrophysiologist:  OLE ONEIDA HOLTS, MD     CC: One year follow up  History of Present Illness: .    Chelsea [name] is a 73 y.o. female with aortic valve disease and atrial fibrillation who presents for follow-up and medication refills.  She feels generally okay but recently experienced a severe episode of vertigo, which she attributes to ear crystals. Additionally, she has noticed swelling in her feet.  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 She underwent atrial fibrillation ablation in 2024 with no issues reported since. No current atrial fibrillation episodes and her blood pressure is well-controlled.  She is on multiple medications including amlodipine , Eliquis , atorvastatin , clonidine , hydralazine , losartan , metoprolol , spironolactone , and potassium. She requests refills for losartan  and spironolactone .  She experienced a stroke in the past, resulting in weakness and tremors in her left arm, making it difficult to hold objects.  She mentions the recent loss of her ex-husband, which has been emotionally challenging.  Discussed the use of AI scribe software for clinical note transcription with the patient, who gave verbal consent to proceed.  Relevant histories: .  Social  - Former HS, CL patient ROS: As per HPI.   Studies Reviewed: .     Cardiac Studies & Procedures   ______________________________________________________________________________________________     ECHOCARDIOGRAM  ECHOCARDIOGRAM COMPLETE 02/02/2023  Narrative ECHOCARDIOGRAM REPORT    Patient Name:   Chelsea [name] Date of Exam: 02/02/2023 Medical Rec #:  [id number]            Height:       66.0 in Accession #:    [id number]          Weight:       173.0 lb Date of Birth:  1951-02-05            BSA:          1.880 m Patient Age:    72 years            BP:           130/76 mmHg Patient Gender: F                   HR:           78 bpm. Exam Location:  [address]  Procedure: 2D Echo, Cardiac Doppler and Color Doppler  Indications:    I35.1 Aortic Insufficiency  History:        Patient has prior history of Echocardiogram examinations, most recent 07/14/2022. Stroke; Risk Factors:Hypertension and Dyslipidemia.  Sonographer:    Carl Coma RDCS Referring Phys: [phone] [facility] ORN [facility]  IMPRESSIONS   1. Left ventricular ejection fraction, by estimation, is 55 to 60%. The left ventricle has normal function. The left ventricle has no regional wall motion abnormalities. Left ventricular diastolic parameters are consistent with Grade I diastolic dysfunction (impaired relaxation). 2. Right ventricular systolic function is normal. The right ventricular size is normal. 3. Left atrial size was mildly dilated. 4. The mitral valve is normal in structure. Mild to moderate mitral valve regurgitation. 5. The aortic valve is tricuspid. Aortic valve regurgitation is moderate. Aortic valve sclerosis/calcification is present, without any evidence of aortic stenosis. 6. Aortic dilatation noted. 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 7. The inferior vena cava is normal in size with greater than 50% respiratory variability, suggesting right atrial pressure of 3 mmHg.  Comparison(s): The left ventricular function is unchanged.  FINDINGS Left Ventricle: Left ventricular ejection fraction, by estimation, is 55 to 60%. The left ventricle has normal function. The left ventricle has no regional wall motion abnormalities. The left ventricular internal cavity size was normal in size. There is no left ventricular hypertrophy. Left ventricular diastolic  parameters are consistent with Grade I diastolic dysfunction (impaired relaxation).  Right Ventricle: The right ventricular size is normal. Right vetricular wall thickness was not assessed. Right ventricular systolic function is normal.  Left Atrium: Left atrial size was mildly dilated.  Right Atrium: Right atrial size was normal in size.  Pericardium: There is no evidence of pericardial effusion.  Mitral Valve: The mitral valve is normal in structure. Mild to moderate mitral valve regurgitation.  Tricuspid Valve: The tricuspid valve is normal in structure. Tricuspid valve regurgitation is mild.  Aortic Valve: The aortic valve is tricuspid. Aortic valve regurgitation is moderate. Aortic regurgitation PHT measures 359 msec. Aortic valve sclerosis/calcification is present, without any evidence of aortic stenosis. Aortic valve mean gradient measures 9.8 mmHg. Aortic valve peak gradient measures 16.3 mmHg. Aortic valve area, by VTI measures 1.65 cm.  Pulmonic Valve: The pulmonic valve was normal in structure. Pulmonic valve regurgitation is not visualized.  Aorta: The aortic root is normal in size and structure and aortic dilatation noted. There is moderate dilatation of the ascending aorta, measuring 43 mm.  Venous: The inferior vena cava is normal in size with greater than 50% respiratory variability, suggesting right atrial pressure of 3 mmHg.  IAS/Shunts: No atrial level shunt detected by color flow Doppler.   LEFT VENTRICLE PLAX 2D LVIDd:         5.10 cm   Diastology LVIDs:         3.70 cm   LV e' medial:    3.65 cm/s LV PW:         0.70 cm   LV E/e' medial:  16.3 LV IVS:        0.70 cm   LV e' lateral:   5.92 cm/s LVOT diam:     2.00 cm   LV E/e' lateral: 10.1 LV SV:         72 LV SV Index:   38 LVOT Area:     3.14 cm   RIGHT VENTRICLE             IVC RV Basal diam:  3.10 cm     IVC diam: 0.90 cm RV S prime:     13.50 cm/s TAPSE (M-mode): 2.4 cm  LEFT ATRIUM              Index        RIGHT ATRIUM           Index LA diam:        4.50 cm 2.39 cm/m   RA Area:     16.10 cm LA Vol (A2C):   59.1 ml 31.43 ml/m  RA Volume:   44.00 ml  23.40 ml/m LA Vol (A4C):   62.4 ml 33.19 ml/m LA Biplane Vol: 60.9 ml 32.39 ml/m AORTIC VALVE AV Area (Vmax):    1.70 cm AV Area (Vmean):   1.65 cm AV Area (VTI):     1.65 cm AV Vmax:  201.80 cm/s AV Vmean:          146.600 cm/s AV VTI:            0.436 m AV Peak Grad:      16.3 mmHg AV Mean Grad:      9.8 mmHg LVOT Vmax:         109.33 cm/s LVOT Vmean:        77.067 cm/s LVOT VTI:          0.230 m LVOT/AV VTI ratio: 0.53 AI PHT:            359 msec  AORTA Ao Root diam: 2.70 cm Ao Asc diam:  4.30 cm  MITRAL VALVE                TRICUSPID VALVE MV Area (PHT): 3.03 cm     TR Peak grad:   21.9 mmHg MV Decel Time: 250 msec     TR Vmax:        234.00 cm/s MV E velocity: 59.50 cm/s MV A velocity: 104.00 cm/s  SHUNTS MV E/A ratio:  0.57         Systemic VTI:  0.23 m Systemic Diam: 2.00 cm  Vina Gull MD Electronically signed by Vina Gull MD Signature Date/Time: 02/02/2023/10:02:00 PM    Final    MONITORS  CARDIAC EVENT MONITOR 08/06/2022       ______________________________________________________________________________________________         Physical Exam:    VS:  BP 128/78 (BP Location: Right Arm)   Pulse 64   Ht 5' 6 (1.676 m)   SpO2 95%   BMI 32.44 kg/m    Wt Readings from Last 3 Encounters:  08/10/23 201 lb (91.2 kg)  05/09/23 189 lb (85.7 kg)  03/07/23 182 lb 9.6 oz (82.8 kg)    Gen: no distress   Neck: No JVD Cardiac: No Rubs or Gallops, holodiastolic murmur, RRR +2 radial pulses Respiratory: Clear to auscultation bilaterally, normal effort, normal  respiratory rate GI: Soft, nontender, non-distended  MS: No  edema; Limited left arm movement with spasm, controlled gait with rollator walker Integument: Skin feels warm Neuro:  At time of evaluation, alert and oriented to  person/place/time/situation  Psych: Normal affect, patient feels ok   ASSESSMENT AND PLAN: .    Paroxysmal atrial fibrillation Previously treated with ablation and currently on Eliquis  for stroke prevention. No current episodes reported. - Continue Eliquis  for stroke prevention - Perform EKG today  Nonrheumatic aortic valve disorders Moderate aortic regurgitation with aortic dilation. Echocardiogram in March 2024 showed moderate aortic insufficiency. No current need for surgical intervention, but open heart surgery may be considered if condition worsens. Avoidance of fluoroquinolones due to aortic dilation was advised. - Order echocardiogram to assess aortic valve and aortic dilation - Avoid fluoroquinolones due to aortic dilation - discussed surgical intervention if needed - has LVH with inferior infarct pattern on EKG with no sx; will get echo to r/u WMA  Stroke with residual left arm weakness Residual weakness in the left arm. Currently on Eliquis  to reduce the risk of recurrent stroke. - Continue Eliquis  for stroke prevention  Hypertension Well-controlled on current medication regimen. Blood pressure today is 128/78 mmHg. - Refill antihypertensive medications including losartan , amlodipine , hydralazine , and spironolactone , is ok K despite MRA will get BMP - rest of regimen as per primary  One year f/u with my team unless severe AI or severe aneurysm    Stanly Leavens, MD FASE [facility] Cardiologist [facility]  [facility]  [address] [place], [place] [address] [phone]  1:55 PM

## 2024-05-24 NOTE — Patient Instructions (Signed)
 Medication Instructions:  Your physician recommends that you continue on your current medications as directed. Please refer to the Current Medication list given to you today.  *If you need a refill on your cardiac medications before your next appointment, please call your pharmacy*  Lab Work: TODAY at [facility] on the 1st floor  If you have labs (blood work) drawn today and your tests are completely normal, you will receive your results only by: MyChart Message (if you have MyChart) OR A paper copy in the mail If you have any lab test that is abnormal or we need to change your treatment, we will call you to review the results.  Testing/Procedures: Your physician has requested that you have an echocardiogram. Echocardiography is a painless test that uses sound waves to create images of your heart. It provides your doctor with information about the size and shape of your heart and how well your heart's chambers and valves are working. This procedure takes approximately one hour. There are no restrictions for this procedure. Please do NOT wear cologne, perfume, aftershave, or lotions (deodorant is allowed). Please arrive 15 minutes prior to your appointment time.  Please note: We ask at that you not bring children with you during ultrasound (echo/ vascular) testing. Due to room size and safety concerns, children are not allowed in the ultrasound rooms during exams. Our front office staff cannot provide observation of children in our lobby area while testing is being conducted. An adult accompanying a patient to their appointment will only be allowed in the ultrasound room at the discretion of the ultrasound technician under special circumstances. We apologize for any inconvenience.   Follow-Up: At [facility], you and your health needs are our priority.  As part of our continuing mission to provide you with exceptional heart care, our providers are all part of one team.  This team  includes your primary Cardiologist (physician) and Advanced Practice Providers or APPs (Physician Assistants and Nurse Practitioners) who all work together to provide you with the care you need, when you need it.  Your next appointment:   1 year(s)  Provider:   One of our Advanced Practice Providers (APPs): Morse Clause, PA-C  Lamarr Satterfield, NP Miriam Shams, NP  Olivia Pavy, PA-C Josefa Beauvais, NP  Leontine Salen, PA-C Orren Fabry, PA-C  [place], [place] Jackee Alberts, NP  Damien Braver, NP Jon Hails, PA-C  Waddell Donath, PA-C    Dayna Dunn, PA-C  Glendia Ferrier, PA-C Lum Louis, NP Katlyn West, NP Callie Goodrich, PA-C  Evan Williams, PA-C Sheng Haley, PA-C  Xika Zhao, NP Kathleen Johnson, PA-C

## 2024-05-25 ENCOUNTER — Ambulatory Visit: Payer: Self-pay

## 2024-05-25 DIAGNOSIS — I719 Aortic aneurysm of unspecified site, without rupture: Secondary | ICD-10-CM

## 2024-05-25 DIAGNOSIS — I351 Nonrheumatic aortic (valve) insufficiency: Secondary | ICD-10-CM

## 2024-05-25 LAB — BASIC METABOLIC PANEL WITH GFR
BUN/Creatinine Ratio: 11 — ABNORMAL LOW (ref 12–28)
BUN: 16 mg/dL (ref 8–27)
CO2: 23 mmol/L (ref 20–29)
Calcium: 9.5 mg/dL (ref 8.7–10.3)
Chloride: 104 mmol/L (ref 96–106)
Creatinine, Ser: 1.4 mg/dL — ABNORMAL HIGH (ref 0.57–1.00)
Glucose: 74 mg/dL (ref 70–99)
Potassium: 4.1 mmol/L (ref 3.5–5.2)
Sodium: 143 mmol/L (ref 134–144)
eGFR: 40 mL/min/{1.73_m2} — ABNORMAL LOW (ref >59)

## 2024-06-05 ENCOUNTER — Ambulatory Visit: Attending: Thoracic Surgery (Cardiothoracic Vascular Surgery)

## 2024-06-05 VITALS — BP 148/80 | HR 80 | Resp 20 | Ht 66.0 in | Wt 188.0 lb

## 2024-06-05 DIAGNOSIS — I712 Thoracic aortic aneurysm, without rupture, unspecified: Secondary | ICD-10-CM | POA: Diagnosis not present

## 2024-06-05 NOTE — Patient Instructions (Signed)

## 2024-06-05 NOTE — Progress Notes (Addendum)
 [address] [place] [address]             [phone]            Majestic Molony [id number] 06-16-1951   History of Present Illness:  Mrs. Chelsea [name] is a 73 year old female with past medical history of hypertension, aortic regurgitation, hyperlipidemia, and a fib.  She presents today for follow up of thoracic aortic aneurysm.  She was last seen on 02/23/2022 where CTA showed aneurysm measuring 4.4 cm.  Recent CTA scan on 03/06/2024 showed increase in size to 4.6 cm.  She is a former smoker and quit in 2024.  She denies chest and back pain.      Current Outpatient Medications on File Prior to Visit  Medication Sig Dispense Refill   amLODipine  (NORVASC ) 10 MG tablet Take 1 tablet (10 mg total) by mouth daily. 90 tablet 2   apixaban  (ELIQUIS ) 5 MG TABS tablet Take 1 tablet (5 mg total) by mouth 2 (two) times daily. 180 tablet 1   Ascorbic Acid (VITAMIN C) 1000 MG tablet Take 1,000 mg by mouth in the morning.     atorvastatin  (LIPITOR) 10 MG tablet Take 1 tablet (10 mg total) by mouth daily. 90 tablet 2   b complex vitamins capsule Take 1 capsule by mouth in the morning.     Biotin 5000 MCG TABS Take 5,000 mcg by mouth in the morning.     cholecalciferol (VITAMIN D) 25 MCG (1000 UNIT) tablet Take 1,000 Units by mouth daily as needed (with vertigo).     citalopram  (CELEXA ) 20 MG tablet Take 20 mg by mouth at bedtime.     cloNIDine  (CATAPRES ) 0.3 MG tablet Take 0.3 mg by mouth 2 (two) times daily.     cyclobenzaprine (FLEXERIL) 10 MG tablet Take 10 mg by mouth as needed for muscle spasms.     gabapentin  (NEURONTIN ) 800 MG tablet Take 1 tablet (800 mg total) by mouth 2 (two) times daily. 180 tablet 3   hydrALAZINE  (APRESOLINE ) 25 MG tablet Take 1 tablet (25 mg total) by mouth 3 (three) times daily. 270 tablet 3   indapamide  (LOZOL ) 2.5 MG tablet Take 1 tablet (2.5 mg total) by mouth daily. 90 tablet 3   losartan  (COZAAR ) 50 MG tablet Take 1 tablet  (50 mg total) by mouth daily. 90 tablet 3   meclizine  (ANTIVERT ) 25 MG tablet Take 25 mg by mouth 3 (three) times daily as needed (vertigo).     methocarbamol  (ROBAXIN ) 500 MG tablet 2 tab(s) orally 3 times a day AS NEEDED     metoprolol  succinate (TOPROL -XL) 100 MG 24 hr tablet Take 1.5 tablets (150 mg total) by mouth daily. Take with or immediately following a meal. 135 tablet 3   Multiple Vitamin (MULTIVITAMIN WITH MINERALS) TABS tablet Take 1 tablet by mouth in the morning. 50+     naproxen (NAPROSYN) 375 MG tablet Take 375 mg by mouth 2 (two) times daily as needed.     ondansetron  (ZOFRAN ) 8 MG tablet Take 8 mg by mouth 3 (three) times daily as needed for nausea or vomiting.     pantoprazole  (PROTONIX ) 40 MG tablet Take 40 mg by mouth daily before breakfast.     potassium chloride  SA (KLOR-CON  M20) 20 MEQ tablet Take 1 tablet (20 mEq total) by mouth 2 (two) times daily. (Patient taking differently: Take 20 mEq by mouth daily.) 180 tablet  3   spironolactone  (ALDACTONE ) 25 MG tablet Take 1 tablet (25 mg total) by mouth daily. Needs appt with Dr Santo for further refills 90 tablet 3   traMADol  (ULTRAM ) 50 MG tablet Take 1 tablet (50 mg total) by mouth every 6 (six) hours as needed for severe pain. 25 tablet 1   Vitamin A 2400 MCG (8000 UT) CAPS Take 2,400 mcg by mouth in the morning.     vitamin B-12 (CYANOCOBALAMIN ) 1000 MCG tablet Take 1,000 mcg by mouth 2 (two) times a week.     vitamin E 180 MG (400 UNITS) capsule Take 400 Units by mouth in the morning.     No current facility-administered medications on file prior to visit.     ROS: Review of Systems  Respiratory: Negative.    Cardiovascular: Negative.  Negative for chest pain.  Skin: Negative.   Neurological: Negative.   All other systems reviewed and are negative.    BP (!) 148/80   Pulse 80   Resp 20   Ht 5' 6 (1.676 m)   Wt 188 lb (85.3 kg)   SpO2 96% Comment: RA  BMI 30.34 kg/m   Physical  Exam Constitutional:      Appearance: Normal appearance.  HENT:     Head: Normocephalic and atraumatic.  Cardiovascular:     Rate and Rhythm: Normal rate and regular rhythm.     Heart sounds: S1 normal and S2 normal. Murmur heard.  Pulmonary:     Breath sounds: Normal breath sounds.  Skin:    General: Skin is warm and dry.  Neurological:     Mental Status: She is alert.      Imaging: CLINICAL DATA:  Thoracic aortic aneurysm.   EXAM: CT ANGIOGRAPHY CHEST WITH CONTRAST   TECHNIQUE: Multidetector CT imaging of the chest was performed using the standard protocol during bolus administration of intravenous contrast. Multiplanar CT image reconstructions and MIPs were obtained to evaluate the vascular anatomy.   RADIATION DOSE REDUCTION: This exam was performed according to the departmental dose-optimization program which includes automated exposure control, adjustment of the mA and/or kV according to patient size and/or use of iterative reconstruction technique.   CONTRAST:  60mL OMNIPAQUE  IOHEXOL  350 MG/ML SOLN   COMPARISON:  01/26/2022.   FINDINGS: Cardiovascular: Preferential opacification of the thoracic aorta. Ascending thoracic aorta aneurysm maximal diameter is 4.6 cm compared to 4.4 cm previously. No dissection. Normal heart size. No pericardial effusion.   Mediastinum/Nodes: No enlarged mediastinal, hilar, or axillary lymph nodes. Thyroid  gland, trachea, and esophagus demonstrate no significant findings.   Lungs/Pleura: Lungs are clear. No pleural effusion or pneumothorax.   Upper Abdomen: Calcified gallstone.  Otherwise no acute abnormality.   Musculoskeletal: No chest wall abnormality. No acute or significant osseous findings.   Review of the MIP images confirms the above findings.   IMPRESSION: 1. 4.6 cm ascending thoracic aortic aneurysm. Recommend follow-up every 6 months and vascular consultation. 2. Cholelithiasis.     Electronically Signed    By: Fonda Field M.D.   On: 03/08/2024 13:52   ECHOCARDIOGRAM REPORT       Patient Name:   Chelsea [name] Date of Exam: 02/02/2023  Medical Rec #:  [id number]           Height:       66.0 in  Accession #:    [id number]          Weight:       173.0 lb  Date of Birth:  02-11-1951            BSA:          1.880 m  Patient Age:    72 years            BP:           130/76 mmHg  Patient Gender: F                   HR:           78 bpm.  Exam Location:  [address]   Procedure: 2D Echo, Cardiac Doppler and Color Doppler   Indications:    I35.1 Aortic Insufficiency    History:        Patient has prior history of Echocardiogram examinations,  most                 recent 07/14/2022. Stroke; Risk Factors:Hypertension and                  Dyslipidemia.    Sonographer:    Carl Coma RDCS  Referring Phys: [id number] [facility]   IMPRESSIONS     1. Left ventricular ejection fraction, by estimation, is 55 to 60%. The  left ventricle has normal function. The left ventricle has no regional  wall motion abnormalities. Left ventricular diastolic parameters are  consistent with Grade I diastolic  dysfunction (impaired relaxation).   2. Right ventricular systolic function is normal. The right ventricular  size is normal.   3. Left atrial size was mildly dilated.   4. The mitral valve is normal in structure. Mild to moderate mitral valve  regurgitation.   5. The aortic valve is tricuspid. Aortic valve regurgitation is moderate.  Aortic valve sclerosis/calcification is present, without any evidence of  aortic stenosis.   6. Aortic dilatation noted. There is moderate dilatation of the ascending  aorta, measuring 43 mm.   7. The inferior vena cava is normal in size with greater than 50%  respiratory variability, suggesting right atrial pressure of 3 mmHg.   Comparison(s): The left ventricular function is unchanged.   FINDINGS   Left Ventricle: Left ventricular ejection  fraction, by estimation, is 55  to 60%. The left ventricle has normal function. The left ventricle has no  regional wall motion abnormalities. The left ventricular internal cavity  size was normal in size. There is   no left ventricular hypertrophy. Left ventricular diastolic parameters  are consistent with Grade I diastolic dysfunction (impaired relaxation).   Right Ventricle: The right ventricular size is normal. Right vetricular  wall thickness was not assessed. Right ventricular systolic function is  normal.   Left Atrium: Left atrial size was mildly dilated.   Right Atrium: Right atrial size was normal in size.   Pericardium: There is no evidence of pericardial effusion.   Mitral Valve: The mitral valve is normal in structure. Mild to moderate  mitral valve regurgitation.   Tricuspid Valve: The tricuspid valve is normal in structure. Tricuspid  valve regurgitation is mild.   Aortic Valve: The aortic valve is tricuspid. Aortic valve regurgitation is  moderate. Aortic regurgitation PHT measures 359 msec. Aortic valve  sclerosis/calcification is present, without any evidence of aortic  stenosis. Aortic valve mean gradient measures   9.8 mmHg. Aortic valve peak gradient measures 16.3 mmHg. Aortic valve  area, by VTI measures 1.65 cm.   Pulmonic Valve: The pulmonic valve was normal in structure. Pulmonic valve  regurgitation is not visualized.  Aorta: The aortic root is normal in size and structure and aortic  dilatation noted. There is moderate dilatation of the ascending aorta,  measuring 43 mm.   Venous: The inferior vena cava is normal in size with greater than 50%  respiratory variability, suggesting right atrial pressure of 3 mmHg.   IAS/Shunts: No atrial level shunt detected by color flow Doppler.     LEFT VENTRICLE  PLAX 2D  LVIDd:         5.10 cm   Diastology  LVIDs:         3.70 cm   LV e' medial:    3.65 cm/s  LV PW:         0.70 cm   LV E/e' medial:  16.3   LV IVS:        0.70 cm   LV e' lateral:   5.92 cm/s  LVOT diam:     2.00 cm   LV E/e' lateral: 10.1  LV SV:         72  LV SV Index:   38  LVOT Area:     3.14 cm     RIGHT VENTRICLE             IVC  RV Basal diam:  3.10 cm     IVC diam: 0.90 cm  RV S prime:     13.50 cm/s  TAPSE (M-mode): 2.4 cm   LEFT ATRIUM             Index        RIGHT ATRIUM           Index  LA diam:        4.50 cm 2.39 cm/m   RA Area:     16.10 cm  LA Vol (A2C):   59.1 ml 31.43 ml/m  RA Volume:   44.00 ml  23.40 ml/m  LA Vol (A4C):   62.4 ml 33.19 ml/m  LA Biplane Vol: 60.9 ml 32.39 ml/m   AORTIC VALVE  AV Area (Vmax):    1.70 cm  AV Area (Vmean):   1.65 cm  AV Area (VTI):     1.65 cm  AV Vmax:           201.80 cm/s  AV Vmean:          146.600 cm/s  AV VTI:            0.436 m  AV Peak Grad:      16.3 mmHg  AV Mean Grad:      9.8 mmHg  LVOT Vmax:         109.33 cm/s  LVOT Vmean:        77.067 cm/s  LVOT VTI:          0.230 m  LVOT/AV VTI ratio: 0.53  AI PHT:            359 msec    AORTA  Ao Root diam: 2.70 cm  Ao Asc diam:  4.30 cm   MITRAL VALVE                TRICUSPID VALVE  MV Area (PHT): 3.03 cm     TR Peak grad:   21.9 mmHg  MV Decel Time: 250 msec     TR Vmax:        234.00 cm/s  MV E velocity: 59.50 cm/s  MV A velocity: 104.00 cm/s  SHUNTS  MV E/A ratio:  0.57  Systemic VTI:  0.23 m                              Systemic Diam: 2.00 cm   Vina Gull MD  Electronically signed by Vina Gull MD  Signature Date/Time: 02/02/2023/10:02:00 PM   A/P:  Thoracic aortic aneurysm without rupture, unspecified part (HCC) -Thoracic aortic aneurysm has increased from 4.4 cm to 4.6 cm over the past 2 years. Since this has increased will change screening to every 6 months.   -Hypertension is controlled and she follows up with cardiology regularly.  She does have as slight increase in blood pressure today at 148/80. Patient does report that she checks her BP at home and gets readings of  130s/80s.  She is to report readings over 140s/90s to her cardiologist for management. -Follow up in 6 months for CTA scan for thoracic aortic aneurysm     Risk Modification:  Statin:  Pravastatin 10 Mg taken daily  Smoking cessation instruction/counseling given:  commended patient for quitting and reviewed strategies for preventing relapses  Patient was counseled on importance of Blood Pressure Control. Do not ever stop blood pressure medications on your own, unless instructed by healthcare professional.  Please avoid use of Fluoroquinolones as this can potentially increase your risk of Aortic Rupture and/or Dissection  Patient educated on signs and symptoms of Aortic Dissection, handout also provided in AVS  Chelsea CHRISTELLA Rough, PA-C 06/05/24

## 2024-06-07 ENCOUNTER — Telehealth: Payer: Self-pay | Admitting: Internal Medicine

## 2024-06-07 NOTE — Telephone Encounter (Signed)
 Caller Denson) is returning Pre-op call.

## 2024-06-07 NOTE — Telephone Encounter (Signed)
   Pre-operative Risk Assessment    Patient Name: Chelsea [name]  DOB: 1951-04-08 MRN: [id number]   Date of last office visit: 05/24/24 DR SANTO Date of next office visit: NONE  Request for Surgical Clearance    Procedure:  PERIO SCALING ROOT PLANING  Date of Surgery:  Clearance 06/11/24                                Surgeon:  EARLE MOATS DDS Surgeon's Group or Practice Name:  [facility] Phone number:  [phone] Fax number:  [phone]   Type of Clearance Requested:   - Medical  - Pharmacy:  Hold Apixaban  (Eliquis ) NOT INDICATED   Type of Anesthesia:  Local    Additional requests/questions:    Signed, Krina Mraz   06/07/2024, 2:39 PM

## 2024-06-08 NOTE — Telephone Encounter (Signed)
   Patient Name: Chelsea [name]  DOB: [birth year] MRN: [id number]  Primary Cardiologist: Stanly DELENA Leavens, MD  Chart reviewed as part of pre-operative protocol coverage.   Simple dental extractions (i.e. 1-2 teeth) are considered low risk procedures per guidelines and generally do not require any specific cardiac clearance. It is also generally accepted that for simple extractions and dental cleanings, there is no need to interrupt blood thinner therapy.  SBE prophylaxis is not required for the patient from a cardiac standpoint.  I will route this recommendation to the requesting party via Epic fax function and remove from pre-op pool.  Please call with questions.  Lewie Deman, [place] 06/08/2024, 11:31 AM

## 2024-06-19 ENCOUNTER — Telehealth: Payer: Self-pay | Admitting: *Deleted

## 2024-06-19 NOTE — Telephone Encounter (Signed)
   Pre-operative Risk Assessment    Patient Name: Chelsea [name]  DOB: [birth year] MRN: [id number]   Date of last office visit: 05/24/24 DR. CHANDRASEKHAR  Date of next office visit: NONE  Request for Surgical Clearance    Procedure:  MAXILLARY TUBEROSITY BONE REDUCTIONS B/L  Date of Surgery:  Clearance TBD                                Surgeon:  DR. DEWARD CONGRESS DMD Surgeon's Group or Practice Name:  [facility] Phone number:  [phone] Fax number:  [phone]   Type of Clearance Requested:   - Medical  - Pharmacy:  Hold Apixaban  (Eliquis )     Type of Anesthesia:  Local  WITH EPI   Additional requests/questions:    [name]   06/19/2024, 11:48 AM

## 2024-06-22 NOTE — Telephone Encounter (Signed)
 Yes patient will need VV appt

## 2024-06-22 NOTE — Telephone Encounter (Signed)
 Patient with diagnosis of atrial fibrillation on Eliquis  for anticoagulation.    Procedure:  MAXILLARY TUBEROSITY BONE REDUCTIONS B/L   Date of Surgery:  Clearance TBD     CHA2DS2-VASc Score = 5   This indicates a 7.2% annual risk of stroke. The patient's score is based upon: CHF History: 0 HTN History: 1 Diabetes History: 0 Stroke History: 2 Vascular Disease History: 0 Age Score: 1 Gender Score: 1    CrCl 52 Platelet count no current lab  Patient has not had an Afib/aflutter ablation within the last 3 months or DCCV within the last 30 days  Will need updated CBC for clearance  **This guidance is not considered finalized until pre-operative APP has relayed final recommendations.**

## 2024-06-22 NOTE — Telephone Encounter (Signed)
 I s/w the pt and she tells me her procedure being post poned until sometime in the beginning of the yr 2026 due to her insurance, not enough coverage per the pt. I asked the pt when she is ready to reschedule the dental work to have DDS send new request. Pt agreed to plan of care.

## 2024-06-22 NOTE — Telephone Encounter (Signed)
 Patient needs CBC and BMET for Pharmacy clearance to hold Eliquis  please

## 2024-06-22 NOTE — Telephone Encounter (Signed)
 I will forward back to preop as the preop APP noted for pharmacy clearance; however the request is for both medical and pharm clearance. I will need to confirm if pt is going to need an appt.

## 2024-06-27 DIAGNOSIS — I10 Essential (primary) hypertension: Secondary | ICD-10-CM | POA: Diagnosis not present

## 2024-06-27 DIAGNOSIS — N3281 Overactive bladder: Secondary | ICD-10-CM | POA: Diagnosis not present

## 2024-06-27 DIAGNOSIS — Z0000 Encounter for general adult medical examination without abnormal findings: Secondary | ICD-10-CM | POA: Diagnosis not present

## 2024-06-27 DIAGNOSIS — N1832 Chronic kidney disease, stage 3b: Secondary | ICD-10-CM | POA: Diagnosis not present

## 2024-06-27 DIAGNOSIS — K219 Gastro-esophageal reflux disease without esophagitis: Secondary | ICD-10-CM | POA: Diagnosis not present

## 2024-06-27 DIAGNOSIS — E782 Mixed hyperlipidemia: Secondary | ICD-10-CM | POA: Diagnosis not present

## 2024-06-27 DIAGNOSIS — G44209 Tension-type headache, unspecified, not intractable: Secondary | ICD-10-CM | POA: Diagnosis not present

## 2024-07-11 ENCOUNTER — Ambulatory Visit (HOSPITAL_COMMUNITY)
Admission: RE | Admit: 2024-07-11 | Discharge: 2024-07-11 | Disposition: A | Discharge status: Home / Self Care | Source: Ambulatory Visit | Attending: Cardiology | Admitting: Cardiology

## 2024-07-11 DIAGNOSIS — I10 Essential (primary) hypertension: Secondary | ICD-10-CM

## 2024-07-11 DIAGNOSIS — I719 Aortic aneurysm of unspecified site, without rupture: Secondary | ICD-10-CM

## 2024-07-11 DIAGNOSIS — I480 Paroxysmal atrial fibrillation: Secondary | ICD-10-CM | POA: Diagnosis not present

## 2024-07-11 DIAGNOSIS — I351 Nonrheumatic aortic (valve) insufficiency: Secondary | ICD-10-CM | POA: Diagnosis not present

## 2024-07-11 LAB — ECHOCARDIOGRAM COMPLETE
AR max vel: 1.57 cm2
AV Area VTI: 1.68 cm2
AV Area mean vel: 1.63 cm2
AV Mean grad: 9 mmHg
AV Peak grad: 17.9 mmHg
Ao pk vel: 2.12 m/s
Area-P 1/2: 2.94 cm2
P 1/2 time: 507 ms
S' Lateral: 3.6 cm

## 2024-07-16 NOTE — Addendum Note (Signed)
 Addended by: RANDY HAMP SAILOR on: 07/16/2024 02:25 PM   Modules accepted: Orders

## 2024-07-23 DIAGNOSIS — M47816 Spondylosis without myelopathy or radiculopathy, lumbar region: Secondary | ICD-10-CM | POA: Diagnosis not present

## 2024-08-08 DIAGNOSIS — N1832 Chronic kidney disease, stage 3b: Secondary | ICD-10-CM | POA: Diagnosis not present

## 2024-08-08 DIAGNOSIS — K219 Gastro-esophageal reflux disease without esophagitis: Secondary | ICD-10-CM | POA: Diagnosis not present

## 2024-08-08 DIAGNOSIS — E782 Mixed hyperlipidemia: Secondary | ICD-10-CM | POA: Diagnosis not present

## 2024-08-08 DIAGNOSIS — N3281 Overactive bladder: Secondary | ICD-10-CM | POA: Diagnosis not present

## 2024-08-08 DIAGNOSIS — G44209 Tension-type headache, unspecified, not intractable: Secondary | ICD-10-CM | POA: Diagnosis not present

## 2024-08-08 DIAGNOSIS — I10 Essential (primary) hypertension: Secondary | ICD-10-CM | POA: Diagnosis not present

## 2024-08-15 ENCOUNTER — Ambulatory Visit: Payer: 59 | Admitting: Adult Health

## 2024-09-06 DIAGNOSIS — I5040 Unspecified combined systolic (congestive) and diastolic (congestive) heart failure: Secondary | ICD-10-CM | POA: Diagnosis not present

## 2024-09-06 DIAGNOSIS — I480 Paroxysmal atrial fibrillation: Secondary | ICD-10-CM | POA: Diagnosis not present

## 2024-09-06 DIAGNOSIS — N1831 Chronic kidney disease, stage 3a: Secondary | ICD-10-CM | POA: Diagnosis not present

## 2024-09-06 DIAGNOSIS — I7121 Aneurysm of the ascending aorta, without rupture: Secondary | ICD-10-CM | POA: Diagnosis not present

## 2024-09-06 DIAGNOSIS — I129 Hypertensive chronic kidney disease with stage 1 through stage 4 chronic kidney disease, or unspecified chronic kidney disease: Secondary | ICD-10-CM | POA: Diagnosis not present

## 2024-09-09 NOTE — Progress Notes (Unsigned)
 PATIENT: Chelsea [name] DOB: [birth year]  REASON FOR VISIT: follow up HISTORY FROM: patient PRIMARY NEUROLOGIST: Dr. Buck  No chief complaint on file.    HISTORY OF PRESENT ILLNESS: Today 09/09/24:  08/10/23: Chelsea [name] is a 73 y.o. 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  She reports that gabapentin  800 mg twice a day is still working fairly well for her.  Denies any significant changes with her gait or balance.  Is using a cane today when ambulating.denies any falls.  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  She returns today for follow-up.  Reports that Pain varies day to day. Continues gabapentin  800 mg BID. Still using rollator. No falls.   Since last visit had an intestine inflammation and had a colostomy bag but that has since been removed.  Reports that they also found an aneurysm on her aorta that they are monitoring for now.  She reports that her family is insisting that she live somewhere with more visibility on her.  They do not like her living alone.  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  She returns today for follow-up.  She reports that her symptoms have remained stable.  Continues to have weakness on the left side.  Reports that gabapentin  800 mg twice a day controls her discomfort.  Reports that she did complete physical therapy.  She states that she had 2 falls in July but fortunately did not suffer any injuries.  She does use a Rollator when ambulating.  She returns today for an evaluation.  HISTORY  08/11/2020: She reports that her weakness has been stable on the left side since 2007.  She does report that when she is stressed out or upset, she feels that her numbness gets worse on the left side.  She continues to take gabapentin  800 mg twice daily for  discomfort on the left side.  She has not been using her ankle brace on the left, she may have misplaced it but will look for it.  She is trying to quit smoking but continues to smoke approximately a pack per day currently.  She has seen cardiology and has been started on metoprolol .  She was told she had a aortic aneurysm that needs to be monitored.  She may need surgery eventually for this.  She saw Dr. Claudene in cardiology on 08/08/2020 and I reviewed the note.    She had a brain MRI w/wo contrast on 11/19/08 and I reviewed the results:  IMPRESSION:  1.  No evidence of acute ischemia.  2.  Stable non specific supratentorial subcortical white matter  changes most likely representing areas of ischemic gliosis due to  small vessel disease related to hypertension or diabetes.  3.  Interval near-complete resolution of the previous right  thalamic hemorrhage.  4.  Small focal area of enhancement within the epicenter of the  hemorrhage, which probably represents contrast pooling.  No  evidence of a vascular abnormality however is suggested on the MRI  examination.   She had a brain MRI with and without contrast, MRA head without contrast and MRA neck with and without contrast through [facility] on 10/03/2009 and I reviewed the results:   Conclusion:  Linear area of postcontrast enhancement in the right thalamus more  likely represents granulation tissue  from prior hemorrhage than a  cavernoma given its appearance. The peripheral area of low T2 signal  intensity is most consistent with hemosiderin deposition of remote  blood products.  If outside MRI is available for comparison this  would be helpful to more accurately characterize the chronicity of  this process.   Slight decrease perfusion in the left watershed supratentorial brain  without stenosis identified in the arterial circulation of the neck  or brain. This may be secondary to asymmetric artifact in the study  technique.   Scattered  foci of T2 signal hyperintensity in the supratentorial  white matter greater on the right than left which are nonspecific but  most likely related to chronic small vessel ischemic disease.   No acute infarct.   Unremarkable MRA of the intracranial and cervical vessels.      The patient's allergies, current medications, family history, past medical history, past social history, past surgical history and problem list were reviewed and updated as appropriate.     REVIEW OF SYSTEMS: Out of a complete 14 system review of symptoms, the patient complains only of the following symptoms, and all other reviewed systems are negative.  ALLERGIES: Allergies  Allergen Reactions   Other Swelling    Hair dye, caused eye swelling, multiple times experienced this reaction   Hydrocodone Nausea And Vomiting   Oxycodone  Nausea And Vomiting    Other reaction(s): Unknown    HOME MEDICATIONS: Outpatient Medications Prior to Visit  Medication Sig Dispense Refill   amLODipine  (NORVASC ) 10 MG tablet Take 1 tablet (10 mg total) by mouth daily. 90 tablet 2   apixaban  (ELIQUIS ) 5 MG TABS tablet Take 1 tablet (5 mg total) by mouth 2 (two) times daily. 180 tablet 1   Ascorbic Acid (VITAMIN C) 1000 MG tablet Take 1,000 mg by mouth in the morning.     atorvastatin  (LIPITOR) 10 MG tablet Take 1 tablet (10 mg total) by mouth daily. 90 tablet 2   b complex vitamins capsule Take 1 capsule by mouth in the morning.     Biotin 5000 MCG TABS Take 5,000 mcg by mouth in the morning.     cholecalciferol (VITAMIN D) 25 MCG (1000 UNIT) tablet Take 1,000 Units by mouth daily as needed (with vertigo).     citalopram  (CELEXA ) 20 MG tablet Take 20 mg by mouth at bedtime.     cloNIDine  (CATAPRES ) 0.3 MG tablet Take 0.3 mg by mouth 2 (two) times daily.     cyclobenzaprine (FLEXERIL) 10 MG tablet Take 10 mg by mouth as needed for muscle spasms.     gabapentin  (NEURONTIN ) 800 MG tablet Take 1 tablet (800 mg total) by mouth 2 (two)  times daily. 180 tablet 3   hydrALAZINE  (APRESOLINE ) 25 MG tablet Take 1 tablet (25 mg total) by mouth 3 (three) times daily. 270 tablet 3   indapamide  (LOZOL ) 2.5 MG tablet Take 1 tablet (2.5 mg total) by mouth daily. 90 tablet 3   losartan  (COZAAR ) 50 MG tablet Take 1 tablet (50 mg total) by mouth daily. 90 tablet 3   meclizine  (ANTIVERT ) 25 MG tablet Take 25 mg by mouth 3 (three) times daily as needed (vertigo).     methocarbamol  (ROBAXIN ) 500 MG tablet 2 tab(s) orally 3 times a day AS NEEDED     metoprolol  succinate (TOPROL -XL) 100 MG 24 hr tablet Take 1.5 tablets (150 mg total) by mouth daily. Take with or immediately following a meal. 135 tablet 3   Multiple Vitamin (MULTIVITAMIN  WITH MINERALS) TABS tablet Take 1 tablet by mouth in the morning. 50+     naproxen (NAPROSYN) 375 MG tablet Take 375 mg by mouth 2 (two) times daily as needed.     ondansetron  (ZOFRAN ) 8 MG tablet Take 8 mg by mouth 3 (three) times daily as needed for nausea or vomiting.     pantoprazole  (PROTONIX ) 40 MG tablet Take 40 mg by mouth daily before breakfast.     potassium chloride  SA (KLOR-CON  M20) 20 MEQ tablet Take 1 tablet (20 mEq total) by mouth 2 (two) times daily. (Patient taking differently: Take 20 mEq by mouth daily.) 180 tablet 3   spironolactone  (ALDACTONE ) 25 MG tablet Take 1 tablet (25 mg total) by mouth daily. Needs appt with Dr Santo for further refills 90 tablet 3   traMADol  (ULTRAM ) 50 MG tablet Take 1 tablet (50 mg total) by mouth every 6 (six) hours as needed for severe pain. 25 tablet 1   Vitamin A 2400 MCG (8000 UT) CAPS Take 2,400 mcg by mouth in the morning.     vitamin B-12 (CYANOCOBALAMIN ) 1000 MCG tablet Take 1,000 mcg by mouth 2 (two) times a week.     vitamin E 180 MG (400 UNITS) capsule Take 400 Units by mouth in the morning.     No facility-administered medications prior to visit.    PAST MEDICAL HISTORY: Past Medical History:  Diagnosis Date   Anxiety    Aortic aneurysm  without rupture    followed by dr dr h. claudene,  per echo 08/ 2021 44mm and chest CT 03/ 2021 4.0.cm   Arthritis    lower back   BPPV (benign paroxysmal positional vertigo)    Chronic back pain    CKD (chronic kidney disease), stage II    Depression    GERD (gastroesophageal reflux disease)    Headache    Hemiparesis affecting left side as late effect of cerebrovascular accident (HCC)    Hiatal hernia    History of anemia    History of cerebrovascular accident (CVA) with residual deficit 03/2006   (neurologist--- dr buck) admission in epic, acute right thalamic hypertensive hemorrhage w/ left sided hemiparesis   History of gastric ulcer 2003   w/ upper gi bleed,  egd with no intervention needed   Hyperlipidemia    Hypertension    followed by cardiology--- dr h. claudene and HTN clinic   (nuclear stress test 01-31-20121 normal w/ ef 54%; cardaic cath 08-07-2010 no sig coronary obstruction, preserved LVF, mild AR   Multinodular goiter    followed by pcp,  last ultrasound in epic 03-20-2020 , never met critiria for bx   Nonrheumatic aortic (valve) insufficiency    followed by cardiologist--- dr h. claudene,  last echo in epic 08/ 2021 ef 60-65%, mild AR with ava 1.81cm^2 and mean gradient 11.69mmHg   OAB (overactive bladder)    urologist--- dr gaston   Pneumonia    as a child   Spondylolisthesis, lumbar region    Stroke [facility])    left side weakness 2007   Subcutaneous mass of back    upper back   Thalamic pain syndrome    secondary to acute right thalamic hypertensive hemorrhage in 2007,  followed by dr buck   Urge incontinence    Wears glasses    Wears hearing aid in both ears    Wears partial dentures    upper and lower    PAST SURGICAL HISTORY: Past Surgical History:  Procedure Laterality Date  ABDOMINAL HYSTERECTOMY     ATRIAL FIBRILLATION ABLATION N/A 02/07/2023   Procedure: ATRIAL FIBRILLATION ABLATION;  Surgeon: Cindie Ole DASEN, MD;  Location: [facility] INVASIVE CV LAB;   Service: Cardiovascular;  Laterality: N/A;   CARDIAC CATHETERIZATION  08/07/2010   @ [facility]  dr morris---  no sig coronary obstruction, preserved LVF, mild AR   COLON RESECTION SIGMOID N/A 11/08/2021   Procedure: COLON RESECTION SIGMOID; DRAINAGE OF INTRAABDOMINAL ABSCESS;  Surgeon: Sebastian Moles, MD;  Location: [facility] OR;  Service: General;  Laterality: N/A;   COLOSTOMY N/A 11/08/2021   Procedure: COLOSTOMY;  Surgeon: Sebastian Moles, MD;  Location: [facility] OR;  Service: General;  Laterality: N/A;   COLOSTOMY REVERSAL N/A 05/17/2022   Procedure: COLOSTOMY REVERSAL (HARTMAN);  Surgeon: Sebastian Moles, MD;  Location: [facility] OR;  Service: General;  Laterality: N/A;   MASS EXCISION N/A 06/04/2021   Procedure: EXCISION SUCUTANEOUS MASS X2, UPPER BACK;  Surgeon: Signe Mitzie LABOR, MD;  Location: [facility] OR;  Service: General;  Laterality: N/A;   TOTAL KNEE ARTHROPLASTY Left 01/05/2016   Procedure: TOTAL KNEE ARTHROPLASTY;  Surgeon: Dempsey Sensor, MD;  Location: [facility] OR;  Service: Orthopedics;  Laterality: Left;   TUBAL LIGATION Bilateral 2000    FAMILY HISTORY: Family History  Problem Relation Age of Onset   Hypertension Mother        deceased   Peripheral vascular disease Mother    Cirrhosis Father        DeCEASED   Cancer Brother    Drug abuse Brother        overdose, in 1 brother   Heart attack Other        grandmother deceased   Breast cancer Neg Hx    Stroke Neg Hx     SOCIAL HISTORY: Social History   Socioeconomic History   Marital status: Divorced    Spouse name: Not on file   Number of children: 3   Years of education: 11   Highest education level: Not on file  Occupational History   Occupation: child care    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  Tobacco Use   Smoking status: Former    Current packs/day: 0.00    Average packs/day: 1 pack/day for 50.0 years (50.0 ttl pk-yrs)    Types: Cigarettes    Start date: 10/30/1971    Quit date: 10/29/2021    Years since  quitting: 2.8   Smokeless tobacco: Never   Tobacco comments:    Former smoker 03/07/23  Vaping Use   Vaping status: Never Used  Substance and Sexual Activity   Alcohol use: No    Alcohol/week: 0.0 standard drinks of alcohol   Drug use: No   Sexual activity: Never  Other Topics Concern   Not on file  Social History Narrative   Patient consumes no caffeine   Social Drivers of Corporate investment banker Strain: Low Risk  (05/16/2023)   Overall Financial Resource Strain (CARDIA)    Difficulty of Paying Living Expenses: Not hard at all  Food Insecurity: No Food Insecurity (05/16/2023)   Hunger Vital Sign    Worried About Running Out of Food in the Last Year: Never true    Ran Out of Food in the Last Year: Never true  Transportation Needs: No Transportation Needs (05/16/2023)   PRAPARE - Administrator, Civil Service (Medical): No    Lack of Transportation (Non-Medical): No  Physical Activity: Not on file  Stress: No Stress Concern  Present (05/16/2023)   [employer] of Occupational Health - Occupational Stress Questionnaire    Feeling of Stress : Not at all  Social Connections: Not on file  Intimate Partner Violence: Not on file      PHYSICAL EXAM  There were no vitals filed for this visit.   There is no height or weight on file to calculate BMI.  Generalized: Well developed, in no acute distress   Neurological examination  Mentation: Alert oriented to time, place, history taking. Follows all commands speech and language fluent Cranial nerve II-XII: Pupils were equal round reactive to light. Extraocular movements were full, visual field were full on confrontational test. Facial sensation and strength were normal.  Head turning and shoulder shrug  were normal and symmetric. Motor: The motor testing reveals 5 over 5 strength of all 4 extremities slightly weaker in the LUE. Good symmetric motor tone is noted throughout.  Sensory: Sensory testing is intact to  soft touch on all 4 extremities. No evidence of extinction is noted.  Coordination: Cerebellar testing reveals good finger-nose-finger on the right difficulty on the left. Good heel-to-shin bilaterally.  Gait and station: Gait is normal.  Uses a cane when ambulating Reflexes: Deep tendon reflexes are symmetric and normal bilaterally.   DIAGNOSTIC DATA (LABS, IMAGING, TESTING) - I reviewed patient records, labs, notes, testing and imaging myself where available.  Lab Results  Component Value Date   WBC 5.2 01/17/2023   HGB 13.4 01/17/2023   HCT 39.9 01/17/2023   MCV 88 01/17/2023   PLT 313 01/17/2023      Component Value Date/Time   NA 143 05/24/2024 1441   K 4.1 05/24/2024 1441   CL 104 05/24/2024 1441   CO2 23 05/24/2024 1441   GLUCOSE 74 05/24/2024 1441   GLUCOSE 91 02/07/2023 0922   BUN 16 05/24/2024 1441   CREATININE 1.40 (H) 05/24/2024 1441   CREATININE 1.11 (H) 04/20/2016 1409   CALCIUM  9.5 05/24/2024 1441   PROT 7.3 04/16/2022 2305   PROT 6.8 08/11/2020 1058   ALBUMIN 3.5 04/16/2022 2305   ALBUMIN 3.8 08/11/2020 1058   AST 18 04/16/2022 2305   ALT 12 04/16/2022 2305   ALKPHOS 88 04/16/2022 2305   BILITOT 0.4 04/16/2022 2305   BILITOT 0.4 08/11/2020 1058   GFRNONAA 48 (L) 02/07/2023 0922   GFRAA 42 (L) 10/22/2020 1352   Lab Results  Component Value Date   CHOL 134 04/20/2016   HDL 54 04/20/2016   LDLCALC 60 04/20/2016   TRIG 99 04/20/2016   CHOLHDL 2.5 04/20/2016   Lab Results  Component Value Date   HGBA1C 6.0 (H) 11/08/2021   Lab Results  Component Value Date   VITAMINB12 2,225 (H) 02/12/2022   Lab Results  Component Value Date   TSH 1.420  12/05/2009      ASSESSMENT AND PLAN 73 y.o. year old female  has a past medical history of Anxiety, Aortic aneurysm without rupture, Arthritis, BPPV (benign paroxysmal positional vertigo), Chronic back pain, CKD (chronic kidney disease), stage II, Depression, GERD (gastroesophageal reflux disease), Headache,  Hemiparesis affecting left side as late effect of cerebrovascular accident (HCC), Hiatal hernia, History of anemia, History of cerebrovascular accident (CVA) with residual deficit (03/2006), History of gastric ulcer (2003), Hyperlipidemia, Hypertension, Multinodular goiter, Nonrheumatic aortic (valve) insufficiency, OAB (overactive bladder), Pneumonia, Spondylolisthesis, lumbar region, Stroke (HCC), Subcutaneous mass of back, Thalamic pain syndrome, Urge incontinence, Wears glasses, Wears hearing aid in both ears, and Wears partial dentures. here with:  1.  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  Abnormality of gait and balance  --Continue gabapentin  800 mg twice a day --Advised if she begins to have frequent falls she should let us  know --Follow-up in 1 year or sooner if needed     Duwaine Russell, MSN, NP-C 09/09/2024, 8:48 PM [facility] [address] [place], [place] [address] [phone]

## 2024-09-10 ENCOUNTER — Ambulatory Visit: Admitting: Adult Health

## 2024-09-10 ENCOUNTER — Encounter: Payer: Self-pay | Admitting: Adult Health

## 2024-09-10 VITALS — BP 130/78 | HR 87 | Ht 66.0 in | Wt 185.6 lb

## 2024-09-10 DIAGNOSIS — R279 Unspecified lack of coordination: Secondary | ICD-10-CM

## 2024-09-10 DIAGNOSIS — I69398 Other sequelae of cerebral infarction: Secondary | ICD-10-CM | POA: Diagnosis not present

## 2024-09-10 DIAGNOSIS — G890 Central pain syndrome: Secondary | ICD-10-CM

## 2024-09-10 MED ORDER — GABAPENTIN 600 MG PO TABS: 600.0000 mg | ORAL_TABLET | Freq: Two times a day (BID) | ORAL | 11 refills | Status: AC

## 2024-09-10 NOTE — Progress Notes (Signed)
 Sue Lush, RN; Kathyrn Sheriff; Santina Evans; Jeris Penta, [place]; 1 other Received, Thank you

## 2024-09-10 NOTE — Patient Instructions (Signed)
 Your Plan:  Reduce gabapentin  to 600 mg twice a day If your symptoms worsen or you develop new symptoms please let us  know.    Thank you for coming to see us  at [facility]. I hope we have been able to provide you high quality care today.  You may receive a patient satisfaction survey over the next few weeks. We would appreciate your feedback and comments so that we may continue to improve ourselves and the health of our patients.

## 2024-09-10 NOTE — Progress Notes (Signed)
 DME Rollator order sent to [facility].

## 2024-09-13 DIAGNOSIS — I480 Paroxysmal atrial fibrillation: Secondary | ICD-10-CM | POA: Diagnosis not present

## 2024-11-09 ENCOUNTER — Other Ambulatory Visit: Payer: Self-pay

## 2024-11-09 DIAGNOSIS — I712 Thoracic aortic aneurysm, without rupture, unspecified: Secondary | ICD-10-CM

## 2024-12-04 ENCOUNTER — Telehealth: Payer: Self-pay | Admitting: Internal Medicine

## 2024-12-04 NOTE — Telephone Encounter (Signed)
 Called pt, gave pt the following number to reschedule her CT scan: Chelsea [name], [phone].

## 2024-12-04 NOTE — Telephone Encounter (Signed)
 Pt requesting c/b to reschedule CT. Please advise.

## 2024-12-05 ENCOUNTER — Ambulatory Visit (HOSPITAL_COMMUNITY)

## 2024-12-11 ENCOUNTER — Ambulatory Visit

## 2024-12-11 ENCOUNTER — Ambulatory Visit (HOSPITAL_COMMUNITY)
Admission: RE | Admit: 2024-12-11 | Discharge: 2024-12-11 | Disposition: A | Discharge status: Home / Self Care | Source: Ambulatory Visit

## 2024-12-11 DIAGNOSIS — I712 Thoracic aortic aneurysm, without rupture, unspecified: Secondary | ICD-10-CM | POA: Diagnosis present

## 2024-12-11 MED ORDER — IOHEXOL 350 MG/ML SOLN
75.0000 mL | Freq: Once | INTRAVENOUS | Status: AC | PRN
  Administered 2024-12-11: 75 mL via INTRAVENOUS

## 2024-12-12 ENCOUNTER — Ambulatory Visit

## 2024-12-13 NOTE — Progress Notes (Signed)
 No Show

## 2024-12-25 ENCOUNTER — Ambulatory Visit: Admitting: Gastroenterology

## 2024-12-31 ENCOUNTER — Ambulatory Visit: Admitting: Family Medicine

## 2025-01-17 ENCOUNTER — Ambulatory Visit: Admitting: Pediatrics

## 2025-02-11 ENCOUNTER — Ambulatory Visit: Admitting: Family Medicine

## 2025-09-23 ENCOUNTER — Ambulatory Visit: Admitting: Adult Health
